# Patient Record
Sex: Male | Born: 1945 | Race: White | Hispanic: No | Marital: Single | Smoking: Current every day smoker
Health system: Southern US, Community
[De-identification: ages and names within clinical notes are randomized; demographics above are authoritative.]

## PROBLEM LIST (undated history)

## (undated) DIAGNOSIS — Z720 Tobacco use: Secondary | ICD-10-CM

## (undated) DIAGNOSIS — F32A Depression, unspecified: Secondary | ICD-10-CM

## (undated) DIAGNOSIS — I77819 Aortic ectasia, unspecified site: Secondary | ICD-10-CM

## (undated) DIAGNOSIS — I2510 Atherosclerotic heart disease of native coronary artery without angina pectoris: Secondary | ICD-10-CM

## (undated) DIAGNOSIS — I10 Essential (primary) hypertension: Secondary | ICD-10-CM

## (undated) DIAGNOSIS — I350 Nonrheumatic aortic (valve) stenosis: Secondary | ICD-10-CM

## (undated) DIAGNOSIS — E785 Hyperlipidemia, unspecified: Secondary | ICD-10-CM

## (undated) DIAGNOSIS — I255 Ischemic cardiomyopathy: Secondary | ICD-10-CM

## (undated) DIAGNOSIS — E119 Type 2 diabetes mellitus without complications: Secondary | ICD-10-CM

## (undated) DIAGNOSIS — J449 Chronic obstructive pulmonary disease, unspecified: Secondary | ICD-10-CM

## (undated) DIAGNOSIS — I480 Paroxysmal atrial fibrillation: Secondary | ICD-10-CM

## (undated) DIAGNOSIS — I509 Heart failure, unspecified: Secondary | ICD-10-CM

## (undated) DIAGNOSIS — N183 Chronic kidney disease, stage 3 unspecified: Secondary | ICD-10-CM

## (undated) DIAGNOSIS — K219 Gastro-esophageal reflux disease without esophagitis: Secondary | ICD-10-CM

## (undated) DIAGNOSIS — F329 Major depressive disorder, single episode, unspecified: Secondary | ICD-10-CM

## (undated) DIAGNOSIS — I739 Peripheral vascular disease, unspecified: Secondary | ICD-10-CM

## (undated) DIAGNOSIS — E0590 Thyrotoxicosis, unspecified without thyrotoxic crisis or storm: Secondary | ICD-10-CM

## (undated) DIAGNOSIS — I5022 Chronic systolic (congestive) heart failure: Secondary | ICD-10-CM

## (undated) DIAGNOSIS — I77810 Thoracic aortic ectasia: Secondary | ICD-10-CM

## (undated) HISTORY — DX: Thoracic aortic ectasia: I77.810

## (undated) HISTORY — PX: CHOLECYSTECTOMY: SHX55

## (undated) HISTORY — DX: Heart failure, unspecified: I50.9

## (undated) HISTORY — DX: Ischemic cardiomyopathy: I25.5

## (undated) HISTORY — DX: Nonrheumatic aortic (valve) stenosis: I35.0

## (undated) HISTORY — PX: COLONOSCOPY: SHX174

## (undated) HISTORY — PX: CARDIAC CATHETERIZATION: SHX172

## (undated) HISTORY — DX: Aortic ectasia, unspecified site: I77.819

## (undated) HISTORY — PX: OTHER SURGICAL HISTORY: SHX169

## (undated) HISTORY — PX: CORONARY ARTERY BYPASS GRAFT: SHX141

## (undated) HISTORY — DX: Chronic systolic (congestive) heart failure: I50.22

---

## 2019-01-26 ENCOUNTER — Emergency Department: Payer: Medicare Other

## 2019-01-26 ENCOUNTER — Inpatient Hospital Stay
Admission: EM | Admit: 2019-01-26 | Discharge: 2019-01-28 | DRG: 303 | Disposition: A | Discharge status: Skilled Nursing Facility | Payer: Medicare Other | Source: Skilled Nursing Facility | Attending: Internal Medicine | Admitting: Internal Medicine

## 2019-01-26 ENCOUNTER — Other Ambulatory Visit: Payer: Self-pay

## 2019-01-26 ENCOUNTER — Observation Stay (HOSPITAL_BASED_OUTPATIENT_CLINIC_OR_DEPARTMENT_OTHER)
Admit: 2019-01-26 | Discharge: 2019-01-26 | Disposition: A | Discharge status: Home / Self Care | Payer: Medicare Other | Attending: Internal Medicine | Admitting: Internal Medicine

## 2019-01-26 DIAGNOSIS — D649 Anemia, unspecified: Secondary | ICD-10-CM | POA: Diagnosis present

## 2019-01-26 DIAGNOSIS — Z89612 Acquired absence of left leg above knee: Secondary | ICD-10-CM

## 2019-01-26 DIAGNOSIS — N1831 Chronic kidney disease, stage 3a: Secondary | ICD-10-CM

## 2019-01-26 DIAGNOSIS — J449 Chronic obstructive pulmonary disease, unspecified: Secondary | ICD-10-CM | POA: Diagnosis present

## 2019-01-26 DIAGNOSIS — N289 Disorder of kidney and ureter, unspecified: Secondary | ICD-10-CM

## 2019-01-26 DIAGNOSIS — N183 Chronic kidney disease, stage 3 (moderate): Secondary | ICD-10-CM | POA: Diagnosis present

## 2019-01-26 DIAGNOSIS — E1122 Type 2 diabetes mellitus with diabetic chronic kidney disease: Secondary | ICD-10-CM | POA: Diagnosis present

## 2019-01-26 DIAGNOSIS — R079 Chest pain, unspecified: Secondary | ICD-10-CM

## 2019-01-26 DIAGNOSIS — F17210 Nicotine dependence, cigarettes, uncomplicated: Secondary | ICD-10-CM | POA: Diagnosis present

## 2019-01-26 DIAGNOSIS — E1151 Type 2 diabetes mellitus with diabetic peripheral angiopathy without gangrene: Secondary | ICD-10-CM | POA: Diagnosis present

## 2019-01-26 DIAGNOSIS — F419 Anxiety disorder, unspecified: Secondary | ICD-10-CM | POA: Diagnosis present

## 2019-01-26 DIAGNOSIS — I252 Old myocardial infarction: Secondary | ICD-10-CM

## 2019-01-26 DIAGNOSIS — R001 Bradycardia, unspecified: Secondary | ICD-10-CM | POA: Diagnosis present

## 2019-01-26 DIAGNOSIS — Z7982 Long term (current) use of aspirin: Secondary | ICD-10-CM

## 2019-01-26 DIAGNOSIS — Z7984 Long term (current) use of oral hypoglycemic drugs: Secondary | ICD-10-CM

## 2019-01-26 DIAGNOSIS — E876 Hypokalemia: Secondary | ICD-10-CM | POA: Diagnosis present

## 2019-01-26 DIAGNOSIS — Z79899 Other long term (current) drug therapy: Secondary | ICD-10-CM

## 2019-01-26 DIAGNOSIS — Z951 Presence of aortocoronary bypass graft: Secondary | ICD-10-CM

## 2019-01-26 DIAGNOSIS — E785 Hyperlipidemia, unspecified: Secondary | ICD-10-CM | POA: Diagnosis present

## 2019-01-26 DIAGNOSIS — K219 Gastro-esophageal reflux disease without esophagitis: Secondary | ICD-10-CM | POA: Diagnosis present

## 2019-01-26 DIAGNOSIS — R0789 Other chest pain: Secondary | ICD-10-CM

## 2019-01-26 DIAGNOSIS — Z7951 Long term (current) use of inhaled steroids: Secondary | ICD-10-CM

## 2019-01-26 DIAGNOSIS — Z7901 Long term (current) use of anticoagulants: Secondary | ICD-10-CM

## 2019-01-26 DIAGNOSIS — I25118 Atherosclerotic heart disease of native coronary artery with other forms of angina pectoris: Principal | ICD-10-CM | POA: Diagnosis present

## 2019-01-26 DIAGNOSIS — F329 Major depressive disorder, single episode, unspecified: Secondary | ICD-10-CM | POA: Diagnosis present

## 2019-01-26 DIAGNOSIS — I480 Paroxysmal atrial fibrillation: Secondary | ICD-10-CM | POA: Diagnosis present

## 2019-01-26 DIAGNOSIS — I129 Hypertensive chronic kidney disease with stage 1 through stage 4 chronic kidney disease, or unspecified chronic kidney disease: Secondary | ICD-10-CM | POA: Diagnosis present

## 2019-01-26 DIAGNOSIS — Z8249 Family history of ischemic heart disease and other diseases of the circulatory system: Secondary | ICD-10-CM

## 2019-01-26 HISTORY — DX: Paroxysmal atrial fibrillation: I48.0

## 2019-01-26 HISTORY — DX: Chronic obstructive pulmonary disease, unspecified: J44.9

## 2019-01-26 HISTORY — DX: Tobacco use: Z72.0

## 2019-01-26 HISTORY — DX: Gastro-esophageal reflux disease without esophagitis: K21.9

## 2019-01-26 HISTORY — DX: Depression, unspecified: F32.A

## 2019-01-26 HISTORY — DX: Type 2 diabetes mellitus without complications: E11.9

## 2019-01-26 HISTORY — DX: Atherosclerotic heart disease of native coronary artery without angina pectoris: I25.10

## 2019-01-26 HISTORY — DX: Peripheral vascular disease, unspecified: I73.9

## 2019-01-26 HISTORY — DX: Chronic kidney disease, stage 3 unspecified: N18.30

## 2019-01-26 HISTORY — DX: Hyperlipidemia, unspecified: E78.5

## 2019-01-26 HISTORY — DX: Chronic kidney disease, stage 3 (moderate): N18.3

## 2019-01-26 HISTORY — DX: Thyrotoxicosis, unspecified without thyrotoxic crisis or storm: E05.90

## 2019-01-26 HISTORY — DX: Essential (primary) hypertension: I10

## 2019-01-26 HISTORY — DX: Major depressive disorder, single episode, unspecified: F32.9

## 2019-01-26 LAB — CBC
HCT: 39.9 % (ref 39.0–52.0)
Hemoglobin: 13 g/dL (ref 13.0–17.0)
MCH: 28.6 pg (ref 26.0–34.0)
MCHC: 32.6 g/dL (ref 30.0–36.0)
MCV: 87.7 fL (ref 80.0–100.0)
Platelets: 221 10*3/uL (ref 150–400)
RBC: 4.55 MIL/uL (ref 4.22–5.81)
RDW: 14.7 % (ref 11.5–15.5)
WBC: 10 10*3/uL (ref 4.0–10.5)
nRBC: 0 % (ref 0.0–0.2)

## 2019-01-26 LAB — BASIC METABOLIC PANEL
Anion gap: 10 (ref 5–15)
BUN: 20 mg/dL (ref 8–23)
CO2: 24 mmol/L (ref 22–32)
Calcium: 8.5 mg/dL — ABNORMAL LOW (ref 8.9–10.3)
Chloride: 105 mmol/L (ref 98–111)
Creatinine, Ser: 1.29 mg/dL — ABNORMAL HIGH (ref 0.61–1.24)
GFR calc Af Amer: >60 mL/min (ref >60)
GFR calc non Af Amer: 54 mL/min — ABNORMAL LOW (ref >60)
Glucose, Bld: 199 mg/dL — ABNORMAL HIGH (ref 70–99)
Potassium: 3.6 mmol/L (ref 3.5–5.1)
Sodium: 139 mmol/L (ref 135–145)

## 2019-01-26 LAB — TROPONIN I
Troponin I: <0.03 ng/mL (ref <0.03)
Troponin I: <0.03 ng/mL (ref <0.03)
Troponin I: <0.03 ng/mL (ref <0.03)

## 2019-01-26 LAB — GLUCOSE, CAPILLARY
Glucose-Capillary: 99 mg/dL (ref 70–99)
Glucose-Capillary: 200 mg/dL — ABNORMAL HIGH (ref 70–99)

## 2019-01-26 LAB — ECHOCARDIOGRAM COMPLETE
Height: 72 in
Weight: 2656 oz

## 2019-01-26 MED ORDER — ASPIRIN 300 MG RE SUPP: 300.0000 mg | RECTAL | Status: DC

## 2019-01-26 MED ORDER — MORPHINE SULFATE (PF) 2 MG/ML IV SOLN
2.0000 mg | Freq: Once | INTRAVENOUS | Status: AC
  Administered 2019-01-26: 14:00:00 2 mg via INTRAVENOUS

## 2019-01-26 MED ORDER — INSULIN ASPART 100 UNIT/ML ~~LOC~~ SOLN
0.0000 [IU] | Freq: Every day | SUBCUTANEOUS | Status: DC
  Filled 2019-01-26: qty 1

## 2019-01-26 MED ORDER — NITROGLYCERIN 2 % TD OINT
1.0000 [in_us] | TOPICAL_OINTMENT | Freq: Once | TRANSDERMAL | Status: AC
  Administered 2019-01-26: 1 [in_us] via TOPICAL
  Filled 2019-01-26: qty 1

## 2019-01-26 MED ORDER — ASPIRIN EC 81 MG PO TBEC
81.0000 mg | DELAYED_RELEASE_TABLET | Freq: Every day | ORAL | Status: DC
  Administered 2019-01-27: 81 mg via ORAL
  Filled 2019-01-26: qty 1

## 2019-01-26 MED ORDER — INSULIN ASPART 100 UNIT/ML ~~LOC~~ SOLN
0.0000 [IU] | Freq: Three times a day (TID) | SUBCUTANEOUS | Status: DC
  Administered 2019-01-27 – 2019-01-28 (×4): 2 [IU] via SUBCUTANEOUS
  Filled 2019-01-26 (×4): qty 1

## 2019-01-26 MED ORDER — MORPHINE SULFATE (PF) 2 MG/ML IV SOLN
INTRAVENOUS | Status: AC
  Administered 2019-01-26: 2 mg via INTRAVENOUS
  Filled 2019-01-26: qty 1

## 2019-01-26 MED ORDER — ONDANSETRON HCL 4 MG/2ML IJ SOLN: 4.0000 mg | Freq: Four times a day (QID) | INTRAMUSCULAR | Status: DC | PRN

## 2019-01-26 MED ORDER — SODIUM CHLORIDE 0.9 % IV SOLN: 250.0000 mL | INTRAVENOUS | Status: DC | PRN

## 2019-01-26 MED ORDER — ASPIRIN 81 MG PO CHEW
324.0000 mg | CHEWABLE_TABLET | Freq: Once | ORAL | Status: AC
  Administered 2019-01-26: 12:00:00 324 mg via ORAL
  Filled 2019-01-26: qty 4

## 2019-01-26 MED ORDER — SODIUM CHLORIDE 0.9% FLUSH
3.0000 mL | Freq: Two times a day (BID) | INTRAVENOUS | Status: DC
  Administered 2019-01-26 – 2019-01-28 (×3): 3 mL via INTRAVENOUS

## 2019-01-26 MED ORDER — SODIUM CHLORIDE 0.9% FLUSH: 3.0000 mL | INTRAVENOUS | Status: DC | PRN

## 2019-01-26 MED ORDER — ASPIRIN 81 MG PO CHEW: 324.0000 mg | CHEWABLE_TABLET | ORAL | Status: DC

## 2019-01-26 MED ORDER — ENOXAPARIN SODIUM 40 MG/0.4ML ~~LOC~~ SOLN
40.0000 mg | SUBCUTANEOUS | Status: DC
  Administered 2019-01-26 – 2019-01-27 (×2): 40 mg via SUBCUTANEOUS
  Filled 2019-01-26 (×2): qty 0.4

## 2019-01-26 MED ORDER — ATORVASTATIN CALCIUM 20 MG PO TABS
80.0000 mg | ORAL_TABLET | Freq: Every day | ORAL | Status: DC
  Administered 2019-01-26: 80 mg via ORAL
  Filled 2019-01-26: qty 4

## 2019-01-26 MED ORDER — NITROGLYCERIN 0.4 MG SL SUBL: 0.4000 mg | SUBLINGUAL_TABLET | SUBLINGUAL | Status: DC | PRN

## 2019-01-26 MED ORDER — ACETAMINOPHEN 325 MG PO TABS: 650.0000 mg | ORAL_TABLET | ORAL | Status: DC | PRN

## 2019-01-26 NOTE — Consult Note (Addendum)
Cardiology Consult    Patient ID: Shawn Jennings MRN: [id number], DOB/AGE: 1945/11/18   Admit date: 01/26/2019 Date of Consult: 01/26/2019  Primary Physician: Orvis Brill, [facility] Primary Cardiologist: [name], MD - new Requesting Provider: Mamie Nick Pyreddy  Patient Profile    Shawn Jennings is a 73 y.o. male with a history of CAD s/p CABG in 1994, chronic chest pain, HTN, HL, ? PAF, PAD s/p L AKA, DMII, GERD, and depression, who is being seen today for the evaluation of chest pain at the request of Dr. Estanislado Pandy.  Past Medical History   Past Medical History:  Diagnosis Date   CAD (coronary artery disease)    a. 1994 s/p CABG ([place], [place]); b. Reports multiple stress tests over the years.   CKD (chronic kidney disease), stage III (HCC)    COPD (chronic obstructive pulmonary disease) (HCC)    Depression    Essential hypertension    GERD (gastroesophageal reflux disease)    Hyperlipidemia LDL goal <70    PAD (peripheral artery disease) ([place])    a. 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   PAF (paroxysmal atrial fibrillation) (HCC)    a. CHA2DS2VASc = 4-->Xarelto 15mg  daily in setting of CKD.   Thyrotoxicosis    Tobacco abuse    Type II diabetes mellitus ([place])     Past Surgical History:  Procedure Laterality Date   CORONARY ARTERY BYPASS GRAFT     a. [place], [place]   Left AKA      Allergies  No Known Allergies  History of Present Illness    73 y/o ? with a h/o CAD s/p remote CABG in [place], [place] in 1994.  Other history includes HTN, HL, DMII, COPD, tobacco abuse, possibly PAF (he isn't sure but is on xarelto), PAD s/p L AKA, GERD, depression, and CKD.  He says that he has a long h/o chest pain and has had many stress tests over the years but has never required a repeat catheterization.  He continues to smoke 1 ppd and lives in an assisted living in [place], [place].  He initially told me that he had a stress test in [place] ~ 2 months ago, just prior to moving to [facility].  Upon further  questioning however, he says that he moved to [place] ~ 9 yrs ago.  He thinks he had a stress test 2 mos ago but he doesn't know where he might have had it.  No records have been found from any of our other local health systems in [place] (none from [place], [place] either).  Pt is w/c bound.  He notes that he is accustomed to experiencing chest pressure at least once/wk, usually while lying in bed, sometimes assoc w/ dyspnea.  He often uses sl ntg, and says that Ss typically last about 64mins after using ntg.  This AM, he awoke @ 4am w/ severe sscp/pressure, assoc w/ dyspnea.  He says that he alerted staff @ the assisted living facility. However, EMS was not called until about 11am per notes.  Pt says he's had pain since 4am, however ER note indicates that he was pain free in the ED.  Further, ED nurse spoke w/ staff @ [facility], who reported reason for ED visit was cough, dyspnea, and fever.  He was given ASA 324mg  in the ED along w/ ntp and morphine.  ECG w/ lateral TWI (no old for comparison).  Initial trop nl.  He was admitted for further eval. He says pain is still 7/10 and  has now been present for ~ 12 hrs.  He is in no distress and overall good spirits.    Inpatient Medications     [START ON 01/27/2019] aspirin EC  81 mg Oral Daily   enoxaparin (LOVENOX) injection  40 mg Subcutaneous Q24H   insulin aspart  0-15 Units Subcutaneous TID WC   insulin aspart  0-5 Units Subcutaneous QHS   sodium chloride flush  3 mL Intravenous Q12H    Family History    Family History  Problem Relation Age of Onset   Heart attack Mother        died @ 58.   Other Father        never knew his father.   Other Sister        overdose of sleeping pills.   Heart disease Brother        died in his 1's   Other Sister        complication of abd surgery @ 4   CAD Sister    He indicated that his mother is deceased. He indicated that his father is deceased. He indicated that only one of his three sisters  is alive. He indicated that his brother is deceased.   Social History    Social History   Socioeconomic History   Marital status: Single    Spouse name: Not on file   Number of children: Not on file   Years of education: Not on file   Highest education level: Not on file  Occupational History   Not on file  Social Needs   Financial resource strain: Not on file   Food insecurity:    Worry: Not on file    Inability: Not on file   Transportation needs:    Medical: Not on file    Non-medical: Not on file  Tobacco Use   Smoking status: Current Every Day Smoker    Packs/day: 1.00    Years: 62.00    Pack years: 62.00   Smokeless tobacco: Never Used  Substance and Sexual Activity   Alcohol use: Never    Frequency: Never   Drug use: Never   Sexual activity: Not on file  Lifestyle   Physical activity:    Days per week: Not on file    Minutes per session: Not on file   Stress: Not on file  Relationships   Social connections:    Talks on phone: Not on file    Gets together: Not on file    Attends religious service: Not on file    Active member of club or organization: Not on file    Attends meetings of clubs or organizations: Not on file    Relationship status: Not on file   Intimate partner violence:    Fear of current or ex partner: Not on file    Emotionally abused: Not on file    Physically abused: Not on file    Forced sexual activity: Not on file  Other Topics Concern   Not on file  Social History Narrative   Retired from [employer].  From [place], [place]. Moved to [place] ~ 2011 (pt initially said that he moved here in Feb 2020, but then said 9 yrs ago).     Review of Systems    General:  No chills, fever, night sweats or weight changes.  Cardiovascular:  +++ chest pain, +++ dyspnea, no edema, orthopnea, palpitations, paroxysmal nocturnal dyspnea. Dermatological: No rash, lesions/masses Respiratory: No cough, +++ dyspnea in setting  of c/p this  AM. Urologic: No hematuria, dysuria Abdominal:   No nausea, vomiting, diarrhea, bright red blood per rectum, melena, or hematemesis Neurologic:  No visual changes, wkns, changes in mental status. All other systems reviewed and are otherwise negative except as noted above.  Physical Exam    Blood pressure 111/70, pulse 65, temperature 97.9 F (36.6 C), temperature source Oral, resp. rate 18, height 6' (1.829 m), weight 75.3 kg, SpO2 99 %.  General: Pleasant, NAD Psych: Normal affect. Neuro: Alert and oriented X 3 but offers different answers to the same questions re: timing of events. Moves all extremities spontaneously. HEENT: Normal  Neck: Supple without bruits or JVD. Lungs:  Resp regular and unlabored, bibasilar crackles w/ diminished breath sounds and scatt rhonchi throughout. Heart: RRR no s3, s4. 2/6 syst murmur heard throughout.. Abdomen: Flat, Soft, non-tender, non-distended, BS + x 4.  Extremities: No clubbing, cyanosis or edema. L AKA.  R PT1+.  Labs    Troponin  Recent Labs    01/26/19 1202  TROPONINI <0.03   Lab Results  Component Value Date   WBC 10.0 01/26/2019   HGB 13.0 01/26/2019   HCT 39.9 01/26/2019   MCV 87.7 01/26/2019   PLT 221 01/26/2019    Recent Labs  Lab 01/26/19 1202  NA 139  K 3.6  CL 105  CO2 24  BUN 20  CREATININE 1.29*  CALCIUM 8.5*  GLUCOSE 199*    Radiology Studies    Dg Chest Port 1 View  Result Date: 01/26/2019 CLINICAL DATA:  Chest pain since this morning which has resolved. Diarrhea yesterday. EXAM: PORTABLE CHEST 1 VIEW COMPARISON:  01/18/2004 FINDINGS: Lungs are adequately inflated without lobar consolidation or effusion. Mild prominence of the central bronchovascular markings which may be due to an acute bronchitic process. Cardiomediastinal silhouette and remainder of the exam is unchanged. IMPRESSION: Minimal prominence of the central bronchovascular markings which may be due to an acute bronchitic process. Electronically  Signed   By: Marin Olp M.D.   On: 01/26/2019 12:21    ECG & Cardiac Imaging    RSR, 73, LAE, lateral TWI - personally reviewed.  Assessment & Plan    1.  Retrosternal Chest Pain/CAD: Pt w/ a h/o CAD s/p remote CABG in [place], [place] in 1994.  He says that he has a long h/o chest pain, at least once/wk, @ rest, lasting 30 mins, and not particularly changed w/ sl ntg.  This is sometimes assoc w/ dyspnea.  He is otw w/c bound and does not exert himself.  He says that he has had many stress tests over the years but has never required a repeat cath.  He also says that he recently had a stress test ~ 2 mos ago, but initially said it was in [place], and later said it was done locally, but doesn't know where.  No records of any recent ER or hospital visits @ other local health care systems.  He presented w/ chest pain this AM.  He tells me that it started @ 4am and has been constant since, though ED notes indicate that he presented after a 1 hr episode of chest pain that had resolved prior to arrival.  Nsg note indicates that reason for presentation was cough, fever, and dyspnea noted by ALF staff after awakening.  Regardless, despite reports of prolonged c/p, trop is currently nl.  Cont to trend troponin.  If this remains nl, it is reasonable to consider stress testing, as it is  not clear that the patient has actually undergone any cardiology evaluation recently.  Cont asa for now, though was on xarelto @ home.  2.  Essential HTN:  Stable.  Appears that he takes lasix @ home for this.  3.  HL:  On statin @ home  resume.  4.  PAD: s/p L AKA.  Resume statin.  5.  ? PAF:  Pt on xarelto 15 daily.  He doesn't know why but thinks he's been told that he's had afib.  Currently on hold in setting of chest pain admission.  On lovenox.  6.  DMII:  SSi per IM.  7.  Dementia:  Pt changes story frequently and timelines are difficult to follow.  If echo ok and no objective evidence of ischemia, he may best be served  with conservative mgmt.  8. COPD/Tob abuse:  Unclear if he is still smoking as he lives at an assisted living facility but reports 1ppd usage.  Cont inhalers.  9.  CKD (? III): pt says he's been told that his kidney's aren't nl.  Creat 1.29 today.  Follow.  Signed, Murray Hodgkins, NP 01/26/2019, 3:59 PM  For questions or updates, please contact   Please consult [email or web address] for contact info under Cardiology/STEMI.

## 2019-01-26 NOTE — ED Notes (Signed)
Spoke with nurse care coordinator from [facility] who expressed concern of cough, SOB, and fever with tylenol taken at 7 or 8am PT denies cough/SOB/CP. No cough or SOB noted. PT is afebrile upon arrival.  MD made aware of these concerns.

## 2019-01-26 NOTE — ED Notes (Signed)
ED TO INPATIENT HANDOFF REPORT  ED Nurse Name and Phone #: Jawon Dipiero [phone]  S Name/Age/Gender Shawn Jennings 73 y.o. male Room/Bed: ED09A/ED09A  Code Status   Code Status: Not on file  Home/SNF/Other Skilled nursing facility Patient oriented to: self, place, time and situation Is this baseline? Yes   Triage Complete: Triage complete  Chief Complaint chest pain ems  Triage Note PT to ED via EMS from [facility]. PT had episode of CP for about 1 hour this morning, that has completely resolved. PT had one episode of diarrhea yesterday. No SOB/cough. PT AO in NAD.    Allergies No Known Allergies  Level of Care/Admitting Diagnosis ED Disposition    ED Disposition Condition [facility] Area: [place] [[id number]]  Level of Care: Telemetry [5]  Diagnosis: Chest pain [161096]  Admitting Physician: Saundra Shelling [[id number]]  Attending Physician: Saundra Shelling [[id number]]  PT Class (Do Not Modify): Observation [104]  PT Acc Code (Do Not Modify): Observation [10022]       B Medical/Surgery History Past Medical History:  Diagnosis Date  . Depression   . Diabetes mellitus without complication ([place])   . GERD (gastroesophageal reflux disease)   . Thyrotoxicosis       A IV Location/Drains/Wounds Patient Lines/Drains/Airways Status   Active Line/Drains/Airways    None          Intake/Output Last 24 hours No intake or output data in the 24 hours ending 01/26/19 1358  Labs/Imaging Results for orders placed or performed during the hospital encounter of 01/26/19 (from the past 48 hour(s))  Basic metabolic panel     Status: Abnormal   Collection Time: 01/26/19 12:02 PM  Result Value Ref Range   Sodium 139 135 - 145 mmol/L   Potassium 3.6 3.5 - 5.1 mmol/L   Chloride 105 98 - 111 mmol/L   CO2 24 22 - 32 mmol/L   Glucose, Bld 199 (H) 70 - 99 mg/dL   BUN 20 8 - 23 mg/dL   Creatinine, Ser 1.29 (H) 0.61 - 1.24 mg/dL   Calcium 8.5 (L) 8.9 -  10.3 mg/dL   GFR calc non Af Amer 54 (L) >60 mL/min   GFR calc Af Amer >60 >60 mL/min   Anion gap 10 5 - 15    Comment: Performed at [facility], [address] [place], [place] [address]  CBC     Status: None   Collection Time: 01/26/19 12:02 PM  Result Value Ref Range   WBC 10.0 4.0 - 10.5 K/uL   RBC 4.55 4.22 - 5.81 MIL/uL   Hemoglobin 13.0 13.0 - 17.0 g/dL   HCT 39.9 39.0 - 52.0 %   MCV 87.7 80.0 - 100.0 fL   MCH 28.6 26.0 - 34.0 pg   MCHC 32.6 30.0 - 36.0 g/dL   RDW 14.7 11.5 - 15.5 %   Platelets 221 150 - 400 K/uL   nRBC 0.0 0.0 - 0.2 %    Comment: Performed at [facility], [address] [place], [place] [address]  Troponin I - Once     Status: None   Collection Time: 01/26/19 12:02 PM  Result Value Ref Range   Troponin I <0.03 <0.03 ng/mL    Comment: Performed at [facility], [address], [place], [place] [address]   Dg Chest Port 1 View  Result Date: 01/26/2019 CLINICAL DATA:  Chest pain since this morning which has resolved. Diarrhea yesterday. EXAM: PORTABLE CHEST  1 VIEW COMPARISON:  01/18/2004 FINDINGS: Lungs are adequately inflated without lobar consolidation or effusion. Mild prominence of the central bronchovascular markings which may be due to an acute bronchitic process. Cardiomediastinal silhouette and remainder of the exam is unchanged. IMPRESSION: Minimal prominence of the central bronchovascular markings which may be due to an acute bronchitic process. Electronically Signed   By: Marin Olp M.D.   On: 01/26/2019 12:21    Pending Labs [employer] (From admission, onward)    Start     Ordered   Signed and Held  CBC  (enoxaparin (LOVENOX)    CrCl >/= 30 ml/min)  Once,   R    Comments:  Baseline for enoxaparin therapy IF NOT ALREADY DRAWN.  Notify MD if PLT < 100 K.    Signed and Held   Signed and Held  Creatinine, serum  (enoxaparin (LOVENOX)    CrCl >/= 30 ml/min)  Once,   R    Comments:  Baseline for enoxaparin  therapy IF NOT ALREADY DRAWN.    Signed and Held   Signed and Held  Creatinine, serum  (enoxaparin (LOVENOX)    CrCl >/= 30 ml/min)  Weekly,   R    Comments:  while on enoxaparin therapy    Signed and Held   Signed and Held  Troponin I - Now Then Q6H  Now then every 6 hours,   STAT     Signed and Held   Signed and Held  Basic metabolic panel  Tomorrow morning,   R     Signed and Held   Signed and Held  Lipid panel  Tomorrow morning,   R     Signed and Held   Signed and Held  CBC  Tomorrow morning,   R     Signed and Held          Vitals/Pain Today's Vitals   01/26/19 1204 01/26/19 1205  BP: 114/61   Pulse: 70   Resp: 18   Temp: 98.1 F (36.7 C)   TempSrc: Oral   SpO2: 95%   Weight:  75.3 kg  Height:  6' (1.829 m)  PainSc:  0-No pain    Isolation Precautions No active isolations  Medications Medications  aspirin chewable tablet 324 mg (324 mg Oral Given 01/26/19 1222)  nitroGLYCERIN (NITROGLYN) 2 % ointment 1 inch (1 inch Topical Given 01/26/19 1222)    Mobility manual wheelchair Low fall risk   Focused Assessments Cardiac Assessment Handoff:  Cardiac Rhythm: Normal sinus rhythm Lab Results  Component Value Date   TROPONINI <0.03 01/26/2019   No results found for: DDIMER Does the Patient currently have chest pain? Yes     R Recommendations: See Admitting Provider Note  Report given to:   Additional Notes:  g

## 2019-01-26 NOTE — Progress Notes (Signed)
*  PRELIMINARY RESULTS* Echocardiogram 2D Echocardiogram has been performed.  Shawn Jennings 01/26/2019, 4:51 PM

## 2019-01-26 NOTE — ED Triage Notes (Signed)
PT to ED via EMS from [facility]. PT had episode of CP for about 1 hour this morning, that has completely resolved. PT had one episode of diarrhea yesterday. No SOB/cough. PT AO in NAD.

## 2019-01-26 NOTE — ED Provider Notes (Signed)
[facility] Emergency Department Provider Note       Time seen: ----------------------------------------- 12:02 PM on 01/26/2019 -----------------------------------------   I have reviewed the triage vital signs and the nursing notes.  HISTORY   Chief Complaint Chest Pain    HPI Shawn Jennings is a 73 y.o. male with a history of MI and CABG who presents to the ED for chest pain.  Patient describes acute onset chest pain today with shortness of breath.  He has not had sweats or nausea.  Patient reports pain with his previously associated with an MI.  He has not had aspirin or nitroglycerin today.  Currently chest pain appears to be resolved.  No past medical history on file.  There are no active problems to display for this patient.   Allergies Patient has no allergy information on record.  Social History Social History   Tobacco Use  . Smoking status: Not on file  Substance Use Topics  . Alcohol use: Not on file  . Drug use: Not on file    Review of Systems Constitutional: Negative for fever. Cardiovascular: Positive for chest pain Respiratory: Positive for shortness of breath Gastrointestinal: Negative for abdominal pain, vomiting and diarrhea. Musculoskeletal: Negative for back pain. Skin: Negative for rash. Neurological: Negative for headaches, focal weakness or numbness.  All systems negative/normal/unremarkable except as stated in the HPI  ____________________________________________   PHYSICAL EXAM:  VITAL SIGNS: ED Triage Vitals  Enc Vitals Group     BP      Pulse      Resp      Temp      Temp src      SpO2      Weight      Height      Head Circumference      Peak Flow      Pain Score      Pain Loc      Pain Edu?      Excl. in [place]?    Constitutional: Alert and oriented. Well appearing and in no distress. Eyes: Conjunctivae are normal. Normal extraocular movements. ENT      Head: Normocephalic and atraumatic.       Nose: No congestion/rhinnorhea.      Mouth/Throat: Mucous membranes are moist.      Neck: No stridor. Cardiovascular: Normal rate, regular rhythm. No murmurs, rubs, or gallops. Respiratory: Normal respiratory effort without tachypnea nor retractions. Breath sounds are clear and equal bilaterally. No wheezes/rales/rhonchi. Gastrointestinal: Soft and nontender. Normal bowel sounds Musculoskeletal: Nontender with normal range of motion in extremities. No lower extremity tenderness nor edema. Neurologic:  Normal speech and language. No gross focal neurologic deficits are appreciated.  Skin:  Skin is warm, dry and intact. No rash noted. Psychiatric: Mood and affect are normal. Speech and behavior are normal.  ____________________________________________  EKG: Interpreted by me.  Sinus rhythm rate of 73 bpm, normal PR interval, normal axis, normal QT  ____________________________________________  ED COURSE:  As part of my medical decision making, I reviewed the following data within the [place] History obtained from family if available, nursing notes, old chart and ekg, as well as notes from prior ED visits. Patient presented for chest pain and shortness of breath, we will assess with labs and imaging as indicated at this time.   Procedures Sevan Mcbroom was evaluated in Emergency Department on 01/26/2019 for the symptoms described in the history of present illness. He was evaluated in the context of the  global COVID-19 pandemic, which necessitated consideration that the patient might be at risk for infection with the SARS-CoV-2 virus that causes COVID-19. Institutional protocols and algorithms that pertain to the evaluation of patients at risk for COVID-19 are in a state of rapid change based on information released by regulatory bodies including the CDC and federal and state organizations. These policies and algorithms were followed during the patient's care in the ED.   ____________________________________________   LABS (pertinent positives/negatives)  Labs Reviewed  BASIC METABOLIC PANEL - Abnormal; Notable for the following components:      Result Value   Glucose, Bld 199 (*)    Creatinine, Ser 1.29 (*)    Calcium 8.5 (*)    GFR calc non Af Amer 54 (*)    All other components within normal limits  CBC  TROPONIN I    RADIOLOGY Images were viewed by me  Chest x-ray IMPRESSION: Minimal prominence of the central bronchovascular markings which may be due to an acute bronchitic process. ____________________________________________   DIFFERENTIAL DIAGNOSIS   Unstable angina, MI, PE, pneumothorax, pneumonia  FINAL ASSESSMENT AND PLAN  Chest pain, concerns for unstable angina   Plan: The patient had presented for chest pain resembling his prior MI. Patient's labs did not reveal any acute process. Patient's imaging was negative.  Patient has not had any respiratory symptoms, I feel he needs to be ruled out from a cardiac perspective given that his chest pain mimics his previous heart attack.  I will discuss with the hospitalist for admission.  He received aspirin and topical nitroglycerin here.   Laurence Aly, MD    Note: This note was generated in part or whole with voice recognition software. Voice recognition is usually quite accurate but there are transcription errors that can and very often do occur. I apologize for any typographical errors that were not detected and corrected.     Earleen Newport, MD 01/26/19 [phone]

## 2019-01-26 NOTE — Progress Notes (Signed)
Advanced care plan. Purpose of the Encounter: CODE STATUS Parties in Attendance: Patient Patient's Decision Capacity: Good Subjective/Patient's story: Shawn Jennings  is a 73 y.o. male with a known history of GERD, diabetes mellitus type 2, depression presented to the emergency room for chest pain.  Patient has history of MI and CABG.  Objective/Medical story Patient needs telemetry monitoring and cardiac work-up.  Needs cardiology evaluation Needs serial troponin and echocardiogram Goals of care determination:  Advance care directives goals of care and treatment plan discussed Patient wants everything done which includes CPR, intubation ventilator if the need arises CODE STATUS: Full code Time spent discussing advanced care planning: 16 minutes

## 2019-01-26 NOTE — H&P (Addendum)
[facility] [place] NAME: Shawn Jennings    MR#:  [id number]  DATE OF BIRTH:  May 13, 1946  DATE OF ADMISSION:  01/26/2019  PRIMARY CARE PHYSICIAN: [facility]   REQUESTING/REFERRING PHYSICIAN:   CHIEF COMPLAINT:   Chief Complaint  Patient presents with  . Chest Pain    HISTORY OF PRESENT ILLNESS: Shawn Jennings  is a 73 y.o. male with a known history of GERD, diabetes mellitus type 2, depression presented to the emergency room for chest pain.  Patient has history of MI and CABG. pain is located retrosternally 5 out of 10 on a scale of 1-10 sharp in nature.  First set of troponin is negative.  Hospitalist service was consulted.  PAST MEDICAL HISTORY:   Past Medical History:  Diagnosis Date  . Depression   . Diabetes mellitus without complication ([place])   . GERD (gastroesophageal reflux disease)   . Thyrotoxicosis   Hyperlipidemia Coronary artery disease Myocardial infarction CABG  PAST SURGICAL HISTORY: CABG  SOCIAL HISTORY:  Social History   Tobacco Use  . Smoking status: Smoker, Current Status Unknown    Packs/day: 1.00  . Smokeless tobacco: Never Used  Substance Use Topics  . Alcohol use: Never    Frequency: Never    FAMILY HISTORY: Mother and father deceased  DRUG ALLERGIES: No Known Allergies  REVIEW OF SYSTEMS:   CONSTITUTIONAL: No fever, fatigue or weakness.  EYES: No blurred or double vision.  EARS, NOSE, AND THROAT: No tinnitus or ear pain.  RESPIRATORY: No cough, shortness of breath, wheezing or hemoptysis.  CARDIOVASCULAR: Has chest pain, no orthopnea, edema.  GASTROINTESTINAL: No nausea, vomiting, diarrhea or abdominal pain.  GENITOURINARY: No dysuria, hematuria.  ENDOCRINE: No polyuria, nocturia,  HEMATOLOGY: No anemia, easy bruising or bleeding SKIN: No rash or lesion. MUSCULOSKELETAL: No joint pain or arthritis.   NEUROLOGIC: No tingling, numbness, weakness.  PSYCHIATRY: No  anxiety or depression.   MEDICATIONS AT HOME:  Prior to Admission medications   Medication Sig Start Date End Date Taking? Authorizing Provider  acetaminophen (TYLENOL) 500 MG tablet Take 500 mg by mouth 3 (three) times daily.   Yes [provider]  aspirin EC 81 MG tablet Take 81 mg by mouth daily.   Yes [provider]  atorvastatin (LIPITOR) 80 MG tablet Take 80 mg by mouth at bedtime.   Yes [provider]  DULoxetine (CYMBALTA) 30 MG capsule Take 30 mg by mouth 2 (two) times daily.   Yes [provider]  ferrous sulfate 325 (65 FE) MG tablet Take 325 mg by mouth daily.   Yes [provider]  Fluticasone-Salmeterol (ADVAIR) 250-50 MCG/DOSE AEPB Inhale 1 puff into the lungs 2 (two) times daily.   Yes [provider]  furosemide (LASIX) 80 MG tablet Take 80 mg by mouth 2 (two) times daily.   Yes [provider]  lamoTRIgine (LAMICTAL) 25 MG tablet Take 25 mg by mouth 2 (two) times daily.   Yes [provider]  lidocaine (LIDODERM) 5 % Cut 1 patch in half and apply to amputated stump once daily 12 hours on, 12 hours off   Yes [provider]  liver oil-zinc oxide (DESITIN) 40 % ointment Apply 1 application topically at bedtime. To buttocks   Yes [provider]  loratadine (CLARITIN) 10 MG tablet Take 10 mg by mouth daily.   Yes [provider]  Melatonin 3 MG TABS Take 1 tablet by mouth at bedtime.  Yes [provider]  metFORMIN (GLUCOPHAGE) 500 MG tablet Take 500 mg by mouth daily.   Yes [provider]  nicotine (NICODERM CQ - DOSED IN MG/24 HOURS) 14 mg/24hr patch Place 14 mg onto the skin daily.   Yes [provider]  senna (SENOKOT) 8.6 MG tablet Take 2 tablets by mouth at bedtime.   Yes [provider]      PHYSICAL EXAMINATION:   VITAL SIGNS: Blood pressure 105/63, pulse (!) 108, temperature 98.1 F (36.7 C), temperature source Oral, resp. rate  14, height 6' (1.829 m), weight 75.3 kg, SpO2 96 %.  GENERAL:  73 y.o.-year-old patient lying in the bed with no acute distress.  EYES: Pupils equal, round, reactive to light and accommodation. No scleral icterus. Extraocular muscles intact.  HEENT: Head atraumatic, normocephalic. Oropharynx and nasopharynx clear.  NECK:  Supple, no jugular venous distention. No thyroid enlargement, no tenderness.  LUNGS: Normal breath sounds bilaterally, no wheezing, rales,rhonchi or crepitation. No use of accessory muscles of respiration.  CARDIOVASCULAR: S1, S2 normal. No murmurs, rubs, or gallops.  ABDOMEN: Soft, nontender, nondistended. Bowel sounds present. No organomegaly or mass.  EXTREMITIES: Left AKA noted NEUROLOGIC: Cranial nerves II through XII are intact. Muscle strength 5/5 in all extremities. Sensation intact. Gait not checked.  PSYCHIATRIC: The patient is alert and oriented x 3.  SKIN: No obvious rash, lesion, or ulcer.   LABORATORY PANEL:   CBC Recent Labs  Lab 01/26/19 1202  WBC 10.0  HGB 13.0  HCT 39.9  PLT 221  MCV 87.7  MCH 28.6  MCHC 32.6  RDW 14.7   ------------------------------------------------------------------------------------------------------------------  Chemistries  Recent Labs  Lab 01/26/19 1202  NA 139  K 3.6  CL 105  CO2 24  GLUCOSE 199*  BUN 20  CREATININE 1.29*  CALCIUM 8.5*   ------------------------------------------------------------------------------------------------------------------ estimated creatinine clearance is 53.5 mL/min (A) (by C-G formula based on SCr of 1.29 mg/dL (H)). ------------------------------------------------------------------------------------------------------------------ No results for input(s): TSH, T4TOTAL, T3FREE, THYROIDAB in the last 72 hours.  Invalid input(s): FREET3   Coagulation profile No results for input(s): INR, PROTIME in the last 168  hours. ------------------------------------------------------------------------------------------------------------------- No results for input(s): DDIMER in the last 72 hours. -------------------------------------------------------------------------------------------------------------------  Cardiac Enzymes Recent Labs  Lab 01/26/19 1202  TROPONINI <0.03   ------------------------------------------------------------------------------------------------------------------ Invalid input(s): POCBNP  ---------------------------------------------------------------------------------------------------------------  Urinalysis No results found for: COLORURINE, APPEARANCEUR, LABSPEC, PHURINE, GLUCOSEU, HGBUR, BILIRUBINUR, KETONESUR, PROTEINUR, UROBILINOGEN, NITRITE, LEUKOCYTESUR   RADIOLOGY: Dg Chest Port 1 View  Result Date: 01/26/2019 CLINICAL DATA:  Chest pain since this morning which has resolved. Diarrhea yesterday. EXAM: PORTABLE CHEST 1 VIEW COMPARISON:  01/18/2004 FINDINGS: Lungs are adequately inflated without lobar consolidation or effusion. Mild prominence of the central bronchovascular markings which may be due to an acute bronchitic process. Cardiomediastinal silhouette and remainder of the exam is unchanged. IMPRESSION: Minimal prominence of the central bronchovascular markings which may be due to an acute bronchitic process. Electronically Signed   By: Marin Olp M.D.   On: 01/26/2019 12:21    EKG: Orders placed or performed during the hospital encounter of 01/26/19  . ED EKG  . ED EKG  . EKG 12-Lead  . EKG 12-Lead    IMPRESSION AND PLAN: 73 year old male patient with a known history of GERD, diabetes mellitus type 2, depression presented to the emergency room for chest pain.  Patient has history of MI and CABG. pain is located retrosternally 5 out of 10 on a scale of 1-10 sharp in nature.  -Chest pain  Admit patient to telemetry Start patient on aspirin PRN morphine and  nitrates for chest pain Cycle troponin check echocardiogram Cardiology consult with [facility] health cardiology DVT prophylaxis subcu Lovenox daily Cardiac stress test in a.m.  -Type 2 diabetes mellitus Diabetic diet with sliding scale coverage with insulin  -GERD Oral PPI  -DVT prophylaxis subcu Lovenox daily  All the records are reviewed and case discussed with ED provider. Management plans discussed with the patient, family and they are in agreement.  CODE STATUS:Full code  TOTAL TIME TAKING CARE OF THIS PATIENT: 53 minutes.    Saundra Shelling M.D on 01/26/2019 at 2:19 PM  Between 7am to 6pm - Pager - [phone]  After 6pm go to [email or web address] - password [email or web address] [facility]  [facility]  Office  [phone]  CC: Primary care physician; [facility]

## 2019-01-26 NOTE — ED Notes (Signed)
Attempt to call report, receiving RN unavailable

## 2019-01-27 ENCOUNTER — Encounter: Payer: Self-pay | Admitting: *Deleted

## 2019-01-27 ENCOUNTER — Observation Stay (HOSPITAL_COMMUNITY): Payer: Medicare Other

## 2019-01-27 DIAGNOSIS — Z8249 Family history of ischemic heart disease and other diseases of the circulatory system: Secondary | ICD-10-CM | POA: Diagnosis not present

## 2019-01-27 DIAGNOSIS — E876 Hypokalemia: Secondary | ICD-10-CM | POA: Diagnosis present

## 2019-01-27 DIAGNOSIS — Z7982 Long term (current) use of aspirin: Secondary | ICD-10-CM | POA: Diagnosis not present

## 2019-01-27 DIAGNOSIS — I129 Hypertensive chronic kidney disease with stage 1 through stage 4 chronic kidney disease, or unspecified chronic kidney disease: Secondary | ICD-10-CM | POA: Diagnosis present

## 2019-01-27 DIAGNOSIS — I25118 Atherosclerotic heart disease of native coronary artery with other forms of angina pectoris: Secondary | ICD-10-CM | POA: Diagnosis present

## 2019-01-27 DIAGNOSIS — I252 Old myocardial infarction: Secondary | ICD-10-CM | POA: Diagnosis not present

## 2019-01-27 DIAGNOSIS — D649 Anemia, unspecified: Secondary | ICD-10-CM | POA: Diagnosis present

## 2019-01-27 DIAGNOSIS — Z89612 Acquired absence of left leg above knee: Secondary | ICD-10-CM | POA: Diagnosis not present

## 2019-01-27 DIAGNOSIS — Z7984 Long term (current) use of oral hypoglycemic drugs: Secondary | ICD-10-CM | POA: Diagnosis not present

## 2019-01-27 DIAGNOSIS — F17210 Nicotine dependence, cigarettes, uncomplicated: Secondary | ICD-10-CM | POA: Diagnosis present

## 2019-01-27 DIAGNOSIS — E1151 Type 2 diabetes mellitus with diabetic peripheral angiopathy without gangrene: Secondary | ICD-10-CM | POA: Diagnosis present

## 2019-01-27 DIAGNOSIS — R079 Chest pain, unspecified: Secondary | ICD-10-CM

## 2019-01-27 DIAGNOSIS — Z7951 Long term (current) use of inhaled steroids: Secondary | ICD-10-CM | POA: Diagnosis not present

## 2019-01-27 DIAGNOSIS — E785 Hyperlipidemia, unspecified: Secondary | ICD-10-CM | POA: Diagnosis present

## 2019-01-27 DIAGNOSIS — K219 Gastro-esophageal reflux disease without esophagitis: Secondary | ICD-10-CM | POA: Diagnosis present

## 2019-01-27 DIAGNOSIS — E1122 Type 2 diabetes mellitus with diabetic chronic kidney disease: Secondary | ICD-10-CM | POA: Diagnosis present

## 2019-01-27 DIAGNOSIS — J432 Centrilobular emphysema: Secondary | ICD-10-CM | POA: Diagnosis not present

## 2019-01-27 DIAGNOSIS — N289 Disorder of kidney and ureter, unspecified: Secondary | ICD-10-CM | POA: Diagnosis not present

## 2019-01-27 DIAGNOSIS — Z951 Presence of aortocoronary bypass graft: Secondary | ICD-10-CM | POA: Diagnosis not present

## 2019-01-27 DIAGNOSIS — F329 Major depressive disorder, single episode, unspecified: Secondary | ICD-10-CM | POA: Diagnosis present

## 2019-01-27 DIAGNOSIS — R001 Bradycardia, unspecified: Secondary | ICD-10-CM | POA: Diagnosis present

## 2019-01-27 DIAGNOSIS — N183 Chronic kidney disease, stage 3 (moderate): Secondary | ICD-10-CM | POA: Diagnosis present

## 2019-01-27 DIAGNOSIS — N1831 Chronic kidney disease, stage 3a: Secondary | ICD-10-CM

## 2019-01-27 DIAGNOSIS — I255 Ischemic cardiomyopathy: Secondary | ICD-10-CM | POA: Diagnosis not present

## 2019-01-27 DIAGNOSIS — I480 Paroxysmal atrial fibrillation: Secondary | ICD-10-CM | POA: Diagnosis present

## 2019-01-27 DIAGNOSIS — Z79899 Other long term (current) drug therapy: Secondary | ICD-10-CM | POA: Diagnosis not present

## 2019-01-27 DIAGNOSIS — J449 Chronic obstructive pulmonary disease, unspecified: Secondary | ICD-10-CM | POA: Diagnosis present

## 2019-01-27 DIAGNOSIS — F419 Anxiety disorder, unspecified: Secondary | ICD-10-CM | POA: Diagnosis present

## 2019-01-27 DIAGNOSIS — Z7901 Long term (current) use of anticoagulants: Secondary | ICD-10-CM | POA: Diagnosis not present

## 2019-01-27 LAB — GLUCOSE, CAPILLARY
Glucose-Capillary: 114 mg/dL — ABNORMAL HIGH (ref 70–99)
Glucose-Capillary: 136 mg/dL — ABNORMAL HIGH (ref 70–99)
Glucose-Capillary: 148 mg/dL — ABNORMAL HIGH (ref 70–99)
Glucose-Capillary: 157 mg/dL — ABNORMAL HIGH (ref 70–99)

## 2019-01-27 LAB — BASIC METABOLIC PANEL
Anion gap: 10 (ref 5–15)
BUN: 26 mg/dL — ABNORMAL HIGH (ref 8–23)
CO2: 26 mmol/L (ref 22–32)
Calcium: 8.7 mg/dL — ABNORMAL LOW (ref 8.9–10.3)
Chloride: 106 mmol/L (ref 98–111)
Creatinine, Ser: 1.55 mg/dL — ABNORMAL HIGH (ref 0.61–1.24)
GFR calc Af Amer: 50 mL/min — ABNORMAL LOW (ref >60)
GFR calc non Af Amer: 43 mL/min — ABNORMAL LOW (ref >60)
Glucose, Bld: 137 mg/dL — ABNORMAL HIGH (ref 70–99)
Potassium: 3.7 mmol/L (ref 3.5–5.1)
Sodium: 142 mmol/L (ref 135–145)

## 2019-01-27 LAB — NM MYOCAR MULTI W/SPECT W/WALL MOTION / EF
Estimated workload: 1 METS
Exercise duration (min): 0 min
Exercise duration (sec): 0 s
LV dias vol: 171 mL (ref 62–150)
LV sys vol: 108 mL
MPHR: 146 {beats}/min
Peak HR: 90 {beats}/min
Percent HR: 61 %
Rest HR: 56 {beats}/min
TID: 1.01

## 2019-01-27 LAB — CBC
HCT: 37.7 % — ABNORMAL LOW (ref 39.0–52.0)
Hemoglobin: 11.9 g/dL — ABNORMAL LOW (ref 13.0–17.0)
MCH: 28.1 pg (ref 26.0–34.0)
MCHC: 31.6 g/dL (ref 30.0–36.0)
MCV: 89.1 fL (ref 80.0–100.0)
Platelets: 200 10*3/uL (ref 150–400)
RBC: 4.23 MIL/uL (ref 4.22–5.81)
RDW: 14.7 % (ref 11.5–15.5)
WBC: 11.5 10*3/uL — ABNORMAL HIGH (ref 4.0–10.5)
nRBC: 0 % (ref 0.0–0.2)

## 2019-01-27 LAB — MAGNESIUM: Magnesium: 2.2 mg/dL (ref 1.7–2.4)

## 2019-01-27 LAB — LIPID PANEL
Cholesterol: 143 mg/dL (ref 0–200)
HDL: 51 mg/dL (ref >40)
LDL Cholesterol: 62 mg/dL (ref 0–99)
Total CHOL/HDL Ratio: 2.8 RATIO
Triglycerides: 152 mg/dL — ABNORMAL HIGH (ref <150)
VLDL: 30 mg/dL (ref 0–40)

## 2019-01-27 LAB — MRSA PCR SCREENING: MRSA by PCR: POSITIVE — AB

## 2019-01-27 LAB — TROPONIN I: Troponin I: <0.03 ng/mL (ref <0.03)

## 2019-01-27 MED ORDER — ISOSORBIDE MONONITRATE ER 30 MG PO TB24
15.0000 mg | ORAL_TABLET | Freq: Every day | ORAL | Status: DC
  Administered 2019-01-27 – 2019-01-28 (×2): 15 mg via ORAL
  Filled 2019-01-27 (×2): qty 1

## 2019-01-27 MED ORDER — TECHNETIUM TC 99M TETROFOSMIN IV KIT
30.0050 | PACK | Freq: Once | INTRAVENOUS | Status: AC | PRN
  Administered 2019-01-27: 30.005 via INTRAVENOUS

## 2019-01-27 MED ORDER — OXYCODONE HCL 5 MG PO TABS
10.0000 mg | ORAL_TABLET | ORAL | Status: DC | PRN
  Administered 2019-01-27: 10 mg via ORAL
  Filled 2019-01-27: qty 2

## 2019-01-27 MED ORDER — ATORVASTATIN CALCIUM 80 MG PO TABS
80.0000 mg | ORAL_TABLET | Freq: Every day | ORAL | Status: DC
  Administered 2019-01-27: 80 mg via ORAL
  Filled 2019-01-27: qty 4
  Filled 2019-01-27 (×2): qty 1

## 2019-01-27 MED ORDER — MELATONIN 5 MG PO TABS
0.5000 | ORAL_TABLET | Freq: Every day | ORAL | Status: DC
  Administered 2019-01-27: 2.5 mg via ORAL
  Filled 2019-01-27 (×2): qty 0.5

## 2019-01-27 MED ORDER — PANTOPRAZOLE SODIUM 40 MG PO TBEC
40.0000 mg | DELAYED_RELEASE_TABLET | Freq: Every day | ORAL | Status: DC
  Administered 2019-01-27 – 2019-01-28 (×2): 40 mg via ORAL
  Filled 2019-01-27 (×2): qty 1

## 2019-01-27 MED ORDER — TECHNETIUM TC 99M TETROFOSMIN IV KIT
10.9800 | PACK | Freq: Once | INTRAVENOUS | Status: AC | PRN
  Administered 2019-01-27: 10.98 via INTRAVENOUS

## 2019-01-27 MED ORDER — CHLORHEXIDINE GLUCONATE CLOTH 2 % EX PADS
6.0000 | MEDICATED_PAD | Freq: Every day | CUTANEOUS | Status: DC
  Administered 2019-01-27 – 2019-01-28 (×2): 6 via TOPICAL

## 2019-01-27 MED ORDER — ACETAMINOPHEN 500 MG PO TABS
500.0000 mg | ORAL_TABLET | Freq: Three times a day (TID) | ORAL | Status: DC
  Administered 2019-01-27 – 2019-01-28 (×3): 500 mg via ORAL
  Filled 2019-01-27 (×3): qty 1

## 2019-01-27 MED ORDER — LORAZEPAM 0.5 MG PO TABS: 0.5000 mg | ORAL_TABLET | Freq: Two times a day (BID) | ORAL | Status: DC | PRN

## 2019-01-27 MED ORDER — FERROUS SULFATE 325 (65 FE) MG PO TABS
325.0000 mg | ORAL_TABLET | Freq: Every day | ORAL | Status: DC
  Administered 2019-01-27 – 2019-01-28 (×2): 325 mg via ORAL
  Filled 2019-01-27 (×2): qty 1

## 2019-01-27 MED ORDER — ASPIRIN EC 81 MG PO TBEC
81.0000 mg | DELAYED_RELEASE_TABLET | Freq: Every day | ORAL | Status: DC
  Administered 2019-01-27 – 2019-01-28 (×2): 81 mg via ORAL
  Filled 2019-01-27 (×2): qty 1

## 2019-01-27 MED ORDER — DULOXETINE HCL 30 MG PO CPEP
30.0000 mg | ORAL_CAPSULE | Freq: Two times a day (BID) | ORAL | Status: DC
  Administered 2019-01-27 – 2019-01-28 (×2): 30 mg via ORAL
  Filled 2019-01-27 (×2): qty 1

## 2019-01-27 MED ORDER — ALBUTEROL SULFATE HFA 108 (90 BASE) MCG/ACT IN AERS: 2.0000 | INHALATION_SPRAY | Freq: Four times a day (QID) | RESPIRATORY_TRACT | Status: DC | PRN

## 2019-01-27 MED ORDER — ALBUTEROL SULFATE (2.5 MG/3ML) 0.083% IN NEBU: 2.5000 mg | INHALATION_SOLUTION | RESPIRATORY_TRACT | Status: DC | PRN

## 2019-01-27 MED ORDER — POTASSIUM CHLORIDE CRYS ER 10 MEQ PO TBCR
10.0000 meq | EXTENDED_RELEASE_TABLET | Freq: Two times a day (BID) | ORAL | Status: AC
  Administered 2019-01-27 (×2): 10 meq via ORAL
  Filled 2019-01-27 (×2): qty 1

## 2019-01-27 MED ORDER — FLUTICASONE FUROATE-VILANTEROL 200-25 MCG/INH IN AEPB
1.0000 | INHALATION_SPRAY | Freq: Every day | RESPIRATORY_TRACT | Status: DC
  Administered 2019-01-27 – 2019-01-28 (×2): 1 via RESPIRATORY_TRACT
  Filled 2019-01-27: qty 28

## 2019-01-27 MED ORDER — REGADENOSON 0.4 MG/5ML IV SOLN
0.4000 mg | Freq: Once | INTRAVENOUS | Status: AC
  Administered 2019-01-27: 0.4 mg via INTRAVENOUS
  Filled 2019-01-27: qty 5

## 2019-01-27 MED ORDER — LORATADINE 10 MG PO TABS
10.0000 mg | ORAL_TABLET | Freq: Every day | ORAL | Status: DC
  Administered 2019-01-27 – 2019-01-28 (×2): 10 mg via ORAL
  Filled 2019-01-27 (×2): qty 1

## 2019-01-27 MED ORDER — MUPIROCIN 2 % EX OINT
1.0000 "application " | TOPICAL_OINTMENT | Freq: Two times a day (BID) | CUTANEOUS | Status: DC
  Administered 2019-01-27 – 2019-01-28 (×3): 1 via NASAL
  Filled 2019-01-27: qty 22

## 2019-01-27 MED ORDER — LAMOTRIGINE 25 MG PO TABS
25.0000 mg | ORAL_TABLET | Freq: Two times a day (BID) | ORAL | Status: DC
  Administered 2019-01-27 – 2019-01-28 (×2): 25 mg via ORAL
  Filled 2019-01-27 (×3): qty 1

## 2019-01-27 MED ORDER — SODIUM CHLORIDE 0.9 % IV SOLN
INTRAVENOUS | Status: AC
  Administered 2019-01-27: 12:00:00 via INTRAVENOUS

## 2019-01-27 MED ORDER — TAMSULOSIN HCL 0.4 MG PO CAPS
0.4000 mg | ORAL_CAPSULE | Freq: Every day | ORAL | Status: DC
  Administered 2019-01-27 – 2019-01-28 (×2): 0.4 mg via ORAL
  Filled 2019-01-27 (×2): qty 1

## 2019-01-27 NOTE — Plan of Care (Signed)
  Problem: Clinical Measurements: Goal: Respiratory complications will improve Outcome: Progressing Note:  On room air   Problem: Nutrition: Goal: Adequate nutrition will be maintained Outcome: Progressing   Problem: Coping: Goal: Level of anxiety will decrease Outcome: Progressing   Problem: Elimination: Goal: Will not experience complications related to urinary retention Outcome: Progressing   Problem: Pain Managment: Goal: General experience of comfort will improve Outcome: Progressing   Problem: Safety: Goal: Ability to remain free from injury will improve Outcome: Progressing   Problem: Skin Integrity: Goal: Risk for impaired skin integrity will decrease Outcome: Progressing

## 2019-01-27 NOTE — Progress Notes (Signed)
Progress Note  Patient Name: Shawn Jennings Date of Encounter: 01/27/2019  Primary Cardiologist: [name], MD   Subjective   Patient reporting constant, 5/10, central, non-pleuritic, and non-radiating chest pain this morning, improved from yesterday's 7/10 chest pain.  Some SOB. He also reports frequent palpitations and intermittent feelings of racing heart rate.  He denies any nausea but does report that he feels hungry this morning.  He did not sleep well last night.    Inpatient Medications    Scheduled Meds: . aspirin EC  81 mg Oral Daily  . atorvastatin  80 mg Oral q1800  . Chlorhexidine Gluconate Cloth  6 each Topical Q0600  . enoxaparin (LOVENOX) injection  40 mg Subcutaneous Q24H  . insulin aspart  0-15 Units Subcutaneous TID WC  . insulin aspart  0-5 Units Subcutaneous QHS  . mupirocin ointment  1 application Nasal BID  . sodium chloride flush  3 mL Intravenous Q12H   Continuous Infusions: . sodium chloride     PRN Meds: sodium chloride, acetaminophen, nitroGLYCERIN, ondansetron (ZOFRAN) IV, sodium chloride flush   Vital Signs    Vitals:   01/26/19 1515 01/26/19 1931 01/27/19 0343 01/27/19 0743  BP: 111/70 123/75 135/77 123/61  Pulse: 65 66 (!) 55 (!) 52  Resp: 18 20 16    Temp: 97.9 F (36.6 C) 98.1 F (36.7 C) 98.2 F (36.8 C) (!) 97.5 F (36.4 C)  TempSrc: Oral Oral Oral Oral  SpO2: 99% 94% 97% 97%  Weight:   60.9 kg   Height:        Intake/Output Summary (Last 24 hours) at 01/27/2019 0905 Last data filed at 01/27/2019 0015 Gross per 24 hour  Intake 3 ml  Output 350 ml  Net -347 ml   Filed Weights   01/26/19 1205 01/27/19 0343  Weight: 75.3 kg 60.9 kg    Telemetry    SR - Personally Reviewed  ECG    No new tracings- Personally Reviewed  Physical Exam   GEN: No acute distress.  Lying in bed and watching television.  Neck: No JVD Cardiac: RRR, 2/6 systolic murmur, no rubs or gallops.  Respiratory: Diminished breath sounds  throughout, faint wheezing and rhonchi throughout GI: Soft, nontender, non-distended  MS: S/p L AKA. Right lower extremity without swelling Neuro:  Nonfocal, some intermittent confusion noted Psych: Normal affect   Labs    Chemistry Recent Labs  Lab 01/26/19 1202 01/27/19 0237  NA 139 142  K 3.6 3.7  CL 105 106  CO2 24 26  GLUCOSE 199* 137*  BUN 20 26*  CREATININE 1.29* 1.55*  CALCIUM 8.5* 8.7*  GFRNONAA 54* 43*  GFRAA >60 50*  ANIONGAP 10 10     Hematology Recent Labs  Lab 01/26/19 1202 01/27/19 0237  WBC 10.0 11.5*  RBC 4.55 4.23  HGB 13.0 11.9*  HCT 39.9 37.7*  MCV 87.7 89.1  MCH 28.6 28.1  MCHC 32.6 31.6  RDW 14.7 14.7  PLT 221 200    Cardiac Enzymes Recent Labs  Lab 01/26/19 1202 01/26/19 1545 01/26/19 2040 01/27/19 0237  TROPONINI <0.03 <0.03 <0.03 <0.03   No results for input(s): TROPIPOC in the last 168 hours.   BNPNo results for input(s): BNP, PROBNP in the last 168 hours.   DDimer No results for input(s): DDIMER in the last 168 hours.   Radiology    Dg Chest Port 1 View  Result Date: 01/26/2019 CLINICAL DATA:  Chest pain since this morning which has resolved. Diarrhea yesterday. EXAM:  PORTABLE CHEST 1 VIEW COMPARISON:  01/18/2004 FINDINGS: Lungs are adequately inflated without lobar consolidation or effusion. Mild prominence of the central bronchovascular markings which may be due to an acute bronchitic process. Cardiomediastinal silhouette and remainder of the exam is unchanged. IMPRESSION: Minimal prominence of the central bronchovascular markings which may be due to an acute bronchitic process. Electronically Signed   By: Marin Olp M.D.   On: 01/26/2019 12:21    Cardiac Studies   TTE 01/26/2019 1. The left ventricle has mild-moderately reduced systolic function, with an ejection fraction of 40-45%. The cavity size was mildly dilated. Left ventricular diastolic Doppler parameters are consistent with impaired relaxation. The study is not   adequate for wall motion abnormalities.  2. The right ventricle has normal systolic function. The cavity was normal. There is no increase in right ventricular wall thickness.  3. Left atrial size was mildly dilated.  4. The mitral valve is degenerative. Moderate calcification of the mitral valve leaflet. There is severe mitral annular calcification present.  5. The tricuspid valve is grossly normal.  6. The aortic valve is tricuspid. Moderate calcification of the aortic valve. Aortic valve regurgitation was not assessed by color flow Doppler mild-moderate stenosis of the aortic valve. Mean gradient of 13 mm Hg.  7. No pulmonic valve vegetation visualized.  Patient Profile     73 y.o. 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  Assessment & Plan    Atypical Chest pain with known h/o CAD - Chest pain reported as improved from yesterday and now 5/10. Intermittent SOB. - S/p CABG in [place], [place] in 1994.  He has multiple risk factors for cardiac etiology / comorbid conditions including h/o smoking, HTN, DM2, and HLD with PAD. Per review of EMR, it has been difficult to gather a consistent HPI.  Today, he does not recall a stress test performed in the past. He stated that this chest pain is new for him.  He has not had chest pain like this before in the past.  No aggravating or alleviating factors. -Troponin negative.  EKG without acute changes - Given his history of CAD and no recent stress test or cardiac evaluation on file, will plan for Lexiscan today. Continue medical management with ASA 81mg  daily. 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 Not on a BB with current bradycardic rates. Continue nitrates for chest pain as blood pressure allows.   HTN  - Currently  controlled. PTA lasix of 80mg  BID, currently on hold.   HLD - Continue statin therapy with atorvastatin 80mg  daily  Anemia  - Mild. Continue to monitor with daily CBC given PTA Xarelto. Consider anemia of chronic disease with CKD as below. Transfuse for Hgb less than 7.5.   CKD  -Cr  Increased 1.29  1.55, BUN 20  26  - PTA lasix currently on hold. Continue to hold as not volume overloaded on exam and increase in Cr. PTA Xarelto, currently on hold, with reduced renal dosage.  - Daily BMET to monitor renal function and electrolytes   Hypokalemia - K 3.7. Replete with goal 4.0. Check Mg. Repeat AM BMET.  PAD s/p L AKA - Continue statin    Patient reported PAF, currently SR - Reports h/o PAF (unable to obtain confirmation  via EMR) with current palpitations and feelings of racing HR and SOB.  - Currently SR with bradycardic rates  - PTA Xarelto 15mg  daily, currently on hold. Continue to hold pending stress test. Of note, patient is on reduced dosage as CrCl under 50.  - Continue lovenox, ASA  DM2 - SSI. PTA metformin on hold.  - Per IM  COPD - Inconsistent historian and unclear if still smoking. Lives at ALF but reported 1 ppd usage. PTA medications include nicoderm CQ.  - Smoking cessation advised.  - Continue inhalers as needed.     For questions or updates, please contact [place] Please consult [email or web address] for contact info under        Signed, Arvil Chaco, PA-C  01/27/2019, 9:05 AM

## 2019-01-27 NOTE — Progress Notes (Signed)
[place] at [facility]                                                                                                                                                                                  Patient Demographics   Shawn Jennings, is a 73 y.o. male, DOB - Jun 22, 1946, NID:[id number]  Admit date - 01/26/2019   Admitting Physician Saundra Shelling, MD  Outpatient Primary MD for the patient is [facility]   LOS - 0  Subjective: Patient complains of chest pain had stress test order    Review of Systems:   CONSTITUTIONAL: No documented fever. No fatigue, weakness. No weight gain, no weight loss.  EYES: No blurry or double vision.  ENT: No tinnitus. No postnasal drip. No redness of the oropharynx.  RESPIRATORY: No cough, no wheeze, no hemoptysis. No dyspnea.  CARDIOVASCULAR: +chest pain. No orthopnea. No palpitations. No syncope.  GASTROINTESTINAL: No nausea, no vomiting or diarrhea. No abdominal pain. No melena or hematochezia.  GENITOURINARY: No dysuria or hematuria.  ENDOCRINE: No polyuria or nocturia. No heat or cold intolerance.  HEMATOLOGY: No anemia. No bruising. No bleeding.  INTEGUMENTARY: No rashes. No lesions.  MUSCULOSKELETAL: No arthritis. No swelling. No gout.  NEUROLOGIC: No numbness, tingling, or ataxia. No seizure-type activity.  PSYCHIATRIC: No anxiety. No insomnia. No ADD.    Vitals:   Vitals:   01/26/19 1515 01/26/19 1931 01/27/19 0343 01/27/19 0743  BP: 111/70 123/75 135/77 123/61  Pulse: 65 66 (!) 55 (!) 52  Resp: 18 20 16    Temp: 97.9 F (36.6 C) 98.1 F (36.7 C) 98.2 F (36.8 C) (!) 97.5 F (36.4 C)  TempSrc: Oral Oral Oral Oral  SpO2: 99% 94% 97% 97%  Weight:   60.9 kg   Height:        Wt Readings from Last 3 Encounters:  01/27/19 60.9 kg     Intake/Output Summary (Last 24 hours) at 01/27/2019 1412 Last data filed at 01/27/2019 0015 Gross per 24 hour  Intake 3 ml  Output 350 ml  Net -347 ml     Physical Exam:   GENERAL: Pleasant-appearing in no apparent distress.  HEAD, EYES, EARS, NOSE AND THROAT: Atraumatic, normocephalic. Extraocular muscles are intact. Pupils equal and reactive to light. Sclerae anicteric. No conjunctival injection. No oro-pharyngeal erythema.  NECK: Supple. There is no jugular venous distention. No bruits, no lymphadenopathy, no thyromegaly.  HEART: Regular rate and rhythm,. No murmurs, no rubs, no clicks.  LUNGS: Clear to auscultation bilaterally. No rales or rhonchi. No wheezes.  ABDOMEN: Soft, flat, nontender, nondistended. Has good bowel sounds. No hepatosplenomegaly appreciated.  EXTREMITIES: No evidence of any cyanosis, clubbing,  or peripheral edema.  +2 pedal and radial pulses bilaterally.  NEUROLOGIC: The patient is alert, awake, and oriented x3 with no focal motor or sensory deficits appreciated bilaterally.  SKIN: Moist and warm with no rashes appreciated.  Psych: Not anxious, depressed LN: No inguinal LN enlargement    Antibiotics   Anti-infectives (From admission, onward)   None      Medications   Scheduled Meds: . aspirin EC  81 mg Oral Daily  . atorvastatin  80 mg Oral q1800  . Chlorhexidine Gluconate Cloth  6 each Topical Q0600  . enoxaparin (LOVENOX) injection  40 mg Subcutaneous Q24H  . insulin aspart  0-15 Units Subcutaneous TID WC  . insulin aspart  0-5 Units Subcutaneous QHS  . isosorbide mononitrate  15 mg Oral Daily  . mupirocin ointment  1 application Nasal BID  . potassium chloride  10 mEq Oral BID  . sodium chloride flush  3 mL Intravenous Q12H   Continuous Infusions: . sodium chloride    . sodium chloride 50 mL/hr at 01/27/19 1226   PRN Meds:.sodium chloride, acetaminophen, nitroGLYCERIN, ondansetron (ZOFRAN) IV, sodium chloride flush   Data Review:   Micro Results Recent Results (from the past 240 hour(s))  MRSA PCR Screening     Status: Abnormal   Collection Time: 01/27/19 12:56 AM  Result Value Ref  Range Status   MRSA by PCR POSITIVE (A) NEGATIVE Final    Comment:        The GeneXpert MRSA Assay (FDA approved for NASAL specimens only), is one component of a comprehensive MRSA colonization surveillance program. It is not intended to diagnose MRSA infection nor to guide or monitor treatment for MRSA infections. RESULT CALLED TO, READ BACK BY AND VERIFIED WITHMalcolm Metro RN [phone] 01/27/2019 HNM Performed at [facility], [address], [place], [place] [address]     Radiology Reports Nm Myocar Multi W/spect W/wall Motion / Ef  Result Date: 01/27/2019  Abnormal, potentially high risk pharmacologic myocardial perfusion stress test.  There is a large in size, severe, fixed inferior and inferolateral defect extending to the apex consistent with scar.  No significant ischemia is identified, though sensititivity is reduced by the extent of prior infarct.  The left ventricular ejection fraction is moderately to severely reduced (LVEF 20% by QGS and 37% by Siemens calculation). LVEF noted to be 40-45% on yesterday's echocardiogram.  Attenuation correction CT is notable for post-CABG and AVR findings. The gallbladder is surgically absent.    Dg Chest Port 1 View  Result Date: 01/26/2019 CLINICAL DATA:  Chest pain since this morning which has resolved. Diarrhea yesterday. EXAM: PORTABLE CHEST 1 VIEW COMPARISON:  01/18/2004 FINDINGS: Lungs are adequately inflated without lobar consolidation or effusion. Mild prominence of the central bronchovascular markings which may be due to an acute bronchitic process. Cardiomediastinal silhouette and remainder of the exam is unchanged. IMPRESSION: Minimal prominence of the central bronchovascular markings which may be due to an acute bronchitic process. Electronically Signed   By: Marin Olp M.D.   On: 01/26/2019 12:21     CBC Recent Labs  Lab 01/26/19 1202 01/27/19 0237  WBC 10.0 11.5*  HGB 13.0 11.9*  HCT 39.9 37.7*  PLT 221 200   MCV 87.7 89.1  MCH 28.6 28.1  MCHC 32.6 31.6  RDW 14.7 14.7    Chemistries  Recent Labs  Lab 01/26/19 1202 01/27/19 0237  NA 139 142  K 3.6 3.7  CL 105 106  CO2 24 26  GLUCOSE 199* 137*  BUN 20 26*  CREATININE 1.29* 1.55*  CALCIUM 8.5* 8.7*  MG  --  2.2   ------------------------------------------------------------------------------------------------------------------ estimated creatinine clearance is 36 mL/min (A) (by C-G formula based on SCr of 1.55 mg/dL (H)). ------------------------------------------------------------------------------------------------------------------ No results for input(s): HGBA1C in the last 72 hours. ------------------------------------------------------------------------------------------------------------------ Recent Labs    01/27/19 0237  CHOL 143  HDL 51  LDLCALC 62  TRIG 152*  CHOLHDL 2.8   ------------------------------------------------------------------------------------------------------------------ No results for input(s): TSH, T4TOTAL, T3FREE, THYROIDAB in the last 72 hours.  Invalid input(s): FREET3 ------------------------------------------------------------------------------------------------------------------ No results for input(s): VITAMINB12, FOLATE, FERRITIN, TIBC, IRON, RETICCTPCT in the last 72 hours.  Coagulation profile No results for input(s): INR, PROTIME in the last 168 hours.  No results for input(s): DDIMER in the last 72 hours.  Cardiac Enzymes Recent Labs  Lab 01/26/19 1545 01/26/19 2040 01/27/19 0237  TROPONINI <0.03 <0.03 <0.03   ------------------------------------------------------------------------------------------------------------------ Invalid input(s): POCBNP    Assessment & Plan   73 year old male patient with a known history of GERD, diabetes mellitus type 2, depression presented to the emergency room for chest pain.  Patient has history of MI and CABG. pain is located  retrosternally 5 out of 10 on a scale of 1-10 sharp in nature.  -Chest pain Status post stress test Continues to have chest pain plan to keep patient in the hospital per cardiology Continue aspirin Imdur has been added  -Type 2 diabetes mellitus Diabetic diet with sliding scale coverage with insulin Hold metformin for now  -GERD Continue oral PPI  -Hyperlipidemia continue Lipitor  -Depression continue Cymbalta  -Anxiety continue lorazepam as needed  -DVT prophylaxis subcu Lovenox daily     Code Status Orders  (From admission, onward)         Start     Ordered   01/26/19 1440  Full code  Continuous     01/26/19 1439        Code Status History    This patient has a current code status but no historical code status.           Consults cardiology  DVT Prophylaxis  Lovenox   Lab Results  Component Value Date   PLT 200 01/27/2019     Time Spent in minutes   35 minutes greater than 50% of time spent in care coordination and counseling patient regarding the condition and plan of care.   Dustin Flock M.D on 01/27/2019 at 2:12 PM  Between 7am to 6pm - Pager - [phone]  After 6pm go to [email or web address] - Proofreader  [employer]   Office  [phone]

## 2019-01-27 NOTE — Progress Notes (Signed)
   Progress Note  Patient Name: Shawn Jennings Date of Encounter: 01/27/2019  Primary Cardiologist: [name], MD   Patient underwent pharmacologic myocardial perfusion stress testing this morning.  Study was potentially high risk with large area of inferolateral scar and moderately to severely reduced LVEF.  There also appears to be a sewing ring in the aortic valve position, suggestive of bioprosthetic aortic valve replacement based on radiographic and echocardiographic appearance.  Patient currently reports that he still has 5/10 chest pain, though he appears comfortable.  We discussed further evaluation and treatment options moving forward, including cardiac catheterization and medical therapy.  Patient would like to avoid catheterization if possible.  We have therefore agreed to add isosorbide mononitrate 15 mg daily, with up titration as blood pressure allows.  If he continues to have chest pain tomorrow, catheterization will need to be considered.  I will make [name] NPO after midnight should invasive procedures be necessary tomorrow.  For questions or updates, please contact [place] Please consult [email or web address] for contact info under [facility].     Signed, Nelva Bush, MD  01/27/2019, 2:23 PM

## 2019-01-28 DIAGNOSIS — J432 Centrilobular emphysema: Secondary | ICD-10-CM

## 2019-01-28 DIAGNOSIS — I255 Ischemic cardiomyopathy: Secondary | ICD-10-CM

## 2019-01-28 DIAGNOSIS — I25118 Atherosclerotic heart disease of native coronary artery with other forms of angina pectoris: Principal | ICD-10-CM

## 2019-01-28 DIAGNOSIS — N183 Chronic kidney disease, stage 3 (moderate): Secondary | ICD-10-CM

## 2019-01-28 DIAGNOSIS — I739 Peripheral vascular disease, unspecified: Secondary | ICD-10-CM

## 2019-01-28 LAB — CBC WITH DIFFERENTIAL/PLATELET
Abs Immature Granulocytes: 0.06 10*3/uL (ref 0.00–0.07)
Basophils Absolute: 0.1 10*3/uL (ref 0.0–0.1)
Basophils Relative: 1 %
Eosinophils Absolute: 0.5 10*3/uL (ref 0.0–0.5)
Eosinophils Relative: 5 %
HCT: 35.2 % — ABNORMAL LOW (ref 39.0–52.0)
Hemoglobin: 11.3 g/dL — ABNORMAL LOW (ref 13.0–17.0)
Immature Granulocytes: 1 %
Lymphocytes Relative: 25 %
Lymphs Abs: 2.5 10*3/uL (ref 0.7–4.0)
MCH: 27.9 pg (ref 26.0–34.0)
MCHC: 32.1 g/dL (ref 30.0–36.0)
MCV: 86.9 fL (ref 80.0–100.0)
Monocytes Absolute: 0.7 10*3/uL (ref 0.1–1.0)
Monocytes Relative: 7 %
Neutro Abs: 6 10*3/uL (ref 1.7–7.7)
Neutrophils Relative %: 61 %
Platelets: 206 10*3/uL (ref 150–400)
RBC: 4.05 MIL/uL — ABNORMAL LOW (ref 4.22–5.81)
RDW: 14.6 % (ref 11.5–15.5)
WBC: 9.9 10*3/uL (ref 4.0–10.5)
nRBC: 0 % (ref 0.0–0.2)

## 2019-01-28 LAB — BASIC METABOLIC PANEL
Anion gap: 10 (ref 5–15)
BUN: 22 mg/dL (ref 8–23)
CO2: 22 mmol/L (ref 22–32)
Calcium: 8.5 mg/dL — ABNORMAL LOW (ref 8.9–10.3)
Chloride: 111 mmol/L (ref 98–111)
Creatinine, Ser: 1.27 mg/dL — ABNORMAL HIGH (ref 0.61–1.24)
GFR calc Af Amer: >60 mL/min (ref >60)
GFR calc non Af Amer: 55 mL/min — ABNORMAL LOW (ref >60)
Glucose, Bld: 116 mg/dL — ABNORMAL HIGH (ref 70–99)
Potassium: 3.7 mmol/L (ref 3.5–5.1)
Sodium: 143 mmol/L (ref 135–145)

## 2019-01-28 LAB — GLUCOSE, CAPILLARY
Glucose-Capillary: 125 mg/dL — ABNORMAL HIGH (ref 70–99)
Glucose-Capillary: 121 mg/dL — ABNORMAL HIGH (ref 70–99)

## 2019-01-28 MED ORDER — OXYCODONE HCL 10 MG PO TABS: 10.0000 mg | ORAL_TABLET | Freq: Three times a day (TID) | ORAL | 0 refills | Status: DC | PRN

## 2019-01-28 MED ORDER — ISOSORBIDE MONONITRATE ER 30 MG PO TB24: 15.0000 mg | ORAL_TABLET | Freq: Every day | ORAL | 0 refills | Status: DC

## 2019-01-28 NOTE — NC FL2 (Addendum)
[place] LEVEL OF CARE SCREENING TOOL     IDENTIFICATION  Patient Name: Shawn Jennings Birthdate: [birth year] Sex: male Admission Date (Current Location): 01/26/2019  [place] and [place] Number:  Selena Lesser [id number] [place] and Address:  [facility], [address] [place], [place] [address]      Provider Number: [id number]  Attending Physician Name and Address:  Saundra Shelling, MD  Relative Name and Phone Number:  Lady Deutscher   [phone]     Current Level of Care: Hospital Recommended Level of Care: [place] Prior Approval Number:    Date Approved/Denied:   PASRR Number:    Discharge Plan: Other (Comment)([facility])    Current Diagnoses: Patient Active Problem List   Diagnosis Date Noted  . Chest pain 01/27/2019  . Renal insufficiency   . Nonspecific chest pain 01/26/2019    Orientation RESPIRATION BLADDER Height & Weight     Self, Situation, Place  Normal Incontinent Weight: 60.9 kg Height:  6' (182.9 cm)  BEHAVIORAL SYMPTOMS/MOOD NEUROLOGICAL BOWEL NUTRITION STATUS      Incontinent Diet  AMBULATORY STATUS COMMUNICATION OF NEEDS Skin   Independent Verbally Normal                       Personal Care Assistance Level of Assistance  Bathing Bathing Assistance: Limited assistance         Functional Limitations Info  Sight Sight Info: Adequate        SPECIAL CARE FACTORS FREQUENCY  PT (By licensed PT)     PT Frequency: 5 x week              Contractures Contractures Info: Not present    Additional Factors Info  Code Status, Allergies, Psychotropic Code Status Info: full Allergies Info: NKA           Current Medications (01/28/2019):  This is the current hospital active medication list Current Facility-Administered Medications  Medication Dose Route Frequency Provider Last Rate Last Dose  . 0.9 %  sodium chloride infusion  250 mL Intravenous PRN Pyreddy,  Reatha Harps, MD      . acetaminophen (TYLENOL) tablet 500 mg  500 mg Oral TID Dustin Flock, MD   500 mg at 01/28/19 0945  . acetaminophen (TYLENOL) tablet 650 mg  650 mg Oral Q4H PRN Pyreddy, Pavan, MD      . albuterol (PROVENTIL) (2.5 MG/3ML) 0.083% nebulizer solution 2.5 mg  2.5 mg Nebulization Q4H PRN Dustin Flock, MD      . aspirin EC tablet 81 mg  81 mg Oral Daily Dustin Flock, MD   81 mg at 01/28/19 0945  . atorvastatin (LIPITOR) tablet 80 mg  80 mg Oral QHS Dustin Flock, MD   80 mg at 01/27/19 2113  . Chlorhexidine Gluconate Cloth 2 % PADS 6 each  6 each Topical J2426 Saundra Shelling, MD   6 each at 01/28/19 0437  . DULoxetine (CYMBALTA) DR capsule 30 mg  30 mg Oral BID Dustin Flock, MD   30 mg at 01/28/19 0945  . enoxaparin (LOVENOX) injection 40 mg  40 mg Subcutaneous Q24H Saundra Shelling, MD   40 mg at 01/27/19 2115  . ferrous sulfate tablet 325 mg  325 mg Oral Daily Dustin Flock, MD   325 mg at 01/28/19 0945  . fluticasone furoate-vilanterol (BREO ELLIPTA) 200-25 MCG/INH 1 puff  1 puff Inhalation Daily Dustin Flock, MD   1 puff at 01/28/19 0945  . insulin aspart (  novoLOG) injection 0-15 Units  0-15 Units Subcutaneous TID WC Saundra Shelling, MD   2 Units at 01/28/19 0946  . insulin aspart (novoLOG) injection 0-5 Units  0-5 Units Subcutaneous QHS Pyreddy, Pavan, MD      . isosorbide mononitrate (IMDUR) 24 hr tablet 15 mg  15 mg Oral Daily End, Christopher, MD   15 mg at 01/28/19 0945  . lamoTRIgine (LAMICTAL) tablet 25 mg  25 mg Oral BID Dustin Flock, MD   25 mg at 01/28/19 0945  . loratadine (CLARITIN) tablet 10 mg  10 mg Oral Daily Dustin Flock, MD   10 mg at 01/28/19 0945  . LORazepam (ATIVAN) tablet 0.5 mg  0.5 mg Oral BID PRN Dustin Flock, MD      . Melatonin TABS 2.5 mg  0.5 tablet Oral QHS Dustin Flock, MD   2.5 mg at 01/27/19 2113  . mupirocin ointment (BACTROBAN) 2 % 1 application  1 application Nasal BID Saundra Shelling, MD   1 application at 91/47/82 0945   . nitroGLYCERIN (NITROSTAT) SL tablet 0.4 mg  0.4 mg Sublingual Q5 Min x 3 PRN Pyreddy, Reatha Harps, MD      . ondansetron (ZOFRAN) injection 4 mg  4 mg Intravenous Q6H PRN Pyreddy, Pavan, MD      . oxyCODONE (Oxy IR/ROXICODONE) immediate release tablet 10 mg  10 mg Oral Q4H PRN Dustin Flock, MD   10 mg at 01/27/19 1826  . pantoprazole (PROTONIX) EC tablet 40 mg  40 mg Oral Daily Dustin Flock, MD   40 mg at 01/28/19 0945  . sodium chloride flush (NS) 0.9 % injection 3 mL  3 mL Intravenous Q12H Saundra Shelling, MD   3 mL at 01/28/19 0946  . sodium chloride flush (NS) 0.9 % injection 3 mL  3 mL Intravenous PRN Pyreddy, Reatha Harps, MD      . tamsulosin (FLOMAX) capsule 0.4 mg  0.4 mg Oral Daily Dustin Flock, MD   0.4 mg at 01/28/19 0945     Discharge Medications:  Medication List    TAKE these medications   acetaminophen 500 MG tablet Commonly known as:  TYLENOL Take 500 mg by mouth 3 (three) times daily.   albuterol (2.5 MG/3ML) 0.083% nebulizer solution Commonly known as:  PROVENTIL Take 2.5 mg by nebulization every 4 (four) hours as needed for wheezing or shortness of breath.   Ventolin HFA 108 (90 Base) MCG/ACT inhaler Generic drug:  albuterol Inhale 2 puffs into the lungs every 6 (six) hours as needed for wheezing or shortness of breath.   aspirin EC 81 MG tablet Take 81 mg by mouth daily.   atorvastatin 80 MG tablet Commonly known as:  LIPITOR Take 80 mg by mouth at bedtime.   DULoxetine 30 MG capsule Commonly known as:  CYMBALTA Take 30 mg by mouth 2 (two) times daily.   ferrous sulfate 325 (65 FE) MG tablet Take 325 mg by mouth daily.   Fluticasone-Salmeterol 250-50 MCG/DOSE Aepb Commonly known as:  ADVAIR Inhale 1 puff into the lungs 2 (two) times daily.   furosemide 80 MG tablet Commonly known as:  LASIX Take 80 mg by mouth 2 (two) times daily.   ipratropium 17 MCG/ACT inhaler Commonly known as:  ATROVENT HFA Inhale 2 puffs into the lungs every 6  (six) hours as needed for wheezing.   isosorbide mononitrate 30 MG 24 hr tablet Commonly known as:  IMDUR Take 0.5 tablets (15 mg total) by mouth daily for 30 days. Start taking on:  January 29, 2019   lactulose 10 GM/15ML solution Commonly known as:  CHRONULAC Take 30 g by mouth every 12 (twelve) hours as needed for mild constipation.   lamoTRIgine 25 MG tablet Commonly known as:  LAMICTAL Take 25 mg by mouth 2 (two) times daily.   lidocaine 5 % Commonly known as:  LIDODERM Cut 1 patch in half and apply to amputated stump once daily 12 hours on, 12 hours off   liver oil-zinc oxide 40 % ointment Commonly known as:  DESITIN Apply 1 application topically at bedtime. To buttocks   loratadine 10 MG tablet Commonly known as:  CLARITIN Take 10 mg by mouth daily.   LORazepam 0.5 MG tablet Commonly known as:  ATIVAN Take 0.5 mg by mouth 2 (two) times daily as needed for anxiety.   Melatonin 3 MG Tabs Take 1 tablet by mouth at bedtime.   metFORMIN 500 MG tablet Commonly known as:  GLUCOPHAGE Take 500 mg by mouth daily.   nicotine 14 mg/24hr patch Commonly known as:  NICODERM CQ - dosed in mg/24 hours Place 14 mg onto the skin daily.   nitroGLYCERIN 0.4 MG SL tablet Commonly known as:  NITROSTAT Place 0.4 mg under the tongue every 5 (five) minutes as needed for chest pain.   Oxycodone HCl 10 MG Tabs Take 1 tablet (10 mg total) by mouth every 8 (eight) hours as needed (pain). What changed:  when to take this   pantoprazole 40 MG tablet Commonly known as:  PROTONIX Take 40 mg by mouth daily.   potassium chloride 10 MEQ tablet Commonly known as:  K-DUR,KLOR-CON Take 10 mEq by mouth daily.   PreserVision AREDS 2 Caps Take 1 capsule by mouth 2 (two) times daily.   Rivaroxaban 15 MG Tabs tablet Commonly known as:  XARELTO Take 15 mg by mouth daily.   senna 8.6 MG tablet Commonly known as:  SENOKOT Take 2 tablets by mouth at bedtime.   tamsulosin 0.4  MG Caps capsule Commonly known as:  FLOMAX Take 0.4 mg by mouth daily.   traZODone 50 MG tablet Commonly known as:  DESYREL Take 25 mg by mouth at bedtime as needed for sleep.   triamcinolone cream 0.1 % Commonly known as:  KENALOG Apply 1 application topically 2 (two) times daily.        Relevant Imaging Results:  Relevant Lab Results:   Additional Information ssn  [id number]  Elza Rafter, RN

## 2019-01-28 NOTE — Progress Notes (Signed)
Progress Note  Patient Name: Shawn Jennings Date of Encounter: 01/28/2019  Primary Cardiologist: [name], MD   Subjective   Denies any significant chest pain Symptoms resolved last night No further episodes all through the night and this morning, would like to eat Does not want any further cardiac testing and would like to go home Continues to smoke  Inpatient Medications    Scheduled Meds:  acetaminophen  500 mg Oral TID   aspirin EC  81 mg Oral Daily   atorvastatin  80 mg Oral QHS   Chlorhexidine Gluconate Cloth  6 each Topical Q0600   DULoxetine  30 mg Oral BID   enoxaparin (LOVENOX) injection  40 mg Subcutaneous Q24H   ferrous sulfate  325 mg Oral Daily   fluticasone furoate-vilanterol  1 puff Inhalation Daily   insulin aspart  0-15 Units Subcutaneous TID WC   insulin aspart  0-5 Units Subcutaneous QHS   isosorbide mononitrate  15 mg Oral Daily   lamoTRIgine  25 mg Oral BID   loratadine  10 mg Oral Daily   Melatonin  0.5 tablet Oral QHS   mupirocin ointment  1 application Nasal BID   pantoprazole  40 mg Oral Daily   sodium chloride flush  3 mL Intravenous Q12H   tamsulosin  0.4 mg Oral Daily   Continuous Infusions:  sodium chloride     PRN Meds: sodium chloride, acetaminophen, albuterol, LORazepam, nitroGLYCERIN, ondansetron (ZOFRAN) IV, oxyCODONE, sodium chloride flush   Vital Signs    Vitals:   01/27/19 1626 01/27/19 1929 01/28/19 0349 01/28/19 0748  BP: 128/86 114/64 (!) 99/54 134/74  Pulse: 64 (!) 59 (!) 50 (!) 52  Resp:  19 18 19   Temp: 97.8 F (36.6 C) 98.2 F (36.8 C) (!) 97.4 F (36.3 C) 97.7 F (36.5 C)  TempSrc: Oral Oral Oral Oral  SpO2: 98% 96% 99% 97%  Weight:      Height:        Intake/Output Summary (Last 24 hours) at 01/28/2019 1009 Last data filed at 01/28/2019 0946 Gross per 24 hour  Intake 581.98 ml  Output 700 ml  Net -118.02 ml   Filed Weights   01/26/19 1205 01/27/19 0343  Weight: 75.3 kg 60.9 kg      Telemetry    SR - Personally Reviewed  ECG    No new tracings- Personally Reviewed  Physical Exam   GEN: No acute distress.  Lying in bed and watching television.  Neck: No JVD Cardiac: RRR, 2/6 systolic murmur, no rubs or gallops.  Respiratory:  Moderately decreased breath sounds throughout GI: Soft, nontender, non-distended  MS: S/p L AKA.  No swelling right lower extremity Neuro:  Nonfocal, some intermittent confusion noted Psych: Normal affect   Labs    Chemistry Recent Labs  Lab 01/26/19 1202 01/27/19 0237 01/28/19 0427  NA 139 142 143  K 3.6 3.7 3.7  CL 105 106 111  CO2 24 26 22   GLUCOSE 199* 137* 116*  BUN 20 26* 22  CREATININE 1.29* 1.55* 1.27*  CALCIUM 8.5* 8.7* 8.5*  GFRNONAA 54* 43* 55*  GFRAA >60 50* >60  ANIONGAP 10 10 10      Hematology Recent Labs  Lab 01/26/19 1202 01/27/19 0237 01/28/19 0427  WBC 10.0 11.5* 9.9  RBC 4.55 4.23 4.05*  HGB 13.0 11.9* 11.3*  HCT 39.9 37.7* 35.2*  MCV 87.7 89.1 86.9  MCH 28.6 28.1 27.9  MCHC 32.6 31.6 32.1  RDW 14.7 14.7 14.6  PLT 221  200 206    Cardiac Enzymes Recent Labs  Lab 01/26/19 1202 01/26/19 1545 01/26/19 2040 01/27/19 0237  TROPONINI <0.03 <0.03 <0.03 <0.03   No results for input(s): TROPIPOC in the last 168 hours.   BNPNo results for input(s): BNP, PROBNP in the last 168 hours.   DDimer No results for input(s): DDIMER in the last 168 hours.   Radiology    Nm Myocar Multi W/spect W/wall Motion / Ef  Result Date: 01/27/2019  Abnormal, potentially high risk pharmacologic myocardial perfusion stress test.  There is a large in size, severe, fixed inferior and inferolateral defect extending to the apex consistent with scar.  No significant ischemia is identified, though sensititivity is reduced by the extent of prior infarct.  The left ventricular ejection fraction is moderately to severely reduced (LVEF 20% by QGS and 37% by Siemens calculation). LVEF noted to be 40-45% on  yesterday's echocardiogram.  Attenuation correction CT is notable for post-CABG and AVR findings. The gallbladder is surgically absent.    Dg Chest Port 1 View  Result Date: 01/26/2019 CLINICAL DATA:  Chest pain since this morning which has resolved. Diarrhea yesterday. EXAM: PORTABLE CHEST 1 VIEW COMPARISON:  01/18/2004 FINDINGS: Lungs are adequately inflated without lobar consolidation or effusion. Mild prominence of the central bronchovascular markings which may be due to an acute bronchitic process. Cardiomediastinal silhouette and remainder of the exam is unchanged. IMPRESSION: Minimal prominence of the central bronchovascular markings which may be due to an acute bronchitic process. Electronically Signed   By: Marin Olp M.D.   On: 01/26/2019 12:21    Cardiac Studies   TTE 01/26/2019 1. The left ventricle has mild-moderately reduced systolic function, with an ejection fraction of 40-45%. The cavity size was mildly dilated. Left ventricular diastolic Doppler parameters are consistent with impaired relaxation. The study is not  adequate for wall motion abnormalities.  2. The right ventricle has normal systolic function. The cavity was normal. There is no increase in right ventricular wall thickness.  3. Left atrial size was mildly dilated.  4. The mitral valve is degenerative. Moderate calcification of the mitral valve leaflet. There is severe mitral annular calcification present.  5. The tricuspid valve is grossly normal.  6. The aortic valve is tricuspid. Moderate calcification of the aortic valve. Aortic valve regurgitation was not assessed by color flow Doppler mild-moderate stenosis of the aortic valve. Mean gradient of 13 mm Hg.  7. No pulmonic valve vegetation visualized.  Patient Profile     73 y.o. male history of CAD s/p CABG in 1994, chronic chest pain, self reported PAF, hypertension, hyperlipidemia, PAD s/p left AKA, DM2, GERD, and depression who is being seen for the  evaluation of chest pain, ruled out negative cardiac enzymes x3, stress test yesterday old inferior MI no significant ischemia  Assessment & Plan    Chest pain with stable angina No further episodes of chest pain, start her on isosorbide Stress test yesterday with no ischemia only large region inferior infarct - S/p CABG in [place], [place] in 1994.  multiple risk factors for cardiac etiology / comorbid conditions including h/o smoking, HTN, DM2, and HLD with PAD.  --Would continue current medications, isosorbide as written -Would discharge with sublingual nitroglycerin prescription  HTN  -Continue current medications listed, blood pressure well controlled this morning  HLD - Continue statin therapy with atorvastatin 80mg  daily Goal LDL less than 70  Anemia  Consider anemia of chronic disease with CKD as below.  We will need periodic  outpatient monitoring as he is on Xarelto  CKD  -We will need outpatient monitoring No catheterization at this time  PAD s/p L AKA - Continue statin    Patient reported PAF, currently SR - Reports h/o PAF (unable to obtain confirmation via EMR) with current palpitations and feelings of racing HR and SOB.  Would continue Xarelto 15mg  daily  reduced dosage as CrCl under 50.  -Also on low-dose aspirin  DM2 Continue Metformin, outpatient medications  COPD Still smoking  1 ppd usage. - Smoking cessation advised.  - Continue inhalers as needed.    Discharge instructions provided, discussed management of stable angina Discussed smoking cessation more than 5 minutes, discussed renal dysfunction, A. Fib Discussion of anticoagulation  Total encounter time more than 35 minutes  Greater than 50% was spent in counseling and coordination of care with the patient   For questions or updates, please contact [place] Please consult [email or web address] for contact info under        Signed, Ida Rogue, MD  01/28/2019, 10:09 AM

## 2019-01-28 NOTE — TOC Transition Note (Signed)
Transition of Care [facility]) - CM/SW Discharge Note   Patient Details  Name: Killian Schwer MRN: [id number] Date of Birth: July 23, 1946  Transition of Care [facility]) CM/SW Contact:  Elza Rafter, RN Phone Number: 01/28/2019, 12:08 PM   Clinical Narrative:   Discharging to [facility] today via EMS.  Nelda Severe, RN aware to call report.      Final next level of care: Assisted Living Barriers to Discharge: No Barriers Identified   Patient Goals and CMS Choice Patient states their goals for this hospitalization and ongoing recovery are:: Go back to [facility]F CMS Medicare.gov Compare Post Acute Care list provided to:: Patient Choice offered to / list presented to : Patient  Discharge Placement                       Discharge Plan and Services   Discharge Planning Services: CM Consult                HH Arranged: Patient Refused [facility]     Social Determinants of Health (SDOH) Interventions     Readmission Risk Interventions No flowsheet data found.

## 2019-01-28 NOTE — Care Management (Signed)
Spoke with Helene Kelp at [facility] and confirmed she received discharge summary and FL2 and accepting patient.  Patient will need to transport via EMS as he does not have any family to transport him.  EMS packet placed on chart.  RN can call report to [facility] @ [phone].

## 2019-01-28 NOTE — TOC Initial Note (Signed)
Transition of Care [facility]) - Initial/Assessment Note    Patient Details  Name: Shawn Jennings MRN: [id number] Date of Birth: 11/18/1945  Transition of Care [facility]) CM/SW Contact:    Elza Rafter, RN Phone Number: 01/28/2019, 10:50 AM  Clinical Narrative:         Patient is from [facility].  Admitted for CP and SOB.  Afebrile; negative troponin's.  Discharging today.  Called and spoke with Lisabeth Register at [facility].  I notified her that he was able to DC today and she said they could not accept him until a negative COVID test was confirmed.  I explained that he was not suspected of having COVID as he has been afebrile and remains on RA and that would be a waste of resources.  I told her I would have the MD call her.  Spoke with Mr. Hilligoss and he is oriented X4; sitting up in bed; no SOB, smiling and holding a conversation without difficulty.  He expresses that he feels well.  Obtains medications and medical care at [facility] without difficulty.  Uses a wheelchair as he has a AKA to left leg.  FL2 complete.  Helene Kelp called this RN back and explained they will take him back without COVID testing as he has been afebrile.  Will fax DC summary and FL2.              Expected Discharge Plan: Assisted Living Barriers to Discharge: No Barriers Identified   Patient Goals and CMS Choice Patient states their goals for this hospitalization and ongoing recovery are:: Go back to [facility] CMS Medicare.gov Compare Post Acute Care list provided to:: Patient Choice offered to / list presented to : Patient  Expected Discharge Plan and Services Expected Discharge Plan: Assisted Living   Discharge Planning Services: CM Consult   Living arrangements for the past 2 months: [place] Expected Discharge Date: 01/28/19                   [facility] Arranged: Patient Refused HH    Prior Living Arrangements/Services Living arrangements for the past 2 months: [place] Lives with:: Self Patient language and need for interpreter reviewed:: Yes Do you feel safe going back to the place where you live?: Yes            Criminal Activity/Legal Involvement Pertinent to Current Situation/Hospitalization: No - Comment as needed  Activities of Daily Living Home Assistive Devices/Equipment: Wheelchair, Other (Comment)(Assisted Living) ADL Screening (condition at time of admission) Patient's cognitive ability adequate to safely complete daily activities?: Yes Is the patient deaf or have difficulty hearing?: No Does the patient have difficulty seeing, even when wearing glasses/contacts?: No Does the patient have difficulty concentrating, remembering, or making decisions?: No Patient able to express need for assistance with ADLs?: Yes Does the patient have difficulty dressing or bathing?: Yes Independently performs ADLs?: No Communication: Needs assistance Is this a change from baseline?: Pre-admission baseline Dressing (OT): Needs assistance Is this a change from baseline?: Pre-admission baseline Grooming: Needs assistance Is this a change from baseline?: Pre-admission baseline Feeding: Independent Bathing: Needs assistance Is this a change from baseline?: Pre-admission baseline Toileting: Needs assistance Is this a change from baseline?: Pre-admission baseline In/Out Bed: Needs assistance Is this a change from baseline?: Pre-admission baseline Walks in Home: Needs assistance Is this a change from baseline?: Pre-admission baseline Does the patient have difficulty walking or climbing stairs?: Yes Weakness of Legs: Right(Left AKA) Weakness  of Arms/Hands: None  Permission Sought/Granted Permission sought to share information with : Investment banker, corporate granted to share info w AGENCY: [facility]        Emotional Assessment Appearance:: Appears stated age Attitude/Demeanor/Rapport: Gracious Affect (typically  observed): Accepting Orientation: : Oriented to Self, Oriented to Place, Oriented to  Time, Oriented to Situation Alcohol / Substance Use: Not Applicable    Admission diagnosis:  Nonspecific chest pain [R07.9] Patient Active Problem List   Diagnosis Date Noted  . Chest pain 01/27/2019  . Renal insufficiency   . Nonspecific chest pain 01/26/2019   PCP:  [facility] Pharmacy:  No Pharmacies Listed    Social Determinants of Health (SDOH) Interventions    Readmission Risk Interventions No flowsheet data found.

## 2019-01-28 NOTE — Discharge Summary (Addendum)
[facility] [place] NAME: Shawn Jennings    MR#:  [id number]  DATE OF BIRTH:  01/23/1946  DATE OF ADMISSION:  01/26/2019 ADMITTING PHYSICIAN: Saundra Shelling, MD  DATE OF DISCHARGE: 01/28/2019  PRIMARY CARE PHYSICIAN: [facility]   ADMISSION DIAGNOSIS:  Nonspecific chest pain [R07.9]  DISCHARGE DIAGNOSIS:  Chest pain  Hypertension Hyperlipidemia Peripheral arterial disease with left AKA CKD stage III  SECONDARY DIAGNOSIS:   Past Medical History:  Diagnosis Date  . CAD (coronary artery disease)    a. 1994 s/p CABG ([place], [place]); b. Reports multiple stress tests over the years.  . CKD (chronic kidney disease), stage III ([place])   . COPD (chronic obstructive pulmonary disease) ([place])   . Depression   . Essential hypertension   . GERD (gastroesophageal reflux disease)   . Hyperlipidemia LDL goal <70   . PAD (peripheral artery disease) ([place])    a. 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  Marland Kitchen PAF (paroxysmal atrial fibrillation) ([facility])    a. CHA2DS2VASc = 4-->Xarelto 15mg  daily in setting of CKD.  Marland Kitchen Thyrotoxicosis   . Tobacco abuse   . Type II diabetes mellitus ([place])      ADMITTING HISTORY Shawn Jennings  is a 73 y.o. male with a known history of GERD, diabetes mellitus type 2, depression presented to the emergency room for chest pain.  Patient has history of MI and CABG. pain is located retrosternally 5 out of 10 on a scale of 1-10 sharp in nature.  First set of troponin is negative.  Hospitalist service was consulted.  HOSPITAL COURSE:  Patient was admitted to telemetry.  Serial troponins were negative.  Patient was started on aspirin and nitrates.  Was evaluated by cardiology.  Patient was worked up with cardiac stress test.No significant ischemia is identified, though sensititivity is reduced by the extent of prior infarct.  EF is 40 to 45% on stress test.  Chest pain resolved.  Cardiology recommended no further intervention and only medical  management.  Patient hemodynamically stable will be discharged back to [facility] facility.  CONSULTS OBTAINED:  Treatment Team:  Wellington [place], MD  DRUG ALLERGIES:  No Known Allergies  DISCHARGE MEDICATIONS:   Allergies as of 01/28/2019   No Known Allergies     Medication List    TAKE these medications   acetaminophen 500 MG tablet Commonly known as:  TYLENOL Take 500 mg by mouth 3 (three) times daily.   albuterol (2.5 MG/3ML) 0.083% nebulizer solution Commonly known as:  PROVENTIL Take 2.5 mg by nebulization every 4 (four) hours as needed for wheezing or shortness of breath.   Ventolin HFA 108 (90 Base) MCG/ACT inhaler Generic drug:  albuterol Inhale 2 puffs into the lungs every 6 (six) hours as needed for wheezing or shortness of breath.   aspirin EC 81 MG tablet Take 81 mg by mouth daily.   atorvastatin 80 MG tablet Commonly known as:  LIPITOR Take 80 mg by mouth at bedtime.   DULoxetine 30 MG capsule Commonly known as:  CYMBALTA Take 30 mg by mouth 2 (two) times daily.   ferrous sulfate 325 (65 FE) MG tablet Take 325 mg by mouth daily.   Fluticasone-Salmeterol 250-50 MCG/DOSE Aepb Commonly known as:  ADVAIR Inhale 1 puff into the lungs 2 (two) times daily.   furosemide 80 MG tablet Commonly known as:  LASIX Take 80 mg by mouth 2 (two) times daily.   ipratropium 17 MCG/ACT inhaler Commonly known as:  ATROVENT HFA Inhale 2 puffs into the lungs every 6 (six) hours as needed for wheezing.   isosorbide mononitrate 30 MG 24 hr tablet Commonly known as:  IMDUR Take 0.5 tablets (15 mg total) by mouth daily for 30 days. Start taking on:  January 29, 2019   lactulose 10 GM/15ML solution Commonly known as:  CHRONULAC Take 30 g by mouth every 12 (twelve) hours as needed for mild constipation.   lamoTRIgine 25 MG tablet Commonly known as:  LAMICTAL Take 25 mg by mouth 2 (two) times daily.   lidocaine 5 % Commonly known as:  LIDODERM Cut 1 patch in half  and apply to amputated stump once daily 12 hours on, 12 hours off   liver oil-zinc oxide 40 % ointment Commonly known as:  DESITIN Apply 1 application topically at bedtime. To buttocks   loratadine 10 MG tablet Commonly known as:  CLARITIN Take 10 mg by mouth daily.   LORazepam 0.5 MG tablet Commonly known as:  ATIVAN Take 0.5 mg by mouth 2 (two) times daily as needed for anxiety.   Melatonin 3 MG Tabs Take 1 tablet by mouth at bedtime.   metFORMIN 500 MG tablet Commonly known as:  GLUCOPHAGE Take 500 mg by mouth daily.   nicotine 14 mg/24hr patch Commonly known as:  NICODERM CQ - dosed in mg/24 hours Place 14 mg onto the skin daily.   nitroGLYCERIN 0.4 MG SL tablet Commonly known as:  NITROSTAT Place 0.4 mg under the tongue every 5 (five) minutes as needed for chest pain.   Oxycodone HCl 10 MG Tabs Take 1 tablet (10 mg total) by mouth every 8 (eight) hours as needed (pain). What changed:  when to take this   pantoprazole 40 MG tablet Commonly known as:  PROTONIX Take 40 mg by mouth daily.   potassium chloride 10 MEQ tablet Commonly known as:  K-DUR,KLOR-CON Take 10 mEq by mouth daily.   PreserVision AREDS 2 Caps Take 1 capsule by mouth 2 (two) times daily.   Rivaroxaban 15 MG Tabs tablet Commonly known as:  XARELTO Take 15 mg by mouth daily.   senna 8.6 MG tablet Commonly known as:  SENOKOT Take 2 tablets by mouth at bedtime.   tamsulosin 0.4 MG Caps capsule Commonly known as:  FLOMAX Take 0.4 mg by mouth daily.   traZODone 50 MG tablet Commonly known as:  DESYREL Take 25 mg by mouth at bedtime as needed for sleep.   triamcinolone cream 0.1 % Commonly known as:  KENALOG Apply 1 application topically 2 (two) times daily.       Today  Patient seen today No complaints of chest pain Tolerating diet well Hemodynamically stable Will be discharged back to [place] [facility] facility VITAL SIGNS:  Blood pressure 134/74, pulse (!) 52, temperature 97.7 F  (36.5 C), temperature source Oral, resp. rate 19, height 6' (1.829 m), weight 60.9 kg, SpO2 97 %.  I/O:    Intake/Output Summary (Last 24 hours) at 01/28/2019 1109 Last data filed at 01/28/2019 0946 Gross per 24 hour  Intake 581.98 ml  Output 700 ml  Net -118.02 ml    PHYSICAL EXAMINATION:  Physical Exam  GENERAL:  73 y.o.-year-old patient lying in the bed with no acute distress.  LUNGS: Normal breath sounds bilaterally, no wheezing, rales,rhonchi or crepitation. No use of accessory muscles of respiration.  CARDIOVASCULAR: S1, S2 normal. No murmurs, rubs, or gallops.  ABDOMEN: Soft, non-tender, non-distended. Bowel sounds present. No organomegaly or mass.  Extremities :  Left AKA NEUROLOGIC: Moves all 4 extremities. PSYCHIATRIC: The patient is alert and oriented x 3.  SKIN: No obvious rash, lesion, or ulcer.   DATA REVIEW:   CBC Recent Labs  Lab 01/28/19 0427  WBC 9.9  HGB 11.3*  HCT 35.2*  PLT 206    Chemistries  Recent Labs  Lab 01/27/19 0237 01/28/19 0427  NA 142 143  K 3.7 3.7  CL 106 111  CO2 26 22  GLUCOSE 137* 116*  BUN 26* 22  CREATININE 1.55* 1.27*  CALCIUM 8.7* 8.5*  MG 2.2  --     Cardiac Enzymes Recent Labs  Lab 01/27/19 0237  TROPONINI <0.03    Microbiology Results  Results for orders placed or performed during the hospital encounter of 01/26/19  MRSA PCR Screening     Status: Abnormal   Collection Time: 01/27/19 12:56 AM  Result Value Ref Range Status   MRSA by PCR POSITIVE (A) NEGATIVE Final    Comment:        The GeneXpert MRSA Assay (FDA approved for NASAL specimens only), is one component of a comprehensive MRSA colonization surveillance program. It is not intended to diagnose MRSA infection nor to guide or monitor treatment for MRSA infections. RESULT CALLED TO, READ BACK BY AND VERIFIED WITHMalcolm Metro RN [phone] 01/27/2019 HNM Performed at [facility], [address] [place], [place] [address]     RADIOLOGY:   Nm Myocar Multi W/spect W/wall Motion / Ef  Result Date: 01/27/2019  Abnormal, potentially high risk pharmacologic myocardial perfusion stress test.  There is a large in size, severe, fixed inferior and inferolateral defect extending to the apex consistent with scar.  No significant ischemia is identified, though sensititivity is reduced by the extent of prior infarct.  The left ventricular ejection fraction is moderately to severely reduced (LVEF 20% by QGS and 37% by Siemens calculation). LVEF noted to be 40-45% on yesterday's echocardiogram.  Attenuation correction CT is notable for post-CABG and AVR findings. The gallbladder is surgically absent.    Dg Chest Port 1 View  Result Date: 01/26/2019 CLINICAL DATA:  Chest pain since this morning which has resolved. Diarrhea yesterday. EXAM: PORTABLE CHEST 1 VIEW COMPARISON:  01/18/2004 FINDINGS: Lungs are adequately inflated without lobar consolidation or effusion. Mild prominence of the central bronchovascular markings which may be due to an acute bronchitic process. Cardiomediastinal silhouette and remainder of the exam is unchanged. IMPRESSION: Minimal prominence of the central bronchovascular markings which may be due to an acute bronchitic process. Electronically Signed   By: Marin Olp M.D.   On: 01/26/2019 12:21    Follow up with PCP in 1 week.  Management plans discussed with the patient, family and they are in agreement.  CODE STATUS: Full code    Code Status Orders  (From admission, onward)         Start     Ordered   01/26/19 1440  Full code  Continuous     01/26/19 1439        Code Status History    This patient has a current code status but no historical code status.      TOTAL TIME TAKING CARE OF THIS PATIENT ON DAY OF DISCHARGE: more than 35 minutes.   Saundra Shelling M.D on 01/28/2019 at 11:09 AM  Between 7am to 6pm - Pager - [phone]  After 6pm go to [email or web address] - password [email or web address]  [facility]  Office  [phone]  CC: Primary  care physician; [facility]  Note: This dictation was prepared with Dragon dictation along with smaller phrase technology. Any transcriptional errors that result from this process are unintentional.

## 2019-02-05 ENCOUNTER — Telehealth: Payer: Self-pay

## 2019-02-05 NOTE — Telephone Encounter (Signed)
Left a message with Lorene Dy with [facility] to contact our office to discuss setting up the Webex visit for Mr. Gieselman's appointment with Dr. Fletcher Anon on February 12, 2019.

## 2019-02-06 NOTE — Telephone Encounter (Signed)
Kaitlyn returning call  Wants to test video visit with nurse Please call [phone]

## 2019-02-06 NOTE — Telephone Encounter (Signed)
Do you mind trying with [email or web address]?

## 2019-02-06 NOTE — Telephone Encounter (Signed)
Spoke with Verline Lema at [facility] and  the people to assist with the video visit were not available until after 9 am , she asked for this appointment to be scheduled for a later time. After consulting with Lars Pinks this appointment was rescheduled for an afternoon appointment.

## 2019-02-12 ENCOUNTER — Telehealth: Payer: Medicare Other | Admitting: Cardiovascular Disease

## 2019-02-19 ENCOUNTER — Other Ambulatory Visit: Payer: Self-pay

## 2019-02-19 ENCOUNTER — Telehealth: Payer: Medicare Other | Admitting: Cardiovascular Disease

## 2019-02-19 NOTE — Progress Notes (Unsigned)
{Choose 1 Note Type (Telehealth Visit or Telephone Visit):[phone]}   Evaluation Performed:  Follow-up visit  Date:  02/19/2019   ID:  Shawn Jennings, DOB Apr 14, 1946, MRN [id number]  {Patient [phone]::"Home"} {Provider Location:[phone]}  PCP:  [facility]  Cardiologist:  [name], MD *** Electrophysiologist:  None   Chief Complaint:  ***  History of Present Illness:    Shawn Jennings is a 73 y.o. male with *** h/o CAD s/p remote CABG in [name], [place] in 1994.  Other history includes HTN, HL, DMII, COPD, tobacco abuse, possibly PAF (he isn't sure but is on xarelto), PAD s/p L AKA, GERD, depression, and CKD.  He continues to smoke 1 ppd and lives in an assisted living in [place], [place].  He is a very poor historian with inconsistent reported symptoms.  He was hospitalized at [facility] earlier this month with atypical chest pain.  He ruled out for myocardial infarction.  He had an echocardiogram done which showed an EF of 40 to 45% with mild aortic stenosis.  He underwent Lexiscan Myoview which showed large scar in the inferior and inferolateral area without ischemia.  EF was 37%.  He was felt to be a poor candidate for cardiac cath although it was offered and the patient declined.  He was treated medically.   The patient {does/does not:200015} have symptoms concerning for COVID-19 infection (fever, chills, cough, or new shortness of breath).    Past Medical History:  Diagnosis Date  . CAD (coronary artery disease)    a. 1994 s/p CABG ([place], [place]); b. Reports multiple stress tests over the years.  . CKD (chronic kidney disease), stage III ([place])   . COPD (chronic obstructive pulmonary disease) ([place])   . Depression   . Essential hypertension   . GERD (gastroesophageal reflux disease)   . Hyperlipidemia LDL goal <70   . PAD (peripheral artery disease) ([place])    a. 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  Marland Kitchen PAF (paroxysmal atrial fibrillation) (HCC)    a. CHA2DS2VASc = 4-->Xarelto 15mg   daily in setting of CKD.  Marland Kitchen Thyrotoxicosis   . Tobacco abuse   . Type II diabetes mellitus ([place])    Past Surgical History:  Procedure Laterality Date  . CORONARY ARTERY BYPASS GRAFT     a. [place], [place]  . Left AKA       No outpatient medications have been marked as taking for the 02/19/19 encounter (Appointment) with Wellington Hampshire, MD.     Allergies:   Patient has no known allergies.   Social History   Tobacco Use  . Smoking status: Current Every Day Smoker    Packs/day: 1.00    Years: 62.00    Pack years: 62.00  . Smokeless tobacco: Never Used  Substance Use Topics  . Alcohol use: Never    Frequency: Never  . Drug use: Never     Family Hx: The patient's family history includes CAD in his sister; Heart attack in his mother; Heart disease in his brother; Other in his father, sister, and sister.  ROS:   Please see the history of present illness.    *** All other systems reviewed and are negative.   Prior CV studies:   The following studies were reviewed today:  ***  Labs/Other Tests and Data Reviewed:    EKG:  {EKG/Telemetry Strips Reviewed:[phone]}  Recent Labs: 01/27/2019: Magnesium 2.2 01/28/2019: BUN 22; Creatinine, Ser 1.27; Hemoglobin 11.3; Platelets 206; Potassium 3.7; Sodium 143   Recent Lipid Panel Lab Results  Component Value Date/Time   CHOL 143 01/27/2019 02:37 AM   TRIG 152 (H) 01/27/2019 02:37 AM   HDL 51 01/27/2019 02:37 AM   CHOLHDL 2.8 01/27/2019 02:37 AM   LDLCALC 62 01/27/2019 02:37 AM    Wt Readings from Last 3 Encounters:  01/27/19 134 lb 3.2 oz (60.9 kg)     Objective:    Vital Signs:  There were no vitals taken for this visit.   {HeartCare Virtual Exam (Optional):[id number]::"VITAL SIGNS:  reviewed"}  ASSESSMENT & PLAN:    Chest pain with stable angina No further episodes of chest pain, start her on isosorbide Stress test yesterday with no ischemia only large region inferior infarct - S/p CABG in [place] in  1994.  multiple risk factors for cardiac etiology / comorbid conditions including h/o smoking, HTN, DM2, and HLD with PAD.  --Would continue current medications, isosorbide as written -Would discharge with sublingual nitroglycerin prescription  HTN  -Continue current medications listed, blood pressure well controlled this morning  HLD - Continue statin therapy with atorvastatin 80mg  daily Goal LDL less than 70  Anemia  Consider anemia of chronic disease with CKD as below.  We will need periodic outpatient monitoring as he is on Xarelto  CKD  -We will need outpatient monitoring No catheterization at this time  PAD s/p L AKA - Continue statin    Patient reported PAF, currently SR - Reports h/o PAF (unable to obtain confirmation via EMR) with current palpitations and feelings of racing HR and SOB.  Would continue Xarelto 15mg  daily  reduced dosage as CrCl under 50.  -Also on low-dose aspirin  DM2 Continue Metformin, outpatient medications  COPD Still smoking  1 ppd usage.  COVID-19 Education: The signs and symptoms of COVID-19 were discussed with the patient and how to seek care for testing (follow up with PCP or arrange E-visit).  ***The importance of social distancing was discussed today.  Time:   Today, I have spent *** minutes with the patient with telehealth technology discussing the above problems.     Medication Adjustments/Labs and Tests Ordered: Current medicines are reviewed at length with the patient today.  Concerns regarding medicines are outlined above.   Tests Ordered: No orders of the defined types were placed in this encounter.   Medication Changes: No orders of the defined types were placed in this encounter.   Disposition:  Follow up {follow up:15908}  Signed, [name], MD  02/19/2019 2:43 PM    [facility]

## 2019-02-20 ENCOUNTER — Other Ambulatory Visit: Payer: Self-pay

## 2019-02-20 ENCOUNTER — Telehealth: Payer: Self-pay

## 2019-02-20 ENCOUNTER — Encounter: Payer: Self-pay | Admitting: Cardiovascular Disease

## 2019-02-20 ENCOUNTER — Telehealth (INDEPENDENT_AMBULATORY_CARE_PROVIDER_SITE_OTHER): Payer: Medicare Other | Admitting: Cardiovascular Disease

## 2019-02-20 VITALS — BP 136/79 | HR 69 | Ht 65.0 in | Wt 148.0 lb

## 2019-02-20 DIAGNOSIS — I25118 Atherosclerotic heart disease of native coronary artery with other forms of angina pectoris: Secondary | ICD-10-CM

## 2019-02-20 NOTE — Patient Instructions (Signed)
Medication Instructions:  Continue same medications If you need a refill on your cardiac medications before your next appointment, please call your pharmacy.   Lab work: None If you have labs (blood work) drawn today and your tests are completely normal, you will receive your results only by: . MyChart Message (if you have MyChart) OR . A paper copy in the mail If you have any lab test that is abnormal or we need to change your treatment, we will call you to review the results.  Testing/Procedures: None  Follow-Up: At [facility], you and your health needs are our priority.  As part of our continuing mission to provide you with exceptional heart care, we have created designated Provider Care Teams.  These Care Teams include your primary Cardiologist (physician) and Advanced Practice Providers (APPs -  Physician Assistants and Nurse Practitioners) who all work together to provide you with the care you need, when you need it. You will need a follow up appointment in 4 months.  Please call our office 2 months in advance to schedule this appointment.  You may see Moss Berry, MD or one of the following Advanced Practice Providers on your designated Care Team:   Christopher Berge, NP Ryan Dunn, PA-C . Jacquelyn Visser, PA-C  

## 2019-02-20 NOTE — Progress Notes (Signed)
Virtual Visit via Video Note   This visit type was conducted due to national recommendations for restrictions regarding the COVID-19 Pandemic (e.g. social distancing) in an effort to limit this patient's exposure and mitigate transmission in our community.  Due to his co-morbid illnesses, this patient is at least at moderate risk for complications without adequate follow up.  This format is felt to be most appropriate for this patient at this time.  All issues noted in this document were discussed and addressed.  A limited physical exam was performed with this format.  Please refer to the patient's chart for his consent to telehealth for Shawn Jennings.   Evaluation Performed:  Follow-up visit  Date:  02/20/2019   ID:  Shawn Jennings, DOB [birth year], MRN [id number]  Patient Location: Assisted living facility Provider Location: Office  PCP:  [facility]  Cardiologist:  [name], MD  Electrophysiologist:  None   Chief Complaint: No complaints today  History of Present Illness:    Shawn Jennings is a 73 y.o. male with was seen via video visit to follow-up regarding recent hospitalization for chest pain. He has h/o CAD s/p remote CABG in [place], [place] in 1994.  Other history includes HTN, HL, DMII, COPD, tobacco abuse, possibly PAF (he isn't sure but is on xarelto), PAD s/p L AKA, GERD, depression, and CKD.  He continues to smoke 1 ppd and lives in an assisted living in [place], [place].  He is a very poor historian with inconsistent reported symptoms.  He was hospitalized at [facility] earlier this month with atypical chest pain.  He ruled out for myocardial infarction.  He had an echocardiogram done which showed an EF of 40 to 45% with mild aortic stenosis.  He underwent Lexiscan Myoview which showed large scar in the inferior and inferolateral area without ischemia.  EF was 37%.  He was felt to be a poor candidate for cardiac cath although it was offered and the patient declined.  He was  treated medically. He reports no further chest pain or shortness of breath since Jennings discharge.  He has no complaints today.  Although he has a prosthesis, he is mostly wheelchair-bound.   The patient does not have symptoms concerning for COVID-19 infection (fever, chills, cough, or new shortness of breath).    Past Medical History:  Diagnosis Date  . CAD (coronary artery disease)    a. 1994 s/p CABG ([place], [place]); b. Reports multiple stress tests over the years.  . CKD (chronic kidney disease), stage III ([place])   . COPD (chronic obstructive pulmonary disease) ([place])   . Depression   . Essential hypertension   . GERD (gastroesophageal reflux disease)   . Hyperlipidemia LDL goal <70   . PAD (peripheral artery disease) ([place])    a. 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  Marland Kitchen PAF (paroxysmal atrial fibrillation) (HCC)    a. CHA2DS2VASc = 4-->Xarelto 15mg  daily in setting of CKD.  Marland Kitchen Thyrotoxicosis   . Tobacco abuse   . Type II diabetes mellitus ([place])    Past Surgical History:  Procedure Laterality Date  . CORONARY ARTERY BYPASS GRAFT     a. [place], [place]  . Left AKA       No outpatient medications have been marked as taking for the 02/20/19 encounter (Appointment) with Wellington Hampshire, MD.     Allergies:   Patient has no known allergies.   Social History   Tobacco Use  . Smoking status: Current Every Day Smoker  Packs/day: 1.00    Years: 62.00    Pack years: 62.00  . Smokeless tobacco: Never Used  Substance Use Topics  . Alcohol use: Never    Frequency: Never  . Drug use: Never     Family Hx: The patient's family history includes CAD in his sister; Heart attack in his mother; Heart disease in his brother; Other in his father, sister, and sister.  ROS:   Please see the history of present illness.     All other systems reviewed and are negative.   Prior CV studies:   The following studies were reviewed today:  I reviewed recent results of stress test and echocardiogram with him   Labs/Other Tests and Data Reviewed:    EKG:  No ECG reviewed.  Recent Labs: 01/27/2019: Magnesium 2.2 01/28/2019: BUN 22; Creatinine, Ser 1.27; Hemoglobin 11.3; Platelets 206; Potassium 3.7; Sodium 143   Recent Lipid Panel Lab Results  Component Value Date/Time   CHOL 143 01/27/2019 02:37 AM   TRIG 152 (H) 01/27/2019 02:37 AM   HDL 51 01/27/2019 02:37 AM   CHOLHDL 2.8 01/27/2019 02:37 AM   LDLCALC 62 01/27/2019 02:37 AM    Wt Readings from Last 3 Encounters:  01/27/19 134 lb 3.2 oz (60.9 kg)     Objective:    Vital Signs:  There were no vitals taken for this visit.   VITAL SIGNS:  reviewed GEN:  no acute distress EYES:  sclerae anicteric, EOMI - Extraocular Movements Intact RESPIRATORY:  normal respiratory effort, symmetric expansion CARDIOVASCULAR:  no peripheral edema SKIN:  no rash, lesions or ulcers. MUSCULOSKELETAL:  no obvious deformities. NEURO:  alert and oriented x 3, no obvious focal deficit PSYCH:  normal affect  Left BKA  ASSESSMENT & PLAN:    1.  Coronary artery disease involving native coronary arteries with stable angina: He reports no further chest pain since Jennings discharge.  He is currently on long-acting nitroglycerin . I agree that he is not a good candidate for invasive cardiac work-up  2.  Essential hypertension: Blood pressure is controlled.    3.  Hyperlipidemia: Continue high-dose atorvastatin.    4.  Peripheral arterial disease status post left AKA.    5.  Reported history of paroxysmal atrial fibrillation: On Xarelto 15 mg daily.  COVID-19 Education: The signs and symptoms of COVID-19 were discussed with the patient and how to seek care for testing (follow up with PCP or arrange E-visit).  The importance of social distancing was discussed today.  Time:   Today, I have spent 20 minutes with the patient with telehealth technology discussing the above problems.     Medication Adjustments/Labs and Tests Ordered: Current medicines are  reviewed at length with the patient today.  Concerns regarding medicines are outlined above.   Tests Ordered: No orders of the defined types were placed in this encounter.   Medication Changes: No orders of the defined types were placed in this encounter.   Disposition:  Follow up in 4 month(s)  Signed, [name], MD  02/20/2019 8:21 AM    [place]

## 2019-02-20 NOTE — Telephone Encounter (Signed)
Virtual Visit Pre-Appointment Phone Call  "(Name), I am calling you today to discuss your upcoming appointment. We are currently trying to limit exposure to the virus that causes COVID-19 by seeing patients at home rather than in the office."  1. "What is the BEST phone number to call the day of the visit?" - include this in appointment notes  2. "Do you have or have access to (through a family member/friend) a smartphone with video capability that we can use for your visit?" a. If yes - list this number in appt notes as "cell" (if different from BEST phone #) and list the appointment type as a VIDEO visit in appointment notes b. If no - list the appointment type as a PHONE visit in appointment notes  3. Confirm consent - "In the setting of the current Covid19 crisis, you are scheduled for a (phone or video) visit with your provider on (date) at (time).  Just as we do with many in-office visits, in order for you to participate in this visit, we must obtain consent.  If you'd like, I can send this to your mychart (if signed up) or email for you to review.  Otherwise, I can obtain your verbal consent now.  All virtual visits are billed to your insurance company just like a normal visit would be.  By agreeing to a virtual visit, we'd like you to understand that the technology does not allow for your provider to perform an examination, and thus may limit your provider's ability to fully assess your condition. If your provider identifies any concerns that need to be evaluated in person, we will make arrangements to do so.  Finally, though the technology is pretty good, we cannot assure that it will always work on either your or our end, and in the setting of a video visit, we may have to convert it to a phone-only visit.  In either situation, we cannot ensure that we have a secure connection.  Are you willing to proceed?" STAFF: Did the patient verbally acknowledge consent to telehealth visit? Document  YES/NO here: Yes   4. Advise patient to be prepared - "Two hours prior to your appointment, go ahead and check your blood pressure, pulse, oxygen saturation, and your weight (if you have the equipment to check those) and write them all down. When your visit starts, your provider will ask you for this information. If you have an Apple Watch or Kardia device, please plan to have heart rate information ready on the day of your appointment. Please have a pen and paper handy nearby the day of the visit as well."  5. Give patient instructions for MyChart download to smartphone OR Doximity/Doxy.me as below if video visit (depending on what platform provider is using)  6. Inform patient they will receive a phone call 15 minutes prior to their appointment time (may be from unknown caller ID) so they should be prepared to answer    TELEPHONE CALL NOTE  Shawn Jennings has been deemed a candidate for a follow-up tele-health visit to limit community exposure during the Covid-19 pandemic. I spoke with the patient via phone to ensure availability of phone/video source, confirm preferred email & phone number, and discuss instructions and expectations.  I reminded Shawn Jennings to be prepared with any vital sign and/or heart rhythm information that could potentially be obtained via home monitoring, at the time of his visit. I reminded Shawn Jennings to expect a phone call prior to his visit.  Dolores Lory, [facility] 02/20/2019 8:27 AM   INSTRUCTIONS FOR DOWNLOADING THE MYCHART APP TO SMARTPHONE  - The patient must first make sure to have activated MyChart and know their login information - If Apple, go to [employer] and type in MyChart in the search bar and download the app. If Android, ask patient to go to [employer] and type in [place] in the search bar and download the app. The app is free but as with any other app downloads, their phone may require them to verify saved payment information or Apple/Android  password.  - The patient will need to then log into the app with their MyChart username and password, and select [facility] as their healthcare provider to link the account. When it is time for your visit, go to the MyChart app, find appointments, and click Begin Video Visit. Be sure to Select Allow for your device to access the Microphone and Camera for your visit. You will then be connected, and your provider will be with you shortly.  **If they have any issues connecting, or need assistance please contact MyChart service desk [phone])**  **If using a computer, in order to ensure the best quality for their visit they will need to use either of the following Internet Browsers: [employer], or Google Chrome**  IF USING DOXIMITY or DOXY.ME - The patient will receive a link just prior to their visit by text.     FULL LENGTH CONSENT FOR TELE-HEALTH VISIT   I hereby voluntarily request, consent and authorize [facility] and its employed or contracted physicians, physician assistants, nurse practitioners or other licensed health care professionals (the Practitioner), to provide me with telemedicine health care services (the "Services") as deemed necessary by the treating Practitioner. I acknowledge and consent to receive the Services by the Practitioner via telemedicine. I understand that the telemedicine visit will involve communicating with the Practitioner through live audiovisual communication technology and the disclosure of certain medical information by electronic transmission. I acknowledge that I have been given the opportunity to request an in-person assessment or other available alternative prior to the telemedicine visit and am voluntarily participating in the telemedicine visit.  I understand that I have the right to withhold or withdraw my consent to the use of telemedicine in the course of my care at any time, without affecting my right to future care or treatment,  and that the Practitioner or I may terminate the telemedicine visit at any time. I understand that I have the right to inspect all information obtained and/or recorded in the course of the telemedicine visit and may receive copies of available information for a reasonable fee.  I understand that some of the potential risks of receiving the Services via telemedicine include:  Marland Kitchen Delay or interruption in medical evaluation due to technological equipment failure or disruption; . Information transmitted may not be sufficient (e.g. poor resolution of images) to allow for appropriate medical decision making by the Practitioner; and/or  . In rare instances, security protocols could fail, causing a breach of personal health information.  Furthermore, I acknowledge that it is my responsibility to provide information about my medical history, conditions and care that is complete and accurate to the best of my ability. I acknowledge that Practitioner's advice, recommendations, and/or decision may be based on factors not within their control, such as incomplete or inaccurate data provided by me or distortions of diagnostic images or specimens that may result from electronic transmissions. I understand that the  practice of medicine is not an Chief Strategy Officer and that Practitioner makes no warranties or guarantees regarding treatment outcomes. I acknowledge that I will receive a copy of this consent concurrently upon execution via email to the email address I last provided but may also request a printed copy by calling the office of [place].    I understand that my insurance will be billed for this visit.   I have read or had this consent read to me. . I understand the contents of this consent, which adequately explains the benefits and risks of the Services being provided via telemedicine.  . I have been provided ample opportunity to ask questions regarding this consent and the Services and have had my questions  answered to my satisfaction. . I give my informed consent for the services to be provided through the use of telemedicine in my medical care  By participating in this telemedicine visit I agree to the above.

## 2019-12-11 ENCOUNTER — Other Ambulatory Visit: Payer: Self-pay

## 2019-12-11 ENCOUNTER — Emergency Department: Payer: Medicare Other

## 2019-12-11 ENCOUNTER — Encounter: Payer: Self-pay | Admitting: Emergency Medicine

## 2019-12-11 ENCOUNTER — Emergency Department
Admission: EM | Admit: 2019-12-11 | Discharge: 2019-12-12 | Disposition: A | Discharge status: Home / Self Care | Payer: Medicare Other | Attending: Emergency Medicine | Admitting: Emergency Medicine

## 2019-12-11 DIAGNOSIS — F17200 Nicotine dependence, unspecified, uncomplicated: Secondary | ICD-10-CM | POA: Diagnosis not present

## 2019-12-11 DIAGNOSIS — I129 Hypertensive chronic kidney disease with stage 1 through stage 4 chronic kidney disease, or unspecified chronic kidney disease: Secondary | ICD-10-CM | POA: Diagnosis not present

## 2019-12-11 DIAGNOSIS — I2510 Atherosclerotic heart disease of native coronary artery without angina pectoris: Secondary | ICD-10-CM | POA: Insufficient documentation

## 2019-12-11 DIAGNOSIS — N183 Chronic kidney disease, stage 3 unspecified: Secondary | ICD-10-CM | POA: Diagnosis not present

## 2019-12-11 DIAGNOSIS — Z7982 Long term (current) use of aspirin: Secondary | ICD-10-CM | POA: Diagnosis not present

## 2019-12-11 DIAGNOSIS — Z7984 Long term (current) use of oral hypoglycemic drugs: Secondary | ICD-10-CM | POA: Insufficient documentation

## 2019-12-11 DIAGNOSIS — J449 Chronic obstructive pulmonary disease, unspecified: Secondary | ICD-10-CM | POA: Diagnosis not present

## 2019-12-11 DIAGNOSIS — R079 Chest pain, unspecified: Secondary | ICD-10-CM

## 2019-12-11 DIAGNOSIS — Z7901 Long term (current) use of anticoagulants: Secondary | ICD-10-CM | POA: Diagnosis not present

## 2019-12-11 DIAGNOSIS — E1122 Type 2 diabetes mellitus with diabetic chronic kidney disease: Secondary | ICD-10-CM | POA: Diagnosis not present

## 2019-12-11 DIAGNOSIS — Z79899 Other long term (current) drug therapy: Secondary | ICD-10-CM | POA: Diagnosis not present

## 2019-12-11 DIAGNOSIS — Z951 Presence of aortocoronary bypass graft: Secondary | ICD-10-CM | POA: Insufficient documentation

## 2019-12-11 LAB — CBC
HCT: 39.1 % (ref 39.0–52.0)
Hemoglobin: 13.1 g/dL (ref 13.0–17.0)
MCH: 28.2 pg (ref 26.0–34.0)
MCHC: 33.5 g/dL (ref 30.0–36.0)
MCV: 84.1 fL (ref 80.0–100.0)
Platelets: 258 10*3/uL (ref 150–400)
RBC: 4.65 MIL/uL (ref 4.22–5.81)
RDW: 14 % (ref 11.5–15.5)
WBC: 11.6 10*3/uL — ABNORMAL HIGH (ref 4.0–10.5)
nRBC: 0 % (ref 0.0–0.2)

## 2019-12-11 LAB — BASIC METABOLIC PANEL
Anion gap: 17 — ABNORMAL HIGH (ref 5–15)
BUN: 20 mg/dL (ref 8–23)
CO2: 23 mmol/L (ref 22–32)
Calcium: 8.7 mg/dL — ABNORMAL LOW (ref 8.9–10.3)
Chloride: 100 mmol/L (ref 98–111)
Creatinine, Ser: 1.47 mg/dL — ABNORMAL HIGH (ref 0.61–1.24)
GFR calc Af Amer: 53 mL/min — ABNORMAL LOW (ref >60)
GFR calc non Af Amer: 46 mL/min — ABNORMAL LOW (ref >60)
Glucose, Bld: 159 mg/dL — ABNORMAL HIGH (ref 70–99)
Potassium: 3.4 mmol/L — ABNORMAL LOW (ref 3.5–5.1)
Sodium: 140 mmol/L (ref 135–145)

## 2019-12-11 LAB — TROPONIN I (HIGH SENSITIVITY)
Troponin I (High Sensitivity): 21 ng/L — ABNORMAL HIGH (ref <18)
Troponin I (High Sensitivity): 23 ng/L — ABNORMAL HIGH (ref <18)

## 2019-12-11 MED ORDER — SODIUM CHLORIDE 0.9% FLUSH: 3.0000 mL | Freq: Once | INTRAVENOUS | Status: DC

## 2019-12-11 MED ORDER — FENTANYL CITRATE (PF) 100 MCG/2ML IJ SOLN
50.0000 ug | Freq: Once | INTRAMUSCULAR | Status: AC
  Administered 2019-12-12: 50 ug via INTRAVENOUS
  Filled 2019-12-11: qty 2

## 2019-12-11 MED ORDER — OXYCODONE HCL 5 MG PO TABS
10.0000 mg | ORAL_TABLET | Freq: Once | ORAL | Status: AC
  Administered 2019-12-11: 10 mg via ORAL
  Filled 2019-12-11: qty 2

## 2019-12-11 MED ORDER — LIDOCAINE VISCOUS HCL 2 % MT SOLN
15.0000 mL | Freq: Once | OROMUCOSAL | Status: AC
  Administered 2019-12-11: 15 mL via OROMUCOSAL
  Filled 2019-12-11: qty 15

## 2019-12-11 NOTE — ED Notes (Signed)
Pt tongue is cover in white, pt states that it is painful, unsure how long this has been going on.

## 2019-12-11 NOTE — ED Notes (Signed)
Pt with DG tech att

## 2019-12-11 NOTE — ED Provider Notes (Signed)
[facility] Emergency Department Provider Note  ____________________________________________   I have reviewed the triage vital signs and the nursing notes.   HISTORY  Chief Complaint Chest Pain   History limited by: Not Limited   HPI Shawn Jennings is a 74 y.o. male who presents to the emergency department today because of concerns for chest pain.  He states that the pain started this evening.  Located in the center chest.  He describes it as a pressure-like pain.  It lasted for about an hour.  It was associated with some sweating.  Patient states he had a similar episode 6 months ago and was seen for it without any obvious etiology.  He does have history of bypass. Denies any fevers or recent illness. Was given dose of nitroglycerin without any relief.    Records reviewed. Per medical record review patient has a history of admission roughly 10 months ago with negative stress test and troponin.   Past Medical History:  Diagnosis Date  . CAD (coronary artery disease)    a. 1994 s/p CABG ([place], [place]); b. Reports multiple stress tests over the years.  . CKD (chronic kidney disease), stage III   . COPD (chronic obstructive pulmonary disease) ([place])   . Depression   . Essential hypertension   . GERD (gastroesophageal reflux disease)   . Hyperlipidemia LDL goal <70   . PAD (peripheral artery disease) ([place])    a. 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  Marland Kitchen PAF (paroxysmal atrial fibrillation) (HCC)    a. CHA2DS2VASc = 4-->Xarelto 15mg  daily in setting of CKD.  Marland Kitchen Thyrotoxicosis   . Tobacco abuse   . Type II diabetes mellitus [facility])     Patient Active Problem List   Diagnosis Date Noted  . Chest pain 01/27/2019  . Renal insufficiency   . Nonspecific chest pain 01/26/2019    Past Surgical History:  Procedure Laterality Date  . CORONARY ARTERY BYPASS GRAFT     a. [place], [place]  . Left AKA      Prior to Admission medications   Medication Sig Start Date End Date Taking?  Authorizing Provider  acetaminophen (TYLENOL) 500 MG tablet Take 500 mg by mouth every 8 (eight) hours as needed for mild pain or fever.    Yes [provider]  aspirin EC 81 MG tablet Take 81 mg by mouth daily.   Yes [provider]  atorvastatin (LIPITOR) 80 MG tablet Take 80 mg by mouth at bedtime.   Yes [provider]  DULoxetine (CYMBALTA) 30 MG capsule Take 30 mg by mouth 2 (two) times daily.   Yes [provider]  ferrous sulfate 325 (65 FE) MG tablet Take 325 mg by mouth daily.   Yes [provider]  Fluticasone-Salmeterol (ADVAIR) 250-50 MCG/DOSE AEPB Inhale 1 puff into the lungs 2 (two) times daily.   Yes [provider]  furosemide (LASIX) 80 MG tablet Take 80 mg by mouth 2 (two) times daily.   Yes [provider]  hydrOXYzine (ATARAX/VISTARIL) 25 MG tablet Take 25 mg by mouth every 6 (six) hours as needed (allergy symptoms).   Yes [provider]  ketotifen (ZADITOR) 0.025 % ophthalmic solution Place 1 drop into both eyes 2 (two) times daily.   Yes [provider]  lamoTRIgine (LAMICTAL) 25 MG tablet Take 25 mg by mouth 2 (two) times daily.   Yes [provider]  lidocaine (LIDODERM) 5 % Cut 1 patch in half and apply to amputated stump  once daily 12 hours on, 12 hours off   Yes [provider]  Melatonin 3 MG TABS Take 1 tablet by mouth at bedtime.   Yes [provider]  metFORMIN (GLUCOPHAGE) 500 MG tablet Take 500 mg by mouth 2 (two) times daily with a meal.    Yes [provider]  montelukast (SINGULAIR) 10 MG tablet Take 10 mg by mouth at bedtime.   Yes [provider]  Multiple Vitamins-Minerals (PRESERVISION AREDS 2) CAPS Take 1 capsule by mouth 2 (two) times daily.   Yes [provider]  nicotine (NICODERM CQ - DOSED IN MG/24 HOURS) 14 mg/24hr patch Place 14 mg onto the skin daily.   Yes [provider]  pantoprazole (PROTONIX) 40 MG  tablet Take 40 mg by mouth daily.   Yes [provider]  potassium chloride (MICRO-K) 10 MEQ CR capsule Take 10 mEq by mouth daily. 12/11/19  Yes [provider]  Rivaroxaban (XARELTO) 15 MG TABS tablet Take 15 mg by mouth daily.   Yes [provider]  senna (SENOKOT) 8.6 MG tablet Take 2 tablets by mouth at bedtime.   Yes [provider]  tamsulosin (FLOMAX) 0.4 MG CAPS capsule Take 0.4 mg by mouth daily.   Yes [provider]  traZODone (DESYREL) 50 MG tablet Take 25 mg by mouth at bedtime as needed for sleep.   Yes [provider]  triamcinolone cream (KENALOG) 0.1 % Apply 1 application topically 2 (two) times daily.   Yes [provider]  albuterol (PROVENTIL) (2.5 MG/3ML) 0.083% nebulizer solution Take 2.5 mg by nebulization every 4 (four) hours as needed for wheezing or shortness of breath.    [provider]  albuterol (VENTOLIN HFA) 108 (90 Base) MCG/ACT inhaler Inhale 2 puffs into the lungs every 6 (six) hours as needed for wheezing or shortness of breath.    [provider]  Fluticasone-Salmeterol (ADVAIR DISKUS) 250-50 MCG/DOSE AEPB  05/08/17   [provider]  isosorbide mononitrate (IMDUR) 30 MG 24 hr tablet Take 0.5 tablets (15 mg total) by mouth daily for 30 days. Patient taking differently: Take 30 mg by mouth daily.  01/29/19 02/28/19  Saundra Shelling, MD  lactulose (CHRONULAC) 10 GM/15ML solution Take 30 g by mouth every 12 (twelve) hours as needed for mild constipation.    [provider]  liver oil-zinc oxide (DESITIN) 40 % ointment Apply 1 application topically at bedtime as needed for irritation. To buttocks     [provider]  loratadine (CLARITIN) 10 MG tablet Take 10 mg by mouth daily as needed for allergies.     [provider]  LORazepam (ATIVAN) 0.5 MG tablet Take 0.5 mg by mouth 2 (two) times daily as needed for anxiety.    [provider]  nitroGLYCERIN  (NITROSTAT) 0.4 MG SL tablet Place 0.4 mg under the tongue every 5 (five) minutes as needed for chest pain.    [provider]  Oxycodone HCl 10 MG TABS Take 1 tablet (10 mg total) by mouth every 8 (eight) hours as needed (pain). Patient not taking: Reported on 12/11/2019 01/28/19   Saundra Shelling, MD    Allergies Patient has no known allergies.  Family History  Problem Relation Age of Onset  . Heart attack Mother        died @ 71.  . Other Father        never knew his father.  . Other Sister        overdose  of sleeping pills.  . Heart disease Brother        died in his 60's  . Other Sister        complication of abd surgery @ 77  . CAD Sister     Social History Social History   Tobacco Use  . Smoking status: Current Every Day Smoker    Packs/day: 1.00    Years: 62.00    Pack years: 62.00  . Smokeless tobacco: Never Used  Substance Use Topics  . Alcohol use: Never  . Drug use: Never    Review of Systems Constitutional: No fever/chills Eyes: No visual changes. ENT: No sore throat. Cardiovascular: Positive for chest pain. Respiratory: Denies shortness of breath. Gastrointestinal: No abdominal pain.  No nausea, no vomiting.  No diarrhea.   Genitourinary: Negative for dysuria. Musculoskeletal: Negative for back pain. Skin: Negative for rash. Neurological: Negative for headaches, focal weakness or numbness.  ____________________________________________   PHYSICAL EXAM:  VITAL SIGNS: ED Triage Vitals  Enc Vitals Group     BP 12/11/19 1845 114/62     Pulse Rate 12/11/19 1845 99     Resp 12/11/19 1845 16     Temp 12/11/19 1845 98.7 F (37.1 C)     Temp Source 12/11/19 1845 Oral     SpO2 12/11/19 1845 94 %     Weight 12/11/19 1846 147 lb (66.7 kg)     Height 12/11/19 1846 5' 11.65" (1.82 m)     Head Circumference --      Peak Flow --      Pain Score 12/11/19 1845 7   Constitutional: Alert and oriented.  Eyes: Conjunctivae are normal.  ENT       Head: Normocephalic and atraumatic.      Nose: No congestion/rhinnorhea.      Mouth/Throat: Mucous membranes are moist.      Neck: No stridor. Hematological/Lymphatic/Immunilogical: No cervical lymphadenopathy. Cardiovascular: Normal rate, regular rhythm.  No murmurs, rubs, or gallops.  Respiratory: Normal respiratory effort without tachypnea nor retractions. Breath sounds are clear and equal bilaterally. No wheezes/rales/rhonchi. Gastrointestinal: Soft and non tender. No rebound. No guarding.  Genitourinary: Deferred Musculoskeletal: Normal range of motion in all extremities. Left AKA. Neurologic:  Normal speech and language. No gross focal neurologic deficits are appreciated.  Skin:  Skin is warm, dry and intact. No rash noted. Psychiatric: Mood and affect are normal. Speech and behavior are normal. Patient exhibits appropriate insight and judgment.  ____________________________________________    LABS (pertinent positives/negatives)  Trop hs 21 CBC wbc 11.6, hgb 13.1, plt 258 BMP na 140, k 3.4, glu 159, cr 1.47  ____________________________________________   EKG  I, Nance Pear, attending physician, personally viewed and interpreted this EKG  EKG Time: 1840 Rate: 101 Rhythm: sinus tachycardia with pvc Axis: normal Intervals: qtc 497 QRS: narrow ST changes: no st elevation Impression: abnormal ekg   ____________________________________________    RADIOLOGY  CXR No acute disease   ____________________________________________   PROCEDURES  Procedures  ____________________________________________   INITIAL IMPRESSION / ASSESSMENT AND PLAN / ED COURSE  Pertinent labs & imaging results that were available during my care of the patient were reviewed by me and considered in my medical decision making (see chart for details).   Patient presented to the emergency department because of concerns for chest pain.  Patient states he had a similar episode roughly  10 months ago.  Per chart review was admitted to the hospital at that time with serial negative troponins as well  as a nonconcerning stress test.  2 troponins here without any significant change between them and only mild elevation.  At this point I doubt that the pain signifies a cardiac event.  Will attempt pain control here in the emergency department.  However if unable to obtain pain control do think would be reasonable for admission. ____________________________________________   FINAL CLINICAL IMPRESSION(S) / ED DIAGNOSES  Final diagnoses:  Nonspecific chest pain     Note: This dictation was prepared with Dragon dictation. Any transcriptional errors that result from this process are unintentional     Nance Pear, MD 12/12/19 0005

## 2019-12-11 NOTE — ED Triage Notes (Signed)
Pt to ED via [facility] from [facility] for pain that started about 1-2 hours PTA. Pt states that he was sitting when the pain started. Pt was given nitro in route by EMS without improvement in the pain. Pt currently rates the pain 7/10. Pt has cardiac hx with stent placement. Pt is in NAD at this time.

## 2019-12-12 DIAGNOSIS — R079 Chest pain, unspecified: Secondary | ICD-10-CM | POA: Diagnosis not present

## 2019-12-12 NOTE — Discharge Instructions (Addendum)
Please seek medical attention for any high fevers, chest pain, shortness of breath, change in behavior, persistent vomiting, bloody stool or any other new or concerning symptoms.  

## 2019-12-12 NOTE — ED Notes (Signed)
Peripheral IV discontinued. Catheter intact. No signs of infiltration or redness. Gauze applied to IV site.   Discharge instructions reviewed with patient. Questions fielded by this RN. Patient verbalizes understanding of instructions. Patient discharged home in stable condition per sung. No acute distress noted at time of discharge.   Pt dressed; no belonging at bedside, leaving with EMS

## 2019-12-12 NOTE — ED Provider Notes (Signed)
-----------------------------------------   1:37 AM on 12/12/2019 -----------------------------------------  Patient fell asleep after administration of IV fentanyl.  Feeling significantly better and ready for discharge home.  Strict return precautions given.  Patient verbalizes understanding agrees with plan of care.   Paulette Blanch, MD 12/12/19 [place]

## 2019-12-12 NOTE — ED Notes (Signed)
Pt reports incontinence and requested condom catheter  Pt c/o CP mid sternum sharp and non radiating

## 2020-02-12 ENCOUNTER — Encounter: Payer: Self-pay | Admitting: Cardiovascular Disease

## 2020-02-12 ENCOUNTER — Telehealth (INDEPENDENT_AMBULATORY_CARE_PROVIDER_SITE_OTHER): Payer: Medicare Other | Admitting: Cardiovascular Disease

## 2020-02-12 ENCOUNTER — Other Ambulatory Visit: Payer: Self-pay

## 2020-02-12 VITALS — Ht 72.0 in | Wt 147.0 lb

## 2020-02-12 DIAGNOSIS — I739 Peripheral vascular disease, unspecified: Secondary | ICD-10-CM

## 2020-02-12 DIAGNOSIS — I10 Essential (primary) hypertension: Secondary | ICD-10-CM

## 2020-02-12 DIAGNOSIS — I2510 Atherosclerotic heart disease of native coronary artery without angina pectoris: Secondary | ICD-10-CM

## 2020-02-12 DIAGNOSIS — I480 Paroxysmal atrial fibrillation: Secondary | ICD-10-CM

## 2020-02-12 DIAGNOSIS — F17210 Nicotine dependence, cigarettes, uncomplicated: Secondary | ICD-10-CM

## 2020-02-12 NOTE — Patient Instructions (Signed)
Medication Instructions:  Continue same medications *If you need a refill on your cardiac medications before your next appointment, please call your pharmacy*   Lab Work: None If you have labs (blood work) drawn today and your tests are completely normal, you will receive your results only by: Marland Kitchen MyChart Message (if you have MyChart) OR . A paper copy in the mail If you have any lab test that is abnormal or we need to change your treatment, we will call you to review the results.   Testing/Procedures: None   Follow-Up: At [facility], you and your health needs are our priority.  As part of our continuing mission to provide you with exceptional heart care, we have created designated Provider Care Teams.  These Care Teams include your primary Cardiologist (physician) and Advanced Practice Providers (APPs -  Physician Assistants and Nurse Practitioners) who all work together to provide you with the care you need, when you need it.  We recommend signing up for the patient portal called "MyChart".  Sign up information is provided on this After Visit Summary.  MyChart is used to connect with patients for Virtual Visits (Telemedicine).  Patients are able to view lab/test results, encounter notes, upcoming appointments, etc.  Non-urgent messages can be sent to your provider as well.   To learn more about what you can do with MyChart, go to [email or web address].    Your next appointment:   6 month(s)  The format for your next appointment:   In Person  Provider:    You may see [name], MD or one of the following Advanced Practice Providers on your designated Care Team:    Murray Hodgkins, NP  Christell Faith, PA-C  Marrianne Mood, PA-C

## 2020-02-12 NOTE — Progress Notes (Signed)
Virtual Visit via Telphone Note   This visit type was conducted due to national recommendations for restrictions regarding the COVID-19 Pandemic (e.g. social distancing) in an effort to limit this patient's exposure and mitigate transmission in our community.  Due to his co-morbid illnesses, this patient is at least at moderate risk for complications without adequate follow up.  This format is felt to be most appropriate for this patient at this time.  All issues noted in this document were discussed and addressed.  A limited physical exam was performed with this format.  Please refer to the patient's chart for his consent to telehealth for [facility].   Evaluation Performed:  Follow-up visit  Date:  02/12/2020   ID:  Shawn Jennings, DOB 1946/07/14, MRN [id number]  Patient Location: Assisted living facility Provider Location: Office  PCP:  [facility]  Cardiologist:  [name], MD  Electrophysiologist:  None   Chief Complaint: No complaints today  History of Present Illness:    Shawn Jennings is a 74 y.o. male with was reached via phone for follow-up regarding extensive cardiac issues.  He has h/o CAD s/p remote CABG in [place], [place] in 1994.  Other history includes HTN, HL, DMII, COPD, tobacco abuse, possibly PAF (he isn't sure but is on xarelto), PAD s/p L AKA, GERD, depression, and CKD.  He continues to smoke 1 ppd and lives in an assisted living in [place], [place].  He is a very poor historian with inconsistent reported symptoms.  He was hospitalized at [facility] in April 2020 with atypical chest pain.  He ruled out for myocardial infarction.  He had an echocardiogram done which showed an EF of 40 to 45% with mild aortic stenosis.  He underwent Lexiscan Myoview which showed large scar in the inferior and inferolateral area without ischemia.  EF was 37%.  He was felt to be a poor candidate for cardiac cath although it was offered and the patient declined.  He was treated  medically.  Although he has a prosthesis, he is mostly wheelchair-bound. He had an emergency room visit in February for chest pain with negative troponin and no EKG changes.  Symptoms resolved with fentanyl and did not require admission. He reports no further episodes of chest pain.  Shortness of breath is stable.  He takes furosemide 80 mg twice daily.   The patient does not have symptoms concerning for COVID-19 infection (fever, chills, cough, or new shortness of breath).    Past Medical History:  Diagnosis Date  . CAD (coronary artery disease)    a. 1994 s/p CABG ([place], [place]); b. Reports multiple stress tests over the years.  . CKD (chronic kidney disease), stage III   . COPD (chronic obstructive pulmonary disease) ([place])   . Depression   . Essential hypertension   . GERD (gastroesophageal reflux disease)   . Hyperlipidemia LDL goal <70   . PAD (peripheral artery disease) ([place])    a. 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  Marland Kitchen PAF (paroxysmal atrial fibrillation) ([facility])    a. CHA2DS2VASc = 4-->Xarelto 15mg  daily in setting of CKD.  Marland Kitchen Thyrotoxicosis   . Tobacco abuse   . Type II diabetes mellitus ([place])    Past Surgical History:  Procedure Laterality Date  . CORONARY ARTERY BYPASS GRAFT     a. [place], [place]  . Left AKA       Current Meds  Medication Sig  . albuterol (VENTOLIN HFA) 108 (90 Base) MCG/ACT inhaler Inhale 2 puffs into  the lungs every 6 (six) hours as needed for wheezing or shortness of breath.  Marland Kitchen atorvastatin (LIPITOR) 80 MG tablet Take 80 mg by mouth at bedtime.  . DULoxetine (CYMBALTA) 30 MG capsule Take 30 mg by mouth 2 (two) times daily.  . ferrous sulfate 325 (65 FE) MG tablet Take 325 mg by mouth daily.  . Fluticasone-Salmeterol (ADVAIR) 250-50 MCG/DOSE AEPB Inhale 1 puff into the lungs 2 (two) times daily.  . furosemide (LASIX) 80 MG tablet Take 80 mg by mouth 2 (two) times daily.  . hydrOXYzine (ATARAX/VISTARIL) 25 MG tablet Take 25 mg by mouth every 6 (six) hours as needed  (allergy symptoms).  . isosorbide mononitrate (IMDUR) 30 MG 24 hr tablet Take 0.5 tablets (15 mg total) by mouth daily for 30 days. (Patient taking differently: Take 30 mg by mouth daily. )  . ketotifen (ZADITOR) 0.025 % ophthalmic solution Place 1 drop into both eyes 2 (two) times daily as needed.   . lactulose (CHRONULAC) 10 GM/15ML solution Take 30 g by mouth every 12 (twelve) hours as needed for mild constipation.  Marland Kitchen lamoTRIgine (LAMICTAL) 25 MG tablet Take 25 mg by mouth 2 (two) times daily.  Marland Kitchen lidocaine (LIDODERM) 5 % Cut 1 patch in half and apply to amputated stump once daily 12 hours on, 12 hours off  . liver oil-zinc oxide (DESITIN) 40 % ointment Apply 1 application topically at bedtime as needed for irritation. To buttocks   . loratadine (CLARITIN) 10 MG tablet Take 10 mg by mouth daily as needed for allergies.   Marland Kitchen LORazepam (ATIVAN) 0.5 MG tablet Take 0.5 mg by mouth 2 (two) times daily as needed for anxiety.  . Melatonin 3 MG TABS Take 1 tablet by mouth at bedtime.  . metFORMIN (GLUCOPHAGE) 500 MG tablet Take 500 mg by mouth 2 (two) times daily with a meal.   . montelukast (SINGULAIR) 10 MG tablet Take 10 mg by mouth at bedtime.  . nitroGLYCERIN (NITROSTAT) 0.4 MG SL tablet Place 0.4 mg under the tongue every 5 (five) minutes as needed for chest pain.  . pantoprazole (PROTONIX) 40 MG tablet Take 40 mg by mouth daily.  . potassium chloride (MICRO-K) 10 MEQ CR capsule Take 10 mEq by mouth daily.  . Rivaroxaban (XARELTO) 15 MG TABS tablet Take 15 mg by mouth daily.  Marland Kitchen senna (SENOKOT) 8.6 MG tablet Take 2 tablets by mouth at bedtime.  . tamsulosin (FLOMAX) 0.4 MG CAPS capsule Take 0.4 mg by mouth daily.  . traZODone (DESYREL) 50 MG tablet Take 25 mg by mouth at bedtime as needed for sleep.  Marland Kitchen triamcinolone cream (KENALOG) 0.1 % Apply 1 application topically 2 (two) times daily.     Allergies:   Patient has no known allergies.   Social History   Tobacco Use  . Smoking status:  Current Every Day Smoker    Packs/day: 1.00    Years: 62.00    Pack years: 62.00  . Smokeless tobacco: Never Used  Substance Use Topics  . Alcohol use: Never  . Drug use: Never     Family Hx: The patient's family history includes CAD in his sister; Heart attack in his mother; Heart disease in his brother; Other in his father, sister, and sister.  ROS:   Please see the history of present illness.     All other systems reviewed and are negative.   Prior CV studies:   The following studies were reviewed today:    Labs/Other Tests and Data Reviewed:  EKG:  No ECG reviewed.  Recent Labs: 12/11/2019: BUN 20; Creatinine, Ser 1.47; Hemoglobin 13.1; Platelets 258; Potassium 3.4; Sodium 140   Recent Lipid Panel Lab Results  Component Value Date/Time   CHOL 143 01/27/2019 02:37 AM   TRIG 152 (H) 01/27/2019 02:37 AM   HDL 51 01/27/2019 02:37 AM   CHOLHDL 2.8 01/27/2019 02:37 AM   LDLCALC 62 01/27/2019 02:37 AM    Wt Readings from Last 3 Encounters:  02/12/20 147 lb (66.7 kg)  12/11/19 147 lb (66.7 kg)  02/20/19 148 lb (67.1 kg)     Objective:    Vital Signs:  Ht 6' (1.829 m)   Wt 147 lb (66.7 kg)   BMI 19.94 kg/m    VITAL SIGNS:  reviewed GEN:  no acute distress EYES:  sclerae anicteric, EOMI - Extraocular Movements Intact RESPIRATORY:  normal respiratory effort, symmetric expansion CARDIOVASCULAR:  no peripheral edema SKIN:  no rash, lesions or ulcers. MUSCULOSKELETAL:  no obvious deformities. NEURO:  alert and oriented x 3, no obvious focal deficit PSYCH:  normal affect  Left BKA  ASSESSMENT & PLAN:    1.  Coronary artery disease involving native coronary arteries with stable angina: He reports no further chest pain He is currently on long-acting nitroglycerin . Given comorbidities, recommend conservative treatment unless he has acute coronary syndrome.  2.  Essential hypertension: Not able to check blood pressure today.  3.  Hyperlipidemia: Continue  high-dose atorvastatin 80 mg daily.  4.  Peripheral arterial disease status post left AKA.    5.  Reported history of paroxysmal atrial fibrillation: On Xarelto 15 mg daily.  I reviewed his labs done in February which showed normal CBC and stable renal function.  6.  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  COVID-19 Education: The signs and symptoms of COVID-19 were discussed with the patient and how to seek care for testing (follow up with PCP or arrange E-visit).  The importance of social distancing was discussed today.  Time:   Today, I have spent 10 minutes with the patient with telehealth technology discussing the above problems.     Medication Adjustments/Labs and Tests Ordered: Current medicines are reviewed at length with the patient today.  Concerns regarding medicines are outlined above.   Tests Ordered: No orders of the defined types were placed in this encounter.   Medication Changes: No orders of the defined types were placed in this encounter.   Disposition: In person follow-up in 6 months.  Signed, [name], MD  02/12/2020 9:27 AM    [facility]

## 2020-03-08 ENCOUNTER — Emergency Department: Payer: Medicare Other

## 2020-03-08 ENCOUNTER — Emergency Department
Admission: EM | Admit: 2020-03-08 | Discharge: 2020-03-08 | Disposition: A | Discharge status: Home / Self Care | Payer: Medicare Other | Source: Home / Self Care | Attending: Emergency Medicine | Admitting: Emergency Medicine

## 2020-03-08 ENCOUNTER — Other Ambulatory Visit: Payer: Self-pay

## 2020-03-08 DIAGNOSIS — R0789 Other chest pain: Secondary | ICD-10-CM | POA: Diagnosis not present

## 2020-03-08 DIAGNOSIS — I25118 Atherosclerotic heart disease of native coronary artery with other forms of angina pectoris: Secondary | ICD-10-CM | POA: Diagnosis not present

## 2020-03-08 DIAGNOSIS — R079 Chest pain, unspecified: Secondary | ICD-10-CM

## 2020-03-08 LAB — CBC
HCT: 37.1 % — ABNORMAL LOW (ref 39.0–52.0)
Hemoglobin: 12.1 g/dL — ABNORMAL LOW (ref 13.0–17.0)
MCH: 27.8 pg (ref 26.0–34.0)
MCHC: 32.6 g/dL (ref 30.0–36.0)
MCV: 85.3 fL (ref 80.0–100.0)
Platelets: 223 10*3/uL (ref 150–400)
RBC: 4.35 MIL/uL (ref 4.22–5.81)
RDW: 14.3 % (ref 11.5–15.5)
WBC: 10.7 10*3/uL — ABNORMAL HIGH (ref 4.0–10.5)
nRBC: 0 % (ref 0.0–0.2)

## 2020-03-08 LAB — TROPONIN I (HIGH SENSITIVITY)
Troponin I (High Sensitivity): 28 ng/L — ABNORMAL HIGH (ref <18)
Troponin I (High Sensitivity): 28 ng/L — ABNORMAL HIGH (ref <18)

## 2020-03-08 LAB — BASIC METABOLIC PANEL
Anion gap: 10 (ref 5–15)
BUN: 21 mg/dL (ref 8–23)
CO2: 25 mmol/L (ref 22–32)
Calcium: 8.2 mg/dL — ABNORMAL LOW (ref 8.9–10.3)
Chloride: 106 mmol/L (ref 98–111)
Creatinine, Ser: 1.57 mg/dL — ABNORMAL HIGH (ref 0.61–1.24)
GFR calc Af Amer: 49 mL/min — ABNORMAL LOW (ref >60)
GFR calc non Af Amer: 42 mL/min — ABNORMAL LOW (ref >60)
Glucose, Bld: 140 mg/dL — ABNORMAL HIGH (ref 70–99)
Potassium: 3.6 mmol/L (ref 3.5–5.1)
Sodium: 141 mmol/L (ref 135–145)

## 2020-03-08 LAB — PROTIME-INR
INR: 1.1 (ref 0.8–1.2)
Prothrombin Time: 13.9 seconds (ref 11.4–15.2)

## 2020-03-08 MED ORDER — MORPHINE SULFATE (PF) 2 MG/ML IV SOLN
2.0000 mg | Freq: Once | INTRAVENOUS | Status: AC
  Administered 2020-03-08: 2 mg via INTRAVENOUS
  Filled 2020-03-08: qty 1

## 2020-03-08 MED ORDER — SODIUM CHLORIDE 0.9% FLUSH: 3.0000 mL | Freq: Once | INTRAVENOUS | Status: DC

## 2020-03-08 NOTE — ED Triage Notes (Addendum)
See first nurse note for more info, pt points to epigastric and states he started having pain about 2 hrs ago with diarrhea. Pt is a/ox3 at present. 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

## 2020-03-08 NOTE — Discharge Instructions (Addendum)
Please seek medical attention for any high fevers, chest pain, shortness of breath, change in behavior, persistent vomiting, bloody stool or any other new or concerning symptoms.  

## 2020-03-08 NOTE — ED Notes (Signed)
[facility]  CALLED  FOR  TRANSPORT   TO  [place]  HOUSE 

## 2020-03-08 NOTE — ED Provider Notes (Signed)
[facility] Emergency Department Provider Note  ____________________________________________   I have reviewed the triage vital signs and the nursing notes.   HISTORY  Chief Complaint Chest Pain (Epigastric)   History limited by: Not Limited   HPI Shawn Jennings is a 74 y.o. male who presents to the emergency department today because of concerns for chest pain.  He states that it is located in his lower center chest.  The pain started this morning when he was at the dining room.  It was accompanied by some shortness of breath.  By the time my exam however the pain was gone.  The patient states he has had similar pain in the past.  He does have a history of heart disease and states that this is what it feels like.  States he last had this 6 months ago when he was seen in this hospital. Patient denies any vomiting. No fevers.   Records reviewed. Per medical record review patient has a history of CAD, CKD, COPD, GERD.  Past Medical History:  Diagnosis Date  . CAD (coronary artery disease)    a. 1994 s/p CABG ([place], [place]); b. Reports multiple stress tests over the years.  . CKD (chronic kidney disease), stage III   . COPD (chronic obstructive pulmonary disease) ([place])   . Depression   . Essential hypertension   . GERD (gastroesophageal reflux disease)   . Hyperlipidemia LDL goal <70   . PAD (peripheral artery disease) ([place])    a. 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  Marland Kitchen PAF (paroxysmal atrial fibrillation) (HCC)    a. CHA2DS2VASc = 4-->Xarelto 15mg  daily in setting of CKD.  Marland Kitchen Thyrotoxicosis   . Tobacco abuse   . Type II diabetes mellitus [facility])     Patient Active Problem List   Diagnosis Date Noted  . Chest pain 01/27/2019  . Renal insufficiency   . Nonspecific chest pain 01/26/2019    Past Surgical History:  Procedure Laterality Date  . CORONARY ARTERY BYPASS GRAFT     a. [place], [place]  . Left AKA      Prior to Admission medications   Medication Sig Start Date  End Date Taking? Authorizing Provider  acetaminophen (TYLENOL) 500 MG tablet Take 500 mg by mouth every 8 (eight) hours as needed for mild pain or fever.     [provider]  albuterol (PROVENTIL) (2.5 MG/3ML) 0.083% nebulizer solution Take 2.5 mg by nebulization every 4 (four) hours as needed for wheezing or shortness of breath.    [provider]  albuterol (VENTOLIN HFA) 108 (90 Base) MCG/ACT inhaler Inhale 2 puffs into the lungs every 6 (six) hours as needed for wheezing or shortness of breath.    [provider]  aspirin EC 81 MG tablet Take 81 mg by mouth daily.    [provider]  atorvastatin (LIPITOR) 80 MG tablet Take 80 mg by mouth at bedtime.    [provider]  DULoxetine (CYMBALTA) 30 MG capsule Take 30 mg by mouth 2 (two) times daily.    [provider]  ferrous sulfate 325 (65 FE) MG tablet Take 325 mg by mouth daily.    [provider]  Fluticasone-Salmeterol (ADVAIR DISKUS) 250-50 MCG/DOSE AEPB Inhale 2 puffs into the lungs in the morning and at bedtime.  05/08/17   [provider]  Fluticasone-Salmeterol (ADVAIR) 250-50 MCG/DOSE AEPB Inhale 1 puff into the lungs 2 (two) times daily.    [provider]  furosemide (LASIX) 80  MG tablet Take 80 mg by mouth 2 (two) times daily.    [provider]  hydrOXYzine (ATARAX/VISTARIL) 25 MG tablet Take 25 mg by mouth every 6 (six) hours as needed (allergy symptoms).    [provider]  isosorbide mononitrate (IMDUR) 30 MG 24 hr tablet Take 0.5 tablets (15 mg total) by mouth daily for 30 days. Patient taking differently: Take 30 mg by mouth daily.  01/29/19 02/12/20  Saundra Shelling, MD  ketotifen (ZADITOR) 0.025 % ophthalmic solution Place 1 drop into both eyes 2 (two) times daily as needed.     [provider]  lactulose (CHRONULAC) 10 GM/15ML solution Take 30 g by mouth every 12 (twelve) hours as needed for mild constipation.    [provider]  lamoTRIgine (LAMICTAL) 25 MG tablet Take 25 mg by mouth 2 (two) times daily.    [provider]  lidocaine (LIDODERM) 5 % Cut 1 patch in half and apply to amputated stump once daily 12 hours on, 12 hours off    [provider]  liver oil-zinc oxide (DESITIN) 40 % ointment Apply 1 application topically at bedtime as needed for irritation. To buttocks     [provider]  loratadine (CLARITIN) 10 MG tablet Take 10 mg by mouth daily as needed for allergies.     [provider]  LORazepam (ATIVAN) 0.5 MG tablet Take 0.5 mg by mouth 2 (two) times daily as needed for anxiety.    [provider]  Melatonin 3 MG TABS Take 1 tablet by mouth at bedtime.    [provider]  metFORMIN (GLUCOPHAGE) 500 MG tablet Take 500 mg by mouth 2 (two) times daily with a meal.     [provider]  montelukast (SINGULAIR) 10 MG tablet Take 10 mg by mouth at bedtime.    [provider]  Multiple Vitamins-Minerals (PRESERVISION AREDS 2) CAPS Take 1 capsule by mouth 2 (two) times daily.    [provider]  nicotine (NICODERM CQ - DOSED IN MG/24 HOURS) 14 mg/24hr patch Place 14 mg onto the skin daily.    [provider]  nitroGLYCERIN (NITROSTAT) 0.4 MG SL tablet Place 0.4 mg under the tongue every 5 (five) minutes as needed for chest pain.    [provider]  Oxycodone HCl 10 MG TABS Take 1 tablet (10 mg total) by mouth every 8 (eight) hours as needed (pain). Patient not taking: Reported on 02/12/2020 01/28/19   Saundra Shelling, MD  pantoprazole (PROTONIX) 40 MG tablet Take 40 mg by mouth daily.    [provider]  potassium chloride (MICRO-K) 10 MEQ CR capsule Take 10 mEq by mouth daily. 12/11/19   [provider]  Rivaroxaban (XARELTO) 15 MG TABS tablet Take 15 mg by mouth daily.    [provider]  senna (SENOKOT) 8.6 MG tablet Take 2 tablets by mouth at bedtime.    [provider]   tamsulosin (FLOMAX) 0.4 MG CAPS capsule Take 0.4 mg by mouth daily.    [provider]  traZODone (DESYREL) 50 MG tablet Take 25 mg by mouth at bedtime as needed for sleep.    [provider]  triamcinolone cream (KENALOG) 0.1 % Apply 1 application topically 2 (two) times daily.    [provider]    Allergies Patient has no known allergies.  Family History  Problem Relation Age of Onset  . Heart attack Mother        died @ 61.  Marland Kitchen  Other Father        never knew his father.  . Other Sister        overdose of sleeping pills.  . Heart disease Brother        died in his 36's  . Other Sister        complication of abd surgery @ 33  . CAD Sister     Social History Social History   Tobacco Use  . Smoking status: Current Every Day Smoker    Packs/day: 1.00    Years: 62.00    Pack years: 62.00  . Smokeless tobacco: Never Used  Substance Use Topics  . Alcohol use: Never  . Drug use: Never    Review of Systems Constitutional: No fever/chills Eyes: No visual changes. ENT: No sore throat. Cardiovascular: Positive for chest pain. Respiratory: Positive for shortness of breath. Gastrointestinal: No abdominal pain.  No nausea, no vomiting.  No diarrhea.   Genitourinary: Negative for dysuria. Musculoskeletal: Negative for back pain. Skin: Negative for rash. Neurological: Negative for headaches, focal weakness or numbness.  ____________________________________________   PHYSICAL EXAM:  VITAL SIGNS: ED Triage Vitals  Enc Vitals Group     BP 03/08/20 1254 113/66     Pulse Rate 03/08/20 1254 66     Resp 03/08/20 1254 18     Temp 03/08/20 1257 98.1 F (36.7 C)     Temp Source 03/08/20 1254 Oral     SpO2 03/08/20 1254 95 %     Weight 03/08/20 1255 143 lb (64.9 kg)     Height 03/08/20 1255 6' (1.829 m)     Head Circumference --      Peak Flow --      Pain Score 03/08/20 1254 7   Constitutional: Alert and oriented.  Eyes: Conjunctivae are  normal.  ENT      Head: Normocephalic and atraumatic.      Nose: No congestion/rhinnorhea.      Mouth/Throat: Mucous membranes are moist.      Neck: No stridor. Hematological/Lymphatic/Immunilogical: No cervical lymphadenopathy. Cardiovascular: Normal rate, regular rhythm.  No murmurs, rubs, or gallops.  Respiratory: Normal respiratory effort without tachypnea nor retractions. Breath sounds are clear and equal bilaterally. No wheezes/rales/rhonchi. Gastrointestinal: Soft and non tender. No rebound. No guarding.  Genitourinary: Deferred Musculoskeletal: Normal range of motion in all extremities. S/p left aka. No peripheral edema of right lower extremity. Neurologic:  Normal speech and language. No gross focal neurologic deficits are appreciated.  Skin:  Skin is warm, dry and intact. No rash noted. Psychiatric: Mood and affect are normal. Speech and behavior are normal. Patient exhibits appropriate insight and judgment.  ____________________________________________    LABS (pertinent positives/negatives)  Trop hs 28 > 28 CBC wbc 10.7, hgb 12.1, plt 223 BMP na 141, k 3.6, glu 140, cr 1.57  ____________________________________________   EKG  I, Nance Pear, attending physician, personally viewed and interpreted this EKG  EKG Time: 1259 Rate: 66 Rhythm: normal sinus rhythm Axis: normal Intervals: qtc 436 QRS: narrow ST changes: no st elevation, t wave inversion V3-V6 Impression: abnormal ekg  ____________________________________________    RADIOLOGY  CXR Findings suggestive of acute bronchitis  ____________________________________________   PROCEDURES  Procedures  ____________________________________________   INITIAL IMPRESSION / ASSESSMENT AND PLAN / ED COURSE  Pertinent labs & imaging results that were available during my care of the patient were reviewed by me and considered in my medical decision making (see chart for details).   Patient presented to  the  emergency department today because of concern for chest pain. Patient pain free at the time of my exam.  Differential would be broad including ACS, GERD, pneumonia, pneumothorax, costochondritis amongst other etiologies.  Patient's work-up had a slightly elevated initial troponin although repeat was the same.  Per review patient had elevated troponins few months ago when he was previously seen.  Given lack of increase of his troponin I doubt ACS.  Chest x-ray showed possible bronchitis although patient without any sustained shortness of breath or cough.  At this time patient des not clinically appear to have bronchitis.  Will plan on discharging patient back to living facility.  Will have patient follow-up with cardiology.  ____________________________________________   FINAL CLINICAL IMPRESSION(S) / ED DIAGNOSES  Final diagnoses:  Chest pain     Note: This dictation was prepared with Dragon dictation. Any transcriptional errors that result from this process are unintentional     Nance Pear, MD 03/08/20 [phone]

## 2020-03-08 NOTE — ED Notes (Signed)
Pt denies any pain at this time. Pt states all symptoms hae resolved.

## 2020-03-08 NOTE — ED Triage Notes (Signed)
Pt comes into the ED via EMS from [facility] with c/o chest and abd pain with diarrhea that started today, nitro x1, ASA 324mg  given by EMS, last b/p 106/62, 554ml NS bolus initiated by EMS, #[id number].Marland Kitchen

## 2020-03-08 NOTE — ED Notes (Signed)
Pt signed a hardcopy of the E-consent form due to signature pad malfunction and it was placed for pick up by medical records

## 2020-03-09 ENCOUNTER — Other Ambulatory Visit: Payer: Self-pay

## 2020-03-09 ENCOUNTER — Inpatient Hospital Stay
Admission: EM | Admit: 2020-03-09 | Discharge: 2020-03-11 | DRG: 303 | Disposition: A | Discharge status: Home / Self Care | Payer: Medicare Other | Attending: Internal Medicine | Admitting: Internal Medicine

## 2020-03-09 ENCOUNTER — Emergency Department: Payer: Medicare Other

## 2020-03-09 DIAGNOSIS — R0789 Other chest pain: Secondary | ICD-10-CM

## 2020-03-09 DIAGNOSIS — Z8701 Personal history of pneumonia (recurrent): Secondary | ICD-10-CM

## 2020-03-09 DIAGNOSIS — I700 Atherosclerosis of aorta: Secondary | ICD-10-CM | POA: Diagnosis present

## 2020-03-09 DIAGNOSIS — I209 Angina pectoris, unspecified: Secondary | ICD-10-CM | POA: Diagnosis present

## 2020-03-09 DIAGNOSIS — J9811 Atelectasis: Secondary | ICD-10-CM | POA: Diagnosis present

## 2020-03-09 DIAGNOSIS — K449 Diaphragmatic hernia without obstruction or gangrene: Secondary | ICD-10-CM | POA: Diagnosis present

## 2020-03-09 DIAGNOSIS — I350 Nonrheumatic aortic (valve) stenosis: Secondary | ICD-10-CM | POA: Diagnosis present

## 2020-03-09 DIAGNOSIS — F17210 Nicotine dependence, cigarettes, uncomplicated: Secondary | ICD-10-CM | POA: Diagnosis present

## 2020-03-09 DIAGNOSIS — J449 Chronic obstructive pulmonary disease, unspecified: Secondary | ICD-10-CM | POA: Diagnosis present

## 2020-03-09 DIAGNOSIS — E0590 Thyrotoxicosis, unspecified without thyrotoxic crisis or storm: Secondary | ICD-10-CM | POA: Diagnosis present

## 2020-03-09 DIAGNOSIS — K222 Esophageal obstruction: Secondary | ICD-10-CM

## 2020-03-09 DIAGNOSIS — I480 Paroxysmal atrial fibrillation: Secondary | ICD-10-CM | POA: Diagnosis present

## 2020-03-09 DIAGNOSIS — R079 Chest pain, unspecified: Secondary | ICD-10-CM

## 2020-03-09 DIAGNOSIS — Z7951 Long term (current) use of inhaled steroids: Secondary | ICD-10-CM

## 2020-03-09 DIAGNOSIS — Z7901 Long term (current) use of anticoagulants: Secondary | ICD-10-CM

## 2020-03-09 DIAGNOSIS — I255 Ischemic cardiomyopathy: Secondary | ICD-10-CM | POA: Diagnosis present

## 2020-03-09 DIAGNOSIS — E1151 Type 2 diabetes mellitus with diabetic peripheral angiopathy without gangrene: Secondary | ICD-10-CM | POA: Diagnosis present

## 2020-03-09 DIAGNOSIS — Z7984 Long term (current) use of oral hypoglycemic drugs: Secondary | ICD-10-CM

## 2020-03-09 DIAGNOSIS — K219 Gastro-esophageal reflux disease without esophagitis: Secondary | ICD-10-CM | POA: Diagnosis present

## 2020-03-09 DIAGNOSIS — I10 Essential (primary) hypertension: Secondary | ICD-10-CM

## 2020-03-09 DIAGNOSIS — Z20822 Contact with and (suspected) exposure to covid-19: Secondary | ICD-10-CM | POA: Diagnosis present

## 2020-03-09 DIAGNOSIS — Z951 Presence of aortocoronary bypass graft: Secondary | ICD-10-CM

## 2020-03-09 DIAGNOSIS — Z7982 Long term (current) use of aspirin: Secondary | ICD-10-CM

## 2020-03-09 DIAGNOSIS — I25118 Atherosclerotic heart disease of native coronary artery with other forms of angina pectoris: Principal | ICD-10-CM | POA: Diagnosis present

## 2020-03-09 DIAGNOSIS — T383X5A Adverse effect of insulin and oral hypoglycemic [antidiabetic] drugs, initial encounter: Secondary | ICD-10-CM | POA: Diagnosis present

## 2020-03-09 DIAGNOSIS — I482 Chronic atrial fibrillation, unspecified: Secondary | ICD-10-CM

## 2020-03-09 DIAGNOSIS — I130 Hypertensive heart and chronic kidney disease with heart failure and stage 1 through stage 4 chronic kidney disease, or unspecified chronic kidney disease: Secondary | ICD-10-CM | POA: Diagnosis present

## 2020-03-09 DIAGNOSIS — I5032 Chronic diastolic (congestive) heart failure: Secondary | ICD-10-CM | POA: Diagnosis present

## 2020-03-09 DIAGNOSIS — E785 Hyperlipidemia, unspecified: Secondary | ICD-10-CM | POA: Diagnosis present

## 2020-03-09 DIAGNOSIS — Z89612 Acquired absence of left leg above knee: Secondary | ICD-10-CM

## 2020-03-09 DIAGNOSIS — E872 Acidosis: Secondary | ICD-10-CM | POA: Diagnosis present

## 2020-03-09 DIAGNOSIS — E876 Hypokalemia: Secondary | ICD-10-CM | POA: Diagnosis present

## 2020-03-09 DIAGNOSIS — F329 Major depressive disorder, single episode, unspecified: Secondary | ICD-10-CM | POA: Diagnosis present

## 2020-03-09 DIAGNOSIS — N1831 Chronic kidney disease, stage 3a: Secondary | ICD-10-CM | POA: Diagnosis present

## 2020-03-09 DIAGNOSIS — Z8249 Family history of ischemic heart disease and other diseases of the circulatory system: Secondary | ICD-10-CM

## 2020-03-09 DIAGNOSIS — E1122 Type 2 diabetes mellitus with diabetic chronic kidney disease: Secondary | ICD-10-CM | POA: Diagnosis present

## 2020-03-09 DIAGNOSIS — Z952 Presence of prosthetic heart valve: Secondary | ICD-10-CM

## 2020-03-09 DIAGNOSIS — Z79899 Other long term (current) drug therapy: Secondary | ICD-10-CM

## 2020-03-09 LAB — TROPONIN I (HIGH SENSITIVITY)
Troponin I (High Sensitivity): 20 ng/L — ABNORMAL HIGH (ref <18)
Troponin I (High Sensitivity): 20 ng/L — ABNORMAL HIGH (ref <18)

## 2020-03-09 LAB — BASIC METABOLIC PANEL
Anion gap: 10 (ref 5–15)
BUN: 18 mg/dL (ref 8–23)
CO2: 23 mmol/L (ref 22–32)
Calcium: 8.4 mg/dL — ABNORMAL LOW (ref 8.9–10.3)
Chloride: 108 mmol/L (ref 98–111)
Creatinine, Ser: 1.38 mg/dL — ABNORMAL HIGH (ref 0.61–1.24)
GFR calc Af Amer: 58 mL/min — ABNORMAL LOW (ref >60)
GFR calc non Af Amer: 50 mL/min — ABNORMAL LOW (ref >60)
Glucose, Bld: 151 mg/dL — ABNORMAL HIGH (ref 70–99)
Potassium: 3.8 mmol/L (ref 3.5–5.1)
Sodium: 141 mmol/L (ref 135–145)

## 2020-03-09 LAB — CBC
HCT: 37.5 % — ABNORMAL LOW (ref 39.0–52.0)
Hemoglobin: 12.4 g/dL — ABNORMAL LOW (ref 13.0–17.0)
MCH: 28.1 pg (ref 26.0–34.0)
MCHC: 33.1 g/dL (ref 30.0–36.0)
MCV: 85 fL (ref 80.0–100.0)
Platelets: 232 10*3/uL (ref 150–400)
RBC: 4.41 MIL/uL (ref 4.22–5.81)
RDW: 14.2 % (ref 11.5–15.5)
WBC: 10.6 10*3/uL — ABNORMAL HIGH (ref 4.0–10.5)
nRBC: 0 % (ref 0.0–0.2)

## 2020-03-09 LAB — GLUCOSE, CAPILLARY: Glucose-Capillary: 176 mg/dL — ABNORMAL HIGH (ref 70–99)

## 2020-03-09 MED ORDER — ALBUTEROL SULFATE (2.5 MG/3ML) 0.083% IN NEBU: 2.5000 mg | INHALATION_SOLUTION | Freq: Four times a day (QID) | RESPIRATORY_TRACT | Status: DC | PRN

## 2020-03-09 MED ORDER — ASPIRIN 81 MG PO CHEW
324.0000 mg | CHEWABLE_TABLET | Freq: Once | ORAL | Status: AC
  Administered 2020-03-09: 324 mg via ORAL
  Filled 2020-03-09: qty 4

## 2020-03-09 MED ORDER — RIVAROXABAN 15 MG PO TABS
15.0000 mg | ORAL_TABLET | Freq: Every day | ORAL | Status: DC
  Administered 2020-03-09 – 2020-03-11 (×3): 15 mg via ORAL
  Filled 2020-03-09 (×4): qty 1

## 2020-03-09 MED ORDER — ASPIRIN 81 MG PO CHEW: 81.0000 mg | CHEWABLE_TABLET | Freq: Every day | ORAL | Status: DC

## 2020-03-09 MED ORDER — HYDROXYZINE HCL 25 MG PO TABS: 25.0000 mg | ORAL_TABLET | Freq: Four times a day (QID) | ORAL | Status: DC | PRN

## 2020-03-09 MED ORDER — FERROUS SULFATE 325 (65 FE) MG PO TABS
325.0000 mg | ORAL_TABLET | Freq: Every day | ORAL | Status: DC
  Administered 2020-03-10 – 2020-03-11 (×2): 325 mg via ORAL
  Filled 2020-03-09 (×4): qty 1

## 2020-03-09 MED ORDER — NICOTINE 14 MG/24HR TD PT24
14.0000 mg | MEDICATED_PATCH | Freq: Every day | TRANSDERMAL | Status: DC
  Administered 2020-03-09 – 2020-03-11 (×3): 14 mg via TRANSDERMAL
  Filled 2020-03-09 (×3): qty 1

## 2020-03-09 MED ORDER — SENNA 8.6 MG PO TABS
2.0000 | ORAL_TABLET | Freq: Every day | ORAL | Status: DC
  Administered 2020-03-09: 17.2 mg via ORAL
  Filled 2020-03-09 (×2): qty 2

## 2020-03-09 MED ORDER — DULOXETINE HCL 30 MG PO CPEP
30.0000 mg | ORAL_CAPSULE | Freq: Two times a day (BID) | ORAL | Status: DC
  Administered 2020-03-10 – 2020-03-11 (×3): 30 mg via ORAL
  Filled 2020-03-09 (×5): qty 1

## 2020-03-09 MED ORDER — MONTELUKAST SODIUM 10 MG PO TABS
10.0000 mg | ORAL_TABLET | Freq: Every day | ORAL | Status: DC
  Administered 2020-03-09 – 2020-03-10 (×2): 10 mg via ORAL
  Filled 2020-03-09 (×2): qty 1

## 2020-03-09 MED ORDER — MOMETASONE FURO-FORMOTEROL FUM 200-5 MCG/ACT IN AERO: 2.0000 | INHALATION_SPRAY | Freq: Two times a day (BID) | RESPIRATORY_TRACT | Status: DC

## 2020-03-09 MED ORDER — PANTOPRAZOLE SODIUM 40 MG PO TBEC
40.0000 mg | DELAYED_RELEASE_TABLET | Freq: Every day | ORAL | Status: DC
  Administered 2020-03-09 – 2020-03-11 (×3): 40 mg via ORAL
  Filled 2020-03-09 (×3): qty 1

## 2020-03-09 MED ORDER — ISOSORBIDE MONONITRATE ER 30 MG PO TB24
30.0000 mg | ORAL_TABLET | Freq: Every day | ORAL | Status: DC
  Administered 2020-03-09 – 2020-03-11 (×3): 30 mg via ORAL
  Filled 2020-03-09 (×3): qty 1

## 2020-03-09 MED ORDER — KETOTIFEN FUMARATE 0.025 % OP SOLN
1.0000 [drp] | Freq: Two times a day (BID) | OPHTHALMIC | Status: DC
  Administered 2020-03-10 – 2020-03-11 (×3): 1 [drp] via OPHTHALMIC
  Filled 2020-03-09: qty 5

## 2020-03-09 MED ORDER — TRAZODONE HCL 50 MG PO TABS: 25.0000 mg | ORAL_TABLET | Freq: Every evening | ORAL | Status: DC | PRN

## 2020-03-09 MED ORDER — TAMSULOSIN HCL 0.4 MG PO CAPS
0.4000 mg | ORAL_CAPSULE | Freq: Every day | ORAL | Status: DC
  Administered 2020-03-09 – 2020-03-11 (×3): 0.4 mg via ORAL
  Filled 2020-03-09 (×3): qty 1

## 2020-03-09 MED ORDER — SODIUM CHLORIDE 0.9% FLUSH
3.0000 mL | Freq: Once | INTRAVENOUS | Status: AC
  Administered 2020-03-09: 3 mL via INTRAVENOUS

## 2020-03-09 MED ORDER — INSULIN ASPART 100 UNIT/ML ~~LOC~~ SOLN
0.0000 [IU] | Freq: Three times a day (TID) | SUBCUTANEOUS | Status: DC
  Administered 2020-03-10: 3 [IU] via SUBCUTANEOUS
  Administered 2020-03-10: 2 [IU] via SUBCUTANEOUS
  Administered 2020-03-10: 3 [IU] via SUBCUTANEOUS
  Administered 2020-03-11: 2 [IU] via SUBCUTANEOUS
  Administered 2020-03-11: 1 [IU] via SUBCUTANEOUS
  Filled 2020-03-09 (×5): qty 1

## 2020-03-09 MED ORDER — MELATONIN 5 MG PO TABS
5.0000 mg | ORAL_TABLET | Freq: Every day | ORAL | Status: DC
  Administered 2020-03-09 – 2020-03-10 (×2): 5 mg via ORAL
  Filled 2020-03-09 (×3): qty 1

## 2020-03-09 MED ORDER — ATORVASTATIN CALCIUM 80 MG PO TABS
80.0000 mg | ORAL_TABLET | Freq: Every day | ORAL | Status: DC
  Administered 2020-03-09 – 2020-03-10 (×2): 80 mg via ORAL
  Filled 2020-03-09: qty 4
  Filled 2020-03-09: qty 1

## 2020-03-09 MED ORDER — LACTULOSE 10 GM/15ML PO SOLN: 30.0000 g | Freq: Two times a day (BID) | ORAL | Status: DC | PRN

## 2020-03-09 MED ORDER — NITROGLYCERIN 0.4 MG SL SUBL
0.4000 mg | SUBLINGUAL_TABLET | SUBLINGUAL | Status: DC | PRN
  Filled 2020-03-09: qty 1

## 2020-03-09 MED ORDER — LAMOTRIGINE 25 MG PO TABS
25.0000 mg | ORAL_TABLET | Freq: Two times a day (BID) | ORAL | Status: DC
  Administered 2020-03-09 – 2020-03-11 (×4): 25 mg via ORAL
  Filled 2020-03-09 (×6): qty 1

## 2020-03-09 MED ORDER — LORAZEPAM 0.5 MG PO TABS
0.5000 mg | ORAL_TABLET | Freq: Two times a day (BID) | ORAL | Status: DC | PRN
  Administered 2020-03-10: 0.5 mg via ORAL
  Filled 2020-03-09: qty 1

## 2020-03-09 MED ORDER — FUROSEMIDE 40 MG PO TABS
80.0000 mg | ORAL_TABLET | Freq: Two times a day (BID) | ORAL | Status: DC
  Administered 2020-03-09 – 2020-03-11 (×4): 80 mg via ORAL
  Filled 2020-03-09 (×4): qty 2

## 2020-03-09 NOTE — ED Notes (Signed)
Pt called for reassessment of VS and lab collection, no response

## 2020-03-09 NOTE — ED Provider Notes (Signed)
[facility] Emergency Department Provider Note   ____________________________________________   First MD Initiated Contact with Patient 03/09/20 1642     (approximate)  I have reviewed the triage vital signs and the nursing notes.   HISTORY  Chief Complaint Chest Pain    HPI Shawn Jennings is a 74 y.o. male with possible history of CAD status post CABG, hypertension, diabetes, CKD, COPD, and atrial fibrillation on Xarelto who presents to the ED complaining of chest pain.  Patient reports that he had an episode of chest pressure earlier today while he was sitting in his chair.  It persisted for a couple of hours before resolving, was associated with some difficulty breathing that is also now resolved.  He now states he feels completely back to normal, but was concerned that this episode was the same he dealt with yesterday, when he had an unremarkable work-up here in the ED.  He denies any recent fevers, cough, vomiting, diarrhea, leg swelling or pain.  He is status post left AKA.        Past Medical History:  Diagnosis Date  . CAD (coronary artery disease)    a. 1994 s/p CABG ([place], [place]); b. Reports multiple stress tests over the years.  . CKD (chronic kidney disease), stage III   . COPD (chronic obstructive pulmonary disease) ([place])   . Depression   . Essential hypertension   . GERD (gastroesophageal reflux disease)   . Hyperlipidemia LDL goal <70   . PAD (peripheral artery disease) ([place])    a. 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  Marland Kitchen PAF (paroxysmal atrial fibrillation) (HCC)    a. CHA2DS2VASc = 4-->Xarelto 15mg  daily in setting of CKD.  Marland Kitchen Thyrotoxicosis   . Tobacco abuse   . Type II diabetes mellitus [facility])     Patient Active Problem List   Diagnosis Date Noted  . Chest pain 01/27/2019  . Renal insufficiency   . Nonspecific chest pain 01/26/2019    Past Surgical History:  Procedure Laterality Date  . CORONARY ARTERY BYPASS GRAFT     a. [place], [place]  .  Left AKA      Prior to Admission medications   Medication Sig Start Date End Date Taking? Authorizing Provider  acetaminophen (TYLENOL) 500 MG tablet Take 500 mg by mouth every 8 (eight) hours as needed for mild pain or fever.     [provider]  albuterol (PROVENTIL) (2.5 MG/3ML) 0.083% nebulizer solution Take 2.5 mg by nebulization every 4 (four) hours as needed for wheezing or shortness of breath.    [provider]  albuterol (VENTOLIN HFA) 108 (90 Base) MCG/ACT inhaler Inhale 2 puffs into the lungs every 6 (six) hours as needed for wheezing or shortness of breath.    [provider]  aspirin (ASPIRIN LOW DOSE) 81 MG chewable tablet TAKE 1 TABLET BY MOUTH DAILY    [provider]  aspirin EC 81 MG tablet Take 81 mg by mouth daily.    [provider]  atorvastatin (LIPITOR) 80 MG tablet Take 80 mg by mouth at bedtime.    [provider]  DULoxetine (CYMBALTA) 30 MG capsule Take 30 mg by mouth 2 (two) times daily.    [provider]  ferrous sulfate 325 (65 FE) MG tablet Take 325 mg by mouth daily.    [provider]  Fluticasone-Salmeterol (ADVAIR DISKUS) 250-50 MCG/DOSE AEPB Inhale 2 puffs into the lungs in the morning and at bedtime.  05/08/17  [provider]  Fluticasone-Salmeterol (ADVAIR) 250-50 MCG/DOSE AEPB Inhale 1 puff into the lungs 2 (two) times daily.    [provider]  furosemide (LASIX) 80 MG tablet Take 80 mg by mouth 2 (two) times daily.    [provider]  hydrOXYzine (ATARAX/VISTARIL) 25 MG tablet Take 25 mg by mouth every 6 (six) hours as needed (allergy symptoms).    [provider]  isosorbide mononitrate (IMDUR) 30 MG 24 hr tablet Take 0.5 tablets (15 mg total) by mouth daily for 30 days. Patient taking differently: Take 30 mg by mouth daily.  01/29/19 02/12/20  Saundra Shelling, MD  ketotifen (ZADITOR) 0.025 % ophthalmic solution Place 1 drop into both eyes 2 (two)  times daily as needed.     [provider]  ketotifen (ZADITOR) 0.025 % ophthalmic solution PLACE 1 DROP INTO AFFECTED EYE 2 TIMES PER DAY    [provider]  lactulose (CHRONULAC) 10 GM/15ML solution Take 30 g by mouth every 12 (twelve) hours as needed for mild constipation.    [provider]  lamoTRIgine (LAMICTAL) 25 MG tablet Take 25 mg by mouth 2 (two) times daily.    [provider]  lidocaine (LIDODERM) 5 % Cut 1 patch in half and apply to amputated stump once daily 12 hours on, 12 hours off    [provider]  liver oil-zinc oxide (DESITIN) 40 % ointment Apply 1 application topically at bedtime as needed for irritation. To buttocks     [provider]  loratadine (CLARITIN) 10 MG tablet Take 10 mg by mouth daily as needed for allergies.     [provider]  LORazepam (ATIVAN) 0.5 MG tablet Take 0.5 mg by mouth 2 (two) times daily as needed for anxiety.    [provider]  Melatonin 3 MG TABS Take 1 tablet by mouth at bedtime.    [provider]  metFORMIN (GLUCOPHAGE) 500 MG tablet Take 500 mg by mouth 2 (two) times daily with a meal.     [provider]  montelukast (SINGULAIR) 10 MG tablet Take 10 mg by mouth at bedtime.    [provider]  Multiple Vitamins-Minerals (PRESERVISION AREDS 2) CAPS Take 1 capsule by mouth 2 (two) times daily.    [provider]  nicotine (NICODERM CQ - DOSED IN MG/24 HOURS) 14 mg/24hr patch Place 14 mg onto the skin daily.    [provider]  nitroGLYCERIN (NITROSTAT) 0.4 MG SL tablet Place 0.4 mg under the tongue every 5 (five) minutes as needed for chest pain.    [provider]  Oxycodone HCl 10 MG TABS Take 1 tablet (10 mg total) by mouth every 8 (eight) hours as needed (pain). Patient not taking: Reported on 02/12/2020 01/28/19   Saundra Shelling, MD  pantoprazole (PROTONIX) 40 MG tablet Take 40 mg by mouth daily.    [provider]  potassium chloride (MICRO-K) 10 MEQ CR capsule Take 10 mEq by mouth daily. 12/11/19   [provider]  Rivaroxaban (XARELTO) 15 MG TABS tablet Take 15 mg by mouth daily.    [provider]  senna (SENOKOT) 8.6 MG tablet Take 2 tablets by mouth at bedtime.    [provider]  tamsulosin (FLOMAX) 0.4 MG CAPS capsule Take 0.4 mg by mouth daily.    [provider]  traZODone (DESYREL) 50 MG tablet Take 25 mg by mouth at bedtime as needed for sleep.    [provider]  triamcinolone  cream (KENALOG) 0.1 % Apply 1 application topically 2 (two) times daily.    [provider]    Allergies Patient has no known allergies.  Family History  Problem Relation Age of Onset  . Heart attack Mother        died @ 43.  . Other Father        never knew his father.  . Other Sister        overdose of sleeping pills.  . Heart disease Brother        died in his 38's  . Other Sister        complication of abd surgery @ 95  . CAD Sister     Social History Social History   Tobacco Use  . Smoking status: Current Every Day Smoker    Packs/day: 1.00    Years: 62.00    Pack years: 62.00  . Smokeless tobacco: Never Used  Substance Use Topics  . Alcohol use: Never  . Drug use: Never    Review of Systems  Constitutional: No fever/chills Eyes: No visual changes. ENT: No sore throat. Cardiovascular: Positive for chest pain. Respiratory: Positive for shortness of breath. Gastrointestinal: No abdominal pain.  No nausea, no vomiting.  No diarrhea.  No constipation. Genitourinary: Negative for dysuria. Musculoskeletal: Negative for back pain. Skin: Negative for rash. Neurological: Negative for headaches, focal weakness or numbness.  ____________________________________________   PHYSICAL EXAM:  VITAL SIGNS: ED Triage Vitals  Enc Vitals Group     BP 03/09/20 1233 112/74     Pulse Rate 03/09/20 1233 91     Resp 03/09/20 1233 20      Temp 03/09/20 1233 98.3 F (36.8 C)     Temp Source 03/09/20 1233 Oral     SpO2 03/09/20 1233 95 %     Weight 03/09/20 1234 143 lb (64.9 kg)     Height 03/09/20 1234 6' (1.829 m)     Head Circumference --      Peak Flow --      Pain Score 03/09/20 1233 7     Pain Loc --      Pain Edu? --      Excl. in [place]? --     Constitutional: Alert and oriented. Eyes: Conjunctivae are normal. Head: Atraumatic. Nose: No congestion/rhinnorhea. Mouth/Throat: Mucous membranes are moist. Neck: Normal ROM Cardiovascular: Normal rate, regular rhythm. Grossly normal heart sounds. Respiratory: Normal respiratory effort.  No retractions. Lungs CTAB. Gastrointestinal: Soft and nontender. No distention. Genitourinary: deferred Musculoskeletal: Status post left AKA, no right lower extremity tenderness or edema. Neurologic:  Normal speech and language. No gross focal neurologic deficits are appreciated. Skin:  Skin is warm, dry and intact. No rash noted. Psychiatric: Mood and affect are normal. Speech and behavior are normal.  ____________________________________________   LABS (all labs ordered are listed, but only abnormal results are displayed)  Labs Reviewed  BASIC METABOLIC PANEL - Abnormal; Notable for the following components:      Result Value   Glucose, Bld 151 (*)    Creatinine, Ser 1.38 (*)    Calcium 8.4 (*)    GFR calc non Af Amer 50 (*)    GFR calc Af Amer 58 (*)    All other components within normal limits  CBC - Abnormal; Notable for the following components:   WBC 10.6 (*)    Hemoglobin 12.4 (*)    HCT 37.5 (*)    All other components within normal limits  TROPONIN I (  HIGH SENSITIVITY) - Abnormal; Notable for the following components:   Troponin I (High Sensitivity) 20 (*)    All other components within normal limits  TROPONIN I (HIGH SENSITIVITY) - Abnormal; Notable for the following components:   Troponin I (High Sensitivity) 20 (*)    All other components within  normal limits  SARS CORONAVIRUS 2 (TAT 6-24 HRS)   ____________________________________________  EKG  ED ECG REPORT I, Blake Divine, the attending physician, personally viewed and interpreted this ECG.   Date: 03/09/2020  EKG Time: 12:26  Rate: 93  Rhythm: normal sinus rhythm  Axis: Normal  Intervals:none  ST&T Change: Nonspecific T wave changes    PROCEDURES  Procedure(s) performed (including Critical Care):  Procedures   ____________________________________________   INITIAL IMPRESSION / ASSESSMENT AND PLAN / ED COURSE       74 year old male with history of CAD status post CABG, hypertension, diabetes, CKD, COPD, and atrial fibrillation presents to the ED for chest pressure starting today while at rest.  Pain persisted for a couple hours before resolving on its own he describes the pain as similar to what he was seen for in the ED yesterday.  Work-up is again unremarkable today with EKG showing no ischemic changes and 2 sets of troponin stable.  Chest x-ray does show questionable pneumonia, however given resolution of patient's symptoms and no fevers or cough, this seems unlikely and we will hold off on antibiotics.  Case was discussed with Dr. Harrell Gave of cardiology, who states that given this is his second visit in 2 days for chest pain and his high risk status, admission would be appropriate.  Case was discussed with hospitalist for admission, patient remains pain-free at this time.      ____________________________________________   FINAL CLINICAL IMPRESSION(S) / ED DIAGNOSES  Final diagnoses:  Nonspecific chest pain  S/P CABG (coronary artery bypass graft)     ED Discharge Orders    None       Note:  This document was prepared using Dragon voice recognition software and may include unintentional dictation errors.   Blake Divine, MD 03/09/20 1730

## 2020-03-09 NOTE — H&P (Signed)
Shawn Jennings is an 74 y.o. male.   Chief Complaint: Chest pain HPI:  Patient is a 74 year old male with past medical history of coronary disease, chronic kidney disease stage IIIa, COPD, depression, essential hypertension, dyslipidemia, peripheral vascular disease, paroxysmal atrial fibrillation on Xarelto, type 2 diabetes and continued tobacco abuse who presents to the hospital complaining of chest pain. Patient had a coronary bypass grafting about 8 years ago.  He continued to have recurrent chest pain but infrequent.  Chest pain normally happens once a month, normally last about the whole day.  He had a left above-knee amputation, he has not been very active recently. He had a chest pain started yesterday at 8 AM after he had breakfast.  The pain localized in the lower mid chest/upper stomach.  He described the pain as sharp, but persistent, 7/10 in severity.  The pain seemed to be better after he swallowed some water.  He came to the emergency room yesterday, was sent to home.  The pain resolved last night before sleep. Chest pain started again today, same quality.  But associated with some nausea, some shortness of breath which was not present yesterday.  The chest pain also seem to be worse when he was taking deep breath.  He also has some loose stools for the last 2 days, but denies any nausea vomiting or abdominal pain. Upon arrival in the emergency room, EKG 5/18 did not show any ST elevation.  Troponin level was 28.  Cardiology is called by emergency room for consult, patient will be seen by them tomorrow.  Past Medical History:  Diagnosis Date  . CAD (coronary artery disease)    a. 1994 s/p CABG ([place], [place]); b. Reports multiple stress tests over the years.  . CKD (chronic kidney disease), stage III   . COPD (chronic obstructive pulmonary disease) ([place])   . Depression   . Essential hypertension   . GERD (gastroesophageal reflux disease)   . Hyperlipidemia LDL goal <70   . PAD  (peripheral artery disease) ([place])    a. 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  Marland Kitchen PAF (paroxysmal atrial fibrillation) ([facility])    a. CHA2DS2VASc = 4-->Xarelto 15mg  daily in setting of CKD.  Marland Kitchen Thyrotoxicosis   . Tobacco abuse   . Type II diabetes mellitus ([place])     Past Surgical History:  Procedure Laterality Date  . CORONARY ARTERY BYPASS GRAFT     a. [place], [place]  . Left AKA      Family History  Problem Relation Age of Onset  . Heart attack Mother        died @ 20.  . Other Father        never knew his father.  . Other Sister        overdose of sleeping pills.  . Heart disease Brother        died in his 68's  . Other Sister        complication of abd surgery @ 41  . CAD Sister    Social History:  reports that he has been smoking. He has a 62.00 pack-year smoking history. He has never used smokeless tobacco. He reports that he does not drink alcohol or use drugs.  Allergies: No Known Allergies  (Not in a hospital admission)   Results for orders placed or performed during the hospital encounter of 03/09/20 (from the past 48 hour(s))  Basic metabolic panel     Status: Abnormal   Collection Time: 03/09/20 12:44 PM  Result Value Ref Range   Sodium 141 135 - 145 mmol/L   Potassium 3.8 3.5 - 5.1 mmol/L   Chloride 108 98 - 111 mmol/L   CO2 23 22 - 32 mmol/L   Glucose, Bld 151 (H) 70 - 99 mg/dL    Comment: Glucose reference range applies only to samples taken after fasting for at least 8 hours.   BUN 18 8 - 23 mg/dL   Creatinine, Ser 1.38 (H) 0.61 - 1.24 mg/dL   Calcium 8.4 (L) 8.9 - 10.3 mg/dL   GFR calc non Af Amer 50 (L) >60 mL/min   GFR calc Af Amer 58 (L) >60 mL/min   Anion gap 10 5 - 15    Comment: Performed at [facility], [address] [place], [place] [address]  CBC     Status: Abnormal   Collection Time: 03/09/20 12:44 PM  Result Value Ref Range   WBC 10.6 (H) 4.0 - 10.5 K/uL   RBC 4.41 4.22 - 5.81 MIL/uL   Hemoglobin 12.4 (L) 13.0 - 17.0 g/dL   HCT 37.5 (L)  39.0 - 52.0 %   MCV 85.0 80.0 - 100.0 fL   MCH 28.1 26.0 - 34.0 pg   MCHC 33.1 30.0 - 36.0 g/dL   RDW 14.2 11.5 - 15.5 %   Platelets 232 150 - 400 K/uL   nRBC 0.0 0.0 - 0.2 %    Comment: Performed at [facility], [place], [place] [address]  Troponin I (High Sensitivity)     Status: Abnormal   Collection Time: 03/09/20 12:44 PM  Result Value Ref Range   Troponin I (High Sensitivity) 20 (H) <18 ng/L    Comment: (NOTE) Elevated high sensitivity troponin I (hsTnI) values and significant  changes across serial measurements may suggest ACS but many other  chronic and acute conditions are known to elevate hsTnI results.  Refer to the "Links" section for chest pain algorithms and additional  guidance. Performed at [facility], [place], [place] [address]   Troponin I (High Sensitivity)     Status: Abnormal   Collection Time: 03/09/20  2:48 PM  Result Value Ref Range   Troponin I (High Sensitivity) 20 (H) <18 ng/L    Comment: (NOTE) Elevated high sensitivity troponin I (hsTnI) values and significant  changes across serial measurements may suggest ACS but many other  chronic and acute conditions are known to elevate hsTnI results.  Refer to the "Links" section for chest pain algorithms and additional  guidance. Performed at [facility], [address] [place],  [address]    DG Chest 1 View  Result Date: 03/08/2020 CLINICAL DATA:  Midsternal chest pain. Abdominal pain. Diarrhea today. EXAM: CHEST  1 VIEW COMPARISON:  Radiograph 12/11/2019 FINDINGS: Post median sternotomy and CABG with aortic root replacement. Normal heart size with unchanged mediastinal contours. Aortic atherosclerosis. Central peribronchial thickening. Streaky right lung base atelectasis. No pleural effusion or pneumothorax. Remote left clavicle fracture. IMPRESSION: Central peribronchial thickening, increased from prior exam suggesting acute  bronchitis. Streaky right lung base atelectasis. Aortic Atherosclerosis (ICD10-I70.0). Electronically Signed   By: Keith Rake M.D.   On: 03/08/2020 14:30   DG Chest 2 View  Result Date: 03/09/2020 CLINICAL DATA:  Chest pain beginning yesterday. EXAM: CHEST - 2 VIEW COMPARISON:  03/08/2020 FINDINGS: Previous median sternotomy and CABG. Previous TAVR. Heart size is normal. Aortic atherosclerosis and tortuosity. Mild patchy density at the lung bases right worse than  left suggest mild bronchopneumonia. No dense consolidation or lobar collapse. No effusion. No significant bone finding. IMPRESSION: Newly seen patchy densities in both lower lobes right more than left consistent with mild basilar pneumonia. Electronically Signed   By: Nelson Chimes M.D.   On: 03/09/2020 13:17    Review of Systems  Constitutional: Positive for diaphoresis. Negative for activity change, fatigue, fever and unexpected weight change.  HENT: Negative for mouth sores, nosebleeds, postnasal drip and rhinorrhea.   Eyes: Positive for itching. Negative for discharge.  Respiratory: Positive for shortness of breath. Negative for cough and wheezing.   Cardiovascular: Positive for chest pain. Negative for palpitations and leg swelling.  Gastrointestinal: Positive for diarrhea. Negative for abdominal pain, constipation, nausea and vomiting.  Endocrine: Negative for cold intolerance and heat intolerance.  Genitourinary: Negative for dysuria, frequency and hematuria.  Musculoskeletal: Negative for back pain and joint swelling.  Neurological: Positive for headaches. Negative for dizziness, syncope, speech difficulty and light-headedness.  Psychiatric/Behavioral: Negative for agitation and confusion.    Blood pressure 130/67, pulse 76, temperature 98.3 F (36.8 C), temperature source Oral, resp. rate 19, height 6' (1.829 m), weight 64.9 kg, SpO2 95 %. Physical Exam  Constitutional: He is oriented to person, place, and time. He  appears well-developed and well-nourished. No distress.  HENT:  Head: Normocephalic and atraumatic.  Mouth/Throat: No oropharyngeal exudate.  Eyes: Pupils are equal, round, and reactive to light. Conjunctivae are normal. No scleral icterus.  Neck: No tracheal deviation present. No thyromegaly present.  Cardiovascular: Normal rate. Exam reveals no gallop and no friction rub.  No murmur heard. Irregular  Respiratory: Effort normal and breath sounds normal. No respiratory distress. He has no wheezes. He has no rales.  GI: Soft. Bowel sounds are normal. He exhibits no distension. There is no abdominal tenderness. There is no rebound.  Musculoskeletal:        General: No tenderness or edema. Normal range of motion.     Cervical back: Normal range of motion and neck supple.     Comments: Left AKA  Lymphadenopathy:    He has no cervical adenopathy.  Neurological: He is alert and oriented to person, place, and time. He has normal reflexes. No cranial nerve deficit.  Skin: Skin is warm and dry. He is not diaphoretic.  Psychiatric: He has a normal mood and affect. His behavior is normal.     Assessment/Plan #1.  Chest pain. Patient has history of coronary disease, he has a chronic chest pain happens every month.  Chest pain seems to be worse when he take a deep breath, however, patient was taking chronic anticoagulation for atrial fibrillation, PE is highly unlikely.  Patient will be seen by cardiology tomorrow, will consider stress test versus coronary angiogram.  Currently, patient only has minimal troponin elevation.  #2.  Atrial fibrillation. Heart rate under control.  Continue anticoagulation.  3.  Type 2 diabetes. Follow glucose, continue some sliding scale insulin.  4.  COPD with chronic smoking. No evidence exacerbation.  Advised to quit smoking.  5.  Essential hypertension. Continue home medicines.  6.  Chronic kidney disease stage IIIa.  Follow.  Sharen Hones, MD 03/09/2020,  5:53 PM

## 2020-03-09 NOTE — ED Triage Notes (Signed)
Pt arrives via [facility] from [facility] for c/o chest pain that started yesterday. EMS gave 1 nitro tablet in route. Pt states CP is in central chest accompanied with shob, dizziness. Denies nausea. Pt does not appear to be in any distress at this time.

## 2020-03-10 ENCOUNTER — Other Ambulatory Visit: Payer: Self-pay

## 2020-03-10 ENCOUNTER — Encounter: Payer: Self-pay | Admitting: Internal Medicine

## 2020-03-10 DIAGNOSIS — Z951 Presence of aortocoronary bypass graft: Secondary | ICD-10-CM | POA: Diagnosis not present

## 2020-03-10 DIAGNOSIS — E0590 Thyrotoxicosis, unspecified without thyrotoxic crisis or storm: Secondary | ICD-10-CM | POA: Diagnosis present

## 2020-03-10 DIAGNOSIS — E785 Hyperlipidemia, unspecified: Secondary | ICD-10-CM | POA: Diagnosis present

## 2020-03-10 DIAGNOSIS — Z20822 Contact with and (suspected) exposure to covid-19: Secondary | ICD-10-CM | POA: Diagnosis present

## 2020-03-10 DIAGNOSIS — R079 Chest pain, unspecified: Secondary | ICD-10-CM | POA: Diagnosis not present

## 2020-03-10 DIAGNOSIS — I420 Dilated cardiomyopathy: Secondary | ICD-10-CM

## 2020-03-10 DIAGNOSIS — E872 Acidosis: Secondary | ICD-10-CM | POA: Diagnosis present

## 2020-03-10 DIAGNOSIS — E1122 Type 2 diabetes mellitus with diabetic chronic kidney disease: Secondary | ICD-10-CM | POA: Diagnosis present

## 2020-03-10 DIAGNOSIS — K219 Gastro-esophageal reflux disease without esophagitis: Secondary | ICD-10-CM | POA: Diagnosis present

## 2020-03-10 DIAGNOSIS — E1151 Type 2 diabetes mellitus with diabetic peripheral angiopathy without gangrene: Secondary | ICD-10-CM | POA: Diagnosis present

## 2020-03-10 DIAGNOSIS — K222 Esophageal obstruction: Secondary | ICD-10-CM | POA: Diagnosis not present

## 2020-03-10 DIAGNOSIS — I25118 Atherosclerotic heart disease of native coronary artery with other forms of angina pectoris: Secondary | ICD-10-CM | POA: Diagnosis present

## 2020-03-10 DIAGNOSIS — N1831 Chronic kidney disease, stage 3a: Secondary | ICD-10-CM | POA: Diagnosis present

## 2020-03-10 DIAGNOSIS — I10 Essential (primary) hypertension: Secondary | ICD-10-CM

## 2020-03-10 DIAGNOSIS — T383X5A Adverse effect of insulin and oral hypoglycemic [antidiabetic] drugs, initial encounter: Secondary | ICD-10-CM | POA: Diagnosis present

## 2020-03-10 DIAGNOSIS — I480 Paroxysmal atrial fibrillation: Secondary | ICD-10-CM | POA: Diagnosis present

## 2020-03-10 DIAGNOSIS — K449 Diaphragmatic hernia without obstruction or gangrene: Secondary | ICD-10-CM | POA: Diagnosis present

## 2020-03-10 DIAGNOSIS — I5032 Chronic diastolic (congestive) heart failure: Secondary | ICD-10-CM | POA: Diagnosis present

## 2020-03-10 DIAGNOSIS — J189 Pneumonia, unspecified organism: Secondary | ICD-10-CM

## 2020-03-10 DIAGNOSIS — Z7901 Long term (current) use of anticoagulants: Secondary | ICD-10-CM | POA: Diagnosis not present

## 2020-03-10 DIAGNOSIS — E876 Hypokalemia: Secondary | ICD-10-CM | POA: Diagnosis present

## 2020-03-10 DIAGNOSIS — Z79899 Other long term (current) drug therapy: Secondary | ICD-10-CM | POA: Diagnosis not present

## 2020-03-10 DIAGNOSIS — I209 Angina pectoris, unspecified: Secondary | ICD-10-CM | POA: Diagnosis present

## 2020-03-10 DIAGNOSIS — J9811 Atelectasis: Secondary | ICD-10-CM | POA: Diagnosis present

## 2020-03-10 DIAGNOSIS — Z7982 Long term (current) use of aspirin: Secondary | ICD-10-CM | POA: Diagnosis not present

## 2020-03-10 DIAGNOSIS — I130 Hypertensive heart and chronic kidney disease with heart failure and stage 1 through stage 4 chronic kidney disease, or unspecified chronic kidney disease: Secondary | ICD-10-CM | POA: Diagnosis present

## 2020-03-10 DIAGNOSIS — Z952 Presence of prosthetic heart valve: Secondary | ICD-10-CM | POA: Diagnosis not present

## 2020-03-10 DIAGNOSIS — Z89612 Acquired absence of left leg above knee: Secondary | ICD-10-CM | POA: Diagnosis not present

## 2020-03-10 DIAGNOSIS — F17210 Nicotine dependence, cigarettes, uncomplicated: Secondary | ICD-10-CM | POA: Diagnosis present

## 2020-03-10 DIAGNOSIS — R0789 Other chest pain: Secondary | ICD-10-CM | POA: Diagnosis present

## 2020-03-10 DIAGNOSIS — F329 Major depressive disorder, single episode, unspecified: Secondary | ICD-10-CM | POA: Diagnosis present

## 2020-03-10 LAB — BASIC METABOLIC PANEL
Anion gap: 11 (ref 5–15)
BUN: 17 mg/dL (ref 8–23)
CO2: 24 mmol/L (ref 22–32)
Calcium: 8.5 mg/dL — ABNORMAL LOW (ref 8.9–10.3)
Chloride: 107 mmol/L (ref 98–111)
Creatinine, Ser: 1.37 mg/dL — ABNORMAL HIGH (ref 0.61–1.24)
GFR calc Af Amer: 58 mL/min — ABNORMAL LOW (ref >60)
GFR calc non Af Amer: 50 mL/min — ABNORMAL LOW (ref >60)
Glucose, Bld: 169 mg/dL — ABNORMAL HIGH (ref 70–99)
Potassium: 3.4 mmol/L — ABNORMAL LOW (ref 3.5–5.1)
Sodium: 142 mmol/L (ref 135–145)

## 2020-03-10 LAB — GLUCOSE, CAPILLARY
Glucose-Capillary: 162 mg/dL — ABNORMAL HIGH (ref 70–99)
Glucose-Capillary: 210 mg/dL — ABNORMAL HIGH (ref 70–99)
Glucose-Capillary: 238 mg/dL — ABNORMAL HIGH (ref 70–99)
Glucose-Capillary: 164 mg/dL — ABNORMAL HIGH (ref 70–99)

## 2020-03-10 LAB — LACTIC ACID, PLASMA
Lactic Acid, Venous: 2.2 mmol/L (ref 0.5–1.9)
Lactic Acid, Venous: 2.6 mmol/L (ref 0.5–1.9)

## 2020-03-10 LAB — CBC WITH DIFFERENTIAL/PLATELET
Abs Immature Granulocytes: 0.06 10*3/uL (ref 0.00–0.07)
Basophils Absolute: 0.1 10*3/uL (ref 0.0–0.1)
Basophils Relative: 1 %
Eosinophils Absolute: 0.6 10*3/uL — ABNORMAL HIGH (ref 0.0–0.5)
Eosinophils Relative: 5 %
HCT: 36.5 % — ABNORMAL LOW (ref 39.0–52.0)
Hemoglobin: 11.9 g/dL — ABNORMAL LOW (ref 13.0–17.0)
Immature Granulocytes: 1 %
Lymphocytes Relative: 18 %
Lymphs Abs: 1.9 10*3/uL (ref 0.7–4.0)
MCH: 27.5 pg (ref 26.0–34.0)
MCHC: 32.6 g/dL (ref 30.0–36.0)
MCV: 84.3 fL (ref 80.0–100.0)
Monocytes Absolute: 0.7 10*3/uL (ref 0.1–1.0)
Monocytes Relative: 7 %
Neutro Abs: 7.6 10*3/uL (ref 1.7–7.7)
Neutrophils Relative %: 68 %
Platelets: 210 10*3/uL (ref 150–400)
RBC: 4.33 MIL/uL (ref 4.22–5.81)
RDW: 14.1 % (ref 11.5–15.5)
WBC: 11.1 10*3/uL — ABNORMAL HIGH (ref 4.0–10.5)
nRBC: 0 % (ref 0.0–0.2)

## 2020-03-10 LAB — SARS CORONAVIRUS 2 (TAT 6-24 HRS): SARS Coronavirus 2: NEGATIVE

## 2020-03-10 LAB — MAGNESIUM: Magnesium: 1.5 mg/dL — ABNORMAL LOW (ref 1.7–2.4)

## 2020-03-10 LAB — PROCALCITONIN: Procalcitonin: <0.1 ng/mL

## 2020-03-10 LAB — STREP PNEUMONIAE URINARY ANTIGEN: Strep Pneumo Urinary Antigen: NEGATIVE

## 2020-03-10 LAB — MRSA PCR SCREENING: MRSA by PCR: POSITIVE — AB

## 2020-03-10 MED ORDER — SODIUM CHLORIDE 0.9 % IV SOLN
2.0000 g | Freq: Two times a day (BID) | INTRAVENOUS | Status: DC
  Administered 2020-03-10: 2 g via INTRAVENOUS
  Filled 2020-03-10 (×2): qty 2

## 2020-03-10 MED ORDER — VANCOMYCIN HCL 1250 MG/250ML IV SOLN: 1250.0000 mg | INTRAVENOUS | Status: DC

## 2020-03-10 MED ORDER — CHLORHEXIDINE GLUCONATE CLOTH 2 % EX PADS
6.0000 | MEDICATED_PAD | Freq: Every day | CUTANEOUS | Status: DC
  Administered 2020-03-10: 6 via TOPICAL

## 2020-03-10 MED ORDER — VANCOMYCIN HCL IN DEXTROSE 1-5 GM/200ML-% IV SOLN: 1000.0000 mg | INTRAVENOUS | Status: DC

## 2020-03-10 MED ORDER — SODIUM CHLORIDE 0.9 % IV SOLN
INTRAVENOUS | Status: DC | PRN
  Administered 2020-03-10: 500 mL via INTRAVENOUS

## 2020-03-10 MED ORDER — DOXYCYCLINE HYCLATE 100 MG PO TABS
100.0000 mg | ORAL_TABLET | Freq: Two times a day (BID) | ORAL | Status: DC
  Administered 2020-03-10 – 2020-03-11 (×3): 100 mg via ORAL
  Filled 2020-03-10 (×3): qty 1

## 2020-03-10 MED ORDER — ACETAMINOPHEN 325 MG PO TABS: 650.0000 mg | ORAL_TABLET | Freq: Four times a day (QID) | ORAL | Status: DC | PRN

## 2020-03-10 MED ORDER — TRAMADOL HCL 50 MG PO TABS
50.0000 mg | ORAL_TABLET | Freq: Three times a day (TID) | ORAL | Status: DC | PRN
  Administered 2020-03-10 – 2020-03-11 (×3): 50 mg via ORAL
  Filled 2020-03-10 (×4): qty 1

## 2020-03-10 MED ORDER — AMOXICILLIN-POT CLAVULANATE 875-125 MG PO TABS
1.0000 | ORAL_TABLET | Freq: Two times a day (BID) | ORAL | Status: DC
  Administered 2020-03-10 – 2020-03-11 (×3): 1 via ORAL
  Filled 2020-03-10 (×3): qty 1

## 2020-03-10 MED ORDER — VANCOMYCIN HCL 1500 MG/300ML IV SOLN
1500.0000 mg | Freq: Once | INTRAVENOUS | Status: AC
  Administered 2020-03-10: 1500 mg via INTRAVENOUS
  Filled 2020-03-10: qty 300

## 2020-03-10 MED ORDER — MUPIROCIN 2 % EX OINT
1.0000 "application " | TOPICAL_OINTMENT | Freq: Two times a day (BID) | CUTANEOUS | Status: DC
  Administered 2020-03-10 – 2020-03-11 (×3): 1 via NASAL
  Filled 2020-03-10: qty 22

## 2020-03-10 MED ORDER — MAGNESIUM SULFATE 2 GM/50ML IV SOLN
2.0000 g | Freq: Once | INTRAVENOUS | Status: AC
  Administered 2020-03-10: 2 g via INTRAVENOUS
  Filled 2020-03-10: qty 50

## 2020-03-10 MED ORDER — POTASSIUM CHLORIDE 20 MEQ PO PACK
40.0000 meq | PACK | Freq: Two times a day (BID) | ORAL | Status: AC
  Administered 2020-03-10 (×2): 40 meq via ORAL
  Filled 2020-03-10 (×2): qty 2

## 2020-03-10 NOTE — Progress Notes (Signed)
PROGRESS NOTE    Shawn Jennings  WGN:[id number] DOB: [birth year] DOA: 03/09/2020 PCP: Orvis Brill, [facility]  Chief complaint: Chest pain  Brief Narrative: Patient is a 74 year old male with past medical history of coronary disease, chronic kidney disease stage IIIa, COPD, depression, essential hypertension, dyslipidemia, peripheral vascular disease, paroxysmal atrial fibrillation on Xarelto, type 2 diabetes and continued tobacco abuse who presents to the hospital complaining of chest pain. 5/20.  Overnight, patient had a continued pleuritic chest pain.  Chest x-ray showed bilateral lower lobe infiltrates.  Procalcitonin level less than 0.01.  Assessment & Plan:   Active Problems:   Chest pain   Chronic kidney disease, stage 3a   Essential hypertension  #1.  Chest pain. Chest pain still seem to be worse with deep breaths.  He still has some chest pain today.  I personally reviewed the patient chest x-ray images performed yesterday, there is some infiltrates in bilateral lower lobe which was not present the day prior.  However, procalcitonin level less than 0.01.  This is not consistent with bacterial pneumonia.  Patient does has occasional problem with swallowing, will obtain esophagram to rule out esophageal stenosis with aspiration.  He has a positive nasal MRSA culture, I will cover with doxycycline and Augmentin for now.  #2. Atrial fibrillation. Continue anticoagulation.  #3.  Type 2 diabetes. Continue sliding scale insulin.  4.  COPD with chronic smoking. No exacerbation.  5.  Essential hypertension. Continue home medicines.  6.  Chronic kidney disease stage 3a.  Follow.  7.  Hypokalemia. Supplement orally.     DVT prophylaxis: On Xarelto Code Status: Full Family Communication:None Disposition Plan:  . Patient came from:            . Anticipated d/c place: . Barriers to d/c OR conditions which need to be met to effect a safe d/c:   Consultants:    Cardiology  Procedures: None Antimicrobials:  Doxycycline Augmentin  Subjective: Patient still complaining of chest pain, worse with deep breaths.  He rated as a 6-7/10 in severity.  No shortness of breath.  No diaphoresis or nausea vomiting. No abdominal pain.  No diarrhea constipation No dysuria hematuria No fever or chills.  Objective: Vitals:   03/10/20 0047 03/10/20 0416 03/10/20 0559 03/10/20 0746  BP: 122/68 100/66 (!) 116/58 111/74  Pulse: 70 80 71 72  Resp: _0 Temp: 97.8 F (36.6 C) 97.9 F (36.6 C)  97.9 F (36.6 C)  TempSrc: Oral Oral  Oral  SpO2: 96% 95% 92% 94%  Weight:  65.4 kg    Height:        Intake/Output Summary (Last 24 hours) at 03/10/2020 0935 Last data filed at 03/10/2020 0420 Gross per 24 hour  Intake -  Output 400 ml  Net -400 ml   Filed Weights   03/09/20 1234 03/10/20 0416  Weight: 64.9 kg 65.4 kg    Examination:  General exam: Appears calm and comfortable  Respiratory system: Decreased breathing sounds with crackles in bilateral lower lobes. Respiratory effort normal. Cardiovascular system: Irregular, no JVD, murmurs, rubs, gallops or clicks. No pedal edema. Gastrointestinal system: Abdomen is nondistended, soft and nontender. No organomegaly or masses felt. Normal bowel sounds heard. Central nervous system: Alert and oriented. No focal neurological deficits. Extremities: Left AKA Skin: No rashes, lesions or ulcers Psychiatry: Judgement and insight appear normal. Mood & affect appropriate.     Data Reviewed: I have personally reviewed following labs and imaging studies  CBC: Recent Labs  Lab 03/08/20 1308 03/09/20 1244 03/10/20 0617  WBC 10.7* 10.6* 11.1*  NEUTROABS  --   --  7.6  HGB 12.1* 12.4* 11.9*  HCT 37.1* 37.5* 36.5*  MCV 85.3 85.0 84.3  PLT 223 232 122   Basic Metabolic Panel: Recent Labs  Lab 03/08/20 1308 03/09/20 1244 03/10/20 0617  NA 141 141 142  K 3.6 3.8 3.4*  CL 106 108 107  CO2 _0 GLUCOSE 140* 151* 169*  BUN _1 CREATININE 1.57* 1.38* 1.37*  CALCIUM 8.2* 8.4* 8.5*  MG  --   --  1.5*   GFR: Estimated Creatinine Clearance: 43.1 mL/min (A) (by C-G formula based on SCr of 1.37 mg/dL (H)). Liver Function Tests: No results for input(s): AST, ALT, ALKPHOS, BILITOT, PROT, ALBUMIN in the last 168 hours. No results for input(s): LIPASE, AMYLASE in the last 168 hours. No results for input(s): AMMONIA in the last 168 hours. Coagulation Profile: Recent Labs  Lab 03/08/20 1308  INR 1.1   Cardiac Enzymes: No results for input(s): CKTOTAL, CKMB, CKMBINDEX, TROPONINI in the last 168 hours. BNP (last 3 results) No results for input(s): PROBNP in the last 8760 hours. HbA1C: No results for input(s): HGBA1C in the last 72 hours. CBG: Recent Labs  Lab 03/09/20 2356 03/10/20 0747  GLUCAP 176* 162*   Lipid Profile: No results for input(s): CHOL, HDL, LDLCALC, TRIG, CHOLHDL, LDLDIRECT in the last 72 hours. Thyroid Function Tests: No results for input(s): TSH, T4TOTAL, FREET4, T3FREE, THYROIDAB in the last 72 hours. Anemia Panel: No results for input(s): VITAMINB12, FOLATE, FERRITIN, TIBC, IRON, RETICCTPCT in the last 72 hours. Sepsis Labs: Recent Labs  Lab 03/10/20 0617  PROCALCITON <0.10  LATICACIDVEN 2.2*    Recent Results (from the past 240 hour(s))  SARS CORONAVIRUS 2 (TAT 6-24 HRS) Nasopharyngeal Nasopharyngeal Swab     Status: None   Collection Time: 03/09/20  5:43 PM   Specimen: Nasopharyngeal Swab  Result Value Ref Range Status   SARS Coronavirus 2 NEGATIVE NEGATIVE Final    Comment: (NOTE) SARS-CoV-2 target nucleic acids are NOT DETECTED. The SARS-CoV-2 RNA is generally detectable in upper and lower respiratory specimens during the acute phase of infection. Negative results do not preclude SARS-CoV-2 infection, do not rule out co-infections with other pathogens, and should not be used as the sole basis for treatment or other patient management  decisions. Negative results must be combined with clinical observations, patient history, and epidemiological information. The expected result is Negative. Fact Sheet for Patients: SugarRoll.be Fact Sheet for Healthcare Providers: https://www.woods-mathews.com/ This test is not yet approved or cleared by the [place] FDA and  has been authorized for detection and/or diagnosis of SARS-CoV-2 by FDA under an Emergency Use Authorization (EUA). This EUA will remain  in effect (meaning this test can be used) for the duration of the COVID-19 declaration under Section 56 4(b)(1) of the Act, 21 U.S.C. section 360bbb-3(b)(1), unless the authorization is terminated or revoked sooner. Performed at [facility], [place] [address] [place], [place] [address]   MRSA PCR Screening     Status: Abnormal   Collection Time: 03/10/20  6:10 AM   Specimen: Nasopharyngeal  Result Value Ref Range Status   MRSA by PCR POSITIVE (A) NEGATIVE Final    Comment:        The GeneXpert MRSA Assay (FDA approved for NASAL specimens only), is one component of a comprehensive MRSA colonization surveillance program. It is not intended to diagnose  MRSA infection nor to guide or monitor treatment for MRSA infections. RESULT CALLED TO, READ BACK BY AND VERIFIED WITH:  JESSICA CHRISTMAS _0  ON 03/10/20 SKL Performed at [facility], [address] [place], [place] [address]          Radiology Studies: DG Chest 1 View  Result Date: 03/08/2020 CLINICAL DATA:  Midsternal chest pain. Abdominal pain. Diarrhea today. EXAM: CHEST  1 VIEW COMPARISON:  Radiograph 12/11/2019 FINDINGS: Post median sternotomy and CABG with aortic root replacement. Normal heart size with unchanged mediastinal contours. Aortic atherosclerosis. Central peribronchial thickening. Streaky right lung base atelectasis. No pleural effusion or pneumothorax. Remote left clavicle fracture.  IMPRESSION: Central peribronchial thickening, increased from prior exam suggesting acute bronchitis. Streaky right lung base atelectasis. Aortic Atherosclerosis (ICD10-I70.0). Electronically Signed   By: Keith Rake M.D.   On: 03/08/2020 14:30   DG Chest 2 View  Result Date: 03/09/2020 CLINICAL DATA:  Chest pain beginning yesterday. EXAM: CHEST - 2 VIEW COMPARISON:  03/08/2020 FINDINGS: Previous median sternotomy and CABG. Previous TAVR. Heart size is normal. Aortic atherosclerosis and tortuosity. Mild patchy density at the lung bases right worse than left suggest mild bronchopneumonia. No dense consolidation or lobar collapse. No effusion. No significant bone finding. IMPRESSION: Newly seen patchy densities in both lower lobes right more than left consistent with mild basilar pneumonia. Electronically Signed   By: Nelson Chimes M.D.   On: 03/09/2020 13:17        Scheduled Meds: . atorvastatin  80 mg Oral QHS  . Chlorhexidine Gluconate Cloth  6 each Topical Q0600  . doxycycline  100 mg Oral Q12H  . DULoxetine  30 mg Oral BID  . ferrous sulfate  325 mg Oral Daily  . furosemide  80 mg Oral BID  . insulin aspart  0-9 Units Subcutaneous TID WC  . isosorbide mononitrate  30 mg Oral Daily  . ketotifen  1 drop Both Eyes BID  . lamoTRIgine  25 mg Oral BID  . melatonin  5 mg Oral QHS  . montelukast  10 mg Oral QHS  . mupirocin ointment  1 application Nasal BID  . nicotine  14 mg Transdermal Daily  . pantoprazole  40 mg Oral Daily  . Rivaroxaban  15 mg Oral Daily  . senna  2 tablet Oral QHS  . tamsulosin  0.4 mg Oral Daily   Continuous Infusions: . sodium chloride 500 mL (03/10/20 0718)  . ceFEPime (MAXIPIME) IV 2 g (03/10/20 0726)  . vancomycin 1,500 mg (03/10/20 0832)     LOS: 0 days    Time spent: 28 minutes    Sharen Hones, MD [facility]   To contact the attending provider between 7A-7P or the covering provider during after hours 7P-7A, please log into the web  site [email or web address] and access using universal [place] password for that web site. If you do not have the password, please call the hospital operator.  03/10/2020, 9:35 AM

## 2020-03-10 NOTE — Progress Notes (Addendum)
Pharmacy Antibiotic Note  Shawn Jennings is a 74 y.o. male admitted on 03/09/2020 with pneumonia.  Pharmacy has been consulted for vanc/cefepime dosing.  Plan: Patient will receive vanc 1.5g IV load  Will continue vanc 1.25g IV q24h and cefepime 2g IV q12h per CrCl 30 - 60 ml/min  Ke 0.0401 T1/2 17.3 hrs but will round to ~ 24 hrs 15 mg/kg dose ~ 1g -- since rounding up dosing interval will increase the dose to 1.25g  Will continue to monitor and adjust doses per changing renal function.  Height: 6' (182.9 cm) Weight: 65.4 kg (144 lb 1.9 oz) IBW/kg (Calculated) : 77.6  Temp (24hrs), Avg:98 F (36.7 C), Min:97.8 F (36.6 C), Max:98.3 F (36.8 C)  Recent Labs  Lab 03/08/20 1308 03/09/20 1244 03/10/20 0617  WBC 10.7* 10.6* 11.1*  CREATININE 1.57* 1.38* 1.37*    Estimated Creatinine Clearance: 43.1 mL/min (A) (by C-G formula based on SCr of 1.37 mg/dL (H)).    No Known Allergies  Thank you for allowing pharmacy to be a part of this patient's care.  Tobie Lords, PharmD, BCPS Clinical Pharmacist 03/10/2020 6:46 AM

## 2020-03-10 NOTE — Consult Note (Signed)
Cardiology Consultation:   Patient ID: Shawn Jennings; [id number]; 1946/07/13   Admit date: 03/09/2020 Date of Consult: 03/10/2020  Primary Care Provider: [facility] Primary Cardiologist: Fletcher Anon   Patient Profile:   Shawn Jennings is a 74 y.o. male with a hx of CAD status post CABG in [place], [place] in 1994, reported A. fib on Xarelto, systolic dysfunction, mild aortic stenosis, DM2, HTN, HLD, COPD with ongoing tobacco use at 1 pack/day, PAD status post left AKA, CKD stage III, GERD, and depression who is being seen today for the evaluation of chest pain at the request of Dr. Roosevelt Locks.  History of Present Illness:   Shawn Jennings is a documented history of being a poor historian with inconsistent reporting of symptoms.  He was admitted to the hospital in 01/2019 with atypical chest pain and ruled out for an MI.  Echo done at that time showed an EF of 40 to 45% with mild aortic stenosis.  Lexiscan MPI showed a large scar in the inferior and inferior lateral area without ischemia.  EF was 37%.  He was felt to be a poor candidate for cardiac cath, though this was offered to him, and he declined.  He was managed medically.  He was seen in the ED in 11/2019 with chest pain with negative troponin and no EKG changes.  Symptoms resolved with fentanyl.  He was seen virtually in 01/2020 with stable dyspnea.  It was recommended the patient be continued on conservative treatment given his comorbid conditions and less he had ACS.  3 days ago, the patient developed substernal chest pain that was only present with deep inspiration and began while sitting in a chair.  He has also noted an associated nonproductive cough.  Symptoms feel similar to prior episode of bronchitis/pneumonia.  He has been afebrile.  Symptoms do not feel similar to his prior angina.  Due to persistent symptoms he presented to the ED for further evaluation.  Upon the patient's arrival to [facility] they were found to have stable  vitals. EKG as below, CXR showed probable basilar pneumonia. Labs showed high-sensitivity troponin of 28 with a delta unchanged and downtrending subsequently thereafter, Covid negative, PCT less than 0.10, WBC 10.7, Hgb 12.1, serum creatinine 1.57 with a baseline around approximately 1.3.  He has been started on antibiotics per internal medicine for probable bronchitis/pneumonia with noted improvement in symptoms.  Esophagram is pending for history of dysphagia.  Past Medical History:  Diagnosis Date  . CAD (coronary artery disease)    a. 1994 s/p CABG ([place], [place]); b. Reports multiple stress tests over the years.  . CKD (chronic kidney disease), stage III   . COPD (chronic obstructive pulmonary disease) ([place])   . Depression   . Essential hypertension   . GERD (gastroesophageal reflux disease)   . Hyperlipidemia LDL goal <70   . PAD (peripheral artery disease) ([place])    a. 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  Marland Kitchen PAF (paroxysmal atrial fibrillation) (H[facility])    a. CHA2DS2VASc = 4-->Xarelto 15mg  daily in setting of CKD.  Marland Kitchen Thyrotoxicosis   . Tobacco abuse   . Type II diabetes mellitus ([place])     Past Surgical History:  Procedure Laterality Date  . CORONARY ARTERY BYPASS GRAFT     a. [place], [place]  . Left AKA       Home Meds: Prior to Admission medications   Medication Sig Start Date End Date Taking? Authorizing Provider  acetaminophen (TYLENOL) 500 MG tablet  Take 500 mg by mouth every 6 (six) hours as needed.   Yes [provider]  albuterol (PROVENTIL) (2.5 MG/3ML) 0.083% nebulizer solution Take 2.5 mg by nebulization every 4 (four) hours as needed for wheezing or shortness of breath.   Yes [provider]  albuterol (VENTOLIN HFA) 108 (90 Base) MCG/ACT inhaler Inhale 2 puffs into the lungs every 6 (six) hours as needed for wheezing or shortness of breath.   Yes [provider]  aspirin EC 81 MG tablet Take 81 mg by mouth daily.   Yes [provider]  atorvastatin  (LIPITOR) 80 MG tablet Take 80 mg by mouth at bedtime.   Yes [provider]  DULoxetine (CYMBALTA) 30 MG capsule Take 30 mg by mouth 2 (two) times daily.   Yes [provider]  ferrous sulfate 325 (65 FE) MG tablet Take 325 mg by mouth daily.   Yes [provider]  Fluticasone-Salmeterol (ADVAIR) 250-50 MCG/DOSE AEPB Inhale 1 puff into the lungs 2 (two) times daily.   Yes [provider]  furosemide (LASIX) 80 MG tablet Take 80 mg by mouth 2 (two) times daily.   Yes [provider]  hydrOXYzine (ATARAX/VISTARIL) 25 MG tablet Take 25 mg by mouth every 6 (six) hours as needed (allergy symptoms).   Yes [provider]  ketotifen (ZADITOR) 0.025 % ophthalmic solution Place 1 drop into both eyes 2 (two) times daily as needed.    Yes [provider]  lactulose (CHRONULAC) 10 GM/15ML solution Take 30 g by mouth every 12 (twelve) hours as needed for mild constipation.   Yes [provider]  lamoTRIgine (LAMICTAL) 25 MG tablet Take 25 mg by mouth 2 (two) times daily.   Yes [provider]  lidocaine (LIDODERM) 5 % Cut 1 patch in half and apply to amputated stump once daily 12 hours on, 12 hours off   Yes [provider]  liver oil-zinc oxide (DESITIN) 40 % ointment Apply 1 application topically at bedtime as needed for irritation. To buttocks    Yes [provider]  LORazepam (ATIVAN) 0.5 MG tablet Take 0.5 mg by mouth 2 (two) times daily as needed for anxiety.   Yes [provider]  Melatonin 3 MG TABS Take 1 tablet by mouth at bedtime.   Yes [provider]  metFORMIN (GLUCOPHAGE) 500 MG tablet Take 500 mg by mouth 2 (two) times daily with a meal.    Yes [provider]  montelukast (SINGULAIR) 10 MG tablet Take 10 mg by mouth at bedtime.   Yes [provider]  Multiple Vitamins-Minerals (PRESERVISION AREDS 2) CAPS Take 1 capsule by mouth 2 (two) times daily.   Yes [provider]  nicotine (NICODERM CQ - DOSED IN MG/24 HOURS) 14 mg/24hr patch Place 14 mg onto the skin daily.   Yes [provider]  nitroGLYCERIN (NITROSTAT) 0.4 MG SL tablet Place 0.4 mg under the tongue every 5 (five) minutes as needed for chest pain.   Yes [provider]  Oxycodone HCl 10 MG TABS Take 1 tablet (10 mg total) by mouth every 8 (eight) hours as needed (pain). 01/28/19  Yes Pyreddy, Reatha Harps, MD  pantoprazole (PROTONIX) 40 MG tablet Take 40 mg by mouth daily.   Yes [provider]  potassium chloride (MICRO-K) 10 MEQ CR capsule Take 10 mEq by mouth daily. 12/11/19  Yes [provider]  Rivaroxaban (XARELTO) 15 MG TABS tablet Take 15 mg by mouth daily.  Yes [provider]  senna (SENOKOT) 8.6 MG tablet Take 2 tablets by mouth at bedtime.   Yes [provider]  tamsulosin (FLOMAX) 0.4 MG CAPS capsule Take 0.4 mg by mouth daily.   Yes [provider]  traZODone (DESYREL) 50 MG tablet Take 25 mg by mouth at bedtime as needed for sleep.   Yes [provider]  triamcinolone cream (KENALOG) 0.1 % Apply 1 application topically 2 (two) times daily.   Yes [provider]    Inpatient Medications: Scheduled Meds: . amoxicillin-clavulanate  1 tablet Oral Q12H  . atorvastatin  80 mg Oral QHS  . Chlorhexidine Gluconate Cloth  6 each Topical Q0600  . doxycycline  100 mg Oral Q12H  . DULoxetine  30 mg Oral BID  . ferrous sulfate  325 mg Oral Daily  . furosemide  80 mg Oral BID  . insulin aspart  0-9 Units Subcutaneous TID WC  . isosorbide mononitrate  30 mg Oral Daily  . ketotifen  1 drop Both Eyes BID  . lamoTRIgine  25 mg Oral BID  . melatonin  5 mg Oral QHS  . montelukast  10 mg Oral QHS  . mupirocin ointment  1 application Nasal BID  . nicotine  14 mg Transdermal Daily  . pantoprazole  40 mg Oral Daily  . potassium chloride  40 mEq Oral BID  . Rivaroxaban  15 mg Oral Daily  . senna  2 tablet Oral QHS   . tamsulosin  0.4 mg Oral Daily   Continuous Infusions: . sodium chloride 500 mL (03/10/20 0718)  . magnesium sulfate bolus IVPB 2 g (03/10/20 1053)   PRN Meds: sodium chloride, acetaminophen, albuterol, hydrOXYzine, lactulose, LORazepam, nitroGLYCERIN, traMADol, traZODone  Allergies:  No Known Allergies  Social History:   Social History   Socioeconomic History  . Marital status: Single    Spouse name: Not on file  . Number of children: Not on file  . Years of education: Not on file  . Highest education level: Not on file  Occupational History  . Not on file  Tobacco Use  . Smoking status: Current Every Day Smoker    Packs/day: 1.00    Years: 62.00    Pack years: 62.00  . Smokeless tobacco: Never Used  Substance and Sexual Activity  . Alcohol use: Never  . Drug use: Never  . Sexual activity: Not on file  Other Topics Concern  . Not on file  Social History Narrative   Retired from Architect.  From [place], [place]. Moved to [place] ~ 2011 (pt initially said that he moved here in Feb 2020, but then said 9 yrs ago).   Social Determinants of Health   Financial Resource Strain:   . Difficulty of Paying Living Expenses:   Food Insecurity:   . Worried About Charity fundraiser in the Last Year:   . Arboriculturist in the Last Year:   Transportation Needs:   . Film/video editor (Medical):   Marland Kitchen Lack of Transportation (Non-Medical):   Physical Activity:   . Days of Exercise per Week:   . Minutes of Exercise per Session:   Stress:   . Feeling of Stress :   Social Connections:   . Frequency of Communication with Friends and Family:   . Frequency of Social Gatherings with Friends and Family:   . Attends Religious Services:   . Active Member of Clubs or Organizations:   . Attends Archivist Meetings:   .  Marital Status:   Intimate Partner Violence:   . Fear of Current or Ex-Partner:   . Emotionally Abused:   Marland Kitchen Physically Abused:   . Sexually Abused:       Family History:   Family History  Problem Relation Age of Onset  . Heart attack Mother        died @ 69.  . Other Father        never knew his father.  . Other Sister        overdose of sleeping pills.  . Heart disease Brother        died in his 36's  . Other Sister        complication of abd surgery @ 24  . CAD Sister     ROS:  Review of Systems  Constitutional: Positive for malaise/fatigue. Negative for chills, diaphoresis, fever and weight loss.  HENT: Negative for congestion.   Eyes: Negative for discharge and redness.  Respiratory: Positive for cough. Negative for hemoptysis, sputum production, shortness of breath and wheezing.   Cardiovascular: Positive for chest pain. Negative for palpitations, orthopnea, claudication, leg swelling and PND.       Pleuritic chest pain  Gastrointestinal: Negative for abdominal pain, blood in stool, heartburn, melena, nausea and vomiting.  Musculoskeletal: Negative for falls and myalgias.  Skin: Negative for rash.  Neurological: Negative for dizziness, tingling, tremors, sensory change, speech change, focal weakness, loss of consciousness and weakness.  Endo/Heme/Allergies: Does not bruise/bleed easily.  Psychiatric/Behavioral: Negative for substance abuse. The patient is not nervous/anxious.   All other systems reviewed and are negative.     Physical Exam/Data:   Vitals:   03/10/20 0047 03/10/20 0416 03/10/20 0559 03/10/20 0746  BP: 122/68 100/66 (!) 116/58 111/74  Pulse: 70 80 71 72  Resp: 20 20 20 18   Temp: 97.8 F (36.6 C) 97.9 F (36.6 C)  97.9 F (36.6 C)  TempSrc: Oral Oral  Oral  SpO2: 96% 95% 92% 94%  Weight:  65.4 kg    Height:        Intake/Output Summary (Last 24 hours) at 03/10/2020 1116 Last data filed at 03/10/2020 0945 Gross per 24 hour  Intake 360 ml  Output 400 ml  Net -40 ml   Filed Weights   03/09/20 1234 03/10/20 0416  Weight: 64.9 kg 65.4 kg   Body mass index is 19.55 kg/m.   Physical Exam:  General: Well developed, well nourished, in no acute distress. Head: Normocephalic, atraumatic, sclera non-icteric, no xanthomas, nares without discharge.  Neck: Negative for carotid bruits. JVD not elevated. Lungs: Clear bilaterally to auscultation without wheezes, rales, or rhonchi. Breathing is unlabored. Deep inspiration on exam reproduces his pain.  Heart: RRR with S1 S2. No murmurs, rubs, or gallops appreciated. Abdomen: Soft, non-tender, non-distended with normoactive bowel sounds. No hepatomegaly. No rebound/guarding. No obvious abdominal masses. Msk:  Strength and tone appear normal for age. Extremities: No clubbing or cyanosis. No edema. Distal pedal pulses are 2+ and equal bilaterally. Neuro: Alert and oriented X 3. No facial asymmetry. No focal deficit. Moves all extremities spontaneously. Psych:  Responds to questions appropriately with a normal affect.   EKG:  The EKG was personally reviewed and demonstrates: NSR, 93 bpm, nonspecific ST-T changes Telemetry:  Telemetry was personally reviewed and demonstrates: SR  Weights: Filed Weights   03/09/20 1234 03/10/20 0416  Weight: 64.9 kg 65.4 kg    Relevant CV Studies: As above  Laboratory Data:  Chemistry Recent Labs  Lab  03/08/20 1308 03/09/20 1244 03/10/20 0617  NA 141 141 142  K 3.6 3.8 3.4*  CL 106 108 107  CO2 25 23 24   GLUCOSE 140* 151* 169*  BUN 21 18 17   CREATININE 1.57* 1.38* 1.37*  CALCIUM 8.2* 8.4* 8.5*  GFRNONAA 42* 50* 50*  GFRAA 49* 58* 58*  ANIONGAP 10 10 11     No results for input(s): PROT, ALBUMIN, AST, ALT, ALKPHOS, BILITOT in the last 168 hours. Hematology Recent Labs  Lab 03/08/20 1308 03/09/20 1244 03/10/20 0617  WBC 10.7* 10.6* 11.1*  RBC 4.35 4.41 4.33  HGB 12.1* 12.4* 11.9*  HCT 37.1* 37.5* 36.5*  MCV 85.3 85.0 84.3  MCH 27.8 28.1 27.5  MCHC 32.6 33.1 32.6  RDW 14.3 14.2 14.1  PLT 223 232 210   Cardiac EnzymesNo results for input(s): TROPONINI in the last 168 hours. No  results for input(s): TROPIPOC in the last 168 hours.  BNPNo results for input(s): BNP, PROBNP in the last 168 hours.  DDimer No results for input(s): DDIMER in the last 168 hours.  Radiology/Studies:  DG Chest 1 View  Result Date: 03/08/2020 IMPRESSION: Central peribronchial thickening, increased from prior exam suggesting acute bronchitis. Streaky right lung base atelectasis. Aortic Atherosclerosis (ICD10-I70.0). Electronically Signed   By: Keith Rake M.D.   On: 03/08/2020 14:30   DG Chest 2 View  Result Date: 03/09/2020 IMPRESSION: Newly seen patchy densities in both lower lobes right more than left consistent with mild basilar pneumonia. Electronically Signed   By: Nelson Chimes M.D.   On: 03/09/2020 13:17    Assessment and Plan:   1.  Atypical chest pain/elevated troponin: -Symptoms are pleuritic in etiology and consistent with prior episodes of bronchitis/pneumonia -His minimally elevated and nontrending high-sensitivity troponin is not consistent with ACS -No indication for heparin drip -Previously felt to not be a good candidate for invasive procedure secondary to significant comorbid conditions unless there was ACS -Check echo, if stable EF no plan for further cardiac evaluation in the inpatient setting -PE less likely with Xarelto, he reports compliance, further consideration of this is deferred to IM -Management per IM  2.  CAD involving the native coronary arteries with atypical chest pain and demand ischemia: -Symptoms atypical as outlined above -Plan as above -Continue Xarelto in place of aspirin secondary to reported A. fib as well as PTA Lipitor  3.  Reported A. fib: -Details of this are unclear -Maintaining sinus rhythm -Not requiring AV nodal blocking agents -Remains on PTA renally dosed Xarelto -Estimated Creatinine Clearance: 43.1 mL/min (A) (by C-G formula based on SCr of 1.37 mg/dL (H)).   4.  Hypomagnesemia/hypokalemia: -Recommend repletion of  magnesium and potassium to goal 2.0 and 4.0, respectively per primary service  5.  Bronchitis/pneumonia: -Management per primary service  6.  PAD: -Status post left AKA -Stable  7.  HFpEF: -Appears euvolemic -Remains on PTA furosemide -Renal function stable  8.  CKD stage III: -Stable    For questions or updates, please contact [place] Please consult [email or web address] for contact info under Cardiology/STEMI.   Signed, Christell Faith, PA-C [place] Pager: [phone] 03/10/2020, 11:16 AM

## 2020-03-10 NOTE — Evaluation (Signed)
Clinical/Bedside Swallow Evaluation Patient Details  Name: Shawn Jennings MRN: [id number] Date of Birth: September 13, 1946  Today's Date: 03/10/2020 Time: SLP Start Time (ACUTE ONLY): 1031 SLP Stop Time (ACUTE ONLY): 1105 SLP Time Calculation (min) (ACUTE ONLY): 34 min  Past Medical History:  Past Medical History:  Diagnosis Date  . CAD (coronary artery disease)    a. 1994 s/p CABG ([place], [place]); b. Reports multiple stress tests over the years.  . CKD (chronic kidney disease), stage III   . COPD (chronic obstructive pulmonary disease) ([place])   . Depression   . Essential hypertension   . GERD (gastroesophageal reflux disease)   . Hyperlipidemia LDL goal <70   . PAD (peripheral artery disease) ([place])    a. 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  Marland Kitchen PAF (paroxysmal atrial fibrillation) ([facility])    a. CHA2DS2VASc = 4-->Xarelto 15mg  daily in setting of CKD.  Marland Kitchen Thyrotoxicosis   . Tobacco abuse   . Type II diabetes mellitus ([place])    Past Surgical History:  Past Surgical History:  Procedure Laterality Date  . CORONARY ARTERY BYPASS GRAFT     a. [place], [place]  . Left AKA     HPI:  Patient is a 74 year old male with past medical history of coronary disease, chronic kidney disease stage IIIa, COPD, depression, essential hypertension, dyslipidemia, peripheral vascular disease, paroxysmal atrial fibrillation on Xarelto, type 2 diabetes and continued tobacco abuse who presents to the hospital complaining of chest pain. On 5/20 (over night) patient had a continued pleuritic chest pain.  Chest x-ray showed bilateral lower lobe infiltrates. NO history of dysphagia in pt's chart.   Assessment / Plan / Recommendation Clinical Impression  Pt presents with adequate oropharyngeal abilities with no overt s/s of aspiration or dysphagia when consuming regular solids and thin liquids. Education was provided on general aspiration precautions. Pt able to demonstrate understanding. Nursing was consulted with no report of dysphagia or  aspiration observed. At this time, ST will sign off and can be re consulted if pt's condition changes.   SLP Visit Diagnosis: Dysphagia, unspecified (R13.10)    Aspiration Risk  No limitations    Diet Recommendation   Age appropriate regular with thin liquids  Medication Administration: Whole meds with liquid    Other  Recommendations Oral Care Recommendations: Oral care BID   Follow up Recommendations None      Frequency and Duration   N/A         Prognosis   N/A     Swallow Study   General Date of Onset: 03/09/20 HPI: Patient is a 74 year old male with past medical history of coronary disease, chronic kidney disease stage IIIa, COPD, depression, essential hypertension, dyslipidemia, peripheral vascular disease, paroxysmal atrial fibrillation on Xarelto, type 2 diabetes and continued tobacco abuse who presents to the hospital complaining of chest pain. On 5/20 (over night) patient had a continued pleuritic chest pain.  Chest x-ray showed bilateral lower lobe infiltrates. NO history of dysphagia in pt's chart. Type of Study: Bedside Swallow Evaluation Previous Swallow Assessment: none in chart Diet Prior to this Study: Regular;Thin liquids Temperature Spikes Noted: No Respiratory Status: Room air History of Recent Intubation: No Behavior/Cognition: Alert;Cooperative;Pleasant mood Oral Cavity Assessment: Within Functional Limits Oral Care Completed by SLP: No Oral Cavity - Dentition: Adequate natural dentition Vision: Functional for self-feeding Self-Feeding Abilities: Able to feed self Patient Positioning: Upright in bed Baseline Vocal Quality: Normal Volitional Cough: Strong Volitional Swallow: Able to elicit    Oral/Motor/Sensory Function Overall Oral Motor/Sensory  Function: Within functional limits   Ice Chips Ice chips: Not tested   Thin Liquid Thin Liquid: Within functional limits Presentation: Self Fed;Cup;Straw    Nectar Thick Nectar Thick Liquid: Not tested    Honey Thick Honey Thick Liquid: Not tested   Puree Puree: Within functional limits Presentation: Self Fed;Spoon   Solid    Happi B. Rutherford Nail, M.S., CCC-SLP, [facility] Speech-Language Pathologist Rehabilitation Services Office [phone]  Solid: Within functional limits Presentation: Self Fed      Happi Overton 03/10/2020,12:33 PM

## 2020-03-10 NOTE — Progress Notes (Signed)
Cross coverage brief note Patient admitted with eval for chest pain. Pain ongoing 6/10 on arrival to floor and worse with deep breath more consistant with pleuritic type or pneumonia type pain.  Chest xray noting bilateral pneumonia new finding from prior film showing a bronchitis process.  A procalcitonin has been ordered as well as lactic acid (soft blood pressures but more likely related to use of nitro for his chest pain) Patient denied any relief from nitro given previously for his chest pain and reports experiences stomach upset with acetaminophen.  He does take motrin over the counter quite frequently for pain at home without any relief and he has been counseled on discontinuing use of NSAIDS.  He has been ordered ultram for any non cardiac pain Cefepime and vanc have been ordered for HCAP , however he does still have labs pending.  RN has understands need to collect labs prior to starting. Antibiotics can be tailored accordingly.

## 2020-03-10 NOTE — Progress Notes (Addendum)
This RN was called to room as patient complains of chest pain that awoke him from sleep. He denies SOB; VS reassessed and B. Randol Kern, on call APP notified. Orders received for bloodwork and urine sample; urine sent. Patient also swabbed for MRSA per protocol. Covid still in process. BP soft, and decision made to hold off giving NTG, narcotics, or Ativan. Patient offered Tylenol and he refused this. States has not been taking any anxiety med at home for this pain, although he has ativan on his PTA med list. BRandol Kern in to see patient.  Will await further orders. Telemetry revealed NSR with occ PVC's   Of note, CXR done yesterday revealed the following: "IMPRESSION Newly seen patchy densities in both lower lobes right more than left consistent with mild basilar pneumonia."  Patient is aware of CXR results, asking appropriate questions about what the plan is moving forward. He is also aware of the plan to see cardiologist today.  [id number] Update: Critical alert called to this RN. Lactic acid 2.2. B. Morrison notified.

## 2020-03-11 ENCOUNTER — Inpatient Hospital Stay: Payer: Medicare Other

## 2020-03-11 LAB — CBC WITH DIFFERENTIAL/PLATELET
Abs Immature Granulocytes: 0.06 10*3/uL (ref 0.00–0.07)
Basophils Absolute: 0.1 10*3/uL (ref 0.0–0.1)
Basophils Relative: 1 %
Eosinophils Absolute: 0.8 10*3/uL — ABNORMAL HIGH (ref 0.0–0.5)
Eosinophils Relative: 7 %
HCT: 35.8 % — ABNORMAL LOW (ref 39.0–52.0)
Hemoglobin: 12 g/dL — ABNORMAL LOW (ref 13.0–17.0)
Immature Granulocytes: 1 %
Lymphocytes Relative: 16 %
Lymphs Abs: 1.7 10*3/uL (ref 0.7–4.0)
MCH: 27.6 pg (ref 26.0–34.0)
MCHC: 33.5 g/dL (ref 30.0–36.0)
MCV: 82.3 fL (ref 80.0–100.0)
Monocytes Absolute: 0.8 10*3/uL (ref 0.1–1.0)
Monocytes Relative: 7 %
Neutro Abs: 7.4 10*3/uL (ref 1.7–7.7)
Neutrophils Relative %: 68 %
Platelets: 192 10*3/uL (ref 150–400)
RBC: 4.35 MIL/uL (ref 4.22–5.81)
RDW: 14 % (ref 11.5–15.5)
WBC: 10.8 10*3/uL — ABNORMAL HIGH (ref 4.0–10.5)
nRBC: 0 % (ref 0.0–0.2)

## 2020-03-11 LAB — GLUCOSE, CAPILLARY
Glucose-Capillary: 144 mg/dL — ABNORMAL HIGH (ref 70–99)
Glucose-Capillary: 175 mg/dL — ABNORMAL HIGH (ref 70–99)

## 2020-03-11 LAB — BASIC METABOLIC PANEL
Anion gap: 11 (ref 5–15)
BUN: 20 mg/dL (ref 8–23)
CO2: 22 mmol/L (ref 22–32)
Calcium: 8.4 mg/dL — ABNORMAL LOW (ref 8.9–10.3)
Chloride: 107 mmol/L (ref 98–111)
Creatinine, Ser: 1.42 mg/dL — ABNORMAL HIGH (ref 0.61–1.24)
GFR calc Af Amer: 56 mL/min — ABNORMAL LOW (ref >60)
GFR calc non Af Amer: 48 mL/min — ABNORMAL LOW (ref >60)
Glucose, Bld: 157 mg/dL — ABNORMAL HIGH (ref 70–99)
Potassium: 3 mmol/L — ABNORMAL LOW (ref 3.5–5.1)
Sodium: 140 mmol/L (ref 135–145)

## 2020-03-11 LAB — LEGIONELLA PNEUMOPHILA SEROGP 1 UR AG: L. pneumophila Serogp 1 Ur Ag: NEGATIVE

## 2020-03-11 LAB — MAGNESIUM: Magnesium: 2.1 mg/dL (ref 1.7–2.4)

## 2020-03-11 MED ORDER — POTASSIUM CHLORIDE CRYS ER 20 MEQ PO TBCR: 40.0000 meq | EXTENDED_RELEASE_TABLET | ORAL | Status: DC

## 2020-03-11 MED ORDER — POTASSIUM CHLORIDE 10 MEQ/100ML IV SOLN
10.0000 meq | INTRAVENOUS | Status: DC
  Administered 2020-03-11: 10 meq via INTRAVENOUS
  Filled 2020-03-11 (×2): qty 100

## 2020-03-11 MED ORDER — POTASSIUM CHLORIDE CRYS ER 20 MEQ PO TBCR
80.0000 meq | EXTENDED_RELEASE_TABLET | ORAL | Status: AC
  Administered 2020-03-11: 80 meq via ORAL
  Filled 2020-03-11: qty 4

## 2020-03-11 MED ORDER — ISOSORBIDE MONONITRATE ER 30 MG PO TB24: 30.0000 mg | ORAL_TABLET | Freq: Every day | ORAL | 0 refills | Status: AC

## 2020-03-11 MED ORDER — POTASSIUM CHLORIDE ER 10 MEQ PO CPCR: 20.0000 meq | ORAL_CAPSULE | Freq: Every day | ORAL | 0 refills | Status: DC

## 2020-03-11 MED ORDER — POTASSIUM CHLORIDE 10 MEQ/100ML IV SOLN
10.0000 meq | INTRAVENOUS | Status: DC
  Filled 2020-03-11 (×4): qty 100

## 2020-03-11 NOTE — Discharge Summary (Signed)
Physician Discharge Summary  Patient ID: Shawn Jennings MRN: [id number] DOB/AGE: [birth year] 74 y.o.  Admit date: 03/09/2020 Discharge date: 03/11/2020  Admission Diagnoses:  Discharge Diagnoses:  Active Problems:   Chest pain   Chronic kidney disease, stage 3a   Essential hypertension   Other chest pain   Discharged Condition: good  Hospital Course:  Patient is a 74 year old male with past medical history of coronary disease, chronic kidney disease stage IIIa, COPD, depression, essential hypertension, dyslipidemia, peripheral vascular disease, paroxysmal atrial fibrillation on Xarelto, type 2 diabetes and continued tobacco abuse who presents to the hospital complaining of chest pain. 5/20.  Overnight, patient had a continued pleuritic chest pain.  Chest x-ray showed bilateral lower lobe infiltrates.  Procalcitonin level less than 0.01.  #1.  Chest pain. Chest pain still seem to be worse with deep breaths. Procalcitonin level less than 0.01, not consistent with bacterial pneumonia.  Patient does has occasional problem with swallowing, barium esophagram showed small hiatal hernia.  Per cardiology, patient has chronic stable angina.  However, currently chest pain is noncardiac.  This could be secondary to hiatal hernia.  Patient pain has resolved today.  #2. Atrial fibrillation. Continue anticoagulation.  #3.  Type 2 diabetes. Resume Metformin.  Patient has a mild lactic acidosis secondary to Metformin.  4.  COPD with chronic smoking. No exacerbation.  5.  Essential hypertension. Continue home medicines.  6.  Chronic kidney disease stage 3a.  Follow.  7.  Hypokalemia. Will give oral plus IV today.  I also increased oral supplemental potassium 20 mEq daily for 10 days.   Consults: cardiology  Significant Diagnostic Studies:  ESOPHOGRAM/BARIUM SWALLOW  TECHNIQUE: Single contrast examination was performed using  thin barium.  FLUOROSCOPY TIME:  Fluoroscopy Time:  0  minutes 48 seconds  Radiation Exposure Index (if provided by the fluoroscopic device): 8.3 mGy  COMPARISON:  No prior.  FINDINGS: Mild prominence of the cricopharyngeus fold. Thoracic esophagus is widely patent. Small sliding hiatal hernia. Prior CABG and cardiac valve replacement. No prominent reflux noted.  IMPRESSION: Mild prominence of the cricopharyngeus fold. Small sliding hiatal hernia. No significant esophageal stenosis noted. No obstructing abnormality.   Electronically Signed   By: Marcello Moores  Register   On: 03/11/2020 08:23   Treatments: Monitor, augmentin, doxycycline  Discharge Exam: Blood pressure 132/74, pulse 79, temperature 97.7 F (36.5 C), temperature source Oral, resp. rate 19, height 6' (1.829 m), weight 64.6 kg, SpO2 96 %. General appearance: alert and cooperative Resp: clear to auscultation bilaterally and Decreased breathing sounds with some rhonchi in the base. Cardio: regular rate and rhythm, S1, S2 normal, no murmur, click, rub or gallop and Irregular no murmurs. GI: soft, non-tender; bowel sounds normal; no masses,  no organomegaly Extremities: No edema, left AKA.  Disposition: Discharge disposition: 01-Home or Self Care       Discharge Instructions    Diet - low sodium heart healthy   Complete by: As directed    Increase activity slowly   Complete by: As directed      Allergies as of 03/11/2020   No Known Allergies     Medication List    STOP taking these medications   Oxycodone HCl 10 MG Tabs     TAKE these medications   acetaminophen 500 MG tablet Commonly known as: TYLENOL Take 500 mg by mouth every 6 (six) hours as needed.   albuterol (2.5 MG/3ML) 0.083% nebulizer solution Commonly known as: PROVENTIL Take 2.5 mg by nebulization every 4 (four) hours as  needed for wheezing or shortness of breath.   Ventolin HFA 108 (90 Base) MCG/ACT inhaler Generic drug: albuterol Inhale 2 puffs into the lungs every 6 (six) hours as  needed for wheezing or shortness of breath.   aspirin EC 81 MG tablet Take 81 mg by mouth daily.   atorvastatin 80 MG tablet Commonly known as: LIPITOR Take 80 mg by mouth at bedtime.   DULoxetine 30 MG capsule Commonly known as: CYMBALTA Take 30 mg by mouth 2 (two) times daily.   ferrous sulfate 325 (65 FE) MG tablet Take 325 mg by mouth daily.   Fluticasone-Salmeterol 250-50 MCG/DOSE Aepb Commonly known as: ADVAIR Inhale 1 puff into the lungs 2 (two) times daily.   furosemide 80 MG tablet Commonly known as: LASIX Take 80 mg by mouth 2 (two) times daily.   hydrOXYzine 25 MG tablet Commonly known as: ATARAX/VISTARIL Take 25 mg by mouth every 6 (six) hours as needed (allergy symptoms).   isosorbide mononitrate 30 MG 24 hr tablet Commonly known as: IMDUR Take 1 tablet (30 mg total) by mouth daily.   ketotifen 0.025 % ophthalmic solution Commonly known as: ZADITOR Place 1 drop into both eyes 2 (two) times daily as needed.   lactulose 10 GM/15ML solution Commonly known as: CHRONULAC Take 30 g by mouth every 12 (twelve) hours as needed for mild constipation.   lamoTRIgine 25 MG tablet Commonly known as: LAMICTAL Take 25 mg by mouth 2 (two) times daily.   lidocaine 5 % Commonly known as: LIDODERM Cut 1 patch in half and apply to amputated stump once daily 12 hours on, 12 hours off   liver oil-zinc oxide 40 % ointment Commonly known as: DESITIN Apply 1 application topically at bedtime as needed for irritation. To buttocks   LORazepam 0.5 MG tablet Commonly known as: ATIVAN Take 0.5 mg by mouth 2 (two) times daily as needed for anxiety.   melatonin 3 MG Tabs tablet Take 1 tablet by mouth at bedtime.   metFORMIN 500 MG tablet Commonly known as: GLUCOPHAGE Take 500 mg by mouth 2 (two) times daily with a meal.   montelukast 10 MG tablet Commonly known as: SINGULAIR Take 10 mg by mouth at bedtime.   nicotine 14 mg/24hr patch Commonly known as: NICODERM CQ -  dosed in mg/24 hours Place 14 mg onto the skin daily.   nitroGLYCERIN 0.4 MG SL tablet Commonly known as: NITROSTAT Place 0.4 mg under the tongue every 5 (five) minutes as needed for chest pain.   pantoprazole 40 MG tablet Commonly known as: PROTONIX Take 40 mg by mouth daily.   potassium chloride 10 MEQ CR capsule Commonly known as: MICRO-K Take 2 capsules (20 mEq total) by mouth daily for 10 days. What changed: how much to take   PreserVision AREDS 2 Caps Take 1 capsule by mouth 2 (two) times daily.   Rivaroxaban 15 MG Tabs tablet Commonly known as: XARELTO Take 15 mg by mouth daily.   senna 8.6 MG tablet Commonly known as: SENOKOT Take 2 tablets by mouth at bedtime.   tamsulosin 0.4 MG Caps capsule Commonly known as: FLOMAX Take 0.4 mg by mouth daily.   traZODone 50 MG tablet Commonly known as: DESYREL Take 25 mg by mouth at bedtime as needed for sleep.   triamcinolone cream 0.1 % Commonly known as: KENALOG Apply 1 application topically 2 (two) times daily.      Follow-up Information    [facility] Follow up in 1 week(s).  Specialty: Geriatric Medicine Contact information: [id number] [place] [address] [phone]        Wellington Hampshire, MD Follow up in 1 week(s).   Specialty: Cardiology Contact information: [place] [address] [phone]           Signed: Sharen Hones 03/11/2020, 12:44 PM

## 2020-03-11 NOTE — Progress Notes (Signed)
Discharge report called to [facility]/ verbalized an understanding/ iv and tele removed/ EMS called to transport

## 2020-03-30 ENCOUNTER — Other Ambulatory Visit: Payer: Self-pay

## 2020-03-30 ENCOUNTER — Encounter: Payer: Self-pay | Admitting: Nurse Practitioner

## 2020-03-30 ENCOUNTER — Ambulatory Visit (INDEPENDENT_AMBULATORY_CARE_PROVIDER_SITE_OTHER): Payer: Medicare Other | Admitting: Nurse Practitioner

## 2020-03-30 VITALS — BP 100/64 | HR 93 | Ht 74.0 in | Wt 139.0 lb

## 2020-03-30 DIAGNOSIS — I5022 Chronic systolic (congestive) heart failure: Secondary | ICD-10-CM | POA: Diagnosis not present

## 2020-03-30 DIAGNOSIS — I480 Paroxysmal atrial fibrillation: Secondary | ICD-10-CM

## 2020-03-30 DIAGNOSIS — E785 Hyperlipidemia, unspecified: Secondary | ICD-10-CM

## 2020-03-30 DIAGNOSIS — I5020 Unspecified systolic (congestive) heart failure: Secondary | ICD-10-CM | POA: Diagnosis not present

## 2020-03-30 DIAGNOSIS — I255 Ischemic cardiomyopathy: Secondary | ICD-10-CM

## 2020-03-30 DIAGNOSIS — N183 Chronic kidney disease, stage 3 unspecified: Secondary | ICD-10-CM

## 2020-03-30 DIAGNOSIS — I2510 Atherosclerotic heart disease of native coronary artery without angina pectoris: Secondary | ICD-10-CM | POA: Diagnosis not present

## 2020-03-30 DIAGNOSIS — E876 Hypokalemia: Secondary | ICD-10-CM

## 2020-03-30 DIAGNOSIS — I38 Endocarditis, valve unspecified: Secondary | ICD-10-CM

## 2020-03-30 NOTE — Patient Instructions (Signed)
Medication Instructions:  Your physician recommends that you continue on your current medications as directed. Please refer to the Current Medication list given to you today.  *If you need a refill on your cardiac medications before your next appointment, please call your pharmacy*   Lab Work: Your physician recommends that you have lab work today(BMET) If you have labs (blood work) drawn today and your tests are completely normal, you will receive your results only by: Marland Kitchen MyChart Message (if you have MyChart) OR . A paper copy in the mail If you have any lab test that is abnormal or we need to change your treatment, we will call you to review the results.   Testing/Procedures: 1- Echo  Please return to [facility] on ______________ at _______________ AM/PM for an Echocardiogram. Your physician has requested that you have an echocardiogram. Echocardiography is a painless test that uses sound waves to create images of your heart. It provides your doctor with information about the size and shape of your heart and how well your heart's chambers and valves are working. This procedure takes approximately one hour. There are no restrictions for this procedure. Please note; depending on visual quality an IV may need to be placed.     Follow-Up: At [facility], you and your health needs are our priority.  As part of our continuing mission to provide you with exceptional heart care, we have created designated Provider Care Teams.  These Care Teams include your primary Cardiologist (physician) and Advanced Practice Providers (APPs -  Physician Assistants and Nurse Practitioners) who all work together to provide you with the care you need, when you need it.  We recommend signing up for the patient portal called "MyChart".  Sign up information is provided on this After Visit Summary.  MyChart is used to connect with patients for Virtual Visits (Telemedicine).  Patients are able to view  lab/test results, encounter notes, upcoming appointments, etc.  Non-urgent messages can be sent to your provider as well.   To learn more about what you can do with MyChart, go to [email or web address].    Your next appointment:   3 month(s)  The format for your next appointment:   In Person  Provider:    You may see [name], MD or Murray Hodgkins, NP

## 2020-03-30 NOTE — Progress Notes (Signed)
Office Visit    Patient Name: Shawn Jennings Date of Encounter: 03/30/2020  Primary Care Provider:  [facility] Primary Cardiologist:  [name], MD  Chief Complaint    74 year old male with history of CAD status post CABG in 1994, chronic chest pain, hypertension, hyperlipidemia, paroxysmal atrial fibrillation, ICM, AS s/p AVR, peripheral arterial disease status post left AKA, type 2 diabetes mellitus, GERD, and depression, who presents for follow-up after recent hospitalization for atypical chest pain.  Past Medical History    Past Medical History:  Diagnosis Date  . Aortic stenosis    a. Pt unaware of history but CT imaging 01/2019 consistent w/ AVR; b. 01/2019 Echo: Mild to mod AS. Mean grad 62mmHg.  Marland Kitchen CAD (coronary artery disease)    a. 1994 s/p CABG ([place]); b. Reports multiple stress tests over the years w/o repeat cath; c. 01/2019 MV: large, sev, fixed inf and inflat defect extending to apex. No ischemia. EF 37%-->Med rx given atypical Ss and pt wishes.  . CKD (chronic kidney disease), stage III   . COPD (chronic obstructive pulmonary disease) ([place])   . Depression   . Essential hypertension   . GERD (gastroesophageal reflux disease)   . Hyperlipidemia LDL goal <70   . Ischemic cardiomyopathy    a. 01/2019 Echo: EF 40-45%, impaired relaxation. Mildly dil LA. Sev mitral annular Ca2+. Mild to mod AS.  Marland Kitchen PAD (peripheral artery disease) ([place])    a. 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  Marland Kitchen PAF (paroxysmal atrial fibrillation) ([facility])    a. CHA2DS2VASc = 4-->Xarelto 15mg  daily in setting of CKD.  Marland Kitchen Thyrotoxicosis   . Tobacco abuse   . Type II diabetes mellitus ([place])    Past Surgical History:  Procedure Laterality Date  . CORONARY ARTERY BYPASS GRAFT     a. [place], [place]  . Left AKA      Allergies  No Known Allergies  History of Present Illness    74 year old male with the above complex past medical history including CAD status post remote coronary artery bypass  grafting in [place] in 1994.  Other history includes hypertension, hyperlipidemia, ICM, AS s/p AVR, type 2 diabetes mellitus, COPD, tobacco abuse, possibly paroxysmal atrial fibrillation (he is not sure, but is on Xarelto), peripheral arterial disease status post left AKA, GERD, depression, and stage III chronic kidney disease.  He has a long history of chest pain with multiple stress test over the years.  He was admitted to [facility] in April 2020 with chest pain.  Stress testing was potentially high risk with a large, severe, fixed inferior and inferolateral defect extending to the apex.  No ischemia was noted.  EF was 37%.  Echo showed an EF of 40-45%.  Patient preferred to avoid catheterization and he has been medically managed since.  He was most recently admitted in late May with recurrent atypical chest pain that was worse with deep breathing.  Troponins were normal.  He was seen by our team with recommendation for follow-up echo, which was not performed.  His chest x-ray showed bilateral lower lobe infiltrates however procalcitonin was less than 0.01 and this was not felt to be consistent with bacterial pneumonia.  He did undergo barium esophagram that showed a small hiatal hernia.  Pain was ultimately felt to be noncardiac and he was subsequently discharged home May 21.  Since hospital discharge, he has been at [facility] and notes that he has done exceptionally well.  He  denies chest pain, palpitations, dyspnea, pnd, orthopnea, n, v, dizziness, syncope, edema, weight gain, or early satiety. He is w/c bound and is able to wheel himself without symptoms or limitations.  Home Medications    Prior to Admission medications   Medication Sig Start Date End Date Taking? Authorizing Provider  acetaminophen (TYLENOL) 500 MG tablet Take 500 mg by mouth every 6 (six) hours as needed.    [provider]  albuterol (PROVENTIL) (2.5 MG/3ML) 0.083% nebulizer solution Take 2.5 mg by  nebulization every 4 (four) hours as needed for wheezing or shortness of breath.    [provider]  albuterol (VENTOLIN HFA) 108 (90 Base) MCG/ACT inhaler Inhale 2 puffs into the lungs every 6 (six) hours as needed for wheezing or shortness of breath.    [provider]  aspirin EC 81 MG tablet Take 81 mg by mouth daily.    [provider]  atorvastatin (LIPITOR) 80 MG tablet Take 80 mg by mouth at bedtime.    [provider]  DULoxetine (CYMBALTA) 30 MG capsule Take 30 mg by mouth 2 (two) times daily.    [provider]  ferrous sulfate 325 (65 FE) MG tablet Take 325 mg by mouth daily.    [provider]  Fluticasone-Salmeterol (ADVAIR) 250-50 MCG/DOSE AEPB Inhale 1 puff into the lungs 2 (two) times daily.    [provider]  furosemide (LASIX) 80 MG tablet Take 80 mg by mouth 2 (two) times daily.    [provider]  hydrOXYzine (ATARAX/VISTARIL) 25 MG tablet Take 25 mg by mouth every 6 (six) hours as needed (allergy symptoms).    [provider]  isosorbide mononitrate (IMDUR) 30 MG 24 hr tablet Take 1 tablet (30 mg total) by mouth daily. 03/11/20 04/10/20  Sharen Hones, MD  ketotifen (ZADITOR) 0.025 % ophthalmic solution Place 1 drop into both eyes 2 (two) times daily as needed.     [provider]  lactulose (CHRONULAC) 10 GM/15ML solution Take 30 g by mouth every 12 (twelve) hours as needed for mild constipation.    [provider]  lamoTRIgine (LAMICTAL) 25 MG tablet Take 25 mg by mouth 2 (two) times daily.    [provider]  lidocaine (LIDODERM) 5 % Cut 1 patch in half and apply to amputated stump once daily 12 hours on, 12 hours off    [provider]  liver oil-zinc oxide (DESITIN) 40 % ointment Apply 1 application topically at bedtime as needed for irritation. To buttocks     [provider]  LORazepam (ATIVAN) 0.5 MG tablet Take 0.5 mg by mouth 2 (two) times daily as  needed for anxiety.    [provider]  Melatonin 3 MG TABS Take 1 tablet by mouth at bedtime.    [provider]  metFORMIN (GLUCOPHAGE) 500 MG tablet Take 500 mg by mouth 2 (two) times daily with a meal.     [provider]  montelukast (SINGULAIR) 10 MG tablet Take 10 mg by mouth at bedtime.    [provider]  Multiple Vitamins-Minerals (PRESERVISION AREDS 2) CAPS Take 1 capsule by mouth 2 (two) times daily.    [provider]  nicotine (NICODERM CQ - DOSED IN MG/24 HOURS) 14 mg/24hr patch Place 14 mg onto the skin daily.    [provider]  nitroGLYCERIN (NITROSTAT) 0.4 MG SL tablet Place 0.4 mg under the tongue every 5 (five) minutes as needed for chest pain.    [provider]  pantoprazole (PROTONIX) 40 MG tablet Take 40 mg by mouth daily.    [provider]  potassium chloride (MICRO-K) 10 MEQ CR capsule Take 2 capsules (20 mEq total) by mouth daily for 10 days. 03/11/20 03/21/20  Sharen Hones, MD  Rivaroxaban (XARELTO) 15 MG TABS tablet Take 15 mg by mouth daily.    [provider]  senna (SENOKOT) 8.6 MG tablet Take 2 tablets by mouth at bedtime.    [provider]  tamsulosin (FLOMAX) 0.4 MG CAPS capsule Take 0.4 mg by mouth daily.    [provider]  traZODone (DESYREL) 50 MG tablet Take 25 mg by mouth at bedtime as needed for sleep.    [provider]  triamcinolone cream (KENALOG) 0.1 % Apply 1 application topically 2 (two) times daily.    [provider]    Review of Systems    Doing well since discharge.  He denies chest pain, palpitations, dyspnea, pnd, orthopnea, n, v, dizziness, syncope, edema, weight gain, or early satiety.  All other systems reviewed and are otherwise negative except as noted above.  Physical Exam    VS:  BP 100/64 (BP Location: Left Arm, Patient Position: Sitting, Cuff Size: Normal)   Pulse 93   Ht 6\' 2"  (1.88 m)   Wt 139 lb (63 kg)    SpO2 93%   BMI 17.85 kg/m  , BMI Body mass index is 17.85 kg/m. GEN: Well nourished, well developed, in no acute distress. HEENT: normal. Neck: Supple, no JVD, carotid bruits, or masses. Cardiac: RRR, 2/6 systolic murmur at the upper sternal borders with more blowing quality 2/6 murmur at the apex.  No rubs, or gallops. No clubbing, cyanosis, edema.  Radials 2+ and equal bilaterally. DP/PT 1+ on right.  Left AKA. Respiratory:  Respirations regular and unlabored, bibasilar crackles that improves with deep breathing. GI: Soft, nontender, nondistended, BS + x 4. MS: no deformity or atrophy. Skin: warm and dry, no rash. Neuro:  Strength and sensation are intact. Psych: Normal affect.  Accessory Clinical Findings    ECG personally reviewed by me today -regular sinus rhythm, 93, PVC, nonspecific T changes - no acute changes.  Lab Results  Component Value Date   WBC 10.8 (H) 03/11/2020   HGB 12.0 (L) 03/11/2020   HCT 35.8 (L) 03/11/2020   MCV 82.3 03/11/2020   PLT 192 03/11/2020   Lab Results  Component Value Date   CREATININE 1.42 (H) 03/11/2020   BUN 20 03/11/2020   NA 140 03/11/2020   K 3.0 (L) 03/11/2020   CL 107 03/11/2020   CO2 22 03/11/2020   Lab Results  Component Value Date   CHOL 143 01/27/2019   HDL 51 01/27/2019   LDLCALC 62 01/27/2019   TRIG 152 (H) 01/27/2019   CHOLHDL 2.8 01/27/2019     Assessment & Plan    1.  Coronary artery disease/chronic chest pain: Status post CABG in 1994 in [place] with abnormal Myoview in 2020, though patient preferred medical therapy.  Patient has chronic chest pain with recent admission in May with atypical/pleuritic chest pain and minimal troponin elevation (peak 28) with flat trend.  Pain was not felt to be cardiac in origin and he was subsequently discharged.  He has had no recurrence of chest pain.  I note that echocardiogram was ordered during admission but not carried out.  I will arrange for this to be performed as an  outpatient.  He otherwise remains on aspirin,  statin, nitrate therapy.  2.  Ischemic cardiomyopathy/HFrEF: EF 40-45% by echo in April 2020.  Euvolemic on examination and asymptomatic at home.  Blood pressure to soft for beta-blocker, ACE inhibitor/ARB/ARNI/MRA.  Follow-up echo planned as above.  3.  Aortic valve disease: Status post prior AVR at the time of his CABG.  Systolic murmur is noted.  Echo pending.  4.  Essential hypertension: Blood pressure actually soft.    5.  Hyperlipidemia: LDL of 62 last year.  Will need outpatient follow-up.  He is not fasting today.  He remains on statin therapy.  6.  Stage III chronic kidney disease: Creatinine 1.42 on May 21.  Follow-up in the setting of ongoing diuretic therapy.  7.  Hypokalemia: Potassium was 3.0 on May 21, when he was discharged.  Follow-up basic metabolic panel today.  He is on supplemental potassium.  8.  Paroxysmal atrial fibrillation: Maintaining sinus rhythm.  He is on Xarelto 15 mg daily.  9.  Type 2 diabetes mellitus: On Metformin therapy and followed by primary care.  10.  Disposition: Follow-up basic metabolic panel today and arrange for echocardiogram.  Follow-up in clinic in 3 months or sooner if necessary.   Murray Hodgkins, NP 03/30/2020, 11:22 AM

## 2020-03-31 ENCOUNTER — Telehealth: Payer: Self-pay

## 2020-03-31 DIAGNOSIS — I5022 Chronic systolic (congestive) heart failure: Secondary | ICD-10-CM

## 2020-03-31 LAB — BASIC METABOLIC PANEL
BUN/Creatinine Ratio: 11 (ref 10–24)
BUN: 17 mg/dL (ref 8–27)
CO2: 18 mmol/L — ABNORMAL LOW (ref 20–29)
Calcium: 8.5 mg/dL — ABNORMAL LOW (ref 8.6–10.2)
Chloride: 104 mmol/L (ref 96–106)
Creatinine, Ser: 1.5 mg/dL — ABNORMAL HIGH (ref 0.76–1.27)
GFR calc Af Amer: 52 mL/min/{1.73_m2} — ABNORMAL LOW (ref >59)
GFR calc non Af Amer: 45 mL/min/{1.73_m2} — ABNORMAL LOW (ref >59)
Glucose: 122 mg/dL — ABNORMAL HIGH (ref 65–99)
Potassium: 3.5 mmol/L (ref 3.5–5.2)
Sodium: 144 mmol/L (ref 134–144)

## 2020-03-31 MED ORDER — POTASSIUM CHLORIDE CRYS ER 20 MEQ PO TBCR
20.0000 meq | EXTENDED_RELEASE_TABLET | Freq: Every day | ORAL | 3 refills | Status: DC
Start: 2020-03-31 — End: 2020-04-20

## 2020-03-31 NOTE — Telephone Encounter (Signed)
Call to patient to review labs. Spoek with Claiborne Billings, RN at [facility].   She verbalized understanding and requested orders faxed to center [phone]).    Advised pt to call for any further questions or concerns.  Orders placed as advised.

## 2020-03-31 NOTE — Telephone Encounter (Signed)
-----   Message from Theora Gianotti, NP sent at 03/31/2020  7:39 AM EDT ----- Kidney function relatively stable.  Potassium on the low end of normal.  It looks like he was Rx 10 days worth of KCl @ hospital d/c in May.  Pls reRx Kdur 34meq daily.  F/u bmet in 1 month.

## 2020-04-08 ENCOUNTER — Other Ambulatory Visit
Admission: RE | Admit: 2020-04-08 | Discharge: 2020-04-08 | Disposition: A | Discharge status: Home / Self Care | Payer: Medicare Other | Attending: Nurse Practitioner | Admitting: Nurse Practitioner

## 2020-04-08 ENCOUNTER — Telehealth: Payer: Self-pay

## 2020-04-08 DIAGNOSIS — I5022 Chronic systolic (congestive) heart failure: Secondary | ICD-10-CM

## 2020-04-08 LAB — BASIC METABOLIC PANEL
Anion gap: 11 (ref 5–15)
BUN: 21 mg/dL (ref 8–23)
CO2: 23 mmol/L (ref 22–32)
Calcium: 7.6 mg/dL — ABNORMAL LOW (ref 8.9–10.3)
Chloride: 107 mmol/L (ref 98–111)
Creatinine, Ser: 1.61 mg/dL — ABNORMAL HIGH (ref 0.61–1.24)
GFR calc Af Amer: 48 mL/min — ABNORMAL LOW (ref >60)
GFR calc non Af Amer: 41 mL/min — ABNORMAL LOW (ref >60)
Glucose, Bld: 142 mg/dL — ABNORMAL HIGH (ref 70–99)
Potassium: 3.4 mmol/L — ABNORMAL LOW (ref 3.5–5.1)
Sodium: 141 mmol/L (ref 135–145)

## 2020-04-08 NOTE — Telephone Encounter (Signed)
-----   Message from Theora Gianotti, NP sent at 04/08/2020 12:01 PM EDT ----- Kidney function slightly worse over the past month.  Pls reduce lasix from 80 bid to 80 in the am and 40 in the pm.  Potassium remains low.  Pls increase kdur to 73meq daily.  Calcium is chronically low, but currently lower than prior trend @ 7.6.  He should f/u w/ primary care re: hypocalcemia.  F/u bmet in 1 wk.

## 2020-04-08 NOTE — Telephone Encounter (Signed)
Attempted to call patient @ [facility]. LMTCB 04/08/2020   Orders will need to be faxed (need to obtain fax number)

## 2020-04-20 MED ORDER — POTASSIUM CHLORIDE CRYS ER 20 MEQ PO TBCR
40.0000 meq | EXTENDED_RELEASE_TABLET | Freq: Every day | ORAL | 3 refills | Status: DC
Start: 2020-04-20 — End: 2020-06-20

## 2020-04-20 MED ORDER — FUROSEMIDE 80 MG PO TABS: ORAL_TABLET | ORAL | Status: DC

## 2020-04-20 NOTE — Telephone Encounter (Signed)
Called and spoke to nurse. Faxed orders and telephone note.

## 2020-04-21 ENCOUNTER — Other Ambulatory Visit: Payer: Self-pay

## 2020-04-21 MED ORDER — FUROSEMIDE 80 MG PO TABS: ORAL_TABLET | ORAL | 3 refills | Status: DC

## 2020-05-05 ENCOUNTER — Ambulatory Visit (INDEPENDENT_AMBULATORY_CARE_PROVIDER_SITE_OTHER): Payer: Medicare Other

## 2020-05-05 ENCOUNTER — Other Ambulatory Visit: Payer: Self-pay

## 2020-05-05 DIAGNOSIS — I5022 Chronic systolic (congestive) heart failure: Secondary | ICD-10-CM

## 2020-05-05 LAB — ECHOCARDIOGRAM COMPLETE
AV Mean grad: 10 mmHg
AV Peak grad: 20.7 mmHg
Ao pk vel: 2.28 m/s
Area-P 1/2: 2.21 cm2
Calc EF: 20 %
S' Lateral: 5.2 cm
Single Plane A2C EF: 27.6 %
Single Plane A4C EF: 11.2 %

## 2020-05-06 ENCOUNTER — Telehealth: Payer: Self-pay

## 2020-05-06 NOTE — Telephone Encounter (Signed)
Attempted to call patient. [facility] 05/06/2020

## 2020-05-06 NOTE — Telephone Encounter (Signed)
-----   Message from Theora Gianotti, NP sent at 05/06/2020  8:01 AM EDT ----- Heart squeezing function is more reduced than on evaluation in 2020.  EF now 25-30%, whereas he was previously 40-45% (normal 55-60%).  Bioprosthetic aortic valve is working appropriately.  With recent admission for c/p and mild troponin elevation, and new worsening of heart pumping function, I'd like him to come back in for a visit sooner than previously planned (currently has appt in Sept) to discuss echo, symptoms, adjust medications if feasible, and determine need for additional testing - ideally with Dr. Fletcher Anon.

## 2020-05-10 NOTE — Telephone Encounter (Signed)
Call attempted. Nurse not available to take call.   Letter sent via fax.  May 10, 2020   [facility] [place], [place] [address]    Attn: Leverne Humbles   c/o Mr. Gregoire Bennis,   ?   This letter is to inform you to call the office for multiple missed calls to discuss results. We have made several attempts to reach you by phone @ [phone] and have not received a return call.      Attached are your echo results:   ? ----- Message from Theora Gianotti, NP sent at 05/06/2020  8:01 AM EDT -----   Heart squeezing function is more reduced than on evaluation in 2020.  EF now 25-30%, whereas he was previously 40-45% (normal 55-60%).  Bioprosthetic aortic valve is working appropriately.  With recent admission for c/p and mild troponin elevation, and new worsening of heart pumping function, I'd like him to come back in for a visit sooner than previously planned (currently has appt in Sept) to discuss echo, symptoms, adjust medications if feasible, and determine need for additional testing - ideally with Dr. Fletcher Anon.   Please return call to clinic so we can discuss your plan of care. If you would like to update your account with any new numbers you can do so at that time.    ?   Thank you for allowing [place] to care for you.   Sincerely,   ?   Jolita Haefner RN

## 2020-05-11 NOTE — Telephone Encounter (Signed)
Incoming call received from [place], the [employer], patient care coordinator.   We reviewed results and made follow up appt with Dr. Fletcher Anon for next available appt 8/5.   She verbalized understanding and had no further questions.   She denied any current patient complaints. No SOB or chest pain noted.   Advised to call for any further questions or concerns.

## 2020-05-20 ENCOUNTER — Ambulatory Visit: Payer: Medicare Other | Admitting: Physician Assistant

## 2020-05-26 ENCOUNTER — Ambulatory Visit: Payer: Medicare Other | Admitting: Cardiovascular Disease

## 2020-05-27 ENCOUNTER — Ambulatory Visit (INDEPENDENT_AMBULATORY_CARE_PROVIDER_SITE_OTHER): Payer: Medicare Other | Admitting: Family

## 2020-05-27 ENCOUNTER — Encounter: Payer: Self-pay | Admitting: Family

## 2020-05-27 ENCOUNTER — Other Ambulatory Visit: Payer: Self-pay

## 2020-05-27 VITALS — BP 120/80 | HR 69 | Ht 72.0 in | Wt 140.0 lb

## 2020-05-27 DIAGNOSIS — I5020 Unspecified systolic (congestive) heart failure: Secondary | ICD-10-CM | POA: Diagnosis not present

## 2020-05-27 DIAGNOSIS — I25118 Atherosclerotic heart disease of native coronary artery with other forms of angina pectoris: Secondary | ICD-10-CM

## 2020-05-27 DIAGNOSIS — Z952 Presence of prosthetic heart valve: Secondary | ICD-10-CM

## 2020-05-27 DIAGNOSIS — E785 Hyperlipidemia, unspecified: Secondary | ICD-10-CM

## 2020-05-27 DIAGNOSIS — I255 Ischemic cardiomyopathy: Secondary | ICD-10-CM | POA: Diagnosis not present

## 2020-05-27 DIAGNOSIS — I5022 Chronic systolic (congestive) heart failure: Secondary | ICD-10-CM | POA: Diagnosis not present

## 2020-05-27 MED ORDER — ENTRESTO 24-26 MG PO TABS: 1.0000 | ORAL_TABLET | Freq: Two times a day (BID) | ORAL | 6 refills | Status: DC

## 2020-05-27 NOTE — Progress Notes (Signed)
Office Visit    Patient Name: Shawn Jennings Date of Encounter: 05/27/2020  Primary Care Provider:  [facility] Primary Cardiologist:  [name], MD Electrophysiologist:  None   Chief Complaint    Shawn Jennings is a 74 y.o. male with a hx of CAD s/p CABG in 1994, chronic chest pain, HTN, HLD, paroxysmal atrial relation, ischemic cardiomyopathy, HFrEF, ASA s/p AVR, PAD s/p left AKA, DM2, GERD, depression presents today for follow-up after echocardiogram  Past Medical History    Past Medical History:  Diagnosis Date  . Aortic stenosis    a. Pt unaware of history but CT imaging 01/2019 consistent w/ AVR; b. 01/2019 Echo: Mild to mod AS. Mean grad 42mmHg.  Marland Kitchen CAD (coronary artery disease)    a. 1994 s/p CABG ([place] [facility]); b. Reports multiple stress tests over the years w/o repeat cath; c. 01/2019 MV: large, sev, fixed inf and inflat defect extending to apex. No ischemia. EF 37%-->Med rx given atypical Ss and pt wishes.  . CKD (chronic kidney disease), stage III   . COPD (chronic obstructive pulmonary disease) ([place])   . Depression   . Essential hypertension   . GERD (gastroesophageal reflux disease)   . Hyperlipidemia LDL goal <70   . Ischemic cardiomyopathy    a. 01/2019 Echo: EF 40-45%, impaired relaxation. Mildly dil LA. Sev mitral annular Ca2+. Mild to mod AS.  Marland Kitchen PAD (peripheral artery disease) ([place])    a. 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  Marland Kitchen PAF (paroxysmal atrial fibrillation) ([facility])    a. CHA2DS2VASc = 4-->Xarelto 15mg  daily in setting of CKD.  Marland Kitchen Thyrotoxicosis   . Tobacco abuse   . Type II diabetes mellitus ([place])    Past Surgical History:  Procedure Laterality Date  . CORONARY ARTERY BYPASS GRAFT     a. [place], [place]  . Left AKA      Allergies  No Known Allergies  History of Present Illness    Iverson Sees is a 74 y.o. male with a hx of CAD s/p CABG in 1994, chronic chest pain, HTN, HLD, paroxysmal atrial relation, ischemic cardiomyopathy, HFrEF, ASA s/p  AVR, PAD s/p left AKA, DM2, GERD, depression last seen 03/30/2020 by [name], NP.  CAD s/p remote CABG in [place] in 1994.  Long history of chest pain with multiple stress tests over the years.  Admitted to [facility] April 2020 with chest pain.  Stress testing potentially high risk with large severe fixed inferior and inferolateral defect extends to the apex.  No ischemia noted.  EF 37%.  Echo with EF 40 to 45%.  He preferred to avoid cardiac catheterization and he had been medically managed since.  He was admitted May 2021 with recurrent atypical chest pain that was worse with deep breathing.  Troponins were normal.  He was seen by our team with recommendation for follow-up echo which was not performed.  He underwent barium esophagram that showed small hiatal hernia.  He was discharged home Mar 11, 2020.  When seen in follow-up 03/30/20 he was recommended for echocardiogram gram had not been performed while hospitalized.  Echo 05/05/2020 showed newly reduced LVEF 25 to 30%, LV with global hypokinesis, mild LVH, grade 1 diastolic dysfunction, RV normal size and function, bioprosthetic aortic valve with normal function, borderline dilation of aortic root measuring 38 mm.  Presents today for follow-up.  Reports no recurrent chest pain, pressure, tightness.  Reports previous chest pain while hospitalized felt like chest pressure. Reports intermittent  swelling in his hands.  No lower extremity edema no abdominal edema.  Denies shortness of breath, orthopnea, PND.  We reviewed his echocardiogram in detail and the indication for cardiac catheterization.  Risks and benefits of cardiac catheterization were discussed.  EKGs/Labs/Other Studies Reviewed:   The following studies were reviewed today: Echo 05/05/2020  1. Left ventricular ejection fraction, by estimation, is 25 to 30%. The  left ventricle has severely decreased function. The left ventricle  demonstrates global hypokinesis. There is mild left  ventricular  hypertrophy. Left ventricular diastolic parameters   are consistent with Grade I diastolic dysfunction (impaired relaxation).   2. Right ventricular systolic function is normal. The right ventricular  size is normal.   3. The mitral valve is degenerative. No evidence of mitral valve  regurgitation.   4. The aortic valve has been repaired/replaced. Aortic valve  regurgitation is not visualized. There is a a bioprosthetic valve present  in the aortic position. Echo findings are consistent with normal structure  and function of the aortic valve  prosthesis.   5. Aortic dilatation noted. There is borderline dilatation of the aortic  root measuring 38 mm.    EKG:  EKG is  ordered today.  The ekg ordered today demonstrates sinus and 69 bpm with no acute ST/T wave changes.  Occasional PVC.  Recent Labs: 03/11/2020: Hemoglobin 12.0; Magnesium 2.1; Platelets 192 04/08/2020: BUN 21; Creatinine, Ser 1.61; Potassium 3.4; Sodium 141  Recent Lipid Panel    Component Value Date/Time   CHOL 143 01/27/2019 0237   TRIG 152 (H) 01/27/2019 0237   HDL 51 01/27/2019 0237   CHOLHDL 2.8 01/27/2019 0237   VLDL 30 01/27/2019 0237   LDLCALC 62 01/27/2019 0237    Home Medications   Current Meds  Medication Sig  . acetaminophen (TYLENOL) 500 MG tablet Take 500 mg by mouth every 6 (six) hours as needed.  Marland Kitchen albuterol (PROVENTIL) (2.5 MG/3ML) 0.083% nebulizer solution Take 2.5 mg by nebulization every 4 (four) hours as needed for wheezing or shortness of breath.  Marland Kitchen albuterol (VENTOLIN HFA) 108 (90 Base) MCG/ACT inhaler Inhale 2 puffs into the lungs every 6 (six) hours as needed for wheezing or shortness of breath.  Marland Kitchen aspirin EC 81 MG tablet Take 81 mg by mouth daily.  Marland Kitchen atorvastatin (LIPITOR) 80 MG tablet Take 80 mg by mouth at bedtime.  . DULoxetine (CYMBALTA) 30 MG capsule Take 30 mg by mouth 2 (two) times daily.  . ferrous sulfate 325 (65 FE) MG tablet Take 325 mg by mouth daily.  .  Fluticasone-Salmeterol (ADVAIR) 250-50 MCG/DOSE AEPB Inhale 1 puff into the lungs 2 (two) times daily.  . furosemide (LASIX) 80 MG tablet Take 1 tablet (80 mg total) in the morning and 0.5 tablet (40 mg total) in the afternoon  . hydrOXYzine (ATARAX/VISTARIL) 25 MG tablet Take 25 mg by mouth every 6 (six) hours as needed (allergy symptoms).  . isosorbide mononitrate (IMDUR) 30 MG 24 hr tablet Take 1 tablet (30 mg total) by mouth daily.  Marland Kitchen ketotifen (ZADITOR) 0.025 % ophthalmic solution Place 1 drop into both eyes 2 (two) times daily as needed.   . lactulose (CHRONULAC) 10 GM/15ML solution Take 30 g by mouth every 12 (twelve) hours as needed for mild constipation.  Marland Kitchen lamoTRIgine (LAMICTAL) 25 MG tablet Take 25 mg by mouth 2 (two) times daily.  Marland Kitchen lidocaine (LIDODERM) 5 % Cut 1 patch in half and apply to amputated stump once daily 12 hours on, 12 hours off  .  liver oil-zinc oxide (DESITIN) 40 % ointment Apply 1 application topically at bedtime as needed for irritation. To buttocks   . LORazepam (ATIVAN) 0.5 MG tablet Take 0.5 mg by mouth 2 (two) times daily as needed for anxiety.  . Melatonin 3 MG TABS Take 1 tablet by mouth at bedtime.  . metFORMIN (GLUCOPHAGE) 500 MG tablet Take 500 mg by mouth 2 (two) times daily with a meal.   . montelukast (SINGULAIR) 10 MG tablet Take 10 mg by mouth at bedtime.  . Multiple Vitamins-Minerals (PRESERVISION AREDS 2) CAPS Take 1 capsule by mouth 2 (two) times daily.  . nicotine (NICODERM CQ - DOSED IN MG/24 HOURS) 14 mg/24hr patch Place 14 mg onto the skin daily.  . nitroGLYCERIN (NITROSTAT) 0.4 MG SL tablet Place 0.4 mg under the tongue every 5 (five) minutes as needed for chest pain.  . pantoprazole (PROTONIX) 40 MG tablet Take 40 mg by mouth daily.  . potassium chloride SA (KLOR-CON) 20 MEQ tablet Take 2 tablets (40 mEq total) by mouth daily.  . Rivaroxaban (XARELTO) 15 MG TABS tablet Take 15 mg by mouth daily.  Marland Kitchen senna (SENOKOT) 8.6 MG tablet Take 2 tablets by  mouth at bedtime.  . tamsulosin (FLOMAX) 0.4 MG CAPS capsule Take 0.4 mg by mouth daily.  . traZODone (DESYREL) 50 MG tablet Take 25 mg by mouth at bedtime as needed for sleep.  Marland Kitchen triamcinolone cream (KENALOG) 0.1 % Apply 1 application topically 2 (two) times daily.      Review of Systems   Review of Systems  Constitutional: Negative for chills, fever and malaise/fatigue.  Cardiovascular: Negative for chest pain, dyspnea on exertion, irregular heartbeat, leg swelling, near-syncope, orthopnea, palpitations and syncope.  Respiratory: Negative for cough, shortness of breath and wheezing.   Gastrointestinal: Negative for melena, nausea and vomiting.  Genitourinary: Negative for hematuria.  Neurological: Negative for dizziness, light-headedness and weakness.   All other systems reviewed and are otherwise negative except as noted above.  Physical Exam    VS:  BP 120/80 (BP Location: Left Arm, Patient Position: Sitting, Cuff Size: Normal)   Pulse 69   Ht 6' (1.829 m)   Wt 140 lb (63.5 kg)   SpO2 93%   BMI 18.99 kg/m  , BMI Body mass index is 18.99 kg/m. GEN: Thin, well developed, in no acute distress. HEENT: normal. Neck: Supple, no JVD, carotid bruits, or masses. Cardiac: RRR, no murmurs, rubs, or gallops. No clubbing, cyanosis, edema.  Radials 2+ and equal bilaterally. RLE PT 1+ Respiratory:  Respirations regular and unlabored, clear to auscultation bilaterally. GI: Soft, nontender, nondistended. MS: No deformity or atrophy. S/p L AKA. Skin: Warm and dry, no rash. Neuro:  Strength and sensation are intact. Psych: Normal affect.  Assessment & Plan    1. CAD/chronic chest pain -s/p CABG in 1994 and [place] with abnormal Myoview 11/2018 that he preferred medical therapy at that time.  Most recent admission May 2021 with atypical/pleuritic chest pain and minimal troponin elevation with peak of 20 and flat trend.  Due to newly reduced LVEF by echo 05/05/2020 plan for right and  left cardiac catheterization.  Present GDMT includes aspirin, statin, nitrate therapy.  2. ICM/HFrEF -01/2019 EF 40-45%.  Echo last month with EF worsened to 25-30%.  Euvolemic on exam and asymptomatic.  Due to newly reduced LVEF plan for right and left heart cath. The patient understands that risks include but are not limited to stroke (1 in 1000), death (1 in 17),  kidney failure [usually temporary] (1 in 500), bleeding (1 in 200), allergic reaction [possibly serious] (1 in 200), and agrees to proceed. Start Entresto 24-26mg  twice daily. Additional GDMT includes Lasix. May be able to reduce Lasix dose in future with addition of Entresto. BMP in 1 week  3. S/p AVR -systolic murmur noted.  Echo 05/05/2020 with bioprosthetic valve functioning appropriately.  4. HLD, LDL goal <70 -continue high intensity statin.  5. CKD III -careful titration of diuretics and antihypertensive agents.  6. PAF -maintaining normal sinus rhythm.  Continue Xarelto 50 mg daily.  7. DM2 -continue to follow with primary care.  If renal function continues to decline may need to consider discontinuation of Metformin.  Disposition: Cardiac catheterization 06/06/20. Follow up in 3 week(s) with Dr. Fletcher Anon or APP.   Loel Dubonnet, NP 05/27/2020, 11:19 AM

## 2020-05-27 NOTE — H&P (View-Only) (Signed)
Office Visit    Patient Name: Shawn Jennings Date of Encounter: 05/27/2020  Primary Care Provider:  [facility] Primary Cardiologist:  [name], MD Electrophysiologist:  None   Chief Complaint    Shawn Jennings is a 74 y.o. male with a hx of CAD s/p CABG in 1994, chronic chest pain, HTN, HLD, paroxysmal atrial relation, ischemic cardiomyopathy, HFrEF, ASA s/p AVR, PAD s/p left AKA, DM2, GERD, depression presents today for follow-up after echocardiogram  Past Medical History    Past Medical History:  Diagnosis Date  . Aortic stenosis    a. Pt unaware of history but CT imaging 01/2019 consistent w/ AVR; b. 01/2019 Echo: Mild to mod AS. Mean grad 97mmHg.  Marland Kitchen CAD (coronary artery disease)    a. 1994 s/p CABG ([place]); b. Reports multiple stress tests over the years w/o repeat cath; c. 01/2019 MV: large, sev, fixed inf and inflat defect extending to apex. No ischemia. EF 37%-->Med rx given atypical Ss and pt wishes.  . CKD (chronic kidney disease), stage III   . COPD (chronic obstructive pulmonary disease) ([place])   . Depression   . Essential hypertension   . GERD (gastroesophageal reflux disease)   . Hyperlipidemia LDL goal <70   . Ischemic cardiomyopathy    a. 01/2019 Echo: EF 40-45%, impaired relaxation. Mildly dil LA. Sev mitral annular Ca2+. Mild to mod AS.  Marland Kitchen PAD (peripheral artery disease) ([place])    a. 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  Marland Kitchen PAF (paroxysmal atrial fibrillation) ([facility])    a. CHA2DS2VASc = 4-->Xarelto 15mg  daily in setting of CKD.  Marland Kitchen Thyrotoxicosis   . Tobacco abuse   . Type II diabetes mellitus ([place])    Past Surgical History:  Procedure Laterality Date  . CORONARY ARTERY BYPASS GRAFT     a. [place], [place]  . Left AKA      Allergies  No Known Allergies  History of Present Illness    Shawn Jennings is a 74 y.o. male with a hx of CAD s/p CABG in 1994, chronic chest pain, HTN, HLD, paroxysmal atrial relation, ischemic cardiomyopathy, HFrEF, ASA s/p  AVR, PAD s/p left AKA, DM2, GERD, depression last seen 03/30/2020 by [name], NP.  CAD s/p remote CABG in [place] in 1994.  Long history of chest pain with multiple stress tests over the years.  Admitted to [facility] April 2020 with chest pain.  Stress testing potentially high risk with large severe fixed inferior and inferolateral defect extends to the apex.  No ischemia noted.  EF 37%.  Echo with EF 40 to 45%.  He preferred to avoid cardiac catheterization and he had been medically managed since.  He was admitted May 2021 with recurrent atypical chest pain that was worse with deep breathing.  Troponins were normal.  He was seen by our team with recommendation for follow-up echo which was not performed.  He underwent barium esophagram that showed small hiatal hernia.  He was discharged home Mar 11, 2020.  When seen in follow-up 03/30/20 he was recommended for echocardiogram gram had not been performed while hospitalized.  Echo 05/05/2020 showed newly reduced LVEF 25 to 30%, LV with global hypokinesis, mild LVH, grade 1 diastolic dysfunction, RV normal size and function, bioprosthetic aortic valve with normal function, borderline dilation of aortic root measuring 38 mm.  Presents today for follow-up.  Reports no recurrent chest pain, pressure, tightness.  Reports previous chest pain while hospitalized felt like chest pressure. Reports intermittent  swelling in his hands.  No lower extremity edema no abdominal edema.  Denies shortness of breath, orthopnea, PND.  We reviewed his echocardiogram in detail and the indication for cardiac catheterization.  Risks and benefits of cardiac catheterization were discussed.  EKGs/Labs/Other Studies Reviewed:   The following studies were reviewed today: Echo 05/05/2020  1. Left ventricular ejection fraction, by estimation, is 25 to 30%. The  left ventricle has severely decreased function. The left ventricle  demonstrates global hypokinesis. There is mild left  ventricular  hypertrophy. Left ventricular diastolic parameters   are consistent with Grade I diastolic dysfunction (impaired relaxation).   2. Right ventricular systolic function is normal. The right ventricular  size is normal.   3. The mitral valve is degenerative. No evidence of mitral valve  regurgitation.   4. The aortic valve has been repaired/replaced. Aortic valve  regurgitation is not visualized. There is a a bioprosthetic valve present  in the aortic position. Echo findings are consistent with normal structure  and function of the aortic valve  prosthesis.   5. Aortic dilatation noted. There is borderline dilatation of the aortic  root measuring 38 mm.    EKG:  EKG is  ordered today.  The ekg ordered today demonstrates sinus and 69 bpm with no acute ST/T wave changes.  Occasional PVC.  Recent Labs: 03/11/2020: Hemoglobin 12.0; Magnesium 2.1; Platelets 192 04/08/2020: BUN 21; Creatinine, Ser 1.61; Potassium 3.4; Sodium 141  Recent Lipid Panel    Component Value Date/Time   CHOL 143 01/27/2019 0237   TRIG 152 (H) 01/27/2019 0237   HDL 51 01/27/2019 0237   CHOLHDL 2.8 01/27/2019 0237   VLDL 30 01/27/2019 0237   LDLCALC 62 01/27/2019 0237    Home Medications   Current Meds  Medication Sig  . acetaminophen (TYLENOL) 500 MG tablet Take 500 mg by mouth every 6 (six) hours as needed.  Marland Kitchen albuterol (PROVENTIL) (2.5 MG/3ML) 0.083% nebulizer solution Take 2.5 mg by nebulization every 4 (four) hours as needed for wheezing or shortness of breath.  Marland Kitchen albuterol (VENTOLIN HFA) 108 (90 Base) MCG/ACT inhaler Inhale 2 puffs into the lungs every 6 (six) hours as needed for wheezing or shortness of breath.  Marland Kitchen aspirin EC 81 MG tablet Take 81 mg by mouth daily.  Marland Kitchen atorvastatin (LIPITOR) 80 MG tablet Take 80 mg by mouth at bedtime.  . DULoxetine (CYMBALTA) 30 MG capsule Take 30 mg by mouth 2 (two) times daily.  . ferrous sulfate 325 (65 FE) MG tablet Take 325 mg by mouth daily.  .  Fluticasone-Salmeterol (ADVAIR) 250-50 MCG/DOSE AEPB Inhale 1 puff into the lungs 2 (two) times daily.  . furosemide (LASIX) 80 MG tablet Take 1 tablet (80 mg total) in the morning and 0.5 tablet (40 mg total) in the afternoon  . hydrOXYzine (ATARAX/VISTARIL) 25 MG tablet Take 25 mg by mouth every 6 (six) hours as needed (allergy symptoms).  . isosorbide mononitrate (IMDUR) 30 MG 24 hr tablet Take 1 tablet (30 mg total) by mouth daily.  Marland Kitchen ketotifen (ZADITOR) 0.025 % ophthalmic solution Place 1 drop into both eyes 2 (two) times daily as needed.   . lactulose (CHRONULAC) 10 GM/15ML solution Take 30 g by mouth every 12 (twelve) hours as needed for mild constipation.  Marland Kitchen lamoTRIgine (LAMICTAL) 25 MG tablet Take 25 mg by mouth 2 (two) times daily.  Marland Kitchen lidocaine (LIDODERM) 5 % Cut 1 patch in half and apply to amputated stump once daily 12 hours on, 12 hours off  .  liver oil-zinc oxide (DESITIN) 40 % ointment Apply 1 application topically at bedtime as needed for irritation. To buttocks   . LORazepam (ATIVAN) 0.5 MG tablet Take 0.5 mg by mouth 2 (two) times daily as needed for anxiety.  . Melatonin 3 MG TABS Take 1 tablet by mouth at bedtime.  . metFORMIN (GLUCOPHAGE) 500 MG tablet Take 500 mg by mouth 2 (two) times daily with a meal.   . montelukast (SINGULAIR) 10 MG tablet Take 10 mg by mouth at bedtime.  . Multiple Vitamins-Minerals (PRESERVISION AREDS 2) CAPS Take 1 capsule by mouth 2 (two) times daily.  . nicotine (NICODERM CQ - DOSED IN MG/24 HOURS) 14 mg/24hr patch Place 14 mg onto the skin daily.  . nitroGLYCERIN (NITROSTAT) 0.4 MG SL tablet Place 0.4 mg under the tongue every 5 (five) minutes as needed for chest pain.  . pantoprazole (PROTONIX) 40 MG tablet Take 40 mg by mouth daily.  . potassium chloride SA (KLOR-CON) 20 MEQ tablet Take 2 tablets (40 mEq total) by mouth daily.  . Rivaroxaban (XARELTO) 15 MG TABS tablet Take 15 mg by mouth daily.  Marland Kitchen senna (SENOKOT) 8.6 MG tablet Take 2 tablets by  mouth at bedtime.  . tamsulosin (FLOMAX) 0.4 MG CAPS capsule Take 0.4 mg by mouth daily.  . traZODone (DESYREL) 50 MG tablet Take 25 mg by mouth at bedtime as needed for sleep.  Marland Kitchen triamcinolone cream (KENALOG) 0.1 % Apply 1 application topically 2 (two) times daily.      Review of Systems   Review of Systems  Constitutional: Negative for chills, fever and malaise/fatigue.  Cardiovascular: Negative for chest pain, dyspnea on exertion, irregular heartbeat, leg swelling, near-syncope, orthopnea, palpitations and syncope.  Respiratory: Negative for cough, shortness of breath and wheezing.   Gastrointestinal: Negative for melena, nausea and vomiting.  Genitourinary: Negative for hematuria.  Neurological: Negative for dizziness, light-headedness and weakness.   All other systems reviewed and are otherwise negative except as noted above.  Physical Exam    VS:  BP 120/80 (BP Location: Left Arm, Patient Position: Sitting, Cuff Size: Normal)   Pulse 69   Ht 6' (1.829 m)   Wt 140 lb (63.5 kg)   SpO2 93%   BMI 18.99 kg/m  , BMI Body mass index is 18.99 kg/m. GEN: Thin, well developed, in no acute distress. HEENT: normal. Neck: Supple, no JVD, carotid bruits, or masses. Cardiac: RRR, no murmurs, rubs, or gallops. No clubbing, cyanosis, edema.  Radials 2+ and equal bilaterally. RLE PT 1+ Respiratory:  Respirations regular and unlabored, clear to auscultation bilaterally. GI: Soft, nontender, nondistended. MS: No deformity or atrophy. S/p L AKA. Skin: Warm and dry, no rash. Neuro:  Strength and sensation are intact. Psych: Normal affect.  Assessment & Plan    1. CAD/chronic chest pain -s/p CABG in 1994 and [place] with abnormal Myoview 11/2018 that he preferred medical therapy at that time.  Most recent admission May 2021 with atypical/pleuritic chest pain and minimal troponin elevation with peak of 20 and flat trend.  Due to newly reduced LVEF by echo 05/05/2020 plan for right and  left cardiac catheterization.  Present GDMT includes aspirin, statin, nitrate therapy.  2. ICM/HFrEF -01/2019 EF 40-45%.  Echo last month with EF worsened to 25-30%.  Euvolemic on exam and asymptomatic.  Due to newly reduced LVEF plan for right and left heart cath. The patient understands that risks include but are not limited to stroke (1 in 1000), death (1 in 3),  kidney failure [usually temporary] (1 in 500), bleeding (1 in 200), allergic reaction [possibly serious] (1 in 200), and agrees to proceed. Start Entresto 24-26mg  twice daily. Additional GDMT includes Lasix. May be able to reduce Lasix dose in future with addition of Entresto. BMP in 1 week  3. S/p AVR -systolic murmur noted.  Echo 05/05/2020 with bioprosthetic valve functioning appropriately.  4. HLD, LDL goal <70 -continue high intensity statin.  5. CKD III -careful titration of diuretics and antihypertensive agents.  6. PAF -maintaining normal sinus rhythm.  Continue Xarelto 50 mg daily.  7. DM2 -continue to follow with primary care.  If renal function continues to decline may need to consider discontinuation of Metformin.  Disposition: Cardiac catheterization 06/06/20. Follow up in 3 week(s) with Dr. Fletcher Anon or APP.   Loel Dubonnet, NP 05/27/2020, 11:19 AM

## 2020-05-27 NOTE — Patient Instructions (Addendum)
Medication Instructions:  1- START Entresto Take 1 tablet by mouth 2 (two) times daily 2- TAKE a baby aspirin starting sat aug 14th (while holding xarelto). Take 1 tablet (81 mg total) once daily while holding xarelto *If you need a refill on your cardiac medications before your next appointment, please call your pharmacy*   Lab Work:  1- You will need to have blood drawn on Friday, August 13 at [facility]. You do not need to be fasting. No appt is needed. Hours are M-F 7AM- 6 PM.  2- CV19 Pre admit testing DRIVE THRU  Please report to the PAT testing site (medical arts [facility]) on ______Friday, August 13___ date ______between 8-12 AM_____ time for your DRIVE THRU covid testing that is required prior to your procedure.  Following covid testing, please remain in quarantine. If you must be around others, please wash hands, avoid touching face and wear your mask.   If you have labs (blood work) drawn today and your tests are completely normal, you will receive your results only by:  [place] (if you have MyChart) OR  A paper copy in the mail If you have any lab test that is abnormal or we need to change your treatment, we will call you to review the results.   Testing/Procedures:    [address], [place] [address] Dept: [phone] Loc: [place]  05/27/2020  You are scheduled for a Cardiac Catheterization on Monday, August 16 with Dr. [name].  1. Please arrive at the [place] [facility] @ _____ (we will call and confirm time) Special note: Every effort is made to have your procedure done on time. Please understand that emergencies sometimes delay scheduled procedures.  2. Diet: Do not eat solid foods after midnight.  The patient may have clear liquids until 5am upon the day of the procedure.  3. Labs: You will need to have  blood drawn on Friday, August 13 at [facility]. You do not need to be fasting.  4. Medication instructions in preparation for your procedure: Hold entresto starting: Sunday Aug 15th  Stop taking Xarelto (Rivaroxaban) on Saturday, August 14.  Stop taking, Lasix (Furosemide)  Monday, August 16,   Do not take Diabetes Med Glucophage (Metformin) on the day of the procedure and HOLD [place].  On the morning of your procedure, take your Aspirin and any morning medicines NOT listed above.  You may use sips of water.  5. Plan for one night stay--bring personal belongings. 6. Bring a current list of your medications and current insurance cards. 7. You MUST have a responsible person to drive you home. 8. Someone MUST be with you the first 24 hours after you arrive home or your discharge will be delayed. 9. Please wear clothes that are easy to get on and off and wear slip-on shoes.  Thank you for allowing [place] to care for you!   -- [facility]    Follow-Up: At [facility], you and your health needs are our priority.  As part of our continuing mission to provide you with exceptional heart care, we have created designated Provider Care Teams.  These Care Teams include your primary Cardiologist (physician) and Advanced Practice Providers (APPs -  Physician Assistants and Nurse Practitioners) who all work together to provide you with the care you need, when you need it.  We  recommend signing up for the patient portal called "MyChart".  Sign up information is provided on this After Visit Summary.  MyChart is used to connect with patients for Virtual Visits (Telemedicine).  Patients are able to view lab/test results, encounter notes, upcoming appointments, etc.  Non-urgent messages can be sent to your provider as well.   To learn more about what you can do with MyChart, go to [email or web address].    Your next appointment:  3 weeks

## 2020-06-02 ENCOUNTER — Other Ambulatory Visit: Admission: RE | Admit: 2020-06-02 | Payer: Medicare Other | Source: Ambulatory Visit

## 2020-06-03 ENCOUNTER — Telehealth: Payer: Self-pay | Admitting: *Deleted

## 2020-06-03 ENCOUNTER — Other Ambulatory Visit
Admission: RE | Admit: 2020-06-03 | Discharge: 2020-06-03 | Disposition: A | Discharge status: Home / Self Care | Payer: Medicare Other | Source: Ambulatory Visit | Attending: Cardiovascular Disease | Admitting: Cardiovascular Disease

## 2020-06-03 ENCOUNTER — Other Ambulatory Visit: Payer: Self-pay

## 2020-06-03 DIAGNOSIS — Z01812 Encounter for preprocedural laboratory examination: Secondary | ICD-10-CM | POA: Diagnosis present

## 2020-06-03 DIAGNOSIS — Z20822 Contact with and (suspected) exposure to covid-19: Secondary | ICD-10-CM | POA: Insufficient documentation

## 2020-06-03 NOTE — Telephone Encounter (Signed)
-----   Message from Loel Dubonnet, NP sent at 06/03/2020  8:57 AM EDT ----- Shawn [facility] has [facility] scheduled for Monday with Dr. Fletcher Anon  Note from Dr. Fletcher Anon that he will need 4 hours of gentle hydration prior to procedure (I will place fluid orders with cath orders). Per Dr. Fletcher Anon make case at 10:30 and have him arrive at 0630 for IVF - we may need to update cath lab with this timing.  Also note from cath lab RN to confirm he has COVID test scheduled. I think we told him to get one (resides at facility) but unsure how to check that in the chart. Can we please confirm?  Thanks! Loel Dubonnet, NP

## 2020-06-03 NOTE — Telephone Encounter (Signed)
First number attempted by dialing x2. No answer and VM has not been set up.   [place] and spoke with Alida, the nurse caring for patient. States she was unaware of the need for Covid test but had the other information regarding meds to hold for patient procedure on Monday.  Transportation is able to bring patient over in about 15 minutes if pre-admit testing is able to perform Covid test.  Had [place], [place] run upstairs to ask if they can perform Covid test at this time of day. Nurse said that was fine and to have them call the number on the door and she would come out to test the patient.   Solmon Ice is aware to have patient at [facility] at 6:30 am on 06/06/20.  She will call me back to confirm after speaking with transportation staff.  Patient should be on his way for the COvid test.  I still need to confirm his arrival time with Alida.

## 2020-06-03 NOTE — Telephone Encounter (Signed)
Received incoming call from [place] at [facility]. She was able to arrange for patient to be here at 6:30 am on Monday, 06/06/20 for his procedure. I expressed my appreciation for all her help.  Called and changed procedure time to 10:30 am.

## 2020-06-04 LAB — SARS CORONAVIRUS 2 (TAT 6-24 HRS): SARS Coronavirus 2: NEGATIVE

## 2020-06-05 NOTE — Addendum Note (Signed)
Addended by: Loel Dubonnet on: 06/05/2020 12:34 PM   Modules accepted: Orders, SmartSet

## 2020-06-06 ENCOUNTER — Encounter: Payer: Self-pay | Admitting: Cardiovascular Disease

## 2020-06-06 ENCOUNTER — Encounter
Admission: RE | Disposition: A | Discharge status: Home / Self Care | Payer: Self-pay | Source: Home / Self Care | Attending: Cardiovascular Disease

## 2020-06-06 ENCOUNTER — Other Ambulatory Visit: Payer: Self-pay

## 2020-06-06 ENCOUNTER — Ambulatory Visit
Admission: RE | Admit: 2020-06-06 | Discharge: 2020-06-06 | Disposition: A | Discharge status: Home / Self Care | Payer: Medicare Other | Attending: Cardiovascular Disease | Admitting: Cardiovascular Disease

## 2020-06-06 DIAGNOSIS — K219 Gastro-esophageal reflux disease without esophagitis: Secondary | ICD-10-CM | POA: Insufficient documentation

## 2020-06-06 DIAGNOSIS — E1122 Type 2 diabetes mellitus with diabetic chronic kidney disease: Secondary | ICD-10-CM | POA: Diagnosis not present

## 2020-06-06 DIAGNOSIS — Z952 Presence of prosthetic heart valve: Secondary | ICD-10-CM | POA: Diagnosis not present

## 2020-06-06 DIAGNOSIS — I25119 Atherosclerotic heart disease of native coronary artery with unspecified angina pectoris: Secondary | ICD-10-CM | POA: Insufficient documentation

## 2020-06-06 DIAGNOSIS — R079 Chest pain, unspecified: Secondary | ICD-10-CM

## 2020-06-06 DIAGNOSIS — E785 Hyperlipidemia, unspecified: Secondary | ICD-10-CM | POA: Insufficient documentation

## 2020-06-06 DIAGNOSIS — Z7982 Long term (current) use of aspirin: Secondary | ICD-10-CM | POA: Insufficient documentation

## 2020-06-06 DIAGNOSIS — Z7951 Long term (current) use of inhaled steroids: Secondary | ICD-10-CM | POA: Insufficient documentation

## 2020-06-06 DIAGNOSIS — Z79899 Other long term (current) drug therapy: Secondary | ICD-10-CM | POA: Insufficient documentation

## 2020-06-06 DIAGNOSIS — Z7984 Long term (current) use of oral hypoglycemic drugs: Secondary | ICD-10-CM | POA: Insufficient documentation

## 2020-06-06 DIAGNOSIS — I5022 Chronic systolic (congestive) heart failure: Secondary | ICD-10-CM | POA: Diagnosis not present

## 2020-06-06 DIAGNOSIS — I25118 Atherosclerotic heart disease of native coronary artery with other forms of angina pectoris: Secondary | ICD-10-CM | POA: Diagnosis not present

## 2020-06-06 DIAGNOSIS — F329 Major depressive disorder, single episode, unspecified: Secondary | ICD-10-CM | POA: Diagnosis not present

## 2020-06-06 DIAGNOSIS — Z951 Presence of aortocoronary bypass graft: Secondary | ICD-10-CM | POA: Diagnosis not present

## 2020-06-06 DIAGNOSIS — J449 Chronic obstructive pulmonary disease, unspecified: Secondary | ICD-10-CM | POA: Insufficient documentation

## 2020-06-06 DIAGNOSIS — I130 Hypertensive heart and chronic kidney disease with heart failure and stage 1 through stage 4 chronic kidney disease, or unspecified chronic kidney disease: Secondary | ICD-10-CM | POA: Diagnosis not present

## 2020-06-06 DIAGNOSIS — I480 Paroxysmal atrial fibrillation: Secondary | ICD-10-CM | POA: Insufficient documentation

## 2020-06-06 DIAGNOSIS — Z7901 Long term (current) use of anticoagulants: Secondary | ICD-10-CM | POA: Diagnosis not present

## 2020-06-06 DIAGNOSIS — I2582 Chronic total occlusion of coronary artery: Secondary | ICD-10-CM | POA: Diagnosis not present

## 2020-06-06 DIAGNOSIS — I272 Pulmonary hypertension, unspecified: Secondary | ICD-10-CM | POA: Insufficient documentation

## 2020-06-06 DIAGNOSIS — I255 Ischemic cardiomyopathy: Secondary | ICD-10-CM | POA: Insufficient documentation

## 2020-06-06 DIAGNOSIS — G8929 Other chronic pain: Secondary | ICD-10-CM | POA: Insufficient documentation

## 2020-06-06 DIAGNOSIS — I350 Nonrheumatic aortic (valve) stenosis: Secondary | ICD-10-CM | POA: Diagnosis not present

## 2020-06-06 DIAGNOSIS — I2510 Atherosclerotic heart disease of native coronary artery without angina pectoris: Secondary | ICD-10-CM | POA: Diagnosis not present

## 2020-06-06 DIAGNOSIS — Z89612 Acquired absence of left leg above knee: Secondary | ICD-10-CM | POA: Diagnosis not present

## 2020-06-06 HISTORY — PX: RIGHT/LEFT HEART CATH AND CORONARY ANGIOGRAPHY: CATH118266

## 2020-06-06 LAB — CBC
HCT: 39.5 % (ref 39.0–52.0)
Hemoglobin: 13.2 g/dL (ref 13.0–17.0)
MCH: 27.6 pg (ref 26.0–34.0)
MCHC: 33.4 g/dL (ref 30.0–36.0)
MCV: 82.6 fL (ref 80.0–100.0)
Platelets: 210 10*3/uL (ref 150–400)
RBC: 4.78 MIL/uL (ref 4.22–5.81)
RDW: 15 % (ref 11.5–15.5)
WBC: 10.4 10*3/uL (ref 4.0–10.5)
nRBC: 0 % (ref 0.0–0.2)

## 2020-06-06 LAB — BASIC METABOLIC PANEL
Anion gap: 14 (ref 5–15)
BUN: 19 mg/dL (ref 8–23)
CO2: 21 mmol/L — ABNORMAL LOW (ref 22–32)
Calcium: 8.5 mg/dL — ABNORMAL LOW (ref 8.9–10.3)
Chloride: 104 mmol/L (ref 98–111)
Creatinine, Ser: 1.47 mg/dL — ABNORMAL HIGH (ref 0.61–1.24)
GFR calc Af Amer: 53 mL/min — ABNORMAL LOW (ref >60)
GFR calc non Af Amer: 46 mL/min — ABNORMAL LOW (ref >60)
Glucose, Bld: 125 mg/dL — ABNORMAL HIGH (ref 70–99)
Potassium: 3.9 mmol/L (ref 3.5–5.1)
Sodium: 139 mmol/L (ref 135–145)

## 2020-06-06 SURGERY — RIGHT/LEFT HEART CATH AND CORONARY ANGIOGRAPHY
Anesthesia: Moderate Sedation

## 2020-06-06 MED ORDER — SODIUM CHLORIDE 0.9% FLUSH: 3.0000 mL | INTRAVENOUS | Status: DC | PRN

## 2020-06-06 MED ORDER — MIDAZOLAM HCL 2 MG/2ML IJ SOLN
INTRAMUSCULAR | Status: DC | PRN
  Administered 2020-06-06: 1 mg via INTRAVENOUS

## 2020-06-06 MED ORDER — FENTANYL CITRATE (PF) 100 MCG/2ML IJ SOLN
INTRAMUSCULAR | Status: DC | PRN
  Administered 2020-06-06: 25 ug via INTRAVENOUS

## 2020-06-06 MED ORDER — SODIUM CHLORIDE 0.9 % IV SOLN: 250.0000 mL | INTRAVENOUS | Status: DC | PRN

## 2020-06-06 MED ORDER — SODIUM CHLORIDE 0.9 % IV SOLN: INTRAVENOUS | Status: DC

## 2020-06-06 MED ORDER — HEPARIN (PORCINE) IN NACL 1000-0.9 UT/500ML-% IV SOLN
INTRAVENOUS | Status: AC
  Filled 2020-06-06: qty 1000

## 2020-06-06 MED ORDER — LIDOCAINE HCL (PF) 1 % IJ SOLN
INTRAMUSCULAR | Status: DC | PRN
  Administered 2020-06-06: 30 mL

## 2020-06-06 MED ORDER — SODIUM CHLORIDE 0.9 % IV SOLN
INTRAVENOUS | Status: AC | PRN
  Administered 2020-06-06: 75 mL/h via INTRAVENOUS

## 2020-06-06 MED ORDER — FENTANYL CITRATE (PF) 100 MCG/2ML IJ SOLN
INTRAMUSCULAR | Status: AC
  Filled 2020-06-06: qty 2

## 2020-06-06 MED ORDER — SODIUM CHLORIDE 0.9% FLUSH: 3.0000 mL | Freq: Two times a day (BID) | INTRAVENOUS | Status: DC

## 2020-06-06 MED ORDER — MIDAZOLAM HCL 2 MG/2ML IJ SOLN
INTRAMUSCULAR | Status: AC
  Filled 2020-06-06: qty 2

## 2020-06-06 MED ORDER — ACETAMINOPHEN 325 MG PO TABS: 650.0000 mg | ORAL_TABLET | ORAL | Status: DC | PRN

## 2020-06-06 MED ORDER — ASPIRIN 81 MG PO CHEW: 81.0000 mg | CHEWABLE_TABLET | ORAL | Status: AC

## 2020-06-06 MED ORDER — LIDOCAINE HCL (PF) 1 % IJ SOLN
INTRAMUSCULAR | Status: AC
  Filled 2020-06-06: qty 30

## 2020-06-06 MED ORDER — ONDANSETRON HCL 4 MG/2ML IJ SOLN: 4.0000 mg | Freq: Four times a day (QID) | INTRAMUSCULAR | Status: DC | PRN

## 2020-06-06 MED ORDER — IOHEXOL 300 MG/ML  SOLN
INTRAMUSCULAR | Status: DC | PRN
  Administered 2020-06-06: 120 mL

## 2020-06-06 MED ORDER — ASPIRIN 81 MG PO CHEW
CHEWABLE_TABLET | ORAL | Status: AC
  Administered 2020-06-06: 81 mg
  Filled 2020-06-06: qty 1

## 2020-06-06 MED ORDER — HEPARIN (PORCINE) IN NACL 1000-0.9 UT/500ML-% IV SOLN
INTRAVENOUS | Status: DC | PRN
  Administered 2020-06-06: 500 mL

## 2020-06-06 SURGICAL SUPPLY — 14 items
CANNULA 5F STIFF (CANNULA) ×3 IMPLANT
CATH INFINITI 5 FR IM (CATHETERS) ×3 IMPLANT
CATH INFINITI 5 FR MPA2 (CATHETERS) ×3 IMPLANT
CATH INFINITI 5FR JL4 (CATHETERS) ×3 IMPLANT
CATH INFINITI JR4 5F (CATHETERS) ×3 IMPLANT
CATH SWANZ 7F THERMO (CATHETERS) ×3 IMPLANT
DEVICE CLOSURE MYNXGRIP 5F (Vascular Products) ×3 IMPLANT
KIT MANI 3VAL PERCEP (MISCELLANEOUS) ×3 IMPLANT
PACK CARDIAC CATH (CUSTOM PROCEDURE TRAY) ×3 IMPLANT
SHEATH AVANTI 5FR X 11CM (SHEATH) ×3 IMPLANT
SHEATH AVANTI 7FRX11 (SHEATH) ×3 IMPLANT
WIRE EMERALD 3MM-J .035X260CM (WIRE) ×3 IMPLANT
WIRE GUIDERIGHT .035X150 (WIRE) ×3 IMPLANT
WIRE HITORQ VERSACORE ST 145CM (WIRE) ×3 IMPLANT

## 2020-06-06 NOTE — Interval H&P Note (Signed)
History and Physical Interval Note:  06/06/2020 11:37 AM  Shawn [facility]  has presented today for surgery, with the diagnosis of LT and RT Cath   Chest pain.  The various methods of treatment have been discussed with the patient and family. After consideration of risks, benefits and other options for treatment, the patient has consented to  Procedure(s): RIGHT/LEFT HEART CATH AND CORONARY ANGIOGRAPHY (N/A) as a surgical intervention.  The patient's history has been reviewed, patient examined, no change in status, stable for surgery.  I have reviewed the patient's chart and labs.  Questions were answered to the patient's satisfaction.     [name]

## 2020-06-06 NOTE — Discharge Instructions (Signed)
Remember to hold pressure to your right groin over the dressing whenever you cough, laugh or sneeze for the next 24 hours.   Femoral Site Care This sheet gives you information about how to care for yourself after your procedure. Your health care provider may also give you more specific instructions. If you have problems or questions, contact your health care provider. What can I expect after the procedure? After the procedure, it is common to have:  Bruising that usually fades within 1-2 weeks.  Tenderness at the site. Follow these instructions at home: Wound care  Follow instructions from your health care provider about how to take care of your insertion site. Make sure you: ? Wash your hands with soap and water before you change your bandage (dressing). If soap and water are not available, use hand sanitizer. ? Change your dressing as told by your health care provider. ? Leave stitches (sutures), skin glue, or adhesive strips in place. These skin closures may need to stay in place for 2 weeks or longer. If adhesive strip edges start to loosen and curl up, you may trim the loose edges. Do not remove adhesive strips completely unless your health care provider tells you to do that.  Do not take baths, swim, or use a hot tub until your health care provider approves.  You may shower 24-48 hours after the procedure or as told by your health care provider. ? Gently wash the site with plain soap and water. ? Pat the area dry with a clean towel. ? Do not rub the site. This may cause bleeding.  Do not apply powder or lotion to the site. Keep the site clean and dry.  Check your femoral site every day for signs of infection. Check for: ? Redness, swelling, or pain. ? Fluid or blood. ? Warmth. ? Pus or a bad smell. Activity  For the first 2-3 days after your procedure, or as long as directed: ? Avoid climbing stairs as much as possible. ? Do not squat.  Do not lift anything that is  heavier than 10 lb (4.5 kg), or the limit that you are told, until your health care provider says that it is safe.  Rest as directed. ? Avoid sitting for a long time without moving. Get up to take short walks every 1-2 hours.  Do not drive for 24 hours if you were given a medicine to help you relax (sedative). General instructions  Take over-the-counter and prescription medicines only as told by your health care provider.  Keep all follow-up visits as told by your health care provider. This is important. Contact a health care provider if you have:  A fever or chills.  You have redness, swelling, or pain around your insertion site. Get help right away if:  The catheter insertion area swells very fast.  You pass out.  You suddenly start to sweat or your skin gets clammy.  The catheter insertion area is bleeding, and the bleeding does not stop when you hold steady pressure on the area.  The area near or just beyond the catheter insertion site becomes pale, cool, tingly, or numb. These symptoms may represent a serious problem that is an emergency. Do not wait to see if the symptoms will go away. Get medical help right away. Call your local emergency services (911 in the [place] Do not drive yourself to the hospital. Summary  After the procedure, it is common to have bruising that usually fades within 1-2 weeks.  Check  your femoral site every day for signs of infection.  Do not lift anything that is heavier than 10 lb (4.5 kg), or the limit that you are told, until your health care provider says that it is safe. This information is not intended to replace advice given to you by your health care provider. Make sure you discuss any questions you have with your health care provider. Document Revised: 10/21/2017 Document Reviewed: 10/21/2017 Elsevier Patient Education  [place]. Moderate Conscious Sedation, Adult, Care After These instructions provide you with information about  caring for yourself after your procedure. Your health care provider may also give you more specific instructions. Your treatment has been planned according to current medical practices, but problems sometimes occur. Call your health care provider if you have any problems or questions after your procedure. What can I expect after the procedure? After your procedure, it is common:  To feel sleepy for several hours.  To feel clumsy and have poor balance for several hours.  To have poor judgment for several hours.  To vomit if you eat too soon. Follow these instructions at home: For at least 24 hours after the procedure:   Do not: ? Participate in activities where you could fall or become injured. ? Drive. ? Use heavy machinery. ? Drink alcohol. ? Take sleeping pills or medicines that cause drowsiness. ? Make important decisions or sign legal documents. ? Take care of children on your own.  Rest. Eating and drinking  Follow the diet recommended by your health care provider.  If you vomit: ? Drink water, juice, or soup when you can drink without vomiting. ? Make sure you have little or no nausea before eating solid foods. General instructions  Have a responsible adult stay with you until you are awake and alert.  Take over-the-counter and prescription medicines only as told by your health care provider.  If you smoke, do not smoke without supervision.  Keep all follow-up visits as told by your health care provider. This is important. Contact a health care provider if:  You keep feeling nauseous or you keep vomiting.  You feel light-headed.  You develop a rash.  You have a fever. Get help right away if:  You have trouble breathing. This information is not intended to replace advice given to you by your health care provider. Make sure you discuss any questions you have with your health care provider. Document Revised: 09/20/2017 Document Reviewed: 01/28/2016 Elsevier  Patient Education  [place] After This sheet gives you information about how to care for yourself after your procedure. Your health care provider may also give you more specific instructions. If you have problems or questions, contact your health care provider. What can I expect after the procedure? After the procedure, it is common to have bruising and tenderness at the catheter insertion area. Follow these instructions at home: Insertion site care  Follow instructions from your health care provider about how to take care of your insertion site. Make sure you: ? Wash your hands with soap and water before you change your bandage (dressing). If soap and water are not available, use hand sanitizer. ? Change your dressing as told by your health care provider. ? Leave stitches (sutures), skin glue, or adhesive strips in place. These skin closures may need to stay in place for 2 weeks or longer. If adhesive strip edges start to loosen and curl up, you may trim the loose edges. Do not remove adhesive  strips completely unless your health care provider tells you to do that.  Do not take baths, swim, or use a hot tub until your health care provider approves.  You may shower 24-48 hours after the procedure or as told by your health care provider. ? Gently wash the site with plain soap and water. ? Pat the area dry with a clean towel. ? Do not rub the site. This may cause bleeding.  Do not apply powder or lotion to the site. Keep the site clean and dry.  Check your insertion site every day for signs of infection. Check for: ? Redness, swelling, or pain. ? Fluid or blood. ? Warmth. ? Pus or a bad smell. Activity  Rest as told by your health care provider, usually for 1-2 days.  Do not lift anything that is heavier than 10 lbs. (4.5 kg) or as told by your health care provider.  Do not drive for 24 hours if you were given a medicine to help you relax (sedative).  Do  not drive or use heavy machinery while taking prescription pain medicine. General instructions   Return to your normal activities as told by your health care provider, usually in about a week. Ask your health care provider what activities are safe for you.  If the catheter site starts bleeding, lie flat and put pressure on the site. If the bleeding does not stop, get help right away. This is a medical emergency.  Drink enough fluid to keep your urine clear or pale yellow. This helps flush the contrast dye from your body.  Take over-the-counter and prescription medicines only as told by your health care provider.  Keep all follow-up visits as told by your health care provider. This is important. Contact a health care provider if:  You have a fever or chills.  You have redness, swelling, or pain around your insertion site.  You have fluid or blood coming from your insertion site.  The insertion site feels warm to the touch.  You have pus or a bad smell coming from your insertion site.  You have bruising around the insertion site.  You notice blood collecting in the tissue around the catheter site (hematoma). The hematoma may be painful to the touch. Get help right away if:  You have severe pain at the catheter insertion area.  The catheter insertion area swells very fast.  The catheter insertion area is bleeding, and the bleeding does not stop when you hold steady pressure on the area.  The area near or just beyond the catheter insertion site becomes pale, cool, tingly, or numb. These symptoms may represent a serious problem that is an emergency. Do not wait to see if the symptoms will go away. Get medical help right away. Call your local emergency services (911 in the [place] Do not drive yourself to the hospital. Summary  After the procedure, it is common to have bruising and tenderness at the catheter insertion area.  After the procedure, it is important to rest and drink  plenty of fluids.  Do not take baths, swim, or use a hot tub until your health care provider says it is okay to do so. You may shower 24-48 hours after the procedure or as told by your health care provider.  If the catheter site starts bleeding, lie flat and put pressure on the site. If the bleeding does not stop, get help right away. This is a medical emergency. This information is not intended to replace advice  given to you by your health care provider. Make sure you discuss any questions you have with your health care provider. Document Revised: 09/20/2017 Document Reviewed: 09/12/2016 Elsevier Patient Education  2020 [place]

## 2020-06-06 NOTE — Progress Notes (Signed)
Pt continues to recover from cardiac cath. Pt had a BM in bed. Pt cleaned and new chux applied. Pt wearing a condom cath and has urine output. Pt is holding right groin with coughing. Pt is now cleared to sit up. Pt given a soda to drink and is tolerating fluids.

## 2020-06-06 NOTE — Progress Notes (Signed)
[facility] care contacted about discharge time of 1500 for transport. Staff advised this RN they would contact me when they figure something out.

## 2020-06-06 NOTE — OR Nursing (Signed)
[facility] called to review MAR to find out when last dose of medications, had to leave call back number. Medication list sent but last dose not noted. Pt reports he received no medicatons today. But he said he takes too many medications to know what he takes.

## 2020-06-17 ENCOUNTER — Encounter: Payer: Self-pay | Admitting: Family

## 2020-06-17 ENCOUNTER — Ambulatory Visit (INDEPENDENT_AMBULATORY_CARE_PROVIDER_SITE_OTHER): Payer: Medicare Other | Admitting: Family

## 2020-06-17 ENCOUNTER — Other Ambulatory Visit: Payer: Self-pay

## 2020-06-17 VITALS — BP 120/74 | HR 72 | Ht 72.0 in | Wt 140.0 lb

## 2020-06-17 DIAGNOSIS — I38 Endocarditis, valve unspecified: Secondary | ICD-10-CM

## 2020-06-17 DIAGNOSIS — R079 Chest pain, unspecified: Secondary | ICD-10-CM

## 2020-06-17 DIAGNOSIS — Z7901 Long term (current) use of anticoagulants: Secondary | ICD-10-CM | POA: Diagnosis not present

## 2020-06-17 DIAGNOSIS — I480 Paroxysmal atrial fibrillation: Secondary | ICD-10-CM | POA: Diagnosis not present

## 2020-06-17 DIAGNOSIS — E785 Hyperlipidemia, unspecified: Secondary | ICD-10-CM | POA: Diagnosis not present

## 2020-06-17 DIAGNOSIS — I255 Ischemic cardiomyopathy: Secondary | ICD-10-CM

## 2020-06-17 DIAGNOSIS — I5020 Unspecified systolic (congestive) heart failure: Secondary | ICD-10-CM

## 2020-06-17 DIAGNOSIS — I25118 Atherosclerotic heart disease of native coronary artery with other forms of angina pectoris: Secondary | ICD-10-CM

## 2020-06-17 DIAGNOSIS — Z952 Presence of prosthetic heart valve: Secondary | ICD-10-CM

## 2020-06-17 DIAGNOSIS — N1831 Chronic kidney disease, stage 3a: Secondary | ICD-10-CM

## 2020-06-17 MED ORDER — CARVEDILOL 3.125 MG PO TABS
3.1250 mg | ORAL_TABLET | Freq: Two times a day (BID) | ORAL | 3 refills | Status: DC
Start: 2020-06-17 — End: 2020-10-25

## 2020-06-17 NOTE — Progress Notes (Signed)
Office Visit    Patient Name: Shawn Jennings Date of Encounter: 06/17/2020  Primary Care Provider:  [facility] Primary Cardiologist:  [name], MD Electrophysiologist:  None   Chief Complaint    Shawn Jennings is a 74 y.o. male with a hx of CAD s/p CABG in 1994, chronic chest pain, HTN, HLD, paroxysmal atrial relation, ischemic cardiomyopathy, HFrEF, ASA s/p AVR, PAD s/p left AKA, DM2, GERD, depression presents today for follow-up after cardiac catheterization  Past Medical History    Past Medical History:  Diagnosis Date  . Aortic stenosis    a. Pt unaware of history but CT imaging 01/2019 consistent w/ AVR; b. 01/2019 Echo: Mild to mod AS. Mean grad 29mmHg.  Marland Kitchen CAD (coronary artery disease)    a. 1994 s/p CABG ([place]); b. Reports multiple stress tests over the years w/o repeat cath; c. 01/2019 MV: large, sev, fixed inf and inflat defect extending to apex. No ischemia. EF 37%-->Med rx given atypical Ss and pt wishes.  . CKD (chronic kidney disease), stage III   . COPD (chronic obstructive pulmonary disease) ([place])   . Depression   . Essential hypertension   . GERD (gastroesophageal reflux disease)   . Hyperlipidemia LDL goal <70   . Ischemic cardiomyopathy    a. 01/2019 Echo: EF 40-45%, impaired relaxation. Mildly dil LA. Sev mitral annular Ca2+. Mild to mod AS.  Marland Kitchen PAD (peripheral artery disease) ([place])    a. 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  Marland Kitchen PAF (paroxysmal atrial fibrillation) ([facility])    a. CHA2DS2VASc = 4-->Xarelto 15mg  daily in setting of CKD.  Marland Kitchen Thyrotoxicosis   . Tobacco abuse   . Type II diabetes mellitus ([place])    Past Surgical History:  Procedure Laterality Date  . CARDIAC CATHETERIZATION    . CHOLECYSTECTOMY    . CORONARY ARTERY BYPASS GRAFT     a. [place], [place]  . Left AKA    . RIGHT/LEFT HEART CATH AND CORONARY ANGIOGRAPHY N/A 06/06/2020   Procedure: RIGHT/LEFT HEART CATH AND CORONARY ANGIOGRAPHY;  Surgeon: Wellington Hampshire, MD;  Location: [facility];  Service: Cardiovascular;  Laterality: N/A;    Allergies  No Known Allergies  History of Present Illness    Shawn Jennings is a 74 y.o. male with a hx of CAD s/p CABG in 1994, chronic chest pain, HTN, HLD, paroxysmal atrial relation, ischemic cardiomyopathy, HFrEF, ASA s/p AVR, PAD s/p left AKA, DM2, GERD, depression last seen for cardiac cath 06/06/20.  CAD s/p remote CABG in [place] in 1994.  Long history of chest pain with multiple stress tests over the years.  Admitted to [facility] April 2020 with chest pain.  Stress testing potentially high risk with large severe fixed inferior and inferolateral defect extends to the apex.  No ischemia noted.  EF 37%.  Echo with EF 40 to 45%.  He preferred to avoid cardiac catheterization and he had been medically managed since.  He was admitted May 2021 with recurrent atypical chest pain that was worse with deep breathing.  Troponins were normal.  He was seen by our team with recommendation for follow-up echo which was not performed.  He underwent barium esophagram that showed small hiatal hernia.  He was discharged home Mar 11, 2020.  When seen in follow-up 03/30/20 he was recommended for echocardiogram as it had not been performed while hospitalized.  Echo 05/05/2020 showed newly reduced LVEF 25 to 30%, LV with global hypokinesis, mild LVH,  grade 1 diastolic dysfunction, RV normal size and function, bioprosthetic aortic valve with normal function, borderline dilation of aortic root measuring 38 mm.  Seen in clinic follow up 05/27/20 and recommended for [facility] due to newly reduced LVEF. Cardiac catheterization 06/06/20 with severe underlying coronary disease with patent grafts and RHC with normal filling  EKGs/Labs/Other Studies Reviewed:   The following studies were reviewed today:  Cardiac cath 06/06/20  Prox Cx to Mid Cx lesion is 100% stenosed.  Mid LAD lesion is 100% stenosed.  Prox RCA lesion is 100% stenosed.  LIMA graft was  visualized by non-selective angiography and is normal in caliber.  The graft exhibits no disease.  RIMA graft was visualized by non-selective angiography and is normal in caliber.  The graft exhibits no disease.   1.  Severe underlying three-vessel coronary artery disease with patent grafts including LIMA to LAD, SVG Y graft to second diagonal and OM 3 and RIMA to RCA. 2.  Left ventricular angiography was not performed.  EF was severely reduced by echo. 3.  Right heart catheterization showed normal filling pressures, minimal pulmonary hypertension and severely reduced cardiac output.   Recommendations: Continue medical therapy for chronic systolic heart failure. No need for any revascularization. Continue aggressive treatment of risk factors. Xarelto can be resumed tomorrow.  Echo 05/05/2020  1. Left ventricular ejection fraction, by estimation, is 25 to 30%. The  left ventricle has severely decreased function. The left ventricle  demonstrates global hypokinesis. There is mild left ventricular  hypertrophy. Left ventricular diastolic parameters   are consistent with Grade I diastolic dysfunction (impaired relaxation).   2. Right ventricular systolic function is normal. The right ventricular  size is normal.   3. The mitral valve is degenerative. No evidence of mitral valve  regurgitation.   4. The aortic valve has been repaired/replaced. Aortic valve  regurgitation is not visualized. There is a a bioprosthetic valve present  in the aortic position. Echo findings are consistent with normal structure  and function of the aortic valve  prosthesis.   5. Aortic dilatation noted. There is borderline dilatation of the aortic  root measuring 38 mm.    EKG:  EKG is  ordered today.  The ekg ordered today demonstrates SR 72 bpm with lateral TWI which are also present on EKG 03/30/20. He has short PR of 113ms. No acute ST/T wave changes.   Recent Labs: 03/11/2020: Magnesium 2.1 06/06/2020:  BUN 19; Creatinine, Ser 1.47; Hemoglobin 13.2; Platelets 210; Potassium 3.9; Sodium 139  Recent Lipid Panel    Component Value Date/Time   CHOL 143 01/27/2019 0237   TRIG 152 (H) 01/27/2019 0237   HDL 51 01/27/2019 0237   CHOLHDL 2.8 01/27/2019 0237   VLDL 30 01/27/2019 0237   LDLCALC 62 01/27/2019 0237    Home Medications   Current Meds  Medication Sig  . acetaminophen (TYLENOL) 500 MG tablet Take 500 mg by mouth every 6 (six) hours as needed.  Marland Kitchen albuterol (PROVENTIL) (2.5 MG/3ML) 0.083% nebulizer solution Take 2.5 mg by nebulization every 4 (four) hours as needed for wheezing or shortness of breath.  Marland Kitchen albuterol (VENTOLIN HFA) 108 (90 Base) MCG/ACT inhaler Inhale 2 puffs into the lungs every 6 (six) hours as needed for wheezing or shortness of breath.   Marland Kitchen aspirin EC 81 MG tablet Take 81 mg by mouth daily.  Marland Kitchen atorvastatin (LIPITOR) 80 MG tablet Take 80 mg by mouth at bedtime.  . DULoxetine (CYMBALTA) 30 MG capsule Take 30 mg by  mouth 2 (two) times daily.  . ferrous sulfate 325 (65 FE) MG tablet Take 325 mg by mouth daily.  . Fluticasone-Salmeterol (ADVAIR) 250-50 MCG/DOSE AEPB Inhale 1 puff into the lungs 2 (two) times daily.  . furosemide (LASIX) 80 MG tablet Take 1 tablet (80 mg total) in the morning and 0.5 tablet (40 mg total) in the afternoon  . hydrOXYzine (ATARAX/VISTARIL) 25 MG tablet Take 25 mg by mouth every 6 (six) hours as needed (allergy symptoms).  . isosorbide mononitrate (IMDUR) 30 MG 24 hr tablet Take 1 tablet (30 mg total) by mouth daily.  Marland Kitchen ketotifen (ZADITOR) 0.025 % ophthalmic solution Place 1 drop into both eyes 2 (two) times daily as needed.   . lactulose (CHRONULAC) 10 GM/15ML solution Take 30 g by mouth every 12 (twelve) hours as needed for mild constipation.  Marland Kitchen lamoTRIgine (LAMICTAL) 25 MG tablet Take 25 mg by mouth 2 (two) times daily.  Marland Kitchen lidocaine (LIDODERM) 5 % Cut 1 patch in half and apply to amputated stump once daily 12 hours on, 12 hours off  . liver  oil-zinc oxide (DESITIN) 40 % ointment Apply 1 application topically at bedtime as needed for irritation. To buttocks   . LORazepam (ATIVAN) 0.5 MG tablet Take 0.5 mg by mouth 2 (two) times daily as needed for anxiety.  . Melatonin 3 MG TABS Take 1 tablet by mouth at bedtime.  . metFORMIN (GLUCOPHAGE) 500 MG tablet Take 500 mg by mouth 2 (two) times daily with a meal.   . montelukast (SINGULAIR) 10 MG tablet Take 10 mg by mouth at bedtime.  . Multiple Vitamins-Minerals (PRESERVISION AREDS 2) CAPS Take 1 capsule by mouth 2 (two) times daily.  . nicotine (NICODERM CQ - DOSED IN MG/24 HOURS) 14 mg/24hr patch Place 14 mg onto the skin daily.  . nitroGLYCERIN (NITROSTAT) 0.4 MG SL tablet Place 0.4 mg under the tongue every 5 (five) minutes as needed for chest pain.  . pantoprazole (PROTONIX) 40 MG tablet Take 40 mg by mouth daily.  . potassium chloride SA (KLOR-CON) 20 MEQ tablet Take 2 tablets (40 mEq total) by mouth daily.  . Rivaroxaban (XARELTO) 15 MG TABS tablet Take 15 mg by mouth daily.  . sacubitril-valsartan (ENTRESTO) 24-26 MG Take 1 tablet by mouth 2 (two) times daily.  Marland Kitchen senna (SENOKOT) 8.6 MG tablet Take 2 tablets by mouth at bedtime.  . tamsulosin (FLOMAX) 0.4 MG CAPS capsule Take 0.4 mg by mouth daily.  . traZODone (DESYREL) 50 MG tablet Take 25 mg by mouth at bedtime as needed for sleep.  Marland Kitchen triamcinolone cream (KENALOG) 0.1 % Apply 1 application topically 2 (two) times daily.    Review of Systems  Review of Systems  Constitutional: Negative for chills, fever and malaise/fatigue.  Cardiovascular: Negative for chest pain, dyspnea on exertion, irregular heartbeat, leg swelling, near-syncope, orthopnea, palpitations and syncope.  Respiratory: Negative for cough, shortness of breath and wheezing.   Gastrointestinal: Negative for melena, nausea and vomiting.  Genitourinary: Negative for hematuria.  Neurological: Negative for dizziness, light-headedness and weakness.   All other systems  reviewed and are otherwise negative except as noted above.  Physical Exam    VS:  BP 120/74 (BP Location: Right Arm, Patient Position: Sitting, Cuff Size: Normal)   Pulse 72   Ht 6' (1.829 m)   Wt 140 lb (63.5 kg) Comment: Estimated by pt-in a wheelchair-L leg amputee above knee  SpO2 97%   BMI 18.99 kg/m  , BMI Body mass index is 18.99  kg/m. GEN: Thin, well developed, in no acute distress. HEENT: normal. Neck: Supple, no JVD, carotid bruits, or masses. Cardiac: RRR, no murmurs, rubs, or gallops. No clubbing, cyanosis, edema.  Radials 2+ and equal bilaterally. RLE PT 1+ Respiratory:  Respirations regular and unlabored, clear to auscultation bilaterally. GI: Soft, nontender, nondistended. MS: No deformity or atrophy. S/p L AKA. Skin: Warm and dry, no rash. R groin cath site clean, dry, intact with no ecchymosis, hematoma, nor signs of infection.  Neuro:  Strength and sensation are intact. Psych: Normal affect.  Assessment & Plan    1. CAD -s/p CABG in 1994 and [place] with abnormal Myoview 11/2018 that he preferred medical therapy at that time.  Most recent admission May 2021 with atypical/pleuritic chest pain and minimal troponin elevation with peak of 20 and flat trend.  No recurrent anginal symptoms. LHC 05/2020 with severe underlying coronary disease and patent grafts.  Present GDMT includes aspirin, statin, nitrate therapy. Will plan to start Coreg 3.125 mg BID due to reduced EF and for optimization of GDMT.   2. ICM/HFrEF -01/2019 EF 40-45%.  Echo last month with EF worsened to 25-30%.  L/RHC 05/2020 with severe underlying CAD with patent bypass grafts, severely depressed CO, normal LVEDP. Euvolemic on exam and asymptomatic. GDMT includes Entresto 24-26mg  BID, Lasix 80mg  AM and 40mg  PM. Start Coreg 3.125mg  BID. Careful monitoring of renal function and BP as he has been hypotensive in the past. Reports no lightheadedness, dizziness, near syncope. Future considerations include  addition of Farxiga vs MRA.  3. S/p AVR -Systolic murmur noted.  Echo 05/05/2020 with bioprosthetic valve functioning appropriately. Continue to monitor with periodic echo. No chest pain, shortness of breath, near syncope.   4. HLD, LDL goal <70 - 01/2019 LDL 62. Continue high intensity statin. Defer lipid panel today as he is not fastin.  5. CKD III -Careful titration of diuretics and antihypertensive agents. BMP today.  6. PAF -Maintaining normal sinus rhythm.  Continue Xarelto 50 mg daily.   7. DM2 -Continue to follow with primary care.  If renal function continues to decline may need to consider discontinuation of Metformin. Could consider addition of Dapagliflozin for HF benefit.   Disposition: Follow up in 4-5 weeks with Dr. Fletcher Anon or APP.   Loel Dubonnet, NP 06/17/2020, 12:30 PM

## 2020-06-17 NOTE — Patient Instructions (Addendum)
Medication Instructions:  Your physician has recommended you make the following change in your medication:   START Carvedilol (Coreg) 3.125 mg twice daily  *If you need a refill on your cardiac medications before your next appointment, please call your pharmacy*   Lab Work: Your physician recommends that you return for lab work today: BMP  If you have labs (blood work) drawn today and your tests are completely normal, you will receive your results only by: Marland Kitchen MyChart Message (if you have MyChart) OR . A paper copy in the mail If you have any lab test that is abnormal or we need to change your treatment, we will call you to review the results.   Testing/Procedures: Your EKG today was stable. Your cardiac catheterization shows that your grafts placed during open heart surgery are all working appropriately!   Follow-Up: At [facility], you and your health needs are our priority.  As part of our continuing mission to provide you with exceptional heart care, we have created designated Provider Care Teams.  These Care Teams include your primary Cardiologist (physician) and Advanced Practice Providers (APPs -  Physician Assistants and Nurse Practitioners) who all work together to provide you with the care you need, when you need it.  We recommend signing up for the patient portal called "MyChart".  Sign up information is provided on this After Visit Summary.  MyChart is used to connect with patients for Virtual Visits (Telemedicine).  Patients are able to view lab/test results, encounter notes, upcoming appointments, etc.  Non-urgent messages can be sent to your provider as well.   To learn more about what you can do with MyChart, go to [email or web address].    Your next appointment:   4-6 week(s)  The format for your next appointment:   In Person  Provider:     You may see [name], MD or Laurann Montana, NP

## 2020-06-18 LAB — BASIC METABOLIC PANEL
BUN/Creatinine Ratio: 10 (ref 10–24)
BUN: 18 mg/dL (ref 8–27)
CO2: 21 mmol/L (ref 20–29)
Calcium: 8.8 mg/dL (ref 8.6–10.2)
Chloride: 107 mmol/L — ABNORMAL HIGH (ref 96–106)
Creatinine, Ser: 1.72 mg/dL — ABNORMAL HIGH (ref 0.76–1.27)
GFR calc Af Amer: 44 mL/min/{1.73_m2} — ABNORMAL LOW (ref >59)
GFR calc non Af Amer: 38 mL/min/{1.73_m2} — ABNORMAL LOW (ref >59)
Glucose: 120 mg/dL — ABNORMAL HIGH (ref 65–99)
Potassium: 4.1 mmol/L (ref 3.5–5.2)
Sodium: 144 mmol/L (ref 134–144)

## 2020-06-20 ENCOUNTER — Telehealth: Payer: Self-pay

## 2020-06-20 MED ORDER — FUROSEMIDE 80 MG PO TABS: ORAL_TABLET | ORAL | 3 refills | Status: DC

## 2020-06-20 MED ORDER — POTASSIUM CHLORIDE CRYS ER 20 MEQ PO TBCR
20.0000 meq | EXTENDED_RELEASE_TABLET | Freq: Every day | ORAL | 3 refills | Status: DC
Start: 2020-06-20 — End: 2020-10-25

## 2020-06-20 NOTE — Telephone Encounter (Signed)
Call to [employer], spoke to [employer]. She verbalized understanding in POC. Requested orders be sent via email since fax machine is down.   Emailed as requested to address [email or web address]  Orders updated as requested.

## 2020-06-20 NOTE — Telephone Encounter (Signed)
-----   Message from Loel Dubonnet, NP sent at 06/18/2020 12:06 PM EDT ----- Renal function mild decline. Potassium normal. For protection of renal function, reduce Lasix to 80mg  AM only (discontinue PM dose) and reduce potassium to 5mEq daily (previously 2mEq).  Of note he is at [facility] so may need communicate with them as well

## 2020-06-30 ENCOUNTER — Ambulatory Visit: Payer: Medicare Other | Admitting: Cardiovascular Disease

## 2020-07-25 NOTE — Progress Notes (Deleted)
Office Visit    Patient Name: Shawn Jennings Date of Encounter: 07/25/2020  Primary Care Provider:  [facility] Primary Cardiologist:  [name], MD Electrophysiologist:  None   Chief Complaint    Shawn Jennings is a 74 y.o. male with a hx of CAD s/p CABG in 1994, chronic chest pain, HTN, HLD, paroxysmal atrial relation, ischemic cardiomyopathy, HFrEF, ASA s/p AVR, PAD s/p left AKA, DM2, GERD, depression presents today for follow-up after cardiac catheterization  Past Medical History    Past Medical History:  Diagnosis Date  . Aortic stenosis    a. Pt unaware of history but CT imaging 01/2019 consistent w/ AVR; b. 01/2019 Echo: Mild to mod AS. Mean grad 69mmHg.  Marland Kitchen CAD (coronary artery disease)    a. 1994 s/p CABG ([place]); b. Reports multiple stress tests over the years w/o repeat cath; c. 01/2019 MV: large, sev, fixed inf and inflat defect extending to apex. No ischemia. EF 37%-->Med rx given atypical Ss and pt wishes.  . CKD (chronic kidney disease), stage III   . COPD (chronic obstructive pulmonary disease) ([place])   . Depression   . Essential hypertension   . GERD (gastroesophageal reflux disease)   . Hyperlipidemia LDL goal <70   . Ischemic cardiomyopathy    a. 01/2019 Echo: EF 40-45%, impaired relaxation. Mildly dil LA. Sev mitral annular Ca2+. Mild to mod AS.  Marland Kitchen PAD (peripheral artery disease) ([place])    a. 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  Marland Kitchen PAF (paroxysmal atrial fibrillation) ([facility])    a. CHA2DS2VASc = 4-->Xarelto 15mg  daily in setting of CKD.  Marland Kitchen Thyrotoxicosis   . Tobacco abuse   . Type II diabetes mellitus ([place])    Past Surgical History:  Procedure Laterality Date  . CARDIAC CATHETERIZATION    . CHOLECYSTECTOMY    . CORONARY ARTERY BYPASS GRAFT     a. [place], [place]  . Left AKA    . RIGHT/LEFT HEART CATH AND CORONARY ANGIOGRAPHY N/A 06/06/2020   Procedure: RIGHT/LEFT HEART CATH AND CORONARY ANGIOGRAPHY;  Surgeon: Wellington Hampshire, MD;  Location: [place] [facility];  Service: Cardiovascular;  Laterality: N/A;    Allergies  No Known Allergies  History of Present Illness    Shawn Jennings is a 74 y.o. male with a hx of CAD s/p CABG in 1994, chronic chest pain, HTN, HLD, paroxysmal atrial relation, ischemic cardiomyopathy, HFrEF, ASA s/p AVR, PAD s/p left AKA, DM2, GERD, depression last seen for cardiac cath 06/06/20.  CAD s/p remote CABG in [place] in 1994.  Long history of chest pain with multiple stress tests over the years.  Admitted to [facility] April 2020 with chest pain.  Stress testing potentially high risk with large severe fixed inferior and inferolateral defect extends to the apex.  No ischemia noted.  EF 37%.  Echo with EF 40 to 45%.  He preferred to avoid cardiac catheterization and he had been medically managed since.  He was admitted May 2021 with recurrent atypical chest pain that was worse with deep breathing.  Troponins were normal.  He was seen by our team with recommendation for follow-up echo which was not performed.  He underwent barium esophagram that showed small hiatal hernia.  He was discharged home Mar 11, 2020.    When seen in follow-up 03/30/20 he was recommended for echocardiogram as it had not been performed while hospitalized.  Echo 05/05/2020 showed newly reduced LVEF 25 to 30%, LV with global hypokinesis,  mild LVH, grade 1 diastolic dysfunction, RV normal size and function, bioprosthetic aortic valve with normal function, borderline dilation of aortic root measuring 38 mm. Subsequent [facility]  06/06/20 with severe underlying coronary disease with patent grafts and RHC with normal filling  Seen in clinic 06/17/20. Coreg was added for optimization of HF therapies.   EKGs/Labs/Other Studies Reviewed:   The following studies were reviewed today:  Cardiac cath 06/06/20  Prox Cx to Mid Cx lesion is 100% stenosed.  Mid LAD lesion is 100% stenosed.  Prox RCA lesion is 100% stenosed.  LIMA graft was visualized by  non-selective angiography and is normal in caliber.  The graft exhibits no disease.  RIMA graft was visualized by non-selective angiography and is normal in caliber.  The graft exhibits no disease.   1.  Severe underlying three-vessel coronary artery disease with patent grafts including LIMA to LAD, SVG Y graft to second diagonal and OM 3 and RIMA to RCA. 2.  Left ventricular angiography was not performed.  EF was severely reduced by echo. 3.  Right heart catheterization showed normal filling pressures, minimal pulmonary hypertension and severely reduced cardiac output.   Recommendations: Continue medical therapy for chronic systolic heart failure. No need for any revascularization. Continue aggressive treatment of risk factors. Xarelto can be resumed tomorrow.  Echo 05/05/2020  1. Left ventricular ejection fraction, by estimation, is 25 to 30%. The  left ventricle has severely decreased function. The left ventricle  demonstrates global hypokinesis. There is mild left ventricular  hypertrophy. Left ventricular diastolic parameters   are consistent with Grade I diastolic dysfunction (impaired relaxation).   2. Right ventricular systolic function is normal. The right ventricular  size is normal.   3. The mitral valve is degenerative. No evidence of mitral valve  regurgitation.   4. The aortic valve has been repaired/replaced. Aortic valve  regurgitation is not visualized. There is a a bioprosthetic valve present  in the aortic position. Echo findings are consistent with normal structure  and function of the aortic valve  prosthesis.   5. Aortic dilatation noted. There is borderline dilatation of the aortic  root measuring 38 mm.   EKG:  No EKG is  ordered today.  The ekg independently reviewed from 06/17/20 demonstrated SR 72 bpm with lateral TWI which are also present on EKG 03/30/20. Short PR of 118ms. No acute ST/T wave changes.   Recent Labs: 03/11/2020: Magnesium 2.1 06/06/2020:  Hemoglobin 13.2; Platelets 210 06/17/2020: BUN 18; Creatinine, Ser 1.72; Potassium 4.1; Sodium 144  Recent Lipid Panel    Component Value Date/Time   CHOL 143 01/27/2019 0237   TRIG 152 (H) 01/27/2019 0237   HDL 51 01/27/2019 0237   CHOLHDL 2.8 01/27/2019 0237   VLDL 30 01/27/2019 0237   LDLCALC 62 01/27/2019 0237    Home Medications   No outpatient medications have been marked as taking for the 07/27/20 encounter (Appointment) with Loel Dubonnet, NP.    Review of Systems  *** ROS  All other systems reviewed and are otherwise negative except as noted above.  Physical Exam   *** VS:  There were no vitals taken for this visit. , BMI There is no height or weight on file to calculate BMI. GEN: Thin, well developed, in no acute distress. HEENT: normal. Neck: Supple, no JVD, carotid bruits, or masses. Cardiac: RRR, no murmurs, rubs, or gallops. No clubbing, cyanosis, edema.  Radials 2+ and equal bilaterally. RLE PT 1+ Respiratory:  Respirations regular and unlabored, clear  to auscultation bilaterally. GI: Soft, nontender, nondistended. MS: No deformity or atrophy. S/p L AKA. Skin: Warm and dry, no rash.  Neuro:  Strength and sensation are intact. Psych: Normal affect.  Assessment & Plan   *** 1. CAD -s/p CABG in 1994 and [place] with abnormal Myoview 11/2018 that he preferred medical therapy at that time. LHC 05/2020 with severe underlying coronary disease and patent grafts.  Present GDMT includes aspirin, statin, Coreg, nitrate therapy. ***  2. ICM/HFrEF -01/2019 EF 40-45% ? 04/2020 EF25-30%.  L/RHC 05/2020 with severe underlying CAD with patent bypass grafts, severely depressed CO, normal LVEDP. ***Euvolemic on exam and asymptomatic. GDMT includes Entresto 24-26mg  BID, Lasix 80mg  AM and 40mg  PM, coreg 3.125mg  BID. ***  Careful monitoring of renal function and BP as he has been hypotensive in the past. Reports no lightheadedness, dizziness, near syncope. Future considerations  include addition of Farxiga vs MRA.  3. S/p AVR -Systolic murmur noted.  Echo 05/05/2020 with bioprosthetic valve functioning appropriately. Continue to monitor with periodic echo. No chest pain, shortness of breath, near syncope.   4. HLD, LDL goal <70 - 01/2019 LDL 62. Continue high intensity statin. ***  5. CKD III -Careful titration of diuretics and antihypertensive agents.  6. PAF - *** Continue Xarelto 50 mg daily.   7. DM2 -Continue to follow with primary care. *** If renal function continues to decline may need to consider discontinuation of Metformin. Could consider addition of Dapagliflozin for HF benefit.   Disposition: Follow up *** with Dr. Fletcher Anon or APP.   Loel Dubonnet, NP 07/25/2020, 9:27 AM

## 2020-07-27 ENCOUNTER — Ambulatory Visit: Payer: Medicare Other | Admitting: Family

## 2020-08-18 ENCOUNTER — Emergency Department: Payer: Medicare Other

## 2020-08-18 ENCOUNTER — Other Ambulatory Visit: Payer: Self-pay

## 2020-08-18 ENCOUNTER — Emergency Department
Admission: EM | Admit: 2020-08-18 | Discharge: 2020-08-18 | Disposition: A | Discharge status: Home / Self Care | Payer: Medicare Other | Attending: Emergency Medicine | Admitting: Emergency Medicine

## 2020-08-18 DIAGNOSIS — K2970 Gastritis, unspecified, without bleeding: Secondary | ICD-10-CM | POA: Insufficient documentation

## 2020-08-18 DIAGNOSIS — J449 Chronic obstructive pulmonary disease, unspecified: Secondary | ICD-10-CM | POA: Diagnosis not present

## 2020-08-18 DIAGNOSIS — I25119 Atherosclerotic heart disease of native coronary artery with unspecified angina pectoris: Secondary | ICD-10-CM | POA: Insufficient documentation

## 2020-08-18 DIAGNOSIS — Z79899 Other long term (current) drug therapy: Secondary | ICD-10-CM | POA: Insufficient documentation

## 2020-08-18 DIAGNOSIS — E1122 Type 2 diabetes mellitus with diabetic chronic kidney disease: Secondary | ICD-10-CM | POA: Diagnosis not present

## 2020-08-18 DIAGNOSIS — I129 Hypertensive chronic kidney disease with stage 1 through stage 4 chronic kidney disease, or unspecified chronic kidney disease: Secondary | ICD-10-CM | POA: Insufficient documentation

## 2020-08-18 DIAGNOSIS — Z7984 Long term (current) use of oral hypoglycemic drugs: Secondary | ICD-10-CM | POA: Diagnosis not present

## 2020-08-18 DIAGNOSIS — Z7982 Long term (current) use of aspirin: Secondary | ICD-10-CM | POA: Insufficient documentation

## 2020-08-18 DIAGNOSIS — R1013 Epigastric pain: Secondary | ICD-10-CM | POA: Diagnosis present

## 2020-08-18 DIAGNOSIS — F17200 Nicotine dependence, unspecified, uncomplicated: Secondary | ICD-10-CM | POA: Diagnosis not present

## 2020-08-18 DIAGNOSIS — N1831 Chronic kidney disease, stage 3a: Secondary | ICD-10-CM | POA: Diagnosis not present

## 2020-08-18 DIAGNOSIS — Z951 Presence of aortocoronary bypass graft: Secondary | ICD-10-CM | POA: Diagnosis not present

## 2020-08-18 DIAGNOSIS — K2900 Acute gastritis without bleeding: Secondary | ICD-10-CM

## 2020-08-18 LAB — CBC
HCT: 36.1 % — ABNORMAL LOW (ref 39.0–52.0)
Hemoglobin: 11.7 g/dL — ABNORMAL LOW (ref 13.0–17.0)
MCH: 27.7 pg (ref 26.0–34.0)
MCHC: 32.4 g/dL (ref 30.0–36.0)
MCV: 85.5 fL (ref 80.0–100.0)
Platelets: 215 10*3/uL (ref 150–400)
RBC: 4.22 MIL/uL (ref 4.22–5.81)
RDW: 15.3 % (ref 11.5–15.5)
WBC: 8.2 10*3/uL (ref 4.0–10.5)
nRBC: 0 % (ref 0.0–0.2)

## 2020-08-18 LAB — COMPREHENSIVE METABOLIC PANEL
ALT: 10 U/L (ref 0–44)
AST: 13 U/L — ABNORMAL LOW (ref 15–41)
Albumin: 3.6 g/dL (ref 3.5–5.0)
Alkaline Phosphatase: 66 U/L (ref 38–126)
Anion gap: 11 (ref 5–15)
BUN: 14 mg/dL (ref 8–23)
CO2: 22 mmol/L (ref 22–32)
Calcium: 7.3 mg/dL — ABNORMAL LOW (ref 8.9–10.3)
Chloride: 109 mmol/L (ref 98–111)
Creatinine, Ser: 1.35 mg/dL — ABNORMAL HIGH (ref 0.61–1.24)
GFR, Estimated: 54 mL/min — ABNORMAL LOW (ref >60)
Glucose, Bld: 110 mg/dL — ABNORMAL HIGH (ref 70–99)
Potassium: 3.9 mmol/L (ref 3.5–5.1)
Sodium: 142 mmol/L (ref 135–145)
Total Bilirubin: 0.8 mg/dL (ref 0.3–1.2)
Total Protein: 6.4 g/dL — ABNORMAL LOW (ref 6.5–8.1)

## 2020-08-18 LAB — TROPONIN I (HIGH SENSITIVITY)
Troponin I (High Sensitivity): 11 ng/L (ref <18)
Troponin I (High Sensitivity): 11 ng/L (ref <18)

## 2020-08-18 LAB — LIPASE, BLOOD: Lipase: 58 U/L — ABNORMAL HIGH (ref 11–51)

## 2020-08-18 NOTE — ED Notes (Signed)
Pt to ED for c/o chronic chest pain. States "Only Morphine helps". States pain is constant, nagging and dull in description. States some SOB with pain, though denies cough.

## 2020-08-18 NOTE — ED Triage Notes (Signed)
Pt in with co epigastric pain that started yesterday. Pt here by [facility] from [place] for the same. Pt denies any hx of reflux does have a hx of bypass years ago. Facility gave him nitro x 2 without relief.

## 2020-08-18 NOTE — ED Provider Notes (Signed)
[facility] Emergency Department Provider Note   ____________________________________________    I have reviewed the triage vital signs and the nursing notes.   HISTORY  Chief Complaint Chest Pain     HPI Shawn Jennings is a 74 y.o. male with significant past medical history as detailed below including CAD with CABG who presents with complaints of epigastric pain patient described as chest pain to triage but it is actually more epigastric pain.  The pain started yesterday but has now resolved.  He denies daily alcohol use, does have a smoking history.  No history of pancreatitis in the past.  Did not take anything for this, no radiation of pain.  Past Medical History:  Diagnosis Date  . Aortic stenosis    a. Pt unaware of history but CT imaging 01/2019 consistent w/ AVR; b. 01/2019 Echo: Mild to mod AS. Mean grad 52mmHg.  Marland Kitchen CAD (coronary artery disease)    a. 1994 s/p CABG ([place]); b. Reports multiple stress tests over the years w/o repeat cath; c. 01/2019 MV: large, sev, fixed inf and inflat defect extending to apex. No ischemia. EF 37%-->Med rx given atypical Ss and pt wishes.  . CKD (chronic kidney disease), stage III   . COPD (chronic obstructive pulmonary disease) ([place])   . Depression   . Essential hypertension   . GERD (gastroesophageal reflux disease)   . Hyperlipidemia LDL goal <70   . Ischemic cardiomyopathy    a. 01/2019 Echo: EF 40-45%, impaired relaxation. Mildly dil LA. Sev mitral annular Ca2+. Mild to mod AS.  Marland Kitchen PAD (peripheral artery disease) ([place])    a. 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  Marland Kitchen PAF (paroxysmal atrial fibrillation) ([facility])    a. CHA2DS2VASc = 4-->Xarelto 15mg  daily in setting of CKD.  Marland Kitchen Thyrotoxicosis   . Tobacco abuse   . Type II diabetes mellitus [facility])     Patient Active Problem List   Diagnosis Date Noted  . Coronary artery disease of native artery of native heart with stable angina pectoris ([place])   . Ischemic cardiomyopathy   .  Other chest pain 03/10/2020  . Chronic kidney disease, stage 3a ([place]) 03/09/2020  . Essential hypertension 03/09/2020  . Chest pain 01/27/2019  . Renal insufficiency   . Nonspecific chest pain 01/26/2019    Past Surgical History:  Procedure Laterality Date  . CARDIAC CATHETERIZATION    . CHOLECYSTECTOMY    . CORONARY ARTERY BYPASS GRAFT     a. [place], [place]  . Left AKA    . RIGHT/LEFT HEART CATH AND CORONARY ANGIOGRAPHY N/A 06/06/2020   Procedure: RIGHT/LEFT HEART CATH AND CORONARY ANGIOGRAPHY;  Surgeon: Wellington Hampshire, MD;  Location: [facility];  Service: Cardiovascular;  Laterality: N/A;    Prior to Admission medications   Medication Sig Start Date End Date Taking? Authorizing Provider  acetaminophen (TYLENOL) 500 MG tablet Take 500 mg by mouth every 6 (six) hours as needed.    [provider]  albuterol (PROVENTIL) (2.5 MG/3ML) 0.083% nebulizer solution Take 2.5 mg by nebulization every 4 (four) hours as needed for wheezing or shortness of breath.    [provider]  albuterol (VENTOLIN HFA) 108 (90 Base) MCG/ACT inhaler Inhale 2 puffs into the lungs every 6 (six) hours as needed for wheezing or shortness of breath.     [provider]  aspirin EC 81 MG tablet Take 81 mg by mouth daily.    [provider]  atorvastatin (LIPITOR)  80 MG tablet Take 80 mg by mouth at bedtime.    [provider]  carvedilol (COREG) 3.125 MG tablet Take 1 tablet (3.125 mg total) by mouth 2 (two) times daily. 06/17/20 09/15/20  Loel Dubonnet, NP  DULoxetine (CYMBALTA) 30 MG capsule Take 30 mg by mouth 2 (two) times daily.    [provider]  ferrous sulfate 325 (65 FE) MG tablet Take 325 mg by mouth daily.    [provider]  Fluticasone-Salmeterol (ADVAIR) 250-50 MCG/DOSE AEPB Inhale 1 puff into the lungs 2 (two) times daily.    [provider]  furosemide (LASIX) 80 MG tablet Take 1 tablet (80 mg total) once daily  in the morning 06/20/20   Loel Dubonnet, NP  hydrOXYzine (ATARAX/VISTARIL) 25 MG tablet Take 25 mg by mouth every 6 (six) hours as needed (allergy symptoms).    [provider]  isosorbide mononitrate (IMDUR) 30 MG 24 hr tablet Take 1 tablet (30 mg total) by mouth daily. 03/11/20 05/27/29  Sharen Hones, MD  ketotifen (ZADITOR) 0.025 % ophthalmic solution Place 1 drop into both eyes 2 (two) times daily as needed.     [provider]  lactulose (CHRONULAC) 10 GM/15ML solution Take 30 g by mouth every 12 (twelve) hours as needed for mild constipation.    [provider]  lamoTRIgine (LAMICTAL) 25 MG tablet Take 25 mg by mouth 2 (two) times daily.    [provider]  lidocaine (LIDODERM) 5 % Cut 1 patch in half and apply to amputated stump once daily 12 hours on, 12 hours off    [provider]  liver oil-zinc oxide (DESITIN) 40 % ointment Apply 1 application topically at bedtime as needed for irritation. To buttocks     [provider]  LORazepam (ATIVAN) 0.5 MG tablet Take 0.5 mg by mouth 2 (two) times daily as needed for anxiety.    [provider]  Melatonin 3 MG TABS Take 1 tablet by mouth at bedtime.    [provider]  metFORMIN (GLUCOPHAGE) 500 MG tablet Take 500 mg by mouth 2 (two) times daily with a meal.     [provider]  montelukast (SINGULAIR) 10 MG tablet Take 10 mg by mouth at bedtime.    [provider]  Multiple Vitamins-Minerals (PRESERVISION AREDS 2) CAPS Take 1 capsule by mouth 2 (two) times daily.    [provider]  nicotine (NICODERM CQ - DOSED IN MG/24 HOURS) 14 mg/24hr patch Place 14 mg onto the skin daily.    [provider]  nitroGLYCERIN (NITROSTAT) 0.4 MG SL tablet Place 0.4 mg under the tongue every 5 (five) minutes as needed for chest pain.    [provider]  pantoprazole (PROTONIX) 40 MG tablet Take 40 mg by mouth daily.    [provider]   potassium chloride SA (KLOR-CON) 20 MEQ tablet Take 1 tablet (20 mEq total) by mouth daily. 06/20/20 09/18/20  Loel Dubonnet, NP  Rivaroxaban (XARELTO) 15 MG TABS tablet Take 15 mg by mouth daily.    [provider]  sacubitril-valsartan (ENTRESTO) 24-26 MG Take 1 tablet by mouth 2 (two) times daily. 05/27/20   Loel Dubonnet, NP  senna (SENOKOT) 8.6 MG tablet Take 2 tablets by mouth at bedtime.    [provider]  tamsulosin (FLOMAX) 0.4 MG CAPS capsule Take 0.4 mg by mouth daily.    [provider]  traZODone (DESYREL) 50 MG tablet Take 25 mg by  mouth at bedtime as needed for sleep.    [provider]  triamcinolone cream (KENALOG) 0.1 % Apply 1 application topically 2 (two) times daily.    [provider]     Allergies Patient has no known allergies.  Family History  Problem Relation Age of Onset  . Heart attack Mother        died @ 26.  . Other Father        never knew his father.  . Other Sister        overdose of sleeping pills.  . Heart disease Brother        died in his 36's  . Other Sister        complication of abd surgery @ 77  . CAD Sister     Social History Social History   Tobacco Use  . Smoking status: Current Every Day Smoker    Packs/day: 1.00    Years: 62.00    Pack years: 62.00  . Smokeless tobacco: Never Used  Vaping Use  . Vaping Use: Never used  Substance Use Topics  . Alcohol use: Never  . Drug use: Never    Review of Systems  Constitutional: No fever/chills Eyes: No visual changes.  ENT: No sore throat. Cardiovascular: No chest pain Respiratory: Denies shortness of breath. Gastrointestinal: As above Genitourinary: Negative for dysuria. Musculoskeletal: Negative for back pain. Skin: Negative for rash. Neurological: Negative for headaches    ____________________________________________   PHYSICAL EXAM:  VITAL SIGNS: ED Triage Vitals [08/18/20 0207]  Enc Vitals Group     BP 124/82      Pulse Rate 72     Resp 20     Temp 98.8 F (37.1 C)     Temp Source Oral     SpO2 96 %     Weight 63.5 kg (140 lb)     Height 1.829 m (6')     Head Circumference      Peak Flow      Pain Score 7     Pain Loc      Pain Edu?      Excl. in [place]?     Constitutional: Alert and oriented. No acute distress.   Nose: No congestion/rhinnorhea. Mouth/Throat: Mucous membranes are moist.   Neck:  Painless ROM Cardiovascular: Normal rate, regular rhythm. Grossly normal heart sounds.  Good peripheral circulation. Respiratory: Normal respiratory effort.  No retractions. Lungs CTAB. Gastrointestinal: Soft and nontender. No distention.  No CVA tenderness. Genitourinary: deferred Musculoskeletal: History of PAD, right BKA Neurologic:  Normal speech and language. No gross focal neurologic deficits are appreciated.  Skin:  Skin is warm, dry and intact.  Psychiatric: Mood and affect are normal. Speech and behavior are normal.  ____________________________________________   LABS (all labs ordered are listed, but only abnormal results are displayed)  Labs Reviewed  CBC - Abnormal; Notable for the following components:      Result Value   Hemoglobin 11.7 (*)    HCT 36.1 (*)    All other components within normal limits  COMPREHENSIVE METABOLIC PANEL - Abnormal; Notable for the following components:   Glucose, Bld 110 (*)    Creatinine, Ser 1.35 (*)    Calcium 7.3 (*)    Total Protein 6.4 (*)    AST 13 (*)    GFR, Estimated 54 (*)    All other components within normal limits  LIPASE, BLOOD - Abnormal; Notable for the following components:   Lipase 58 (*)  All other components within normal limits  TROPONIN I (HIGH SENSITIVITY)  TROPONIN I (HIGH SENSITIVITY)   ____________________________________________  EKG  ED ECG REPORT I, Lavonia Drafts, the attending physician, personally viewed and interpreted this ECG.  Date: 08/18/2020  Rhythm: normal sinus rhythm QRS Axis:  normal Intervals: normal ST/T Wave abnormalities: normal Narrative Interpretation: no evidence of acute ischemia  ____________________________________________  RADIOLOGY  Chest x-ray viewed by me, no infiltrate or effusion ____________________________________________   PROCEDURES  Procedure(s) performed: No  .1-3 Lead EKG Interpretation Performed by: Lavonia Drafts, MD Authorized by: Lavonia Drafts, MD     Interpretation: normal     ECG rate assessment: normal     Rhythm: sinus rhythm     Ectopy: none     Conduction: normal       Critical Care performed: No ____________________________________________   INITIAL IMPRESSION / ASSESSMENT AND PLAN / ED COURSE  Pertinent labs & imaging results that were available during my care of the patient were reviewed by me and considered in my medical decision making (see chart for details).  Patient presents with epigastric pain as described above, now resolved.  Reviewed patient's medical history, he had a cardiac catheterization on August 26 with significant disease but patent grafts, cardiology plan for medical management.  No chest pain today, primary complaint of epigastric discomfort suspicious for gastritis versus pancreatitis.  Mild elevation in lipase however resolution of pain would be unusual for pancreatitis so I do suspect gastritis as the cause.  Not consistent with ACS, delta troponin unchanged.  EKG reassuring.  Chest x-ray without evidence of pneumonia or infiltrate.  Recommended further work-up including CT scan of the abdomen given his complaints however patient declined and states that since he is feeling better he would like to be discharged.  I felt that was a reasonable plan as he agreed to return if any change in his symptoms.    ____________________________________________   FINAL CLINICAL IMPRESSION(S) / ED DIAGNOSES  Final diagnoses:  Acute gastritis without hemorrhage, unspecified gastritis type         Note:  This document was prepared using Dragon voice recognition software and may include unintentional dictation errors.   Lavonia Drafts, MD 08/18/20 [phone]

## 2020-08-18 NOTE — ED Notes (Signed)
Pt taken to lobby at this time. [facility] coming to transport pt back home.

## 2020-08-23 ENCOUNTER — Other Ambulatory Visit: Payer: Self-pay

## 2020-08-23 ENCOUNTER — Ambulatory Visit (INDEPENDENT_AMBULATORY_CARE_PROVIDER_SITE_OTHER): Payer: Medicare Other | Admitting: Family

## 2020-08-23 ENCOUNTER — Encounter: Payer: Self-pay | Admitting: Family

## 2020-08-23 VITALS — BP 100/56 | HR 70 | Ht 72.0 in | Wt 140.0 lb

## 2020-08-23 DIAGNOSIS — I10 Essential (primary) hypertension: Secondary | ICD-10-CM | POA: Diagnosis not present

## 2020-08-23 DIAGNOSIS — I5020 Unspecified systolic (congestive) heart failure: Secondary | ICD-10-CM

## 2020-08-23 DIAGNOSIS — I480 Paroxysmal atrial fibrillation: Secondary | ICD-10-CM

## 2020-08-23 DIAGNOSIS — Z952 Presence of prosthetic heart valve: Secondary | ICD-10-CM

## 2020-08-23 DIAGNOSIS — I25118 Atherosclerotic heart disease of native coronary artery with other forms of angina pectoris: Secondary | ICD-10-CM

## 2020-08-23 DIAGNOSIS — Z7901 Long term (current) use of anticoagulants: Secondary | ICD-10-CM

## 2020-08-23 DIAGNOSIS — I255 Ischemic cardiomyopathy: Secondary | ICD-10-CM

## 2020-08-23 DIAGNOSIS — E785 Hyperlipidemia, unspecified: Secondary | ICD-10-CM | POA: Diagnosis not present

## 2020-08-23 NOTE — Progress Notes (Signed)
Office Visit    Patient Name: Shawn Jennings Date of Encounter: 08/23/2020  Primary Care Provider:  [facility] Primary Cardiologist:  [name], MD Electrophysiologist:  None   Chief Complaint    Shawn Jennings is a 74 y.o. male with a hx of CAD s/p CABG in 1994, chronic chest pain, HTN, HLD, paroxysmal atrial relation, ischemic cardiomyopathy, HFrEF, ASA s/p AVR, PAD s/p left AKA, DM2, GERD, depression presents today for follow-up after addition of carvedilol  Past Medical History    Past Medical History:  Diagnosis Date  . Aortic stenosis    a. Pt unaware of history but CT imaging 01/2019 consistent w/ AVR; b. 01/2019 Echo: Mild to mod AS. Mean grad 32mmHg.  Marland Kitchen CAD (coronary artery disease)    a. 1994 s/p CABG ([place]); b. Reports multiple stress tests over the years w/o repeat cath; c. 01/2019 MV: large, sev, fixed inf and inflat defect extending to apex. No ischemia. EF 37%-->Med rx given atypical Ss and pt wishes.  . CKD (chronic kidney disease), stage III ([place])   . COPD (chronic obstructive pulmonary disease) ([place])   . Depression   . Essential hypertension   . GERD (gastroesophageal reflux disease)   . Hyperlipidemia LDL goal <70   . Ischemic cardiomyopathy    a. 01/2019 Echo: EF 40-45%, impaired relaxation. Mildly dil LA. Sev mitral annular Ca2+. Mild to mod AS.  Marland Kitchen PAD (peripheral artery disease) ([place])    a. 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  Marland Kitchen PAF (paroxysmal atrial fibrillation) ([facility])    a. CHA2DS2VASc = 4-->Xarelto 15mg  daily in setting of CKD.  Marland Kitchen Thyrotoxicosis   . Tobacco abuse   . Type II diabetes mellitus ([place])    Past Surgical History:  Procedure Laterality Date  . CARDIAC CATHETERIZATION    . CHOLECYSTECTOMY    . CORONARY ARTERY BYPASS GRAFT     a. [place], [place]  . Left AKA    . RIGHT/LEFT HEART CATH AND CORONARY ANGIOGRAPHY N/A 06/06/2020   Procedure: RIGHT/LEFT HEART CATH AND CORONARY ANGIOGRAPHY;  Surgeon: Wellington Hampshire, MD;  Location:  [facility];  Service: Cardiovascular;  Laterality: N/A;    Allergies  No Known Allergies  History of Present Illness    Shawn Jennings is a 74 y.o. male with a hx of CAD s/p CABG in 1994, chronic chest pain, HTN, HLD, paroxysmal atrial relation, ischemic cardiomyopathy, HFrEF, ASA s/p AVR, PAD s/p left AKA, DM2, GERD, depression last seen 06/17/20.  CAD s/p remote CABG in [place] in 1994.  Long history of chest pain with multiple stress tests over the years.  Admitted to [facility] April 2020 with chest pain.  Stress testing potentially high risk with large severe fixed inferior and inferolateral defect extends to the apex.  No ischemia noted.  EF 37%.  Echo with EF 40 to 45%.  He preferred to avoid cardiac catheterization and he had been medically managed since.  He was admitted May 2021 with recurrent atypical chest pain that was worse with deep breathing.  Troponins were normal.  He was seen by our team with recommendation for follow-up echo which was not performed.  He underwent barium esophagram that showed small hiatal hernia.  He was discharged home Mar 11, 2020.    When seen in follow-up 03/30/20 he was recommended for echocardiogram as it had not been performed while hospitalized.  Echo 05/05/2020 showed newly reduced LVEF 25 to 30%, LV with global hypokinesis, mild  LVH, grade 1 diastolic dysfunction, RV normal size and function, bioprosthetic aortic valve with normal function, borderline dilation of aortic root measuring 38 mm. Subsequent [facility]  06/06/20 with severe underlying coronary disease with patent grafts and RHC with normal filling  Seen in clinic 06/17/20. Coreg was added for optimization of HF therapies. Lab work collected showed mild decline in renal function and he was recommended to reduce Lasix to 80mg  in the AM only and reduce Potassium to 55mEq daily.  He was seen in the ED 08/10/2020 for gastritis.  Lab work with K3.9, creatinine 1.35, GFR 54, hemoglobin  11.7.  Presents today for follow-up after addition of carvedilol.  Reports no chest pain, pressure, tightness.  Reports no shortness of breath at rest no dyspnea on exertion.  No orthopnea, PND, edema.  He has relative hypotension in clinic today but denies lightheadedness, dizziness, near-syncope, syncope.  He reports feeling overall well.  EKGs/Labs/Other Studies Reviewed:   The following studies were reviewed today:  Cardiac cath 06/06/20  Prox Cx to Mid Cx lesion is 100% stenosed.  Mid LAD lesion is 100% stenosed.  Prox RCA lesion is 100% stenosed.  LIMA graft was visualized by non-selective angiography and is normal in caliber.  The graft exhibits no disease.  RIMA graft was visualized by non-selective angiography and is normal in caliber.  The graft exhibits no disease.   1.  Severe underlying three-vessel coronary artery disease with patent grafts including LIMA to LAD, SVG Y graft to second diagonal and OM 3 and RIMA to RCA. 2.  Left ventricular angiography was not performed.  EF was severely reduced by echo. 3.  Right heart catheterization showed normal filling pressures, minimal pulmonary hypertension and severely reduced cardiac output.   Recommendations: Continue medical therapy for chronic systolic heart failure. No need for any revascularization. Continue aggressive treatment of risk factors. Xarelto can be resumed tomorrow.  Echo 05/05/2020  1. Left ventricular ejection fraction, by estimation, is 25 to 30%. The  left ventricle has severely decreased function. The left ventricle  demonstrates global hypokinesis. There is mild left ventricular  hypertrophy. Left ventricular diastolic parameters   are consistent with Grade I diastolic dysfunction (impaired relaxation).   2. Right ventricular systolic function is normal. The right ventricular  size is normal.   3. The mitral valve is degenerative. No evidence of mitral valve  regurgitation.   4. The aortic valve  has been repaired/replaced. Aortic valve  regurgitation is not visualized. There is a a bioprosthetic valve present  in the aortic position. Echo findings are consistent with normal structure  and function of the aortic valve  prosthesis.   5. Aortic dilatation noted. There is borderline dilatation of the aortic  root measuring 38 mm.   EKG:  EKG is  ordered today.EKG performed today demonstrates NSR 70 bpm with stable lateral TWI and no acute ST/T wave changes.   Recent Labs: 03/11/2020: Magnesium 2.1 08/18/2020: ALT 10; BUN 14; Creatinine, Ser 1.35; Hemoglobin 11.7; Platelets 215; Potassium 3.9; Sodium 142  Recent Lipid Panel    Component Value Date/Time   CHOL 143 01/27/2019 0237   TRIG 152 (H) 01/27/2019 0237   HDL 51 01/27/2019 0237   CHOLHDL 2.8 01/27/2019 0237   VLDL 30 01/27/2019 0237   LDLCALC 62 01/27/2019 0237    Home Medications   Current Meds  Medication Sig  . acetaminophen (TYLENOL) 500 MG tablet Take 500 mg by mouth every 6 (six) hours as needed.  Marland Kitchen albuterol (PROVENTIL) (2.5  MG/3ML) 0.083% nebulizer solution Take 2.5 mg by nebulization every 4 (four) hours as needed for wheezing or shortness of breath.  Marland Kitchen albuterol (VENTOLIN HFA) 108 (90 Base) MCG/ACT inhaler Inhale 2 puffs into the lungs every 6 (six) hours as needed for wheezing or shortness of breath.   Marland Kitchen aspirin EC 81 MG tablet Take 81 mg by mouth daily.  Marland Kitchen atorvastatin (LIPITOR) 80 MG tablet Take 80 mg by mouth at bedtime.  . carvedilol (COREG) 3.125 MG tablet Take 1 tablet (3.125 mg total) by mouth 2 (two) times daily.  . DULoxetine (CYMBALTA) 30 MG capsule Take 30 mg by mouth 2 (two) times daily.  . ferrous sulfate 325 (65 FE) MG tablet Take 325 mg by mouth daily.  . Fluticasone-Salmeterol (ADVAIR) 250-50 MCG/DOSE AEPB Inhale 1 puff into the lungs 2 (two) times daily.  . furosemide (LASIX) 80 MG tablet Take 1 tablet (80 mg total) once daily in the morning  . hydrOXYzine (ATARAX/VISTARIL) 25 MG tablet Take  25 mg by mouth every 6 (six) hours as needed (allergy symptoms).  . isosorbide mononitrate (IMDUR) 30 MG 24 hr tablet Take 1 tablet (30 mg total) by mouth daily.  Marland Kitchen ketotifen (ZADITOR) 0.025 % ophthalmic solution Place 1 drop into both eyes 2 (two) times daily as needed.   . lactulose (CHRONULAC) 10 GM/15ML solution Take 30 g by mouth every 12 (twelve) hours as needed for mild constipation.  Marland Kitchen lamoTRIgine (LAMICTAL) 25 MG tablet Take 25 mg by mouth 2 (two) times daily.  Marland Kitchen lidocaine (LIDODERM) 5 % Cut 1 patch in half and apply to amputated stump once daily 12 hours on, 12 hours off  . liver oil-zinc oxide (DESITIN) 40 % ointment Apply 1 application topically at bedtime as needed for irritation. To buttocks   . loperamide (IMODIUM) 2 MG capsule Take by mouth.  Marland Kitchen LORazepam (ATIVAN) 0.5 MG tablet Take 0.5 mg by mouth 2 (two) times daily as needed for anxiety.  . Melatonin 3 MG TABS Take 1 tablet by mouth at bedtime.  . metFORMIN (GLUCOPHAGE) 1000 MG tablet Take by mouth.  . metFORMIN (GLUCOPHAGE) 500 MG tablet Take 500 mg by mouth 2 (two) times daily with a meal.   . montelukast (SINGULAIR) 10 MG tablet Take 10 mg by mouth at bedtime.  . Multiple Vitamins-Minerals (PRESERVISION AREDS 2) CAPS Take 1 capsule by mouth 2 (two) times daily.  . nicotine (NICODERM CQ - DOSED IN MG/24 HOURS) 14 mg/24hr patch Place 14 mg onto the skin daily.  . nitroGLYCERIN (NITROSTAT) 0.4 MG SL tablet Place 0.4 mg under the tongue every 5 (five) minutes as needed for chest pain.  . pantoprazole (PROTONIX) 40 MG tablet Take 40 mg by mouth daily.  . potassium chloride SA (KLOR-CON) 20 MEQ tablet Take 1 tablet (20 mEq total) by mouth daily.  . Rivaroxaban (XARELTO) 15 MG TABS tablet Take 15 mg by mouth daily.  . sacubitril-valsartan (ENTRESTO) 24-26 MG Take 1 tablet by mouth 2 (two) times daily.  Marland Kitchen senna (SENOKOT) 8.6 MG tablet Take 2 tablets by mouth at bedtime.  . tamsulosin (FLOMAX) 0.4 MG CAPS capsule Take 0.4 mg by mouth  daily.  . traZODone (DESYREL) 50 MG tablet Take 25 mg by mouth at bedtime as needed for sleep.  Marland Kitchen triamcinolone cream (KENALOG) 0.1 % Apply 1 application topically 2 (two) times daily.    Review of Systems   All other systems reviewed and are otherwise negative except as noted above.  Physical Exam  VS:  BP (!) 100/56 (BP Location: Left Arm, Patient Position: Sitting, Cuff Size: Normal)   Pulse 70   Ht 6' (1.829 m)   Wt 140 lb (63.5 kg)   SpO2 94%   BMI 18.99 kg/m  , BMI Body mass index is 18.99 kg/m. GEN: Thin, well developed, in no acute distress. HEENT: normal. Neck: Supple, no JVD, carotid bruits, or masses. Cardiac: RRR, no  rubs, or gallops. Gr 2/6 systolic murmur. No clubbing, cyanosis, edema.  Radials 2+ and equal bilaterally. RLE PT 1+ Respiratory:  Respirations regular and unlabored, clear to auscultation bilaterally. GI: Soft, nontender, nondistended. MS: No deformity or atrophy. S/p L AKA. Skin: Warm and dry, no rash.  Neuro:  Strength and sensation are intact. Psych: Normal affect.  Assessment & Plan   1. CAD -s/p CABG in 1994 and [place] with abnormal Myoview 11/2018 that he preferred medical therapy at that time. LHC 05/2020 with severe underlying coronary disease and patent grafts.  Present GDMT includes aspirin, statin, Coreg, nitrate therapy.  Continue low-sodium, heartily diet.  2. ICM/HFrEF -01/2019 EF 40-45% ? 04/2020 EF25-30%.  L/RHC 05/2020 with severe underlying CAD with patent bypass grafts, severely depressed CO, normal LVEDP. Euvolemic on exam and asymptomatic. GDMT includes Entresto 24-26mg  BID, Lasix 80mg  QD, coreg 3.125mg  BID. Careful monitoring of renal function and BP as he has been hypotensive in the past. Reports no lightheadedness, dizziness, near syncope.  Unable to add MRA or Farxiga today due to low normal BP.   3. S/p AVR -Systolic murmur noted.  Echo 05/05/2020 with bioprosthetic valve functioning appropriately. Continue to monitor with  periodic echo. No chest pain, shortness of breath, near syncope.   4. HLD, LDL goal <70 - 01/2019 LDL 62. Continue high intensity statin.   5. CKD III -Careful titration of diuretics and antihypertensive agents.  6. PAF - No evidence of recurrence. Maintaining NSR by EKG today. Continue Xarelto 15 mg daily. Recent hemoglobin stable. Denies bleeding complications.   7. DM2 -Continue to follow with primary care.   Disposition: Follow up in 4 months with Dr. Fletcher Anon or APP.   Loel Dubonnet, NP 08/23/2020, 10:58 AM

## 2020-08-23 NOTE — Patient Instructions (Signed)
Medication Instructions:  Your physician recommends that you continue on your current medications as directed. Please refer to the Current Medication list given to you today.  *If you need a refill on your cardiac medications before your next appointment, please call your pharmacy*   Lab Work: None ordered.  If you have labs (blood work) drawn today and your tests are completely normal, you will receive your results only by: Marland Kitchen MyChart Message (if you have MyChart) OR . A paper copy in the mail If you have any lab test that is abnormal or we need to change your treatment, we will call you to review the results.   Testing/Procedures: None ordered.   Follow-Up: At [facility], you and your health needs are our priority.  As part of our continuing mission to provide you with exceptional heart care, we have created designated Provider Care Teams.  These Care Teams include your primary Cardiologist (physician) and Advanced Practice Providers (APPs -  Physician Assistants and Nurse Practitioners) who all work together to provide you with the care you need, when you need it.  We recommend signing up for the patient portal called "MyChart".  Sign up information is provided on this After Visit Summary.  MyChart is used to connect with patients for Virtual Visits (Telemedicine).  Patients are able to view lab/test results, encounter notes, upcoming appointments, etc.  Non-urgent messages can be sent to your provider as well.   To learn more about what you can do with MyChart, go to [email or web address].    Your next appointment:   4 month(s)  The format for your next appointment:   In Person  Provider:   You may see [name], MD or one of the following Advanced Practice Providers on your designated Care Team:    Murray Hodgkins, NP  Christell Faith, PA-C  Marrianne Mood, PA-C  Cadence Kathlen Mody, [place]    Other Instructions None.

## 2020-10-17 ENCOUNTER — Emergency Department: Payer: Medicare Other

## 2020-10-17 ENCOUNTER — Inpatient Hospital Stay
Admission: EM | Admit: 2020-10-17 | Discharge: 2020-10-25 | DRG: 871 | Disposition: A | Discharge status: Skilled Nursing Facility | Payer: Medicare Other | Attending: Internal Medicine | Admitting: Internal Medicine

## 2020-10-17 ENCOUNTER — Encounter: Payer: Self-pay | Admitting: Radiology

## 2020-10-17 ENCOUNTER — Other Ambulatory Visit: Payer: Self-pay

## 2020-10-17 DIAGNOSIS — K8050 Calculus of bile duct without cholangitis or cholecystitis without obstruction: Secondary | ICD-10-CM | POA: Diagnosis present

## 2020-10-17 DIAGNOSIS — I248 Other forms of acute ischemic heart disease: Secondary | ICD-10-CM | POA: Diagnosis present

## 2020-10-17 DIAGNOSIS — I130 Hypertensive heart and chronic kidney disease with heart failure and stage 1 through stage 4 chronic kidney disease, or unspecified chronic kidney disease: Secondary | ICD-10-CM | POA: Diagnosis present

## 2020-10-17 DIAGNOSIS — E872 Acidosis: Secondary | ICD-10-CM | POA: Diagnosis present

## 2020-10-17 DIAGNOSIS — K219 Gastro-esophageal reflux disease without esophagitis: Secondary | ICD-10-CM | POA: Diagnosis present

## 2020-10-17 DIAGNOSIS — Z89512 Acquired absence of left leg below knee: Secondary | ICD-10-CM

## 2020-10-17 DIAGNOSIS — G8929 Other chronic pain: Secondary | ICD-10-CM | POA: Diagnosis present

## 2020-10-17 DIAGNOSIS — E785 Hyperlipidemia, unspecified: Secondary | ICD-10-CM | POA: Diagnosis present

## 2020-10-17 DIAGNOSIS — R109 Unspecified abdominal pain: Secondary | ICD-10-CM

## 2020-10-17 DIAGNOSIS — J440 Chronic obstructive pulmonary disease with acute lower respiratory infection: Secondary | ICD-10-CM | POA: Diagnosis not present

## 2020-10-17 DIAGNOSIS — J9601 Acute respiratory failure with hypoxia: Secondary | ICD-10-CM | POA: Diagnosis not present

## 2020-10-17 DIAGNOSIS — J441 Chronic obstructive pulmonary disease with (acute) exacerbation: Secondary | ICD-10-CM | POA: Diagnosis present

## 2020-10-17 DIAGNOSIS — R6521 Severe sepsis with septic shock: Secondary | ICD-10-CM | POA: Diagnosis present

## 2020-10-17 DIAGNOSIS — A419 Sepsis, unspecified organism: Principal | ICD-10-CM | POA: Diagnosis present

## 2020-10-17 DIAGNOSIS — D631 Anemia in chronic kidney disease: Secondary | ICD-10-CM | POA: Diagnosis present

## 2020-10-17 DIAGNOSIS — K851 Biliary acute pancreatitis without necrosis or infection: Secondary | ICD-10-CM | POA: Diagnosis not present

## 2020-10-17 DIAGNOSIS — Z716 Tobacco abuse counseling: Secondary | ICD-10-CM

## 2020-10-17 DIAGNOSIS — R7401 Elevation of levels of liver transaminase levels: Secondary | ICD-10-CM | POA: Diagnosis present

## 2020-10-17 DIAGNOSIS — Z79899 Other long term (current) drug therapy: Secondary | ICD-10-CM

## 2020-10-17 DIAGNOSIS — I2582 Chronic total occlusion of coronary artery: Secondary | ICD-10-CM | POA: Diagnosis present

## 2020-10-17 DIAGNOSIS — Z7951 Long term (current) use of inhaled steroids: Secondary | ICD-10-CM

## 2020-10-17 DIAGNOSIS — N1831 Chronic kidney disease, stage 3a: Secondary | ICD-10-CM | POA: Diagnosis present

## 2020-10-17 DIAGNOSIS — R7989 Other specified abnormal findings of blood chemistry: Secondary | ICD-10-CM

## 2020-10-17 DIAGNOSIS — F32A Depression, unspecified: Secondary | ICD-10-CM | POA: Diagnosis present

## 2020-10-17 DIAGNOSIS — Z79891 Long term (current) use of opiate analgesic: Secondary | ICD-10-CM

## 2020-10-17 DIAGNOSIS — I5022 Chronic systolic (congestive) heart failure: Secondary | ICD-10-CM | POA: Diagnosis present

## 2020-10-17 DIAGNOSIS — Z7984 Long term (current) use of oral hypoglycemic drugs: Secondary | ICD-10-CM

## 2020-10-17 DIAGNOSIS — J189 Pneumonia, unspecified organism: Secondary | ICD-10-CM | POA: Diagnosis not present

## 2020-10-17 DIAGNOSIS — R001 Bradycardia, unspecified: Secondary | ICD-10-CM | POA: Diagnosis not present

## 2020-10-17 DIAGNOSIS — N179 Acute kidney failure, unspecified: Secondary | ICD-10-CM

## 2020-10-17 DIAGNOSIS — F17210 Nicotine dependence, cigarettes, uncomplicated: Secondary | ICD-10-CM | POA: Diagnosis present

## 2020-10-17 DIAGNOSIS — K8042 Calculus of bile duct with acute cholecystitis without obstruction: Secondary | ICD-10-CM

## 2020-10-17 DIAGNOSIS — E876 Hypokalemia: Secondary | ICD-10-CM | POA: Diagnosis present

## 2020-10-17 DIAGNOSIS — E43 Unspecified severe protein-calorie malnutrition: Secondary | ICD-10-CM | POA: Diagnosis present

## 2020-10-17 DIAGNOSIS — Z7901 Long term (current) use of anticoagulants: Secondary | ICD-10-CM

## 2020-10-17 DIAGNOSIS — E1122 Type 2 diabetes mellitus with diabetic chronic kidney disease: Secondary | ICD-10-CM | POA: Diagnosis present

## 2020-10-17 DIAGNOSIS — K8309 Other cholangitis: Secondary | ICD-10-CM | POA: Diagnosis not present

## 2020-10-17 DIAGNOSIS — I2510 Atherosclerotic heart disease of native coronary artery without angina pectoris: Secondary | ICD-10-CM | POA: Diagnosis present

## 2020-10-17 DIAGNOSIS — Z9049 Acquired absence of other specified parts of digestive tract: Secondary | ICD-10-CM

## 2020-10-17 DIAGNOSIS — Z951 Presence of aortocoronary bypass graft: Secondary | ICD-10-CM

## 2020-10-17 DIAGNOSIS — Z6821 Body mass index (BMI) 21.0-21.9, adult: Secondary | ICD-10-CM

## 2020-10-17 DIAGNOSIS — Z89612 Acquired absence of left leg above knee: Secondary | ICD-10-CM

## 2020-10-17 DIAGNOSIS — Z953 Presence of xenogenic heart valve: Secondary | ICD-10-CM

## 2020-10-17 DIAGNOSIS — Z20822 Contact with and (suspected) exposure to covid-19: Secondary | ICD-10-CM | POA: Diagnosis present

## 2020-10-17 DIAGNOSIS — I480 Paroxysmal atrial fibrillation: Secondary | ICD-10-CM | POA: Diagnosis present

## 2020-10-17 DIAGNOSIS — Z8249 Family history of ischemic heart disease and other diseases of the circulatory system: Secondary | ICD-10-CM

## 2020-10-17 DIAGNOSIS — Z452 Encounter for adjustment and management of vascular access device: Secondary | ICD-10-CM

## 2020-10-17 DIAGNOSIS — K8031 Calculus of bile duct with cholangitis, unspecified, with obstruction: Secondary | ICD-10-CM | POA: Diagnosis present

## 2020-10-17 DIAGNOSIS — Z7982 Long term (current) use of aspirin: Secondary | ICD-10-CM

## 2020-10-17 DIAGNOSIS — E1151 Type 2 diabetes mellitus with diabetic peripheral angiopathy without gangrene: Secondary | ICD-10-CM | POA: Diagnosis present

## 2020-10-17 DIAGNOSIS — K5710 Diverticulosis of small intestine without perforation or abscess without bleeding: Secondary | ICD-10-CM | POA: Diagnosis present

## 2020-10-17 DIAGNOSIS — I255 Ischemic cardiomyopathy: Secondary | ICD-10-CM | POA: Diagnosis present

## 2020-10-17 DIAGNOSIS — Z22322 Carrier or suspected carrier of Methicillin resistant Staphylococcus aureus: Secondary | ICD-10-CM

## 2020-10-17 LAB — COMPREHENSIVE METABOLIC PANEL
ALT: 556 U/L — ABNORMAL HIGH (ref 0–44)
AST: 839 U/L — ABNORMAL HIGH (ref 15–41)
Albumin: 3.5 g/dL (ref 3.5–5.0)
Alkaline Phosphatase: 481 U/L — ABNORMAL HIGH (ref 38–126)
Anion gap: 13 (ref 5–15)
BUN: 29 mg/dL — ABNORMAL HIGH (ref 8–23)
CO2: 19 mmol/L — ABNORMAL LOW (ref 22–32)
Calcium: 7.6 mg/dL — ABNORMAL LOW (ref 8.9–10.3)
Chloride: 107 mmol/L (ref 98–111)
Creatinine, Ser: 1.64 mg/dL — ABNORMAL HIGH (ref 0.61–1.24)
GFR, Estimated: 43 mL/min — ABNORMAL LOW (ref >60)
Glucose, Bld: 135 mg/dL — ABNORMAL HIGH (ref 70–99)
Potassium: 3.6 mmol/L (ref 3.5–5.1)
Sodium: 139 mmol/L (ref 135–145)
Total Bilirubin: 1.8 mg/dL — ABNORMAL HIGH (ref 0.3–1.2)
Total Protein: 6.7 g/dL (ref 6.5–8.1)

## 2020-10-17 LAB — CBC WITH DIFFERENTIAL/PLATELET
Abs Immature Granulocytes: 0.06 10*3/uL (ref 0.00–0.07)
Basophils Absolute: 0.1 10*3/uL (ref 0.0–0.1)
Basophils Relative: 1 %
Eosinophils Absolute: 0.2 10*3/uL (ref 0.0–0.5)
Eosinophils Relative: 1 %
HCT: 36.4 % — ABNORMAL LOW (ref 39.0–52.0)
Hemoglobin: 11.9 g/dL — ABNORMAL LOW (ref 13.0–17.0)
Immature Granulocytes: 1 %
Lymphocytes Relative: 6 %
Lymphs Abs: 0.8 10*3/uL (ref 0.7–4.0)
MCH: 28.5 pg (ref 26.0–34.0)
MCHC: 32.7 g/dL (ref 30.0–36.0)
MCV: 87.3 fL (ref 80.0–100.0)
Monocytes Absolute: 0.5 10*3/uL (ref 0.1–1.0)
Monocytes Relative: 4 %
Neutro Abs: 11.6 10*3/uL — ABNORMAL HIGH (ref 1.7–7.7)
Neutrophils Relative %: 87 %
Platelets: 227 10*3/uL (ref 150–400)
RBC: 4.17 MIL/uL — ABNORMAL LOW (ref 4.22–5.81)
RDW: 14.2 % (ref 11.5–15.5)
WBC: 13.2 10*3/uL — ABNORMAL HIGH (ref 4.0–10.5)
nRBC: 0 % (ref 0.0–0.2)

## 2020-10-17 LAB — RESP PANEL BY RT-PCR (FLU A&B, COVID) ARPGX2
Influenza A by PCR: NEGATIVE
Influenza B by PCR: NEGATIVE
SARS Coronavirus 2 by RT PCR: NEGATIVE

## 2020-10-17 LAB — TROPONIN I (HIGH SENSITIVITY)
Troponin I (High Sensitivity): 15 ng/L (ref <18)
Troponin I (High Sensitivity): 19 ng/L — ABNORMAL HIGH (ref <18)

## 2020-10-17 LAB — LIPASE, BLOOD: Lipase: 79 U/L — ABNORMAL HIGH (ref 11–51)

## 2020-10-17 MED ORDER — ACETAMINOPHEN 325 MG PO TABS
650.0000 mg | ORAL_TABLET | Freq: Four times a day (QID) | ORAL | Status: DC | PRN
  Administered 2020-10-18 – 2020-10-24 (×3): 650 mg via ORAL
  Filled 2020-10-17 (×3): qty 2

## 2020-10-17 MED ORDER — ONDANSETRON HCL 4 MG/2ML IJ SOLN: 4.0000 mg | Freq: Four times a day (QID) | INTRAMUSCULAR | Status: DC | PRN

## 2020-10-17 MED ORDER — TRAZODONE HCL 50 MG PO TABS
25.0000 mg | ORAL_TABLET | Freq: Every evening | ORAL | Status: DC | PRN
  Administered 2020-10-18: 01:00:00 25 mg via ORAL

## 2020-10-17 MED ORDER — MOMETASONE FURO-FORMOTEROL FUM 200-5 MCG/ACT IN AERO
2.0000 | INHALATION_SPRAY | Freq: Two times a day (BID) | RESPIRATORY_TRACT | Status: DC
  Administered 2020-10-17 – 2020-10-24 (×15): 2 via RESPIRATORY_TRACT
  Filled 2020-10-17 (×2): qty 8.8

## 2020-10-17 MED ORDER — ALBUTEROL SULFATE (2.5 MG/3ML) 0.083% IN NEBU
2.5000 mg | INHALATION_SOLUTION | RESPIRATORY_TRACT | Status: DC | PRN
  Administered 2020-10-21 – 2020-10-24 (×10): 2.5 mg via RESPIRATORY_TRACT
  Filled 2020-10-17 (×10): qty 3

## 2020-10-17 MED ORDER — LAMOTRIGINE 25 MG PO TABS
25.0000 mg | ORAL_TABLET | Freq: Two times a day (BID) | ORAL | Status: DC
  Administered 2020-10-17 – 2020-10-25 (×14): 25 mg via ORAL
  Filled 2020-10-17 (×15): qty 1

## 2020-10-17 MED ORDER — FERROUS SULFATE 325 (65 FE) MG PO TABS
325.0000 mg | ORAL_TABLET | Freq: Every day | ORAL | Status: DC
  Administered 2020-10-20 – 2020-10-25 (×6): 325 mg via ORAL
  Filled 2020-10-17 (×7): qty 1

## 2020-10-17 MED ORDER — SODIUM CHLORIDE 0.9 % IV SOLN: INTRAVENOUS | Status: DC

## 2020-10-17 MED ORDER — LACTULOSE 10 GM/15ML PO SOLN: 30.0000 g | Freq: Two times a day (BID) | ORAL | Status: DC | PRN

## 2020-10-17 MED ORDER — ONDANSETRON HCL 4 MG/2ML IJ SOLN
4.0000 mg | Freq: Once | INTRAMUSCULAR | Status: AC
  Administered 2020-10-17: 19:00:00 4 mg via INTRAVENOUS

## 2020-10-17 MED ORDER — TRAZODONE HCL 50 MG PO TABS
25.0000 mg | ORAL_TABLET | Freq: Every evening | ORAL | Status: DC | PRN
  Administered 2020-10-18 – 2020-10-22 (×5): 25 mg via ORAL
  Filled 2020-10-17 (×6): qty 1

## 2020-10-17 MED ORDER — ACETAMINOPHEN 650 MG RE SUPP: 650.0000 mg | Freq: Four times a day (QID) | RECTAL | Status: DC | PRN

## 2020-10-17 MED ORDER — CARVEDILOL 3.125 MG PO TABS
3.1250 mg | ORAL_TABLET | Freq: Two times a day (BID) | ORAL | Status: DC
  Administered 2020-10-17: 23:00:00 3.125 mg via ORAL
  Filled 2020-10-17: qty 1

## 2020-10-17 MED ORDER — LORAZEPAM 0.5 MG PO TABS
0.5000 mg | ORAL_TABLET | Freq: Two times a day (BID) | ORAL | Status: DC | PRN
  Administered 2020-10-18 – 2020-10-25 (×4): 0.5 mg via ORAL
  Filled 2020-10-17 (×4): qty 1

## 2020-10-17 MED ORDER — DULOXETINE HCL 30 MG PO CPEP
30.0000 mg | ORAL_CAPSULE | Freq: Two times a day (BID) | ORAL | Status: DC
  Administered 2020-10-17: 23:00:00 30 mg via ORAL
  Filled 2020-10-17 (×3): qty 1

## 2020-10-17 MED ORDER — ONDANSETRON HCL 4 MG PO TABS
4.0000 mg | ORAL_TABLET | Freq: Four times a day (QID) | ORAL | Status: DC | PRN
  Administered 2020-10-20: 16:00:00 4 mg via ORAL
  Filled 2020-10-17: qty 1

## 2020-10-17 MED ORDER — SENNA 8.6 MG PO TABS
2.0000 | ORAL_TABLET | Freq: Every day | ORAL | Status: DC
  Administered 2020-10-17 – 2020-10-24 (×7): 17.2 mg via ORAL
  Filled 2020-10-17 (×7): qty 2

## 2020-10-17 MED ORDER — FENTANYL CITRATE (PF) 100 MCG/2ML IJ SOLN
50.0000 ug | Freq: Once | INTRAMUSCULAR | Status: AC
  Administered 2020-10-17: 19:00:00 50 ug via INTRAVENOUS
  Filled 2020-10-17: qty 2

## 2020-10-17 MED ORDER — LACTATED RINGERS IV BOLUS
1000.0000 mL | Freq: Once | INTRAVENOUS | Status: AC
  Administered 2020-10-17: 21:00:00 1000 mL via INTRAVENOUS

## 2020-10-17 MED ORDER — ISOSORBIDE MONONITRATE ER 30 MG PO TB24: 30.0000 mg | ORAL_TABLET | Freq: Every day | ORAL | Status: DC

## 2020-10-17 MED ORDER — ATORVASTATIN CALCIUM 20 MG PO TABS
80.0000 mg | ORAL_TABLET | Freq: Every day | ORAL | Status: DC
  Administered 2020-10-17 – 2020-10-24 (×8): 80 mg via ORAL
  Filled 2020-10-17 (×2): qty 4
  Filled 2020-10-17: qty 1
  Filled 2020-10-17 (×2): qty 4
  Filled 2020-10-17: qty 1
  Filled 2020-10-17: qty 4
  Filled 2020-10-17: qty 1

## 2020-10-17 MED ORDER — SACUBITRIL-VALSARTAN 24-26 MG PO TABS
1.0000 | ORAL_TABLET | Freq: Two times a day (BID) | ORAL | Status: DC
  Administered 2020-10-17: 23:00:00 1 via ORAL
  Filled 2020-10-17 (×2): qty 1

## 2020-10-17 MED ORDER — OCUVITE-LUTEIN PO CAPS
1.0000 | ORAL_CAPSULE | Freq: Two times a day (BID) | ORAL | Status: DC
  Administered 2020-10-18 – 2020-10-25 (×12): 1 via ORAL
  Filled 2020-10-17 (×18): qty 1

## 2020-10-17 MED ORDER — MAGNESIUM HYDROXIDE 400 MG/5ML PO SUSP: 30.0000 mL | Freq: Every day | ORAL | Status: DC | PRN

## 2020-10-17 MED ORDER — MONTELUKAST SODIUM 10 MG PO TABS
10.0000 mg | ORAL_TABLET | Freq: Every day | ORAL | Status: DC
  Administered 2020-10-17 – 2020-10-20 (×4): 10 mg via ORAL
  Filled 2020-10-17 (×4): qty 1

## 2020-10-17 MED ORDER — HYDROXYZINE HCL 25 MG PO TABS
25.0000 mg | ORAL_TABLET | Freq: Four times a day (QID) | ORAL | Status: DC | PRN
  Filled 2020-10-17: qty 1

## 2020-10-17 MED ORDER — MORPHINE SULFATE (PF) 4 MG/ML IV SOLN
4.0000 mg | Freq: Once | INTRAVENOUS | Status: AC
Start: 2020-10-17 — End: 2020-10-17
  Administered 2020-10-17: 21:00:00 4 mg via INTRAVENOUS
  Filled 2020-10-17: qty 1

## 2020-10-17 MED ORDER — MELATONIN 3 MG PO TABS
3.0000 mg | ORAL_TABLET | Freq: Every day | ORAL | Status: DC
  Filled 2020-10-17: qty 1

## 2020-10-17 MED ORDER — IOHEXOL 300 MG/ML  SOLN
75.0000 mL | Freq: Once | INTRAMUSCULAR | Status: AC | PRN
  Administered 2020-10-17: 20:00:00 75 mL via INTRAVENOUS

## 2020-10-17 MED ORDER — NITROGLYCERIN 0.4 MG SL SUBL: 0.4000 mg | SUBLINGUAL_TABLET | SUBLINGUAL | Status: DC | PRN

## 2020-10-17 MED ORDER — MELATONIN 5 MG PO TABS
2.5000 mg | ORAL_TABLET | Freq: Every day | ORAL | Status: DC
  Administered 2020-10-17: 2.5 mg via ORAL
  Filled 2020-10-17 (×2): qty 1

## 2020-10-17 MED ORDER — NICOTINE 14 MG/24HR TD PT24
14.0000 mg | MEDICATED_PATCH | Freq: Every day | TRANSDERMAL | Status: DC
  Administered 2020-10-18 – 2020-10-25 (×8): 14 mg via TRANSDERMAL
  Filled 2020-10-17 (×8): qty 1

## 2020-10-17 MED ORDER — TAMSULOSIN HCL 0.4 MG PO CAPS
0.4000 mg | ORAL_CAPSULE | Freq: Every day | ORAL | Status: DC
  Administered 2020-10-20 – 2020-10-25 (×6): 0.4 mg via ORAL
  Filled 2020-10-17 (×6): qty 1

## 2020-10-17 MED ORDER — PANTOPRAZOLE SODIUM 40 MG PO TBEC
40.0000 mg | DELAYED_RELEASE_TABLET | Freq: Every day | ORAL | Status: DC
  Filled 2020-10-17: qty 1

## 2020-10-17 MED ORDER — ALBUTEROL SULFATE HFA 108 (90 BASE) MCG/ACT IN AERS
2.0000 | INHALATION_SPRAY | Freq: Four times a day (QID) | RESPIRATORY_TRACT | Status: DC | PRN
  Filled 2020-10-17: qty 6.7

## 2020-10-17 MED ORDER — FUROSEMIDE 40 MG PO TABS: 80.0000 mg | ORAL_TABLET | Freq: Every day | ORAL | Status: DC

## 2020-10-17 MED ORDER — PIPERACILLIN-TAZOBACTAM 3.375 G IVPB 30 MIN
3.3750 g | Freq: Once | INTRAVENOUS | Status: AC
  Administered 2020-10-17: 21:00:00 3.375 g via INTRAVENOUS
  Filled 2020-10-17: qty 50

## 2020-10-17 NOTE — H&P (Addendum)
[name]   PATIENT NAME: Shawn Jennings    MR#:  [id number]  DATE OF BIRTH:  10/26/1945  DATE OF ADMISSION:  10/17/2020  PRIMARY CARE PHYSICIAN: [facility]   REQUESTING/REFERRING PHYSICIAN: Vladimir Crofts, MD CHIEF COMPLAINT:   Chief Complaint  Patient presents with  . Chest Pain    Chest pain since 1700    HISTORY OF PRESENT ILLNESS:  Shawn Jennings  is a 74 y.o. male with a known history of multiple medical problems that are mentioned below including coronary artery disease status post CABG, COPD, depression, hypertension and GERD as well as dyslipidemia and type 2 diabetes mellitus, who presented to the emergency room with occurrences of substernal chest pain with associated epigastric pain, nausea and vomiting once with no diarrhea.  He admitted to tactile fever however was not febrile here.  No dysuria, oliguria or hematuria or flank pain.  He denies any cough or wheezing or hemoptysis.  No other bleeding diathesis.  Upon presentation to the emergency room, blood pressure was 147/72 with otherwise normal vital signs but later respiratory it was 25.  CMP revealed a creatinine of 1.64. compared to 1.3 October,  alk phos of 481, AST of 839, ALT 556 and total bili 1.8.  CBC showed leukocytosis of 13.2 with neutrophilia and mild anemia.  High-sensitivity troponin I was 15.  Influenza antigens and COVID-19 PCR came back negative.  EKG showed atrial fibrillation with controlled rate response of 87.  Two-view chest x-ray showed no acute cardiopulmonary disease.  Okay abdominal and pelvic CT scan revealed the following: 1. Choledocholithiasis with 6 x 5 mm stone in the distal common bile duct. Mild intra and extrahepatic biliary ductal dilatation. 2. Periampullary duodenal diverticulum. 3. No CT findings of pancreatitis. 4. Equivocal wall thickening about the greater curvature of the stomach, can be seen with gastritis. 5. Advanced aortic and branch atherosclerosis.  Renal vascular calcifications versus possible nonobstructing stones in the left kidney. Occluded left superficial femoral artery stent is likely chronic. 6. 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 Findings favor bronchiolitis, recommend correlation for aspiration risk factors. Aortic Atherosclerosis  and Emphysema.  The patient was given IV Zosyn, IV fentanyl, 1 L bolus of IV lactated Ringer, 4 mg of IV Zofran and 4 mg of IV morphine sulfate.  He will be admitted to a medical monitored bed for further evaluation and management. PAST MEDICAL HISTORY:   Past Medical History:  Diagnosis Date  . Aortic stenosis    a. Pt unaware of history but CT imaging 01/2019 consistent w/ AVR; b. 01/2019 Echo: Mild to mod AS. Mean grad 48mHg.  .Marland KitchenCAD (coronary artery disease)    a. 1994 s/p CABG ([place]; b. Reports multiple stress tests over the years w/o repeat cath; c. 01/2019 MV: large, sev, fixed inf and inflat defect extending to apex. No ischemia. EF 37%-->Med rx given atypical Ss and pt wishes.  . CKD (chronic kidney disease), stage III ([facility]   . COPD (chronic obstructive pulmonary disease) ([place]   . Depression   . Essential hypertension   . GERD (gastroesophageal reflux disease)   . Hyperlipidemia LDL goal <70   . Ischemic cardiomyopathy    a. 01/2019 Echo: EF 40-45%, impaired relaxation. Mildly dil LA. Sev mitral annular Ca2+. Mild to mod AS.  .Marland KitchenPAD (peripheral artery disease) ([facility]    a. 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  .Marland KitchenPAF (paroxysmal atrial  fibrillation) ([place])    a. CHA2DS2VASc = 4-->Xarelto 88m daily in setting of CKD.  .Marland KitchenThyrotoxicosis   . Tobacco abuse   . Type II diabetes mellitus ([facility]     PAST SURGICAL HISTORY:   Past Surgical History:  Procedure Laterality Date  . CARDIAC CATHETERIZATION    . CHOLECYSTECTOMY    . CORONARY ARTERY BYPASS GRAFT     a. [place] . Left AKA    .  RIGHT/LEFT HEART CATH AND CORONARY ANGIOGRAPHY N/A 06/06/2020   Procedure: RIGHT/LEFT HEART CATH AND CORONARY ANGIOGRAPHY;  Surgeon: AWellington Hampshire MD;  [facility];  Service: Cardiovascular;  Laterality: N/A;    SOCIAL HISTORY:   Social History   Tobacco Use  . Smoking status: Current Every Day Smoker    Packs/day: 1.00    Years: 62.00    Pack years: 62.00  . Smokeless tobacco: Never Used  Substance Use Topics  . Alcohol use: Never    FAMILY HISTORY:   Family History  Problem Relation Age of Onset  . Heart attack Mother        died @ 888  . Other Father        never knew his father.  . Other Sister        overdose of sleeping pills.  . Heart disease Brother        died in his 62's . Other Sister        complication of abd surgery @ 274 . CAD Sister     DRUG ALLERGIES:  No Known Allergies  REVIEW OF SYSTEMS:   ROS As per history of present illness. All pertinent systems were reviewed above. Constitutional, HEENT, cardiovascular, respiratory, GI, GU, musculoskeletal, neuro, psychiatric, endocrine, integumentary and hematologic systems were reviewed and are otherwise negative/unremarkable except for positive findings mentioned above in the HPI.   MEDICATIONS AT HOME:   Prior to Admission medications   Medication Sig Start Date End Date Taking? Authorizing Provider  acetaminophen (TYLENOL) 500 MG tablet Take 500 mg by mouth every 6 (six) hours as needed.    [provider]  albuterol (PROVENTIL) (2.5 MG/3ML) 0.083% nebulizer solution Take 2.5 mg by nebulization every 4 (four) hours as needed for wheezing or shortness of breath.    [provider]  albuterol (VENTOLIN HFA) 108 (90 Base) MCG/ACT inhaler Inhale 2 puffs into the lungs every 6 (six) hours as needed for wheezing or shortness of breath.     [provider]  aspirin EC 81 MG tablet Take 81 mg by mouth daily.    [provider]  atorvastatin (LIPITOR)  80 MG tablet Take 80 mg by mouth at bedtime.    [provider]  carvedilol (COREG) 3.125 MG tablet Take 1 tablet (3.125 mg total) by mouth 2 (two) times daily. 06/17/20 09/15/20  WLoel Dubonnet NP  DULoxetine (CYMBALTA) 30 MG capsule Take 30 mg by mouth 2 (two) times daily.    [provider]  ferrous sulfate 325 (65 FE) MG tablet Take 325 mg by mouth daily.    [provider]  Fluticasone-Salmeterol (ADVAIR) 250-50 MCG/DOSE AEPB Inhale 1 puff into the lungs 2 (two) times daily.    [provider]  furosemide (LASIX) 80 MG tablet Take 1 tablet (80 mg total) once daily in the morning 06/20/20   WLoel Dubonnet NP  hydrOXYzine (ATARAX/VISTARIL) 25 MG tablet Take 25 mg by mouth every 6 (six) hours as  needed (allergy symptoms).    [provider]  isosorbide mononitrate (IMDUR) 30 MG 24 hr tablet Take 1 tablet (30 mg total) by mouth daily. 03/11/20 05/27/29  Sharen Hones, MD  ketotifen (ZADITOR) 0.025 % ophthalmic solution Place 1 drop into both eyes 2 (two) times daily as needed.     [provider]  lactulose (CHRONULAC) 10 GM/15ML solution Take 30 g by mouth every 12 (twelve) hours as needed for mild constipation.    [provider]  lamoTRIgine (LAMICTAL) 25 MG tablet Take 25 mg by mouth 2 (two) times daily.    [provider]  lidocaine (LIDODERM) 5 % Cut 1 patch in half and apply to amputated stump once daily 12 hours on, 12 hours off    [provider]  liver oil-zinc oxide (DESITIN) 40 % ointment Apply 1 application topically at bedtime as needed for irritation. To buttocks     [provider]  loperamide (IMODIUM) 2 MG capsule Take by mouth. 03/31/20   [provider]  LORazepam (ATIVAN) 0.5 MG tablet Take 0.5 mg by mouth 2 (two) times daily as needed for anxiety.    [provider]  Melatonin 3 MG TABS Take 1 tablet by mouth at bedtime.    [provider]  metFORMIN  (GLUCOPHAGE) 1000 MG tablet Take by mouth. 08/04/20   [provider]  metFORMIN (GLUCOPHAGE) 500 MG tablet Take 500 mg by mouth 2 (two) times daily with a meal.     [provider]  montelukast (SINGULAIR) 10 MG tablet Take 10 mg by mouth at bedtime.    [provider]  Multiple Vitamins-Minerals (PRESERVISION AREDS 2) CAPS Take 1 capsule by mouth 2 (two) times daily.    [provider]  nicotine (NICODERM CQ - DOSED IN MG/24 HOURS) 14 mg/24hr patch Place 14 mg onto the skin daily.    [provider]  nitroGLYCERIN (NITROSTAT) 0.4 MG SL tablet Place 0.4 mg under the tongue every 5 (five) minutes as needed for chest pain.    [provider]  pantoprazole (PROTONIX) 40 MG tablet Take 40 mg by mouth daily.    [provider]  potassium chloride SA (KLOR-CON) 20 MEQ tablet Take 1 tablet (20 mEq total) by mouth daily. 06/20/20 09/18/20  Loel Dubonnet, NP  Rivaroxaban (XARELTO) 15 MG TABS tablet Take 15 mg by mouth daily.    [provider]  sacubitril-valsartan (ENTRESTO) 24-26 MG Take 1 tablet by mouth 2 (two) times daily. 05/27/20   Loel Dubonnet, NP  senna (SENOKOT) 8.6 MG tablet Take 2 tablets by mouth at bedtime.    [provider]  tamsulosin (FLOMAX) 0.4 MG CAPS capsule Take 0.4 mg by mouth daily.    [provider]  traZODone (DESYREL) 50 MG tablet Take 25 mg by mouth at bedtime as needed for sleep.    [provider]  triamcinolone cream (KENALOG) 0.1 % Apply 1 application topically 2 (two) times daily.    [provider]      VITAL SIGNS:  Blood pressure (!) 147/72, pulse 72, temperature 98 F (36.7 C), temperature source Oral, resp. rate 18, height 6' (1.829 m), weight 72.6 kg, SpO2 100 %.  PHYSICAL EXAMINATION:  Physical Exam  GENERAL:  74 y.o.-year-old Caucasian male patient lying in the bed with no acute distress.  EYES: Pupils equal, round, reactive to light and  accommodation. No scleral icterus. Extraocular muscles intact.  HEENT: Head atraumatic, normocephalic. Oropharynx and nasopharynx  clear.  NECK:  Supple, no jugular venous distention. No thyroid enlargement, no tenderness.  LUNGS: Normal breath sounds bilaterally, no wheezing, rales,rhonchi or crepitation. No use of accessory muscles of respiration.  CARDIOVASCULAR: Regular rate and rhythm, S1, S2 normal. No murmurs, rubs, or gallops.  ABDOMEN: Soft, nondistended with mild epigastric and right upper quadrant tenderness without rebound tenderness guarding or rigidity..  Negative Murphy sign.  Bowel sounds present. No organomegaly or mass.  EXTREMITIES: No pedal edema, cyanosis, or clubbing.  NEUROLOGIC: Cranial nerves II through XII are intact. Muscle strength 5/5 in all extremities. Sensation intact. Gait not checked.  PSYCHIATRIC: The patient is alert and oriented x 3.  Normal affect and good eye contact. SKIN: No obvious rash, lesion, or ulcer.   LABORATORY PANEL:   CBC Recent Labs  Lab 10/17/20 1846  WBC 13.2*  HGB 11.9*  HCT 36.4*  PLT 227   ------------------------------------------------------------------------------------------------------------------  Chemistries  Recent Labs  Lab 10/17/20 1846  NA 139  K 3.6  CL 107  CO2 19*  GLUCOSE 135*  BUN 29*  CREATININE 1.64*  CALCIUM 7.6*  AST 839*  ALT 556*  ALKPHOS 481*  BILITOT 1.8*   ------------------------------------------------------------------------------------------------------------------  Cardiac Enzymes No results for input(s): TROPONINI in the last 168 hours. ------------------------------------------------------------------------------------------------------------------  RADIOLOGY:  DG Chest 2 View  Result Date: 10/17/2020 CLINICAL DATA:  Acute chest pain, status post CABG. EXAM: CHEST - 2 VIEW COMPARISON:  Chest x-ray 08/18/2020, chest x-ray 03/09/2020 FINDINGS: Surgical changes related to coronary  artery bypass. The heart size and mediastinal contours are unchanged. Status post aortic valve replacement. Aortic arch calcifications. Bibasilar compressive changes. No definite focal consolidation. No pulmonary edema. No pleural effusion. No pneumothorax. No acute osseous abnormality.  Old healed left clavicular fracture. IMPRESSION: No active cardiopulmonary disease. Electronically Signed   By: Iven Finn M.D.   On: 10/17/2020 18:45   CT ABDOMEN PELVIS W CONTRAST  Result Date: 10/17/2020 CLINICAL DATA:  Acute abdominal pain.  Pancreatitis.  Vomiting. EXAM: CT ABDOMEN AND PELVIS WITH CONTRAST TECHNIQUE: Multidetector CT imaging of the abdomen and pelvis was performed using the standard protocol following bolus administration of intravenous contrast. CONTRAST:  29m OMNIPAQUE IOHEXOL 300 MG/ML  SOLN COMPARISON:  None. FINDINGS: Lower chest: Prosthetic aortic valve. Coronary artery calcifications. Basilar emphysema. Ill-defined reticulonodular opacities in the dependent lower lobes. Areas of lower lobe mucous plugging. No pleural fluid. Hepatobiliary: Motion artifact through the liver. There is no focal hepatic lesion. Post cholecystectomy. r there is intra and extrahepatic biliary ductal dilatation with common bile duct spanning 13 mm at the porta hepatis. There is a 6 x 5 mm stone in the distal common bile duct, series 2, image 39. Pancreas: No peripancreatic fat stranding. No pancreatic ductal dilatation. No pancreatic mass or pseudocyst. There is motion artifact through the head and uncinate process. Spleen: Calcified granuloma.  Normal in size. Adrenals/Urinary Tract: Mild left adrenal thickening. No suspicious adrenal nodule. Symmetric bilateral perinephric edema. There is no hydronephrosis. Renal vascular calcifications versus possible nonobstructing stones in the left kidney. Subcentimeter low-density in the upper right kidney is likely cyst. Tiny low-density lesion in the mid left kidney is too  small to characterize. Both ureters are decompressed without stones along the course. Partially distended urinary bladder. Stomach/Bowel: Bowel evaluation is limited in the absence of enteric contrast, patient motion, and paucity of intra-abdominal fat. No abnormal gastric distension. Equivocal wall thickening about the greater curvature. There is a periampullary duodenal diverticulum without acute inflammation. Presumed enteric sutures in  the left upper quadrant with patulous loop of small bowel. No obstruction. No evidence of small bowel inflammation. Appendix normal. Mild colonic tortuosity. Small to moderate colonic stool burden. No colonic wall thickening or inflammatory change. Vascular/Lymphatic: Advanced aortic and branch atherosclerosis with mixed calcified and noncalcified atheromatous plaque. Suspected high-grade stenosis of the common iliac arteries, left greater than right. Left superficial femoral artery including stent appear occluded. Plaque throughout the visceral branches of the abdomen. No evidence of acute vascular abnormality. Portal vein is patent. No bulky abdominopelvic adenopathy. Reproductive: Prostatic calcifications. Other: No ascites, free air, or focal fluid collection. No abdominal wall hernia. Musculoskeletal: Osteopenia/osteoporosis. There are no acute or suspicious osseous abnormalities. IMPRESSION: 1. Choledocholithiasis with 6 x 5 mm stone in the distal common bile duct. Mild intra and extrahepatic biliary ductal dilatation. 2. Periampullary duodenal diverticulum. 3. No CT findings of pancreatitis. 4. Equivocal wall thickening about the greater curvature of the stomach, can be seen with gastritis. 5. Advanced aortic and branch atherosclerosis. Renal vascular calcifications versus possible nonobstructing stones in the left kidney. Occluded left superficial femoral artery stent is likely chronic. 6. Basilar emphysema with lower lobe bronchial wall thickening and ill-defined  reticulonodular opacities in the dependent lower lobes. Findings favor bronchiolitis, recommend correlation for aspiration risk factors. Aortic Atherosclerosis (ICD10-I70.0) and Emphysema (ICD10-J43.9). Electronically Signed   By: Keith Rake M.D.   On: 10/17/2020 20:31      IMPRESSION AND PLAN:   1.  Symptomatic choledocholithiasis with distal common bile duct obstructing stone and subsequent obstructive jaundice with elevated LFTs. -The patient will be admitted to a medical monitored bed. -Pain management will be provided. -He will be kept n.p.o. -We will follow LFTs and CBC as well as lipase that is currently 79. -GI consultation will be obtained. -I notified Dr. Haig Prophet about the patient. -Dr. Allen Norris will be available for ERCP on Tuesday.  2.  Acute kidney injury. -This likely hypovolemic secondary to dehydration, nausea and vomiting. -He will be hydrated with IV normal saline and will follow his BMP. -We will hold off nephrotoxins  3.  Essential hypertension. -We will continue Coreg and place the patient on as needed IV labetalol.  4.  Type 2 diabetes mellitus. -The patient will be placed on supplemental coverage with NovoLog and will hold off Metformin.  5.  Paroxysmal atrial fibrillation with controlled ventricular response. -We will hold off Xarelto for expectant ERCP.  6.  DVT prophylaxis. -We will hold off Xarelto for now and place the patient on subcutaneous Lovenox.   All the records are reviewed and case discussed with ED provider. The plan of care was discussed in details with the patient (and family). I answered all questions. The patient agreed to proceed with the above mentioned plan. Further management will depend upon hospital course.   CODE STATUS: Full code  Status is: Inpatient  Remains inpatient appropriate because:Ongoing active pain requiring inpatient pain management, Ongoing diagnostic testing needed not appropriate for outpatient work up,  Unsafe d/c plan, IV treatments appropriate due to intensity of illness or inability to take PO and Inpatient level of care appropriate due to severity of illness   Dispo: The patient is from: Home              Anticipated d/c is to: Home              Anticipated d/c date is: 3 days              Patient currently is not  medically stable to d/c.   TOTAL TIME TAKING CARE OF THIS PATIENT: 55 minutes.    Christel Mormon M.D on 10/17/2020 at 9:56 PM  [facility]   From 7 PM-7 AM, contact night-coverage [email or web address]  CC: Primary care physician; [facility]

## 2020-10-17 NOTE — ED Triage Notes (Signed)
Reported via [facility] for chest pain since approximately 1700. States that he had 3 nitroglycerin with no relief. Aspirin 1 hour ago with no relief.

## 2020-10-18 ENCOUNTER — Encounter: Payer: Self-pay | Admitting: Family Medicine

## 2020-10-18 ENCOUNTER — Inpatient Hospital Stay: Payer: Medicare Other

## 2020-10-18 DIAGNOSIS — A419 Sepsis, unspecified organism: Secondary | ICD-10-CM | POA: Diagnosis not present

## 2020-10-18 DIAGNOSIS — K851 Biliary acute pancreatitis without necrosis or infection: Secondary | ICD-10-CM

## 2020-10-18 DIAGNOSIS — E43 Unspecified severe protein-calorie malnutrition: Secondary | ICD-10-CM | POA: Insufficient documentation

## 2020-10-18 DIAGNOSIS — K8309 Other cholangitis: Secondary | ICD-10-CM

## 2020-10-18 DIAGNOSIS — K8050 Calculus of bile duct without cholangitis or cholecystitis without obstruction: Secondary | ICD-10-CM

## 2020-10-18 LAB — BLOOD GAS, ARTERIAL
Acid-base deficit: 5.8 mmol/L — ABNORMAL HIGH (ref 0.0–2.0)
Bicarbonate: 18.3 mmol/L — ABNORMAL LOW (ref 20.0–28.0)
FIO2: 0.28
O2 Saturation: 97.1 %
Patient temperature: 37
pCO2 arterial: 31 mmHg — ABNORMAL LOW (ref 32.0–48.0)
pH, Arterial: 7.38 (ref 7.350–7.450)
pO2, Arterial: 93 mmHg (ref 83.0–108.0)

## 2020-10-18 LAB — TROPONIN I (HIGH SENSITIVITY)
Troponin I (High Sensitivity): 29 ng/L — ABNORMAL HIGH (ref <18)
Troponin I (High Sensitivity): 40 ng/L — ABNORMAL HIGH (ref <18)
Troponin I (High Sensitivity): 43 ng/L — ABNORMAL HIGH (ref <18)

## 2020-10-18 LAB — CBC
HCT: 34.8 % — ABNORMAL LOW (ref 39.0–52.0)
HCT: 35 % — ABNORMAL LOW (ref 39.0–52.0)
Hemoglobin: 11.3 g/dL — ABNORMAL LOW (ref 13.0–17.0)
Hemoglobin: 11.7 g/dL — ABNORMAL LOW (ref 13.0–17.0)
MCH: 27.9 pg (ref 26.0–34.0)
MCH: 28.8 pg (ref 26.0–34.0)
MCHC: 32.5 g/dL (ref 30.0–36.0)
MCHC: 33.4 g/dL (ref 30.0–36.0)
MCV: 85.9 fL (ref 80.0–100.0)
MCV: 86.2 fL (ref 80.0–100.0)
Platelets: 212 10*3/uL (ref 150–400)
Platelets: 214 10*3/uL (ref 150–400)
RBC: 4.05 MIL/uL — ABNORMAL LOW (ref 4.22–5.81)
RBC: 4.06 MIL/uL — ABNORMAL LOW (ref 4.22–5.81)
RDW: 14.1 % (ref 11.5–15.5)
RDW: 14.4 % (ref 11.5–15.5)
WBC: 19.8 10*3/uL — ABNORMAL HIGH (ref 4.0–10.5)
WBC: 17.2 10*3/uL — ABNORMAL HIGH (ref 4.0–10.5)
nRBC: 0 % (ref 0.0–0.2)
nRBC: 0 % (ref 0.0–0.2)

## 2020-10-18 LAB — BASIC METABOLIC PANEL
Anion gap: 13 (ref 5–15)
Anion gap: 14 (ref 5–15)
Anion gap: 13 (ref 5–15)
BUN: 29 mg/dL — ABNORMAL HIGH (ref 8–23)
BUN: 27 mg/dL — ABNORMAL HIGH (ref 8–23)
BUN: 28 mg/dL — ABNORMAL HIGH (ref 8–23)
CO2: 22 mmol/L (ref 22–32)
CO2: 20 mmol/L — ABNORMAL LOW (ref 22–32)
CO2: 20 mmol/L — ABNORMAL LOW (ref 22–32)
Calcium: 7.2 mg/dL — ABNORMAL LOW (ref 8.9–10.3)
Calcium: 7.2 mg/dL — ABNORMAL LOW (ref 8.9–10.3)
Calcium: 6.8 mg/dL — ABNORMAL LOW (ref 8.9–10.3)
Chloride: 107 mmol/L (ref 98–111)
Chloride: 109 mmol/L (ref 98–111)
Chloride: 109 mmol/L (ref 98–111)
Creatinine, Ser: 1.71 mg/dL — ABNORMAL HIGH (ref 0.61–1.24)
Creatinine, Ser: 1.73 mg/dL — ABNORMAL HIGH (ref 0.61–1.24)
Creatinine, Ser: 1.77 mg/dL — ABNORMAL HIGH (ref 0.61–1.24)
GFR, Estimated: 41 mL/min — ABNORMAL LOW (ref >60)
GFR, Estimated: 40 mL/min — ABNORMAL LOW (ref >60)
GFR, Estimated: 39 mL/min — ABNORMAL LOW (ref >60)
Glucose, Bld: 178 mg/dL — ABNORMAL HIGH (ref 70–99)
Glucose, Bld: 120 mg/dL — ABNORMAL HIGH (ref 70–99)
Glucose, Bld: 176 mg/dL — ABNORMAL HIGH (ref 70–99)
Potassium: 3.1 mmol/L — ABNORMAL LOW (ref 3.5–5.1)
Potassium: 3.4 mmol/L — ABNORMAL LOW (ref 3.5–5.1)
Potassium: 3.1 mmol/L — ABNORMAL LOW (ref 3.5–5.1)
Sodium: 142 mmol/L (ref 135–145)
Sodium: 143 mmol/L (ref 135–145)
Sodium: 142 mmol/L (ref 135–145)

## 2020-10-18 LAB — URINALYSIS, COMPLETE (UACMP) WITH MICROSCOPIC
Bacteria, UA: NONE SEEN
Bilirubin Urine: NEGATIVE
Glucose, UA: NEGATIVE mg/dL
Hgb urine dipstick: NEGATIVE
Ketones, ur: NEGATIVE mg/dL
Leukocytes,Ua: NEGATIVE
Nitrite: NEGATIVE
Protein, ur: NEGATIVE mg/dL
Specific Gravity, Urine: 1.026 (ref 1.005–1.030)
pH: 5 (ref 5.0–8.0)

## 2020-10-18 LAB — CORTISOL-AM, BLOOD: Cortisol - AM: 31.1 ug/dL — ABNORMAL HIGH (ref 6.7–22.6)

## 2020-10-18 LAB — LACTIC ACID, PLASMA
Lactic Acid, Venous: 3.1 mmol/L (ref 0.5–1.9)
Lactic Acid, Venous: 2.3 mmol/L (ref 0.5–1.9)

## 2020-10-18 LAB — GLUCOSE, CAPILLARY
Glucose-Capillary: 170 mg/dL — ABNORMAL HIGH (ref 70–99)
Glucose-Capillary: 131 mg/dL — ABNORMAL HIGH (ref 70–99)
Glucose-Capillary: 153 mg/dL — ABNORMAL HIGH (ref 70–99)
Glucose-Capillary: 125 mg/dL — ABNORMAL HIGH (ref 70–99)
Glucose-Capillary: 144 mg/dL — ABNORMAL HIGH (ref 70–99)

## 2020-10-18 LAB — PROTIME-INR
INR: 1.5 — ABNORMAL HIGH (ref 0.8–1.2)
Prothrombin Time: 17.2 seconds — ABNORMAL HIGH (ref 11.4–15.2)

## 2020-10-18 LAB — MAGNESIUM
Magnesium: 0.8 mg/dL — CL (ref 1.7–2.4)
Magnesium: 3 mg/dL — ABNORMAL HIGH (ref 1.7–2.4)

## 2020-10-18 LAB — HEMOGLOBIN A1C
Hgb A1c MFr Bld: 6.3 % — ABNORMAL HIGH (ref 4.8–5.6)
Mean Plasma Glucose: 134.11 mg/dL

## 2020-10-18 LAB — PHOSPHORUS: Phosphorus: 3 mg/dL (ref 2.5–4.6)

## 2020-10-18 LAB — LIPASE, BLOOD: Lipase: 1097 U/L — ABNORMAL HIGH (ref 11–51)

## 2020-10-18 LAB — MRSA PCR SCREENING: MRSA by PCR: POSITIVE — AB

## 2020-10-18 LAB — PROCALCITONIN: Procalcitonin: 20.65 ng/mL

## 2020-10-18 MED ORDER — DULOXETINE HCL 30 MG PO CPEP
30.0000 mg | ORAL_CAPSULE | Freq: Two times a day (BID) | ORAL | Status: DC
  Administered 2020-10-19 – 2020-10-25 (×12): 30 mg via ORAL
  Filled 2020-10-18 (×13): qty 1

## 2020-10-18 MED ORDER — SODIUM CHLORIDE 0.9 % IV SOLN
250.0000 mL | INTRAVENOUS | Status: DC
  Administered 2020-10-18: 05:00:00 250 mL via INTRAVENOUS

## 2020-10-18 MED ORDER — MAGNESIUM SULFATE 4 GM/100ML IV SOLN
4.0000 g | Freq: Once | INTRAVENOUS | Status: AC
  Administered 2020-10-18: 15:00:00 4 g via INTRAVENOUS
  Filled 2020-10-18: qty 100

## 2020-10-18 MED ORDER — MORPHINE SULFATE (PF) 2 MG/ML IV SOLN
2.0000 mg | INTRAVENOUS | Status: DC | PRN
  Administered 2020-10-18: 2 mg via INTRAVENOUS
  Filled 2020-10-18: qty 1

## 2020-10-18 MED ORDER — LABETALOL HCL 5 MG/ML IV SOLN: 20.0000 mg | INTRAVENOUS | Status: DC | PRN

## 2020-10-18 MED ORDER — HYDROMORPHONE HCL 1 MG/ML IJ SOLN
0.5000 mg | INTRAMUSCULAR | Status: DC | PRN
  Administered 2020-10-18 – 2020-10-23 (×8): 0.5 mg via INTRAVENOUS
  Filled 2020-10-18 (×8): qty 1

## 2020-10-18 MED ORDER — CHLORHEXIDINE GLUCONATE CLOTH 2 % EX PADS
6.0000 | MEDICATED_PAD | Freq: Every day | CUTANEOUS | Status: AC
  Administered 2020-10-18 – 2020-10-22 (×5): 6 via TOPICAL

## 2020-10-18 MED ORDER — POTASSIUM CHLORIDE 10 MEQ/50ML IV SOLN
10.0000 meq | INTRAVENOUS | Status: AC
  Administered 2020-10-18 (×4): 10 meq via INTRAVENOUS
  Filled 2020-10-18 (×5): qty 50

## 2020-10-18 MED ORDER — VASOPRESSIN 20 UNITS/100 ML INFUSION FOR SHOCK
0.0000 [IU]/min | INTRAVENOUS | Status: DC
  Filled 2020-10-18: qty 100

## 2020-10-18 MED ORDER — SODIUM CHLORIDE 0.9 % IV BOLUS
1000.0000 mL | Freq: Once | INTRAVENOUS | Status: AC
  Administered 2020-10-18: 03:00:00 1000 mL via INTRAVENOUS

## 2020-10-18 MED ORDER — NOREPINEPHRINE 4 MG/250ML-% IV SOLN
2.0000 ug/min | INTRAVENOUS | Status: DC
  Administered 2020-10-18: 04:00:00 2 ug/min via INTRAVENOUS
  Filled 2020-10-18: qty 250

## 2020-10-18 MED ORDER — VANCOMYCIN HCL IN DEXTROSE 1-5 GM/200ML-% IV SOLN
1000.0000 mg | INTRAVENOUS | Status: DC
  Filled 2020-10-18: qty 200

## 2020-10-18 MED ORDER — VANCOMYCIN HCL 1500 MG/300ML IV SOLN
1500.0000 mg | Freq: Once | INTRAVENOUS | Status: AC
  Administered 2020-10-18: 05:00:00 1500 mg via INTRAVENOUS
  Filled 2020-10-18: qty 300

## 2020-10-18 MED ORDER — VANCOMYCIN HCL IN DEXTROSE 1-5 GM/200ML-% IV SOLN: 1000.0000 mg | Freq: Once | INTRAVENOUS | Status: DC

## 2020-10-18 MED ORDER — PANTOPRAZOLE SODIUM 40 MG IV SOLR
40.0000 mg | INTRAVENOUS | Status: DC
  Administered 2020-10-19 – 2020-10-24 (×6): 40 mg via INTRAVENOUS
  Filled 2020-10-18 (×6): qty 40

## 2020-10-18 MED ORDER — LIDOCAINE HCL 1 % IJ SOLN
5.0000 mL | Freq: Once | INTRAMUSCULAR | Status: AC
  Administered 2020-10-18: 09:00:00 5 mL via INTRADERMAL
  Filled 2020-10-18: qty 10

## 2020-10-18 MED ORDER — LACTATED RINGERS IV BOLUS (SEPSIS): 250.0000 mL | Freq: Once | INTRAVENOUS | Status: DC

## 2020-10-18 MED ORDER — INSULIN ASPART 100 UNIT/ML ~~LOC~~ SOLN
0.0000 [IU] | SUBCUTANEOUS | Status: DC
  Administered 2020-10-18: 21:00:00 1 [IU] via SUBCUTANEOUS
  Administered 2020-10-18: 12:00:00 2 [IU] via SUBCUTANEOUS
  Administered 2020-10-19: 09:00:00 1 [IU] via SUBCUTANEOUS
  Administered 2020-10-19: 22:00:00 2 [IU] via SUBCUTANEOUS
  Administered 2020-10-19: 5 [IU] via SUBCUTANEOUS
  Administered 2020-10-19 (×2): 2 [IU] via SUBCUTANEOUS
  Administered 2020-10-20 (×2): 3 [IU] via SUBCUTANEOUS
  Administered 2020-10-20: 12:00:00 7 [IU] via SUBCUTANEOUS
  Filled 2020-10-18 (×9): qty 1

## 2020-10-18 MED ORDER — MELATONIN 5 MG PO TABS
2.5000 mg | ORAL_TABLET | Freq: Every day | ORAL | Status: DC
  Administered 2020-10-19 – 2020-10-24 (×6): 2.5 mg via ORAL
  Filled 2020-10-18 (×6): qty 1

## 2020-10-18 MED ORDER — LACTATED RINGERS IV BOLUS
1000.0000 mL | Freq: Once | INTRAVENOUS | Status: AC
  Administered 2020-10-18: 12:00:00 1000 mL via INTRAVENOUS

## 2020-10-18 MED ORDER — LACTATED RINGERS IV BOLUS: 1000.0000 mL | Freq: Once | INTRAVENOUS | Status: DC

## 2020-10-18 MED ORDER — PIPERACILLIN-TAZOBACTAM 3.375 G IVPB
3.3750 g | Freq: Three times a day (TID) | INTRAVENOUS | Status: DC
  Administered 2020-10-18 – 2020-10-22 (×12): 3.375 g via INTRAVENOUS
  Filled 2020-10-18 (×13): qty 50

## 2020-10-18 MED ORDER — SODIUM CHLORIDE 0.9 % IV SOLN
250.0000 mL | INTRAVENOUS | Status: DC
  Administered 2020-10-18 – 2020-10-20 (×2): 250 mL via INTRAVENOUS

## 2020-10-18 MED ORDER — ENOXAPARIN SODIUM 40 MG/0.4ML ~~LOC~~ SOLN
40.0000 mg | SUBCUTANEOUS | Status: DC
  Administered 2020-10-18: 01:00:00 40 mg via SUBCUTANEOUS
  Filled 2020-10-18: qty 0.4

## 2020-10-18 MED ORDER — NOREPINEPHRINE 16 MG/250ML-% IV SOLN
0.0000 ug/min | INTRAVENOUS | Status: DC
  Administered 2020-10-18: 10:00:00 14 ug/min via INTRAVENOUS
  Filled 2020-10-18 (×2): qty 250

## 2020-10-18 MED ORDER — MUPIROCIN 2 % EX OINT
1.0000 "application " | TOPICAL_OINTMENT | Freq: Two times a day (BID) | CUTANEOUS | Status: AC
  Administered 2020-10-18 – 2020-10-22 (×10): 1 via NASAL
  Filled 2020-10-18: qty 22

## 2020-10-18 MED ORDER — MAGNESIUM SULFATE 2 GM/50ML IV SOLN
2.0000 g | Freq: Once | INTRAVENOUS | Status: AC
  Administered 2020-10-18: 17:00:00 2 g via INTRAVENOUS
  Filled 2020-10-18: qty 50

## 2020-10-18 MED ORDER — LACTATED RINGERS IV BOLUS (SEPSIS)
1000.0000 mL | Freq: Once | INTRAVENOUS | Status: AC
  Administered 2020-10-18: 05:00:00 1000 mL via INTRAVENOUS

## 2020-10-18 MED ORDER — LACTATED RINGERS IV BOLUS (SEPSIS)
1000.0000 mL | Freq: Once | INTRAVENOUS | Status: AC
  Administered 2020-10-18: 04:00:00 1000 mL via INTRAVENOUS

## 2020-10-18 MED ORDER — METRONIDAZOLE IN NACL 5-0.79 MG/ML-% IV SOLN: 500.0000 mg | Freq: Three times a day (TID) | INTRAVENOUS | Status: DC

## 2020-10-18 MED ORDER — HYDROMORPHONE HCL 1 MG/ML IJ SOLN
0.5000 mg | Freq: Once | INTRAMUSCULAR | Status: AC
  Administered 2020-10-18: 12:00:00 0.5 mg via INTRAVENOUS
  Filled 2020-10-18: qty 1

## 2020-10-18 MED ORDER — PANTOPRAZOLE SODIUM 40 MG IV SOLR: 40.0000 mg | INTRAVENOUS | Status: DC

## 2020-10-18 NOTE — Progress Notes (Signed)
E Ouma aware of critical labs and most recent vitals. Orders to transfer to stepdown, report called pt transferred via bed, pt agrees states no family to call.

## 2020-10-18 NOTE — Progress Notes (Signed)
Lactic Acid 3.1 reported to care nurse, Dorathy Daft, RN; Windy Carina, RN; 2:43 AM 10/18/2020

## 2020-10-18 NOTE — Consult Note (Signed)
NAME:  Shawn Jennings, MRN:  [id number], DOB:  June 19, 1946, LOS: 1 ADMISSION DATE:  10/17/2020, CONSULTATION DATE: 10/18/2020 REFERRING MD: Webb Silversmith, NP, CHIEF COMPLAINT: Chest Pain   Brief History:  74 yo male admitted with septic shock secondary choledocholithiasis with stone in the distal common bile duct requiring levophed gtt   History of Present Illness:  This is a 74 yo male who presented to [facility] ER on 12/28 with c/o chest pain which resolved after he took nitroglycerin, onset of chest pain 2 hrs prior to ER presentation.  Upon arrival to the ER pt reported 8/10 intensity severe nonradiating epigastric pain along with nausea/vomiting onset 2 weeks prior to presentation.  He had a LHC on 05/2020 with 100% stenosed circumflex, LAD, and RCA, with intact grafts.  ER lab results revealed K+ 3.4, CO2 20, glucose 120, BUN 27, creatinine 1.73, pct 20.65, wbc 17.2, hgb 11.7, and abg pH 7.38/pCO2 31/pO2 93/bicarb 18.3. MRSA PCR positive. CXR and COVID-19/Influenza PCR negative. CT Abd Pelvis revealed choledocholithiasis with 6 x 5 mm stone in the distal common bile duct.  Pt hypotensive sbp 70's and tachycardic hr 117 meeting sepsis criteria.  He received 3.2L LR bolus along with zosyn in the ER with initial improvement of bp. He was admitted to the [facility] unit per hospitalist team with gastroenterology and general surgery consults pending.  However, on 12/28 pt developed hypotension requiring transfer to the stepdown unit and levophed gtt initiated.  PCCM team consulted to assist with management.    Past Medical History:  Type II Diabetes Mellitus Tobacco Abuse Thyrotoxicosis Paroxysmal Atrial Fibrillation (on xarelto) PAD Ischemic Cardiomyopathy (Echo 04/2020 EF 25-30% bioprosthetic aortic valve worked appropriately) HLD GERD  Essential HTN Depression COPD Stage III CKD CAD s/p CABG 1994 Aortic Stenosis   Significant Hospital Events:  12/27: Pt admitted to the medsurg unit with  sepsis secondary to choledocholithiasis with stone in the distal common bile duct. 12/28: Pt with worsening hypotension requiring transfer to the stepdown unit for pressors   Consults:  Intensivist  Gastroenterology  General Surgery   Procedures:  Right IJ CVL 12/28>>  Significant Diagnostic Tests:  12/27: CT Abd Pelvis revealed choledocholithiasis with 6 x 5 mm stone in the distal common bile duct. Mild intra and extrahepatic biliary ductal dilatation. Periampullary duodenal diverticulum. No CT findings of pancreatitis. Equivocal wall thickening about the greater curvature of the stomach, can be seen with gastritis. Advanced aortic and branch atherosclerosis. Renal vascular calcifications versus possible nonobstructing stones in the left kidney. Occluded left superficial femoral artery stent is likely chronic. 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 Findings favor bronchiolitis, recommend correlation for aspiration risk factors  Micro Data:  COVID-19/Influenza PCR 12/27>>negative  MRSA PCR 12/28>>positive  Blood x2 12/28>> Urine 12/28>>  Antimicrobials:  Zosyn 12/27>> Vancomycin 12/28>>  Interim History / Subjective:  Pt alert/oriented c/o severe abdominal and back pain.  Objective   Blood pressure (!) 98/50, pulse 85, temperature 97.6 F (36.4 C), temperature source Oral, resp. rate (!) 26, height 6' (1.829 m), weight 72.6 kg, SpO2 98 %.        Intake/Output Summary (Last 24 hours) at 10/18/2020 0727 Last data filed at 10/18/2020 0700 Gross per 24 hour  Intake 1014.74 ml  Output --  Net 1014.74 ml   Filed Weights   10/17/20 1828  Weight: 72.6 kg    Examination: General: acutely ill appearing male resting in bed  HENT: supple,  mild JVD  Lungs: faint crackles throughout, even, non labored  Cardiovascular: irregular irregular, no R/G, 2+ radial, 1+ distal pulses, no edema   Abdomen: hypoactive BS x4, soft, tender, distended  Extremities: left BKA, normal tone, moves all extremities  Neuro: alert and oriented, follows commands, PERRLA  GU: condom cath intact draining yellow urine   Assessment & Plan:   Acute hypoxic respiratory failure secondary to mild central vascular congestion  Hx: COPD and tobacco abuse  Prn supplemental O2 for dyspnea and/or hypoxia  Scheduled and prn bronchodilator therapy  Smoking cessation counseling provided   Septic shock secondary to choledocholithiasis with 6 x 5 mm stone in the distal common bile duct  Mildly elevated troponin likely secondary to demand ischemia in the setting of sepsis Paroxysmal atrial fibrillation   Hx: Ischemic Cardiomyopathy (Echo 04/2020 EF 25-30% bioprosthetic aortic valve worked appropriately), Aortic Stenosis, PAD, and CAD s/p CABG 1994, Aortic Stenosis  Continuous telemetry monitoring  Trend troponin  Levophed and vasopressin gtt to maintain map >65 Hold outpatient antihypertensives  Gentile fluid resuscitation  Echo pending  Pt to undergo ERCP vs. Biliary drain placement: once procedure completed will start heparin gtt   Acute on chronic renal failure with compensated metabolic acidosis (stage III CKD) Hypokalemia  Lactic acidosis-improving  Trend BMP and lactic acid  Replace electrolytes as indicated  Monitor UOP Avoid nephrotoxic medications  Hold outpatient metformin for now   Sepsis secondary to choledocholithiasis with 6 x 5 mm stone in the distal common bile duct  Trend WBC and monitor fever curve  Continue antimicrobial therapy as outline above  Trend PCT  Follow cultures  Pt to undergo ERCP vs. Biliary drain placement GI and general surgery consulted appreciate input  Keep NPO for now   Anemia without obvious acute blood loss Trend CBC  Monitor for s/sx of bleeding and transfuse for hgb <7  Type II diabetes mellitus  CBG's q4hrs  SSI   Acute pain  Prn morphine for  pain management  Depression  Will restart antihypertensives once pt able to tolerate po's   Best practice (evaluated daily)  Diet: NPO for now  Pain/Anxiety/Delirium protocol (if indicated): prn morphine for pain management  VAP protocol (if indicated): not indicated  DVT prophylaxis: Will start heparin gtt post procedures  GI prophylaxis: iv protonix  Glucose control: SSI  Mobility: Bedrest for now Disposition: ICU   Goals of Care:  Last date of multidisciplinary goals of care discussion: 10/18/2020 Family and staff present: pt has no family Summary of discussion: updated pt regarding prognosis and plan of care  Follow up goals of care discussion due: 10/19/2020 Code Status: Full Code   Labs   CBC: Recent Labs  Lab 10/17/20 1846 10/18/20 0203 10/18/20 0453  WBC 13.2* 19.8* 17.2*  NEUTROABS 11.6*  --   --   HGB 11.9* 11.3* 11.7*  HCT 36.4* 34.8* 35.0*  MCV 87.3 85.9 86.2  PLT 227 212 [id number]    Basic Metabolic Panel: Recent Labs  Lab 10/17/20 1846 10/18/20 0203 10/18/20 0453  NA 139 142 143  K 3.6 3.1* 3.4*  CL 107 107 109  CO2 19* 22 20*  GLUCOSE 135* 178* 120*  BUN 29* 29* 27*  CREATININE 1.64* 1.71* 1.73*  CALCIUM 7.6* 7.2* 7.2*   GFR: Estimated Creatinine Clearance: 37.3 mL/min (A) (by C-G formula based on SCr of 1.73 mg/dL (H)). Recent Labs  Lab 10/17/20 1846 10/18/20 0203 10/18/20 0453  PROCALCITON  --   --  20.65  WBC 13.2* 19.8* 17.2*  LATICACIDVEN  --  3.1*  --     Liver Function Tests: Recent Labs  Lab 10/17/20 1846  AST 839*  ALT 556*  ALKPHOS 481*  BILITOT 1.8*  PROT 6.7  ALBUMIN 3.5   Recent Labs  Lab 10/17/20 1846 10/18/20 0212  LIPASE 79* 1,097*   No results for input(s): AMMONIA in the last 168 hours.  ABG    Component Value Date/Time   PHART 7.38 10/18/2020 0600   PCO2ART 31 (L) 10/18/2020 0600   PO2ART 93 10/18/2020 0600   HCO3 18.3 (L) 10/18/2020 0600   ACIDBASEDEF 5.8 (H) 10/18/2020 0600   O2SAT 97.1 10/18/2020  0600     Coagulation Profile: Recent Labs  Lab 10/18/20 0212  INR 1.5*    Cardiac Enzymes: No results for input(s): CKTOTAL, CKMB, CKMBINDEX, TROPONINI in the last 168 hours.  HbA1C: No results found for: HGBA1C  CBG: Recent Labs  Lab 10/18/20 0154  GLUCAP 170*    Review of Systems: Positives in BOLD   Gen: Denies fever, chills, weight change, fatigue, night sweats HEENT: Denies blurred vision, double vision, hearing loss, tinnitus, sinus congestion, rhinorrhea, sore throat, neck stiffness, dysphagia PULM: Denies shortness of breath, cough, sputum production, hemoptysis, wheezing CV: chest pain, edema, orthopnea, paroxysmal nocturnal dyspnea, palpitations GI: abdominal pain, nausea, vomiting, diarrhea, hematochezia, melena, constipation, change in bowel habits GU: Denies dysuria, hematuria, polyuria, oliguria, urethral discharge Endocrine: Denies hot or cold intolerance, polyuria, polyphagia or appetite change Derm: Denies rash, dry skin, scaling or peeling skin change Heme: Denies easy bruising, bleeding, bleeding gums Neuro: Denies headache, numbness, weakness, slurred speech, loss of memory or consciousness  Past Medical History:  He,  has a past medical history of Aortic stenosis, CAD (coronary artery disease), CKD (chronic kidney disease), stage III ([place]), COPD (chronic obstructive pulmonary disease) ([place]), Depression, Essential hypertension, GERD (gastroesophageal reflux disease), Hyperlipidemia LDL goal <70, Ischemic cardiomyopathy, PAD (peripheral artery disease) ([place]), PAF (paroxysmal atrial fibrillation) ([place]), Thyrotoxicosis, Tobacco abuse, and Type II diabetes mellitus ([place]).   Surgical History:   Past Surgical History:  Procedure Laterality Date  . CARDIAC CATHETERIZATION    . CHOLECYSTECTOMY    . CORONARY ARTERY BYPASS GRAFT     a. [place], [place]  . Left AKA    . RIGHT/LEFT HEART CATH AND CORONARY ANGIOGRAPHY N/A 06/06/2020   Procedure: RIGHT/LEFT HEART  CATH AND CORONARY ANGIOGRAPHY;  Surgeon: Wellington Hampshire, MD;  Location: [facility];  Service: Cardiovascular;  Laterality: N/A;     Social History:   reports that he has been smoking. He has a 62.00 pack-year smoking history. He has never used smokeless tobacco. He reports that he does not drink alcohol and does not use drugs.   Family History:  His family history includes CAD in his sister; Heart attack in his mother; Heart disease in his brother; Other in his father, sister, and sister.   Allergies No Known Allergies   Home Medications  Prior to Admission medications   Medication Sig Start Date End Date Taking? Authorizing Provider  acetaminophen (TYLENOL) 500 MG tablet Take 500 mg by mouth every 6 (six) hours as needed.    [provider]  albuterol (PROVENTIL) (2.5 MG/3ML) 0.083% nebulizer solution Take 2.5 mg by nebulization every 4 (four) hours as needed for wheezing or shortness of breath.    [provider]  albuterol (VENTOLIN HFA) 108 (90 Base) MCG/ACT inhaler Inhale 2 puffs into the lungs every 6 (six)  hours as needed for wheezing or shortness of breath.     [provider]  aspirin EC 81 MG tablet Take 81 mg by mouth daily.    [provider]  atorvastatin (LIPITOR) 80 MG tablet Take 80 mg by mouth at bedtime.    [provider]  carvedilol (COREG) 3.125 MG tablet Take 1 tablet (3.125 mg total) by mouth 2 (two) times daily. 06/17/20 09/15/20  Alver Sorrow, NP  DULoxetine (CYMBALTA) 30 MG capsule Take 30 mg by mouth 2 (two) times daily.    [provider]  ferrous sulfate 325 (65 FE) MG tablet Take 325 mg by mouth daily.    [provider]  Fluticasone-Salmeterol (ADVAIR) 250-50 MCG/DOSE AEPB Inhale 1 puff into the lungs 2 (two) times daily.    [provider]  furosemide (LASIX) 80 MG tablet Take 1 tablet (80 mg total) once daily in the morning 06/20/20   Alver Sorrow, NP  hydrOXYzine  (ATARAX/VISTARIL) 25 MG tablet Take 25 mg by mouth every 6 (six) hours as needed (allergy symptoms).    [provider]  isosorbide mononitrate (IMDUR) 30 MG 24 hr tablet Take 1 tablet (30 mg total) by mouth daily. 03/11/20 05/27/29  Marrion Coy, MD  ketotifen (ZADITOR) 0.025 % ophthalmic solution Place 1 drop into both eyes 2 (two) times daily as needed.     [provider]  lactulose (CHRONULAC) 10 GM/15ML solution Take 30 g by mouth every 12 (twelve) hours as needed for mild constipation.    [provider]  lamoTRIgine (LAMICTAL) 25 MG tablet Take 25 mg by mouth 2 (two) times daily.    [provider]  lidocaine (LIDODERM) 5 % Cut 1 patch in half and apply to amputated stump once daily 12 hours on, 12 hours off    [provider]  liver oil-zinc oxide (DESITIN) 40 % ointment Apply 1 application topically at bedtime as needed for irritation. To buttocks     [provider]  loperamide (IMODIUM) 2 MG capsule Take by mouth. 03/31/20   [provider]  LORazepam (ATIVAN) 0.5 MG tablet Take 0.5 mg by mouth 2 (two) times daily as needed for anxiety.    [provider]  Melatonin 3 MG TABS Take 1 tablet by mouth at bedtime.    [provider]  metFORMIN (GLUCOPHAGE) 1000 MG tablet Take by mouth. 08/04/20   [provider]  metFORMIN (GLUCOPHAGE) 500 MG tablet Take 500 mg by mouth 2 (two) times daily with a meal.     [provider]  montelukast (SINGULAIR) 10 MG tablet Take 10 mg by mouth at bedtime.    [provider]  Multiple Vitamins-Minerals (PRESERVISION AREDS 2) CAPS Take 1 capsule by mouth 2 (two) times daily.    [provider]  nicotine (NICODERM CQ - DOSED IN MG/24 HOURS) 14 mg/24hr patch Place 14 mg onto the skin daily.    [provider]  nitroGLYCERIN (NITROSTAT) 0.4 MG SL tablet Place 0.4 mg under the tongue every 5 (five) minutes as needed for chest pain.    [provider]  pantoprazole (PROTONIX) 40 MG tablet Take 40 mg by mouth daily.    [provider]  potassium chloride SA (KLOR-CON) 20 MEQ tablet Take 1 tablet (20 mEq total) by mouth daily. 06/20/20 09/18/20  Alver Sorrow, NP  Rivaroxaban (XARELTO) 15 MG TABS tablet Take 15 mg by mouth daily.    [provider]  sacubitril-valsartan (  ENTRESTO) 24-26 MG Take 1 tablet by mouth 2 (two) times daily. 05/27/20   Loel Dubonnet, NP  senna (SENOKOT) 8.6 MG tablet Take 2 tablets by mouth at bedtime.    [provider]  tamsulosin (FLOMAX) 0.4 MG CAPS capsule Take 0.4 mg by mouth daily.    [provider]  traZODone (DESYREL) 50 MG tablet Take 25 mg by mouth at bedtime as needed for sleep.    [provider]  triamcinolone cream (KENALOG) 0.1 % Apply 1 application topically 2 (two) times daily.    [provider]     Critical care time: 1 hr     Marda Stalker, [place] Pager [phone] (please enter 7 digits) PCCM Consult Pager [phone] (please enter 7 digits)

## 2020-10-18 NOTE — ED Provider Notes (Signed)
[facility] Emergency Department Provider Note ____________________________________________   Event Date/Time   First MD Initiated Contact with Patient 10/17/20 1801     (approximate)  I have reviewed the triage vital signs and the nursing notes.  HISTORY  Chief Complaint Chest Pain (Chest pain since 1700)   HPI Shawn Jennings is a 74 y.o. malewho presents to the ED for evaluation of chest pain.  Chart review indicates history of CAD s/p CABG 1994, chronic chest pain, HTN, HLD, reduced ejection fraction status post AVR, PAD s/p left AKA, DM, GERD. Last [facility] 05/2020 with 100% stenosed circumflex, LAD and RCA, with intact grafts.  Patient reports acute onset chest pain started a couple hours ago, resolved with nitroglycerin.  Patient reports the pain is reminiscent of his previous MIs and he is concerned about additional heart attack.  Reports associated nausea without emesis.  Denies recent fevers or illnesses.  Currently reporting 8/10 intensity severe epigastric pain that is nonradiating and aching.   Past Medical History:  Diagnosis Date  . Aortic stenosis    a. Pt unaware of history but CT imaging 01/2019 consistent w/ AVR; b. 01/2019 Echo: Mild to mod AS. Mean grad 18mmHg.  Marland Kitchen CAD (coronary artery disease)    a. 1994 s/p CABG ([place]); b. Reports multiple stress tests over the years w/o repeat cath; c. 01/2019 MV: large, sev, fixed inf and inflat defect extending to apex. No ischemia. EF 37%-->Med rx given atypical Ss and pt wishes.  . CKD (chronic kidney disease), stage III ([place])   . COPD (chronic obstructive pulmonary disease) ([place])   . Depression   . Essential hypertension   . GERD (gastroesophageal reflux disease)   . Hyperlipidemia LDL goal <70   . Ischemic cardiomyopathy    a. 01/2019 Echo: EF 40-45%, impaired relaxation. Mildly dil LA. Sev mitral annular Ca2+. Mild to mod AS.  Marland Kitchen PAD (peripheral artery disease) ([place])    a. 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  Marland Kitchen  PAF (paroxysmal atrial fibrillation) (HCC)    a. CHA2DS2VASc = 4-->Xarelto 15mg  daily in setting of CKD.  Marland Kitchen Thyrotoxicosis   . Tobacco abuse   . Type II diabetes mellitus [facility])     Patient Active Problem List   Diagnosis Date Noted  . Choledocholithiasis 10/17/2020  . Coronary artery disease of native artery of native heart with stable angina pectoris ([place])   . Ischemic cardiomyopathy   . Other chest pain 03/10/2020  . Chronic kidney disease, stage 3a ([place]) 03/09/2020  . Essential hypertension 03/09/2020  . Chest pain 01/27/2019  . Renal insufficiency   . Nonspecific chest pain 01/26/2019    Past Surgical History:  Procedure Laterality Date  . CARDIAC CATHETERIZATION    . CHOLECYSTECTOMY    . CORONARY ARTERY BYPASS GRAFT     a. [place], [place]  . Left AKA    . RIGHT/LEFT HEART CATH AND CORONARY ANGIOGRAPHY N/A 06/06/2020   Procedure: RIGHT/LEFT HEART CATH AND CORONARY ANGIOGRAPHY;  Surgeon: Wellington Hampshire, MD;  Location: [facility];  Service: Cardiovascular;  Laterality: N/A;    Prior to Admission medications   Medication Sig Start Date End Date Taking? Authorizing Provider  acetaminophen (TYLENOL) 500 MG tablet Take 500 mg by mouth every 6 (six) hours as needed.    [provider]  albuterol (PROVENTIL) (2.5 MG/3ML) 0.083% nebulizer solution Take 2.5 mg by nebulization every 4 (four) hours as needed for wheezing or shortness of breath.    [provider]  albuterol (VENTOLIN HFA) 108 (90 Base) MCG/ACT inhaler Inhale 2 puffs into the lungs every 6 (six) hours as needed for wheezing or shortness of breath.     [provider]  aspirin EC 81 MG tablet Take 81 mg by mouth daily.    [provider]  atorvastatin (LIPITOR) 80 MG tablet Take 80 mg by mouth at bedtime.    [provider]  carvedilol (COREG) 3.125 MG tablet Take 1 tablet (3.125 mg total) by mouth 2 (two) times daily. 06/17/20 09/15/20  Loel Dubonnet, NP   DULoxetine (CYMBALTA) 30 MG capsule Take 30 mg by mouth 2 (two) times daily.    [provider]  ferrous sulfate 325 (65 FE) MG tablet Take 325 mg by mouth daily.    [provider]  Fluticasone-Salmeterol (ADVAIR) 250-50 MCG/DOSE AEPB Inhale 1 puff into the lungs 2 (two) times daily.    [provider]  furosemide (LASIX) 80 MG tablet Take 1 tablet (80 mg total) once daily in the morning 06/20/20   Loel Dubonnet, NP  hydrOXYzine (ATARAX/VISTARIL) 25 MG tablet Take 25 mg by mouth every 6 (six) hours as needed (allergy symptoms).    [provider]  isosorbide mononitrate (IMDUR) 30 MG 24 hr tablet Take 1 tablet (30 mg total) by mouth daily. 03/11/20 05/27/29  Sharen Hones, MD  ketotifen (ZADITOR) 0.025 % ophthalmic solution Place 1 drop into both eyes 2 (two) times daily as needed.     [provider]  lactulose (CHRONULAC) 10 GM/15ML solution Take 30 g by mouth every 12 (twelve) hours as needed for mild constipation.    [provider]  lamoTRIgine (LAMICTAL) 25 MG tablet Take 25 mg by mouth 2 (two) times daily.    [provider]  lidocaine (LIDODERM) 5 % Cut 1 patch in half and apply to amputated stump once daily 12 hours on, 12 hours off    [provider]  liver oil-zinc oxide (DESITIN) 40 % ointment Apply 1 application topically at bedtime as needed for irritation. To buttocks     [provider]  loperamide (IMODIUM) 2 MG capsule Take by mouth. 03/31/20   [provider]  LORazepam (ATIVAN) 0.5 MG tablet Take 0.5 mg by mouth 2 (two) times daily as needed for anxiety.    [provider]  Melatonin 3 MG TABS Take 1 tablet by mouth at bedtime.    [provider]  metFORMIN (GLUCOPHAGE) 1000 MG tablet Take by mouth. 08/04/20   [provider]  metFORMIN (GLUCOPHAGE) 500 MG tablet Take 500 mg by mouth 2 (two) times daily with a meal.     [provider]  montelukast  (SINGULAIR) 10 MG tablet Take 10 mg by mouth at bedtime.    [provider]  Multiple Vitamins-Minerals (PRESERVISION AREDS 2) CAPS Take 1 capsule by mouth 2 (two) times daily.    [provider]  nicotine (NICODERM CQ - DOSED IN MG/24 HOURS) 14 mg/24hr patch Place 14 mg onto the skin daily.    [provider]  nitroGLYCERIN (NITROSTAT) 0.4 MG SL tablet Place 0.4 mg under the tongue every 5 (five) minutes as needed for chest pain.    [provider]  pantoprazole (PROTONIX) 40 MG tablet Take 40 mg by mouth daily.    [provider]  potassium chloride SA (KLOR-CON) 20 MEQ tablet Take 1 tablet (20 mEq total) by mouth daily. 06/20/20 09/18/20  Loel Dubonnet, NP  Rivaroxaban (XARELTO) 15 MG TABS tablet Take 15 mg by mouth daily.    [provider]  sacubitril-valsartan (ENTRESTO) 24-26 MG Take 1 tablet by mouth 2 (two) times daily. 05/27/20   Loel Dubonnet, NP  senna (SENOKOT) 8.6 MG tablet Take 2 tablets by mouth at bedtime.    [provider]  tamsulosin (FLOMAX) 0.4 MG CAPS capsule Take 0.4 mg by mouth daily.    [provider]  traZODone (DESYREL) 50 MG tablet Take 25 mg by mouth at bedtime as needed for sleep.    [provider]  triamcinolone cream (KENALOG) 0.1 % Apply 1 application topically 2 (two) times daily.    [provider]    Allergies Patient has no known allergies.  Family History  Problem Relation Age of Onset  . Heart attack Mother        died @ 69.  . Other Father        never knew his father.  . Other Sister        overdose of sleeping pills.  . Heart disease Brother        died in his 20's  . Other Sister        complication of abd surgery @ 3  . CAD Sister     Social History Social History   Tobacco Use  . Smoking status: Current Every Day Smoker    Packs/day: 1.00    Years: 62.00    Pack years: 62.00  . Smokeless tobacco: Never Used  Vaping Use  . Vaping Use:  Never used  Substance Use Topics  . Alcohol use: Never  . Drug use: Never    Review of Systems  Constitutional: No fever/chills Eyes: No visual changes. ENT: No sore throat. Cardiovascular: Positive for chest pain. Respiratory: Denies shortness of breath. Gastrointestinal: Positive epigastric pain and nausea no vomiting.  No diarrhea.  No constipation. Genitourinary: Negative for dysuria. Musculoskeletal: Negative for back pain. Skin: Negative for rash. Neurological: Negative for headaches, focal weakness or numbness.  ____________________________________________   PHYSICAL EXAM:  VITAL SIGNS: Vitals:   10/17/20 2300 10/17/20 2330  BP: 113/61 109/64  Pulse: 100 (!) 112  Resp: (!) 25 (!) 24  Temp:    SpO2: 94% 93%     Constitutional: Alert and oriented. Appears uncomfortable clutching his epigastrium Eyes: Conjunctivae are normal. PERRL. EOMI. Head: Atraumatic. Nose: No congestion/rhinnorhea. Mouth/Throat: Mucous membranes are moist.  Oropharynx non-erythematous. Neck: No stridor. No cervical spine tenderness to palpation. Cardiovascular: Normal rate, regular rhythm. Grossly normal heart sounds.  Good peripheral circulation. Respiratory: Normal respiratory effort.  No retractions. Lungs CTAB. Gastrointestinal: Soft , nondistended,. No CVA tenderness. Epigastric tenderness without peritoneal features Musculoskeletal: No lower extremity tenderness nor edema.  No joint effusions. No signs of acute trauma. Neurologic:  Normal speech and language. No gross focal neurologic deficits are appreciated. No gait instability noted. Skin:  Skin is warm, dry and intact. No rash noted. Psychiatric: Mood and affect are normal. Speech and behavior are normal.  ____________________________________________   LABS (all labs ordered are listed, but only abnormal results are displayed)  Labs Reviewed  CBC WITH DIFFERENTIAL/PLATELET - Abnormal; Notable for the following components:       Result Value   WBC 13.2 (*)    RBC 4.17 (*)    Hemoglobin 11.9 (*)    HCT 36.4 (*)    Neutro Abs 11.6 (*)    All other components within normal limits  COMPREHENSIVE METABOLIC PANEL -  Abnormal; Notable for the following components:   CO2 19 (*)    Glucose, Bld 135 (*)    BUN 29 (*)    Creatinine, Ser 1.64 (*)    Calcium 7.6 (*)    AST 839 (*)    ALT 556 (*)    Alkaline Phosphatase 481 (*)    Total Bilirubin 1.8 (*)    GFR, Estimated 43 (*)    All other components within normal limits  LIPASE, BLOOD - Abnormal; Notable for the following components:   Lipase 79 (*)    All other components within normal limits  TROPONIN I (HIGH SENSITIVITY) - Abnormal; Notable for the following components:   Troponin I (High Sensitivity) 19 (*)    All other components within normal limits  RESP PANEL BY RT-PCR (FLU A&B, COVID) ARPGX2  BASIC METABOLIC PANEL  CBC  TROPONIN I (HIGH SENSITIVITY)   ____________________________________________  12 Lead EKG Sinus rhythm, rate of 87 bpm.  Normal axis.  First-degree AV block with PR interval of 226 ms.  No STEMI  ____________________________________________  RADIOLOGY  ED MD interpretation: 2 view CXR reviewed by me without evidence of acute cardiopulmonary pathology. CT abdomen/pelvis reviewed by me with evidence of choledocholithiasis  Official radiology report(s): DG Chest 2 View  Result Date: 10/17/2020 CLINICAL DATA:  Acute chest pain, status post CABG. 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 The heart size and mediastinal contours are unchanged. Status post aortic valve replacement. Aortic arch calcifications. Bibasilar compressive changes. No definite focal consolidation. No pulmonary edema. No pleural effusion. No pneumothorax. No acute osseous abnormality.  Old healed left clavicular fracture. IMPRESSION: No active cardiopulmonary  disease. Electronically Signed   By: Tish Frederickson M.D.   On: 10/17/2020 18:45   CT ABDOMEN PELVIS W CONTRAST  Result Date: 10/17/2020 CLINICAL DATA:  Acute abdominal pain.  Pancreatitis.  Vomiting. EXAM: CT ABDOMEN AND PELVIS WITH CONTRAST TECHNIQUE: Multidetector CT imaging of the abdomen and pelvis was performed using the standard protocol following bolus administration of intravenous contrast. CONTRAST:  28mL OMNIPAQUE IOHEXOL 300 MG/ML  SOLN COMPARISON:  None. FINDINGS: Lower chest: Prosthetic aortic valve. Coronary artery calcifications. Basilar emphysema. Ill-defined reticulonodular opacities in the dependent lower lobes. Areas of lower lobe mucous plugging. No pleural fluid. Hepatobiliary: Motion artifact through the liver. There is no focal hepatic lesion. Post cholecystectomy. r there is intra and extrahepatic biliary ductal dilatation with common bile duct spanning 13 mm at the porta hepatis. There is a 6 x 5 mm stone in the distal common bile duct, series 2, image 39. Pancreas: No peripancreatic fat stranding. No pancreatic ductal dilatation. No pancreatic mass or pseudocyst. There is motion artifact through the head and uncinate process. Spleen: Calcified granuloma.  Normal in size. Adrenals/Urinary Tract: Mild left adrenal thickening. No suspicious adrenal nodule. Symmetric bilateral perinephric edema. There is no hydronephrosis. Renal vascular calcifications versus possible nonobstructing stones in the left kidney. Subcentimeter low-density in the upper right kidney is likely cyst. Tiny low-density lesion in the mid left kidney is too small to characterize. Both ureters are decompressed without stones along the course. Partially distended urinary bladder. Stomach/Bowel: Bowel evaluation is limited in the absence of enteric contrast, patient motion, and paucity of intra-abdominal fat. No abnormal gastric distension. Equivocal wall thickening about the greater curvature. There is a periampullary  duodenal diverticulum without acute inflammation. Presumed enteric sutures in the left upper quadrant with patulous loop  of small bowel. No obstruction. No evidence of small bowel inflammation. Appendix normal. Mild colonic tortuosity. Small to moderate colonic stool burden. No colonic wall thickening or inflammatory change. Vascular/Lymphatic: Advanced aortic and branch atherosclerosis with mixed calcified and noncalcified atheromatous plaque. Suspected high-grade stenosis of the common iliac arteries, left greater than right. Left superficial femoral artery including stent appear occluded. Plaque throughout the visceral branches of the abdomen. No evidence of acute vascular abnormality. Portal vein is patent. No bulky abdominopelvic adenopathy. Reproductive: Prostatic calcifications. Other: No ascites, free air, or focal fluid collection. No abdominal wall hernia. Musculoskeletal: Osteopenia/osteoporosis. There are no acute or suspicious osseous abnormalities. IMPRESSION: 1. Choledocholithiasis with 6 x 5 mm stone in the distal common bile duct. Mild intra and extrahepatic biliary ductal dilatation. 2. Periampullary duodenal diverticulum. 3. No CT findings of pancreatitis. 4. Equivocal wall thickening about the greater curvature of the stomach, can be seen with gastritis. 5. Advanced aortic and branch atherosclerosis. Renal vascular calcifications versus possible nonobstructing stones in the left kidney. Occluded left superficial femoral artery stent is likely chronic. 6. 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 Findings favor bronchiolitis, recommend correlation for aspiration risk factors. Aortic Atherosclerosis (ICD10-I70.0) and Emphysema (ICD10-J43.9). Electronically Signed   By: Keith Rake M.D.   On: 10/17/2020 20:31    ____________________________________________   PROCEDURES and  INTERVENTIONS  Procedure(s) performed (including Critical Care):  .1-3 Lead EKG Interpretation Performed by: Vladimir Crofts, MD Authorized by: Vladimir Crofts, MD     Interpretation: normal     ECG rate:  76   ECG rate assessment: normal     Rhythm: sinus rhythm     Ectopy: none     Conduction: normal       Medications  atorvastatin (LIPITOR) tablet 80 mg (80 mg Oral Given 10/17/20 2326)  carvedilol (COREG) tablet 3.125 mg (3.125 mg Oral Given 10/17/20 2329)  furosemide (LASIX) tablet 80 mg (has no administration in time range)  isosorbide mononitrate (IMDUR) 24 hr tablet 30 mg (has no administration in time range)  nitroGLYCERIN (NITROSTAT) SL tablet 0.4 mg (has no administration in time range)  sacubitril-valsartan (ENTRESTO) 24-26 mg per tablet (1 tablet Oral Given 10/17/20 2326)  DULoxetine (CYMBALTA) DR capsule 30 mg (30 mg Oral Given 10/17/20 2326)  hydrOXYzine (ATARAX/VISTARIL) tablet 25 mg (has no administration in time range)  LORazepam (ATIVAN) tablet 0.5 mg (has no administration in time range)  nicotine (NICODERM CQ - dosed in mg/24 hours) patch 14 mg (has no administration in time range)  traZODone (DESYREL) tablet 25 mg (has no administration in time range)  lactulose (CHRONULAC) 10 GM/15ML solution 30 g (has no administration in time range)  pantoprazole (PROTONIX) EC tablet 40 mg (has no administration in time range)  senna (SENOKOT) tablet 17.2 mg (17.2 mg Oral Given 10/17/20 2329)  tamsulosin (FLOMAX) capsule 0.4 mg (has no administration in time range)  ferrous sulfate tablet 325 mg (has no administration in time range)  lamoTRIgine (LAMICTAL) tablet 25 mg (25 mg Oral Given 10/17/20 2328)  multivitamin-lutein (OCUVITE-LUTEIN) capsule 1 capsule (has no administration in time range)  albuterol (PROVENTIL) (2.5 MG/3ML) 0.083% nebulizer solution 2.5 mg (has no administration in time range)  albuterol (VENTOLIN HFA) 108 (90 Base) MCG/ACT inhaler 2 puff (has no  administration in time range)  mometasone-formoterol (DULERA) 200-5 MCG/ACT inhaler 2 puff (2 puffs Inhalation Given 10/17/20 2330)  montelukast (SINGULAIR) tablet 10 mg (10 mg Oral Given 10/17/20 2328)  0.9 %  sodium chloride infusion (has no administration in time range)  acetaminophen (TYLENOL) tablet 650 mg (has no administration in time range)    Or  acetaminophen (TYLENOL) suppository 650 mg (has no administration in time range)  traZODone (DESYREL) tablet 25 mg (has no administration in time range)  magnesium hydroxide (MILK OF MAGNESIA) suspension 30 mL (has no administration in time range)  ondansetron (ZOFRAN) tablet 4 mg (has no administration in time range)    Or  ondansetron (ZOFRAN) injection 4 mg (has no administration in time range)  melatonin tablet 2.5 mg (2.5 mg Oral Given 10/17/20 2330)  fentaNYL (SUBLIMAZE) injection 50 mcg (50 mcg Intravenous Given 10/17/20 1838)  ondansetron (ZOFRAN) injection 4 mg (4 mg Intravenous Given 10/17/20 1906)  lactated ringers bolus 1,000 mL (1,000 mLs Intravenous New Bag/Given 10/17/20 2115)  iohexol (OMNIPAQUE) 300 MG/ML solution 75 mL (75 mLs Intravenous Contrast Given 10/17/20 2009)  morphine 4 MG/ML injection 4 mg (4 mg Intravenous Given 10/17/20 2116)  piperacillin-tazobactam (ZOSYN) IVPB 3.375 g (0 g Intravenous Stopped 10/17/20 2229)    ____________________________________________   MDM / ED COURSE   75 year old male with cardiac history presents to the ED with acute epigastric pain, most consistent with choledocholithiasis, requiring medical admission.  Hemodynamically stable.  Exam with epigastric pain and tenderness without peritoneal features in an uncomfortable-appearing patient.  Blood work with transaminitis and evidence of biliary obstruction, concerning for a GI etiology of his symptoms more so than cardiac.  His EKG is nonischemic and troponins are low.  CXR without cardiopulmonary pathology.  CT imaging obtained and  demonstrates evidence of choledocholithiasis.  Discussed the case with GI, who recommends broad-spectrum antibiotics and medical admission.   Clinical Course as of 10/18/20 0003  Mon Oct 17, 2020  1906 Sudden projectile vomiting.  Zofran provided. [DS]  2040 CT noted, GI paged [DS]  2040 Immediate callback from Dr. Haig Prophet, He will message Dr. Allen Norris to see if he is available for ERCP this week [DS]  Quick call back that Dr. Kennyth Arnold is available for ERCP tomorrow.  Recommending broad-spectrum antibiotics and medical admission.    Clinical Course User Index [DS] Vladimir Crofts, MD    ____________________________________________   FINAL CLINICAL IMPRESSION(S) / ED DIAGNOSES  Final diagnoses:  Choledocholithiasis     ED Discharge Orders    None       Loa Idler Tamala Julian   Note:  This document was prepared using Dragon voice recognition software and may include unintentional dictation errors.   Vladimir Crofts, MD 10/18/20 [phone]

## 2020-10-18 NOTE — Progress Notes (Signed)
D/w Dr Orpah Melter, [facility] Hives as patient is on pressors. He has graciously accepted the patient as an attending. Will sign off.   I had notified IR Dr Pascal Lux to discuss with Dr Allen Norris to sort out PTC drain vs ERCP.

## 2020-10-18 NOTE — Progress Notes (Addendum)
BRIEF OVERNIGHT PROGRESS REPORT   SUBJECTIVE: Rapid response initiated for worsening abdominal pain (RUQ), hypotension, tachypnea and lethargy  OBJECTIVE:On bedside assesment, he was afebrile with blood pressure 67/46 mm Hg and pulse rate 89 beats/min. There were no focal neurological deficits; he was alert but lethargic and c/o abdominal and chest pain.  Antimicrobials this admission: 12/27 Zosyn >>  12/28 Vancomycin >>   Microbiology results: 12/28: BCx: pending 12/28: UCx: pending 12/27: MRSA PCR: positive  ASSESSMENT & PLAN:  74 y.o male with PMH significant for CAD s/p CABG, Ischemic Cardiomyopathy, HFrEF, ASA s/p AVR, CKD stage lll, GERD, COPD,HLD, HTN, PAD s/p L AKA (2016), PAF on Xarelto?, Thyrotoxicosis, Tobacco abuse and Type ll DM admitted with choledocholithiasis and AKI now found to be septic.  # Sepsis with septic shock - suspect ?Ascending Cholangitis secondary to Choledocholithiasis. Now with worsening RUQ pain and refractroy hypotension requiring vasopressor, worsening leukocytosis and lactate on repeat labs. -Transfer to ICU -Check procalcitonin -Check ABG due to worsening lethargy -Blood and Urine cultures pending -Received Zosyn x 1 in the ED. Will start empiric with Zosyn and Vancomycin pending cultures as above. -Start Levophed keep MAP>56mHg or SBP >911mg -IVFs and PRN bolus  -Consult GI by admitting team for ERCP today?GI notified by secure chat of worsening symptoms. -Consult to IR for PTC for source control and biliary decompression.  -PCCM consult for management   # AKI on CKD Stage III - Cr worsening on repeat labs -Monitor I&O's / urinary output -Follow BMP -Ensure adequate renal perfusion -Avoid nephrotoxic agents as able -Replace electrolytes as indicated  # Atrial Fibrillation  - On Xarelto. Hold for possible procedure  # COPD without evidence of acute exacerbation -Supplemental O2 as needed to maintain O2 saturations 88 to  92% -As needed bronchodilators -Encourage smoking cessation   # ICM/HFrEF -01/2019 EF 40-45%.  Last Echo with EF worsened to 25-30%. # Coronary Artery Disease  Now with intermittent chest pain resolved with nitroglycerin. EKG with dynamic changes but does not meet STEMI criteria + Last catheterization 06/06/2020 + CABG (1994) - ASA 8144mO daily - Hold Lasix and Coreg for now - Trend troponins - Repeat 2D Echocardiogram - HTN, HLD, DM control as below  # HLD  + Goal LDL<100 - Atorvastatin 76m28m qhs  # HTN  + Goal BP <130/80 -Hold hypertensive meds due to sepsis as above   # DM Type ll -CBGs -Sliding scale insulin -Follow hyper/hypoglycemia protocol       Shawn FalcoN, MSN, DNP, CCRN,FNP-C  [facility] Nurse Practitioner  [facility]

## 2020-10-18 NOTE — Progress Notes (Signed)
Pharmacy Antibiotic Note  Shawn Jennings is a 73 y.o. male admitted on 10/17/2020 with suspected intra-abdominal infection and hx of CKD.  Pharmacy has been consulted for Vancomycin and Zosyn dosing.  Plan: Per dosing nomogram: Vancomycin 1500 mg once. Vancomycin 1000 IV every 24 hours.  Goal trough 15-20 mcg/mL.   Pt received first dose of Zosyn in ER on 12/27 @ 21:00.  Ordered Zosyn 3.375gm EI Q8H starting at 0500 this AM.  Pharmacy will continue to monitor for lab results and follow SCr and will adjust Abx dosing if warranted.  Height: 6' (182.9 cm) Weight: 72.6 kg (160 lb) IBW/kg (Calculated) : 77.6  Temp (24hrs), Avg:98.2 F (36.8 C), Min:98 F (36.7 C), Max:98.4 F (36.9 C)  Recent Labs  Lab 10/17/20 1846 10/18/20 0203  WBC 13.2* 19.8*  CREATININE 1.64* 1.71*  LATICACIDVEN  --  3.1*    Estimated Creatinine Clearance: 37.7 mL/min (A) (by C-G formula based on SCr of 1.71 mg/dL (H)).    No Known Allergies  Antimicrobials this admission: 12/27 Zosyn >>  12/28 Vancomycin >>   Microbiology results: 12/28 MRSA PCR: Positivie  Thank you for allowing pharmacy to be a part of this patient's care.  Otelia Sergeant, PharmD, [facility] [address] 10/18/2020 3:57 AM,

## 2020-10-18 NOTE — Progress Notes (Signed)
Rapid Response Event Note  Reason for Call : Hypotension   Initial Focused Assessment: Pt in bed, pale, complaining of abd pain. Has Choledocholithiasis. Became hypotensive as noted in VS flowsheet. NP Ouma to bedside, began fluid bolus, then decided pt needed to transfer to stepdown for levophed.     Interventions: Ordered BMP, Lactic, Lipase, CBC, PT INR, 12 lead ekg.    Plan of Care: Transfer to Room 20    Event Summary: Pt transferred  MD Notified: Anna Genre, NP Call Time:0250 Arrival [phone] End Time:0300  Arrie Eastern, RN

## 2020-10-18 NOTE — Consult Note (Addendum)
Shawn Jennings , MD [address] [place], [place], [place], [address] [address], [place], [place], [place], [address] Phone: [phone]  Fax: [phone]  Consultation  Referring Provider:    Dr. Manuella Ghazi  primary Care Physician:  Orvis Brill, [facility] Primary Gastroenterologist: None         Reason for Consultation:     Choledocholithiasis  Date of Admission:  10/17/2020 Date of Consultation:  10/18/2020         HPI:   Shawn Jennings is a 74 y.o. male presented to the emergency room on 10/17/2020 with chest pain.  He has a history of GERD, CAD, CABG, COPD.  In the ER his blood pressure was 147 x 72.  Found to have elevated transaminases and bilirubin of 1.8.  Elevated troponin.  Found to be in atrial fibrillation with a rate of 87/min.  He underwent a CT scan of the abdomen which demonstrated choledocholithiasis and a 6 x 5 mm stone in the distal common bile duct with mild intra and extrahepatic biliary ductal dilation.  Some features of gastritis was seen advanced aortic atherosclerosis was noted emphysema and lower lobe bronchial wall thickening was noted.At around 5 AM he was found to be hypotensive with tachypnea.  And transferred to the ICU.  Commenced on Levophed and given Zosyn along with vancomycin.  He is on Xarelto for atrial fibrillation.  I have been called to assess for ERCP.  He states that he developed acute severe epigastric pain radiating to his back all of a sudden yesterday, the pain still persists. Feels very thirsty , no other complaints. Since coming in he feels atleast 50 % better. No similar complaints in the past .Denies any excess alcohol consumption, no new medications commenced and not taken any herbal supplements.   Past Medical History:  Diagnosis Date  . Aortic stenosis    a. Pt unaware of history but CT imaging 01/2019 consistent w/ AVR; b. 01/2019 Echo: Mild to mod AS. Mean grad 45mmHg.  Marland Kitchen CAD (coronary artery disease)    a. 1994 s/p CABG  ([place]); b. Reports multiple stress tests over the years w/o repeat cath; c. 01/2019 MV: large, sev, fixed inf and inflat defect extending to apex. No ischemia. EF 37%-->Med rx given atypical Ss and pt wishes.  . CKD (chronic kidney disease), stage III ([place])   . COPD (chronic obstructive pulmonary disease) ([place])   . Depression   . Essential hypertension   . GERD (gastroesophageal reflux disease)   . Hyperlipidemia LDL goal <70   . Ischemic cardiomyopathy    a. 01/2019 Echo: EF 40-45%, impaired relaxation. Mildly dil LA. Sev mitral annular Ca2+. Mild to mod AS.  Marland Kitchen PAD (peripheral artery disease) ([place])    a. 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  Marland Kitchen PAF (paroxysmal atrial fibrillation) ([facility])    a. CHA2DS2VASc = 4-->Xarelto 15mg  daily in setting of CKD.  Marland Kitchen Thyrotoxicosis   . Tobacco abuse   . Type II diabetes mellitus ([place])     Past Surgical History:  Procedure Laterality Date  . CARDIAC CATHETERIZATION    . CHOLECYSTECTOMY    . CORONARY ARTERY BYPASS GRAFT     a. [place], [place]  . Left AKA    . RIGHT/LEFT HEART CATH AND CORONARY ANGIOGRAPHY N/A 06/06/2020   Procedure: RIGHT/LEFT HEART CATH AND CORONARY ANGIOGRAPHY;  Surgeon: Wellington Hampshire, MD;  Location: [facility];  Service: Cardiovascular;  Laterality: N/A;    Prior to Admission medications  Medication Sig Start Date End Date Taking? Authorizing Provider  acetaminophen (TYLENOL) 500 MG tablet Take 500 mg by mouth every 6 (six) hours as needed.    [provider]  albuterol (PROVENTIL) (2.5 MG/3ML) 0.083% nebulizer solution Take 2.5 mg by nebulization every 4 (four) hours as needed for wheezing or shortness of breath.    [provider]  albuterol (VENTOLIN HFA) 108 (90 Base) MCG/ACT inhaler Inhale 2 puffs into the lungs every 6 (six) hours as needed for wheezing or shortness of breath.     [provider]  aspirin EC 81 MG tablet Take 81 mg by mouth daily.    [provider]  atorvastatin (LIPITOR)  80 MG tablet Take 80 mg by mouth at bedtime.    [provider]  carvedilol (COREG) 3.125 MG tablet Take 1 tablet (3.125 mg total) by mouth 2 (two) times daily. 06/17/20 09/15/20  Loel Dubonnet, NP  DULoxetine (CYMBALTA) 30 MG capsule Take 30 mg by mouth 2 (two) times daily.    [provider]  ferrous sulfate 325 (65 FE) MG tablet Take 325 mg by mouth daily.    [provider]  Fluticasone-Salmeterol (ADVAIR) 250-50 MCG/DOSE AEPB Inhale 1 puff into the lungs 2 (two) times daily.    [provider]  furosemide (LASIX) 80 MG tablet Take 1 tablet (80 mg total) once daily in the morning 06/20/20   Loel Dubonnet, NP  hydrOXYzine (ATARAX/VISTARIL) 25 MG tablet Take 25 mg by mouth every 6 (six) hours as needed (allergy symptoms).    [provider]  isosorbide mononitrate (IMDUR) 30 MG 24 hr tablet Take 1 tablet (30 mg total) by mouth daily. 03/11/20 05/27/29  Sharen Hones, MD  ketotifen (ZADITOR) 0.025 % ophthalmic solution Place 1 drop into both eyes 2 (two) times daily as needed.     [provider]  lactulose (CHRONULAC) 10 GM/15ML solution Take 30 g by mouth every 12 (twelve) hours as needed for mild constipation.    [provider]  lamoTRIgine (LAMICTAL) 25 MG tablet Take 25 mg by mouth 2 (two) times daily.    [provider]  lidocaine (LIDODERM) 5 % Cut 1 patch in half and apply to amputated stump once daily 12 hours on, 12 hours off    [provider]  liver oil-zinc oxide (DESITIN) 40 % ointment Apply 1 application topically at bedtime as needed for irritation. To buttocks     [provider]  loperamide (IMODIUM) 2 MG capsule Take by mouth. 03/31/20   [provider]  LORazepam (ATIVAN) 0.5 MG tablet Take 0.5 mg by mouth 2 (two) times daily as needed for anxiety.    [provider]  Melatonin 3 MG TABS Take 1 tablet by mouth at bedtime.    [provider]  metFORMIN  (GLUCOPHAGE) 1000 MG tablet Take by mouth. 08/04/20   [provider]  metFORMIN (GLUCOPHAGE) 500 MG tablet Take 500 mg by mouth 2 (two) times daily with a meal.     [provider]  montelukast (SINGULAIR) 10 MG tablet Take 10 mg by mouth at bedtime.    [provider]  Multiple Vitamins-Minerals (PRESERVISION AREDS 2) CAPS Take 1 capsule by mouth 2 (two) times daily.    [provider]  nicotine (NICODERM CQ - DOSED IN MG/24 HOURS) 14 mg/24hr patch Place 14 mg onto the skin daily.    [provider]  nitroGLYCERIN (NITROSTAT) 0.4 MG SL tablet Place 0.4 mg  under the tongue every 5 (five) minutes as needed for chest pain.    [provider]  pantoprazole (PROTONIX) 40 MG tablet Take 40 mg by mouth daily.    [provider]  potassium chloride SA (KLOR-CON) 20 MEQ tablet Take 1 tablet (20 mEq total) by mouth daily. 06/20/20 09/18/20  Alver Sorrow, NP  Rivaroxaban (XARELTO) 15 MG TABS tablet Take 15 mg by mouth daily.    [provider]  sacubitril-valsartan (ENTRESTO) 24-26 MG Take 1 tablet by mouth 2 (two) times daily. 05/27/20   Alver Sorrow, NP  senna (SENOKOT) 8.6 MG tablet Take 2 tablets by mouth at bedtime.    [provider]  tamsulosin (FLOMAX) 0.4 MG CAPS capsule Take 0.4 mg by mouth daily.    [provider]  traZODone (DESYREL) 50 MG tablet Take 25 mg by mouth at bedtime as needed for sleep.    [provider]  triamcinolone cream (KENALOG) 0.1 % Apply 1 application topically 2 (two) times daily.    [provider]    Family History  Problem Relation Age of Onset  . Heart attack Mother        died @ 36.  . Other Father        never knew his father.  . Other Sister        overdose of sleeping pills.  . Heart disease Brother        died in his 46's  . Other Sister        complication of abd surgery @ 61  . CAD Sister      Social History   Tobacco Use  . Smoking  status: Current Every Day Smoker    Packs/day: 1.00    Years: 62.00    Pack years: 62.00  . Smokeless tobacco: Never Used  Vaping Use  . Vaping Use: Never used  Substance Use Topics  . Alcohol use: Never  . Drug use: Never    Allergies as of 10/17/2020  . (No Known Allergies)    Review of Systems:    All systems reviewed and negative except where noted in HPI.   Physical Exam:  Vital signs in last 24 hours: Temp:  [97.6 F (36.4 C)-98.4 F (36.9 C)] 97.6 F (36.4 C) (12/28 0356) Pulse Rate:  [53-121] 85 (12/28 0605) Resp:  [18-32] 26 (12/28 0605) BP: (67-147)/(37-72) 98/50 (12/28 0605) SpO2:  [79 %-100 %] 98 % (12/28 0605) Weight:  [72.6 kg] 72.6 kg (12/27 1828) Last BM Date:  (PTA) General:   Pleasant, cooperative in NAD Head:  Normocephalic and atraumatic. Eyes:   No icterus.   Conjunctiva pink. PERRLA. Ears:  Normal auditory acuity. Neck:  Supple; no masses or thyroidomegaly Lungs: decreased air entry b/l at the bases Heart:  Regular rate and rhythm;  Systolic soft murmur over aortic area, no clicks, rubs or gallops Abdomen:  Soft, nondistended, mild epigastric tenderness,  Normal bowel sounds. No appreciable masses or hepatomegaly.  No rebound or guarding.  Neurologic:  Alert and oriented x3;  grossly normal neurologically. Skin:  Intact without significant lesions or rashes. Cervical Nodes:  No significant cervical adenopathy. Psych:  Alert and cooperative. Normal affect.  LAB RESULTS: Recent Labs    10/17/20 1846 10/18/20 0203 10/18/20 0453  WBC 13.2* 19.8* 17.2*  HGB 11.9* 11.3* 11.7*  HCT 36.4* 34.8* 35.0*  PLT 227 212 214   BMET Recent Labs    10/17/20 1846 10/18/20 0203 10/18/20 0453  NA  139 142 143  K 3.6 3.1* 3.4*  CL 107 107 109  CO2 19* 22 20*  GLUCOSE 135* 178* 120*  BUN 29* 29* 27*  CREATININE 1.64* 1.71* 1.73*  CALCIUM 7.6* 7.2* 7.2*   LFT Recent Labs    10/17/20 1846  PROT 6.7  ALBUMIN 3.5  AST 839*  ALT 556*  ALKPHOS  481*  BILITOT 1.8*   PT/INR Recent Labs    10/18/20 0212  LABPROT 17.2*  INR 1.5*    STUDIES: DG Chest 2 View  Result Date: 10/17/2020 CLINICAL DATA:  Acute chest pain, status post CABG. 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 The heart size and mediastinal contours are unchanged. Status post aortic valve replacement. Aortic arch calcifications. Bibasilar compressive changes. No definite focal consolidation. No pulmonary edema. No pleural effusion. No pneumothorax. No acute osseous abnormality.  Old healed left clavicular fracture. IMPRESSION: No active cardiopulmonary disease. Electronically Signed   By: Iven Finn M.D.   On: 10/17/2020 18:45   CT ABDOMEN PELVIS W CONTRAST  Result Date: 10/17/2020 CLINICAL DATA:  Acute abdominal pain.  Pancreatitis.  Vomiting. EXAM: CT ABDOMEN AND PELVIS WITH CONTRAST TECHNIQUE: Multidetector CT imaging of the abdomen and pelvis was performed using the standard protocol following bolus administration of intravenous contrast. CONTRAST:  5mL OMNIPAQUE IOHEXOL 300 MG/ML  SOLN COMPARISON:  None. FINDINGS: Lower chest: Prosthetic aortic valve. Coronary artery calcifications. Basilar emphysema. Ill-defined reticulonodular opacities in the dependent lower lobes. Areas of lower lobe mucous plugging. No pleural fluid. Hepatobiliary: Motion artifact through the liver. There is no focal hepatic lesion. Post cholecystectomy. r there is intra and extrahepatic biliary ductal dilatation with common bile duct spanning 13 mm at the porta hepatis. There is a 6 x 5 mm stone in the distal common bile duct, series 2, image 39. Pancreas: No peripancreatic fat stranding. No pancreatic ductal dilatation. No pancreatic mass or pseudocyst. There is motion artifact through the head and uncinate process. Spleen: Calcified granuloma.  Normal in size. Adrenals/Urinary Tract: Mild left  adrenal thickening. No suspicious adrenal nodule. Symmetric bilateral perinephric edema. There is no hydronephrosis. Renal vascular calcifications versus possible nonobstructing stones in the left kidney. Subcentimeter low-density in the upper right kidney is likely cyst. Tiny low-density lesion in the mid left kidney is too small to characterize. Both ureters are decompressed without stones along the course. Partially distended urinary bladder. Stomach/Bowel: Bowel evaluation is limited in the absence of enteric contrast, patient motion, and paucity of intra-abdominal fat. No abnormal gastric distension. Equivocal wall thickening about the greater curvature. There is a periampullary duodenal diverticulum without acute inflammation. Presumed enteric sutures in the left upper quadrant with patulous loop of small bowel. No obstruction. No evidence of small bowel inflammation. Appendix normal. Mild colonic tortuosity. Small to moderate colonic stool burden. No colonic wall thickening or inflammatory change. Vascular/Lymphatic: Advanced aortic and branch atherosclerosis with mixed calcified and noncalcified atheromatous plaque. Suspected high-grade stenosis of the common iliac arteries, left greater than right. Left superficial femoral artery including stent appear occluded. Plaque throughout the visceral branches of the abdomen. No evidence of acute vascular abnormality. Portal vein is patent. No bulky abdominopelvic adenopathy. Reproductive: Prostatic calcifications. Other: No ascites, free air, or focal fluid collection. No abdominal wall hernia. Musculoskeletal: Osteopenia/osteoporosis. There are no acute or suspicious osseous abnormalities. IMPRESSION: 1. Choledocholithiasis with 6 x 5 mm stone in the distal common bile duct. Mild intra and  extrahepatic biliary ductal dilatation. 2. Periampullary duodenal diverticulum. 3. No CT findings of pancreatitis. 4. Equivocal wall thickening about the greater curvature of  the stomach, can be seen with gastritis. 5. Advanced aortic and branch atherosclerosis. Renal vascular calcifications versus possible nonobstructing stones in the left kidney. Occluded left superficial femoral artery stent is likely chronic. 6. 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 Findings favor bronchiolitis, recommend correlation for aspiration risk factors. Aortic Atherosclerosis (ICD10-I70.0) and Emphysema (ICD10-J43.9). Electronically Signed   By: Narda Rutherford M.D.   On: 10/17/2020 20:31   DG Chest Port 1 View  Result Date: 10/18/2020 CLINICAL DATA:  Central line placement. EXAM: PORTABLE CHEST 1 VIEW COMPARISON:  10/17/2020 FINDINGS: The right IJ central venous catheter tip is in the distal SVC near the cavoatrial junction. No complicating features. The heart is normal in size. Stable tortuosity, ectasia and calcification of the thoracic aorta. Mild central vascular congestion without overt pulmonary edema. Streaky areas of atelectasis but no definite infiltrates or effusions. No pneumothorax. IMPRESSION: 1. Right IJ central venous catheter in good position without complicating features. 2. Streaky areas of atelectasis and mild central vascular congestion. Electronically Signed   By: Rudie Meyer M.D.   On: 10/18/2020 09:17      Impression / Plan:   Cris Talavera is a 74 y.o. y/o male with history of CAD, COPD, GERD, diabetes admitted with chest pain, AKI, found to have choledocholithiasis on imaging, total bilirubin of 1.8 and elevated transaminases, in the night developed hypotension and had to be commenced on Levophed.His history and presentation is very suggestive of acute severe cholangitis.Elevated Lipase and epigastric pain radiating to the back, AKI , low calcium suggestive of severe gall stone pancreatitis.   The patient has been discussed with interventional radiology and they do not see a  dilated common bile duct that is a viable to perform percutaneous drainage.  Hence ERCP is the only possible option at this point of time.  Patient has had a prior cholecystectomy.  Common bile duct is 13 mm at the porta hepatis.  Plan 1.  Continue Zosyn renally dosed 2.  IV fluids - suggest ringer lactate and Levophed for inotropic support.Suggest increasing fluids- he appears on examination very dry and part of the hypotension is likely hypovolemia from pancreattiis.  3.  Obtain cardiac evaluation for elevated troponin.  4. Would suggest ERCP after he is stabilized, after he has had a few litre's of fluid , antibiotics. Aim for tomorrow. With hypovolemia he would likely get hypotensive when he is intubated and sedated.  5. He feels much better after coming in - stated " 50% better"  6. Hold Xarelto is possible.   I have discussed alternative options, risks & benefits,  which include, but are not limited to, bleeding, infection, perforation,respiratory complication, pancratitis , drug reaction and death .  The patient agrees with this plan & written consent will be obtained.     Thank you for involving me in the care of this patient.      LOS: 1 day   Wyline Mood, MD  10/18/2020, 9:43 AM

## 2020-10-18 NOTE — Progress Notes (Signed)
Initial Nutrition Assessment  DOCUMENTATION CODES:   Severe malnutrition in context of chronic illness  INTERVENTION:   RD will monitor for diet advancement and add supplements when appropriate   Pt at high refeed risk; recommend monitor potassium, magnesium and phosphorus labs daily until stable  NUTRITION DIAGNOSIS:   Severe Malnutrition related to chronic illness (COPD) as evidenced by severe fat depletion,severe muscle depletion.  GOAL:   Patient will meet greater than or equal to 90% of their needs  MONITOR:   Diet advancement,Labs,Weight trends,Skin,I & O's  REASON FOR ASSESSMENT:   Malnutrition Screening Tool    ASSESSMENT:   74 y.o. male with a known history of coronary artery disease status post CABG, COPD, depression, hypertension, GERD, dyslipidemia and type 2 diabetes mellitus who presented to the emergency room with occurrences of substernal chest pain and found to have choledocholithiasis   Met with pt in room today. Pt reports that he is feeling better but is still having some abdominal pain. Pt with decreased appetite and oral intake pta r/t abdominal pain and nausea. Pt is currently NPO pending GI consult. Pt is on pressors. Per MD note, plan is for ERCP once pt is more stable. RD will monitor for diet advancement and add supplements when appropriate. Per chart, pt appears fairly weight stable at baseline but is currently up ~ 20lbs from his UBW.   Medications reviewed and include: ferrous sulfate, insulin, melatonin, ocuvite, nicotine, protonix, senokot, levophed, zosyn, KCl, vancomycin, vasopressin   Labs reviewed: K 3.1(L), BUN 28(H), creat 1.77(H) Wbc- 17.2(H) cbgs- 170, 131, 153 x 24 hrs AIC 6.3(H)- 12/28  NUTRITION - FOCUSED PHYSICAL EXAM:  Flowsheet Row Most Recent Value  Orbital Region Severe depletion  Upper Arm Region Severe depletion  Thoracic and Lumbar Region Severe depletion  Buccal Region Severe depletion  Temple Region Severe depletion   Clavicle Bone Region Severe depletion  Clavicle and Acromion Bone Region Severe depletion  Scapular Bone Region Severe depletion  Dorsal Hand Severe depletion  Patellar Region Severe depletion  Anterior Thigh Region Severe depletion  Posterior Calf Region Severe depletion  Edema (RD Assessment) None  Hair Reviewed  Eyes Reviewed  Mouth Reviewed  Skin Reviewed  Nails Reviewed     Diet Order:   Diet Order            Diet NPO time specified  Diet effective now                EDUCATION NEEDS:   Education needs have been addressed  Skin:  Skin Assessment: Reviewed RN Assessment (ecchymosis, jaundice)  Last BM:  PTA  Height:   Ht Readings from Last 1 Encounters:  10/17/20 6' (1.829 m)    Weight:   Wt Readings from Last 1 Encounters:  10/17/20 72.6 kg    Ideal Body Weight:  80.9 kg  BMI:  Body mass index is 21.7 kg/m.  Estimated Nutritional Needs:   Kcal:  1900-2200kcal/day  Protein:  95-110g/day  Fluid:  1.7-2.0L/day  Koleen Distance MS, RD, LDN Please refer to [facility] for RD and/or RD on-call/weekend/after hours pager

## 2020-10-18 NOTE — Procedures (Signed)
Central Venous Catheter Insertion Procedure Note  Shawn Jennings  [id number]  08/09/1946  Date:10/18/20  Time:9:45 AM   Provider Performing:[facility]   Procedure: Insertion of Non-tunneled Central Venous [phone]) with US guidance ([id number])   Indication(s) Medication administration  Consent Risks of the procedure as well as the alternatives and risks of each were explained to the patient and/or caregiver.  Consent for the procedure was obtained and is signed in the bedside chart  Anesthesia Topical only with 1% lidocaine   Timeout Verified patient identification, verified procedure, site/side was marked, verified correct patient position, special equipment/implants available, medications/allergies/relevant history reviewed, required imaging and test results available.  Sterile Technique Maximal sterile technique including full sterile barrier drape, hand hygiene, sterile gown, sterile gloves, mask, hair covering, sterile ultrasound probe cover (if used).  Procedure Description Area of catheter insertion was cleaned with chlorhexidine and draped in sterile fashion.  With real-time ultrasound guidance a central venous catheter was placed into the right internal jugular vein. Nonpulsatile blood flow and easy flushing noted in all ports.  The catheter was sutured in place and sterile dressing applied.  Complications/Tolerance None; patient tolerated the procedure well. Chest X-ray is ordered to verify placement for internal jugular or subclavian cannulation.   Chest x-ray is not ordered for femoral cannulation.  EBL Minimal  Specimen(s) None  Sonda Rumble, AGNP  Pulmonary/Critical Care Pager [phone] (please enter 7 digits) PCCM Consult Pager [phone] (please enter 7 digits)

## 2020-10-19 ENCOUNTER — Encounter: Payer: Self-pay | Admitting: Family Medicine

## 2020-10-19 ENCOUNTER — Inpatient Hospital Stay: Payer: Medicare Other | Admitting: Anesthesiology

## 2020-10-19 ENCOUNTER — Encounter
Admission: EM | Disposition: A | Discharge status: Skilled Nursing Facility | Payer: Self-pay | Source: Home / Self Care | Attending: Internal Medicine

## 2020-10-19 ENCOUNTER — Other Ambulatory Visit: Payer: Self-pay

## 2020-10-19 ENCOUNTER — Inpatient Hospital Stay: Payer: Medicare Other

## 2020-10-19 ENCOUNTER — Inpatient Hospital Stay
Admit: 2020-10-19 | Discharge: 2020-10-19 | Disposition: A | Discharge status: Home / Self Care | Payer: Medicare Other | Attending: Pulmonary Disease | Admitting: Pulmonary Disease

## 2020-10-19 DIAGNOSIS — K8309 Other cholangitis: Secondary | ICD-10-CM

## 2020-10-19 DIAGNOSIS — K8050 Calculus of bile duct without cholangitis or cholecystitis without obstruction: Secondary | ICD-10-CM | POA: Diagnosis not present

## 2020-10-19 DIAGNOSIS — A419 Sepsis, unspecified organism: Secondary | ICD-10-CM | POA: Diagnosis not present

## 2020-10-19 HISTORY — PX: ENDOSCOPIC RETROGRADE CHOLANGIOPANCREATOGRAPHY (ERCP) WITH PROPOFOL: SHX5810

## 2020-10-19 LAB — PROTIME-INR
INR: 1.2 (ref 0.8–1.2)
Prothrombin Time: 14.3 s (ref 11.4–15.2)

## 2020-10-19 LAB — CBC WITH DIFFERENTIAL/PLATELET
Abs Immature Granulocytes: 0.06 10*3/uL (ref 0.00–0.07)
Basophils Absolute: 0.1 10*3/uL (ref 0.0–0.1)
Basophils Relative: 0 %
Eosinophils Absolute: 0.3 10*3/uL (ref 0.0–0.5)
Eosinophils Relative: 2 %
HCT: 30.4 % — ABNORMAL LOW (ref 39.0–52.0)
Hemoglobin: 10.1 g/dL — ABNORMAL LOW (ref 13.0–17.0)
Immature Granulocytes: 1 %
Lymphocytes Relative: 7 %
Lymphs Abs: 0.9 10*3/uL (ref 0.7–4.0)
MCH: 28.5 pg (ref 26.0–34.0)
MCHC: 33.2 g/dL (ref 30.0–36.0)
MCV: 85.6 fL (ref 80.0–100.0)
Monocytes Absolute: 0.6 10*3/uL (ref 0.1–1.0)
Monocytes Relative: 5 %
Neutro Abs: 10.4 10*3/uL — ABNORMAL HIGH (ref 1.7–7.7)
Neutrophils Relative %: 85 %
Platelets: 184 10*3/uL (ref 150–400)
RBC: 3.55 MIL/uL — ABNORMAL LOW (ref 4.22–5.81)
RDW: 14.6 % (ref 11.5–15.5)
WBC: 12.3 10*3/uL — ABNORMAL HIGH (ref 4.0–10.5)
nRBC: 0 % (ref 0.0–0.2)

## 2020-10-19 LAB — ECHOCARDIOGRAM COMPLETE
AR max vel: 1.05 cm2
AV Area VTI: 1.11 cm2
AV Area mean vel: 0.97 cm2
AV Mean grad: 18.7 mmHg
AV Peak grad: 32 mmHg
Ao pk vel: 2.83 m/s
Area-P 1/2: 2.78 cm2
Calc EF: 47.9 %
Height: 72 in
S' Lateral: 3.62 cm
Single Plane A2C EF: 43.9 %
Single Plane A4C EF: 52.8 %
Weight: 2560 oz

## 2020-10-19 LAB — URINE CULTURE: Culture: NO GROWTH

## 2020-10-19 LAB — GLUCOSE, CAPILLARY
Glucose-Capillary: 117 mg/dL — ABNORMAL HIGH (ref 70–99)
Glucose-Capillary: 136 mg/dL — ABNORMAL HIGH (ref 70–99)
Glucose-Capillary: 149 mg/dL — ABNORMAL HIGH (ref 70–99)
Glucose-Capillary: 154 mg/dL — ABNORMAL HIGH (ref 70–99)
Glucose-Capillary: 155 mg/dL — ABNORMAL HIGH (ref 70–99)
Glucose-Capillary: 164 mg/dL — ABNORMAL HIGH (ref 70–99)
Glucose-Capillary: 199 mg/dL — ABNORMAL HIGH (ref 70–99)
Glucose-Capillary: 257 mg/dL — ABNORMAL HIGH (ref 70–99)

## 2020-10-19 LAB — COMPREHENSIVE METABOLIC PANEL
ALT: 275 U/L — ABNORMAL HIGH (ref 0–44)
AST: 135 U/L — ABNORMAL HIGH (ref 15–41)
Albumin: 2.5 g/dL — ABNORMAL LOW (ref 3.5–5.0)
Alkaline Phosphatase: 288 U/L — ABNORMAL HIGH (ref 38–126)
Anion gap: 11 (ref 5–15)
BUN: 27 mg/dL — ABNORMAL HIGH (ref 8–23)
CO2: 20 mmol/L — ABNORMAL LOW (ref 22–32)
Calcium: 7.1 mg/dL — ABNORMAL LOW (ref 8.9–10.3)
Chloride: 108 mmol/L (ref 98–111)
Creatinine, Ser: 1.7 mg/dL — ABNORMAL HIGH (ref 0.61–1.24)
GFR, Estimated: 41 mL/min — ABNORMAL LOW (ref >60)
Glucose, Bld: 138 mg/dL — ABNORMAL HIGH (ref 70–99)
Potassium: 3.5 mmol/L (ref 3.5–5.1)
Sodium: 139 mmol/L (ref 135–145)
Total Bilirubin: 3.6 mg/dL — ABNORMAL HIGH (ref 0.3–1.2)
Total Protein: 5.3 g/dL — ABNORMAL LOW (ref 6.5–8.1)

## 2020-10-19 LAB — MAGNESIUM: Magnesium: 2.5 mg/dL — ABNORMAL HIGH (ref 1.7–2.4)

## 2020-10-19 LAB — HEPARIN LEVEL (UNFRACTIONATED): Heparin Unfractionated: <0.1 [IU]/mL — ABNORMAL LOW (ref 0.30–0.70)

## 2020-10-19 LAB — PROCALCITONIN: Procalcitonin: 7.71 ng/mL

## 2020-10-19 LAB — APTT: aPTT: 41 seconds — ABNORMAL HIGH (ref 24–36)

## 2020-10-19 SURGERY — ENDOSCOPIC RETROGRADE CHOLANGIOPANCREATOGRAPHY (ERCP) WITH PROPOFOL
Anesthesia: General

## 2020-10-19 MED ORDER — ONDANSETRON HCL 4 MG/2ML IJ SOLN
INTRAMUSCULAR | Status: DC | PRN
  Administered 2020-10-19: 4 mg via INTRAVENOUS

## 2020-10-19 MED ORDER — INDOMETHACIN 50 MG RE SUPP
RECTAL | Status: AC
  Administered 2020-10-19: 12:00:00 100 mg via RECTAL
  Filled 2020-10-19: qty 2

## 2020-10-19 MED ORDER — ESMOLOL HCL 100 MG/10ML IV SOLN
INTRAVENOUS | Status: DC | PRN
  Administered 2020-10-19: 20 mg via INTRAVENOUS

## 2020-10-19 MED ORDER — EPHEDRINE SULFATE 50 MG/ML IJ SOLN
INTRAMUSCULAR | Status: DC | PRN
  Administered 2020-10-19: 5 mg via INTRAVENOUS

## 2020-10-19 MED ORDER — INDOMETHACIN 50 MG RE SUPP: 100.0000 mg | Freq: Once | RECTAL | Status: AC

## 2020-10-19 MED ORDER — SUGAMMADEX SODIUM 200 MG/2ML IV SOLN
INTRAVENOUS | Status: DC | PRN
  Administered 2020-10-19: 200 mg via INTRAVENOUS

## 2020-10-19 MED ORDER — ONDANSETRON HCL 4 MG/2ML IJ SOLN
INTRAMUSCULAR | Status: AC
  Filled 2020-10-19: qty 2

## 2020-10-19 MED ORDER — PROPOFOL 10 MG/ML IV BOLUS
INTRAVENOUS | Status: DC | PRN
  Administered 2020-10-19: 40 mg via INTRAVENOUS
  Administered 2020-10-19: 20 mg via INTRAVENOUS
  Administered 2020-10-19: 40 mg via INTRAVENOUS

## 2020-10-19 MED ORDER — PROPOFOL 10 MG/ML IV BOLUS: INTRAVENOUS | Status: DC | PRN

## 2020-10-19 MED ORDER — LIDOCAINE HCL (PF) 2 % IJ SOLN
INTRAMUSCULAR | Status: AC
  Filled 2020-10-19: qty 5

## 2020-10-19 MED ORDER — DEXAMETHASONE SODIUM PHOSPHATE 10 MG/ML IJ SOLN
INTRAMUSCULAR | Status: DC | PRN
  Administered 2020-10-19: 5 mg via INTRAVENOUS

## 2020-10-19 MED ORDER — PROPOFOL 10 MG/ML IV BOLUS
INTRAVENOUS | Status: AC
  Filled 2020-10-19: qty 20

## 2020-10-19 MED ORDER — DEXAMETHASONE SODIUM PHOSPHATE 10 MG/ML IJ SOLN
INTRAMUSCULAR | Status: AC
  Filled 2020-10-19: qty 1

## 2020-10-19 MED ORDER — ROCURONIUM BROMIDE 100 MG/10ML IV SOLN
INTRAVENOUS | Status: DC | PRN
  Administered 2020-10-19: 20 mg via INTRAVENOUS

## 2020-10-19 MED ORDER — ESMOLOL HCL 100 MG/10ML IV SOLN
INTRAVENOUS | Status: AC
  Filled 2020-10-19: qty 10

## 2020-10-19 MED ORDER — SUCCINYLCHOLINE CHLORIDE 20 MG/ML IJ SOLN
INTRAMUSCULAR | Status: DC | PRN
  Administered 2020-10-19: 80 mg via INTRAVENOUS

## 2020-10-19 MED ORDER — NOREPINEPHRINE 4 MG/250ML-% IV SOLN
INTRAVENOUS | Status: AC
  Filled 2020-10-19: qty 250

## 2020-10-19 MED ORDER — FENTANYL CITRATE (PF) 100 MCG/2ML IJ SOLN
INTRAMUSCULAR | Status: AC
  Filled 2020-10-19: qty 2

## 2020-10-19 MED ORDER — FENTANYL CITRATE (PF) 100 MCG/2ML IJ SOLN
INTRAMUSCULAR | Status: DC | PRN
  Administered 2020-10-19 (×2): 50 ug via INTRAVENOUS

## 2020-10-19 MED ORDER — LIDOCAINE HCL (CARDIAC) PF 100 MG/5ML IV SOSY
PREFILLED_SYRINGE | INTRAVENOUS | Status: DC | PRN
  Administered 2020-10-19: 60 mg via INTRAVENOUS

## 2020-10-19 MED ORDER — VASOPRESSIN 20 UNITS/100 ML INFUSION FOR SHOCK
0.0000 [IU]/min | INTRAVENOUS | Status: DC
  Administered 2020-10-20 (×2): 0.03 [IU]/min via INTRAVENOUS
  Filled 2020-10-19 (×2): qty 100

## 2020-10-19 MED ORDER — DOCUSATE SODIUM 100 MG PO CAPS
100.0000 mg | ORAL_CAPSULE | Freq: Two times a day (BID) | ORAL | Status: DC
  Administered 2020-10-19 – 2020-10-25 (×11): 100 mg via ORAL
  Filled 2020-10-19 (×11): qty 1

## 2020-10-19 MED ORDER — SUCCINYLCHOLINE CHLORIDE 200 MG/10ML IV SOSY
PREFILLED_SYRINGE | INTRAVENOUS | Status: AC
  Filled 2020-10-19: qty 10

## 2020-10-19 MED ORDER — ROCURONIUM BROMIDE 10 MG/ML (PF) SYRINGE
PREFILLED_SYRINGE | INTRAVENOUS | Status: AC
  Filled 2020-10-19: qty 10

## 2020-10-19 MED ORDER — LACTATED RINGERS IV SOLN: INTRAVENOUS | Status: DC

## 2020-10-19 NOTE — Op Note (Signed)
[facility] Patient Name: Shawn Jennings Procedure Date: 10/19/2020 11:36 AM MRN: [id number] Account #: [id number] Date of Birth: 05/08/1946 Admit Type: Inpatient Age: 74 Room: [facility] ENDO ROOM 4 Gender: Male Note Status: Finalized Procedure:             ERCP Indications:           Bile duct stone(s), Ascending cholangitis Providers:             [name] MD, MD Medicines:             General Anesthesia Complications:         No immediate complications. Procedure:             Pre-Anesthesia Assessment:                        - Prior to the procedure, a History and Physical was                         performed, and patient medications and allergies were                         reviewed. The patient's tolerance of previous                         anesthesia was also reviewed. The risks and benefits                         of the procedure and the sedation options and risks                         were discussed with the patient. All questions were                         answered, and informed consent was obtained. Prior                         Anticoagulants: The patient has taken Xarelto                         (rivaroxaban), last dose was 2 days prior to                         procedure. ASA Grade Assessment: III - A patient with                         severe systemic disease. After reviewing the risks and                         benefits, the patient was deemed in satisfactory                         condition to undergo the procedure.                        After obtaining informed consent, the scope was passed                         under direct vision. Throughout the procedure, the  patient's blood pressure, pulse, and oxygen                         saturations were monitored continuously. The                         Duodenoscope was introduced through the mouth, and                         used to inject contrast  into and used to inject                         contrast into the bile duct. The ERCP was accomplished                         without difficulty. The patient tolerated the                         procedure well. Findings:      A scout film of the abdomen was obtained. Surgical clips, consistent       with a previous cholecystectomy, were seen in the area of the right       upper quadrant of the abdomen. The major papilla was located entirely       within a diverticulum. The major papilla was normal. The bile duct was       deeply cannulated with the short-nosed traction sphincterotome. Contrast       was injected. I personally interpreted the bile duct images. There was       brisk flow of contrast through the ducts. Image quality was excellent.       Contrast extended to the entire biliary tree. The lower third of the       main bile duct contained filling defect(s). A wire was passed into the       biliary tree. A 7 mm biliary sphincterotomy was made with a traction       (standard) sphincterotome using ERBE electrocautery. There was no       post-sphincterotomy bleeding. The biliary tree was swept with a 15 mm       balloon starting at the bifurcation. Sludge was swept from the duct. One       10 Fr by 7 cm plastic stent was placed 5 cm into the common bile duct.       Bile flowed through the stent. The stent was in good position. Impression:            - The major papilla was located entirely within a                         diverticulum.                        - The major papilla appeared normal.                        - A filling defect was seen on the cholangiogram.                        - A biliary sphincterotomy was performed.                        -  The biliary tree was swept and sludge was found.                        - One plastic stent was placed into the common bile                         duct. Recommendation:        - Return patient to ICU for ongoing care.                         - Clear liquid diet.                        - Continue present medications.                        - Repeat ERCP in 2 months to remove stent. Procedure Code(s):     --- Professional ---                        [phone], Endoscopic retrograde cholangiopancreatography                         (ERCP); with placement of endoscopic stent into                         biliary or pancreatic duct, including pre- and                         post-dilation and guide wire passage, when performed,                         including sphincterotomy, when performed, each stent                        [id number], Endoscopic retrograde cholangiopancreatography                         (ERCP); with removal of calculi/debris from                         biliary/pancreatic duct(s)                        [id number], Endoscopic catheterization of the biliary                         ductal system, radiological supervision and                         interpretation Diagnosis Code(s):     --- Professional ---                        K83.09, Other cholangitis                        K80.30, Calculus of bile duct with cholangitis,                         unspecified, without obstruction  R93.2, Abnormal findings on diagnostic imaging of                         liver and biliary tract CPT copyright 2019 American [employer] Association. All rights reserved. The codes documented in this report are preliminary and upon coder review may  be revised to meet current compliance requirements. Midge Minium MD, MD 10/19/2020 12:53:11 PM This report has been signed electronically. Number of Addenda: 0 Note Initiated On: 10/19/2020 11:36 AM Estimated Blood Loss:  Estimated blood loss: none.      [facility]

## 2020-10-19 NOTE — Progress Notes (Signed)
*  PRELIMINARY RESULTS* Echocardiogram 2D Echocardiogram has been performed.  Cristela Blue 10/19/2020, 9:42 AM

## 2020-10-19 NOTE — Consult Note (Signed)
NAME:  Cavon Smithart, MRN:  [id number], DOB:  11-13-1945, LOS: 2 ADMISSION DATE:  10/17/2020, CONSULTATION DATE: 10/18/2020 REFERRING MD: Webb Silversmith, NP, CHIEF COMPLAINT: Chest Pain   Brief History:  74 yo male admitted with septic shock secondary choledocholithiasis with stone in the distal common bile duct requiring levophed gtt   History of Present Illness:  This is a 74 yo male who presented to [facility] ER on 12/28 with c/o chest pain which resolved after he took nitroglycerin, onset of chest pain 2 hrs prior to ER presentation.  Upon arrival to the ER pt reported 8/10 intensity severe nonradiating epigastric pain along with nausea/vomiting onset 2 weeks prior to presentation.  He had a LHC on 05/2020 with 100% stenosed circumflex, LAD, and RCA, with intact grafts.  ER lab results revealed K+ 3.4, CO2 20, glucose 120, BUN 27, creatinine 1.73, pct 20.65, wbc 17.2, hgb 11.7, and abg pH 7.38/pCO2 31/pO2 93/bicarb 18.3. MRSA PCR positive. CXR and COVID-19/Influenza PCR negative. CT Abd Pelvis revealed choledocholithiasis with 6 x 5 mm stone in the distal common bile duct.  Pt hypotensive sbp 70's and tachycardic hr 117 meeting sepsis criteria.  He received 3.2L LR bolus along with zosyn in the ER with initial improvement of bp. He was admitted to the [facility] unit per hospitalist team with gastroenterology and general surgery consults pending.  However, on 12/28 pt developed hypotension requiring transfer to the stepdown unit and levophed gtt initiated.  PCCM team consulted to assist with management.    Interval updates:  10-19-20 no significant events overnight.  Patient able to get ERCP today.  States that afterwards he feels much better.  Patient is having some A. fib with RVR but rate sustained less than 150.  As patient is on Levophed drip will hold off on any beta-blockers or calcium channel blockers.  If sustains above 150 will initiate amiodarone.  Past Medical History:  Type II Diabetes  Mellitus Tobacco Abuse Thyrotoxicosis Paroxysmal Atrial Fibrillation (on xarelto) PAD Ischemic Cardiomyopathy (Echo 04/2020 EF 25-30% bioprosthetic aortic valve worked appropriately) HLD GERD  Essential HTN Depression COPD Stage III CKD CAD s/p CABG 1994 Aortic Stenosis   Significant Hospital Events:  12/27: Pt admitted to the medsurg unit with sepsis secondary to choledocholithiasis with stone in the distal common bile duct. 12/28: Pt with worsening hypotension requiring transfer to the stepdown unit for pressors   Consults:  Intensivist  Gastroenterology  General Surgery   Procedures:  Right IJ CVL 12/28>>  Significant Diagnostic Tests:  12/27: CT Abd Pelvis revealed choledocholithiasis with 6 x 5 mm stone in the distal common bile duct. Mild intra and extrahepatic biliary ductal dilatation. Periampullary duodenal diverticulum. No CT findings of pancreatitis. Equivocal wall thickening about the greater curvature of the stomach, can be seen with gastritis. Advanced aortic and branch atherosclerosis. Renal vascular calcifications versus possible nonobstructing stones in the left kidney. Occluded left superficial femoral artery stent is likely chronic. 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 Findings favor bronchiolitis, recommend correlation for aspiration risk factors  Micro Data:  COVID-19/Influenza PCR 12/27>>negative  MRSA PCR 12/28>>positive  Blood x2 12/28>> Urine 12/28>>  Antimicrobials:  Zosyn 12/27>> Vancomycin 12/28>>  Interim History / Subjective:  Pt alert/oriented c/o severe abdominal and back pain.  Objective   Blood pressure (!) 76/61, pulse (!) 141, temperature 98.7 F (37.1 C), resp. rate 18, height 6' (1.829 m), weight 72.6 kg, SpO2 (!) 88 %.  Intake/Output Summary (Last 24 hours) at 10/19/2020 1457 Last data filed at 10/19/2020 1443 Gross per 24 hour   Intake 1929.29 ml  Output 1150 ml  Net 779.29 ml   Filed Weights   10/17/20 1828 10/19/20 1110  Weight: 72.6 kg 72.6 kg    Examination: General: acutely ill appearing male resting in bed  HENT: supple, mild JVD  Lungs: faint crackles throughout, even, non labored  Cardiovascular: irregular irregular, no R/G, 2+ radial, 1+ distal pulses, no edema  Abdomen: hypoactive BS x4, soft, tender, distended  Extremities: left BKA, normal tone, moves all extremities  Neuro: alert and oriented, follows commands, PERRLA  GU: condom cath intact draining yellow urine   Assessment & Plan:   Acute hypoxic respiratory failure secondary to mild central vascular congestion  Hx: COPD and tobacco abuse  Prn supplemental O2 for dyspnea and/or hypoxia  Scheduled and prn bronchodilator therapy  Smoking cessation counseling provided   Septic shock secondary to choledocholithiasis with 6 x 5 mm stone in the distal common bile duct  Mildly elevated troponin likely secondary to demand ischemia in the setting of sepsis Paroxysmal atrial fibrillation   Hx: Ischemic Cardiomyopathy (Echo 04/2020 EF 25-30% bioprosthetic aortic valve worked appropriately), Aortic Stenosis, PAD, and CAD s/p CABG 1994, Aortic Stenosis  Continuous telemetry monitoring  Trend troponin  Levophed and vasopressin gtt to maintain map >65 Hold outpatient antihypertensives  Gentile fluid resuscitation  Echo pending  Okay to reinitiate anticoagulation in 24 to 48 hours after the procedure.  Acute on chronic renal failure with compensated metabolic acidosis (stage III CKD) Hypokalemia  Lactic acidosis-improving  Trend BMP and lactic acid  Replace electrolytes as indicated  Monitor UOP Avoid nephrotoxic medications    Sepsis secondary to choledocholithiasis with 6 x 5 mm stone in the distal common bile duct  Trend WBC and monitor fever curve  Continue antimicrobial therapy as outline above  Trend PCT  Follow cultures  Pt to  undergo ERCP vs. Biliary drain placement GI and general surgery consulted appreciate input  Patient allowed to eat.  Anemia without obvious acute blood loss Trend CBC  Monitor for s/sx of bleeding and transfuse for hgb <7  Type II diabetes mellitus  CBG's q4hrs  SSI   Acute pain  Prn morphine for pain management  Depression  Will restart antihypertensives once pt able to tolerate po's   Best practice (evaluated daily)  Diet: NPO for now  Pain/Anxiety/Delirium protocol (if indicated): prn morphine for pain management  VAP protocol (if indicated): not indicated  DVT prophylaxis: Will start heparin gtt post procedures  GI prophylaxis: iv protonix  Glucose control: SSI  Mobility: Bedrest for now Disposition: ICU   Goals of Care:   Code Status: Full Code   Labs   CBC: Recent Labs  Lab 10/17/20 1846 10/18/20 0203 10/18/20 0453 10/19/20 0633  WBC 13.2* 19.8* 17.2* 12.3*  NEUTROABS 11.6*  --   --  10.4*  HGB 11.9* 11.3* 11.7* 10.1*  HCT 36.4* 34.8* 35.0* 30.4*  MCV 87.3 85.9 86.2 85.6  PLT 227 212 214 [id number]    Basic Metabolic Panel: Recent Labs  Lab 10/17/20 1846 10/18/20 0203 10/18/20 0453 10/18/20 0958 10/18/20 2017 10/19/20 0633  NA 139 142 143 142  --  139  K 3.6 3.1* 3.4* 3.1*  --  3.5  CL 107 107 109 109  --  108  CO2 19* 22 20* 20*  --  20*  GLUCOSE 135* 178* 120* 176*  --  138*  BUN 29* 29* 27* 28*  --  27*  CREATININE 1.64* 1.71* 1.73* 1.77*  --  1.70*  CALCIUM 7.6* 7.2* 7.2* 6.8*  --  7.1*  MG  --  0.8*  --   --  3.0* 2.5*  PHOS  --  3.0  --   --   --   --    GFR: Estimated Creatinine Clearance: 38 mL/min (A) (by C-G formula based on SCr of 1.7 mg/dL (H)). Recent Labs  Lab 10/17/20 1846 10/18/20 0203 10/18/20 0453 10/18/20 0744 10/19/20 0633  PROCALCITON  --   --  20.65  --  7.71  WBC 13.2* 19.8* 17.2*  --  12.3*  LATICACIDVEN  --  3.1*  --  2.3*  --     Liver Function Tests: Recent Labs  Lab 10/17/20 1846 10/19/20 0633  AST 839*  135*  ALT 556* 275*  ALKPHOS 481* 288*  BILITOT 1.8* 3.6*  PROT 6.7 5.3*  ALBUMIN 3.5 2.5*   Recent Labs  Lab 10/17/20 1846 10/18/20 0212  LIPASE 79* 1,097*   No results for input(s): AMMONIA in the last 168 hours.  ABG    Component Value Date/Time   PHART 7.38 10/18/2020 0600   PCO2ART 31 (L) 10/18/2020 0600   PO2ART 93 10/18/2020 0600   HCO3 18.3 (L) 10/18/2020 0600   ACIDBASEDEF 5.8 (H) 10/18/2020 0600   O2SAT 97.1 10/18/2020 0600     Coagulation Profile: Recent Labs  Lab 10/18/20 0212  INR 1.5*    Cardiac Enzymes: No results for input(s): CKTOTAL, CKMB, CKMBINDEX, TROPONINI in the last 168 hours.  HbA1C: Hgb A1c MFr Bld  Date/Time Value Ref Range Status  10/18/2020 09:58 AM 6.3 (H) 4.8 - 5.6 % Final    Comment:    (NOTE) Pre diabetes:          5.7%-6.4%  Diabetes:              >6.4%  Glycemic control for   <7.0% adults with diabetes     CBG: Recent Labs  Lab 10/18/20 2018 10/18/20 2359 10/19/20 0357 10/19/20 0752 10/19/20 1449  GLUCAP 144* 164* 117* 149* 155*    Review of Systems: Positives in BOLD   Gen: Denies fever, chills, weight change, fatigue, night sweats HEENT: Denies blurred vision, double vision, hearing loss, tinnitus, sinus congestion, rhinorrhea, sore throat, neck stiffness, dysphagia PULM: Denies shortness of breath, cough, sputum production, hemoptysis, wheezing CV: chest pain, edema, orthopnea, paroxysmal nocturnal dyspnea, palpitations GI: abdominal pain, nausea, vomiting, diarrhea, hematochezia, melena, constipation, change in bowel habits GU: Denies dysuria, hematuria, polyuria, oliguria, urethral discharge Endocrine: Denies hot or cold intolerance, polyuria, polyphagia or appetite change Derm: Denies rash, dry skin, scaling or peeling skin change Heme: Denies easy bruising, bleeding, bleeding gums Neuro: Denies headache, numbness, weakness, slurred speech, loss of memory or consciousness  Past Medical History:  He,   has a past medical history of Aortic stenosis, CAD (coronary artery disease), CKD (chronic kidney disease), stage III (HCC), COPD (chronic obstructive pulmonary disease) (HCC), Depression, Essential hypertension, GERD (gastroesophageal reflux disease), Hyperlipidemia LDL goal <70, Ischemic cardiomyopathy, PAD (peripheral artery disease) (HCC), PAF (paroxysmal atrial fibrillation) (HCC), Thyrotoxicosis, Tobacco abuse, and Type II diabetes mellitus (HCC).   Surgical History:   Past Surgical History:  Procedure Laterality Date  . CARDIAC CATHETERIZATION    . CHOLECYSTECTOMY    . CORONARY ARTERY BYPASS GRAFT     a. 1994 [place], [place]  . Left AKA    . RIGHT/LEFT HEART  CATH AND CORONARY ANGIOGRAPHY N/A 06/06/2020   Procedure: RIGHT/LEFT HEART CATH AND CORONARY ANGIOGRAPHY;  Surgeon: Iran OuchArida, Muhammad A, MD;  Location: [facility] INVASIVE CV LAB;  Service: Cardiovascular;  Laterality: N/A;     Social History:   reports that he has been smoking. He has a 62.00 pack-year smoking history. He has never used smokeless tobacco. He reports that he does not drink alcohol and does not use drugs.   Family History:  His family history includes CAD in his sister; Heart attack in his mother; Heart disease in his brother; Other in his father, sister, and sister.   Allergies No Known Allergies   Home Medications  Prior to Admission medications   Medication Sig Start Date End Date Taking? Authorizing Provider  acetaminophen (TYLENOL) 500 MG tablet Take 500 mg by mouth every 6 (six) hours as needed.    [provider]  albuterol (PROVENTIL) (2.5 MG/3ML) 0.083% nebulizer solution Take 2.5 mg by nebulization every 4 (four) hours as needed for wheezing or shortness of breath.    [provider]  albuterol (VENTOLIN HFA) 108 (90 Base) MCG/ACT inhaler Inhale 2 puffs into the lungs every 6 (six) hours as needed for wheezing or shortness of breath.     [provider]  aspirin EC 81 MG tablet Take 81  mg by mouth daily.    [provider]  atorvastatin (LIPITOR) 80 MG tablet Take 80 mg by mouth at bedtime.    [provider]  carvedilol (COREG) 3.125 MG tablet Take 1 tablet (3.125 mg total) by mouth 2 (two) times daily. 06/17/20 09/15/20  Alver SorrowWalker, Caitlin S, NP  DULoxetine (CYMBALTA) 30 MG capsule Take 30 mg by mouth 2 (two) times daily.    [provider]  ferrous sulfate 325 (65 FE) MG tablet Take 325 mg by mouth daily.    [provider]  Fluticasone-Salmeterol (ADVAIR) 250-50 MCG/DOSE AEPB Inhale 1 puff into the lungs 2 (two) times daily.    [provider]  furosemide (LASIX) 80 MG tablet Take 1 tablet (80 mg total) once daily in the morning 06/20/20   Alver SorrowWalker, Caitlin S, NP  hydrOXYzine (ATARAX/VISTARIL) 25 MG tablet Take 25 mg by mouth every 6 (six) hours as needed (allergy symptoms).    [provider]  isosorbide mononitrate (IMDUR) 30 MG 24 hr tablet Take 1 tablet (30 mg total) by mouth daily. 03/11/20 05/27/29  Marrion CoyZhang, Dekui, MD  ketotifen (ZADITOR) 0.025 % ophthalmic solution Place 1 drop into both eyes 2 (two) times daily as needed.     [provider]  lactulose (CHRONULAC) 10 GM/15ML solution Take 30 g by mouth every 12 (twelve) hours as needed for mild constipation.    [provider]  lamoTRIgine (LAMICTAL) 25 MG tablet Take 25 mg by mouth 2 (two) times daily.    [provider]  lidocaine (LIDODERM) 5 % Cut 1 patch in half and apply to amputated stump once daily 12 hours on, 12 hours off    [provider]  liver oil-zinc oxide (DESITIN) 40 % ointment Apply 1 application topically at bedtime as needed for irritation. To buttocks     [provider]  loperamide (IMODIUM) 2 MG capsule Take by mouth. 03/31/20   [provider]  LORazepam (ATIVAN) 0.5 MG tablet Take 0.5 mg by mouth 2 (two) times daily as needed for anxiety.    [provider]  Melatonin 3 MG TABS Take 1 tablet  by mouth at bedtime.  [provider]  metFORMIN (GLUCOPHAGE) 1000 MG tablet Take by mouth. 08/04/20   [provider]  metFORMIN (GLUCOPHAGE) 500 MG tablet Take 500 mg by mouth 2 (two) times daily with a meal.     [provider]  montelukast (SINGULAIR) 10 MG tablet Take 10 mg by mouth at bedtime.    [provider]  Multiple Vitamins-Minerals (PRESERVISION AREDS 2) CAPS Take 1 capsule by mouth 2 (two) times daily.    [provider]  nicotine (NICODERM CQ - DOSED IN MG/24 HOURS) 14 mg/24hr patch Place 14 mg onto the skin daily.    [provider]  nitroGLYCERIN (NITROSTAT) 0.4 MG SL tablet Place 0.4 mg under the tongue every 5 (five) minutes as needed for chest pain.    [provider]  pantoprazole (PROTONIX) 40 MG tablet Take 40 mg by mouth daily.    [provider]  potassium chloride SA (KLOR-CON) 20 MEQ tablet Take 1 tablet (20 mEq total) by mouth daily. 06/20/20 09/18/20  Loel Dubonnet, NP  Rivaroxaban (XARELTO) 15 MG TABS tablet Take 15 mg by mouth daily.    [provider]  sacubitril-valsartan (ENTRESTO) 24-26 MG Take 1 tablet by mouth 2 (two) times daily. 05/27/20   Loel Dubonnet, NP  senna (SENOKOT) 8.6 MG tablet Take 2 tablets by mouth at bedtime.    [provider]  tamsulosin (FLOMAX) 0.4 MG CAPS capsule Take 0.4 mg by mouth daily.    [provider]  traZODone (DESYREL) 50 MG tablet Take 25 mg by mouth at bedtime as needed for sleep.    [provider]  triamcinolone cream (KENALOG) 0.1 % Apply 1 application topically 2 (two) times daily.    [provider]     Critical care time: 35 minutes    Newell Coral DO Internal Medicine/Pediatrics Pulmonary and Critical Care Fellow PGY-7

## 2020-10-19 NOTE — Anesthesia Procedure Notes (Signed)
Procedure Name: Intubation Date/Time: 10/19/2020 12:00 PM Performed by: Mathews Argyle, CRNA Pre-anesthesia Checklist: Patient identified, Emergency Drugs available, Suction available and Patient being monitored Patient Re-evaluated:Patient Re-evaluated prior to induction Oxygen Delivery Method: Circle system utilized Preoxygenation: Pre-oxygenation with 100% oxygen Induction Type: IV induction Ventilation: Mask ventilation without difficulty Laryngoscope Size: McGraph and 4 Grade View: Grade I Tube type: Oral Number of attempts: 1 Airway Equipment and Method: Stylet Placement Confirmation: ETT inserted through vocal cords under direct vision,  positive ETCO2 and breath sounds checked- equal and bilateral Secured at: 21 cm Tube secured with: Tape Dental Injury: Teeth and Oropharynx as per pre-operative assessment

## 2020-10-19 NOTE — Anesthesia Preprocedure Evaluation (Addendum)
Anesthesia Evaluation  Patient identified by MRN, date of birth, ID band Patient awake    Reviewed: Allergy & Precautions, H&P , NPO status , Patient's Chart, lab work & pertinent test results  History of Anesthesia Complications Negative for: history of anesthetic complications  Airway Mallampati: II  TM Distance: >3 FB     Dental  (+) Upper Dentures   Pulmonary neg sleep apnea, COPD, Current Smoker and Patient abstained from smoking.,     + decreased breath sounds      Cardiovascular hypertension, (-) angina+ CAD, + CABG, + Peripheral Vascular Disease and +CHF  (-) dysrhythmias  Rhythm:irregular Rate:Normal     Neuro/Psych PSYCHIATRIC DISORDERS Depression negative neurological ROS     GI/Hepatic Neg liver ROS, GERD  ,Acute pancreatitis Obstructing common bile duct stone requiring ERCP   Endo/Other  diabetes  Renal/GU CRF and ARFRenal disease     Musculoskeletal   Abdominal   Peds  Hematology negative hematology ROS (+)   Anesthesia Other Findings Known ischemic CHF with reduced EF. Mildly elevated troponins consistent with demand ischemia, no drastic upward trend. No notable EKG changes. ICU not concerned about cardiac etiology, declined to get a cardiac consult. Echo results from today pending. Sepsis secondary to infected CBD stone, requiring low dose NE infusion. Has been receiving IVF. Per GI, ERCP is urgent and should not be further delayed for cardiac clearance. Pt states his CP and abdominal pain have resolved and he is feeling "much better."  Past Medical History: No date: Aortic stenosis     Comment:  a. Pt unaware of history but CT imaging 01/2019               consistent w/ AVR; b. 01/2019 Echo: Mild to mod AS. Mean               grad 89mmHg. No date: CAD (coronary artery disease)     Comment:  a. 1994 s/p CABG ([place]); b. Reports multiple               stress tests over the years w/o repeat cath;  c. 01/2019               MV: large, sev, fixed inf and inflat defect extending to               apex. No ischemia. EF 37%-->Med rx given atypical Ss and               pt wishes. No date: CKD (chronic kidney disease), stage III (HCC) No date: COPD (chronic obstructive pulmonary disease) (HCC) No date: Depression No date: Essential hypertension No date: GERD (gastroesophageal reflux disease) No date: Hyperlipidemia LDL goal <70 No date: Ischemic cardiomyopathy     Comment:  a. 01/2019 Echo: EF 40-45%, impaired relaxation. Mildly               dil LA. Sev mitral annular Ca2+. Mild to mod AS. No date: PAD (peripheral artery disease) ([place])     Comment:  a. 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 No date: PAF (paroxysmal atrial fibrillation) (HCC)     Comment:  a. CHA2DS2VASc = 4-->Xarelto 15mg  daily in setting of               CKD. No date: Thyrotoxicosis No date: Tobacco abuse No date: Type II diabetes mellitus ([place])  Past Surgical History: No date: CARDIAC CATHETERIZATION No date: CHOLECYSTECTOMY No date: CORONARY ARTERY BYPASS GRAFT     Comment:  a. 1994 -  [place], [place] No date: Left AKA 06/06/2020: RIGHT/LEFT HEART CATH AND CORONARY ANGIOGRAPHY; N/A     Comment:  Procedure: RIGHT/LEFT HEART CATH AND CORONARY               ANGIOGRAPHY;  Surgeon: Iran Ouch, MD;  Location:               [facility] INVASIVE CV LAB;  Service: Cardiovascular;                Laterality: N/A;  BMI    Body Mass Index: 21.70 kg/m      Reproductive/Obstetrics negative OB ROS                            Anesthesia Physical Anesthesia Plan  ASA: III  Anesthesia Plan: General ETT and Modified Rapid Sequence   Post-op Pain Management:    Induction:   PONV Risk Score and Plan: Ondansetron, Dexamethasone and Treatment may vary due to age or medical condition  Airway Management Planned:   Additional Equipment:   Intra-op Plan:   Post-operative Plan:   Informed Consent: I have reviewed the  patients History and Physical, chart, labs and discussed the procedure including the risks, benefits and alternatives for the proposed anesthesia with the patient or authorized representative who has indicated his/her understanding and acceptance.     Dental Advisory Given  Plan Discussed with: Anesthesiologist, CRNA and Surgeon  Anesthesia Plan Comments:         Anesthesia Quick Evaluation

## 2020-10-19 NOTE — Transfer of Care (Signed)
Immediate Anesthesia Transfer of Care Note  Patient: Shawn Jennings  Procedure(s) Performed: ENDOSCOPIC RETROGRADE CHOLANGIOPANCREATOGRAPHY (ERCP) WITH PROPOFOL (N/A )  Patient Location: PACU  Anesthesia Type:General  Level of Consciousness: awake, alert  and oriented  Airway & Oxygen Therapy: Patient Spontanous Breathing and Patient connected to nasal cannula oxygen  Post-op Assessment: Report given to RN and Post -op Vital signs reviewed and stable  Post vital signs: Reviewed  Last Vitals:  Vitals Value Taken Time  BP 95/82 10/19/20 1310  Temp    Pulse 105 10/19/20 1314  Resp 21 10/19/20 1314  SpO2 93 % 10/19/20 1314  Vitals shown include unvalidated device data.  Last Pain:  Vitals:   10/19/20 1110  TempSrc: Temporal  PainSc: 0-No pain      Patients Stated Pain Goal: 5 (10/19/20 0730)  Complications: No complications documented.

## 2020-10-20 ENCOUNTER — Encounter: Payer: Self-pay | Admitting: Gastroenterology

## 2020-10-20 ENCOUNTER — Inpatient Hospital Stay: Payer: Medicare Other

## 2020-10-20 DIAGNOSIS — K851 Biliary acute pancreatitis without necrosis or infection: Secondary | ICD-10-CM | POA: Diagnosis not present

## 2020-10-20 DIAGNOSIS — K8050 Calculus of bile duct without cholangitis or cholecystitis without obstruction: Secondary | ICD-10-CM | POA: Diagnosis not present

## 2020-10-20 LAB — COMPREHENSIVE METABOLIC PANEL
ALT: 206 U/L — ABNORMAL HIGH (ref 0–44)
AST: 70 U/L — ABNORMAL HIGH (ref 15–41)
Albumin: 2.4 g/dL — ABNORMAL LOW (ref 3.5–5.0)
Alkaline Phosphatase: 276 U/L — ABNORMAL HIGH (ref 38–126)
Anion gap: 10 (ref 5–15)
BUN: 34 mg/dL — ABNORMAL HIGH (ref 8–23)
CO2: 19 mmol/L — ABNORMAL LOW (ref 22–32)
Calcium: 7.1 mg/dL — ABNORMAL LOW (ref 8.9–10.3)
Chloride: 109 mmol/L (ref 98–111)
Creatinine, Ser: 1.95 mg/dL — ABNORMAL HIGH (ref 0.61–1.24)
GFR, Estimated: 35 mL/min — ABNORMAL LOW (ref >60)
Glucose, Bld: 241 mg/dL — ABNORMAL HIGH (ref 70–99)
Potassium: 3.8 mmol/L (ref 3.5–5.1)
Sodium: 138 mmol/L (ref 135–145)
Total Bilirubin: 2 mg/dL — ABNORMAL HIGH (ref 0.3–1.2)
Total Protein: 5.3 g/dL — ABNORMAL LOW (ref 6.5–8.1)

## 2020-10-20 LAB — CBC WITH DIFFERENTIAL/PLATELET
Abs Immature Granulocytes: 0.02 10*3/uL (ref 0.00–0.07)
Basophils Absolute: 0 10*3/uL (ref 0.0–0.1)
Basophils Relative: 0 %
Eosinophils Absolute: 0 10*3/uL (ref 0.0–0.5)
Eosinophils Relative: 0 %
HCT: 28.9 % — ABNORMAL LOW (ref 39.0–52.0)
Hemoglobin: 9.4 g/dL — ABNORMAL LOW (ref 13.0–17.0)
Immature Granulocytes: 0 %
Lymphocytes Relative: 7 %
Lymphs Abs: 0.4 10*3/uL — ABNORMAL LOW (ref 0.7–4.0)
MCH: 28.3 pg (ref 26.0–34.0)
MCHC: 32.5 g/dL (ref 30.0–36.0)
MCV: 87 fL (ref 80.0–100.0)
Monocytes Absolute: 0.3 10*3/uL (ref 0.1–1.0)
Monocytes Relative: 5 %
Neutro Abs: 5 10*3/uL (ref 1.7–7.7)
Neutrophils Relative %: 88 %
Platelets: 163 10*3/uL (ref 150–400)
RBC: 3.32 MIL/uL — ABNORMAL LOW (ref 4.22–5.81)
RDW: 14.4 % (ref 11.5–15.5)
WBC: 5.7 10*3/uL (ref 4.0–10.5)
nRBC: 0 % (ref 0.0–0.2)

## 2020-10-20 LAB — GLUCOSE, CAPILLARY
Glucose-Capillary: 211 mg/dL — ABNORMAL HIGH (ref 70–99)
Glucose-Capillary: 203 mg/dL — ABNORMAL HIGH (ref 70–99)
Glucose-Capillary: 328 mg/dL — ABNORMAL HIGH (ref 70–99)
Glucose-Capillary: 324 mg/dL — ABNORMAL HIGH (ref 70–99)
Glucose-Capillary: 358 mg/dL — ABNORMAL HIGH (ref 70–99)
Glucose-Capillary: 335 mg/dL — ABNORMAL HIGH (ref 70–99)
Glucose-Capillary: 345 mg/dL — ABNORMAL HIGH (ref 70–99)

## 2020-10-20 LAB — TROPONIN I (HIGH SENSITIVITY): Troponin I (High Sensitivity): 31 ng/L — ABNORMAL HIGH (ref <18)

## 2020-10-20 LAB — LACTIC ACID, PLASMA: Lactic Acid, Venous: 2.4 mmol/L (ref 0.5–1.9)

## 2020-10-20 LAB — PROCALCITONIN: Procalcitonin: 3.61 ng/mL

## 2020-10-20 LAB — HEPARIN LEVEL (UNFRACTIONATED): Heparin Unfractionated: 0.27 IU/mL — ABNORMAL LOW (ref 0.30–0.70)

## 2020-10-20 MED ORDER — HEPARIN (PORCINE) 25000 UT/250ML-% IV SOLN: 1150.0000 [IU]/h | INTRAVENOUS | Status: DC

## 2020-10-20 MED ORDER — MIDODRINE HCL 5 MG PO TABS
5.0000 mg | ORAL_TABLET | Freq: Three times a day (TID) | ORAL | Status: DC
  Administered 2020-10-20 – 2020-10-25 (×16): 5 mg via ORAL
  Filled 2020-10-20 (×16): qty 1

## 2020-10-20 MED ORDER — HEPARIN (PORCINE) 25000 UT/250ML-% IV SOLN
1000.0000 [IU]/h | INTRAVENOUS | Status: DC
  Administered 2020-10-20: 15:00:00 1000 [IU]/h via INTRAVENOUS
  Filled 2020-10-20: qty 250

## 2020-10-20 MED ORDER — AMIODARONE HCL IN DEXTROSE 360-4.14 MG/200ML-% IV SOLN
60.0000 mg/h | INTRAVENOUS | Status: AC
  Administered 2020-10-20: 18:00:00 60 mg/h via INTRAVENOUS
  Filled 2020-10-20: qty 200

## 2020-10-20 MED ORDER — DILTIAZEM HCL 25 MG/5ML IV SOLN: 10.0000 mg | Freq: Once | INTRAVENOUS | Status: DC

## 2020-10-20 MED ORDER — SODIUM CHLORIDE 0.9 % IV SOLN: INTRAVENOUS | Status: DC

## 2020-10-20 MED ORDER — INSULIN ASPART 100 UNIT/ML ~~LOC~~ SOLN
0.0000 [IU] | Freq: Three times a day (TID) | SUBCUTANEOUS | Status: DC
  Administered 2020-10-20: 17:00:00 11 [IU] via SUBCUTANEOUS
  Administered 2020-10-21: 15 [IU] via SUBCUTANEOUS
  Administered 2020-10-21: 4 [IU] via SUBCUTANEOUS
  Administered 2020-10-21: 7 [IU] via SUBCUTANEOUS
  Administered 2020-10-22: 3 [IU] via SUBCUTANEOUS
  Administered 2020-10-22: 4 [IU] via SUBCUTANEOUS
  Administered 2020-10-22: 7 [IU] via SUBCUTANEOUS
  Administered 2020-10-23: 3 [IU] via SUBCUTANEOUS
  Administered 2020-10-23: 7 [IU] via SUBCUTANEOUS
  Administered 2020-10-24 (×2): 4 [IU] via SUBCUTANEOUS
  Administered 2020-10-24 – 2020-10-25 (×2): 7 [IU] via SUBCUTANEOUS
  Administered 2020-10-25: 11 [IU] via SUBCUTANEOUS
  Filled 2020-10-20 (×14): qty 1

## 2020-10-20 MED ORDER — METOPROLOL TARTRATE 5 MG/5ML IV SOLN: 5.0000 mg | Freq: Once | INTRAVENOUS | Status: AC

## 2020-10-20 MED ORDER — AMIODARONE HCL IN DEXTROSE 360-4.14 MG/200ML-% IV SOLN
30.0000 mg/h | INTRAVENOUS | Status: DC
  Administered 2020-10-20: 30 mg/h via INTRAVENOUS
  Filled 2020-10-20: qty 200

## 2020-10-20 MED ORDER — PROMETHAZINE HCL 25 MG/ML IJ SOLN: 12.5000 mg | Freq: Four times a day (QID) | INTRAMUSCULAR | Status: DC | PRN

## 2020-10-20 MED ORDER — INSULIN ASPART 100 UNIT/ML ~~LOC~~ SOLN
0.0000 [IU] | Freq: Every day | SUBCUTANEOUS | Status: DC
  Administered 2020-10-20 – 2020-10-21 (×2): 4 [IU] via SUBCUTANEOUS
  Administered 2020-10-24: 3 [IU] via SUBCUTANEOUS
  Filled 2020-10-20 (×3): qty 1

## 2020-10-20 MED ORDER — ADULT MULTIVITAMIN W/MINERALS CH
1.0000 | ORAL_TABLET | Freq: Every day | ORAL | Status: DC
  Administered 2020-10-21 – 2020-10-25 (×5): 1 via ORAL
  Filled 2020-10-20 (×5): qty 1

## 2020-10-20 MED ORDER — LACTATED RINGERS IV SOLN: INTRAVENOUS | Status: DC

## 2020-10-20 MED ORDER — ENSURE ENLIVE PO LIQD
237.0000 mL | Freq: Three times a day (TID) | ORAL | Status: DC
  Administered 2020-10-20 – 2020-10-25 (×14): 237 mL via ORAL

## 2020-10-20 MED ORDER — INSULIN DETEMIR 100 UNIT/ML ~~LOC~~ SOLN
8.0000 [IU] | Freq: Every day | SUBCUTANEOUS | Status: DC
  Administered 2020-10-20: 23:00:00 8 [IU] via SUBCUTANEOUS
  Filled 2020-10-20 (×2): qty 0.08

## 2020-10-20 MED ORDER — METOPROLOL TARTRATE 5 MG/5ML IV SOLN
INTRAVENOUS | Status: AC
  Administered 2020-10-20: 14:00:00 5 mg via INTRAVENOUS
  Filled 2020-10-20: qty 5

## 2020-10-20 MED ORDER — AMIODARONE IV BOLUS ONLY 150 MG/100ML
150.0000 mg | Freq: Once | INTRAVENOUS | Status: AC
  Administered 2020-10-20: 16:00:00 150 mg via INTRAVENOUS
  Filled 2020-10-20: qty 100

## 2020-10-20 MED ORDER — METOPROLOL TARTRATE 25 MG PO TABS
25.0000 mg | ORAL_TABLET | Freq: Two times a day (BID) | ORAL | Status: DC
  Administered 2020-10-20: 23:00:00 25 mg via ORAL
  Filled 2020-10-20: qty 1

## 2020-10-20 NOTE — Progress Notes (Signed)
74 year old male with history of paroxysmal A. fib who was initially admitted under [facility] service had to be moved to ICU after he developed septic shock from ascending colitis.  Underwent ERCP with a stent placement.  Currently blood pressure stable without pressors.  Stable for transfer to our service on 10/21/2020.  Hospital course also remarkable for A. fib with RVR, AKI on CKD stage III

## 2020-10-20 NOTE — Consult Note (Addendum)
ANTICOAGULATION CONSULT NOTE  Pharmacy Consult for Heparin Infusion Indication: atrial fibrillation  Patient Measurements: Heparin Dosing Weight: 72.6 kg Labs: Recent Labs    10/18/20 0212 10/18/20 0453 10/18/20 0958 10/18/20 1300 10/19/20 0633 10/19/20 1459 10/20/20 0424 10/20/20 1424 10/20/20 2225  HGB  --  11.7*  --   --  10.1*  --  9.4*  --   --   HCT  --  35.0*  --   --  30.4*  --  28.9*  --   --   PLT  --  214  --   --  184  --  163  --   --   APTT  --   --   --   --   --  41*  --   --   --   LABPROT 17.2*  --   --   --   --  14.3  --   --   --   INR 1.5*  --   --   --   --  1.2  --   --   --   HEPARINUNFRC  --   --   --   --   --  <0.10*  --   --  0.27*  CREATININE  --  1.73* 1.77*  --  1.70*  --  1.95*  --   --   TROPONINIHS 29*  --  40* 43*  --   --   --  31*  --     Estimated Creatinine Clearance: 33.1 mL/min (A) (by C-G formula based on SCr of 1.95 mg/dL (H)).   Medical History: Past Medical History:  Diagnosis Date  . Aortic stenosis    a. Pt unaware of history but CT imaging 01/2019 consistent w/ AVR; b. 01/2019 Echo: Mild to mod AS. Mean grad 37mmHg.  Marland Kitchen CAD (coronary artery disease)    a. 1994 s/p CABG ([place]); b. Reports multiple stress tests over the years w/o repeat cath; c. 01/2019 MV: large, sev, fixed inf and inflat defect extending to apex. No ischemia. EF 37%-->Med rx given atypical Ss and pt wishes.  . CKD (chronic kidney disease), stage III ([place])   . COPD (chronic obstructive pulmonary disease) ([place])   . Depression   . Essential hypertension   . GERD (gastroesophageal reflux disease)   . Hyperlipidemia LDL goal <70   . Ischemic cardiomyopathy    a. 01/2019 Echo: EF 40-45%, impaired relaxation. Mildly dil LA. Sev mitral annular Ca2+. Mild to mod AS.  Marland Kitchen PAD (peripheral artery disease) ([place])    a. 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  Marland Kitchen PAF (paroxysmal atrial fibrillation) (HCC)    a. CHA2DS2VASc = 4-->Xarelto 15mg  daily in setting of CKD.  Marland Kitchen Thyrotoxicosis   .  Tobacco abuse   . Type II diabetes mellitus (HCC)     Medications:  Rivaroxaban 15 mg PTA  Assessment: Patient is a 74 y/o M with medical history as above including atrial fibrillation on Xarelto who was admitted 12/27 with choledocholithiasis and concern for cholangitis. He is status post ERCP with biliary sphincterectomy and stenting of the CBD on 12/29. Pharmacy has been consulted to initiate heparin infusion for atrial fibrillation.  Baseline labs from 12/29 with aPTT prolonged to 41s, INR 1.2, HL < 0.10. CBC is notable for worsening anemia and decreasing platelet count.   Goal of Therapy:  Heparin level 0.3-0.7 units/ml Monitor platelets by anticoagulation protocol: Yes   12/30 2225 HL = 0.27, subtherapeutic   Plan:  --  Will increase heparin infusion to 1150 units/hr, no bolus given recent invasive procedure --Will check HL 8 hours --Daily CBC per protocol  Otelia Sergeant, PharmD, [facility] 10/20/2020 11:49 PM

## 2020-10-20 NOTE — Consult Note (Signed)
ANTICOAGULATION CONSULT NOTE  Pharmacy Consult for Heparin Infusion Indication: atrial fibrillation  Patient Measurements: Heparin Dosing Weight: 72.6 kg Labs: Recent Labs    10/18/20 0212 10/18/20 0453 10/18/20 0958 10/18/20 1300 10/19/20 0633 10/19/20 1459 10/20/20 0424  HGB  --  11.7*  --   --  10.1*  --  9.4*  HCT  --  35.0*  --   --  30.4*  --  28.9*  PLT  --  214  --   --  184  --  163  APTT  --   --   --   --   --  41*  --   LABPROT 17.2*  --   --   --   --  14.3  --   INR 1.5*  --   --   --   --  1.2  --   HEPARINUNFRC  --   --   --   --   --  <0.10*  --   CREATININE  --  1.73* 1.77*  --  1.70*  --  1.95*  TROPONINIHS 29*  --  40* 43*  --   --   --     Estimated Creatinine Clearance: 33.1 mL/min (A) (by C-G formula based on SCr of 1.95 mg/dL (H)).   Medical History: Past Medical History:  Diagnosis Date  . Aortic stenosis    a. Pt unaware of history but CT imaging 01/2019 consistent w/ AVR; b. 01/2019 Echo: Mild to mod AS. Mean grad .  Marland Kitchen CAD (coronary artery disease)    a. 1994 s/p CABG [place], [place]); b. Reports multiple stress tests over the years w/o repeat cath; c. 01/2019 MV: large, sev, fixed inf and inflat defect extending to apex. No ischemia. EF 37%-->Med rx given atypical Ss and pt wishes.  . CKD (chronic kidney disease), stage III (HCC)   . COPD (chronic obstructive pulmonary disease) (HCC)   . Depression   . Essential hypertension   . GERD (gastroesophageal reflux disease)   . Hyperlipidemia LDL goal <70   . Ischemic cardiomyopathy    a. 01/2019 Echo: EF 40-45%, impaired relaxation. Mildly dil LA. Sev mitral annular Ca2+. Mild to mod AS.  Marland Kitchen PAD (peripheral artery disease) (HCC)    a. 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  Marland Kitchen PAF (paroxysmal atrial fibrillation) (HCC)    a. CHA2DS2VASc = 4-->Xarelto 15mg  daily in setting of CKD.  Thyrotoxicosis   . Tobacco abuse   . Type II diabetes mellitus (HCC)     Medications:  Rivaroxaban 15 mg PTA  Assessment: Patient is  a 74 y/o M with medical history as above including atrial fibrillation on Xarelto who was admitted 12/27 with choledocholithiasis and concern for cholangitis. He is status post ERCP with biliary sphincterectomy and stenting of the CBD on 12/29. Pharmacy has been consulted to initiate heparin infusion for atrial fibrillation.  Baseline labs from 12/29 with aPTT prolonged to 41s, INR 1.2, HL < 0.10. CBC is notable for worsening anemia and decreasing platelet count.   Goal of Therapy:  Heparin level 0.3-0.7 units/ml Monitor platelets by anticoagulation protocol: Yes   Plan:  --Will start heparin infusion at 1000 units/hr, no bolus given recent invasive procedure --Will check HL 8 hours after initiation of infusion --Daily CBC per protocol  1/30 10/20/2020,2:02 PM

## 2020-10-20 NOTE — Progress Notes (Signed)
[name] , MD [address] [place], [place], [place], [address] [address] [place], [place], [place], [address] Phone: [phone]  Fax: [phone]   Shawn Jennings is being followed for Gall stone pancreatitis and acute cholangitis  Day 2 of follow up   Subjective:   Feels well , minimal epigastric pain, tolerated lunch    Objective: Vital signs in last 24 hours: Vitals:   10/20/20 0300 10/20/20 0400 10/20/20 0500 10/20/20 0600  BP: 96/69 106/61 104/62 105/67  Pulse: 93 (!) 54 (!) 51 (!) 50  Resp: (!) 22 18 17 19   Temp:      TempSrc:      SpO2: 95% 93% 95% 93%  Weight:      Height:       Weight change:   Intake/Output Summary (Last 24 hours) at 10/20/2020 1104 Last data filed at 10/20/2020 1036 Gross per 24 hour  Intake 1201.33 ml  Output 125 ml  Net 1076.33 ml     Exam:  Abdomen: soft, nontender, normal bowel sounds   Lab Results: @LABTEST2 @ Micro Results: Recent Results (from the past 240 hour(s))  Resp Panel by RT-PCR (Flu A&B, Covid) Nasopharyngeal Swab     Status: None   Collection Time: 10/17/20  9:19 PM   Specimen: Nasopharyngeal Swab; Nasopharyngeal(NP) swabs in vial transport medium  Result Value Ref Range Status   SARS Coronavirus 2 by RT PCR NEGATIVE NEGATIVE Final    Comment: (NOTE) SARS-CoV-2 target nucleic acids are NOT DETECTED.  The SARS-CoV-2 RNA is generally detectable in upper respiratory specimens during the acute phase of infection. The lowest concentration of SARS-CoV-2 viral copies this assay can detect is 138 copies/mL. A negative result does not preclude SARS-Cov-2 infection and should not be used as the sole basis for treatment or other patient management decisions. A negative result may occur with  improper specimen collection/handling, submission of specimen other than nasopharyngeal swab, presence of viral mutation(s) within the areas targeted by this assay, and inadequate number of viral copies(<138  copies/mL). A negative result must be combined with clinical observations, patient history, and epidemiological information. The expected result is Negative.  Fact Sheet for Patients:  EntrepreneurPulse.com.au  Fact Sheet for Healthcare Providers:  IncredibleEmployment.be  This test is no t yet approved or cleared by the [place] FDA and  has been authorized for detection and/or diagnosis of SARS-CoV-2 by FDA under an Emergency Use Authorization (EUA). This EUA will remain  in effect (meaning this test can be used) for the duration of the COVID-19 declaration under Section 564(b)(1) of the Act, 21 U.S.C.section 360bbb-3(b)(1), unless the authorization is terminated  or revoked sooner.       Influenza A by PCR NEGATIVE NEGATIVE Final   Influenza B by PCR NEGATIVE NEGATIVE Final    Comment: (NOTE) The Xpert Xpress SARS-CoV-2/FLU/RSV plus assay is intended as an aid in the diagnosis of influenza from Nasopharyngeal swab specimens and should not be used as a sole basis for treatment. Nasal washings and aspirates are unacceptable for Xpert Xpress SARS-CoV-2/FLU/RSV testing.  Fact Sheet for Patients: EntrepreneurPulse.com.au  Fact Sheet for Healthcare Providers: IncredibleEmployment.be  This test is not yet approved or cleared by the [place] FDA and has been authorized for detection and/or diagnosis of SARS-CoV-2 by FDA under an Emergency Use Authorization (EUA). This EUA will remain in effect (meaning this test can be used) for the duration of the COVID-19 declaration under Section 564(b)(1) of the Act, 21 U.S.C. section 360bbb-3(b)(1), unless  the authorization is terminated or revoked.  Performed at [facility], [address] [place], [place] [address]   MRSA PCR Screening     Status: Abnormal   Collection Time: 10/18/20 12:17 AM   Specimen: Nasal Mucosa; Nasopharyngeal  Result  Value Ref Range Status   MRSA by PCR POSITIVE (A) NEGATIVE Final    Comment:        The GeneXpert MRSA Assay (FDA approved for NASAL specimens only), is one component of a comprehensive MRSA colonization surveillance program. It is not intended to diagnose MRSA infection nor to guide or monitor treatment for MRSA infections. RESULT CALLED TO, READ BACK BY AND VERIFIED WITH: MARSHA HATCH @0204  ON 10/18/20 SKL Performed at [facility], [address] [place], [place] [address]   Culture, blood (routine x 2)     Status: None (Preliminary result)   Collection Time: 10/18/20  4:41 AM   Specimen: BLOOD  Result Value Ref Range Status   Specimen Description BLOOD LEFT WRIST  Final   Special Requests   Final    BOTTLES DRAWN AEROBIC AND ANAEROBIC Blood Culture adequate volume   Culture   Final    NO GROWTH 2 DAYS Performed at [facility], [address], [place], [place] [address]    Report Status PENDING  Incomplete  Culture, blood (routine x 2)     Status: None (Preliminary result)   Collection Time: 10/18/20  4:53 AM   Specimen: BLOOD  Result Value Ref Range Status   Specimen Description BLOOD LEFT ASSIST CONTROL  Final   Special Requests   Final    BOTTLES DRAWN AEROBIC AND ANAEROBIC Blood Culture adequate volume   Culture   Final    NO GROWTH 2 DAYS Performed at [facility], [address], [place], [place] [address]    Report Status PENDING  Incomplete  Urine Culture     Status: None   Collection Time: 10/18/20  9:02 AM   Specimen: Urine, Random  Result Value Ref Range Status   Specimen Description   Final    URINE, RANDOM Performed at [facility], [address] [place], [place] [address]    Special Requests   Final    NONE Performed at [facility], [address], [place],  [address]    Culture   Final    NO GROWTH Performed at [facility], [place] [address], [place],  [address]    Report  Status 10/19/2020 FINAL  Final   Studies/Results: DG C-Arm 1-60 Min-No Report  Result Date: 10/19/2020 Fluoroscopy was utilized by the requesting physician.  No radiographic interpretation.   ECHOCARDIOGRAM COMPLETE  Result Date: 10/19/2020    ECHOCARDIOGRAM REPORT   Patient Name:   [facility] Date of Exam: 10/19/2020 Medical Rec #:  [id number]      Height:       72.0 in Accession #:    [id number]     Weight:       160.0 lb Date of Birth:  Apr 10, 1946      BSA:          1.938 m Patient Age:    86 years       BP:           113/58 mmHg Patient Gender: M              HR:           64 bpm. Exam Location:  [facility]  Procedure: 2D Echo, Color Doppler and Cardiac Doppler Indications:     Shock  History:         Patient has prior history of Echocardiogram examinations, most                  recent 05/05/2020. COPD; Risk Factors:Diabetes and Hypertension.                  PAF, aortic stenosis.  Sonographer:     Sherrie Sport RDCS (AE) Referring Phys:  [id number] Tyna Jaksch Diagnosing Phys: Bartholome Bill MD IMPRESSIONS  1. Left ventricular ejection fraction, by estimation, is 50 to 55%. The left ventricle has low normal function. The left ventricle has no regional wall motion abnormalities. The left ventricular internal cavity size was mildly dilated. Left ventricular diastolic parameters were normal.  2. Right ventricular systolic function is normal. The right ventricular size is mildly enlarged.  3. Left atrial size was mildly dilated.  4. Right atrial size was mildly dilated.  5. The mitral valve was not well visualized. Trivial mitral valve regurgitation.  6. The aortic valve was not well visualized. Aortic valve regurgitation is trivial. Mild aortic valve stenosis. FINDINGS  Left Ventricle: Left ventricular ejection fraction, by estimation, is 50 to 55%. The left ventricle has low normal function. The left ventricle has no regional wall motion abnormalities. The left ventricular internal cavity size was mildly  dilated. There is no left ventricular hypertrophy. Left ventricular diastolic parameters were normal. Right Ventricle: The right ventricular size is mildly enlarged. No increase in right ventricular wall thickness. Right ventricular systolic function is normal. Left Atrium: Left atrial size was mildly dilated. Right Atrium: Right atrial size was mildly dilated. Pericardium: There is no evidence of pericardial effusion. Mitral Valve: The mitral valve was not well visualized. Trivial mitral valve regurgitation. MV peak gradient, 9.5 mmHg. The mean mitral valve gradient is 3.0 mmHg. Tricuspid Valve: The tricuspid valve is not well visualized. Tricuspid valve regurgitation is trivial. Aortic Valve: The aortic valve was not well visualized. Aortic valve regurgitation is trivial. Mild aortic stenosis is present. Aortic valve mean gradient measures 18.7 mmHg. Aortic valve peak gradient measures 32.0 mmHg. Aortic valve area, by VTI measures 1.11 cm. Pulmonic Valve: The pulmonic valve was not assessed. Pulmonic valve regurgitation is not visualized. Aorta: The aortic root was not well visualized. IAS/Shunts: The interatrial septum was not assessed.  LEFT VENTRICLE PLAX 2D LVIDd:         5.64 cm      Diastology LVIDs:         3.62 cm      LV e' medial:    5.22 cm/s LV PW:         1.35 cm      LV E/e' medial:  26.2 LV IVS:        0.94 cm      LV e' lateral:   7.29 cm/s LVOT diam:     2.10 cm      LV E/e' lateral: 18.8 LV SV:         69 LV SV Index:   35 LVOT Area:     3.46 cm  LV Volumes (MOD) LV vol d, MOD A2C: 97.4 ml LV vol d, MOD A4C: 141.0 ml LV vol s, MOD A2C: 54.6 ml LV vol s, MOD A4C: 66.6 ml LV SV MOD A2C:     42.8 ml LV SV MOD A4C:     141.0 ml LV SV MOD  BP:      58.5 ml RIGHT VENTRICLE RV Basal diam:  3.39 cm RV S prime:     9.68 cm/s TAPSE (M-mode): 3.8 cm LEFT ATRIUM             Index       RIGHT ATRIUM           Index LA diam:        4.60 cm 2.37 cm/m  RA Area:     27.90 cm LA Vol (A2C):   88.6 ml 45.72 ml/m  RA Volume:   97.60 ml  50.37 ml/m LA Vol (A4C):   70.5 ml 36.38 ml/m LA Biplane Vol: 78.6 ml 40.56 ml/m  AORTIC VALVE                    PULMONIC VALVE AV Area (Vmax):    1.05 cm     PV Vmax:        0.83 m/s AV Area (Vmean):   0.97 cm     PV Peak grad:   2.7 mmHg AV Area (VTI):     1.11 cm     RVOT Peak grad: 3 mmHg AV Vmax:           283.00 cm/s AV Vmean:          204.000 cm/s AV VTI:            0.620 m AV Peak Grad:      32.0 mmHg AV Mean Grad:      18.7 mmHg LVOT Vmax:         85.50 cm/s LVOT Vmean:        57.100 cm/s LVOT VTI:          0.198 m LVOT/AV VTI ratio: 0.32  AORTA Ao Root diam: 3.30 cm MITRAL VALVE                TRICUSPID VALVE MV Area (PHT): 2.78 cm     TR Peak grad:   25.8 mmHg MV Peak grad:  9.5 mmHg     TR Vmax:        254.00 cm/s MV Mean grad:  3.0 mmHg MV Vmax:       1.54 m/s     SHUNTS MV Vmean:      82.6 cm/s    Systemic VTI:  0.20 m MV Decel Time: 273 msec     Systemic Diam: 2.10 cm MV E velocity: 137.00 cm/s MV A velocity: 127.00 cm/s MV E/A ratio:  1.08 Harold Hedge MD Electronically signed by Harold Hedge MD Signature Date/Time: 10/19/2020/12:32:55 PM    Final    Medications: I have reviewed the patient's current medications. Scheduled Meds: . atorvastatin  80 mg Oral QHS  . Chlorhexidine Gluconate Cloth  6 each Topical Q0600  . docusate sodium  100 mg Oral BID  . DULoxetine  30 mg Oral BID  . feeding supplement  237 mL Oral TID BM  . ferrous sulfate  325 mg Oral Daily  . insulin aspart  0-9 Units Subcutaneous Q4H  . lamoTRIgine  25 mg Oral BID  . melatonin  2.5 mg Oral QHS  . midodrine  5 mg Oral TID WC  . mometasone-formoterol  2 puff Inhalation BID  . montelukast  10 mg Oral QHS  . [START ON 10/21/2020] multivitamin with minerals  1 tablet Oral Daily  . multivitamin-lutein  1 capsule Oral BID  . mupirocin ointment  1 application Nasal BID  . nicotine  14 mg Transdermal Daily  . pantoprazole (PROTONIX) IV  40 mg Intravenous Q24H  . senna  2 tablet Oral QHS  .  tamsulosin  0.4 mg Oral Daily   Continuous Infusions: . sodium chloride Stopped (10/18/20 0521)  . norepinephrine (LEVOPHED) Adult infusion 6 mcg/min (10/20/20 0000)  . piperacillin-tazobactam (ZOSYN)  IV 3.375 g (10/20/20 [id number])  . vasopressin 0.03 Units/min (10/20/20 0553)   PRN Meds:.acetaminophen **OR** acetaminophen, albuterol, HYDROmorphone (DILAUDID) injection, hydrOXYzine, lactulose, LORazepam, magnesium hydroxide, nitroGLYCERIN, ondansetron **OR** ondansetron (ZOFRAN) IV, traZODone CMP Latest Ref Rng & Units 10/20/2020 10/19/2020 10/18/2020  Glucose 70 - 99 mg/dL 241(H) 138(H) 176(H)  BUN 8 - 23 mg/dL 34(H) 27(H) 28(H)  Creatinine 0.61 - 1.24 mg/dL 1.95(H) 1.70(H) 1.77(H)  Sodium 135 - 145 mmol/L 138 139 142  Potassium 3.5 - 5.1 mmol/L 3.8 3.5 3.1(L)  Chloride 98 - 111 mmol/L 109 108 109  CO2 22 - 32 mmol/L 19(L) 20(L) 20(L)  Calcium 8.9 - 10.3 mg/dL 7.1(L) 7.1(L) 6.8(L)  Total Protein 6.5 - 8.1 g/dL 5.3(L) 5.3(L) -  Total Bilirubin 0.3 - 1.2 mg/dL 2.0(H) 3.6(H) -  Alkaline Phos 38 - 126 U/L 276(H) 288(H) -  AST 15 - 41 U/L 70(H) 135(H) -  ALT 0 - 44 U/L 206(H) 275(H) -     Assessment: Active Problems:   Choledocholithiasis   Protein-calorie malnutrition, severe   Cholangitis  Jimy Boughter is a 74 y.o. y/o male with history of CAD, COPD, GERD, diabetes admitted with chest pain, AKI, found to have choledocholithiasis on imaging, total bilirubin of 1.8 and elevated transaminases. Diagnosed with gall stone pancreatitis, acute cholangitis, S/P ERCP 10/19/2020 with extraction of sludge and placement of stent .LFT's improving , AKI worsening , off Levophed   Plan  1.  Complete course of antibiotics 2.  IV fluids. May require more hydration  3.  Gradually advance diet as tolerated.  4.  Resume Eloquis in 1-2 days 5. Repeat ERCP in 2 months to remove stent    I will sign off.  Please call me if any further GI concerns or questions.  We would like to thank you for the  opportunity to participate in the care of Tyyon Didonato.   LOS: 3 days   [name], MD 10/20/2020, 11:04 AM

## 2020-10-20 NOTE — Progress Notes (Signed)
Some bad nausea and abd pain after trial of PO. Back in Afib/RVR Zofran only partially successful Okay for dilaudid, will add low dose of phenergan Check KUB and lipase for completeness sake Will downgrade diet to clear liquid  Myrla Halsted MD PCCM

## 2020-10-20 NOTE — Anesthesia Postprocedure Evaluation (Signed)
Anesthesia Post Note  Patient: Shawn Jennings  Procedure(s) Performed: ENDOSCOPIC RETROGRADE CHOLANGIOPANCREATOGRAPHY (ERCP) WITH PROPOFOL (N/A )  Patient location during evaluation: SICU Anesthesia Type: General Level of consciousness: awake and alert Pain management: pain level controlled Vital Signs Assessment: post-procedure vital signs reviewed and stable Respiratory status: spontaneous breathing Cardiovascular status: stable Postop Assessment: no apparent nausea or vomiting Anesthetic complications: no   No complications documented.   Last Vitals:  Vitals:   10/20/20 0500 10/20/20 0600  BP: 104/62 105/67  Pulse: (!) 51 (!) 50  Resp: 17 19  Temp:    SpO2: 95% 93%    Last Pain:  Vitals:   10/20/20 0000  TempSrc: Temporal  PainSc:                  Jules Schick

## 2020-10-20 NOTE — Progress Notes (Addendum)
10/20/2020   I have seen and evaluated the patient for ascending cholangitis.  S:  Post ERCP, was doing okay except had Afib/RVR and chest discomfort this afternoon relieved with metoprolol x 1. Tolerating diet Denies abd pain  O: Blood pressure 100/71, pulse (!) 142, temperature 98.3 F (36.8 C), temperature source Temporal, resp. rate (!) 21, height 6' (1.829 m), weight 72.6 kg, SpO2 92 %.  No acute distress MM dry Lungs clear Abd soft Heart sounds now regular (converted to sinus rate 60s after metoprolol) L AKA noted No peripheral edema  Renal function worse Pct improved Echo WNL WBC better LFTs better  A:  -Septic shock secondary to gallstone ascending cholangitis s/p ERCP with sphincterotomy and stent placement -Afib with RVR x 1 12/30, resolved with IV BB push -AKI on CKD3a, suspect pre-renal -DM with hyperglycemia exacerbated by gallstone pancreatitis  P:  - Zosyn vs. augmentin x 7 days total reasonable - Gentle NS, restart home oral BB (metoprolol instead of coreg for less alpha effect) - Diabetic diet, strengthen insulin to basal with SSI - Heparin gtt, restart NoAC prior to discharge - PT/OT - Keep as SDU 1 more day then likely tele after, appreciate [facility] taking over care starting 12/31  10/20/2020 Myrla Halsted MD      NAME:  Butch Penny, MRN:  [id number], DOB:  September 20, 1946, LOS: 3 ADMISSION DATE:  10/17/2020, CONSULTATION DATE: 10/18/2020 REFERRING MD: Webb Silversmith, NP, CHIEF COMPLAINT: Chest Pain   Brief History:  74 yo male admitted with septic shock secondary choledocholithiasis with stone in the distal common bile duct requiring levophed gtt   History of Present Illness:  This is a 74 yo male who presented to [facility] ER on 12/28 with c/o chest pain which resolved after he took nitroglycerin, onset of chest pain 2 hrs prior to ER presentation.  Upon arrival to the ER pt reported 8/10 intensity severe nonradiating epigastric pain along with  nausea/vomiting onset 2 weeks prior to presentation.  He had a LHC on 05/2020 with 100% stenosed circumflex, LAD, and RCA, with intact grafts.  ER lab results revealed K+ 3.4, CO2 20, glucose 120, BUN 27, creatinine 1.73, pct 20.65, wbc 17.2, hgb 11.7, and abg pH 7.38/pCO2 31/pO2 93/bicarb 18.3. MRSA PCR positive. CXR and COVID-19/Influenza PCR negative. CT Abd Pelvis revealed choledocholithiasis with 6 x 5 mm stone in the distal common bile duct.  Pt hypotensive sbp 70's and tachycardic hr 117 meeting sepsis criteria.  He received 3.2L LR bolus along with zosyn in the ER with initial improvement of bp. He was admitted to the [facility] unit per hospitalist team with gastroenterology and general surgery consults pending.  However, on 12/28 pt developed hypotension requiring transfer to the stepdown unit and levophed gtt initiated.  PCCM team consulted to assist with management.    Interval updates:  10-19-20 no significant events overnight.  Patient able to get ERCP today.  States that afterwards he feels much better.  Patient is having some A. fib with RVR but rate sustained less than 150.  As patient is on Levophed drip will hold off on any beta-blockers or calcium channel blockers.  If sustains above 150 will initiate amiodarone.  12/30-21: Pt weaned off vasopressors.  Had episode of A-fib w/ RVR with associated chest pain which resolved with Metoprolol 5 mg IV push x1  Past Medical History:  Type II Diabetes Mellitus Tobacco Abuse Thyrotoxicosis Paroxysmal Atrial Fibrillation (on xarelto) PAD Ischemic Cardiomyopathy (Echo 04/2020 EF 25-30% bioprosthetic aortic valve worked  appropriately) HLD GERD  Essential HTN Depression COPD Stage III CKD CAD s/p CABG 1994 Aortic Stenosis   Significant Hospital Events:  12/27: Pt admitted to the medsurg unit with sepsis secondary to choledocholithiasis with stone in the distal common bile duct. 12/28: Pt with worsening hypotension requiring transfer to  the stepdown unit for pressors   Consults:  Intensivist  Gastroenterology  General Surgery   Procedures:  Right IJ CVL 12/28>> 12/29: ERCP  Significant Diagnostic Tests:  12/27: CT Abd Pelvis revealed choledocholithiasis with 6 x 5 mm stone in the distal common bile duct. Mild intra and extrahepatic biliary ductal dilatation. Periampullary duodenal diverticulum. No CT findings of pancreatitis. Equivocal wall thickening about the greater curvature of the stomach, can be seen with gastritis. Advanced aortic and branch atherosclerosis. Renal vascular calcifications versus possible nonobstructing stones in the left kidney. Occluded left superficial femoral artery stent is likely chronic. 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 Findings favor bronchiolitis, recommend correlation for aspiration risk factors  Micro Data:  COVID-19/Influenza PCR 12/27>>negative  MRSA PCR 12/28>>positive  Blood x2 12/28>> Urine 12/28>>  Antimicrobials:  Zosyn 12/27>> Vancomycin 12/28>>12/29  Interim History / Subjective:  Pt has been weaned off Levophed and Vasopressin, currently maintaining MAP >65 On 2L [place] Tolerating diet Denies abdominal pain, chest pain, N/V/D Did have episode of A-fib @ approx. 14:00 with associated chest pain which resolved with 5 mg IV metoprolol x1  Objective   Blood pressure 100/71, pulse (!) 142, temperature 98.3 F (36.8 C), temperature source Temporal, resp. rate (!) 21, height 6' (1.829 m), weight 72.6 kg, SpO2 92 %.        Intake/Output Summary (Last 24 hours) at 10/20/2020 1151 Last data filed at 10/20/2020 1100 Gross per 24 hour  Intake 1395.47 ml  Output 125 ml  Net 1270.47 ml   Filed Weights   10/17/20 1828 10/19/20 1110  Weight: 72.6 kg 72.6 kg    Examination: General: Acutely ill-appearing male, sitting in bed, on 2 L nasal cannula, no acute distress HENT:  Atraumatic, normocephalic, neck supple, no JVD Lungs: Clear to auscultation to the left, mild expiratory wheezing to the right, even, nonlabored Cardiovascular: Fluctuates between regular rate and rhythm and irregular irregular rhythm, rate controlled, no murmurs, rubs, gallops Abdomen: Soft, nontender, nondistended, no guarding or rebound tenderness, bowel sounds positive x4 Extremities: Left BKA, normal bulk and tone, no edema Neuro: Awake and alert, follows commands, no focal deficits, speech clear, pupils PERRLA Skin: Warm and dry.  No obvious rashes, lesions, ulcerations  Assessment & Plan:   Acute hypoxic respiratory failure secondary to mild central vascular congestion  COPD without acute exacerbation Hx: tobacco abuse  -Prn supplemental O2 for dyspnea and/or hypoxia  -Scheduled and prn bronchodilator therapy  -Smoking cessation counseling provided    Septic shock secondary to choledocholithiasis with 6 x 5 mm stone in the distal common bile duct  Mildly elevated troponin likely secondary to demand ischemia in the setting of sepsis Paroxysmal atrial fibrillation   Hx: Ischemic Cardiomyopathy (Echo 04/2020 EF 25-30% bioprosthetic aortic valve worked appropriately), Aortic Stenosis, PAD, and CAD s/p CABG 1994, Aortic Stenosis  -Continuous telemetry monitoring  -Trend troponin>> peaked at 43 -Levophed and vasopressin gtt to maintain map >65>> currently in process of weaning off, adding PO Midodrine -Hold outpatient antihypertensives  -Gentile fluid resuscitation  -Echo pending>> EF 50 to [id number], normal diastolic parameters, RV function normal -Okay to reinitiate anticoagulation in 24  to 48 hours after the procedure>>will restart Heparin gtt for now   Acute on chronic renal failure with compensated metabolic acidosis (stage III CKD) Hypokalemia  Lactic acidosis-improving  -Monitor I&O's / urinary output -Follow BMP -Ensure adequate renal perfusion -Avoid nephrotoxic agents as  able -Replace electrolytes as indicated -Start gentle fluids:  NS @ 75 ml/hr    Sepsis secondary to choledocholithiasis with 6 x 5 mm stone in the distal common bile duct >>improved -Trend WBC and monitor fever curve  -Continue Zosyn -Trend PCT  -Follow cultures  -GI and general surgery consulted appreciate input  -Underwent ERCP on 10/19/20 (GI recommends repeat ERCP in 2 months to remove stent)   Anemia without obvious acute blood loss -Monitor for S/Sx of bleeding -Trend CBC -Start Heparin gtt for Anticoagulation/VTE Prophylaxis (GI recommends resuming Eliquis 1-2 days post ERCP which was done on 12/29)  -Transfuse for Hgb <7   Type II diabetes mellitus  -CBG's  -SSI  -Follow ICU Hypo/hyperglycemia protocol   Acute pain>>resolved -Prn morphine for pain management  Depression  -Restarted home antidepressants  Best practice (evaluated daily)  Diet: Heart healthy/carb modified Pain/Anxiety/Delirium protocol (if indicated): prn morphine for pain management  VAP protocol (if indicated): not indicated  DVT prophylaxis: Will start heparin gtt post procedures  GI prophylaxis: iv protonix  Glucose control: SSI  Mobility: Bedrest for now Disposition: Stepdown  Goals of Care:   Code Status: Full Code   Labs   CBC: Recent Labs  Lab 10/17/20 1846 10/18/20 0203 10/18/20 0453 10/19/20 0633 10/20/20 0424  WBC 13.2* 19.8* 17.2* 12.3* 5.7  NEUTROABS 11.6*  --   --  10.4* 5.0  HGB 11.9* 11.3* 11.7* 10.1* 9.4*  HCT 36.4* 34.8* 35.0* 30.4* 28.9*  MCV 87.3 85.9 86.2 85.6 87.0  PLT 227 212 214 184 [id number]    Basic Metabolic Panel: Recent Labs  Lab 10/18/20 0203 10/18/20 0453 10/18/20 0958 10/18/20 2017 10/19/20 0633 10/20/20 0424  NA 142 143 142  --  139 138  K 3.1* 3.4* 3.1*  --  3.5 3.8  CL 107 109 109  --  108 109  CO2 22 20* 20*  --  20* 19*  GLUCOSE 178* 120* 176*  --  138* 241*  BUN 29* 27* 28*  --  27* 34*  CREATININE 1.71* 1.73* 1.77*  --  1.70* 1.95*   CALCIUM 7.2* 7.2* 6.8*  --  7.1* 7.1*  MG 0.8*  --   --  3.0* 2.5*  --   PHOS 3.0  --   --   --   --   --    GFR: Estimated Creatinine Clearance: 33.1 mL/min (A) (by C-G formula based on SCr of 1.95 mg/dL (H)). Recent Labs  Lab 10/18/20 0203 10/18/20 0453 10/18/20 0744 10/19/20 0633 10/20/20 0424 10/20/20 0954  PROCALCITON  --  20.65  --  7.71 3.61  --   WBC 19.8* 17.2*  --  12.3* 5.7  --   LATICACIDVEN 3.1*  --  2.3*  --   --  2.4*    Liver Function Tests: Recent Labs  Lab 10/17/20 1846 10/19/20 0633 10/20/20 0424  AST 839* 135* 70*  ALT 556* 275* 206*  ALKPHOS 481* 288* 276*  BILITOT 1.8* 3.6* 2.0*  PROT 6.7 5.3* 5.3*  ALBUMIN 3.5 2.5* 2.4*   Recent Labs  Lab 10/17/20 1846 10/18/20 0212  LIPASE 79* 1,097*   No results for input(s): AMMONIA in the last 168 hours.  ABG    Component  Value Date/Time   PHART 7.38 10/18/2020 0600   PCO2ART 31 (L) 10/18/2020 0600   PO2ART 93 10/18/2020 0600   HCO3 18.3 (L) 10/18/2020 0600   ACIDBASEDEF 5.8 (H) 10/18/2020 0600   O2SAT 97.1 10/18/2020 0600     Coagulation Profile: Recent Labs  Lab 10/18/20 0212 10/19/20 1459  INR 1.5* 1.2    Cardiac Enzymes: No results for input(s): CKTOTAL, CKMB, CKMBINDEX, TROPONINI in the last 168 hours.  HbA1C: Hgb A1c MFr Bld  Date/Time Value Ref Range Status  10/18/2020 09:58 AM 6.3 (H) 4.8 - 5.6 % Final    Comment:    (NOTE) Pre diabetes:          5.7%-6.4%  Diabetes:              >6.4%  Glycemic control for   <7.0% adults with diabetes     CBG: Recent Labs  Lab 10/19/20 1918 10/19/20 2323 10/20/20 0344 10/20/20 0715 10/20/20 1108  GLUCAP 199* 257* 211* 203* 328*    Review of Systems: Positives in BOLD: Pt currently denies all complaints  Gen: Denies fever, chills, weight change, fatigue, night sweats HEENT: Denies blurred vision, double vision, hearing loss, tinnitus, sinus congestion, rhinorrhea, sore throat, neck stiffness, dysphagia PULM: Denies  shortness of breath, cough, sputum production, hemoptysis, wheezing CV: chest pain, edema, orthopnea, paroxysmal nocturnal dyspnea, palpitations GI: abdominal pain, nausea, vomiting, diarrhea, hematochezia, melena, constipation, change in bowel habits GU: Denies dysuria, hematuria, polyuria, oliguria, urethral discharge Endocrine: Denies hot or cold intolerance, polyuria, polyphagia or appetite change Derm: Denies rash, dry skin, scaling or peeling skin change Heme: Denies easy bruising, bleeding, bleeding gums Neuro: Denies headache, numbness, weakness, slurred speech, loss of memory or consciousness  Past Medical History:  He,  has a past medical history of Aortic stenosis, CAD (coronary artery disease), CKD (chronic kidney disease), stage III ([place]), COPD (chronic obstructive pulmonary disease) ([place]), Depression, Essential hypertension, GERD (gastroesophageal reflux disease), Hyperlipidemia LDL goal <70, Ischemic cardiomyopathy, PAD (peripheral artery disease) ([place]), PAF (paroxysmal atrial fibrillation) ([place]), Thyrotoxicosis, Tobacco abuse, and Type II diabetes mellitus ([place]).   Surgical History:   Past Surgical History:  Procedure Laterality Date  . CARDIAC CATHETERIZATION    . CHOLECYSTECTOMY    . CORONARY ARTERY BYPASS GRAFT     a. [place] CHOLANGIOPANCREATOGRAPHY (ERCP) WITH PROPOFOL N/A 10/19/2020   Procedure: ENDOSCOPIC RETROGRADE CHOLANGIOPANCREATOGRAPHY (ERCP) WITH PROPOFOL;  Surgeon: [name], MD;  Location: [facility] ENDOSCOPY;  Service: Endoscopy;  Laterality: N/A;  . Left AKA    . RIGHT/LEFT HEART CATH AND CORONARY ANGIOGRAPHY N/A 06/06/2020   Procedure: RIGHT/LEFT HEART CATH AND CORONARY ANGIOGRAPHY;  Surgeon: Wellington Hampshire, MD;  Location: [place] [facility];  Service: Cardiovascular;  Laterality: N/A;     Social History:   reports that he has been smoking. He has a 62.00 pack-year smoking history. He has never used smokeless tobacco.  He reports that he does not drink alcohol and does not use drugs.   Family History:  His family history includes CAD in his sister; Heart attack in his mother; Heart disease in his brother; Other in his father, sister, and sister.   Allergies No Known Allergies   Home Medications  Prior to Admission medications   Medication Sig Start Date End Date Taking? Authorizing Provider  acetaminophen (TYLENOL) 500 MG tablet Take 500 mg by mouth every 6 (six) hours as needed.    [provider]  albuterol (PROVENTIL) (2.5 MG/3ML) 0.083%  nebulizer solution Take 2.5 mg by nebulization every 4 (four) hours as needed for wheezing or shortness of breath.    [provider]  albuterol (VENTOLIN HFA) 108 (90 Base) MCG/ACT inhaler Inhale 2 puffs into the lungs every 6 (six) hours as needed for wheezing or shortness of breath.     [provider]  aspirin EC 81 MG tablet Take 81 mg by mouth daily.    [provider]  atorvastatin (LIPITOR) 80 MG tablet Take 80 mg by mouth at bedtime.    [provider]  carvedilol (COREG) 3.125 MG tablet Take 1 tablet (3.125 mg total) by mouth 2 (two) times daily. 06/17/20 09/15/20  Loel Dubonnet, NP  DULoxetine (CYMBALTA) 30 MG capsule Take 30 mg by mouth 2 (two) times daily.    [provider]  ferrous sulfate 325 (65 FE) MG tablet Take 325 mg by mouth daily.    [provider]  Fluticasone-Salmeterol (ADVAIR) 250-50 MCG/DOSE AEPB Inhale 1 puff into the lungs 2 (two) times daily.    [provider]  furosemide (LASIX) 80 MG tablet Take 1 tablet (80 mg total) once daily in the morning 06/20/20   Loel Dubonnet, NP  hydrOXYzine (ATARAX/VISTARIL) 25 MG tablet Take 25 mg by mouth every 6 (six) hours as needed (allergy symptoms).    [provider]  isosorbide mononitrate (IMDUR) 30 MG 24 hr tablet Take 1 tablet (30 mg total) by mouth daily. 03/11/20 05/27/29  Sharen Hones, MD  ketotifen (ZADITOR)  0.025 % ophthalmic solution Place 1 drop into both eyes 2 (two) times daily as needed.     [provider]  lactulose (CHRONULAC) 10 GM/15ML solution Take 30 g by mouth every 12 (twelve) hours as needed for mild constipation.    [provider]  lamoTRIgine (LAMICTAL) 25 MG tablet Take 25 mg by mouth 2 (two) times daily.    [provider]  lidocaine (LIDODERM) 5 % Cut 1 patch in half and apply to amputated stump once daily 12 hours on, 12 hours off    [provider]  liver oil-zinc oxide (DESITIN) 40 % ointment Apply 1 application topically at bedtime as needed for irritation. To buttocks     [provider]  loperamide (IMODIUM) 2 MG capsule Take by mouth. 03/31/20   [provider]  LORazepam (ATIVAN) 0.5 MG tablet Take 0.5 mg by mouth 2 (two) times daily as needed for anxiety.    [provider]  Melatonin 3 MG TABS Take 1 tablet by mouth at bedtime.    [provider]  metFORMIN (GLUCOPHAGE) 1000 MG tablet Take by mouth. 08/04/20   [provider]  metFORMIN (GLUCOPHAGE) 500 MG tablet Take 500 mg by mouth 2 (two) times daily with a meal.     [provider]  montelukast (SINGULAIR) 10 MG tablet Take 10 mg by mouth at bedtime.    [provider]  Multiple Vitamins-Minerals (PRESERVISION AREDS 2) CAPS Take 1 capsule by mouth 2 (two) times daily.    [provider]  nicotine (NICODERM CQ - DOSED IN MG/24 HOURS) 14 mg/24hr patch Place 14 mg onto the skin daily.    [provider]  nitroGLYCERIN (NITROSTAT) 0.4 MG SL tablet Place 0.4 mg under the tongue every 5 (five) minutes as needed for chest pain.    [provider]  pantoprazole (PROTONIX) 40 MG tablet Take 40 mg by mouth daily.    [provider]  potassium chloride  SA (KLOR-CON) 20 MEQ tablet Take 1 tablet (20 mEq total) by mouth daily. 06/20/20 09/18/20  Loel Dubonnet, NP  Rivaroxaban (XARELTO) 15 MG TABS  tablet Take 15 mg by mouth daily.    [provider]  sacubitril-valsartan (ENTRESTO) 24-26 MG Take 1 tablet by mouth 2 (two) times daily. 05/27/20   Loel Dubonnet, NP  senna (SENOKOT) 8.6 MG tablet Take 2 tablets by mouth at bedtime.    [provider]  tamsulosin (FLOMAX) 0.4 MG CAPS capsule Take 0.4 mg by mouth daily.    [provider]  traZODone (DESYREL) 50 MG tablet Take 25 mg by mouth at bedtime as needed for sleep.    [provider]  triamcinolone cream (KENALOG) 0.1 % Apply 1 application topically 2 (two) times daily.    [provider]     Critical care time: 37 minutes    Darel Hong, [facility] [place] [facility] Pager: [phone]

## 2020-10-21 ENCOUNTER — Inpatient Hospital Stay: Payer: Medicare Other

## 2020-10-21 DIAGNOSIS — A419 Sepsis, unspecified organism: Secondary | ICD-10-CM | POA: Diagnosis not present

## 2020-10-21 DIAGNOSIS — K8309 Other cholangitis: Secondary | ICD-10-CM | POA: Diagnosis not present

## 2020-10-21 LAB — GLUCOSE, CAPILLARY
Glucose-Capillary: 249 mg/dL — ABNORMAL HIGH (ref 70–99)
Glucose-Capillary: 180 mg/dL — ABNORMAL HIGH (ref 70–99)
Glucose-Capillary: 271 mg/dL — ABNORMAL HIGH (ref 70–99)
Glucose-Capillary: 332 mg/dL — ABNORMAL HIGH (ref 70–99)
Glucose-Capillary: 322 mg/dL — ABNORMAL HIGH (ref 70–99)

## 2020-10-21 LAB — COMPREHENSIVE METABOLIC PANEL
ALT: 158 U/L — ABNORMAL HIGH (ref 0–44)
AST: 38 U/L (ref 15–41)
Albumin: 2.5 g/dL — ABNORMAL LOW (ref 3.5–5.0)
Alkaline Phosphatase: 241 U/L — ABNORMAL HIGH (ref 38–126)
Anion gap: 10 (ref 5–15)
BUN: 30 mg/dL — ABNORMAL HIGH (ref 8–23)
CO2: 21 mmol/L — ABNORMAL LOW (ref 22–32)
Calcium: 7.4 mg/dL — ABNORMAL LOW (ref 8.9–10.3)
Chloride: 107 mmol/L (ref 98–111)
Creatinine, Ser: 1.77 mg/dL — ABNORMAL HIGH (ref 0.61–1.24)
GFR, Estimated: 39 mL/min — ABNORMAL LOW (ref >60)
Glucose, Bld: 227 mg/dL — ABNORMAL HIGH (ref 70–99)
Potassium: 3.2 mmol/L — ABNORMAL LOW (ref 3.5–5.1)
Sodium: 138 mmol/L (ref 135–145)
Total Bilirubin: 1.2 mg/dL (ref 0.3–1.2)
Total Protein: 5.4 g/dL — ABNORMAL LOW (ref 6.5–8.1)

## 2020-10-21 LAB — CBC WITH DIFFERENTIAL/PLATELET
Abs Immature Granulocytes: 0.04 10*3/uL (ref 0.00–0.07)
Basophils Absolute: 0 10*3/uL (ref 0.0–0.1)
Basophils Relative: 0 %
Eosinophils Absolute: 0.1 10*3/uL (ref 0.0–0.5)
Eosinophils Relative: 1 %
HCT: 27.8 % — ABNORMAL LOW (ref 39.0–52.0)
Hemoglobin: 9.3 g/dL — ABNORMAL LOW (ref 13.0–17.0)
Immature Granulocytes: 0 %
Lymphocytes Relative: 10 %
Lymphs Abs: 1.1 10*3/uL (ref 0.7–4.0)
MCH: 28.2 pg (ref 26.0–34.0)
MCHC: 33.5 g/dL (ref 30.0–36.0)
MCV: 84.2 fL (ref 80.0–100.0)
Monocytes Absolute: 0.5 10*3/uL (ref 0.1–1.0)
Monocytes Relative: 5 %
Neutro Abs: 8.6 10*3/uL — ABNORMAL HIGH (ref 1.7–7.7)
Neutrophils Relative %: 84 %
Platelets: 170 10*3/uL (ref 150–400)
RBC: 3.3 MIL/uL — ABNORMAL LOW (ref 4.22–5.81)
RDW: 14.5 % (ref 11.5–15.5)
WBC: 10.3 10*3/uL (ref 4.0–10.5)
nRBC: 0 % (ref 0.0–0.2)

## 2020-10-21 LAB — BLOOD GAS, ARTERIAL
Acid-base deficit: 5.5 mmol/L — ABNORMAL HIGH (ref 0.0–2.0)
Bicarbonate: 18.9 mmol/L — ABNORMAL LOW (ref 20.0–28.0)
FIO2: 0.36
O2 Saturation: 89.1 %
Patient temperature: 37
pCO2 arterial: 32 mmHg (ref 32.0–48.0)
pH, Arterial: 7.38 (ref 7.350–7.450)
pO2, Arterial: 58 mmHg — ABNORMAL LOW (ref 83.0–108.0)

## 2020-10-21 LAB — HEPARIN LEVEL (UNFRACTIONATED): Heparin Unfractionated: 0.49 IU/mL (ref 0.30–0.70)

## 2020-10-21 LAB — LIPASE, BLOOD: Lipase: 504 U/L — ABNORMAL HIGH (ref 11–51)

## 2020-10-21 MED ORDER — POTASSIUM CHLORIDE CRYS ER 20 MEQ PO TBCR
40.0000 meq | EXTENDED_RELEASE_TABLET | Freq: Once | ORAL | Status: AC
  Administered 2020-10-21: 40 meq via ORAL
  Filled 2020-10-21: qty 2

## 2020-10-21 MED ORDER — CARVEDILOL 3.125 MG PO TABS
3.1250 mg | ORAL_TABLET | Freq: Two times a day (BID) | ORAL | Status: DC
  Administered 2020-10-21 – 2020-10-25 (×8): 3.125 mg via ORAL
  Filled 2020-10-21 (×8): qty 1

## 2020-10-21 MED ORDER — RIVAROXABAN 15 MG PO TABS
15.0000 mg | ORAL_TABLET | Freq: Every day | ORAL | Status: DC
  Administered 2020-10-21 – 2020-10-25 (×5): 15 mg via ORAL
  Filled 2020-10-21 (×5): qty 1

## 2020-10-21 MED ORDER — INSULIN DETEMIR 100 UNIT/ML ~~LOC~~ SOLN
20.0000 [IU] | Freq: Every day | SUBCUTANEOUS | Status: DC
  Administered 2020-10-21 – 2020-10-24 (×4): 20 [IU] via SUBCUTANEOUS
  Filled 2020-10-21 (×6): qty 0.2

## 2020-10-21 MED ORDER — LINEZOLID 600 MG PO TABS
600.0000 mg | ORAL_TABLET | Freq: Two times a day (BID) | ORAL | Status: DC
  Administered 2020-10-21 (×2): 600 mg via ORAL
  Filled 2020-10-21 (×4): qty 1

## 2020-10-21 MED ORDER — METHYLPREDNISOLONE SODIUM SUCC 125 MG IJ SOLR
INTRAMUSCULAR | Status: AC
  Administered 2020-10-21: 125 mg
  Filled 2020-10-21: qty 2

## 2020-10-21 MED ORDER — METHYLPREDNISOLONE SODIUM SUCC 40 MG IJ SOLR: 40.0000 mg | INTRAMUSCULAR | Status: AC

## 2020-10-21 MED ORDER — FUROSEMIDE 10 MG/ML IJ SOLN
40.0000 mg | INTRAMUSCULAR | Status: AC
  Administered 2020-10-21: 40 mg via INTRAVENOUS
  Filled 2020-10-21: qty 4

## 2020-10-21 MED ORDER — POTASSIUM CHLORIDE 10 MEQ/50ML IV SOLN
10.0000 meq | INTRAVENOUS | Status: AC
  Administered 2020-10-21 (×5): 10 meq via INTRAVENOUS
  Filled 2020-10-21 (×6): qty 50

## 2020-10-21 NOTE — Progress Notes (Signed)
PT Cancellation Note  Patient Details Name: Shawn Jennings MRN: [id number] DOB: 12-Jul-1946   Cancelled Treatment:    Reason Eval/Treat Not Completed: Other (comment) PT orders received, chart reviewed. Pt with new IV amiodarone drip (10/20/20 at 2330). Per therapy protocols pt needs to be on IV drip 24 hours before initiation of therapy. Will hold PT evaluation today & f/u tomorrow.  Aleda Grana, PT, DPT 10/21/20, 10:38 AM    Sandi Mariscal 10/21/2020, 10:37 AM

## 2020-10-21 NOTE — Progress Notes (Signed)
PROGRESS NOTE    Shawn Jennings  [id number] DOB: Aug 30, 1946 DOA: 10/17/2020 PCP: Orvis Brill, [facility]   Chief Complain:  Brief Narrative: 74 year old male with history of paroxysmal A. fib who was initially admitted under [facility] service had to be moved to ICU after he developed septic shock from ascending colitis.  Underwent ERCP with a stent placement.  Currently blood pressure stable without pressors.  Stable for transfer to our service on 10/21/2020.  Hospital course also remarkable for A. fib with RVR, AKI on CKD stage III.  Assessment & Plan:   Active Problems:   Choledocholithiasis   Protein-calorie malnutrition, severe   Cholangitis  Acute on chronic hypoxic respiratory failure: Currently respiratory status stable.  Most likely this is from COPD exacerbation.  He still smokes.  Continue to wean the oxygen.  He is not on oxygen at home.  Continue bronchodilators.  Septic shock secondary to ascending cholangitis: Needed vasopressor support after hospitalization and had to be transferred to ICU.  Currently blood pressure stable without vasopressors.  Cultures negative so far.  Continue Zosyn and zyvox.  GI recommended to follow-up as an outpatient for ERCP in 2 months to remove the stent.  Blood pressure still soft so started on midodrine. We will consider changing antibiotics to oral soon.  Ascending colitis: Found to have 6X 5 mm stone in the distal common bile duct.  GI was consulted and he underwent ERCP with stone extraction.  Continue Zosyn.  Follow-up cultures.  Will change antibiotics to oral when appropriate.  Elevated troponin: Most likely secondary to supply demand ischemia.  Denies any chest pain.  Ischemic cardiomyopathy: Currently euvolumic.  Echo on 04/2020 showed ejection fraction of 25 to 30%, bioprosthetic aortic valve, aortic stenosis.  He has history of CABG in 1994.  Continue home medications.  He is on aspirin, Lipitor, carvedilol, Imdur, Entresto  A. fib  with RVR: History of paroxysmal A. fib.  Currently in sinus rhythm.  On went into RVR during his hospitalization.  On xarelto.Given a dose of IV amiodarone.  Currently heart rate is stable .  Continue Coreg that he takes at home.  Monitor on telemetry. Echo during this hospitalization showed ejection fraction of [id number], normal diastolic parameters.  AKI on CKD stage IIIa: Currently kidney function at baseline.  He was given some IV fluids which has been stopped now.  Baseline creatinine in the range of 1.4-1.6  Normocytic anemia: Monitor hemoglobin which is currently stable.  Transfuse if less than 7.  Diabetes mellitus type 2: Monitor CBGs.  Continue sliding scale insulin and Lantus.  Takes Metformin at home  Depression: Continue home antidepressant  Hypokalemia: Supplemented with potassium.  Hyperlipidemia: On Lipitor  Tobacco abuse: Counseled for cessation.  Continue nicotine patch  Debility/deconditioning: Status post left AKA.  Nursing facility resident.  Will request PT/OT evaluation      Nutrition Problem: Severe Malnutrition Etiology: chronic illness (COPD)      DVT prophylaxis:Xarelto Code Status: Full Family Communication: None at bedside Status is: Inpatient  Not inpatient appropriate, will call UM team and downgrade to OBS.   Dispo: The patient is from: nursing facility              Anticipated d/c is to: SNF              Anticipated d/c date is: 3 days              Patient currently is not medically stable to d/c.     Consultants:  PCCM  Procedures:ERCP  Antimicrobials:  Anti-infectives (From admission, onward)   Start     Dose/Rate Route Frequency Ordered Stop   10/21/20 1000  linezolid (ZYVOX) tablet 600 mg        600 mg Oral Every 12 hours 10/21/20 0849     10/19/20 1000  vancomycin (VANCOCIN) IVPB 1000 mg/200 mL premix  Status:  Discontinued       "Followed by" Linked Group Details   1,000 mg 200 mL/hr over 60 Minutes Intravenous Every 24 hours  10/18/20 0335 10/19/20 1132   10/18/20 0500  piperacillin-tazobactam (ZOSYN) IVPB 3.375 g        3.375 g 12.5 mL/hr over 240 Minutes Intravenous Every 8 hours 10/18/20 0338     10/18/20 0430  vancomycin (VANCOREADY) IVPB 1500 mg/300 mL       "Followed by" Linked Group Details   1,500 mg 150 mL/hr over 120 Minutes Intravenous  Once 10/18/20 0335 10/18/20 0536   10/18/20 0415  metroNIDAZOLE (FLAGYL) IVPB 500 mg  Status:  Discontinued        500 mg 100 mL/hr over 60 Minutes Intravenous Every 8 hours 10/18/20 0326 10/18/20 0332   10/18/20 0415  vancomycin (VANCOCIN) IVPB 1000 mg/200 mL premix  Status:  Discontinued        1,000 mg 200 mL/hr over 60 Minutes Intravenous  Once 10/18/20 0326 10/18/20 0331   10/17/20 2100  piperacillin-tazobactam (ZOSYN) IVPB 3.375 g        3.375 g 100 mL/hr over 30 Minutes Intravenous  Once 10/17/20 2056 10/17/20 2229      Subjective:  Patient seen and examined at the bedside this morning.  Hemodynamically stable during my evaluation.  Not in any kind of distress.  Still requiring some oxygen.  Objective: Vitals:   10/21/20 0600 10/21/20 0628 10/21/20 0700 10/21/20 0800  BP: 117/73  113/69 117/66  Pulse: (!) 54 (!) 53 (!) 52 (!) 55  Resp: (!) 29 (!) 28 (!) 22 20  Temp:      TempSrc:      SpO2: 90% (!) 89% 90% 91%  Weight:      Height:        Intake/Output Summary (Last 24 hours) at 10/21/2020 1423 Last data filed at 10/21/2020 1007 Gross per 24 hour  Intake 2435.95 ml  Output 2250 ml  Net 185.95 ml   Filed Weights   10/17/20 1828 10/19/20 1110  Weight: 72.6 kg 72.6 kg    Examination:  General exam: Chronically ill looking, deconditioned, debilitated Respiratory system: Bilateral mildly diminished air sounds, no wheezes or crackles  cardiovascular system: Normal sinus rhythm. No JVD, murmurs, rubs, gallops or clicks. No pedal edema. Gastrointestinal system: Abdomen is nondistended, soft and nontender. No organomegaly or masses felt.  Normal bowel sounds heard. Central nervous system: Alert and oriented. No focal neurological deficits. Extremities: No edema, no clubbing ,no cyanosis, left AKA  skin: No rashes, lesions or ulcers,no icterus ,no pallor   Data Reviewed: I have personally reviewed following labs and imaging studies  CBC: Recent Labs  Lab 10/17/20 1846 10/18/20 0203 10/18/20 0453 10/19/20 0633 10/20/20 0424 10/21/20 0429  WBC 13.2* 19.8* 17.2* 12.3* 5.7 10.3  NEUTROABS 11.6*  --   --  10.4* 5.0 8.6*  HGB 11.9* 11.3* 11.7* 10.1* 9.4* 9.3*  HCT 36.4* 34.8* 35.0* 30.4* 28.9* 27.8*  MCV 87.3 85.9 86.2 85.6 87.0 84.2  PLT 227 212 214 184 163 170   Basic Metabolic Panel: Recent Labs  Lab 10/18/20 0203  10/18/20 0453 10/18/20 0958 10/18/20 2017 10/19/20 0633 10/20/20 0424 10/21/20 0429  NA 142 143 142  --  139 138 138  K 3.1* 3.4* 3.1*  --  3.5 3.8 3.2*  CL 107 109 109  --  108 109 107  CO2 22 20* 20*  --  20* 19* 21*  GLUCOSE 178* 120* 176*  --  138* 241* 227*  BUN 29* 27* 28*  --  27* 34* 30*  CREATININE 1.71* 1.73* 1.77*  --  1.70* 1.95* 1.77*  CALCIUM 7.2* 7.2* 6.8*  --  7.1* 7.1* 7.4*  MG 0.8*  --   --  3.0* 2.5*  --   --   PHOS 3.0  --   --   --   --   --   --    GFR: Estimated Creatinine Clearance: 36.5 mL/min (A) (by C-G formula based on SCr of 1.77 mg/dL (H)). Liver Function Tests: Recent Labs  Lab 10/17/20 1846 10/19/20 0633 10/20/20 0424 10/21/20 0429  AST 839* 135* 70* 38  ALT 556* 275* 206* 158*  ALKPHOS 481* 288* 276* 241*  BILITOT 1.8* 3.6* 2.0* 1.2  PROT 6.7 5.3* 5.3* 5.4*  ALBUMIN 3.5 2.5* 2.4* 2.5*   Recent Labs  Lab 10/17/20 1846 10/18/20 0212 10/20/20 2225  LIPASE 79* 1,097* 504*   No results for input(s): AMMONIA in the last 168 hours. Coagulation Profile: Recent Labs  Lab 10/18/20 0212 10/19/20 1459  INR 1.5* 1.2   Cardiac Enzymes: No results for input(s): CKTOTAL, CKMB, CKMBINDEX, TROPONINI in the last 168 hours. BNP (last 3 results) No  results for input(s): PROBNP in the last 8760 hours. HbA1C: No results for input(s): HGBA1C in the last 72 hours. CBG: Recent Labs  Lab 10/20/20 1936 10/20/20 2321 10/21/20 0323 10/21/20 0746 10/21/20 1206  GLUCAP 335* 345* 249* 180* 271*   Lipid Profile: No results for input(s): CHOL, HDL, LDLCALC, TRIG, CHOLHDL, LDLDIRECT in the last 72 hours. Thyroid Function Tests: No results for input(s): TSH, T4TOTAL, FREET4, T3FREE, THYROIDAB in the last 72 hours. Anemia Panel: No results for input(s): VITAMINB12, FOLATE, FERRITIN, TIBC, IRON, RETICCTPCT in the last 72 hours. Sepsis Labs: Recent Labs  Lab 10/18/20 0203 10/18/20 0453 10/18/20 0744 10/19/20 0633 10/20/20 0424 10/20/20 0954  PROCALCITON  --  20.65  --  7.71 3.61  --   LATICACIDVEN 3.1*  --  2.3*  --   --  2.4*    Recent Results (from the past 240 hour(s))  Resp Panel by RT-PCR (Flu A&B, Covid) Nasopharyngeal Swab     Status: None   Collection Time: 10/17/20  9:19 PM   Specimen: Nasopharyngeal Swab; Nasopharyngeal(NP) swabs in vial transport medium  Result Value Ref Range Status   SARS Coronavirus 2 by RT PCR NEGATIVE NEGATIVE Final    Comment: (NOTE) SARS-CoV-2 target nucleic acids are NOT DETECTED.  The SARS-CoV-2 RNA is generally detectable in upper respiratory specimens during the acute phase of infection. The lowest concentration of SARS-CoV-2 viral copies this assay can detect is 138 copies/mL. A negative result does not preclude SARS-Cov-2 infection and should not be used as the sole basis for treatment or other patient management decisions. A negative result may occur with  improper specimen collection/handling, submission of specimen other than nasopharyngeal swab, presence of viral mutation(s) within the areas targeted by this assay, and inadequate number of viral copies(<138 copies/mL). A negative result must be combined with clinical observations, patient history, and epidemiological information. The  expected result is Negative.  Fact Sheet for Patients:  BloggerCourse.com  Fact Sheet for Healthcare Providers:  SeriousBroker.it  This test is no t yet approved or cleared by the [place] FDA and  has been authorized for detection and/or diagnosis of SARS-CoV-2 by FDA under an Emergency Use Authorization (EUA). This EUA will remain  in effect (meaning this test can be used) for the duration of the COVID-19 declaration under Section 564(b)(1) of the Act, 21 U.S.C.section 360bbb-3(b)(1), unless the authorization is terminated  or revoked sooner.       Influenza A by PCR NEGATIVE NEGATIVE Final   Influenza B by PCR NEGATIVE NEGATIVE Final    Comment: (NOTE) The Xpert Xpress SARS-CoV-2/FLU/RSV plus assay is intended as an aid in the diagnosis of influenza from Nasopharyngeal swab specimens and should not be used as a sole basis for treatment. Nasal washings and aspirates are unacceptable for Xpert Xpress SARS-CoV-2/FLU/RSV testing.  Fact Sheet for Patients: BloggerCourse.com  Fact Sheet for Healthcare Providers: SeriousBroker.it  This test is not yet approved or cleared by the [place] FDA and has been authorized for detection and/or diagnosis of SARS-CoV-2 by FDA under an Emergency Use Authorization (EUA). This EUA will remain in effect (meaning this test can be used) for the duration of the COVID-19 declaration under Section 564(b)(1) of the Act, 21 U.S.C. section 360bbb-3(b)(1), unless the authorization is terminated or revoked.  Performed at [facility], [address] [place], [place] [address]   MRSA PCR Screening     Status: Abnormal   Collection Time: 10/18/20 12:17 AM   Specimen: Nasal Mucosa; Nasopharyngeal  Result Value Ref Range Status   MRSA by PCR POSITIVE (A) NEGATIVE Final    Comment:        The GeneXpert MRSA Assay (FDA approved for  NASAL specimens only), is one component of a comprehensive MRSA colonization surveillance program. It is not intended to diagnose MRSA infection nor to guide or monitor treatment for MRSA infections. RESULT CALLED TO, READ BACK BY AND VERIFIED WITH: MARSHA HATCH @0204  ON 10/18/20 SKL Performed at [facility], [address] [place], [place]   Culture, blood (routine x 2)     Status: None (Preliminary result)   Collection Time: 10/18/20  4:41 AM   Specimen: BLOOD  Result Value Ref Range Status   Specimen Description BLOOD LEFT WRIST  Final   Special Requests   Final    BOTTLES DRAWN AEROBIC AND ANAEROBIC Blood Culture adequate volume   Culture   Final    NO GROWTH 3 DAYS Performed at [facility], [address] [place], [place]    Report Status PENDING  Incomplete  Culture, blood (routine x 2)     Status: None (Preliminary result)   Collection Time: 10/18/20  4:53 AM   Specimen: BLOOD  Result Value Ref Range Status   Specimen Description BLOOD LEFT ASSIST CONTROL  Final   Special Requests   Final    BOTTLES DRAWN AEROBIC AND ANAEROBIC Blood Culture adequate volume   Culture   Final    NO GROWTH 3 DAYS Performed at [facility], [address], [place], [place]    Report Status PENDING  Incomplete  Urine Culture     Status: None   Collection Time: 10/18/20  9:02 AM   Specimen: Urine, Random  Result Value Ref Range Status   Specimen Description   Final    URINE, RANDOM Performed at [facility], [address] [place], [place] [address]    Special Requests   Final    NONE Performed at [facility], [address] [place], [place] [address]    Culture   Final    NO GROWTH Performed at [facility], [place] [address] [place], [place] [address]    Report Status 10/19/2020 FINAL  Final         Radiology Studies: DG Abd 1 View  Result Date: 10/20/2020 CLINICAL DATA:   Abdominal pain.  Patient reports constipation. EXAM: ABDOMEN - 1 VIEW COMPARISON:  CT 09/17/2020 FINDINGS: Plastic biliary stent in place. Cholecystectomy clips in the right upper quadrant. Moderate stool in the right colon. Air-filled distal transverse colon. Small volume of air and stool throughout the more distal colon. Patulous loop of small bowel in the left mid abdomen adjacent to enteric sutures, also seen on prior CT. There is no evidence of obstruction. Advanced vascular calcifications. No free intra-abdominal air on supine view. No acute osseous abnormalities are seen. IMPRESSION: 1. Nonobstructive bowel gas pattern. Moderate stool in the right colon. 2. Plastic biliary stent in place. 3. Patulous loop of small bowel in the left mid abdomen adjacent to enteric sutures, also seen on prior CT. Electronically Signed   By: Keith Rake M.D.   On: 10/20/2020 18:54   DG Chest Port 1 View  Result Date: 10/21/2020 CLINICAL DATA:  74 year old male with respiratory failure, hypoxia. Choledocholithiasis. EXAM: PORTABLE CHEST 1 VIEW COMPARISON:  Portable chest 10/18/2020 and earlier. FINDINGS: Portable AP upright view at 0612 hours. Stable right IJ central line. Prior TAVR. Stable cardiac size and mediastinal contours. Patchy and confluent new left mid lung, perihilar opacity. Stable lung volumes and elsewhere lung markings are stable. No pneumothorax. Small veiling left pleural effusion also now suspected. Nonobstructed visible bowel gas pattern. No acute osseous abnormality identified. IMPRESSION: 1. Confluent new left mid lung opacity most suspicious for Pneumonia. Aspiration also a consideration. 2. Small new left pleural effusion suspected. Electronically Signed   By: Genevie Ann M.D.   On: 10/21/2020 06:27        Scheduled Meds: . atorvastatin  80 mg Oral QHS  . Chlorhexidine Gluconate Cloth  6 each Topical Q0600  . docusate sodium  100 mg Oral BID  . DULoxetine  30 mg Oral BID  . feeding  supplement  237 mL Oral TID BM  . ferrous sulfate  325 mg Oral Daily  . insulin aspart  0-20 Units Subcutaneous TID WC  . insulin aspart  0-5 Units Subcutaneous QHS  . insulin detemir  8 Units Subcutaneous QHS  . lamoTRIgine  25 mg Oral BID  . linezolid  600 mg Oral Q12H  . melatonin  2.5 mg Oral QHS  . midodrine  5 mg Oral TID WC  . mometasone-formoterol  2 puff Inhalation BID  . montelukast  10 mg Oral QHS  . multivitamin with minerals  1 tablet Oral Daily  . multivitamin-lutein  1 capsule Oral BID  . mupirocin ointment  1 application Nasal BID  . nicotine  14 mg Transdermal Daily  . pantoprazole (PROTONIX) IV  40 mg Intravenous Q24H  . Rivaroxaban  15 mg Oral Daily  . senna  2 tablet Oral QHS  . tamsulosin  0.4 mg Oral Daily   Continuous Infusions: . sodium chloride Stopped (10/21/20 0558)  . piperacillin-tazobactam (ZOSYN)  IV 12.5 mL/hr at 10/21/20 0800     LOS: 4 days    Time spent: 35 mins.More than  50% of that time was spent in counseling and/or coordination of care.      Shelly Coss, MD [facility] P12/31/2021, 2:23 PM

## 2020-10-21 NOTE — Progress Notes (Signed)
OT Cancellation Note  Patient Details Name: Shawn Jennings MRN: [id number] DOB: 05/01/1946   Cancelled Treatment:    Reason Eval/Treat Not Completed: Medical issues which prohibited therapy.  OT orders received, chart reviewed. Pt with new IV amiodarone drip (10/20/20 at 2330). Per therapy protocols pt needs to be on IV drip 24 hours before initiation of therapy. Will hold OT evaluation today & f/u next date as available and pt medically appropriate.  Rockney Ghee, M.S., OTR/L Ascom: [phone] 10/21/20, 11:17 AM

## 2020-10-21 NOTE — Progress Notes (Signed)
Pt alert/oriented throughout shift, however this am pt alert to self only.  Pupils 2 mm reactive, follows commands, 3/5 motor strength bilateral upper extremities and right lower extremity.  Pt tachypneic, c/o shortness of breath, and diminished with faint crackles O2 sats 88-91% on 3L .  CXR revealed left infiltrate and pt 3L positive.  Ordered 40 mg iv lasix x1 dose; 40 mg iv solumedrol x1; albuterol neb; and abg pending.  Continue zosyn.  Will continue to monitor and assess pt.  Additional critical care time 30 mins.  Sonda Rumble, AGNP  Pulmonary/Critical Care Pager [phone] (please enter 7 digits) PCCM Consult Pager [phone] (please enter 7 digits)

## 2020-10-21 NOTE — Progress Notes (Signed)
CH visited pt. per OR for prayer; pt. sitting up in bed, awake, watching TV, agreeable to visit.  Pt. says he was brought to the hospital approx. 1 wk. ago for severe abdominal pain and had to have surgery; pt. had trouble remembering details of his life pre-hospitalization (where he lived, what his girlfriend's name and telephone number is) and says that he only recently (since hospitalization) began having these memory issues.  Pt. is distressed by this loss of memory and is worried that this will not improve.  He requests visit later this PM.  Ch hopes to follow up.

## 2020-10-21 NOTE — Progress Notes (Signed)
CCMD called to report 10 beats of vtach on tele/ RN to bedside to assess/ pt asymptomatic/ MD made aware/ will monitor

## 2020-10-21 NOTE — Progress Notes (Signed)
NAME:  Shawn Jennings, MRN:  [id number], DOB:  05-31-1946, LOS: 4 ADMISSION DATE:  10/17/2020, CONSULTATION DATE: 10/18/2020 REFERRING MD: Rufina Falco, NP, CHIEF COMPLAINT: Chest Pain   Brief History:  74 yo male admitted with septic shock secondary choledocholithiasis with stone in the distal common bile duct requiring levophed gtt   History of Present Illness:  This is a 74 yo male who presented to [facility] ER on 12/28 with c/o chest pain which resolved after he took nitroglycerin, onset of chest pain 2 hrs prior to ER presentation.  Upon arrival to the ER pt reported 8/10 intensity severe nonradiating epigastric pain along with nausea/vomiting onset 2 weeks prior to presentation.  He had a LHC on 05/2020 with 100% stenosed circumflex, LAD, and RCA, with intact grafts.  ER lab results revealed K+ 3.4, CO2 20, glucose 120, BUN 27, creatinine 1.73, pct 20.65, wbc 17.2, hgb 11.7, and abg pH 7.38/pCO2 31/pO2 93/bicarb 18.3. MRSA PCR positive. CXR and COVID-19/Influenza PCR negative. CT Abd Pelvis revealed choledocholithiasis with 6 x 5 mm stone in the distal common bile duct.  Pt hypotensive sbp 70's and tachycardic hr 117 meeting sepsis criteria.  He received 3.2L LR bolus along with zosyn in the ER with initial improvement of bp. He was admitted to the [facility] unit per hospitalist team with gastroenterology and general surgery consults pending.  However, on 12/28 pt developed hypotension requiring transfer to the stepdown unit and levophed gtt initiated.  PCCM team consulted to assist with management.    Interval updates:  10-19-20 no significant events overnight.  Patient able to get ERCP today.  States that afterwards he feels much better.  Patient is having some A. fib with RVR but rate sustained less than 150.  As patient is on Levophed drip will hold off on any beta-blockers or calcium channel blockers.  If sustains above 150 will initiate amiodarone.  12/30-21: Pt weaned off vasopressors.   Had episode of A-fib w/ RVR with associated chest pain which resolved with Metoprolol 5 mg IV push x1  Past Medical History:  Type II Diabetes Mellitus Tobacco Abuse Thyrotoxicosis Paroxysmal Atrial Fibrillation (on xarelto) PAD Ischemic Cardiomyopathy (Echo 04/2020 EF 25-30% bioprosthetic aortic valve worked appropriately) HLD GERD  Essential HTN Depression COPD Stage III CKD CAD s/p CABG 1994 Aortic Stenosis   Significant Hospital Events:  12/27: Pt admitted to the medsurg unit with sepsis secondary to choledocholithiasis with stone in the distal common bile duct. 12/28: Pt with worsening hypotension requiring transfer to the stepdown unit for pressors  12/30: Episode of A-fib w/ RVR 12/31: Episode of Confusion, SOB, and Hypoxic, CXR with Left infiltrate; place on zyvox, diuresed  Consults:  Intensivist  Gastroenterology  General Surgery  Hospitalist  Procedures:  Right IJ CVL 12/28>> 12/29: ERCP  Significant Diagnostic Tests:  12/27: CT Abd Pelvis revealed choledocholithiasis with 6 x 5 mm stone in the distal common bile duct. Mild intra and extrahepatic biliary ductal dilatation. Periampullary duodenal diverticulum. No CT findings of pancreatitis. Equivocal wall thickening about the greater curvature of the stomach, can be seen with gastritis. Advanced aortic and branch atherosclerosis. Renal vascular calcifications versus possible nonobstructing stones in the left kidney. Occluded left superficial femoral artery stent is likely chronic. 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 Findings favor bronchiolitis, recommend correlation for aspiration risk factors  Micro Data:  COVID-19/Influenza PCR 12/27>>negative  MRSA PCR 12/28>>positive  Blood x2 12/28>> Urine 12/28>>  Sputum 12/31>> Strep pneumo urinary antigen 12/31>> Legionella urinary antigen 12/31>>  Antimicrobials:  Zosyn  12/27>> Vancomycin 12/28>>12/29 Linezolid 12/31>>  Interim History / Subjective:  Early this morning pt with confusion, SOB, Hypoxia, and crackles upon auscultation, CXR revealed Left infiltrate Received 40 mg IV Lasix x1, solumedrol, and albuterol Plan to add Zyvox to previous regimen of Zosyn Requiring 4L [place], maintain O2 sats 92-93% (pt does have hx of COPD) Remains off pressors   Objective   Blood pressure 117/66, pulse (!) 55, temperature 98 F (36.7 C), temperature source Axillary, resp. rate 20, height 6' (1.829 m), weight 72.6 kg, SpO2 91 %.        Intake/Output Summary (Last 24 hours) at 10/21/2020 0858 Last data filed at 10/21/2020 0800 Gross per 24 hour  Intake 2930.09 ml  Output 2250 ml  Net 680.09 ml   Filed Weights   10/17/20 1828 10/19/20 1110  Weight: 72.6 kg 72.6 kg    Examination: General: Acutely ill appearing male, sitting in bed, on 4L [place], with mild tachypnea HENT: Atraumatic, normocephalic, neck supple, no JVD Lungs: Fine crackles to LLL, otherwise clear to auscultation, no wheezing, even, mild tachypnea Cardiovascular: Bradycardia, regular rhythm, s1s2, no M/R/G Abdomen: Soft, nontender, nondistended, no guarding or rebound tenderness, BS+ x4 Extremities: Left BKA, no edema, generalized weakness Neuro: Awake and alert, Oriented only to self and place, follows commands, no focal deficits, speech clear Skin: Warm and dry.  No obvious rashes, lesions, or ulcerations  Assessment & Plan:   Acute hypoxic respiratory failure secondary to mild central vascular congestion  New Left Infiltrate concerning for HAP COPD without acute exacerbation Hx: tobacco abuse  -Prn supplemental O2 for dyspnea and/or hypoxia  -Follow intermittent CXR & ABG as needed -Scheduled and prn bronchodilator therapy  -Smoking cessation counseling provided    Septic shock secondary to choledocholithiasis with 6 x 5 mm stone in the distal common bile duct>>resolved Mildly  elevated troponin likely secondary to demand ischemia in the setting of sepsis Paroxysmal atrial fibrillation w/ episode of A. Fib w/ RVR Hx: Ischemic Cardiomyopathy (Echo 04/2020 EF 25-30% bioprosthetic aortic valve worked appropriately), Aortic Stenosis, PAD, and CAD s/p CABG 1994, Aortic Stenosis  -Continuous telemetry monitoring  -Maintain MAP greater than 65 -Trend troponin>> peaked at 43 -Off Pressors, continue midodrine due to soft BP -Continue to Hold outpatient antihypertensives as BP soft, but MAP >65  -Echo pending>> EF 50 to 55%, normal diastolic parameters, RV function normal -Transition from Heparin gtt to home Eliquis today -Discussed with Dr. Jayme Cloud, will discontinue amiodarone drip due to bradycardia and resolution to sinus bradycardia, will continue beta-blocker.   Acute on chronic renal failure with compensated metabolic acidosis (stage III CKD) Hypokalemia  Lactic acidosis-improving  -Monitor I&O's / urinary output -Follow BMP -Ensure adequate renal perfusion -Avoid nephrotoxic agents as able -Replace electrolytes as indicated -IV fluids were initiated yesterday, currently on hold for now due to respiratory distress this morning   Sepsis secondary to choledocholithiasis with 6 x 5 mm stone in the distal common bile duct >>improved New Left Infiltrate concerning for HAP -Trend WBC and monitor fever curve  -Continue Zosyn as above, addition of Zyvox today (discussed with Dr. Jayme Cloud) -Follow cultures as above  (adding sputum cx, strep pneumo urinary antigen) -GI and general surgery consulted appreciate input  -Underwent ERCP on 10/19/20 (GI recommends repeat ERCP in 2 months to remove stent)   Anemia without obvious acute blood loss -Monitor for S/Sx of bleeding -Trend CBC -Currently on  Heparin gtt for Anticoagulation/VTE Prophylaxis, plan to transition to home Eliquis today -Transfuse for Hgb <7   Type II diabetes mellitus  -CBG's -SSI -Follow ICU  Hypo/Hyperglycemia protocol   Acute pain>>resolved -Prn morphine for pain management  Depression  -Restarted home antidepressants  Best practice (evaluated daily)  Diet: Clear liquid, advance as tolerated Pain/Anxiety/Delirium protocol (if indicated): prn morphine for pain management  VAP protocol (if indicated): not indicated  DVT prophylaxis: Heparin gtt (plan to transition to home Eliquis today) GI prophylaxis: iv protonix  Glucose control: SSI  Mobility: Bedrest for now Disposition: Stepdown  Goals of Care:   Code Status: Full Code  Updated pt at bedside 10/21/20 (pt has no family)  Labs   CBC: Recent Labs  Lab 10/17/20 1846 10/18/20 0203 10/18/20 0453 10/19/20 0633 10/20/20 0424 10/21/20 0429  WBC 13.2* 19.8* 17.2* 12.3* 5.7 10.3  NEUTROABS 11.6*  --   --  10.4* 5.0 8.6*  HGB 11.9* 11.3* 11.7* 10.1* 9.4* 9.3*  HCT 36.4* 34.8* 35.0* 30.4* 28.9* 27.8*  MCV 87.3 85.9 86.2 85.6 87.0 84.2  PLT 227 212 214 184 163 [id number]    Basic Metabolic Panel: Recent Labs  Lab 10/18/20 0203 10/18/20 0453 10/18/20 0958 10/18/20 2017 10/19/20 0633 10/20/20 0424 10/21/20 0429  NA 142 143 142  --  139 138 138  K 3.1* 3.4* 3.1*  --  3.5 3.8 3.2*  CL 107 109 109  --  108 109 107  CO2 22 20* 20*  --  20* 19* 21*  GLUCOSE 178* 120* 176*  --  138* 241* 227*  BUN 29* 27* 28*  --  27* 34* 30*  CREATININE 1.71* 1.73* 1.77*  --  1.70* 1.95* 1.77*  CALCIUM 7.2* 7.2* 6.8*  --  7.1* 7.1* 7.4*  MG 0.8*  --   --  3.0* 2.5*  --   --   PHOS 3.0  --   --   --   --   --   --    GFR: Estimated Creatinine Clearance: 36.5 mL/min (A) (by C-G formula based on SCr of 1.77 mg/dL (H)). Recent Labs  Lab 10/18/20 0203 10/18/20 0453 10/18/20 0744 10/19/20 0633 10/20/20 0424 10/20/20 0954 10/21/20 0429  PROCALCITON  --  20.65  --  7.71 3.61  --   --   WBC 19.8* 17.2*  --  12.3* 5.7  --  10.3  LATICACIDVEN 3.1*  --  2.3*  --   --  2.4*  --     Liver Function Tests: Recent Labs  Lab  10/17/20 1846 10/19/20 0633 10/20/20 0424 10/21/20 0429  AST 839* 135* 70* 38  ALT 556* 275* 206* 158*  ALKPHOS 481* 288* 276* 241*  BILITOT 1.8* 3.6* 2.0* 1.2  PROT 6.7 5.3* 5.3* 5.4*  ALBUMIN 3.5 2.5* 2.4* 2.5*   Recent Labs  Lab 10/17/20 1846 10/18/20 0212 10/20/20 2225  LIPASE 79* 1,097* 504*   No results for input(s): AMMONIA in the last 168 hours.  ABG    Component Value Date/Time   PHART 7.38 10/21/2020 0606   PCO2ART 32 10/21/2020 0606   PO2ART 58 (L) 10/21/2020 0606   HCO3 18.9 (L) 10/21/2020 0606   ACIDBASEDEF 5.5 (H) 10/21/2020 0606   O2SAT 89.1 10/21/2020 0606     Coagulation Profile: Recent Labs  Lab 10/18/20 0212 10/19/20 1459  INR 1.5* 1.2    Cardiac Enzymes: No results for input(s): CKTOTAL, CKMB, CKMBINDEX, TROPONINI in the last 168 hours.  HbA1C: Hgb A1c MFr Bld  Date/Time Value Ref Range Status  10/18/2020 09:58 AM 6.3 (H) 4.8 - 5.6 % Final    Comment:    (NOTE) Pre diabetes:          5.7%-6.4%  Diabetes:              >6.4%  Glycemic control for   <7.0% adults with diabetes     CBG: Recent Labs  Lab 10/20/20 1655 10/20/20 1936 10/20/20 2321 10/21/20 0323 10/21/20 0746  GLUCAP 358* 335* 345* 249* 180*    Review of Systems: Positives in BOLD  Gen: Denies fever, chills, weight change, fatigue, night sweats HEENT: Denies blurred vision, double vision, hearing loss, tinnitus, sinus congestion, rhinorrhea, sore throat, neck stiffness, dysphagia PULM: Denies +shortness of breath, cough, sputum production, hemoptysis, wheezing CV: chest pain, edema, orthopnea, paroxysmal nocturnal dyspnea, palpitations GI: abdominal pain, nausea, vomiting, diarrhea, hematochezia, melena, constipation, change in bowel habits GU: Denies dysuria, hematuria, polyuria, oliguria, urethral discharge Endocrine: Denies hot or cold intolerance, polyuria, polyphagia or appetite change Derm: Denies rash, dry skin, scaling or peeling skin change Heme: Denies  easy bruising, bleeding, bleeding gums Neuro: Denies headache, numbness, weakness, slurred speech, loss of memory or consciousness  Past Medical History:  He,  has a past medical history of Aortic stenosis, CAD (coronary artery disease), CKD (chronic kidney disease), stage III ([place]), COPD (chronic obstructive pulmonary disease) ([place]), Depression, Essential hypertension, GERD (gastroesophageal reflux disease), Hyperlipidemia LDL goal <70, Ischemic cardiomyopathy, PAD (peripheral artery disease) ([place]), PAF (paroxysmal atrial fibrillation) ([place]), Thyrotoxicosis, Tobacco abuse, and Type II diabetes mellitus ([place]).   Surgical History:   Past Surgical History:  Procedure Laterality Date  . CARDIAC CATHETERIZATION    . CHOLECYSTECTOMY    . CORONARY ARTERY BYPASS GRAFT     a. [place] CHOLANGIOPANCREATOGRAPHY (ERCP) WITH PROPOFOL N/A 10/19/2020   Procedure: ENDOSCOPIC RETROGRADE CHOLANGIOPANCREATOGRAPHY (ERCP) WITH PROPOFOL;  Surgeon: [name], MD;  Location: [facility] ENDOSCOPY;  Service: Endoscopy;  Laterality: N/A;  . Left AKA    . RIGHT/LEFT HEART CATH AND CORONARY ANGIOGRAPHY N/A 06/06/2020   Procedure: RIGHT/LEFT HEART CATH AND CORONARY ANGIOGRAPHY;  Surgeon: Wellington Hampshire, MD;  Location: [place] [facility];  Service: Cardiovascular;  Laterality: N/A;     Social History:   reports that he has been smoking. He has a 62.00 pack-year smoking history. He has never used smokeless tobacco. He reports that he does not drink alcohol and does not use drugs.   Family History:  His family history includes CAD in his sister; Heart attack in his mother; Heart disease in his brother; Other in his father, sister, and sister.   Allergies No Known Allergies   Home Medications  Prior to Admission medications   Medication Sig Start Date End Date Taking? Authorizing Provider  acetaminophen (TYLENOL) 500 MG tablet Take 500 mg by mouth every 6 (six) hours as needed.     [provider]  albuterol (PROVENTIL) (2.5 MG/3ML) 0.083% nebulizer solution Take 2.5 mg by nebulization every 4 (four) hours as needed for wheezing or shortness of breath.    [provider]  albuterol (VENTOLIN HFA) 108 (90 Base) MCG/ACT inhaler Inhale 2 puffs into the lungs every 6 (six) hours as needed for wheezing or shortness of breath.     [provider]  aspirin EC 81 MG tablet Take 81 mg by mouth daily.    [provider]  atorvastatin (LIPITOR) 80 MG tablet Take 80 mg by mouth at  bedtime.    [provider]  carvedilol (COREG) 3.125 MG tablet Take 1 tablet (3.125 mg total) by mouth 2 (two) times daily. 06/17/20 09/15/20  Alver Sorrow, NP  DULoxetine (CYMBALTA) 30 MG capsule Take 30 mg by mouth 2 (two) times daily.    [provider]  ferrous sulfate 325 (65 FE) MG tablet Take 325 mg by mouth daily.    [provider]  Fluticasone-Salmeterol (ADVAIR) 250-50 MCG/DOSE AEPB Inhale 1 puff into the lungs 2 (two) times daily.    [provider]  furosemide (LASIX) 80 MG tablet Take 1 tablet (80 mg total) once daily in the morning 06/20/20   Alver Sorrow, NP  hydrOXYzine (ATARAX/VISTARIL) 25 MG tablet Take 25 mg by mouth every 6 (six) hours as needed (allergy symptoms).    [provider]  isosorbide mononitrate (IMDUR) 30 MG 24 hr tablet Take 1 tablet (30 mg total) by mouth daily. 03/11/20 05/27/29  Marrion Coy, MD  ketotifen (ZADITOR) 0.025 % ophthalmic solution Place 1 drop into both eyes 2 (two) times daily as needed.     [provider]  lactulose (CHRONULAC) 10 GM/15ML solution Take 30 g by mouth every 12 (twelve) hours as needed for mild constipation.    [provider]  lamoTRIgine (LAMICTAL) 25 MG tablet Take 25 mg by mouth 2 (two) times daily.    [provider]  lidocaine (LIDODERM) 5 % Cut 1 patch in half and apply to amputated stump once daily 12 hours on, 12 hours off     [provider]  liver oil-zinc oxide (DESITIN) 40 % ointment Apply 1 application topically at bedtime as needed for irritation. To buttocks     [provider]  loperamide (IMODIUM) 2 MG capsule Take by mouth. 03/31/20   [provider]  LORazepam (ATIVAN) 0.5 MG tablet Take 0.5 mg by mouth 2 (two) times daily as needed for anxiety.    [provider]  Melatonin 3 MG TABS Take 1 tablet by mouth at bedtime.    [provider]  metFORMIN (GLUCOPHAGE) 1000 MG tablet Take by mouth. 08/04/20   [provider]  metFORMIN (GLUCOPHAGE) 500 MG tablet Take 500 mg by mouth 2 (two) times daily with a meal.     [provider]  montelukast (SINGULAIR) 10 MG tablet Take 10 mg by mouth at bedtime.    [provider]  Multiple Vitamins-Minerals (PRESERVISION AREDS 2) CAPS Take 1 capsule by mouth 2 (two) times daily.    [provider]  nicotine (NICODERM CQ - DOSED IN MG/24 HOURS) 14 mg/24hr patch Place 14 mg onto the skin daily.    [provider]  nitroGLYCERIN (NITROSTAT) 0.4 MG SL tablet Place 0.4 mg under the tongue every 5 (five) minutes as needed for chest pain.    [provider]  pantoprazole (PROTONIX) 40 MG tablet Take 40 mg by mouth daily.    [provider]  potassium chloride SA (KLOR-CON) 20 MEQ tablet Take 1 tablet (20 mEq total) by mouth daily. 06/20/20 09/18/20  Alver Sorrow, NP  Rivaroxaban (XARELTO) 15 MG TABS tablet Take 15 mg by mouth daily.    [provider]  sacubitril-valsartan (ENTRESTO) 24-26 MG Take 1 tablet by mouth 2 (two) times daily. 05/27/20   Alver Sorrow, NP  senna (SENOKOT) 8.6 MG tablet Take 2 tablets by mouth at bedtime.    [provider]  tamsulosin (FLOMAX) 0.4 MG CAPS capsule Take 0.4  mg by mouth daily.    [provider]  traZODone (DESYREL) 50 MG tablet Take 25 mg by mouth at bedtime as needed for sleep.    [provider]   triamcinolone cream (KENALOG) 0.1 % Apply 1 application topically 2 (two) times daily.    [provider]     Critical care time: 35 minutes    Darel Hong, [facility] Pager: [phone]

## 2020-10-22 DIAGNOSIS — K8309 Other cholangitis: Secondary | ICD-10-CM | POA: Diagnosis not present

## 2020-10-22 LAB — COMPREHENSIVE METABOLIC PANEL
ALT: 124 U/L — ABNORMAL HIGH (ref 0–44)
AST: 27 U/L (ref 15–41)
Albumin: 2.4 g/dL — ABNORMAL LOW (ref 3.5–5.0)
Alkaline Phosphatase: 220 U/L — ABNORMAL HIGH (ref 38–126)
Anion gap: 10 (ref 5–15)
BUN: 26 mg/dL — ABNORMAL HIGH (ref 8–23)
CO2: 20 mmol/L — ABNORMAL LOW (ref 22–32)
Calcium: 8 mg/dL — ABNORMAL LOW (ref 8.9–10.3)
Chloride: 108 mmol/L (ref 98–111)
Creatinine, Ser: 1.73 mg/dL — ABNORMAL HIGH (ref 0.61–1.24)
GFR, Estimated: 40 mL/min — ABNORMAL LOW (ref >60)
Glucose, Bld: 228 mg/dL — ABNORMAL HIGH (ref 70–99)
Potassium: 4.4 mmol/L (ref 3.5–5.1)
Sodium: 138 mmol/L (ref 135–145)
Total Bilirubin: 1.1 mg/dL (ref 0.3–1.2)
Total Protein: 5.4 g/dL — ABNORMAL LOW (ref 6.5–8.1)

## 2020-10-22 LAB — CBC WITH DIFFERENTIAL/PLATELET
Abs Immature Granulocytes: 0.09 10*3/uL — ABNORMAL HIGH (ref 0.00–0.07)
Basophils Absolute: 0 10*3/uL (ref 0.0–0.1)
Basophils Relative: 0 %
Eosinophils Absolute: 0 10*3/uL (ref 0.0–0.5)
Eosinophils Relative: 0 %
HCT: 27.8 % — ABNORMAL LOW (ref 39.0–52.0)
Hemoglobin: 9.2 g/dL — ABNORMAL LOW (ref 13.0–17.0)
Immature Granulocytes: 1 %
Lymphocytes Relative: 8 %
Lymphs Abs: 1 10*3/uL (ref 0.7–4.0)
MCH: 28.2 pg (ref 26.0–34.0)
MCHC: 33.1 g/dL (ref 30.0–36.0)
MCV: 85.3 fL (ref 80.0–100.0)
Monocytes Absolute: 1 10*3/uL (ref 0.1–1.0)
Monocytes Relative: 8 %
Neutro Abs: 10.7 10*3/uL — ABNORMAL HIGH (ref 1.7–7.7)
Neutrophils Relative %: 83 %
Platelets: 174 10*3/uL (ref 150–400)
RBC: 3.26 MIL/uL — ABNORMAL LOW (ref 4.22–5.81)
RDW: 14.6 % (ref 11.5–15.5)
WBC: 12.8 10*3/uL — ABNORMAL HIGH (ref 4.0–10.5)
nRBC: 0 % (ref 0.0–0.2)

## 2020-10-22 LAB — GLUCOSE, CAPILLARY
Glucose-Capillary: 130 mg/dL — ABNORMAL HIGH (ref 70–99)
Glucose-Capillary: 185 mg/dL — ABNORMAL HIGH (ref 70–99)
Glucose-Capillary: 187 mg/dL — ABNORMAL HIGH (ref 70–99)
Glucose-Capillary: 195 mg/dL — ABNORMAL HIGH (ref 70–99)
Glucose-Capillary: 212 mg/dL — ABNORMAL HIGH (ref 70–99)
Glucose-Capillary: 296 mg/dL — ABNORMAL HIGH (ref 70–99)

## 2020-10-22 LAB — LIPASE, BLOOD: Lipase: 354 U/L — ABNORMAL HIGH (ref 11–51)

## 2020-10-22 LAB — AMYLASE: Amylase: 218 U/L — ABNORMAL HIGH (ref 28–100)

## 2020-10-22 LAB — MAGNESIUM: Magnesium: 2 mg/dL (ref 1.7–2.4)

## 2020-10-22 MED ORDER — POTASSIUM CHLORIDE CRYS ER 20 MEQ PO TBCR
40.0000 meq | EXTENDED_RELEASE_TABLET | Freq: Once | ORAL | Status: AC
  Administered 2020-10-22: 40 meq via ORAL
  Filled 2020-10-22: qty 2

## 2020-10-22 MED ORDER — AMOXICILLIN-POT CLAVULANATE 875-125 MG PO TABS
1.0000 | ORAL_TABLET | Freq: Two times a day (BID) | ORAL | Status: DC
  Administered 2020-10-22 – 2020-10-25 (×7): 1 via ORAL
  Filled 2020-10-22 (×7): qty 1

## 2020-10-22 MED ORDER — CHLORHEXIDINE GLUCONATE CLOTH 2 % EX PADS
6.0000 | MEDICATED_PAD | Freq: Every day | CUTANEOUS | Status: DC
  Administered 2020-10-22 – 2020-10-25 (×4): 6 via TOPICAL

## 2020-10-22 NOTE — Progress Notes (Signed)
PROGRESS NOTE    Shawn Jennings  FGH:[id number] DOB: 10/11/1946 DOA: 10/17/2020 PCP: Almetta Lovely, [facility]   Chief Complain:  Brief Narrative: 75 year old male from ALF with history of paroxysmal A. fib who was initially admitted under [facility] service had to be moved to ICU after he developed septic shock from ascending cholangitis.  Underwent ERCP with a stent placement.  Hospital course remarkable for requirement of  pressors. He was transferred to our service on 10/21/2020.  Hospital course also remarkable for A. fib with RVR, AKI on CKD stage III.  Overall condition is improving and is nearing to discharge planning.  Assessment & Plan:   Active Problems:   Choledocholithiasis   Protein-calorie malnutrition, severe   Cholangitis  Acute on chronic hypoxic respiratory failure:  Most likely this is from COPD exacerbation.  Currently respiratory status stable. He still smokes.  Continue to wean the oxygen.  He is not on oxygen at home.  Continue bronchodilators.  Septic shock secondary to ascending cholangitis: Needed vasopressor support after hospitalization and had to be transferred to ICU.  Currently blood pressure stable without vasopressors.  Cultures negative so far.  He was on Zosyn and zyvox.  GI recommended to follow-up as an outpatient for ERCP in 2 months to remove the stent.  Blood pressure was soft so started on midodrine.  Now stable.  Changed antibiotics to Augmentin, to be continued for total of 2 weeks course  Ascending cholangitis : Found to have 6X 5 mm stone in the distal common bile duct.  GI was consulted and he underwent ERCP with stone extraction.  Continue Zosyn.  Follow-up cultures.  Abx chnaged to oral  Elevated troponin: Most likely secondary to supply demand ischemia.  Denies any chest pain.  Ischemic cardiomyopathy: Currently euvolumic.  Echo on 04/2020 showed ejection fraction of 25 to 30%, bioprosthetic aortic valve, aortic stenosis.  He has history of CABG  in 1994.  Continue home medications.  He is on aspirin, Lipitor, carvedilol, Imdur, Entresto.Echo during this hospitalization showed ejection fraction of 50-55%, normal diastolic parameters.  A. fib with RVR: History of paroxysmal A. fib.  Currently in sinus rhythm.  On went into RVR during his hospitalization.  On xarelto.Given a dose of IV amiodarone.  Currently heart rate is stable .  Continue Coreg that he takes at home.  Monitor on telemetry.  AKI on CKD stage IIIa: Currently kidney function at baseline.  He was given some IV fluids which has been stopped now.  Baseline creatinine in the range of 1.4-1.6  Normocytic anemia: Monitor hemoglobin which is currently stable.  Transfuse if less than 7.  Diabetes mellitus type 2: Monitor CBGs.  Continue sliding scale insulin and Lantus.  Takes Metformin at home.HbA1C of .  Depression: Continue home antidepressant  Hypokalemia: Supplemented with potassium.  Hyperlipidemia: On Lipitor  Tobacco abuse: Counseled for cessation.  Continue nicotine patch  Debility/deconditioning: Status post left AKA.  Nursing facility resident.  Will request PT/OT evaluation      Nutrition Problem: Severe Malnutrition Etiology: chronic illness (COPD)      DVT prophylaxis:Xarelto Code Status: Full Family Communication: None at bedside Status is: Inpatient  Not inpatient appropriate, will call UM team and downgrade to OBS.   Dispo: The patient is from: ALF              Anticipated d/c is to:ALF vs  SNF              Anticipated d/c date is: 1-2 days  Patient currently is not medically stable to d/c.     Consultants: PCCM  Procedures:ERCP  Antimicrobials:  Anti-infectives (From admission, onward)   Start     Dose/Rate Route Frequency Ordered Stop   10/21/20 1000  linezolid (ZYVOX) tablet 600 mg        600 mg Oral Every 12 hours 10/21/20 0849     10/19/20 1000  vancomycin (VANCOCIN) IVPB 1000 mg/200 mL premix  Status:  Discontinued        "Followed by" Linked Group Details   1,000 mg 200 mL/hr over 60 Minutes Intravenous Every 24 hours 10/18/20 0335 10/19/20 1132   10/18/20 0500  piperacillin-tazobactam (ZOSYN) IVPB 3.375 g        3.375 g 12.5 mL/hr over 240 Minutes Intravenous Every 8 hours 10/18/20 0338     10/18/20 0430  vancomycin (VANCOREADY) IVPB 1500 mg/300 mL       "Followed by" Linked Group Details   1,500 mg 150 mL/hr over 120 Minutes Intravenous  Once 10/18/20 0335 10/18/20 0536   10/18/20 0415  metroNIDAZOLE (FLAGYL) IVPB 500 mg  Status:  Discontinued        500 mg 100 mL/hr over 60 Minutes Intravenous Every 8 hours 10/18/20 0326 10/18/20 0332   10/18/20 0415  vancomycin (VANCOCIN) IVPB 1000 mg/200 mL premix  Status:  Discontinued        1,000 mg 200 mL/hr over 60 Minutes Intravenous  Once 10/18/20 0326 10/18/20 0331   10/17/20 2100  piperacillin-tazobactam (ZOSYN) IVPB 3.375 g        3.375 g 100 mL/hr over 30 Minutes Intravenous  Once 10/17/20 2056 10/17/20 2229      Subjective:  Patient seen and examined the bedside this morning.  Hemodynamically stable.  Feels much better today.  Denies any shortness of breath or abdomen pain.  Objective: Vitals:   10/22/20 0445 10/22/20 0449 10/22/20 0728 10/22/20 0731  BP: 121/67 122/67 126/69   Pulse: (!) 53 (!) 54 (!) 57   Resp: 18  (!) 22   Temp: 98.1 F (36.7 C)  97.7 F (36.5 C)   TempSrc:   Oral   SpO2: 97% 97% 92% 90%  Weight: (P) 70.7 kg     Height:        Intake/Output Summary (Last 24 hours) at 10/22/2020 0824 Last data filed at 10/21/2020 2200 Gross per 24 hour  Intake 958.73 ml  Output -  Net 958.73 ml   Filed Weights   10/21/20 1648 10/22/20 0318 10/22/20 0445  Weight: 64.8 kg 70.7 kg (P) 70.7 kg    Examination:   General exam: Chronically ill looking, deconditioned, debilitated HEENT:PERRL,Oral mucosa moist, Ear/Nose normal on gross exam Respiratory system: Bilateral mildly diminished air sounds, no frank wheezes or crackles   cardiovascular system: S1 & S2 heard, RRR. No JVD, murmurs, rubs, gallops or clicks. Gastrointestinal system: Abdomen is nondistended, soft and nontender. No organomegaly or masses felt. Normal bowel sounds heard. Central nervous system: Alert and oriented. No focal neurological deficits. Extremities: No edema, no clubbing ,no cyanosis, left AKA Skin: No rashes, lesions or ulcers,no icterus ,no pallor  Data Reviewed: I have personally reviewed following labs and imaging studies  CBC: Recent Labs  Lab 10/17/20 1846 10/18/20 0203 10/18/20 0453 10/19/20 [id number] 10/20/20 0424 10/21/20 0429 10/22/20 0415  WBC 13.2*   < > 17.2* 12.3* 5.7 10.3 12.8*  NEUTROABS 11.6*  --   --  10.4* 5.0 8.6* 10.7*  HGB 11.9*   < > 11.7* 10.1* 9.4*  9.3* 9.2*  HCT 36.4*   < > 35.0* 30.4* 28.9* 27.8* 27.8*  MCV 87.3   < > 86.2 85.6 87.0 84.2 85.3  PLT 227   < > 214 184 163 170 174   < > = values in this interval not displayed.   Basic Metabolic Panel: Recent Labs  Lab 10/18/20 0203 10/18/20 0453 10/18/20 0958 10/18/20 2017 10/19/20 [id number] 10/20/20 0424 10/21/20 0429 10/22/20 0415  NA 142   < > 142  --  139 138 138 138  K 3.1*   < > 3.1*  --  3.5 3.8 3.2* 4.4  CL 107   < > 109  --  108 109 107 108  CO2 22   < > 20*  --  20* 19* 21* 20*  GLUCOSE 178*   < > 176*  --  138* 241* 227* 228*  BUN 29*   < > 28*  --  27* 34* 30* 26*  CREATININE 1.71*   < > 1.77*  --  1.70* 1.95* 1.77* 1.73*  CALCIUM 7.2*   < > 6.8*  --  7.1* 7.1* 7.4* 8.0*  MG 0.8*  --   --  3.0* 2.5*  --   --  2.0  PHOS 3.0  --   --   --   --   --   --   --    < > = values in this interval not displayed.   GFR: Estimated Creatinine Clearance: 36.3 mL/min (A) (by C-G formula based on SCr of 1.73 mg/dL (H)). Liver Function Tests: Recent Labs  Lab 10/17/20 1846 10/19/20 0633 10/20/20 0424 10/21/20 0429 10/22/20 0415  AST 839* 135* 70* 38 27  ALT 556* 275* 206* 158* 124*  ALKPHOS 481* 288* 276* 241* 220*  BILITOT 1.8* 3.6* 2.0* 1.2  1.1  PROT 6.7 5.3* 5.3* 5.4* 5.4*  ALBUMIN 3.5 2.5* 2.4* 2.5* 2.4*   Recent Labs  Lab 10/17/20 1846 10/18/20 0212 10/20/20 2225 10/22/20 0415  LIPASE 79* 1,097* 504* 354*  AMYLASE  --   --   --  218*   No results for input(s): AMMONIA in the last 168 hours. Coagulation Profile: Recent Labs  Lab 10/18/20 0212 10/19/20 1459  INR 1.5* 1.2   Cardiac Enzymes: No results for input(s): CKTOTAL, CKMB, CKMBINDEX, TROPONINI in the last 168 hours. BNP (last 3 results) No results for input(s): PROBNP in the last 8760 hours. HbA1C: No results for input(s): HGBA1C in the last 72 hours. CBG: Recent Labs  Lab 10/21/20 1545 10/21/20 2146 10/22/20 0023 10/22/20 0450 10/22/20 0809  GLUCAP 332* 322* 296* 195* 130*   Lipid Profile: No results for input(s): CHOL, HDL, LDLCALC, TRIG, CHOLHDL, LDLDIRECT in the last 72 hours. Thyroid Function Tests: No results for input(s): TSH, T4TOTAL, FREET4, T3FREE, THYROIDAB in the last 72 hours. Anemia Panel: No results for input(s): VITAMINB12, FOLATE, FERRITIN, TIBC, IRON, RETICCTPCT in the last 72 hours. Sepsis Labs: Recent Labs  Lab 10/18/20 0203 10/18/20 0453 10/18/20 0744 10/19/20 0633 10/20/20 0424 10/20/20 0954  PROCALCITON  --  20.65  --  7.71 3.61  --   LATICACIDVEN 3.1*  --  2.3*  --   --  2.4*    Recent Results (from the past 240 hour(s))  Resp Panel by RT-PCR (Flu A&B, Covid) Nasopharyngeal Swab     Status: None   Collection Time: 10/17/20  9:19 PM   Specimen: Nasopharyngeal Swab; Nasopharyngeal(NP) swabs in vial transport medium  Result Value Ref Range Status  SARS Coronavirus 2 by RT PCR NEGATIVE NEGATIVE Final    Comment: (NOTE) SARS-CoV-2 target nucleic acids are NOT DETECTED.  The SARS-CoV-2 RNA is generally detectable in upper respiratory specimens during the acute phase of infection. The lowest concentration of SARS-CoV-2 viral copies this assay can detect is 138 copies/mL. A negative result does not preclude  SARS-Cov-2 infection and should not be used as the sole basis for treatment or other patient management decisions. A negative result may occur with  improper specimen collection/handling, submission of specimen other than nasopharyngeal swab, presence of viral mutation(s) within the areas targeted by this assay, and inadequate number of viral copies(<138 copies/mL). A negative result must be combined with clinical observations, patient history, and epidemiological information. The expected result is Negative.  Fact Sheet for Patients:  EntrepreneurPulse.com.au  Fact Sheet for Healthcare Providers:  IncredibleEmployment.be  This test is no t yet approved or cleared by the [place] FDA and  has been authorized for detection and/or diagnosis of SARS-CoV-2 by FDA under an Emergency Use Authorization (EUA). This EUA will remain  in effect (meaning this test can be used) for the duration of the COVID-19 declaration under Section 564(b)(1) of the Act, 21 U.S.C.section 360bbb-3(b)(1), unless the authorization is terminated  or revoked sooner.       Influenza A by PCR NEGATIVE NEGATIVE Final   Influenza B by PCR NEGATIVE NEGATIVE Final    Comment: (NOTE) The Xpert Xpress SARS-CoV-2/FLU/RSV plus assay is intended as an aid in the diagnosis of influenza from Nasopharyngeal swab specimens and should not be used as a sole basis for treatment. Nasal washings and aspirates are unacceptable for Xpert Xpress SARS-CoV-2/FLU/RSV testing.  Fact Sheet for Patients: EntrepreneurPulse.com.au  Fact Sheet for Healthcare Providers: IncredibleEmployment.be  This test is not yet approved or cleared by the [place] FDA and has been authorized for detection and/or diagnosis of SARS-CoV-2 by FDA under an Emergency Use Authorization (EUA). This EUA will remain in effect (meaning this test can be used) for the duration of  the COVID-19 declaration under Section 564(b)(1) of the Act, 21 U.S.C. section 360bbb-3(b)(1), unless the authorization is terminated or revoked.  Performed at [facility], [address] [place], [place] [address]   MRSA PCR Screening     Status: Abnormal   Collection Time: 10/18/20 12:17 AM   Specimen: Nasal Mucosa; Nasopharyngeal  Result Value Ref Range Status   MRSA by PCR POSITIVE (A) NEGATIVE Final    Comment:        The GeneXpert MRSA Assay (FDA approved for NASAL specimens only), is one component of a comprehensive MRSA colonization surveillance program. It is not intended to diagnose MRSA infection nor to guide or monitor treatment for MRSA infections. RESULT CALLED TO, READ BACK BY AND VERIFIED WITH: MARSHA HATCH @0204  ON 10/18/20 SKL Performed at [facility], [address] [place], [place] [address]   Culture, blood (routine x 2)     Status: None (Preliminary result)   Collection Time: 10/18/20  4:41 AM   Specimen: BLOOD  Result Value Ref Range Status   Specimen Description BLOOD LEFT WRIST  Final   Special Requests   Final    BOTTLES DRAWN AEROBIC AND ANAEROBIC Blood Culture adequate volume   Culture   Final    NO GROWTH 4 DAYS Performed at [facility], [address], [place], [place] [address]    Report Status PENDING  Incomplete  Culture, blood (routine x 2)  Status: None (Preliminary result)   Collection Time: 10/18/20  4:53 AM   Specimen: BLOOD  Result Value Ref Range Status   Specimen Description BLOOD LEFT ASSIST CONTROL  Final   Special Requests   Final    BOTTLES DRAWN AEROBIC AND ANAEROBIC Blood Culture adequate volume   Culture   Final    NO GROWTH 4 DAYS Performed at [facility], [address], [place], [place] [address]    Report Status PENDING  Incomplete  Urine Culture     Status: None   Collection Time: 10/18/20  9:02 AM   Specimen: Urine, Random  Result Value Ref Range Status    Specimen Description   Final    URINE, RANDOM Performed at [facility], [address], [place], [place] [address]    Special Requests   Final    NONE Performed at [facility], [address], [place],  [address]    Culture   Final    NO GROWTH Performed at [facility], [place] [address], [place],  [address]    Report Status 10/19/2020 FINAL  Final         Radiology Studies: DG Abd 1 View  Result Date: 10/20/2020 CLINICAL DATA:  Abdominal pain.  Patient reports constipation. EXAM: ABDOMEN - 1 VIEW COMPARISON:  CT 09/17/2020 FINDINGS: Plastic biliary stent in place. Cholecystectomy clips in the right upper quadrant. Moderate stool in the right colon. Air-filled distal transverse colon. Small volume of air and stool throughout the more distal colon. Patulous loop of small bowel in the left mid abdomen adjacent to enteric sutures, also seen on prior CT. There is no evidence of obstruction. Advanced vascular calcifications. No free intra-abdominal air on supine view. No acute osseous abnormalities are seen. IMPRESSION: 1. Nonobstructive bowel gas pattern. Moderate stool in the right colon. 2. Plastic biliary stent in place. 3. Patulous loop of small bowel in the left mid abdomen adjacent to enteric sutures, also seen on prior CT. Electronically Signed   By: Keith Rake M.D.   On: 10/20/2020 18:54   DG Chest Port 1 View  Result Date: 10/21/2020 CLINICAL DATA:  75 year old male with respiratory failure, hypoxia. Choledocholithiasis. EXAM: PORTABLE CHEST 1 VIEW COMPARISON:  Portable chest 10/18/2020 and earlier. FINDINGS: Portable AP upright view at 0612 hours. Stable right IJ central line. Prior TAVR. Stable cardiac size and mediastinal contours. Patchy and confluent new left mid lung, perihilar opacity. Stable lung volumes and elsewhere lung markings are stable. No pneumothorax. Small veiling left pleural effusion also now suspected. Nonobstructed  visible bowel gas pattern. No acute osseous abnormality identified. IMPRESSION: 1. Confluent new left mid lung opacity most suspicious for Pneumonia. Aspiration also a consideration. 2. Small new left pleural effusion suspected. Electronically Signed   By: Genevie Ann M.D.   On: 10/21/2020 06:27        Scheduled Meds: . atorvastatin  80 mg Oral QHS  . carvedilol  3.125 mg Oral BID  . Chlorhexidine Gluconate Cloth  6 each Topical Daily  . docusate sodium  100 mg Oral BID  . DULoxetine  30 mg Oral BID  . feeding supplement  237 mL Oral TID BM  . ferrous sulfate  325 mg Oral Daily  . insulin aspart  0-20 Units Subcutaneous TID WC  . insulin aspart  0-5 Units Subcutaneous QHS  . insulin detemir  20 Units Subcutaneous QHS  . lamoTRIgine  25 mg Oral BID  . linezolid  600 mg  Oral Q12H  . melatonin  2.5 mg Oral QHS  . midodrine  5 mg Oral TID WC  . mometasone-formoterol  2 puff Inhalation BID  . multivitamin with minerals  1 tablet Oral Daily  . multivitamin-lutein  1 capsule Oral BID  . mupirocin ointment  1 application Nasal BID  . nicotine  14 mg Transdermal Daily  . pantoprazole (PROTONIX) IV  40 mg Intravenous Q24H  . Rivaroxaban  15 mg Oral Daily  . senna  2 tablet Oral QHS  . tamsulosin  0.4 mg Oral Daily   Continuous Infusions: . sodium chloride Stopped (10/21/20 0558)  . piperacillin-tazobactam (ZOSYN)  IV 3.375 g (10/22/20 0700)     LOS: 5 days    Time spent: 35 mins.More than 50% of that time was spent in counseling and/or coordination of care.      Shelly Coss, MD [facility] P1/10/2020, 8:24 AM

## 2020-10-22 NOTE — Evaluation (Signed)
Clinical/Bedside Swallow Evaluation Patient Details  Name: Shawn Jennings MRN: [id number] Date of Birth: 06/15/1946  Today's Date: 10/22/2020 Time: SLP Start Time (ACUTE ONLY): 87 SLP Stop Time (ACUTE ONLY): 1005 SLP Time Calculation (min) (ACUTE ONLY): 50 min  Past Medical History:  Past Medical History:  Diagnosis Date  . Aortic stenosis    a. Pt unaware of history but CT imaging 01/2019 consistent w/ AVR; b. 01/2019 Echo: Mild to mod AS. Mean grad 32mmHg.  Marland Kitchen CAD (coronary artery disease)    a. 1994 s/p CABG ([place]); b. Reports multiple stress tests over the years w/o repeat cath; c. 01/2019 MV: large, sev, fixed inf and inflat defect extending to apex. No ischemia. EF 37%-->Med rx given atypical Ss and pt wishes.  . CKD (chronic kidney disease), stage III ([place])   . COPD (chronic obstructive pulmonary disease) ([place])   . Depression   . Essential hypertension   . GERD (gastroesophageal reflux disease)   . Hyperlipidemia LDL goal <70   . Ischemic cardiomyopathy    a. 01/2019 Echo: EF 40-45%, impaired relaxation. Mildly dil LA. Sev mitral annular Ca2+. Mild to mod AS.  Marland Kitchen PAD (peripheral artery disease) ([place])    a. 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  Marland Kitchen PAF (paroxysmal atrial fibrillation) ([facility])    a. CHA2DS2VASc = 4-->Xarelto 15mg  daily in setting of CKD.  Marland Kitchen Thyrotoxicosis   . Tobacco abuse   . Type II diabetes mellitus ([place])    Past Surgical History:  Past Surgical History:  Procedure Laterality Date  . CARDIAC CATHETERIZATION    . CHOLECYSTECTOMY    . CORONARY ARTERY BYPASS GRAFT     a. [place] CHOLANGIOPANCREATOGRAPHY (ERCP) WITH PROPOFOL N/A 10/19/2020   Procedure: ENDOSCOPIC RETROGRADE CHOLANGIOPANCREATOGRAPHY (ERCP) WITH PROPOFOL;  Surgeon: [name], MD;  Location: [facility] ENDOSCOPY;  Service: Endoscopy;  Laterality: N/A;  . Left AKA    . RIGHT/LEFT HEART CATH AND CORONARY ANGIOGRAPHY N/A 06/06/2020   Procedure: RIGHT/LEFT HEART CATH AND CORONARY  ANGIOGRAPHY;  Surgeon: Wellington Hampshire, MD;  Location: [place] [facility];  Service: Cardiovascular;  Laterality: N/A;   HPI:  Pt is a 75 y.o male with PMH significant for CAD s/p CABG, Ischemic Cardiomyopathy, HFrEF, ASA s/p AVR, CKD stage lll, GERD, COPD,HLD, HTN, PAD s/p L AKA (2016), PAF on Xarelto?, Thyrotoxicosis, Tobacco abuse, ETOH use, and Type ll DM admitted with choledocholithiasis and AKI now found to be septic. Pt seen by GI for severe gall stone pancreatitis; S/P ERCP 10/19/2020 with extraction of sludge and placement of stent .LFT's improving , AKI worsening.  Pt may advance diet as tolerates per notes.  CXR upon admit(and prior one): No active cardiopulmonary disease but now new mid Left lung opacity. (pt has been drinking lying down in the bed per his report and this observation)   Per chart, a DG Esophagus in 02/2020 revealed prominence of the cricopharyngeus fold/muscle; small sliding hiatal hernia then.   Assessment / Plan / Recommendation Clinical Impression  Pt appears to present w/ grossly adequate oropharyngeal phase swallow w/ No oropharyngeal phase dysphagia noted, No neuromuscular deficits noted. Pt consumed po trials w/ No immediate, overt clinical s/s of aspiration during po trials. However, pt has Baseline issues that can impact his swallowing: Esophageal issues, Missing Bottom Dentition for effective mastication; and declined Pulmonary status w/ increased WOB w/ any exertion including talking and moving in bed. Pt appears at reduced risk for aspiration if following general aspiration precautions and  modifying his diet slightly in order to lessen demand; focus of conservation of energy.  During po trials, pt consumed all consistencies w/ no immeidate, overt coughing, decline in vocal quality, or sustained change in respiratory presentation during/post trials. Rest Breaks and time for [place] encouraged in order to lessen air and given Time for breathing to calm RR when any  WOB noted from the exertion of self-feeding, and po's.  Oral phase appeared grossly [facility] w/ timely bolus management, mashing, and control of bolus propulsion for A-P transfer for swallowing. Oral clearing achieved w/ all trial consistencies given min time when needed. OM Exam appeared [facility] w/ no unilateral weakness noted. Speech Clear. Pt fed self w/ setup support. Recommend a more Mech Soft consistency diet w/ well-Chopped meats, moistened foods; Thin liquids VIA CUP - less air swallowed and better oral control of bolus sipped. Recommend general aspiration precautions, Pills WHOLE in Puree for safer, easier swallowing IF needed for ease of swallow per NSG. Education given on Pills in Puree; food consistencies and easy to eat options; general aspiration precautions; need for Rest Breaks during meals to calm Breathing; GERD precautions -- lessening bulky meats/breads for clearing the UES easier. Discussed ways to conserve energy during meals to lessen risk for choking/aspiration. Handouts given. NSG to reconsult if any new needs arise. NSG agreed. Pt agreed. SLP Visit Diagnosis: Dysphagia, unspecified (R13.10) (declined Pulmonary status; missing Bottom teeth)    Aspiration Risk  Mild aspiration risk;Risk for inadequate nutrition/hydration (reduced following general precautions)    Diet Recommendation  Mech Soft diet w/ moistened foods, less tough breads; Thin liquids VIA CUP - less straw use for less air swallowed. General aspiration precautions; GERD precautions. Rest Breaks during meals for conservation of energy.  Medication Administration: Whole meds with puree (IF needed for ease of swallowing)    Other  Recommendations Recommended Consults: Consider GI evaluation;Consider esophageal assessment (f/u for CP prominence and GERD management) Oral Care Recommendations: Oral care BID;Patient independent with oral care Other Recommendations:  (n/a)   Follow up Recommendations None      Frequency and  Duration  (n/a)   (n/a)       Prognosis Prognosis for Safe Diet Advancement: Fair Barriers to Reach Goals: Time post onset;Severity of deficits;Behavior      Swallow Study   General Date of Onset: 10/18/20 HPI: Pt is a 75 y.o male with PMH significant for CAD s/p CABG, Ischemic Cardiomyopathy, HFrEF, ASA s/p AVR, CKD stage lll, GERD, COPD,HLD, HTN, PAD s/p L AKA (2016), PAF on Xarelto?, Thyrotoxicosis, Tobacco abuse, ETOH use, and Type ll DM admitted with choledocholithiasis and AKI now found to be septic. Pt seen by GI for severe gall stone pancreatitis; S/P ERCP 10/19/2020 with extraction of sludge and placement of stent .LFT's improving , AKI worsening.  Pt may advance diet as tolerates per notes.  CXR upon admit(and prior one): No active cardiopulmonary disease but now new mid Left lung opacity. (pt has been drinking lying down in the bed per his report and this observation)   Per chart, a DG Esophagus in 02/2020 revealed prominence of the cricopharyngeus fold/muscle; small sliding hiatal hernia then. Type of Study: Bedside Swallow Evaluation Previous Swallow Assessment: none Diet Prior to this Study: Thin liquids (Full liquids post procedure) Temperature Spikes Noted: No (wbc 12.8) Respiratory Status: Nasal cannula (4L) History of Recent Intubation: No Behavior/Cognition: Alert;Cooperative;Pleasant mood (easily WOB/SOB w/ any exertion) Oral Cavity Assessment: Within Functional Limits Oral Care Completed by SLP: Recent completion by staff (  pt also eating) Oral Cavity - Dentition: Dentures, top (no bottom dentition) Vision: Functional for self-feeding Self-Feeding Abilities: Able to feed self;Needs set up (needed positioning) Patient Positioning: Upright in bed (needed positioning) Baseline Vocal Quality: Normal (gravely) Volitional Cough: Strong;Congested Volitional Swallow: Able to elicit    Oral/Motor/Sensory Function Overall Oral Motor/Sensory Function: Within functional limits    Ice Chips Ice chips: Not tested   Thin Liquid Thin Liquid: Within functional limits Presentation: Cup;Self Fed (10+ trials) Other Comments: water, coffee    Nectar Thick Nectar Thick Liquid: Not tested   Honey Thick Honey Thick Liquid: Not tested   Puree Puree: Within functional limits Presentation: Self Fed;Spoon (10+ trials)   Solid     Solid: Impaired Presentation: Self Fed;Spoon (6 trials) Oral Phase Impairments: Impaired mastication (no bottom dentition; min inc'd WOB) Pharyngeal Phase Impairments:  (none) Other Comments: rest breaks        Jerilynn Som, MS, [employer] Speech Language Pathologist Rehab Services [phone] [facility] 10/22/2020,12:03 PM

## 2020-10-22 NOTE — Evaluation (Signed)
Occupational Therapy Evaluation Patient Details Name: Shawn Jennings MRN: [id number] DOB: 12-Feb-1946 Today's Date: 10/22/2020    History of Present Illness 75 y.o male with PMH significant for CAD s/p CABG, Ischemic Cardiomyopathy, HFrEF, ASA s/p AVR, CKD stage lll, GERD, COPD,HLD, HTN, PAD s/p L AKA (2016), PAF on Xarelto?, Thyrotoxicosis, Tobacco abuse and Type ll DM admitted with choledocholithiasis and AKI now found to be septic.   Clinical Impression   [name] was seen for OT evaluation this date. Prior to hospital admission, pt was MOD I for mobility using w/c scoot transfers and required assist for bathing, IADLs. Pt lives at [facility] and baseline on 4L [place]. Pt presents to acute OT demonstrating impaired ADL performance and functional mobility 2/2 decreased activity tolerance and functional strength deficits.   Pt currently requires MOD I self-feeding and don R sock at bed level. MIN A for lateral scoot t/f bed<>chair. Pt reports 5/10 fatigue following t/f and SpO2 desat 90% on 4L [place]. Pt would benefit from skilled OT to address noted impairments and functional limitations (see below for any additional details) in order to maximize safety and independence while minimizing falls risk and caregiver burden. Upon hospital discharge, recommend HHOT to maximize pt safety and return to functional independence during meaningful occupations of daily life.     Follow Up Recommendations  Home health OT;Supervision/Assistance - 24 hour    Equipment Recommendations  None recommended by OT    Recommendations for Other Services       Precautions / Restrictions Precautions Precautions: Fall Restrictions Weight Bearing Restrictions: No      Mobility Bed Mobility Overal bed mobility: Needs Assistance Bed Mobility: Supine to Sit;Sit to Supine;Rolling Rolling: Min guard   Supine to sit: Min guard Sit to supine: Min guard        Transfers Overall transfer level: Needs  assistance   Transfers: Lateral/Scoot Transfers          Lateral/Scoot Transfers: Min assist      Balance Overall balance assessment: Mild deficits observed, not formally tested                                         ADL either performed or assessed with clinical judgement   ADL Overall ADL's : Needs assistance/impaired                                       General ADL Comments: MOD I self-feeding and don R sock at bed level. MIN A for ADL scoot t/f                  Pertinent Vitals/Pain Pain Assessment: No/denies pain     Hand Dominance Right   Extremity/Trunk Assessment Upper Extremity Assessment Upper Extremity Assessment: Generalized weakness   Lower Extremity Assessment Lower Extremity Assessment: LLE deficits/detail;Generalized weakness LLE Deficits / Details: L AKA       Communication Communication Communication: No difficulties   Cognition Arousal/Alertness: Awake/alert Behavior During Therapy: WFL for tasks assessed/performed Overall Cognitive Status: Within Functional Limits for tasks assessed                                     General Comments  SpO2 90% on 4L  [place] following mobility    Exercises Exercises: Other exercises Other Exercises Other Exercises: Pt educated re: OT role, DME recs, d/c recs, falls prevention, ECS Other Exercises: LBD, simulated toilet transfer, self-feeding   Shoulder Instructions      Home Living Family/patient expects to be discharged to:: Assisted living                             Home Equipment: Shower seat;Wheelchair - manual          Prior Functioning/Environment Level of Independence: Needs assistance;Independent with assistive device(s)        Comments: Reports he is from [employer] on 4L [place] baseline. Reports requires assistance for bathing and IADLs. MOD I w/c transfers        OT Problem List: Decreased strength;Decreased  activity tolerance;Decreased safety awareness      OT Treatment/Interventions: Self-care/ADL training;Therapeutic exercise;Energy conservation;DME and/or AE instruction;Therapeutic activities;Patient/family education;Balance training    OT Goals(Current goals can be found in the care plan section) Acute Rehab OT Goals Patient Stated Goal: To return to PLOF OT Goal Formulation: With patient Time For Goal Achievement: 11/05/20 Potential to Achieve Goals: Good ADL Goals Pt Will Perform Grooming: with modified independence;sitting Pt Will Transfer to Toilet: with modified independence;with transfer board;bedside commode Pt/caregiver will Perform Home Exercise Program: Increased strength;Both right and left upper extremity;Independently  OT Frequency: Min 1X/week    AM-PAC OT "6 Clicks" Daily Activity     Outcome Measure Help from another person eating meals?: None Help from another person taking care of personal grooming?: None Help from another person toileting, which includes using toliet, bedpan, or urinal?: A Little Help from another person bathing (including washing, rinsing, drying)?: A Little Help from another person to put on and taking off regular upper body clothing?: None Help from another person to put on and taking off regular lower body clothing?: None 6 Click Score: 22   End of Session Equipment Utilized During Treatment: Oxygen  Activity Tolerance: Patient tolerated treatment well Patient left: in bed;with call bell/phone within reach  OT Visit Diagnosis: Other abnormalities of gait and mobility (R26.89)                Time: [id number] OT Time Calculation (min): 14 min Charges:  OT General Charges $OT Visit: 1 Visit OT Evaluation $OT Eval Low Complexity: 1 Low OT Treatments $Self Care/Home Management : 8-22 mins  Dessie Coma, M.S. OTR/L  10/22/20, 10:17 AM  ascom [phone]

## 2020-10-22 NOTE — Progress Notes (Signed)
PT Cancellation Note  Patient Details Name: Shawn Jennings MRN: [id number] DOB: 22-Apr-1946   Cancelled Treatment:    Reason Eval/Treat Not Completed: Patient declined, no reason specified.  Upon arrival to room, pt was experiencing SOB and nursing was in room.  Nursing notified therapist that pt became scared when transferring back to bed from bedside commode.  Pt was safe in performing, however it scared him and he was having difficulty breathing although his SpO2 remained above 91%.  Pt requested that therapist come back at another date as he was not willing to participate in therapy today.  Therapist will re-attempt at later date/time as medically able to do so.   Nolon Bussing, PT, DPT 10/22/20, 2:53 PM

## 2020-10-23 DIAGNOSIS — K8309 Other cholangitis: Secondary | ICD-10-CM | POA: Diagnosis not present

## 2020-10-23 LAB — CBC WITH DIFFERENTIAL/PLATELET
Abs Immature Granulocytes: 0.15 10*3/uL — ABNORMAL HIGH (ref 0.00–0.07)
Basophils Absolute: 0.1 10*3/uL (ref 0.0–0.1)
Basophils Relative: 0 %
Eosinophils Absolute: 0.2 10*3/uL (ref 0.0–0.5)
Eosinophils Relative: 1 %
HCT: 29.9 % — ABNORMAL LOW (ref 39.0–52.0)
Hemoglobin: 9.9 g/dL — ABNORMAL LOW (ref 13.0–17.0)
Immature Granulocytes: 1 %
Lymphocytes Relative: 12 %
Lymphs Abs: 1.5 10*3/uL (ref 0.7–4.0)
MCH: 28 pg (ref 26.0–34.0)
MCHC: 33.1 g/dL (ref 30.0–36.0)
MCV: 84.5 fL (ref 80.0–100.0)
Monocytes Absolute: 1.4 10*3/uL — ABNORMAL HIGH (ref 0.1–1.0)
Monocytes Relative: 11 %
Neutro Abs: 9.3 10*3/uL — ABNORMAL HIGH (ref 1.7–7.7)
Neutrophils Relative %: 75 %
Platelets: 215 10*3/uL (ref 150–400)
RBC: 3.54 MIL/uL — ABNORMAL LOW (ref 4.22–5.81)
RDW: 14.9 % (ref 11.5–15.5)
WBC: 12.6 10*3/uL — ABNORMAL HIGH (ref 4.0–10.5)
nRBC: 0 % (ref 0.0–0.2)

## 2020-10-23 LAB — COMPREHENSIVE METABOLIC PANEL
ALT: 106 U/L — ABNORMAL HIGH (ref 0–44)
ALT: 90 U/L — ABNORMAL HIGH (ref 0–44)
AST: 28 U/L (ref 15–41)
AST: 24 U/L (ref 15–41)
Albumin: 2.6 g/dL — ABNORMAL LOW (ref 3.5–5.0)
Albumin: 2.3 g/dL — ABNORMAL LOW (ref 3.5–5.0)
Alkaline Phosphatase: 192 U/L — ABNORMAL HIGH (ref 38–126)
Alkaline Phosphatase: 188 U/L — ABNORMAL HIGH (ref 38–126)
Anion gap: 11 (ref 5–15)
Anion gap: 8 (ref 5–15)
BUN: 20 mg/dL (ref 8–23)
BUN: 20 mg/dL (ref 8–23)
CO2: 20 mmol/L — ABNORMAL LOW (ref 22–32)
CO2: 21 mmol/L — ABNORMAL LOW (ref 22–32)
Calcium: 8.4 mg/dL — ABNORMAL LOW (ref 8.9–10.3)
Calcium: 8.3 mg/dL — ABNORMAL LOW (ref 8.9–10.3)
Chloride: 110 mmol/L (ref 98–111)
Chloride: 109 mmol/L (ref 98–111)
Creatinine, Ser: 1.62 mg/dL — ABNORMAL HIGH (ref 0.61–1.24)
Creatinine, Ser: 1.68 mg/dL — ABNORMAL HIGH (ref 0.61–1.24)
GFR, Estimated: 44 mL/min — ABNORMAL LOW (ref >60)
GFR, Estimated: 42 mL/min — ABNORMAL LOW (ref >60)
Glucose, Bld: 106 mg/dL — ABNORMAL HIGH (ref 70–99)
Glucose, Bld: 223 mg/dL — ABNORMAL HIGH (ref 70–99)
Potassium: 4.3 mmol/L (ref 3.5–5.1)
Potassium: 4.5 mmol/L (ref 3.5–5.1)
Sodium: 141 mmol/L (ref 135–145)
Sodium: 138 mmol/L (ref 135–145)
Total Bilirubin: 1.2 mg/dL (ref 0.3–1.2)
Total Bilirubin: 1.1 mg/dL (ref 0.3–1.2)
Total Protein: 5.8 g/dL — ABNORMAL LOW (ref 6.5–8.1)
Total Protein: 5.7 g/dL — ABNORMAL LOW (ref 6.5–8.1)

## 2020-10-23 LAB — CULTURE, BLOOD (ROUTINE X 2)
Culture: NO GROWTH
Culture: NO GROWTH
Special Requests: ADEQUATE
Special Requests: ADEQUATE

## 2020-10-23 LAB — GLUCOSE, CAPILLARY
Glucose-Capillary: 109 mg/dL — ABNORMAL HIGH (ref 70–99)
Glucose-Capillary: 137 mg/dL — ABNORMAL HIGH (ref 70–99)
Glucose-Capillary: 175 mg/dL — ABNORMAL HIGH (ref 70–99)
Glucose-Capillary: 232 mg/dL — ABNORMAL HIGH (ref 70–99)
Glucose-Capillary: 234 mg/dL — ABNORMAL HIGH (ref 70–99)

## 2020-10-23 LAB — LIPASE, BLOOD: Lipase: 289 U/L — ABNORMAL HIGH (ref 11–51)

## 2020-10-23 MED ORDER — SODIUM BICARBONATE 650 MG PO TABS
650.0000 mg | ORAL_TABLET | Freq: Two times a day (BID) | ORAL | Status: DC
Start: 2020-10-23 — End: 2020-10-25
  Administered 2020-10-23 – 2020-10-25 (×5): 650 mg via ORAL
  Filled 2020-10-23 (×6): qty 1

## 2020-10-23 NOTE — Evaluation (Signed)
Physical Therapy Evaluation Patient Details Name: Shawn Jennings MRN: [id number] DOB: 02-18-1946 Today's Date: 10/23/2020   History of Present Illness  75 y.o male with PMH significant for CAD s/p CABG, Ischemic Cardiomyopathy, HFrEF, ASA s/p AVR, CKD stage lll, GERD, COPD,HLD, HTN, PAD s/p L AKA (2016), PAF on Xarelto?, Thyrotoxicosis, Tobacco abuse and Type ll DM admitted with choledocholithiasis and AKI now found to be septic.  Clinical Impression  Pt requires encouragement & reluctantly participates in PT evaluation. Pt received in bed with nasal cannula only in 1 nostril & pt requiring assistance to properly position it. Pt is able to complete lateral scoot transfer bed>drop arm recliner with min guard assist and extra time with only min cuing for hand placement. Pt requires encouragement to sit up in recliner as pt initially asking to return to bed. PT educates pt on importance of OOB mobility but pt states "I don't feel weak" but agreeable to sitting in recliner until finished with breakfast. Pt would benefit from continued skilled PT treatment to increase independence with bed mobility & transfers.     Follow Up Recommendations Home health PT;Supervision for mobility/OOB    Equipment Recommendations  None recommended by PT    Recommendations for Other Services       Precautions / Restrictions Precautions Precautions: Fall Precaution Comments: (Old L AKA) Restrictions Weight Bearing Restrictions: No      Mobility  Bed Mobility Overal bed mobility: Needs Assistance Bed Mobility: Supine to Sit     Supine to sit: Supervision;HOB elevated (extra time & encouragement to complete movement, pt states "You didn't help me". Cuing to square up to EOB.)          Transfers Overall transfer level: Needs assistance   Transfers: Lateral/Scoot Transfers          Lateral/Scoot Transfers: Min guard General transfer comment: min cuing for hand placement, extra time to complete  scoot  Ambulation/Gait                Stairs            Wheelchair Mobility    Modified Rankin (Stroke Patients Only)       Balance Overall balance assessment: Mild deficits observed, not formally tested;Needs assistance Sitting-balance support: Bilateral upper extremity supported (1LE supported) Sitting balance-Leahy Scale: Fair Sitting balance - Comments: min guard/close supervision during lateral scoot bed>drop arm recliner                                     Pertinent Vitals/Pain Pain Assessment:  (pt reports unrated "chest pain" but upon further discussion pt notes it is more "shortness of breath" - nurse made aware of pt's c/o and pt's request for "something for his asthma", monitored c/o during session)    Home Living Family/patient expects to be discharged to:: Assisted living               Home Equipment: Shower seat;Wheelchair - manual      Prior Function Level of Independence: Needs assistance;Independent with assistive device(s)         Comments: Reports he is from [employer] on 4L  baseline. Reports requires assistance for bathing and IADLs. MOD I w/c transfers     Hand Dominance        Extremity/Trunk Assessment   Upper Extremity Assessment Upper Extremity Assessment: Generalized weakness    Lower Extremity Assessment Lower Extremity Assessment: Generalized weakness  LLE Deficits / Details: L AKA       Communication   Communication: No difficulties  Cognition Arousal/Alertness: Awake/alert Behavior During Therapy: WFL for tasks assessed/performed Overall Cognitive Status: Within Functional Limits for tasks assessed                                 General Comments: AxO to self, location (with questioning cuing as pt initially reports he's at [facility]), year (not month or date), and situation; pt requires encouragement to participate & reluctantly does so. Pt with impaired  awareness as he reports "I don't feel weak" after PT encourages him to sit up in recliner vs lying in bed.      General Comments General comments (skin integrity, edema, etc.): SpO2 greater than or = 91% on 4L/min via nasal cannula throughout session, HR = 70 bpm lying in bed, HR = 91 bpm after transferring to recliner    Exercises     Assessment/Plan    PT Assessment Patient needs continued PT services  PT Problem List Decreased strength;Decreased balance;Decreased cognition;Cardiopulmonary status limiting activity;Decreased knowledge of use of DME;Decreased mobility;Decreased activity tolerance;Decreased safety awareness       PT Treatment Interventions DME instruction;Functional mobility training;Balance training;Patient/family education;Wheelchair mobility training;Neuromuscular re-education;Therapeutic activities;Gait training;Stair training;Therapeutic exercise;Manual techniques;Cognitive remediation    PT Goals (Current goals can be found in the Care Plan section)  Acute Rehab PT Goals Patient Stated Goal: To return to PLOF PT Goal Formulation: With patient Time For Goal Achievement: 11/06/20 Potential to Achieve Goals: Good Additional Goals Additional Goal #1: Pt will propel w/c 150 ft with mod I.    Frequency Min 2X/week   Barriers to discharge        Co-evaluation               AM-PAC PT "6 Clicks" Mobility  Outcome Measure Help needed turning from your back to your side while in a flat bed without using bedrails?: A Little Help needed moving from lying on your back to sitting on the side of a flat bed without using bedrails?: A Little Help needed moving to and from a bed to a chair (including a wheelchair)?: A Little Help needed standing up from a chair using your arms (e.g., wheelchair or bedside chair)?: A Lot Help needed to walk in hospital room?: Total Help needed climbing 3-5 steps with a railing? : Total 6 Click Score: 13    End of Session  Equipment Utilized During Treatment: Oxygen Activity Tolerance: Patient tolerated treatment well Patient left: in chair;with call bell/phone within reach;with chair alarm set;with nursing/sitter in room Nurse Communication: Mobility status PT Visit Diagnosis: Muscle weakness (generalized) (M62.81);Difficulty in walking, not elsewhere classified (R26.2)    Time: 8185-6314 PT Time Calculation (min) (ACUTE ONLY): 14 min   Charges:   PT Evaluation $PT Eval Low Complexity: 1 Low PT Treatments $Therapeutic Activity: 8-22 mins        Aleda Grana, PT, DPT 10/23/20, 10:36 AM   Shawn Jennings 10/23/2020, 10:34 AM

## 2020-10-23 NOTE — Progress Notes (Signed)
Patient oxygen saturation is 92% on 2 Liters of oxygen while working with the Clinical research associate. Patient states he is extremely SOB and wants the oxygen "bumped" up.

## 2020-10-23 NOTE — Progress Notes (Signed)
Patient is insisting that he is having an "asthma attack" O2 sats are at 99% on 10 Liters. Patient was given an dose of ativan for anxiety.

## 2020-10-23 NOTE — Progress Notes (Signed)
Nutrition Follow-up  DOCUMENTATION CODES:   Severe malnutrition in context of chronic illness  INTERVENTION:  Continue Ensure Enlive po TID, each supplement provides 350 kcal and 20 grams of protein   NUTRITION DIAGNOSIS:   Severe Malnutrition related to chronic illness (COPD) as evidenced by severe fat depletion,severe muscle depletion. -ongoing  GOAL:   Patient will meet greater than or equal to 90% of their needs -progressing, will add ONS   MONITOR:   Diet advancement,Labs,Weight trends,Skin,I & O's  REASON FOR ASSESSMENT:   Consult Assessment of nutrition requirement/status  ASSESSMENT:   75 y.o. male with a known history of coronary artery disease status post CABG, COPD, depression, hypertension, GERD, dyslipidemia and type 2 diabetes mellitus who presented to the emergency room with occurrences of substernal chest pain and found to have choledocholithiasis  RD working remotely.  RD consulted for assessment of nutrition requirement/status. Patient identified on malnutrition screening tool on admission, nutrition department is following. Please see RD note on 12/28 for full assessment.  RD ordered Ensure Enlive TID on 12/30 with diet advancement. This provides an additional 1050 kcal and 60 grams of protein daily meets 56% of minimum kcal needs and 63% of minimum protein needs. Diet advanced to dysphagia 3 on 1/01, per flowsheets, he consumed 100% of breakfast tray. Will continue to monitor po intake of meals/supplements and will order additional supplements as appropriate.   Medications reviewed and include: Colace, Ferrous sulfate, SSI, Levemir 20 units at bedtime, Melatonin, Protonix, Senokot, Sodium bicarbonate  Labs: CBGs 109,187,212,185, Cr 1.62 (H), Lipase 289 (H) trending down, ALT 106 (H) trending down, WBC 12.6 (H) trending down K/Mg - WNL  10/18/20 NUTRITION - FOCUSED PHYSICAL EXAM:  Flowsheet Row Most Recent Value  Orbital Region Severe depletion  Upper  Arm Region Severe depletion  Thoracic and Lumbar Region Severe depletion  Buccal Region Severe depletion  Temple Region Severe depletion  Clavicle Bone Region Severe depletion  Clavicle and Acromion Bone Region Severe depletion  Scapular Bone Region Severe depletion  Dorsal Hand Severe depletion  Patellar Region Severe depletion  Anterior Thigh Region Severe depletion  Posterior Calf Region Severe depletion  Edema (RD Assessment) None  Hair Reviewed  Eyes Reviewed  Mouth Reviewed  Skin Reviewed  Nails Reviewed       Diet Order:   Diet Order            DIET DYS 3 Room service appropriate? Yes with Assist; Fluid consistency: Thin  Diet effective now                 EDUCATION NEEDS:   Education needs have been addressed  Skin:  Skin Assessment: Reviewed RN Assessment (ecchymosis, jaundice)  Last BM:  1/01 type 5 (brown;small)  Height:   Ht Readings from Last 1 Encounters:  10/21/20 6' (1.829 m)    Weight:   Wt Readings from Last 1 Encounters:  10/23/20 69.5 kg    Ideal Body Weight:  80.9 kg  BMI:  Body mass index is 20.78 kg/m.  Estimated Nutritional Needs:   Kcal:  1900-2200kcal/day  Protein:  95-110g/day  Fluid:  1.7-2.0L/day   Lars Masson, RD, LDN Clinical Nutrition After Hours/Weekend Pager # in Amion

## 2020-10-23 NOTE — TOC Progression Note (Addendum)
Transition of Care [facility]) - Progression Note    Patient Details  Name: Shawn Jennings MRN: [id number] Date of Birth: 10/19/1946  [facility] CM/SW Contact  Bing Quarry, RN Phone Number: 10/23/2020, 2:17 PM  Clinical Narrative:    10/23/20 RN added to [facility] services. Feliberto Gottron at [facility] accepted patient 08/23/21.  10/23/20 Provider notes indicate plan to dc tomorrow, 10/24/20, back to [place] [facility] with [facility]. Still on O2 4L/[place]. Oxygen saturation test will be done for any dc DME oxygen needs. Will try to contact AALF this afternoon regarding dc tomorrow. Gabriel Cirri RN CM         Expected Discharge Plan and Services                                                 Social Determinants of Health (SDOH) Interventions    Readmission Risk Interventions No flowsheet data found.

## 2020-10-23 NOTE — Progress Notes (Signed)
PROGRESS NOTE    Shawn Jennings  [id number] DOB: 08-Jan-1946 DOA: 10/17/2020 PCP: Orvis Brill, [facility]   Chief Complain:  Brief Narrative: 75 year old male from ALF with history of paroxysmal A. fib who was initially admitted under [facility] service had to be moved to ICU after he developed septic shock from ascending cholangitis.  Underwent ERCP with a stent placement.  Hospital course remarkable for requirement of  pressors. He was transferred to our service on 10/21/2020.  Hospital course also remarkable for A. fib with RVR, AKI on CKD stage III.  Overall condition is improving and is nearing to discharge planning.  PT/OT recommending home health on discharge.  Discharge planning tomorrow.  Assessment & Plan:   Active Problems:   Choledocholithiasis   Protein-calorie malnutrition, severe   Cholangitis  Acute on chronic hypoxic respiratory failure:  Most likely this is from COPD exacerbation.  Currently respiratory status stable. He still smokes.  Continue to wean the oxygen.  He is not on oxygen at home.  Continue bronchodilators.  Currently requiring 3 to 4 L of oxygen per minute.  We will qualify for home oxygen if needed.  Septic shock secondary to ascending cholangitis: Needed vasopressor support after hospitalization and had to be transferred to ICU.  Currently blood pressure stable without vasopressors.  Cultures negative so far.  He was on Zosyn and zyvox.  GI recommended to follow-up as an outpatient for ERCP in 2 months to remove the stent.  Blood pressure was soft so started on midodrine.  Now stable.  Changed antibiotics to Augmentin, to be continued for total of 2 weeks course  Ascending cholangitis : Found to have 6X 5 mm stone in the distal common bile duct.  GI was consulted and he underwent ERCP with stone extraction.  Continue Zosyn.  Follow-up cultures.  Abx chnaged to oral  Elevated troponin: Most likely secondary to supply demand ischemia.  Denies any chest  pain.  Ischemic cardiomyopathy: Currently euvolumic.  Echo on 04/2020 showed ejection fraction of 25 to 30%, bioprosthetic aortic valve, aortic stenosis.  He has history of CABG in 1994.  Continue home medications.  He is on aspirin, Lipitor, carvedilol, Imdur, Entresto.Echo during this hospitalization showed ejection fraction of [id number], normal diastolic parameters.  A. fib with RVR: History of paroxysmal A. fib.  Currently in sinus rhythm.  On went into RVR during his hospitalization.  On xarelto.Given a dose of IV amiodarone.  Currently heart rate is stable .  Continue Coreg that he takes at home.  Monitor on telemetry.  AKI on CKD stage IIIa: Currently kidney function at baseline.  He was given some IV fluids which has been stopped now.  Baseline creatinine in the range of 1.4-1.6  Non-anion gap metabolic acidosis: Associated CKD.  Started on bicarb tablets  Normocytic anemia: Monitor hemoglobin which is currently stable.  Transfuse if less than 7.  Diabetes mellitus type 2: Monitor CBGs.  Continue sliding scale insulin and Lantus.  Takes Metformin at home.HbA1C of .  Depression: Continue home antidepressant  Hypokalemia: Supplemented and corrected.  Hyperlipidemia: On Lipitor  Tobacco abuse: Counseled for cessation.  Continue nicotine patch  Debility/deconditioning: Status post left AKA.  ALF resident.PT/Ot evaluation done,recommended HH     Nutrition Problem: Severe Malnutrition Etiology: chronic illness (COPD)      DVT prophylaxis:Xarelto Code Status: Full Family Communication: None at bedside Status is: Inpatient    Dispo: The patient is from: ALF  Anticipated d/c is to:ALF              Anticipated d/c date is: 1              Patient currently is not medically stable to d/c.  Still requiring 4 L of oxygen per minute.  We will check is he qualifies for oxygen before discharge   Consultants: PCCM  Procedures:ERCP  Antimicrobials:  Anti-infectives  (From admission, onward)   Start     Dose/Rate Route Frequency Ordered Stop   10/22/20 1000  amoxicillin-clavulanate (AUGMENTIN) 875-125 MG per tablet 1 tablet        1 tablet Oral Every 12 hours 10/22/20 0833     10/21/20 1000  linezolid (ZYVOX) tablet 600 mg  Status:  Discontinued        600 mg Oral Every 12 hours 10/21/20 0849 10/22/20 0833   10/19/20 1000  vancomycin (VANCOCIN) IVPB 1000 mg/200 mL premix  Status:  Discontinued       "Followed by" Linked Group Details   1,000 mg 200 mL/hr over 60 Minutes Intravenous Every 24 hours 10/18/20 0335 10/19/20 1132   10/18/20 0500  piperacillin-tazobactam (ZOSYN) IVPB 3.375 g  Status:  Discontinued        3.375 g 12.5 mL/hr over 240 Minutes Intravenous Every 8 hours 10/18/20 0338 10/22/20 0833   10/18/20 0430  vancomycin (VANCOREADY) IVPB 1500 mg/300 mL       "Followed by" Linked Group Details   1,500 mg 150 mL/hr over 120 Minutes Intravenous  Once 10/18/20 0335 10/18/20 0536   10/18/20 0415  metroNIDAZOLE (FLAGYL) IVPB 500 mg  Status:  Discontinued        500 mg 100 mL/hr over 60 Minutes Intravenous Every 8 hours 10/18/20 0326 10/18/20 0332   10/18/20 0415  vancomycin (VANCOCIN) IVPB 1000 mg/200 mL premix  Status:  Discontinued        1,000 mg 200 mL/hr over 60 Minutes Intravenous  Once 10/18/20 0326 10/18/20 0331   10/17/20 2100  piperacillin-tazobactam (ZOSYN) IVPB 3.375 g        3.375 g 100 mL/hr over 30 Minutes Intravenous  Once 10/17/20 2056 10/17/20 2229      Subjective:  Patient seen and examined the bedside this morning.  Hemodynamically stable sitting in the chair.  Denies any complaints.  Still on 4 L of oxygen per minute.    Objective: Vitals:   10/22/20 2113 10/23/20 0203 10/23/20 0424 10/23/20 0725  BP: (!) 144/86  132/73 138/77  Pulse: 73  69 69  Resp: 16  17 18   Temp: 98.2 F (36.8 C)  97.9 F (36.6 C) 98 F (36.7 C)  TempSrc: Oral  Oral   SpO2: 97% (!) 4% 95% 97%  Weight:   69.5 kg   Height:         Intake/Output Summary (Last 24 hours) at 10/23/2020 0845 Last data filed at 10/22/2020 2114 Gross per 24 hour  Intake 720 ml  Output 700 ml  Net 20 ml   Filed Weights   10/22/20 0318 10/22/20 0445 10/23/20 0424  Weight: 70.7 kg (P) 70.7 kg 69.5 kg    Examination:   General exam: Deconditioned, debilitated, chronically looking HEENT:PERRL,Oral mucosa moist, Ear/Nose normal on gross exam Respiratory system: Bilateral mildly diminished breath sounds, normal vesicular breath sounds, no wheezes or crackles  Cardiovascular system: S1 & S2 heard, RRR. No JVD, murmurs, rubs, gallops or clicks. Gastrointestinal system: Abdomen is nondistended, soft and nontender. No organomegaly or masses felt.  Normal bowel sounds heard. Central nervous system: Alert and oriented. No focal neurological deficits. Extremities: No edema, no clubbing ,no cyanosis, left AKA  skin: No rashes, lesions or ulcers,no icterus ,no pallor   Data Reviewed: I have personally reviewed following labs and imaging studies  CBC: Recent Labs  Lab 10/19/20 0633 10/20/20 0424 10/21/20 0429 10/22/20 0415 10/23/20 0448  WBC 12.3* 5.7 10.3 12.8* 12.6*  NEUTROABS 10.4* 5.0 8.6* 10.7* 9.3*  HGB 10.1* 9.4* 9.3* 9.2* 9.9*  HCT 30.4* 28.9* 27.8* 27.8* 29.9*  MCV 85.6 87.0 84.2 85.3 84.5  PLT 184 163 170 174 215   Basic Metabolic Panel: Recent Labs  Lab 10/18/20 0203 10/18/20 0453 10/18/20 2017 10/19/20 0633 10/20/20 0424 10/21/20 0429 10/22/20 0415 10/23/20 0448  NA 142   < >  --  139 138 138 138 141  K 3.1*   < >  --  3.5 3.8 3.2* 4.4 4.3  CL 107   < >  --  108 109 107 108 110  CO2 22   < >  --  20* 19* 21* 20* 20*  GLUCOSE 178*   < >  --  138* 241* 227* 228* 106*  BUN 29*   < >  --  27* 34* 30* 26* 20  CREATININE 1.71*   < >  --  1.70* 1.95* 1.77* 1.73* 1.62*  CALCIUM 7.2*   < >  --  7.1* 7.1* 7.4* 8.0* 8.4*  MG 0.8*  --  3.0* 2.5*  --   --  2.0  --   PHOS 3.0  --   --   --   --   --   --   --    < > =  values in this interval not displayed.   GFR: Estimated Creatinine Clearance: 38.1 mL/min (A) (by C-G formula based on SCr of 1.62 mg/dL (H)). Liver Function Tests: Recent Labs  Lab 10/19/20 0633 10/20/20 0424 10/21/20 0429 10/22/20 0415 10/23/20 0448  AST 135* 70* 38 27 28  ALT 275* 206* 158* 124* 106*  ALKPHOS 288* 276* 241* 220* 192*  BILITOT 3.6* 2.0* 1.2 1.1 1.2  PROT 5.3* 5.3* 5.4* 5.4* 5.8*  ALBUMIN 2.5* 2.4* 2.5* 2.4* 2.6*   Recent Labs  Lab 10/17/20 1846 10/18/20 0212 10/20/20 2225 10/22/20 0415 10/23/20 0448  LIPASE 79* 1,097* 504* 354* 289*  AMYLASE  --   --   --  218*  --    No results for input(s): AMMONIA in the last 168 hours. Coagulation Profile: Recent Labs  Lab 10/18/20 0212 10/19/20 1459  INR 1.5* 1.2   Cardiac Enzymes: No results for input(s): CKTOTAL, CKMB, CKMBINDEX, TROPONINI in the last 168 hours. BNP (last 3 results) No results for input(s): PROBNP in the last 8760 hours. HbA1C: No results for input(s): HGBA1C in the last 72 hours. CBG: Recent Labs  Lab 10/22/20 0809 10/22/20 1219 10/22/20 1601 10/22/20 2115 10/23/20 0725  GLUCAP 130* 185* 212* 187* 109*   Lipid Profile: No results for input(s): CHOL, HDL, LDLCALC, TRIG, CHOLHDL, LDLDIRECT in the last 72 hours. Thyroid Function Tests: No results for input(s): TSH, T4TOTAL, FREET4, T3FREE, THYROIDAB in the last 72 hours. Anemia Panel: No results for input(s): VITAMINB12, FOLATE, FERRITIN, TIBC, IRON, RETICCTPCT in the last 72 hours. Sepsis Labs: Recent Labs  Lab 10/18/20 0203 10/18/20 0453 10/18/20 0744 10/19/20 0633 10/20/20 0424 10/20/20 0954  PROCALCITON  --  20.65  --  7.71 3.61  --   LATICACIDVEN 3.1*  --  2.3*  --   --  2.4*    Recent Results (from the past 240 hour(s))  Resp Panel by RT-PCR (Flu A&B, Covid) Nasopharyngeal Swab     Status: None   Collection Time: 10/17/20  9:19 PM   Specimen: Nasopharyngeal Swab; Nasopharyngeal(NP) swabs in vial transport medium   Result Value Ref Range Status   SARS Coronavirus 2 by RT PCR NEGATIVE NEGATIVE Final    Comment: (NOTE) SARS-CoV-2 target nucleic acids are NOT DETECTED.  The SARS-CoV-2 RNA is generally detectable in upper respiratory specimens during the acute phase of infection. The lowest concentration of SARS-CoV-2 viral copies this assay can detect is 138 copies/mL. A negative result does not preclude SARS-Cov-2 infection and should not be used as the sole basis for treatment or other patient management decisions. A negative result may occur with  improper specimen collection/handling, submission of specimen other than nasopharyngeal swab, presence of viral mutation(s) within the areas targeted by this assay, and inadequate number of viral copies(<138 copies/mL). A negative result must be combined with clinical observations, patient history, and epidemiological information. The expected result is Negative.  Fact Sheet for Patients:  EntrepreneurPulse.com.au  Fact Sheet for Healthcare Providers:  IncredibleEmployment.be  This test is no t yet approved or cleared by the [place] FDA and  has been authorized for detection and/or diagnosis of SARS-CoV-2 by FDA under an Emergency Use Authorization (EUA). This EUA will remain  in effect (meaning this test can be used) for the duration of the COVID-19 declaration under Section 564(b)(1) of the Act, 21 U.S.C.section 360bbb-3(b)(1), unless the authorization is terminated  or revoked sooner.       Influenza A by PCR NEGATIVE NEGATIVE Final   Influenza B by PCR NEGATIVE NEGATIVE Final    Comment: (NOTE) The Xpert Xpress SARS-CoV-2/FLU/RSV plus assay is intended as an aid in the diagnosis of influenza from Nasopharyngeal swab specimens and should not be used as a sole basis for treatment. Nasal washings and aspirates are unacceptable for Xpert Xpress SARS-CoV-2/FLU/RSV testing.  Fact Sheet for  Patients: EntrepreneurPulse.com.au  Fact Sheet for Healthcare Providers: IncredibleEmployment.be  This test is not yet approved or cleared by the [place] FDA and has been authorized for detection and/or diagnosis of SARS-CoV-2 by FDA under an Emergency Use Authorization (EUA). This EUA will remain in effect (meaning this test can be used) for the duration of the COVID-19 declaration under Section 564(b)(1) of the Act, 21 U.S.C. section 360bbb-3(b)(1), unless the authorization is terminated or revoked.  Performed at [facility], [address] [place], [place] [address]   MRSA PCR Screening     Status: Abnormal   Collection Time: 10/18/20 12:17 AM   Specimen: Nasal Mucosa; Nasopharyngeal  Result Value Ref Range Status   MRSA by PCR POSITIVE (A) NEGATIVE Final    Comment:        The GeneXpert MRSA Assay (FDA approved for NASAL specimens only), is one component of a comprehensive MRSA colonization surveillance program. It is not intended to diagnose MRSA infection nor to guide or monitor treatment for MRSA infections. RESULT CALLED TO, READ BACK BY AND VERIFIED WITH: MARSHA HATCH @0204  ON 10/18/20 SKL Performed at [facility], [address] [place], [place] [address]   Culture, blood (routine x 2)     Status: None   Collection Time: 10/18/20  4:41 AM   Specimen: BLOOD  Result Value Ref Range Status   Specimen Description BLOOD LEFT WRIST  Final   Special  Requests   Final    BOTTLES DRAWN AEROBIC AND ANAEROBIC Blood Culture adequate volume   Culture   Final    NO GROWTH 5 DAYS Performed at [facility], [address] [place], [place] [address]    Report Status 10/23/2020 FINAL  Final  Culture, blood (routine x 2)     Status: None   Collection Time: 10/18/20  4:53 AM   Specimen: BLOOD  Result Value Ref Range Status   Specimen Description BLOOD LEFT ASSIST CONTROL  Final   Special Requests    Final    BOTTLES DRAWN AEROBIC AND ANAEROBIC Blood Culture adequate volume   Culture   Final    NO GROWTH 5 DAYS Performed at [facility], [address] [place], [place] [address]    Report Status 10/23/2020 FINAL  Final  Urine Culture     Status: None   Collection Time: 10/18/20  9:02 AM   Specimen: Urine, Random  Result Value Ref Range Status   Specimen Description   Final    URINE, RANDOM Performed at [facility], [address] [place], [place] [address]    Special Requests   Final    NONE Performed at [facility], [address], [place], [place] [address]    Culture   Final    NO GROWTH Performed at [facility], [place] [address], [place], [place] [address]    Report Status 10/19/2020 FINAL  Final         Radiology Studies: No results found.      Scheduled Meds: . amoxicillin-clavulanate  1 tablet Oral Q12H  . atorvastatin  80 mg Oral QHS  . carvedilol  3.125 mg Oral BID  . Chlorhexidine Gluconate Cloth  6 each Topical Daily  . docusate sodium  100 mg Oral BID  . DULoxetine  30 mg Oral BID  . feeding supplement  237 mL Oral TID BM  . ferrous sulfate  325 mg Oral Daily  . insulin aspart  0-20 Units Subcutaneous TID WC  . insulin aspart  0-5 Units Subcutaneous QHS  . insulin detemir  20 Units Subcutaneous QHS  . lamoTRIgine  25 mg Oral BID  . melatonin  2.5 mg Oral QHS  . midodrine  5 mg Oral TID WC  . mometasone-formoterol  2 puff Inhalation BID  . multivitamin with minerals  1 tablet Oral Daily  . multivitamin-lutein  1 capsule Oral BID  . nicotine  14 mg Transdermal Daily  . pantoprazole (PROTONIX) IV  40 mg Intravenous Q24H  . Rivaroxaban  15 mg Oral Daily  . senna  2 tablet Oral QHS  . sodium bicarbonate  650 mg Oral BID  . tamsulosin  0.4 mg Oral Daily   Continuous Infusions: . sodium chloride Stopped (10/21/20 0558)     LOS: 6 days    Time spent: 35 mins.More than 50% of that time was spent in  counseling and/or coordination of care.      Shelly Coss, MD [facility] P1/11/2020, 8:45 AM

## 2020-10-23 NOTE — TOC Progression Note (Signed)
Transition of Care [facility]) - Progression Note    Patient Details  Name: Shawn Jennings MRN: [id number] Date of Birth: 1946/05/02  [facility] of [facility]) CM/SW Contact  Bing Quarry, RN Phone Number: 10/23/2020, 11:13 AM  Clinical Narrative:     Afib, ICU stay for sepsis.   Internal medicine 12/31 to follow.   Pt/OT evaluations recommend HH PT/OT, no DME recommended.   Speech evaluation and coaching for malnutrition, respiratory issues while eating.   From [facility].   TOC will monitor for further dc planning.   Gabriel Cirri RN CM        Expected Discharge Plan and Services                                                 Social Determinants of Health (SDOH) Interventions    Readmission Risk Interventions No flowsheet data found.

## 2020-10-23 NOTE — Progress Notes (Signed)
Mobility Specialist - Progress Note   10/23/20 1400  Mobility  Range of Motion/Exercises All extremities (AP, ABD, SLR, AR, CR, LR)  Level of Assistance Standby assist, set-up cues, supervision of patient - no hands on  Assistive Device None  Distance Ambulated (ft) 0 ft  Mobility Response Tolerated well  Mobility performed by Mobility specialist  $Mobility charge 1 Mobility    Pre-mobility: 70 HR, 98% SpO2 During mobility: 81 HR, 94% SpO2 Post-mobility: 72 HR, 95% SpO2   Pt was sleeping in bed upon arrival utilizing 10L [place] O2. Pt easily awakened by voice and agreed to session. Pt stated that he uses a w/c at baseline. Pt agreed to attempt to wean off O2 during session, mobility was given the okay to proceed by nurse. Pt presents with L AKA. Pt performed supine exercises: ankle pumps x10/R, SLR x10, hip abduction x10, arm reach x10, crossover reach x10, lateral reach x10. Pt's O2 was decreased to 8L, 6L, 4L, and 2L during activity with sats > 92% throughout entire session. Pt denied pain, nausea, or fatigue but did c/o SOB. Mobility increased O2 back to 8L after session (93%), but pt voiced he still felt SOB and asked if I could increase O2 again. Pt was left in bed with all needs in reach, utilizing 10L before exit. Pt states he feels comfortable utilizing 10L. Nurse notified of performance and pt's preference with O2 needs.    Shawn Jennings Mobility Specialist 10/23/20, 3:06 PM

## 2020-10-24 DIAGNOSIS — K8309 Other cholangitis: Secondary | ICD-10-CM | POA: Diagnosis not present

## 2020-10-24 LAB — GLUCOSE, CAPILLARY
Glucose-Capillary: 160 mg/dL — ABNORMAL HIGH (ref 70–99)
Glucose-Capillary: 162 mg/dL — ABNORMAL HIGH (ref 70–99)
Glucose-Capillary: 186 mg/dL — ABNORMAL HIGH (ref 70–99)
Glucose-Capillary: 232 mg/dL — ABNORMAL HIGH (ref 70–99)
Glucose-Capillary: 240 mg/dL — ABNORMAL HIGH (ref 70–99)
Glucose-Capillary: 259 mg/dL — ABNORMAL HIGH (ref 70–99)
Glucose-Capillary: 263 mg/dL — ABNORMAL HIGH (ref 70–99)

## 2020-10-24 LAB — CBC WITH DIFFERENTIAL/PLATELET
Abs Immature Granulocytes: 0.26 10*3/uL — ABNORMAL HIGH (ref 0.00–0.07)
Basophils Absolute: 0.1 10*3/uL (ref 0.0–0.1)
Basophils Relative: 1 %
Eosinophils Absolute: 0.5 10*3/uL (ref 0.0–0.5)
Eosinophils Relative: 4 %
HCT: 28.6 % — ABNORMAL LOW (ref 39.0–52.0)
Hemoglobin: 9.2 g/dL — ABNORMAL LOW (ref 13.0–17.0)
Immature Granulocytes: 2 %
Lymphocytes Relative: 11 %
Lymphs Abs: 1.4 10*3/uL (ref 0.7–4.0)
MCH: 28 pg (ref 26.0–34.0)
MCHC: 32.2 g/dL (ref 30.0–36.0)
MCV: 86.9 fL (ref 80.0–100.0)
Monocytes Absolute: 1.3 10*3/uL — ABNORMAL HIGH (ref 0.1–1.0)
Monocytes Relative: 10 %
Neutro Abs: 9.7 10*3/uL — ABNORMAL HIGH (ref 1.7–7.7)
Neutrophils Relative %: 72 %
Platelets: 207 10*3/uL (ref 150–400)
RBC: 3.29 MIL/uL — ABNORMAL LOW (ref 4.22–5.81)
RDW: 14.8 % (ref 11.5–15.5)
WBC: 13.2 10*3/uL — ABNORMAL HIGH (ref 4.0–10.5)
nRBC: 0 % (ref 0.0–0.2)

## 2020-10-24 MED ORDER — SODIUM CHLORIDE 0.9% FLUSH: 10.0000 mL | INTRAVENOUS | Status: DC | PRN

## 2020-10-24 MED ORDER — SODIUM CHLORIDE 0.9% FLUSH
10.0000 mL | Freq: Two times a day (BID) | INTRAVENOUS | Status: DC
  Administered 2020-10-24 – 2020-10-25 (×2): 10 mL

## 2020-10-24 MED ORDER — GUAIFENESIN ER 600 MG PO TB12
600.0000 mg | ORAL_TABLET | Freq: Two times a day (BID) | ORAL | Status: DC
  Administered 2020-10-24 – 2020-10-25 (×3): 600 mg via ORAL
  Filled 2020-10-24 (×3): qty 1

## 2020-10-24 MED ORDER — IPRATROPIUM-ALBUTEROL 0.5-2.5 (3) MG/3ML IN SOLN
3.0000 mL | Freq: Four times a day (QID) | RESPIRATORY_TRACT | Status: DC
  Administered 2020-10-24 – 2020-10-25 (×5): 3 mL via RESPIRATORY_TRACT
  Filled 2020-10-24 (×5): qty 3

## 2020-10-24 MED ORDER — METHYLPREDNISOLONE SODIUM SUCC 40 MG IJ SOLR
40.0000 mg | Freq: Two times a day (BID) | INTRAMUSCULAR | Status: DC
  Administered 2020-10-24 (×2): 40 mg via INTRAVENOUS
  Filled 2020-10-24 (×2): qty 1

## 2020-10-24 NOTE — Progress Notes (Signed)
PROGRESS NOTE    Shawn Jennings  [id number] DOB: 22-Jan-1946 DOA: 10/17/2020 PCP: Orvis Brill, [facility]   Chief Complain:  Brief Narrative: 75 year old male from ALF with history of paroxysmal A. fib who was initially admitted under [facility] service had to be moved to ICU after he developed septic shock from ascending cholangitis.  Underwent ERCP with a stent placement.  Hospital course remarkable for requirement of  pressors. He was transferred to our service on 10/21/2020.  Hospital course also remarkable for A. fib with RVR, AKI on CKD stage III.  Overall condition is improving and is nearing to discharge planning.  PT/OT recommending home health on discharge.  Discharge planning tomorrow.  He has severe wheezes today noted stable for discharge today.  Assessment & Plan:   Active Problems:   Choledocholithiasis   Protein-calorie malnutrition, severe   Cholangitis  Acute on chronic hypoxic respiratory failure:  Most likely this is from COPD exacerbation.  Currently respiratory status stable. He still smokes. He is not on oxygen at home.  Continue bronchodilators.  Currently requiring 2-3 L of oxygen per minute.  Qualified for home oxygen. Has wheezing today.  He started on DuoNeb and IV Solu-Medrol  Septic shock secondary to ascending cholangitis: Needed vasopressor support after hospitalization and had to be transferred to ICU.  Currently blood pressure stable without vasopressors.  Cultures negative so far.  He was on Zosyn and zyvox.  GI recommended to follow-up as an outpatient for ERCP in 2 months to remove the stent.  Blood pressure was soft so started on midodrine.  Now stable.  Changed antibiotics to Augmentin, to be continued for total of 2 weeks course  Ascending cholangitis : Found to have 6X 5 mm stone in the distal common bile duct.  GI was consulted and he underwent ERCP with stone extraction.  Continue Zosyn.  Follow-up cultures.  Abx chnaged to oral  Elevated troponin:  Most likely secondary to supply demand ischemia.  Denies any chest pain.  Ischemic cardiomyopathy: Currently euvolumic.  Echo on 04/2020 showed ejection fraction of 25 to 30%, bioprosthetic aortic valve, aortic stenosis.  He has history of CABG in 1994.  Continue home medications.  He is on aspirin, Lipitor, carvedilol, Imdur, Entresto.Echo during this hospitalization showed ejection fraction of [id number], normal diastolic parameters.  A. fib with RVR: History of paroxysmal A. fib.  Currently in sinus rhythm.  On went into RVR during his hospitalization.  On xarelto.Given a dose of IV amiodarone.  Currently heart rate is stable .  Continue Coreg that he takes at home. On xarelto for anticoagulation  AKI on CKD stage IIIa: Currently kidney function at baseline.  He was given some IV fluids which has been stopped now.  Baseline creatinine in the range of 1.4-1.6  Non-anion gap metabolic acidosis: Associated CKD.  Started on bicarb tablets  Normocytic anemia: Monitor hemoglobin which is currently stable.  Transfuse if less than 7.  Diabetes mellitus type 2: Monitor CBGs.  Continue sliding scale insulin and Lantus.  Takes Metformin at home.HbA1C of .  Depression: Continue home antidepressant  Hypokalemia: Supplemented and corrected.  Hyperlipidemia: On Lipitor  Tobacco abuse: Counseled for cessation.  Continue nicotine patch  Debility/deconditioning: Status post left AKA.  ALF resident.PT/Ot evaluation done,recommended HH     Nutrition Problem: Severe Malnutrition Etiology: chronic illness (COPD)      DVT prophylaxis:Xarelto Code Status: Full Family Communication: None at bedside Status is: Inpatient    Dispo: The patient is from: ALF  Anticipated d/c is to:ALF              Anticipated d/c date is: tomorrow              Patient currently is not medically stable to d/c.  Having severe wheezes today.  Plan for discharge tomorrow  Consultants:  PCCM  Procedures:ERCP  Antimicrobials:  Anti-infectives (From admission, onward)   Start     Dose/Rate Route Frequency Ordered Stop   10/22/20 1000  amoxicillin-clavulanate (AUGMENTIN) 875-125 MG per tablet 1 tablet        1 tablet Oral Every 12 hours 10/22/20 0833     10/21/20 1000  linezolid (ZYVOX) tablet 600 mg  Status:  Discontinued        600 mg Oral Every 12 hours 10/21/20 0849 10/22/20 0833   10/19/20 1000  vancomycin (VANCOCIN) IVPB 1000 mg/200 mL premix  Status:  Discontinued       "Followed by" Linked Group Details   1,000 mg 200 mL/hr over 60 Minutes Intravenous Every 24 hours 10/18/20 0335 10/19/20 1132   10/18/20 0500  piperacillin-tazobactam (ZOSYN) IVPB 3.375 g  Status:  Discontinued        3.375 g 12.5 mL/hr over 240 Minutes Intravenous Every 8 hours 10/18/20 0338 10/22/20 0833   10/18/20 0430  vancomycin (VANCOREADY) IVPB 1500 mg/300 mL       "Followed by" Linked Group Details   1,500 mg 150 mL/hr over 120 Minutes Intravenous  Once 10/18/20 0335 10/18/20 0536   10/18/20 0415  metroNIDAZOLE (FLAGYL) IVPB 500 mg  Status:  Discontinued        500 mg 100 mL/hr over 60 Minutes Intravenous Every 8 hours 10/18/20 0326 10/18/20 0332   10/18/20 0415  vancomycin (VANCOCIN) IVPB 1000 mg/200 mL premix  Status:  Discontinued        1,000 mg 200 mL/hr over 60 Minutes Intravenous  Once 10/18/20 0326 10/18/20 0331   10/17/20 2100  piperacillin-tazobactam (ZOSYN) IVPB 3.375 g        3.375 g 100 mL/hr over 30 Minutes Intravenous  Once 10/17/20 2056 10/17/20 2229      Subjective:  Patient seen and examined at bedside this morning.  When I entered the room, he was having nebulizing treatment.  He was complaining of severe shortness of breath and was wheezing.  Discharge canceled  Objective: Vitals:   10/24/20 0114 10/24/20 0219 10/24/20 0739 10/24/20 1129  BP:  (!) 143/79 (!) 150/93 132/85  Pulse:  77 85 79  Resp:  19 18 17   Temp:  98.2 F (36.8 C) 98.6 F (37 C) 98.4 F  (36.9 C)  TempSrc:      SpO2: 95% 97% 99% 97%  Weight:      Height:        Intake/Output Summary (Last 24 hours) at 10/24/2020 1330 Last data filed at 10/24/2020 0038 Gross per 24 hour  Intake --  Output 1275 ml  Net -1275 ml   Filed Weights   10/22/20 0318 10/22/20 0445 10/23/20 0424  Weight: 70.7 kg (P) 70.7 kg 69.5 kg    Examination:   General exam: Deconditioned, debilitated, chronically looking HEENT:PERRL,Oral mucosa moist, Ear/Nose normal on gross exam Respiratory system: Bilateral expiratory wheezes Cardiovascular system: S1 & S2 heard, RRR. No JVD, murmurs, rubs, gallops or clicks. Gastrointestinal system: Abdomen is nondistended, soft and nontender. No organomegaly or masses felt. Normal bowel sounds heard. Central nervous system: Alert and oriented. No focal neurological deficits. Extremities: No edema, no clubbing ,no  cyanosis, left AKA  skin: No rashes, lesions or ulcers,no icterus ,no pallor   Data Reviewed: I have personally reviewed following labs and imaging studies  CBC: Recent Labs  Lab 10/20/20 0424 10/21/20 0429 10/22/20 0415 10/23/20 0448 10/24/20 0445  WBC 5.7 10.3 12.8* 12.6* 13.2*  NEUTROABS 5.0 8.6* 10.7* 9.3* 9.7*  HGB 9.4* 9.3* 9.2* 9.9* 9.2*  HCT 28.9* 27.8* 27.8* 29.9* 28.6*  MCV 87.0 84.2 85.3 84.5 86.9  PLT 163 170 174 215 207   Basic Metabolic Panel: Recent Labs  Lab 10/18/20 0203 10/18/20 0453 10/18/20 2017 10/19/20 0633 10/20/20 0424 10/21/20 0429 10/22/20 0415 10/23/20 0448 10/23/20 1459  NA 142   < >  --  139 138 138 138 141 138  K 3.1*   < >  --  3.5 3.8 3.2* 4.4 4.3 4.5  CL 107   < >  --  108 109 107 108 110 109  CO2 22   < >  --  20* 19* 21* 20* 20* 21*  GLUCOSE 178*   < >  --  138* 241* 227* 228* 106* 223*  BUN 29*   < >  --  27* 34* 30* 26* 20 20  CREATININE 1.71*   < >  --  1.70* 1.95* 1.77* 1.73* 1.62* 1.68*  CALCIUM 7.2*   < >  --  7.1* 7.1* 7.4* 8.0* 8.4* 8.3*  MG 0.8*  --  3.0* 2.5*  --   --  2.0  --   --    PHOS 3.0  --   --   --   --   --   --   --   --    < > = values in this interval not displayed.   GFR: Estimated Creatinine Clearance: 36.8 mL/min (A) (by C-G formula based on SCr of 1.68 mg/dL (H)). Liver Function Tests: Recent Labs  Lab 10/20/20 0424 10/21/20 0429 10/22/20 0415 10/23/20 0448 10/23/20 1459  AST 70* 38 27 28 24   ALT 206* 158* 124* 106* 90*  ALKPHOS 276* 241* 220* 192* 188*  BILITOT 2.0* 1.2 1.1 1.2 1.1  PROT 5.3* 5.4* 5.4* 5.8* 5.7*  ALBUMIN 2.4* 2.5* 2.4* 2.6* 2.3*   Recent Labs  Lab 10/17/20 1846 10/18/20 0212 10/20/20 2225 10/22/20 0415 10/23/20 0448  LIPASE 79* 1,097* 504* 354* 289*  AMYLASE  --   --   --  218*  --    No results for input(s): AMMONIA in the last 168 hours. Coagulation Profile: Recent Labs  Lab 10/18/20 0212 10/19/20 1459  INR 1.5* 1.2   Cardiac Enzymes: No results for input(s): CKTOTAL, CKMB, CKMBINDEX, TROPONINI in the last 168 hours. BNP (last 3 results) No results for input(s): PROBNP in the last 8760 hours. HbA1C: No results for input(s): HGBA1C in the last 72 hours. CBG: Recent Labs  Lab 10/23/20 2019 10/23/20 2339 10/24/20 0337 10/24/20 0716 10/24/20 1128  GLUCAP 175* 232* 186* 232* 160*   Lipid Profile: No results for input(s): CHOL, HDL, LDLCALC, TRIG, CHOLHDL, LDLDIRECT in the last 72 hours. Thyroid Function Tests: No results for input(s): TSH, T4TOTAL, FREET4, T3FREE, THYROIDAB in the last 72 hours. Anemia Panel: No results for input(s): VITAMINB12, FOLATE, FERRITIN, TIBC, IRON, RETICCTPCT in the last 72 hours. Sepsis Labs: Recent Labs  Lab 10/18/20 0203 10/18/20 0453 10/18/20 0744 10/19/20 0633 10/20/20 0424 10/20/20 0954  PROCALCITON  --  20.65  --  7.71 3.61  --   LATICACIDVEN 3.1*  --  2.3*  --   --  2.4*    Recent Results (from the past 240 hour(s))  Resp Panel by RT-PCR (Flu A&B, Covid) Nasopharyngeal Swab     Status: None   Collection Time: 10/17/20  9:19 PM   Specimen: Nasopharyngeal  Swab; Nasopharyngeal(NP) swabs in vial transport medium  Result Value Ref Range Status   SARS Coronavirus 2 by RT PCR NEGATIVE NEGATIVE Final    Comment: (NOTE) SARS-CoV-2 target nucleic acids are NOT DETECTED.  The SARS-CoV-2 RNA is generally detectable in upper respiratory specimens during the acute phase of infection. The lowest concentration of SARS-CoV-2 viral copies this assay can detect is 138 copies/mL. A negative result does not preclude SARS-Cov-2 infection and should not be used as the sole basis for treatment or other patient management decisions. A negative result may occur with  improper specimen collection/handling, submission of specimen other than nasopharyngeal swab, presence of viral mutation(s) within the areas targeted by this assay, and inadequate number of viral copies(<138 copies/mL). A negative result must be combined with clinical observations, patient history, and epidemiological information. The expected result is Negative.  Fact Sheet for Patients:  EntrepreneurPulse.com.au  Fact Sheet for Healthcare Providers:  IncredibleEmployment.be  This test is no t yet approved or cleared by the [place] FDA and  has been authorized for detection and/or diagnosis of SARS-CoV-2 by FDA under an Emergency Use Authorization (EUA). This EUA will remain  in effect (meaning this test can be used) for the duration of the COVID-19 declaration under Section 564(b)(1) of the Act, 21 U.S.C.section 360bbb-3(b)(1), unless the authorization is terminated  or revoked sooner.       Influenza A by PCR NEGATIVE NEGATIVE Final   Influenza B by PCR NEGATIVE NEGATIVE Final    Comment: (NOTE) The Xpert Xpress SARS-CoV-2/FLU/RSV plus assay is intended as an aid in the diagnosis of influenza from Nasopharyngeal swab specimens and should not be used as a sole basis for treatment. Nasal washings and aspirates are unacceptable for Xpert Xpress  SARS-CoV-2/FLU/RSV testing.  Fact Sheet for Patients: EntrepreneurPulse.com.au  Fact Sheet for Healthcare Providers: IncredibleEmployment.be  This test is not yet approved or cleared by the [place] FDA and has been authorized for detection and/or diagnosis of SARS-CoV-2 by FDA under an Emergency Use Authorization (EUA). This EUA will remain in effect (meaning this test can be used) for the duration of the COVID-19 declaration under Section 564(b)(1) of the Act, 21 U.S.C. section 360bbb-3(b)(1), unless the authorization is terminated or revoked.  Performed at [facility], [address] [place], [place] [address]   MRSA PCR Screening     Status: Abnormal   Collection Time: 10/18/20 12:17 AM   Specimen: Nasal Mucosa; Nasopharyngeal  Result Value Ref Range Status   MRSA by PCR POSITIVE (A) NEGATIVE Final    Comment:        The GeneXpert MRSA Assay (FDA approved for NASAL specimens only), is one component of a comprehensive MRSA colonization surveillance program. It is not intended to diagnose MRSA infection nor to guide or monitor treatment for MRSA infections. RESULT CALLED TO, READ BACK BY AND VERIFIED WITH: MARSHA HATCH @0204  ON 10/18/20 SKL Performed at [facility], [address] [place], [place] [address]   Culture, blood (routine x 2)     Status: None   Collection Time: 10/18/20  4:41 AM   Specimen: BLOOD  Result Value Ref Range Status   Specimen Description BLOOD LEFT WRIST  Final   Special Requests   Final  BOTTLES DRAWN AEROBIC AND ANAEROBIC Blood Culture adequate volume   Culture   Final    NO GROWTH 5 DAYS Performed at [facility], [address] [place], [place] [address]    Report Status 10/23/2020 FINAL  Final  Culture, blood (routine x 2)     Status: None   Collection Time: 10/18/20  4:53 AM   Specimen: BLOOD  Result Value Ref Range Status   Specimen Description BLOOD  LEFT ASSIST CONTROL  Final   Special Requests   Final    BOTTLES DRAWN AEROBIC AND ANAEROBIC Blood Culture adequate volume   Culture   Final    NO GROWTH 5 DAYS Performed at [facility], [address] [place], [place] [address]    Report Status 10/23/2020 FINAL  Final  Urine Culture     Status: None   Collection Time: 10/18/20  9:02 AM   Specimen: Urine, Random  Result Value Ref Range Status   Specimen Description   Final    URINE, RANDOM Performed at [facility], [address], [place], [place] [address]    Special Requests   Final    NONE Performed at [facility], [address] [place], [place] [address]    Culture   Final    NO GROWTH Performed at [facility], [place] [address], [place], [place] [address]    Report Status 10/19/2020 FINAL  Final         Radiology Studies: No results found.      Scheduled Meds: . amoxicillin-clavulanate  1 tablet Oral Q12H  . atorvastatin  80 mg Oral QHS  . carvedilol  3.125 mg Oral BID  . Chlorhexidine Gluconate Cloth  6 each Topical Daily  . docusate sodium  100 mg Oral BID  . DULoxetine  30 mg Oral BID  . feeding supplement  237 mL Oral TID BM  . ferrous sulfate  325 mg Oral Daily  . guaiFENesin  600 mg Oral BID  . insulin aspart  0-20 Units Subcutaneous TID WC  . insulin aspart  0-5 Units Subcutaneous QHS  . insulin detemir  20 Units Subcutaneous QHS  . ipratropium-albuterol  3 mL Nebulization Q6H  . lamoTRIgine  25 mg Oral BID  . melatonin  2.5 mg Oral QHS  . methylPREDNISolone (SOLU-MEDROL) injection  40 mg Intravenous Q12H  . midodrine  5 mg Oral TID WC  . mometasone-formoterol  2 puff Inhalation BID  . multivitamin with minerals  1 tablet Oral Daily  . multivitamin-lutein  1 capsule Oral BID  . nicotine  14 mg Transdermal Daily  . pantoprazole (PROTONIX) IV  40 mg Intravenous Q24H  . Rivaroxaban  15 mg Oral Daily  . senna  2 tablet Oral QHS  . sodium bicarbonate  650 mg  Oral BID  . tamsulosin  0.4 mg Oral Daily   Continuous Infusions: . sodium chloride Stopped (10/21/20 0558)     LOS: 7 days    Time spent: 35 mins.More than 50% of that time was spent in counseling and/or coordination of care.      Shelly Coss, MD [facility] P1/12/2020, 1:30 PM

## 2020-10-24 NOTE — Care Management Important Message (Signed)
Important Message  Patient Details  Name: Shawn Jennings MRN: [id number] Date of Birth: Jun 23, 1946   Medicare Important Message Given:  Yes     Olegario Messier A Cristal Howatt 10/24/2020, 10:57 AM

## 2020-10-24 NOTE — TOC Progression Note (Addendum)
Transition of Care [facility]) - Progression Note    Patient Details  Name: Shawn Jennings MRN: [id number] Date of Birth: 10/16/1946  Transition of [facility] CM/SW Contact  Trenton Founds, RN Phone Number: 10/24/2020, 1:04 PM  Clinical Narrative:   RNCM reached out to facility to discuss patient discharge including orders for home health and possible oxygen. Facility representative will have nursing return call.   1:20pm Received return call from [place] nurse with [facility], she reports that she will need to come evaluate patient before he can return. She reports that she will try to make that visit this afternoon or possibly first thing in the morning. She verbalizes understanding of home oxygen need and reports that will need to be set up in facility before patient discharges as well.           Expected Discharge Plan and Services                                     HH Arranged: RN,OT,Speech Therapy,PT HH Agency: [facility] Date HH Agency Contacted: 10/23/20 Time HH Agency Contacted: 1534 Representative spoke with at [facility] Agency: Feliberto Gottron   Social Determinants of Health (SDOH) Interventions    Readmission Risk Interventions No flowsheet data found.

## 2020-10-24 NOTE — Progress Notes (Signed)
SATURATION QUALIFICATIONS: (This note is used to comply with regulatory documentation for home oxygen)  Patient Saturations on Room Air at Rest = 85  Patient Saturations on Room Air while Ambulating = N/A  Patient Saturations on 4 Liters of oxygen while Ambulating = 96  Please briefly explain why patient needs home oxygen: Patient desats while on room air. Complains of shortness of breath and expiratory wheezes.

## 2020-10-25 DIAGNOSIS — K8309 Other cholangitis: Secondary | ICD-10-CM | POA: Diagnosis not present

## 2020-10-25 LAB — CBC WITH DIFFERENTIAL/PLATELET
Abs Immature Granulocytes: 0.25 10*3/uL — ABNORMAL HIGH (ref 0.00–0.07)
Basophils Absolute: 0 10*3/uL (ref 0.0–0.1)
Basophils Relative: 0 %
Eosinophils Absolute: 0 10*3/uL (ref 0.0–0.5)
Eosinophils Relative: 0 %
HCT: 29.7 % — ABNORMAL LOW (ref 39.0–52.0)
Hemoglobin: 9.3 g/dL — ABNORMAL LOW (ref 13.0–17.0)
Immature Granulocytes: 2 %
Lymphocytes Relative: 6 %
Lymphs Abs: 0.8 10*3/uL (ref 0.7–4.0)
MCH: 27.8 pg (ref 26.0–34.0)
MCHC: 31.3 g/dL (ref 30.0–36.0)
MCV: 88.9 fL (ref 80.0–100.0)
Monocytes Absolute: 0.4 10*3/uL (ref 0.1–1.0)
Monocytes Relative: 3 %
Neutro Abs: 11.1 10*3/uL — ABNORMAL HIGH (ref 1.7–7.7)
Neutrophils Relative %: 89 %
Platelets: 219 10*3/uL (ref 150–400)
RBC: 3.34 MIL/uL — ABNORMAL LOW (ref 4.22–5.81)
RDW: 15.1 % (ref 11.5–15.5)
WBC: 12.5 10*3/uL — ABNORMAL HIGH (ref 4.0–10.5)
nRBC: 0 % (ref 0.0–0.2)

## 2020-10-25 LAB — GLUCOSE, CAPILLARY
Glucose-Capillary: 351 mg/dL — ABNORMAL HIGH (ref 70–99)
Glucose-Capillary: 298 mg/dL — ABNORMAL HIGH (ref 70–99)
Glucose-Capillary: 229 mg/dL — ABNORMAL HIGH (ref 70–99)

## 2020-10-25 MED ORDER — AMOXICILLIN-POT CLAVULANATE 875-125 MG PO TABS: 1.0000 | ORAL_TABLET | Freq: Two times a day (BID) | ORAL | 0 refills | Status: AC

## 2020-10-25 MED ORDER — INSULIN ASPART 100 UNIT/ML ~~LOC~~ SOLN
8.0000 [IU] | Freq: Once | SUBCUTANEOUS | Status: AC
  Administered 2020-10-25: 8 [IU] via SUBCUTANEOUS
  Filled 2020-10-25: qty 1

## 2020-10-25 MED ORDER — ALBUTEROL SULFATE (2.5 MG/3ML) 0.083% IN NEBU
2.5000 mg | INHALATION_SOLUTION | Freq: Once | RESPIRATORY_TRACT | Status: AC
  Administered 2020-10-25: 2.5 mg via RESPIRATORY_TRACT
  Filled 2020-10-25: qty 3

## 2020-10-25 MED ORDER — CARVEDILOL 3.125 MG PO TABS: 3.1250 mg | ORAL_TABLET | Freq: Two times a day (BID) | ORAL | 1 refills | Status: DC

## 2020-10-25 MED ORDER — SODIUM BICARBONATE 650 MG PO TABS: 650.0000 mg | ORAL_TABLET | Freq: Two times a day (BID) | ORAL | 0 refills | Status: DC

## 2020-10-25 MED ORDER — PREDNISONE 20 MG PO TABS
40.0000 mg | ORAL_TABLET | Freq: Every day | ORAL | Status: DC
  Administered 2020-10-25: 40 mg via ORAL
  Filled 2020-10-25: qty 2

## 2020-10-25 MED ORDER — FUROSEMIDE 40 MG PO TABS: 40.0000 mg | ORAL_TABLET | Freq: Every day | ORAL | 1 refills | Status: DC

## 2020-10-25 MED ORDER — GUAIFENESIN ER 600 MG PO TB12: 600.0000 mg | ORAL_TABLET | Freq: Two times a day (BID) | ORAL | 0 refills | Status: AC

## 2020-10-25 MED ORDER — POTASSIUM CHLORIDE CRYS ER 20 MEQ PO TBCR: 20.0000 meq | EXTENDED_RELEASE_TABLET | Freq: Every day | ORAL | 1 refills | Status: DC

## 2020-10-25 MED ORDER — PREDNISONE 20 MG PO TABS: 40.0000 mg | ORAL_TABLET | Freq: Every day | ORAL | 0 refills | Status: AC

## 2020-10-25 NOTE — Discharge Summary (Signed)
Physician Discharge Summary  Shawn Jennings WUJ:[id number] DOB: 1946-09-23 DOA: 10/17/2020  PCP: Almetta [facility], [facility]  Admit date: 10/17/2020 Discharge date: 10/25/2020  Admitted From: Home Disposition:  Home  Discharge Condition:Stable CODE STATUS:FULL Diet recommendation: Dysphagia 3  Brief/Interim Summary:  75 year old male from ALF with history of paroxysmal A. fib who was initially admitted under [facility] service had to be moved to ICU after he developed septic shock from ascending cholangitis. Underwent ERCP with a stent placement. Hospital course remarkable for requirement of  pressors. He was transferred to our service on 10/21/2020 . Hospital course also remarkable for A. fib with RVR, AKI on CKD stage III.  Overall condition has improved, he has remained hemodynamically stable for discharge today back to ALF.  Following problems were addressed during his hospitalization:  Acute on chronic hypoxic respiratory failure:  Most likely this is from COPD exacerbation.  Currently respiratory status stable. He still smokes. He is not on oxygen at home.    Currently requiring 2-3 L of oxygen per minute.  Qualified for home oxygen.  Continue prednisone for 4 more days on discharge.  Septic shock secondary to ascending cholangitis: Needed vasopressor support after hospitalization and had to be transferred to ICU.  Currently blood pressure stable without vasopressors.  Cultures negative so far.  He was on Zosyn and zyvox.  GI recommended to follow-up as an outpatient for ERCP in 2 months to remove the stent.  Changed antibiotics to Augmentin, to be continued for one more week  Ascending cholangitis : Found to have 6X 5 mm stone in the distal common bile duct.  GI was consulted and he underwent ERCP with stone extraction.   Abx chnaged to oral.Cx negative  Elevated troponin: Most likely secondary to supply demand ischemia.  Denies any chest pain.  Ischemic cardiomyopathy: Currently  euvolumic.  Echo on 04/2020 showed ejection fraction of 25 to 30%, bioprosthetic aortic valve, aortic stenosis.  He has history of CABG in 1994.  Continue home medications.  He is on aspirin, Lipitor, carvedilol, Imdur, Entresto.Echo during this hospitalization showed ejection fraction of 50-55%, normal diastolic parameters.  A. fib with RVR: History of paroxysmal A. fib.  Currently in sinus rhythm.  On went into RVR during his hospitalization.  On xarelto.Given a dose of IV amiodarone.  Currently heart rate is stable .  Continue Coreg that he takes at home. On xarelto for anticoagulation  AKI on CKD stage IIIa: Currently kidney function at baseline.  He was given some IV fluids which has been stopped now.  Baseline creatinine in the range of 1.4-1.6  Non-anion gap metabolic acidosis: Associated CKD.  Started on bicarb tablets  Normocytic anemia: Monitor hemoglobin which is currently stable.  Diabetes mellitus type 2: Monitor CBGs.  Continue sliding scale insulin and Lantus.  Takes Metformin at home.HbA1C o f6.3 .  Depression: Continue home antidepressant  Hypokalemia: Supplemented and corrected.  Hyperlipidemia: On Lipitor  Tobacco abuse: Counseled for cessation.  Continue nicotine patch  Debility/deconditioning: Status post left AKA.  ALF resident.PT/Ot evaluation done,recommended HH    Discharge Diagnoses:  Active Problems:   Choledocholithiasis   Protein-calorie malnutrition, severe   Cholangitis    Discharge Instructions  Discharge Instructions    Diet general   Complete by: As directed    Dysphagia 3 diet   Discharge instructions   Complete by: As directed    1)Please take prescribed medications as instructed 2)Follow up with your PCP in a week.  Do a CBC, BMP test during the  follow-up 3)Follow up with gastroenterology as an outpatient 4 weeks.  Name and number of the provider has been attached 4)Follow up with your cardiologist   Increase activity slowly    Complete by: As directed      Allergies as of 10/25/2020   No Known Allergies     Medication List    TAKE these medications   acetaminophen 500 MG tablet Commonly known as: TYLENOL Take 500 mg by mouth every 6 (six) hours as needed.   albuterol (2.5 MG/3ML) 0.083% nebulizer solution Commonly known as: PROVENTIL Take 2.5 mg by nebulization every 4 (four) hours as needed for wheezing or shortness of breath.   Ventolin HFA 108 (90 Base) MCG/ACT inhaler Generic drug: albuterol Inhale 2 puffs into the lungs every 6 (six) hours as needed for wheezing or shortness of breath.   amoxicillin-clavulanate 875-125 MG tablet Commonly known as: AUGMENTIN Take 1 tablet by mouth every 12 (twelve) hours for 7 days.   aspirin EC 81 MG tablet Take 81 mg by mouth daily.   atorvastatin 80 MG tablet Commonly known as: LIPITOR Take 80 mg by mouth at bedtime.   carvedilol 3.125 MG tablet Commonly known as: COREG Take 1 tablet (3.125 mg total) by mouth 2 (two) times daily.   DULoxetine 30 MG capsule Commonly known as: CYMBALTA Take 30 mg by mouth 2 (two) times daily.   Entresto 24-26 MG Generic drug: sacubitril-valsartan Take 1 tablet by mouth 2 (two) times daily.   ferrous sulfate 325 (65 FE) MG tablet Take 325 mg by mouth daily.   Fluticasone-Salmeterol 250-50 MCG/DOSE Aepb Commonly known as: ADVAIR Inhale 1 puff into the lungs 2 (two) times daily.   furosemide 40 MG tablet Commonly known as: LASIX Take 1 tablet (40 mg total) by mouth daily. Take 1 tablet (80 mg total) once daily in the morning What changed:   medication strength  how much to take  how to take this  when to take this   guaiFENesin 600 MG 12 hr tablet Commonly known as: MUCINEX Take 1 tablet (600 mg total) by mouth 2 (two) times daily for 7 days.   hydrOXYzine 25 MG tablet Commonly known as: ATARAX/VISTARIL Take 25 mg by mouth every 6 (six) hours as needed (allergy symptoms).   isosorbide mononitrate 30  MG 24 hr tablet Commonly known as: IMDUR Take 1 tablet (30 mg total) by mouth daily.   ketotifen 0.025 % ophthalmic solution Commonly known as: ZADITOR Place 1 drop into both eyes 2 (two) times daily as needed.   lactulose 10 GM/15ML solution Commonly known as: CHRONULAC Take 30 g by mouth every 12 (twelve) hours as needed for mild constipation.   lamoTRIgine 25 MG tablet Commonly known as: LAMICTAL Take 25 mg by mouth 2 (two) times daily.   lidocaine 5 % Commonly known as: LIDODERM Cut 1 patch in half and apply to amputated stump once daily 12 hours on, 12 hours off   liver oil-zinc oxide 40 % ointment Commonly known as: DESITIN Apply 1 application topically at bedtime as needed for irritation. To buttocks   loperamide 2 MG capsule Commonly known as: IMODIUM Take by mouth.   LORazepam 0.5 MG tablet Commonly known as: ATIVAN Take 0.5 mg by mouth 2 (two) times daily as needed for anxiety.   melatonin 3 MG Tabs tablet Take 1 tablet by mouth at bedtime.   metFORMIN 500 MG tablet Commonly known as: GLUCOPHAGE Take 500 mg by mouth 2 (two) times daily with a  meal. What changed: Another medication with the same name was removed. Continue taking this medication, and follow the directions you see here.   montelukast 10 MG tablet Commonly known as: SINGULAIR Take 10 mg by mouth at bedtime.   nicotine 14 mg/24hr patch Commonly known as: NICODERM CQ - dosed in mg/24 hours Place 14 mg onto the skin daily.   nitroGLYCERIN 0.4 MG SL tablet Commonly known as: NITROSTAT Place 0.4 mg under the tongue every 5 (five) minutes as needed for chest pain.   pantoprazole 40 MG tablet Commonly known as: PROTONIX Take 40 mg by mouth daily.   potassium chloride SA 20 MEQ tablet Commonly known as: KLOR-CON Take 1 tablet (20 mEq total) by mouth daily.   predniSONE 20 MG tablet Commonly known as: DELTASONE Take 2 tablets (40 mg total) by mouth daily with breakfast for 4 days. Start  taking on: October 26, 2020   PreserVision AREDS 2 Caps Take 1 capsule by mouth 2 (two) times daily.   Rivaroxaban 15 MG Tabs tablet Commonly known as: XARELTO Take 15 mg by mouth daily.   senna 8.6 MG tablet Commonly known as: SENOKOT Take 2 tablets by mouth at bedtime.   sodium bicarbonate 650 MG tablet Take 1 tablet (650 mg total) by mouth 2 (two) times daily.   tamsulosin 0.4 MG Caps capsule Commonly known as: FLOMAX Take 0.4 mg by mouth daily.   traZODone 50 MG tablet Commonly known as: DESYREL Take 25 mg by mouth at bedtime as needed for sleep.   triamcinolone 0.1 % Commonly known as: KENALOG Apply 1 application topically 2 (two) times daily.            Durable Medical Equipment  (From admission, onward)         Start     Ordered   10/24/20 1338  For home use only DME oxygen  Once       Question Answer Comment  Length of Need Lifetime   Mode or (Route) Nasal cannula   Liters per Minute 3   Frequency Continuous (stationary and portable oxygen unit needed)   Oxygen delivery system Gas      10/24/20 1337          Follow-up Information    [facility]. Schedule an appointment as soon as possible for a visit in 1 week(s).   Specialty: Geriatric Medicine Contact information: [address] [place] [address] [phone]        [name], MD. Schedule an appointment as soon as possible for a visit in 4 week(s).   Specialty: Gastroenterology Contact information: [place] [address] [phone]              No Known Allergies  Consultations:  GI,PCCM   Procedures/Studies: DG Chest 2 View  Result Date: 10/17/2020 CLINICAL DATA:  Acute chest pain, status post CABG. 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 The heart size and mediastinal contours are unchanged. Status post aortic valve  replacement. Aortic arch calcifications. Bibasilar compressive changes. No definite focal consolidation. No pulmonary edema. No pleural effusion. No pneumothorax. No acute osseous abnormality.  Old healed left clavicular fracture. IMPRESSION: No active cardiopulmonary disease. Electronically Signed   By: Iven Finn M.D.   On: 10/17/2020 18:45   DG Abd 1 View  Result Date: 10/20/2020 CLINICAL DATA:  Abdominal pain.  Patient reports constipation. EXAM:  ABDOMEN - 1 VIEW COMPARISON:  CT 09/17/2020 FINDINGS: Plastic biliary stent in place. Cholecystectomy clips in the right upper quadrant. Moderate stool in the right colon. Air-filled distal transverse colon. Small volume of air and stool throughout the more distal colon. Patulous loop of small bowel in the left mid abdomen adjacent to enteric sutures, also seen on prior CT. There is no evidence of obstruction. Advanced vascular calcifications. No free intra-abdominal air on supine view. No acute osseous abnormalities are seen. IMPRESSION: 1. Nonobstructive bowel gas pattern. Moderate stool in the right colon. 2. Plastic biliary stent in place. 3. Patulous loop of small bowel in the left mid abdomen adjacent to enteric sutures, also seen on prior CT. Electronically Signed   By: Keith Rake M.D.   On: 10/20/2020 18:54   CT ABDOMEN PELVIS W CONTRAST  Result Date: 10/17/2020 CLINICAL DATA:  Acute abdominal pain.  Pancreatitis.  Vomiting. EXAM: CT ABDOMEN AND PELVIS WITH CONTRAST TECHNIQUE: Multidetector CT imaging of the abdomen and pelvis was performed using the standard protocol following bolus administration of intravenous contrast. CONTRAST:  47mL OMNIPAQUE IOHEXOL 300 MG/ML  SOLN COMPARISON:  None. FINDINGS: Lower chest: Prosthetic aortic valve. Coronary artery calcifications. Basilar emphysema. Ill-defined reticulonodular opacities in the dependent lower lobes. Areas of lower lobe mucous plugging. No pleural fluid. Hepatobiliary: Motion artifact  through the liver. There is no focal hepatic lesion. Post cholecystectomy. r there is intra and extrahepatic biliary ductal dilatation with common bile duct spanning 13 mm at the porta hepatis. There is a 6 x 5 mm stone in the distal common bile duct, series 2, image 39. Pancreas: No peripancreatic fat stranding. No pancreatic ductal dilatation. No pancreatic mass or pseudocyst. There is motion artifact through the head and uncinate process. Spleen: Calcified granuloma.  Normal in size. Adrenals/Urinary Tract: Mild left adrenal thickening. No suspicious adrenal nodule. Symmetric bilateral perinephric edema. There is no hydronephrosis. Renal vascular calcifications versus possible nonobstructing stones in the left kidney. Subcentimeter low-density in the upper right kidney is likely cyst. Tiny low-density lesion in the mid left kidney is too small to characterize. Both ureters are decompressed without stones along the course. Partially distended urinary bladder. Stomach/Bowel: Bowel evaluation is limited in the absence of enteric contrast, patient motion, and paucity of intra-abdominal fat. No abnormal gastric distension. Equivocal wall thickening about the greater curvature. There is a periampullary duodenal diverticulum without acute inflammation. Presumed enteric sutures in the left upper quadrant with patulous loop of small bowel. No obstruction. No evidence of small bowel inflammation. Appendix normal. Mild colonic tortuosity. Small to moderate colonic stool burden. No colonic wall thickening or inflammatory change. Vascular/Lymphatic: Advanced aortic and branch atherosclerosis with mixed calcified and noncalcified atheromatous plaque. Suspected high-grade stenosis of the common iliac arteries, left greater than right. Left superficial femoral artery including stent appear occluded. Plaque throughout the visceral branches of the abdomen. No evidence of acute vascular abnormality. Portal vein is patent. No bulky  abdominopelvic adenopathy. Reproductive: Prostatic calcifications. Other: No ascites, free air, or focal fluid collection. No abdominal wall hernia. Musculoskeletal: Osteopenia/osteoporosis. There are no acute or suspicious osseous abnormalities. IMPRESSION: 1. Choledocholithiasis with 6 x 5 mm stone in the distal common bile duct. Mild intra and extrahepatic biliary ductal dilatation. 2. Periampullary duodenal diverticulum. 3. No CT findings of pancreatitis. 4. Equivocal wall thickening about the greater curvature of the stomach, can be seen with gastritis. 5. Advanced aortic and branch atherosclerosis. Renal vascular calcifications versus possible nonobstructing stones in the left kidney. Occluded left  superficial femoral artery stent is likely chronic. 6. 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 Findings favor bronchiolitis, recommend correlation for aspiration risk factors. Aortic Atherosclerosis (ICD10-I70.0) and Emphysema (ICD10-J43.9). Electronically Signed   By: Keith Rake M.D.   On: 10/17/2020 20:31   DG Chest Port 1 View  Result Date: 10/21/2020 CLINICAL DATA:  75 year old male with respiratory failure, hypoxia. Choledocholithiasis. EXAM: PORTABLE CHEST 1 VIEW COMPARISON:  Portable chest 10/18/2020 and earlier. FINDINGS: Portable AP upright view at 0612 hours. Stable right IJ central line. Prior TAVR. Stable cardiac size and mediastinal contours. Patchy and confluent new left mid lung, perihilar opacity. Stable lung volumes and elsewhere lung markings are stable. No pneumothorax. Small veiling left pleural effusion also now suspected. Nonobstructed visible bowel gas pattern. No acute osseous abnormality identified. IMPRESSION: 1. Confluent new left mid lung opacity most suspicious for Pneumonia. Aspiration also a consideration. 2. Small new left pleural effusion suspected. Electronically Signed   By: Genevie Ann M.D.   On: 10/21/2020 06:27   DG Chest Port 1 View  Result Date: 10/18/2020 CLINICAL DATA:  Central line placement. EXAM: PORTABLE CHEST 1 VIEW COMPARISON:  10/17/2020 FINDINGS: The right IJ central venous catheter tip is in the distal SVC near the cavoatrial junction. No complicating features. The heart is normal in size. Stable tortuosity, ectasia and calcification of the thoracic aorta. Mild central vascular congestion without overt pulmonary edema. Streaky areas of atelectasis but no definite infiltrates or effusions. No pneumothorax. IMPRESSION: 1. Right IJ central venous catheter in good position without complicating features. 2. Streaky areas of atelectasis and mild central vascular congestion. Electronically Signed   By: Marijo Sanes M.D.   On: 10/18/2020 09:17   DG C-Arm 1-60 Min-No Report  Result Date: 10/19/2020 Fluoroscopy was utilized by the requesting physician.  No radiographic interpretation.   ECHOCARDIOGRAM COMPLETE  Result Date: 10/19/2020    ECHOCARDIOGRAM REPORT   Patient Name:   [facility] Date of Exam: 10/19/2020 Medical Rec #:  [id number]      Height:       72.0 in Accession #:    [id number]     Weight:       160.0 lb Date of Birth:  08/02/1946      BSA:          1.938 m Patient Age:    75 years       BP:           113/58 mmHg Patient Gender: M              HR:           64 bpm. Exam Location:  [facility] Procedure: 2D Echo, Color Doppler and Cardiac Doppler Indications:     Shock  History:         Patient has prior history of Echocardiogram examinations, most                  recent 05/05/2020. COPD; Risk Factors:Diabetes and Hypertension.                  PAF, aortic stenosis.  Sonographer:     Sherrie Sport RDCS (AE) Referring Phys:  [id number] Tyna Jaksch Diagnosing Phys: Bartholome Bill MD IMPRESSIONS  1. Left ventricular ejection fraction, by estimation, is 50 to 55%. The left ventricle has low normal function. The left ventricle has no regional wall motion abnormalities.  The left ventricular internal cavity  size was mildly dilated. Left ventricular diastolic parameters were normal.  2. Right ventricular systolic function is normal. The right ventricular size is mildly enlarged.  3. Left atrial size was mildly dilated.  4. Right atrial size was mildly dilated.  5. The mitral valve was not well visualized. Trivial mitral valve regurgitation.  6. The aortic valve was not well visualized. Aortic valve regurgitation is trivial. Mild aortic valve stenosis. FINDINGS  Left Ventricle: Left ventricular ejection fraction, by estimation, is 50 to 55%. The left ventricle has low normal function. The left ventricle has no regional wall motion abnormalities. The left ventricular internal cavity size was mildly dilated. There is no left ventricular hypertrophy. Left ventricular diastolic parameters were normal. Right Ventricle: The right ventricular size is mildly enlarged. No increase in right ventricular wall thickness. Right ventricular systolic function is normal. Left Atrium: Left atrial size was mildly dilated. Right Atrium: Right atrial size was mildly dilated. Pericardium: There is no evidence of pericardial effusion. Mitral Valve: The mitral valve was not well visualized. Trivial mitral valve regurgitation. MV peak gradient, 9.5 mmHg. The mean mitral valve gradient is 3.0 mmHg. Tricuspid Valve: The tricuspid valve is not well visualized. Tricuspid valve regurgitation is trivial. Aortic Valve: The aortic valve was not well visualized. Aortic valve regurgitation is trivial. Mild aortic stenosis is present. Aortic valve mean gradient measures 18.7 mmHg. Aortic valve peak gradient measures 32.0 mmHg. Aortic valve area, by VTI measures 1.11 cm. Pulmonic Valve: The pulmonic valve was not assessed. Pulmonic valve regurgitation is not visualized. Aorta: The aortic root was not well visualized. IAS/Shunts: The interatrial septum was not assessed.  LEFT VENTRICLE PLAX 2D LVIDd:         5.64 cm       Diastology LVIDs:         3.62 cm      LV e' medial:    5.22 cm/s LV PW:         1.35 cm      LV E/e' medial:  26.2 LV IVS:        0.94 cm      LV e' lateral:   7.29 cm/s LVOT diam:     2.10 cm      LV E/e' lateral: 18.8 LV SV:         69 LV SV Index:   35 LVOT Area:     3.46 cm  LV Volumes (MOD) LV vol d, MOD A2C: 97.4 ml LV vol d, MOD A4C: 141.0 ml LV vol s, MOD A2C: 54.6 ml LV vol s, MOD A4C: 66.6 ml LV SV MOD A2C:     42.8 ml LV SV MOD A4C:     141.0 ml LV SV MOD BP:      58.5 ml RIGHT VENTRICLE RV Basal diam:  3.39 cm RV S prime:     9.68 cm/s TAPSE (M-mode): 3.8 cm LEFT ATRIUM             Index       RIGHT ATRIUM           Index LA diam:        4.60 cm 2.37 cm/m  RA Area:     27.90 cm LA Vol (A2C):   88.6 ml 45.72 ml/m RA Volume:   97.60 ml  50.37 ml/m LA Vol (A4C):   70.5 ml 36.38 ml/m LA Biplane Vol: 78.6 ml 40.56 ml/m  AORTIC VALVE  PULMONIC VALVE AV Area (Vmax):    1.05 cm     PV Vmax:        0.83 m/s AV Area (Vmean):   0.97 cm     PV Peak grad:   2.7 mmHg AV Area (VTI):     1.11 cm     RVOT Peak grad: 3 mmHg AV Vmax:           283.00 cm/s AV Vmean:          204.000 cm/s AV VTI:            0.620 m AV Peak Grad:      32.0 mmHg AV Mean Grad:      18.7 mmHg LVOT Vmax:         85.50 cm/s LVOT Vmean:        57.100 cm/s LVOT VTI:          0.198 m LVOT/AV VTI ratio: 0.32  AORTA Ao Root diam: 3.30 cm MITRAL VALVE                TRICUSPID VALVE MV Area (PHT): 2.78 cm     TR Peak grad:   25.8 mmHg MV Peak grad:  9.5 mmHg     TR Vmax:        254.00 cm/s MV Mean grad:  3.0 mmHg MV Vmax:       1.54 m/s     SHUNTS MV Vmean:      82.6 cm/s    Systemic VTI:  0.20 m MV Decel Time: 273 msec     Systemic Diam: 2.10 cm MV E velocity: 137.00 cm/s MV A velocity: 127.00 cm/s MV E/A ratio:  1.08 Bartholome Bill MD Electronically signed by Bartholome Bill MD Signature Date/Time: 10/19/2020/12:32:55 PM    Final        Subjective:  Patient seen and examined the bedside this morning.  Hemodynamically  stable for discharge today.  Discharge Exam: Vitals:   10/25/20 0749 10/25/20 1122  BP: 126/77 138/79  Pulse: 82 79  Resp: 20 17  Temp: 98 F (36.7 C) 97.9 F (36.6 C)  SpO2: 98% 99%   Vitals:   10/24/20 2354 10/25/20 0435 10/25/20 0749 10/25/20 1122  BP: 135/75 126/73 126/77 138/79  Pulse: 68 69 82 79  Resp: 16 17 20 17   Temp: (!) 97.4 F (36.3 C) 97.8 F (36.6 C) 98 F (36.7 C) 97.9 F (36.6 C)  TempSrc: Oral Oral Oral Oral  SpO2:  98% 98% 99%  Weight:      Height:        General: Pt is alert, awake, not in acute distress Cardiovascular: RRR, S1/S2 +, no rubs, no gallops Respiratory: CTA bilaterally, no wheezing, no rhonchi Abdominal: Soft, NT, ND, bowel sounds + Extremities: no edema, no cyanosis    The results of significant diagnostics from this hospitalization (including imaging, microbiology, ancillary and laboratory) are listed below for reference.     Microbiology: Recent Results (from the past 240 hour(s))  Resp Panel by RT-PCR (Flu A&B, Covid) Nasopharyngeal Swab     Status: None   Collection Time: 10/17/20  9:19 PM   Specimen: Nasopharyngeal Swab; Nasopharyngeal(NP) swabs in vial transport medium  Result Value Ref Range Status   SARS Coronavirus 2 by RT PCR NEGATIVE NEGATIVE Final    Comment: (NOTE) SARS-CoV-2 target nucleic acids are NOT DETECTED.  The SARS-CoV-2 RNA is generally detectable in upper respiratory specimens during the acute phase of infection. The lowest concentration  of SARS-CoV-2 viral copies this assay can detect is 138 copies/mL. A negative result does not preclude SARS-Cov-2 infection and should not be used as the sole basis for treatment or other patient management decisions. A negative result may occur with  improper specimen collection/handling, submission of specimen other than nasopharyngeal swab, presence of viral mutation(s) within the areas targeted by this assay, and inadequate number of viral copies(<138 copies/mL).  A negative result must be combined with clinical observations, patient history, and epidemiological information. The expected result is Negative.  Fact Sheet for Patients:  EntrepreneurPulse.com.au  Fact Sheet for Healthcare Providers:  IncredibleEmployment.be  This test is no t yet approved or cleared by the [place] FDA and  has been authorized for detection and/or diagnosis of SARS-CoV-2 by FDA under an Emergency Use Authorization (EUA). This EUA will remain  in effect (meaning this test can be used) for the duration of the COVID-19 declaration under Section 564(b)(1) of the Act, 21 U.S.C.section 360bbb-3(b)(1), unless the authorization is terminated  or revoked sooner.       Influenza A by PCR NEGATIVE NEGATIVE Final   Influenza B by PCR NEGATIVE NEGATIVE Final    Comment: (NOTE) The Xpert Xpress SARS-CoV-2/FLU/RSV plus assay is intended as an aid in the diagnosis of influenza from Nasopharyngeal swab specimens and should not be used as a sole basis for treatment. Nasal washings and aspirates are unacceptable for Xpert Xpress SARS-CoV-2/FLU/RSV testing.  Fact Sheet for Patients: EntrepreneurPulse.com.au  Fact Sheet for Healthcare Providers: IncredibleEmployment.be  This test is not yet approved or cleared by the [place] FDA and has been authorized for detection and/or diagnosis of SARS-CoV-2 by FDA under an Emergency Use Authorization (EUA). This EUA will remain in effect (meaning this test can be used) for the duration of the COVID-19 declaration under Section 564(b)(1) of the Act, 21 U.S.C. section 360bbb-3(b)(1), unless the authorization is terminated or revoked.  Performed at [facility], [address] [place], [place] [address]   MRSA PCR Screening     Status: Abnormal   Collection Time: 10/18/20 12:17 AM   Specimen: Nasal Mucosa; Nasopharyngeal  Result Value Ref  Range Status   MRSA by PCR POSITIVE (A) NEGATIVE Final    Comment:        The GeneXpert MRSA Assay (FDA approved for NASAL specimens only), is one component of a comprehensive MRSA colonization surveillance program. It is not intended to diagnose MRSA infection nor to guide or monitor treatment for MRSA infections. RESULT CALLED TO, READ BACK BY AND VERIFIED WITH: MARSHA HATCH @0204  ON 10/18/20 SKL Performed at [facility], [address] [place], [place] [address]   Culture, blood (routine x 2)     Status: None   Collection Time: 10/18/20  4:41 AM   Specimen: BLOOD  Result Value Ref Range Status   Specimen Description BLOOD LEFT WRIST  Final   Special Requests   Final    BOTTLES DRAWN AEROBIC AND ANAEROBIC Blood Culture adequate volume   Culture   Final    NO GROWTH 5 DAYS Performed at [facility], [address] [place], [place] [address]    Report Status 10/23/2020 FINAL  Final  Culture, blood (routine x 2)     Status: None   Collection Time: 10/18/20  4:53 AM   Specimen: BLOOD  Result Value Ref Range Status   Specimen Description BLOOD LEFT ASSIST CONTROL  Final   Special Requests   Final    BOTTLES DRAWN  AEROBIC AND ANAEROBIC Blood Culture adequate volume   Culture   Final    NO GROWTH 5 DAYS Performed at [facility], [address] [place], [place] [address]    Report Status 10/23/2020 FINAL  Final  Urine Culture     Status: None   Collection Time: 10/18/20  9:02 AM   Specimen: Urine, Random  Result Value Ref Range Status   Specimen Description   Final    URINE, RANDOM Performed at [facility], [address] [place], [place] [address]    Special Requests   Final    NONE Performed at [facility], [address], [place], [place] [address]    Culture   Final    NO GROWTH Performed at [facility], [place] [address] [place], [place] [address]    Report Status 10/19/2020 FINAL  Final      Labs: BNP (last 3 results) No results for input(s): BNP in the last 8760 hours. Basic Metabolic Panel: Recent Labs  Lab 10/18/20 2017 10/19/20 [id number] 10/19/20 [id number] 10/20/20 0424 10/21/20 0429 10/22/20 0415 10/23/20 0448 10/23/20 1459  NA  --  139   < > 138 138 138 141 138  K  --  3.5   < > 3.8 3.2* 4.4 4.3 4.5  CL  --  108   < > 109 107 108 110 109  CO2  --  20*   < > 19* 21* 20* 20* 21*  GLUCOSE  --  138*   < > 241* 227* 228* 106* 223*  BUN  --  27*   < > 34* 30* 26* 20 20  CREATININE  --  1.70*   < > 1.95* 1.77* 1.73* 1.62* 1.68*  CALCIUM  --  7.1*   < > 7.1* 7.4* 8.0* 8.4* 8.3*  MG 3.0* 2.5*  --   --   --  2.0  --   --    < > = values in this interval not displayed.   Liver Function Tests: Recent Labs  Lab 10/20/20 0424 10/21/20 0429 10/22/20 0415 10/23/20 0448 10/23/20 1459  AST 70* 38 27 28 24   ALT 206* 158* 124* 106* 90*  ALKPHOS 276* 241* 220* 192* 188*  BILITOT 2.0* 1.2 1.1 1.2 1.1  PROT 5.3* 5.4* 5.4* 5.8* 5.7*  ALBUMIN 2.4* 2.5* 2.4* 2.6* 2.3*   Recent Labs  Lab 10/20/20 2225 10/22/20 0415 10/23/20 0448  LIPASE 504* 354* 289*  AMYLASE  --  218*  --    No results for input(s): AMMONIA in the last 168 hours. CBC: Recent Labs  Lab 10/21/20 0429 10/22/20 0415 10/23/20 0448 10/24/20 0445 10/25/20 0559  WBC 10.3 12.8* 12.6* 13.2* 12.5*  NEUTROABS 8.6* 10.7* 9.3* 9.7* 11.1*  HGB 9.3* 9.2* 9.9* 9.2* 9.3*  HCT 27.8* 27.8* 29.9* 28.6* 29.7*  MCV 84.2 85.3 84.5 86.9 88.9  PLT 170 174 215 207 219   Cardiac Enzymes: No results for input(s): CKTOTAL, CKMB, CKMBINDEX, TROPONINI in the last 168 hours. BNP: Invalid input(s): POCBNP CBG: Recent Labs  Lab 10/24/20 2126 10/24/20 2352 10/25/20 0433 10/25/20 0745 10/25/20 1124  GLUCAP 263* 259* 351* 298* 229*   D-Dimer No results for input(s): DDIMER in the last 72 hours. Hgb A1c No results for input(s): HGBA1C in the last 72 hours. Lipid Profile No results for input(s): CHOL, HDL, LDLCALC,  TRIG, CHOLHDL, LDLDIRECT in the last 72 hours. Thyroid function studies No results for input(s): TSH, T4TOTAL, T3FREE, THYROIDAB in the  last 72 hours.  Invalid input(s): FREET3 Anemia work up No results for input(s): VITAMINB12, FOLATE, FERRITIN, TIBC, IRON, RETICCTPCT in the last 72 hours. Urinalysis    Component Value Date/Time   COLORURINE AMBER (A) 10/18/2020 0902   APPEARANCEUR CLEAR (A) 10/18/2020 0902   LABSPEC 1.026 10/18/2020 0902   PHURINE 5.0 10/18/2020 0902   GLUCOSEU NEGATIVE 10/18/2020 0902   HGBUR NEGATIVE 10/18/2020 0902   BILIRUBINUR NEGATIVE 10/18/2020 0902   KETONESUR NEGATIVE 10/18/2020 0902   PROTEINUR NEGATIVE 10/18/2020 0902   NITRITE NEGATIVE 10/18/2020 0902   LEUKOCYTESUR NEGATIVE 10/18/2020 0902   Sepsis Labs Invalid input(s): PROCALCITONIN,  WBC,  LACTICIDVEN Microbiology Recent Results (from the past 240 hour(s))  Resp Panel by RT-PCR (Flu A&B, Covid) Nasopharyngeal Swab     Status: None   Collection Time: 10/17/20  9:19 PM   Specimen: Nasopharyngeal Swab; Nasopharyngeal(NP) swabs in vial transport medium  Result Value Ref Range Status   SARS Coronavirus 2 by RT PCR NEGATIVE NEGATIVE Final    Comment: (NOTE) SARS-CoV-2 target nucleic acids are NOT DETECTED.  The SARS-CoV-2 RNA is generally detectable in upper respiratory specimens during the acute phase of infection. The lowest concentration of SARS-CoV-2 viral copies this assay can detect is 138 copies/mL. A negative result does not preclude SARS-Cov-2 infection and should not be used as the sole basis for treatment or other patient management decisions. A negative result may occur with  improper specimen collection/handling, submission of specimen other than nasopharyngeal swab, presence of viral mutation(s) within the areas targeted by this assay, and inadequate number of viral copies(<138 copies/mL). A negative result must be combined with clinical observations, patient history, and  epidemiological information. The expected result is Negative.  Fact Sheet for Patients:  EntrepreneurPulse.com.au  Fact Sheet for Healthcare Providers:  IncredibleEmployment.be  This test is no t yet approved or cleared by the [place] FDA and  has been authorized for detection and/or diagnosis of SARS-CoV-2 by FDA under an Emergency Use Authorization (EUA). This EUA will remain  in effect (meaning this test can be used) for the duration of the COVID-19 declaration under Section 564(b)(1) of the Act, 21 U.S.C.section 360bbb-3(b)(1), unless the authorization is terminated  or revoked sooner.       Influenza A by PCR NEGATIVE NEGATIVE Final   Influenza B by PCR NEGATIVE NEGATIVE Final    Comment: (NOTE) The Xpert Xpress SARS-CoV-2/FLU/RSV plus assay is intended as an aid in the diagnosis of influenza from Nasopharyngeal swab specimens and should not be used as a sole basis for treatment. Nasal washings and aspirates are unacceptable for Xpert Xpress SARS-CoV-2/FLU/RSV testing.  Fact Sheet for Patients: EntrepreneurPulse.com.au  Fact Sheet for Healthcare Providers: IncredibleEmployment.be  This test is not yet approved or cleared by the [place] FDA and has been authorized for detection and/or diagnosis of SARS-CoV-2 by FDA under an Emergency Use Authorization (EUA). This EUA will remain in effect (meaning this test can be used) for the duration of the COVID-19 declaration under Section 564(b)(1) of the Act, 21 U.S.C. section 360bbb-3(b)(1), unless the authorization is terminated or revoked.  Performed at [facility], [address] [place], [place] [address]   MRSA PCR Screening     Status: Abnormal   Collection Time: 10/18/20 12:17 AM   Specimen: Nasal Mucosa; Nasopharyngeal  Result Value Ref Range Status   MRSA by PCR POSITIVE (A) NEGATIVE Final    Comment:        The  GeneXpert MRSA Assay (FDA approved  for NASAL specimens only), is one component of a comprehensive MRSA colonization surveillance program. It is not intended to diagnose MRSA infection nor to guide or monitor treatment for MRSA infections. RESULT CALLED TO, READ BACK BY AND VERIFIED WITH: MARSHA HATCH @0204  ON 10/18/20 SKL Performed at [facility], [address] [place], [place] [address]   Culture, blood (routine x 2)     Status: None   Collection Time: 10/18/20  4:41 AM   Specimen: BLOOD  Result Value Ref Range Status   Specimen Description BLOOD LEFT WRIST  Final   Special Requests   Final    BOTTLES DRAWN AEROBIC AND ANAEROBIC Blood Culture adequate volume   Culture   Final    NO GROWTH 5 DAYS Performed at [facility], [address] [place], [place] [address]    Report Status 10/23/2020 FINAL  Final  Culture, blood (routine x 2)     Status: None   Collection Time: 10/18/20  4:53 AM   Specimen: BLOOD  Result Value Ref Range Status   Specimen Description BLOOD LEFT ASSIST CONTROL  Final   Special Requests   Final    BOTTLES DRAWN AEROBIC AND ANAEROBIC Blood Culture adequate volume   Culture   Final    NO GROWTH 5 DAYS Performed at [facility], [address], [place], [place] [address]    Report Status 10/23/2020 FINAL  Final  Urine Culture     Status: None   Collection Time: 10/18/20  9:02 AM   Specimen: Urine, Random  Result Value Ref Range Status   Specimen Description   Final    URINE, RANDOM Performed at [facility], [address], [place], [place] [address]    Special Requests   Final    NONE Performed at [facility], [address] [place], [place] [address]    Culture   Final    NO GROWTH Performed at [facility], [place] [address] [place], [place] [address]    Report Status 10/19/2020 FINAL  Final    Please note: You were cared for by a hospitalist during your hospital stay. Once you  are discharged, your primary care physician will handle any further medical issues. Please note that NO REFILLS for any discharge medications will be authorized once you are discharged, as it is imperative that you return to your primary care physician (or establish a relationship with a primary care physician if you do not have one) for your post hospital discharge needs so that they can reassess your need for medications and monitor your lab values.    Time coordinating discharge: 40 minutes  SIGNED:   Shelly Coss, MD  [facility] 10/25/2020, 3:11 PM Pager [phone]  If 7PM-7AM, please contact night-coverage [email or web address] Password [email or web address]

## 2020-10-25 NOTE — Progress Notes (Signed)
Inpatient Diabetes Program Recommendations  AACE/ADA: New Consensus Statement on Inpatient Glycemic Control   Target Ranges:  Prepandial:   less than 140 mg/dL      Peak postprandial:   less than 180 mg/dL (1-2 hours)      Critically ill patients:  140 - 180 mg/dL   Results for BAYLEY, HURN (MRN [id number]) as of 10/25/2020 07:02  Ref. Range 10/24/2020 07:16 10/24/2020 11:28 10/24/2020 16:37 10/24/2020 19:50 10/24/2020 21:26 10/24/2020 23:52 10/25/2020 04:33  Glucose-Capillary Latest Ref Range: 70 - 99 mg/dL 509 (H) 326 (H) 712 (H) 240 (H) 263 (H) 259 (H) 351 (H)   Review of Glycemic Control  Diabetes history: DM2 Outpatient Diabetes medications: Metformin 500 mg BID Current orders for Inpatient glycemic control: Levemir 20 units QHS, Novolog 0-20 units TID with meals, Novolog 0-5 units QHS; Solumedrol 40 mg Q12H  Inpatient Diabetes Program Recommendations:    Insulin: If steroids are continued as ordered, please consider increasing Levemir to 25 units QHS and adding Novolog 4 units TID with meals for meal coverage if patient eats at least 50% of meals.  Thanks, Orlando Penner, RN, MSN, CDE Diabetes Coordinator Inpatient Diabetes Program [phone] (Team Pager from 8am to 5pm)

## 2020-10-25 NOTE — Progress Notes (Signed)
Patient blood sugar 351. MD Mansy notified. Received order for 8 units of novoLOG. Administered to patient.

## 2020-10-25 NOTE — Progress Notes (Signed)
Patient complaining of not being able to breathe.O2 sat checked. Reading 97%. RT was called. His next Duoneb isn't available until 8 am and his inhaler until 10 am. MD [facility] notified. Received order for albuterol only treatment to be given now. Called RT back to confirm new order. Will administer and continue to monitor.

## 2020-10-25 NOTE — NC FL2 (Signed)
[place] LEVEL OF CARE SCREENING TOOL     IDENTIFICATION  Patient Name: Shawn Jennings Birthdate: 09/10/1946 Sex: male Admission Date (Current Location): 10/17/2020  [facility] and [place] Number:  Engineering geologist and Address:  [facility], [address] [place], [place] [address]      Provider Number: [id number]  Attending Physician Name and Address:  Shelly Coss, MD  Relative Name and Phone Number:       Current Level of Care: Hospital Recommended Level of Care: [place] Prior Approval Number:    Date Approved/Denied:   PASRR Number:    Discharge Plan: SNF    Current Diagnoses: Patient Active Problem List   Diagnosis Date Noted  . Cholangitis   . Protein-calorie malnutrition, severe 10/18/2020  . Choledocholithiasis 10/17/2020  . Coronary artery disease of native artery of native heart with stable angina pectoris ([place])   . Ischemic cardiomyopathy   . Other chest pain 03/10/2020  . Chronic kidney disease, stage 3a ([place]) 03/09/2020  . Essential hypertension 03/09/2020  . Chest pain 01/27/2019  . Renal insufficiency   . Nonspecific chest pain 01/26/2019    Orientation RESPIRATION BLADDER Height & Weight     Self,Time,Situation,Place  O2 (2L) Continent Weight: 69.5 kg Height:  6' (182.9 cm)  BEHAVIORAL SYMPTOMS/MOOD NEUROLOGICAL BOWEL NUTRITION STATUS      Continent Diet (Dysphagia 3 with thin liquids, no straws)  AMBULATORY STATUS COMMUNICATION OF NEEDS Skin   Independent Verbally Normal                       Personal Care Assistance Level of Assistance  Bathing,Feeding,Dressing Bathing Assistance: Limited assistance Feeding assistance: Independent Dressing Assistance: Limited assistance     Functional Limitations Info             SPECIAL CARE FACTORS FREQUENCY  PT (By licensed PT),OT (By licensed OT)                    Contractures Contractures Info: Not present     Additional Factors Info  Code Status,Allergies Code Status Info: Full Allergies Info: No known allergies           Current Medications (10/25/2020):  This is the current hospital active medication list Current Facility-Administered Medications  Medication Dose Route Frequency Provider Last Rate Last Admin  . 0.9 %  sodium chloride infusion  250 mL Intravenous Continuous Lang Snow, NP   Stopped at 10/21/20 [phone]  . acetaminophen (TYLENOL) tablet 650 mg  650 mg Oral Q6H PRN Mansy, Jan A, MD   650 mg at 10/24/20 1952   Or  . acetaminophen (TYLENOL) suppository 650 mg  650 mg Rectal Q6H PRN Mansy, Jan A, MD      . amoxicillin-clavulanate (AUGMENTIN) 875-125 MG per tablet 1 tablet  1 tablet Oral Q12H Shelly Coss, MD   1 tablet at 10/25/20 0930  . atorvastatin (LIPITOR) tablet 80 mg  80 mg Oral QHS Mansy, Jan A, MD   80 mg at 10/24/20 2132  . carvedilol (COREG) tablet 3.125 mg  3.125 mg Oral BID Shelly Coss, MD   3.125 mg at 10/25/20 0931  . Chlorhexidine Gluconate Cloth 2 % PADS 6 each  6 each Topical Daily Shelly Coss, MD   6 each at 10/25/20 1129  . docusate sodium (COLACE) capsule 100 mg  100 mg Oral BID Darel Hong D, NP   100 mg at 10/25/20 0930  . DULoxetine (  CYMBALTA) DR capsule 30 mg  30 mg Oral BID Awilda Bill, NP   30 mg at 10/25/20 0931  . feeding supplement (ENSURE ENLIVE / ENSURE PLUS) liquid 237 mL  237 mL Oral TID BM Candee Furbish, MD   237 mL at 10/25/20 1444  . ferrous sulfate tablet 325 mg  325 mg Oral Daily Mansy, Jan A, MD   325 mg at 10/25/20 0944  . guaiFENesin (MUCINEX) 12 hr tablet 600 mg  600 mg Oral BID Shelly Coss, MD   600 mg at 10/25/20 0930  . HYDROmorphone (DILAUDID) injection 0.5 mg  0.5 mg Intravenous Q3H PRN Awilda Bill, NP   0.5 mg at 10/23/20 2223  . hydrOXYzine (ATARAX/VISTARIL) tablet 25 mg  25 mg Oral Q6H PRN Mansy, Jan A, MD      . insulin aspart (novoLOG) injection 0-20 Units  0-20 Units Subcutaneous TID WC  Candee Furbish, MD   7 Units at 10/25/20 1241  . insulin aspart (novoLOG) injection 0-5 Units  0-5 Units Subcutaneous QHS Candee Furbish, MD   3 Units at 10/24/20 2135  . insulin detemir (LEVEMIR) injection 20 Units  20 Units Subcutaneous QHS Shelly Coss, MD   20 Units at 10/24/20 2136  . ipratropium-albuterol (DUONEB) 0.5-2.5 (3) MG/3ML nebulizer solution 3 mL  3 mL Nebulization Q6H Adhikari, Amrit, MD   3 mL at 10/25/20 1414  . lactulose (CHRONULAC) 10 GM/15ML solution 30 g  30 g Oral Q12H PRN Mansy, Jan A, MD      . lamoTRIgine (LAMICTAL) tablet 25 mg  25 mg Oral BID Mansy, Jan A, MD   25 mg at 10/25/20 0931  . LORazepam (ATIVAN) tablet 0.5 mg  0.5 mg Oral BID PRN Mansy, Jan A, MD   0.5 mg at 10/25/20 0930  . magnesium hydroxide (MILK OF MAGNESIA) suspension 30 mL  30 mL Oral Daily PRN Mansy, Jan A, MD      . melatonin tablet 2.5 mg  2.5 mg Oral QHS Awilda Bill, NP   2.5 mg at 10/24/20 2133  . midodrine (PROAMATINE) tablet 5 mg  5 mg Oral TID WC Darel Hong D, NP   5 mg at 10/25/20 1241  . mometasone-formoterol (DULERA) 200-5 MCG/ACT inhaler 2 puff  2 puff Inhalation BID Mansy, Arvella Merles, MD   2 puff at 10/24/20 2136  . multivitamin with minerals tablet 1 tablet  1 tablet Oral Daily Candee Furbish, MD   1 tablet at 10/25/20 0931  . multivitamin-lutein (OCUVITE-LUTEIN) capsule 1 capsule  1 capsule Oral BID Mansy, Jan A, MD   1 capsule at 10/25/20 0931  . nicotine (NICODERM CQ - dosed in mg/24 hours) patch 14 mg  14 mg Transdermal Daily Mansy, Jan A, MD   14 mg at 10/25/20 0945  . nitroGLYCERIN (NITROSTAT) SL tablet 0.4 mg  0.4 mg Sublingual Q5 min PRN Mansy, Jan A, MD      . ondansetron [facility]) tablet 4 mg  4 mg Oral Q6H PRN Mansy, Jan A, MD   4 mg at 10/20/20 1559   Or  . ondansetron [facility]) injection 4 mg  4 mg Intravenous Q6H PRN Mansy, Jan A, MD      . pantoprazole (PROTONIX) injection 40 mg  40 mg Intravenous Q24H Awilda Bill, NP   40 mg at 10/24/20 2131  . predniSONE  (DELTASONE) tablet 40 mg  40 mg Oral Q breakfast Shelly Coss, MD   40 mg at 10/25/20 0944  .  promethazine (PHENERGAN) injection 12.5 mg  12.5 mg Intravenous Q6H PRN Lorin Glass, MD      . Rivaroxaban Carlena Hurl) tablet 15 mg  15 mg Oral Daily Salena Saner, MD   15 mg at 10/25/20 0932  . senna (SENOKOT) tablet 17.2 mg  2 tablet Oral QHS Mansy, Jan A, MD   17.2 mg at 10/24/20 2133  . sodium bicarbonate tablet 650 mg  650 mg Oral BID Burnadette Pop, MD   650 mg at 10/25/20 0932  . sodium chloride flush (NS) 0.9 % injection 10-40 mL  10-40 mL Intracatheter Q12H Burnadette Pop, MD   10 mL at 10/25/20 0934  . sodium chloride flush (NS) 0.9 % injection 10-40 mL  10-40 mL Intracatheter PRN Burnadette Pop, MD      . tamsulosin (FLOMAX) capsule 0.4 mg  0.4 mg Oral Daily Eugenie Norrie, NP   0.4 mg at 10/25/20 0930  . traZODone (DESYREL) tablet 25 mg  25 mg Oral QHS PRN Mansy, Jan A, MD   25 mg at 10/22/20 2045     Discharge Medications: Please see discharge summary for a list of discharge medications.  Relevant Imaging Results:  Relevant Lab Results:   Additional Information ssn  [id number]  Trenton Founds, RN

## 2020-10-25 NOTE — Progress Notes (Signed)
Pt to d/c per MD order. Pt discharge instructions discussed with pt, pt verbalized understanding. IV removed, catherer intact.awaiting EMS transportation.

## 2020-10-25 NOTE — Progress Notes (Signed)
Patient supposed to be on telemetry. Currently no boxes available. MD Mansy & CCMD made aware. Will put patient on a box once one becomes available.

## 2020-10-25 NOTE — NC FL2 (Signed)
[place] Level LEVEL OF CARE SCREENING TOOL     IDENTIFICATION  Patient Name: Shawn Jennings Birthdate: 1945-12-23 Sex: male Admission Date (Current Location): 10/17/2020  [facility] [place] Number:  Engineering geologist and Address:  [facility], [address] [place], [place] [address]      Provider Number: [id number]  Attending Physician Name and Address:  Shelly Coss, MD  Relative Name and Phone Number:       Current Level of Care: Hospital Recommended Level of Care: [place] Prior Approval Number:    Date Approved/Denied:   PASRR Number:    Discharge Plan: SNF    Current Diagnoses: Patient Active Problem List   Diagnosis Date Noted  . Cholangitis   . Protein-calorie malnutrition, severe 10/18/2020  . Choledocholithiasis 10/17/2020  . Coronary artery disease of native artery of native heart with stable angina pectoris ([place])   . Ischemic cardiomyopathy   . Other chest pain 03/10/2020  . Chronic kidney disease, stage 3a ([place]) 03/09/2020  . Essential hypertension 03/09/2020  . Chest pain 01/27/2019  . Renal insufficiency   . Nonspecific chest pain 01/26/2019    Orientation RESPIRATION BLADDER Height & Weight     Self,Time,Situation,Place  O2 (2L) Continent Weight: 69.5 kg Height:  6' (182.9 cm)  BEHAVIORAL SYMPTOMS/MOOD NEUROLOGICAL BOWEL NUTRITION STATUS      Continent Diet (Dysphagia 3 with thin liquids, no straws)  AMBULATORY STATUS COMMUNICATION OF NEEDS Skin   Independent Verbally Normal                       Personal Care Assistance Level of Assistance  Bathing,Feeding,Dressing Bathing Assistance: Limited assistance Feeding assistance: Independent Dressing Assistance: Limited assistance     Functional Limitations Info             SPECIAL CARE FACTORS FREQUENCY  PT (By licensed PT),OT (By licensed OT)                    Contractures Contractures Info: Not present     Additional Factors Info  Code Status,Allergies Code Status Info: Full Allergies Info: No known allergies           Current Medications (10/25/2020):   Discharge Medications: Medication List        TAKE these medications       acetaminophen 500 MG tablet Commonly known as: TYLENOL Take 500 mg by mouth every 6 (six) hours as needed.   albuterol (2.5 MG/3ML) 0.083% nebulizer solution Commonly known as: PROVENTIL Take 2.5 mg by nebulization every 4 (four) hours as needed for wheezing or shortness of breath.   Ventolin HFA 108 (90 Base) MCG/ACT inhaler Generic drug: albuterol Inhale 2 puffs into the lungs every 6 (six) hours as needed for wheezing or shortness of breath.   amoxicillin-clavulanate 875-125 MG tablet Commonly known as: AUGMENTIN Take 1 tablet by mouth every 12 (twelve) hours for 7 days.   aspirin EC 81 MG tablet Take 81 mg by mouth daily.   atorvastatin 80 MG tablet Commonly known as: LIPITOR Take 80 mg by mouth at bedtime.   carvedilol 3.125 MG tablet Commonly known as: COREG Take 1 tablet (3.125 mg total) by mouth 2 (two) times daily.   DULoxetine 30 MG capsule Commonly known as: CYMBALTA Take 30 mg by mouth 2 (two) times daily.   Entresto 24-26 MG Generic drug: sacubitril-valsartan Take 1 tablet by mouth 2 (two) times daily.   ferrous sulfate 325 (  65 FE) MG tablet Take 325 mg by mouth daily.   Fluticasone-Salmeterol 250-50 MCG/DOSE Aepb Commonly known as: ADVAIR Inhale 1 puff into the lungs 2 (two) times daily.   furosemide 40 MG tablet Commonly known as: LASIX Take 1 tablet (40 mg total) by mouth daily. Take 1 tablet (80 mg total) once daily in the morning What changed:   medication strength  how much to take  how to take this  when to take this   guaiFENesin 600 MG 12 hr tablet Commonly known as: MUCINEX Take 1 tablet (600 mg total) by mouth 2 (two) times daily for 7 days.   hydrOXYzine 25 MG tablet Commonly  known as: ATARAX/VISTARIL Take 25 mg by mouth every 6 (six) hours as needed (allergy symptoms).   isosorbide mononitrate 30 MG 24 hr tablet Commonly known as: IMDUR Take 1 tablet (30 mg total) by mouth daily.   ketotifen 0.025 % ophthalmic solution Commonly known as: ZADITOR Place 1 drop into Jennings eyes 2 (two) times daily as needed.   lactulose 10 GM/15ML solution Commonly known as: CHRONULAC Take 30 g by mouth every 12 (twelve) hours as needed for mild constipation.   lamoTRIgine 25 MG tablet Commonly known as: LAMICTAL Take 25 mg by mouth 2 (two) times daily.   lidocaine 5 % Commonly known as: LIDODERM Cut 1 patch in half and apply to amputated stump once daily 12 hours on, 12 hours off   liver oil-zinc oxide 40 % ointment Commonly known as: DESITIN Apply 1 application topically at bedtime as needed for irritation. To buttocks   loperamide 2 MG capsule Commonly known as: IMODIUM Take by mouth.   LORazepam 0.5 MG tablet Commonly known as: ATIVAN Take 0.5 mg by mouth 2 (two) times daily as needed for anxiety.   melatonin 3 MG Tabs tablet Take 1 tablet by mouth at bedtime.   metFORMIN 500 MG tablet Commonly known as: GLUCOPHAGE Take 500 mg by mouth 2 (two) times daily with a meal. What changed: Another medication with the same name was removed. Continue taking this medication, and follow the directions you see here.   montelukast 10 MG tablet Commonly known as: SINGULAIR Take 10 mg by mouth at bedtime.   nicotine 14 mg/24hr patch Commonly known as: NICODERM CQ - dosed in mg/24 hours Place 14 mg onto the skin daily.   nitroGLYCERIN 0.4 MG SL tablet Commonly known as: NITROSTAT Place 0.4 mg under the tongue every 5 (five) minutes as needed for chest pain.   pantoprazole 40 MG tablet Commonly known as: PROTONIX Take 40 mg by mouth daily.   potassium chloride SA 20 MEQ tablet Commonly known as: KLOR-CON Take 1 tablet (20 mEq total) by mouth  daily.   predniSONE 20 MG tablet Commonly known as: DELTASONE Take 2 tablets (40 mg total) by mouth daily with breakfast for 4 days. Start taking on: October 26, 2020   PreserVision AREDS 2 Caps Take 1 capsule by mouth 2 (two) times daily.   Rivaroxaban 15 MG Tabs tablet Commonly known as: XARELTO Take 15 mg by mouth daily.   senna 8.6 MG tablet Commonly known as: SENOKOT Take 2 tablets by mouth at bedtime.   sodium bicarbonate 650 MG tablet Take 1 tablet (650 mg total) by mouth 2 (two) times daily.   tamsulosin 0.4 MG Caps capsule Commonly known as: FLOMAX Take 0.4 mg by mouth daily.   traZODone 50 MG tablet Commonly known as: DESYREL Take 25 mg by mouth at bedtime  as needed for sleep.   triamcinolone 0.1 % Commonly known as: KENALOG Apply 1 application topically 2 (two) times daily.            Relevant Imaging Results:  Relevant Lab Results:   Additional Information ssn  [id number]  Trenton Founds, RN

## 2020-11-07 ENCOUNTER — Other Ambulatory Visit: Payer: Self-pay

## 2020-11-07 ENCOUNTER — Encounter: Payer: Self-pay | Admitting: *Deleted

## 2020-11-07 ENCOUNTER — Emergency Department: Payer: Medicare Other

## 2020-11-07 DIAGNOSIS — Z7982 Long term (current) use of aspirin: Secondary | ICD-10-CM | POA: Insufficient documentation

## 2020-11-07 DIAGNOSIS — F17210 Nicotine dependence, cigarettes, uncomplicated: Secondary | ICD-10-CM | POA: Insufficient documentation

## 2020-11-07 DIAGNOSIS — N179 Acute kidney failure, unspecified: Secondary | ICD-10-CM | POA: Diagnosis not present

## 2020-11-07 DIAGNOSIS — E860 Dehydration: Secondary | ICD-10-CM | POA: Diagnosis not present

## 2020-11-07 DIAGNOSIS — J441 Chronic obstructive pulmonary disease with (acute) exacerbation: Secondary | ICD-10-CM | POA: Diagnosis not present

## 2020-11-07 DIAGNOSIS — E1122 Type 2 diabetes mellitus with diabetic chronic kidney disease: Secondary | ICD-10-CM | POA: Insufficient documentation

## 2020-11-07 DIAGNOSIS — N1831 Chronic kidney disease, stage 3a: Secondary | ICD-10-CM | POA: Diagnosis not present

## 2020-11-07 DIAGNOSIS — Z7951 Long term (current) use of inhaled steroids: Secondary | ICD-10-CM | POA: Diagnosis not present

## 2020-11-07 DIAGNOSIS — Z7901 Long term (current) use of anticoagulants: Secondary | ICD-10-CM | POA: Insufficient documentation

## 2020-11-07 DIAGNOSIS — Z20822 Contact with and (suspected) exposure to covid-19: Secondary | ICD-10-CM | POA: Insufficient documentation

## 2020-11-07 DIAGNOSIS — I2510 Atherosclerotic heart disease of native coronary artery without angina pectoris: Secondary | ICD-10-CM | POA: Insufficient documentation

## 2020-11-07 DIAGNOSIS — Z7984 Long term (current) use of oral hypoglycemic drugs: Secondary | ICD-10-CM | POA: Insufficient documentation

## 2020-11-07 DIAGNOSIS — Z951 Presence of aortocoronary bypass graft: Secondary | ICD-10-CM | POA: Diagnosis not present

## 2020-11-07 DIAGNOSIS — Z79899 Other long term (current) drug therapy: Secondary | ICD-10-CM | POA: Diagnosis not present

## 2020-11-07 DIAGNOSIS — I4891 Unspecified atrial fibrillation: Secondary | ICD-10-CM | POA: Insufficient documentation

## 2020-11-07 DIAGNOSIS — I129 Hypertensive chronic kidney disease with stage 1 through stage 4 chronic kidney disease, or unspecified chronic kidney disease: Secondary | ICD-10-CM | POA: Diagnosis not present

## 2020-11-07 DIAGNOSIS — R079 Chest pain, unspecified: Secondary | ICD-10-CM | POA: Diagnosis present

## 2020-11-07 LAB — CBC
HCT: 36.6 % — ABNORMAL LOW (ref 39.0–52.0)
Hemoglobin: 11.7 g/dL — ABNORMAL LOW (ref 13.0–17.0)
MCH: 27.7 pg (ref 26.0–34.0)
MCHC: 32 g/dL (ref 30.0–36.0)
MCV: 86.7 fL (ref 80.0–100.0)
Platelets: 265 10*3/uL (ref 150–400)
RBC: 4.22 MIL/uL (ref 4.22–5.81)
RDW: 14.8 % (ref 11.5–15.5)
WBC: 10.4 10*3/uL (ref 4.0–10.5)
nRBC: 0 % (ref 0.0–0.2)

## 2020-11-07 LAB — BASIC METABOLIC PANEL
Anion gap: 15 (ref 5–15)
BUN: 36 mg/dL — ABNORMAL HIGH (ref 8–23)
CO2: 22 mmol/L (ref 22–32)
Calcium: 9.1 mg/dL (ref 8.9–10.3)
Chloride: 103 mmol/L (ref 98–111)
Creatinine, Ser: 1.81 mg/dL — ABNORMAL HIGH (ref 0.61–1.24)
GFR, Estimated: 38 mL/min — ABNORMAL LOW (ref >60)
Glucose, Bld: 147 mg/dL — ABNORMAL HIGH (ref 70–99)
Potassium: 4.5 mmol/L (ref 3.5–5.1)
Sodium: 140 mmol/L (ref 135–145)

## 2020-11-07 LAB — TROPONIN I (HIGH SENSITIVITY)
Troponin I (High Sensitivity): 44 ng/L — ABNORMAL HIGH (ref <18)
Troponin I (High Sensitivity): 45 ng/L — ABNORMAL HIGH (ref <18)

## 2020-11-07 NOTE — ED Triage Notes (Signed)
Pt brought in via ems in a wheelchair to triage.  Pt is from [facility].  Pt reports chest pain for 1 hour.  Reports pain in center of chest.  Pt denies n/v   Pt has intermittent sob.  cig smoker.  Pt alert  Speech clear.

## 2020-11-07 NOTE — ED Triage Notes (Signed)
Pt to ED via [place] with c/o CP that started approx 1 hr ago. Per [facility] pt in A-fib with a controlled rate of 80-90  80-90 A-fib  100/60 4L Chronic via [place]  CP 7/10

## 2020-11-08 ENCOUNTER — Emergency Department
Admission: EM | Admit: 2020-11-08 | Discharge: 2020-11-08 | Disposition: A | Discharge status: Home / Self Care | Payer: Medicare Other | Attending: Emergency Medicine | Admitting: Emergency Medicine

## 2020-11-08 DIAGNOSIS — E860 Dehydration: Secondary | ICD-10-CM

## 2020-11-08 DIAGNOSIS — N179 Acute kidney failure, unspecified: Secondary | ICD-10-CM

## 2020-11-08 DIAGNOSIS — J441 Chronic obstructive pulmonary disease with (acute) exacerbation: Secondary | ICD-10-CM | POA: Diagnosis not present

## 2020-11-08 DIAGNOSIS — R079 Chest pain, unspecified: Secondary | ICD-10-CM

## 2020-11-08 LAB — POC SARS CORONAVIRUS 2 AG -  ED: SARS Coronavirus 2 Ag: NEGATIVE

## 2020-11-08 MED ORDER — IPRATROPIUM-ALBUTEROL 0.5-2.5 (3) MG/3ML IN SOLN
3.0000 mL | Freq: Once | RESPIRATORY_TRACT | Status: AC
  Administered 2020-11-08: 3 mL via RESPIRATORY_TRACT
  Filled 2020-11-08: qty 3

## 2020-11-08 MED ORDER — PREDNISONE 20 MG PO TABS
60.0000 mg | ORAL_TABLET | Freq: Once | ORAL | Status: AC
  Administered 2020-11-08: 60 mg via ORAL
  Filled 2020-11-08: qty 3

## 2020-11-08 MED ORDER — PREDNISONE 20 MG PO TABS: ORAL_TABLET | ORAL | 0 refills | Status: DC

## 2020-11-08 MED ORDER — SODIUM CHLORIDE 0.9 % IV BOLUS
1000.0000 mL | Freq: Once | INTRAVENOUS | Status: AC
  Administered 2020-11-08: 1000 mL via INTRAVENOUS

## 2020-11-08 NOTE — ED Provider Notes (Signed)
[facility] Emergency Department Provider Note   ____________________________________________   Event Date/Time   First MD Initiated Contact with Patient 11/08/20 [phone]     (approximate)  I have reviewed the triage vital signs and the nursing notes.   HISTORY  Chief Complaint Chest Pain    HPI Shawn Jennings is a 75 y.o. male brought to the ED via EMS from [place] with a chief complaint of chest pain.  Patient has a history of CAD, COPD on 4L continuous oxygen, CKD, GERD, ischemic cardiomyopathy, PAF who reports chest pain x1 day in the center of his chest.  Describes nonradiating "sore" sensation not associated with diaphoresis, palpitations, nausea/vomiting.  Has intermittent shortness of breath and feels like he has been wheezing and needs a breathing treatment.  Denies fever, chills, increased cough from baseline, abdominal pain, dysuria or diarrhea.  Hospitalized 10/17/2020-10/25/2020 for ascending cholangitis.     Past Medical History:  Diagnosis Date  . Aortic stenosis    a. Pt unaware of history but CT imaging 01/2019 consistent w/ AVR; b. 01/2019 Echo: Mild to mod AS. Mean grad 12mmHg.  Marland Kitchen CAD (coronary artery disease)    a. 1994 s/p CABG ([place]); b. Reports multiple stress tests over the years w/o repeat cath; c. 01/2019 MV: large, sev, fixed inf and inflat defect extending to apex. No ischemia. EF 37%-->Med rx given atypical Ss and pt wishes.  . CKD (chronic kidney disease), stage III ([place])   . COPD (chronic obstructive pulmonary disease) ([place])   . Depression   . Essential hypertension   . GERD (gastroesophageal reflux disease)   . Hyperlipidemia LDL goal <70   . Ischemic cardiomyopathy    a. 01/2019 Echo: EF 40-45%, impaired relaxation. Mildly dil LA. Sev mitral annular Ca2+. Mild to mod AS.  Marland Kitchen PAD (peripheral artery disease) ([place])    a. 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  Marland Kitchen PAF (paroxysmal atrial fibrillation) ([facility])    a. CHA2DS2VASc = 4-->Xarelto 15mg   daily in setting of CKD.  Marland Kitchen Thyrotoxicosis   . Tobacco abuse   . Type II diabetes mellitus [facility])     Patient Active Problem List   Diagnosis Date Noted  . Cholangitis   . Protein-calorie malnutrition, severe 10/18/2020  . Choledocholithiasis 10/17/2020  . Coronary artery disease of native artery of native heart with stable angina pectoris ([place])   . Ischemic cardiomyopathy   . Other chest pain 03/10/2020  . Chronic kidney disease, stage 3a ([place]) 03/09/2020  . Essential hypertension 03/09/2020  . Chest pain 01/27/2019  . Renal insufficiency   . Nonspecific chest pain 01/26/2019    Past Surgical History:  Procedure Laterality Date  . CARDIAC CATHETERIZATION    . CHOLECYSTECTOMY    . CORONARY ARTERY BYPASS GRAFT     a. [place] CHOLANGIOPANCREATOGRAPHY (ERCP) WITH PROPOFOL N/A 10/19/2020   Procedure: ENDOSCOPIC RETROGRADE CHOLANGIOPANCREATOGRAPHY (ERCP) WITH PROPOFOL;  Surgeon: [name], MD;  Location: [facility] ENDOSCOPY;  Service: Endoscopy;  Laterality: N/A;  . Left AKA    . RIGHT/LEFT HEART CATH AND CORONARY ANGIOGRAPHY N/A 06/06/2020   Procedure: RIGHT/LEFT HEART CATH AND CORONARY ANGIOGRAPHY;  Surgeon: Wellington Hampshire, MD;  Location: [facility] CV LAB;  Service: Cardiovascular;  Laterality: N/A;    Prior to Admission medications   Medication Sig Start Date End Date Taking? Authorizing Provider  predniSONE (DELTASONE) 20 MG tablet 3 tablets daily x 4 days 11/08/20  Yes Paulette Blanch, MD  acetaminophen (  TYLENOL) 500 MG tablet Take 500 mg by mouth every 6 (six) hours as needed.    [provider]  albuterol (PROVENTIL) (2.5 MG/3ML) 0.083% nebulizer solution Take 2.5 mg by nebulization every 4 (four) hours as needed for wheezing or shortness of breath.    [provider]  albuterol (VENTOLIN HFA) 108 (90 Base) MCG/ACT inhaler Inhale 2 puffs into the lungs every 6 (six) hours as needed for wheezing or shortness of breath.      [provider]  aspirin EC 81 MG tablet Take 81 mg by mouth daily.    [provider]  atorvastatin (LIPITOR) 80 MG tablet Take 80 mg by mouth at bedtime.    [provider]  carvedilol (COREG) 3.125 MG tablet Take 1 tablet (3.125 mg total) by mouth 2 (two) times daily. 10/25/20 12/24/20  Shelly Coss, MD  DULoxetine (CYMBALTA) 30 MG capsule Take 30 mg by mouth 2 (two) times daily.    [provider]  ferrous sulfate 325 (65 FE) MG tablet Take 325 mg by mouth daily.    [provider]  Fluticasone-Salmeterol (ADVAIR) 250-50 MCG/DOSE AEPB Inhale 1 puff into the lungs 2 (two) times daily.    [provider]  furosemide (LASIX) 40 MG tablet Take 1 tablet (40 mg total) by mouth daily. Take 1 tablet (80 mg total) once daily in the morning 10/25/20   Shelly Coss, MD  hydrOXYzine (ATARAX/VISTARIL) 25 MG tablet Take 25 mg by mouth every 6 (six) hours as needed (allergy symptoms).    [provider]  isosorbide mononitrate (IMDUR) 30 MG 24 hr tablet Take 1 tablet (30 mg total) by mouth daily. 03/11/20 05/27/29  Sharen Hones, MD  ketotifen (ZADITOR) 0.025 % ophthalmic solution Place 1 drop into both eyes 2 (two) times daily as needed.     [provider]  lactulose (CHRONULAC) 10 GM/15ML solution Take 30 g by mouth every 12 (twelve) hours as needed for mild constipation.    [provider]  lamoTRIgine (LAMICTAL) 25 MG tablet Take 25 mg by mouth 2 (two) times daily.    [provider]  lidocaine (LIDODERM) 5 % Cut 1 patch in half and apply to amputated stump once daily 12 hours on, 12 hours off    [provider]  liver oil-zinc oxide (DESITIN) 40 % ointment Apply 1 application topically at bedtime as needed for irritation. To buttocks     [provider]  loperamide (IMODIUM) 2 MG capsule Take by mouth. 03/31/20   [provider]  LORazepam (ATIVAN) 0.5 MG tablet Take 0.5 mg by mouth 2 (two)  times daily as needed for anxiety.    [provider]  Melatonin 3 MG TABS Take 1 tablet by mouth at bedtime.    [provider]  metFORMIN (GLUCOPHAGE) 500 MG tablet Take 500 mg by mouth 2 (two) times daily with a meal.     [provider]  montelukast (SINGULAIR) 10 MG tablet Take 10 mg by mouth at bedtime.    [provider]  Multiple Vitamins-Minerals (PRESERVISION AREDS 2) CAPS Take 1 capsule by mouth 2 (two) times daily.    [provider]  nicotine (NICODERM CQ - DOSED IN MG/24 HOURS) 14 mg/24hr patch Place 14 mg onto the skin daily.    [provider]  nitroGLYCERIN (NITROSTAT) 0.4 MG SL tablet Place 0.4 mg under the tongue every 5 (five) minutes as needed for chest pain.    [provider]  pantoprazole (PROTONIX) 40 MG tablet Take 40 mg by mouth daily.    [provider]  potassium chloride SA (KLOR-CON) 20 MEQ tablet Take 1 tablet (20 mEq total) by mouth daily. 10/25/20 01/23/21  Shelly Coss, MD  Rivaroxaban (XARELTO) 15 MG TABS tablet Take 15 mg by mouth daily.    [provider]  sacubitril-valsartan (ENTRESTO) 24-26 MG Take 1 tablet by mouth 2 (two) times daily. 05/27/20   Loel Dubonnet, NP  senna (SENOKOT) 8.6 MG tablet Take 2 tablets by mouth at bedtime.    [provider]  sodium bicarbonate 650 MG tablet Take 1 tablet (650 mg total) by mouth 2 (two) times daily. 10/25/20   Shelly Coss, MD  tamsulosin (FLOMAX) 0.4 MG CAPS capsule Take 0.4 mg by mouth daily.    [provider]  traZODone (DESYREL) 50 MG tablet Take 25 mg by mouth at bedtime as needed for sleep.    [provider]  triamcinolone cream (KENALOG) 0.1 % Apply 1 application topically 2 (two) times daily.    [provider]    Allergies Patient has no known allergies.  Family History  Problem Relation Age of Onset  . Heart attack Mother        died @ 70.  . Other Father        never knew his  father.  . Other Sister        overdose of sleeping pills.  . Heart disease Brother        died in his 90's  . Other Sister        complication of abd surgery @ 19  . CAD Sister     Social History Social History   Tobacco Use  . Smoking status: Current Every Day Smoker    Packs/day: 1.00    Years: 62.00    Pack years: 62.00  . Smokeless tobacco: Never Used  Vaping Use  . Vaping Use: Never used  Substance Use Topics  . Alcohol use: Never  . Drug use: Never    Review of Systems  Constitutional: No fever/chills Eyes: No visual changes. ENT: No sore throat. Cardiovascular: Positive for chest pain. Respiratory: Positive for shortness of breath. Gastrointestinal: No abdominal pain.  No nausea, no vomiting.  No diarrhea.  No constipation. Genitourinary: Negative for dysuria. Musculoskeletal: Negative for back pain. Skin: Negative for rash. Neurological: Negative for headaches, focal weakness or numbness.   ____________________________________________   PHYSICAL EXAM:  VITAL SIGNS: ED Triage Vitals  Enc Vitals Group     BP 11/07/20 1524 100/62     Pulse Rate 11/07/20 1524 87     Resp 11/07/20 1524 20     Temp 11/07/20 1524 98.3 F (36.8 C)     Temp Source 11/07/20 1524 Oral     SpO2 11/07/20 1524 99 %     Weight 11/07/20 1527 168 lb (76.2 kg)     Height 11/07/20 1527 6' (1.829 m)     Head Circumference --      Peak Flow --      Pain Score 11/07/20 1527 7     Pain Loc --      Pain Edu? --      Excl. in [place]? --     Constitutional: Alert and oriented.  Chronically ill appearing and in no acute distress. Eyes: Conjunctivae are normal. PERRL. EOMI. Head: Atraumatic. Nose: No congestion/rhinnorhea. Mouth/Throat: Mucous membranes are mildly dry.   Neck: No stridor.   Cardiovascular: Normal  rate, regular rhythm. Grossly normal heart sounds.  Good peripheral circulation. Respiratory: Normal respiratory effort.  No retractions. Lungs slightly diminished  bibasilarly. Gastrointestinal: Soft and nontender to light or deep palpation. No distention. No abdominal bruits. No CVA tenderness. Musculoskeletal: No lower extremity tenderness nor edema.  No joint effusions. Neurologic:  Normal speech and language. No gross focal neurologic deficits are appreciated.  Skin:  Skin is warm, dry and intact. No rash noted. Psychiatric: Mood and affect are normal. Speech and behavior are normal.  ____________________________________________   LABS (all labs ordered are listed, but only abnormal results are displayed)  Labs Reviewed  BASIC METABOLIC PANEL - Abnormal; Notable for the following components:      Result Value   Glucose, Bld 147 (*)    BUN 36 (*)    Creatinine, Ser 1.81 (*)    GFR, Estimated 38 (*)    All other components within normal limits  CBC - Abnormal; Notable for the following components:   Hemoglobin 11.7 (*)    HCT 36.6 (*)    All other components within normal limits  TROPONIN I (HIGH SENSITIVITY) - Abnormal; Notable for the following components:   Troponin I (High Sensitivity) 44 (*)    All other components within normal limits  TROPONIN I (HIGH SENSITIVITY) - Abnormal; Notable for the following components:   Troponin I (High Sensitivity) 45 (*)    All other components within normal limits  POC SARS CORONAVIRUS 2 AG -  ED   ____________________________________________  EKG  ED ECG REPORT I, Antonio Creswell J, the attending physician, personally viewed and interpreted this ECG.   Date: 11/08/2020  EKG Time: 1526  Rate: 93  Rhythm: normal EKG, normal sinus rhythm  Axis: Normal  Intervals:none  ST&T Change: Nonspecific  ____________________________________________  RADIOLOGY I, Asuncion Shibata J, personally viewed and evaluated these images (plain radiographs) as part of my medical decision making, as well as reviewing the written report by the radiologist.  ED MD interpretation: COPD, resolution of previous left lung  infiltrate  Official radiology report(s): DG Chest 2 View  Result Date: 11/07/2020 CLINICAL DATA:  Chest pain centrally for 1 hour, intermittent shortness of breath, history chronic kidney disease, coronary artery disease, paroxysmal atrial fibrillation, diabetes mellitus, smoker, COPD, GERD EXAM: CHEST - 2 VIEW COMPARISON:  10/21/2020 FINDINGS: Normal heart size post median sternotomy, CABG, and TAVR. Atherosclerotic calcification and mild tortuosity of thoracic aorta. Mediastinal contours and pulmonary vascularity normal. Lungs hyperinflated with minimal central peribronchial thickening consistent with COPD. No acute infiltrate, pleural effusion, or pneumothorax. LEFT lung infiltrates seen on prior exam resolved. Diffuse osseous demineralization. IMPRESSION: COPD changes with resolution of previously identified LEFT lung infiltrate. No acute abnormalities. Aortic Atherosclerosis (ICD10-I70.0) and Emphysema (ICD10-J43.9). Electronically Signed   By: Lavonia Dana M.D.   On: 11/07/2020 16:13    ____________________________________________   PROCEDURES  Procedure(s) performed (including Critical Care):  Procedures   ____________________________________________   INITIAL IMPRESSION / ASSESSMENT AND PLAN / ED COURSE  As part of my medical decision making, I reviewed the following data within the [place] notes reviewed and incorporated, Labs reviewed, EKG interpreted, Old chart reviewed, Radiograph reviewed and Notes from prior ED visits     75 year old male presenting with chest pain Differential diagnosis includes, but is not limited to, ACS, aortic dissection, pulmonary embolism, cardiac tamponade, pneumothorax, pneumonia, pericarditis, myocarditis, GI-related causes including esophagitis/gastritis, and musculoskeletal chest wall pain.    2 sets of stable troponins, AKI, chest x-ray improved from  prior Will initiate IV fluid hydration, steroid, DuoNeb.  Clinical  Course as of 11/08/20 [id number]  Tue Nov 08, 2020  0451 Patient feeling better after nebulizer treatment.  Will discharge back to his facility prednisone burst.  Strict return precautions given.  Patient verbalizes understanding agrees with plan of care. [JS]    Clinical Course User Index [JS] Paulette Blanch, MD     ____________________________________________   FINAL CLINICAL IMPRESSION(S) / ED DIAGNOSES  Final diagnoses:  Nonspecific chest pain  Chronic obstructive pulmonary disease with acute exacerbation ([place])  AKI (acute kidney injury) ([place])  Dehydration     ED Discharge Orders         Ordered    predniSONE (DELTASONE) 20 MG tablet        11/08/20 0450          *Please note:  Eamonn Woolard was evaluated in Emergency Department on 11/08/2020 for the symptoms described in the history of present illness. He was evaluated in the context of the global COVID-19 pandemic, which necessitated consideration that the patient might be at risk for infection with the SARS-CoV-2 virus that causes COVID-19. Institutional protocols and algorithms that pertain to the evaluation of patients at risk for COVID-19 are in a state of rapid change based on information released by regulatory bodies including the CDC and federal and state organizations. These policies and algorithms were followed during the patient's care in the ED.  Some ED evaluations and interventions may be delayed as a result of limited staffing during and the pandemic.*   Note:  This document was prepared using Dragon voice recognition software and may include unintentional dictation errors.   Paulette Blanch, MD 11/08/20 [phone]

## 2020-11-08 NOTE — ED Notes (Signed)
Pt called out asking for help. Pt said he was wet. Tech asked pt after she changed him if he could use  Urinal. Pt said im not using that thing. Tech asked pt why not and pt said that tech wasn't listening. Pt did not tell why he couldn't use urinal. Pt requested a catheter. Tech changed bedding and placed a suction catheter to pt with help of [employer].

## 2020-11-08 NOTE — ED Notes (Signed)
Report given to [employer] at [facility] [facility].

## 2020-11-08 NOTE — Discharge Instructions (Signed)
1. Take Prednisone 60mg  daily x4 days. Start your next dose Wednesday morning. 2. Drink plenty of fluids daily. 3. Return to the ER for worsening symptoms, persistent vomiting, difficulty breathing or other concerns

## 2020-11-13 IMAGING — RF DG ESOPHAGUS
4 series · 6 of 6 positions shown · non-contrast
Comparison: No prior.

CLINICAL DATA: Esophageal stenosis.

EXAM:
ESOPHOGRAM/BARIUM SWALLOW
TECHNIQUE: Single contrast examination was performed using  thin barium.
FLUOROSCOPY TIME:  Fluoroscopy Time:  0 minutes 48 seconds
Radiation Exposure Index (if provided by the fluoroscopic device):
8.3 mGy

[Series 1: fluoro_barium 2fps_bw · 0.18mm/px · 3 of 8 frames shown (1 of 3)]
[frame 2/8]
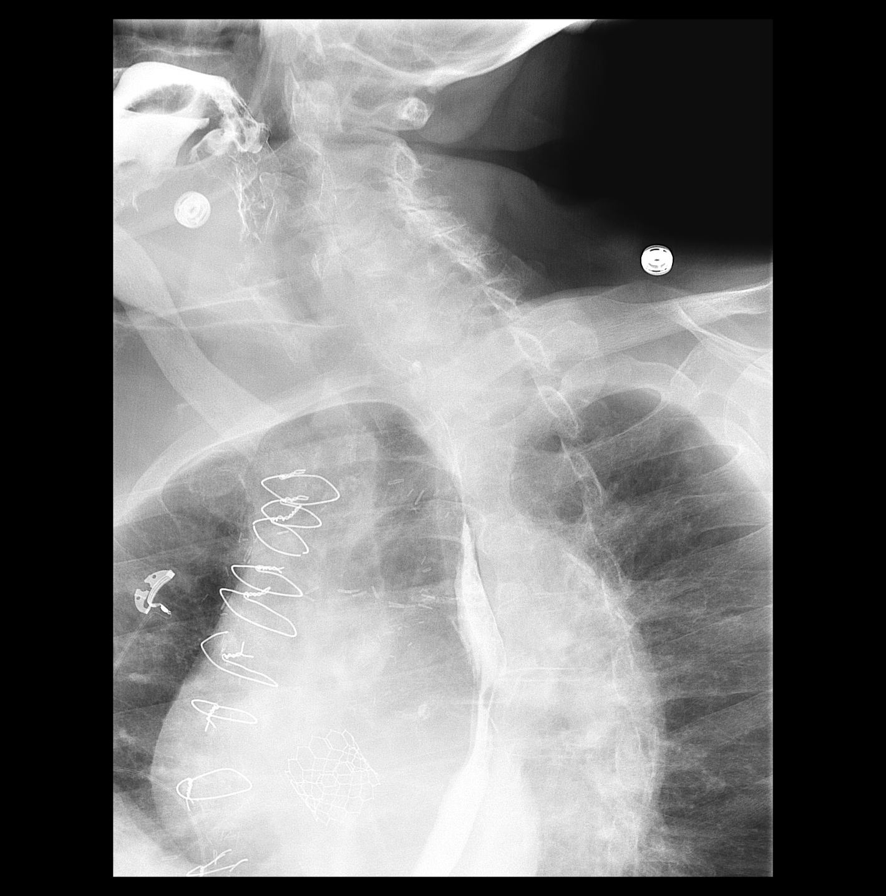
[frame 5/8]
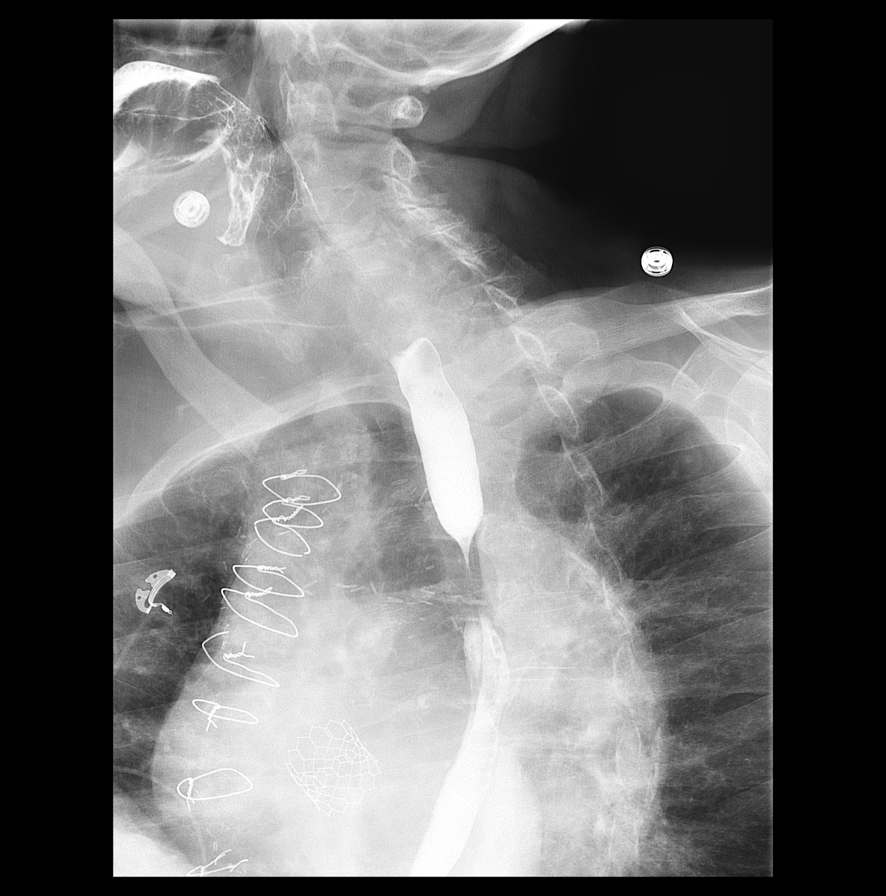
[frame 7/8]
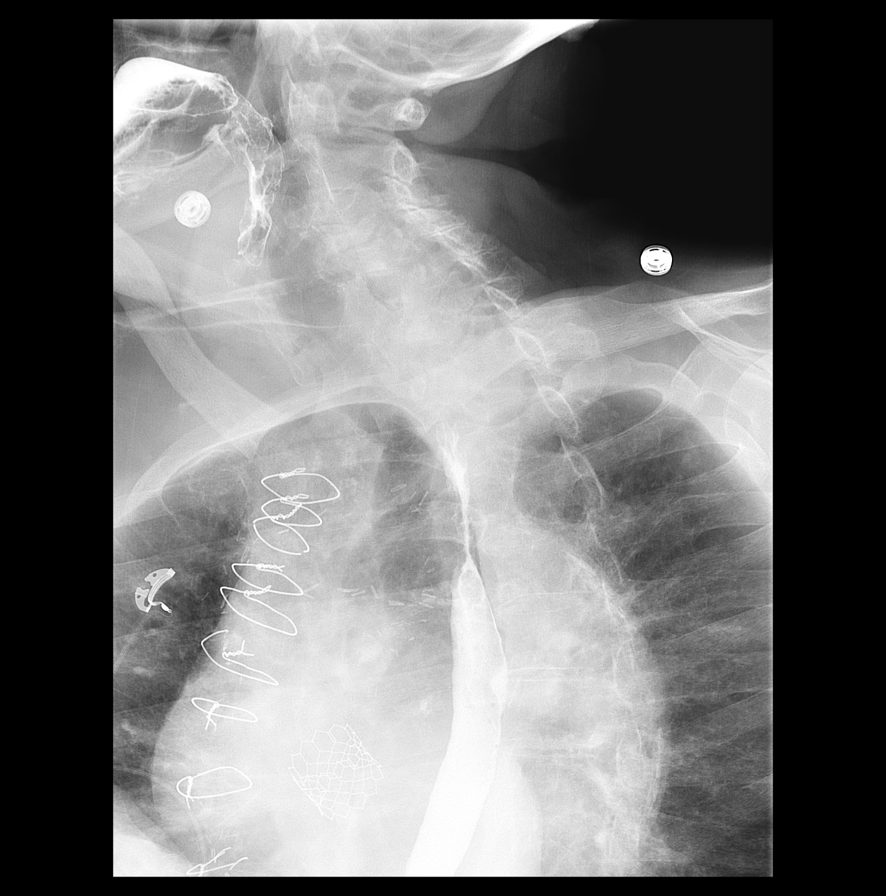

[Series 2: fluoro_barium 2fps_bw · 0.18mm/px · 1 of 1 slices shown (2 of 3)]
[im 1/1]
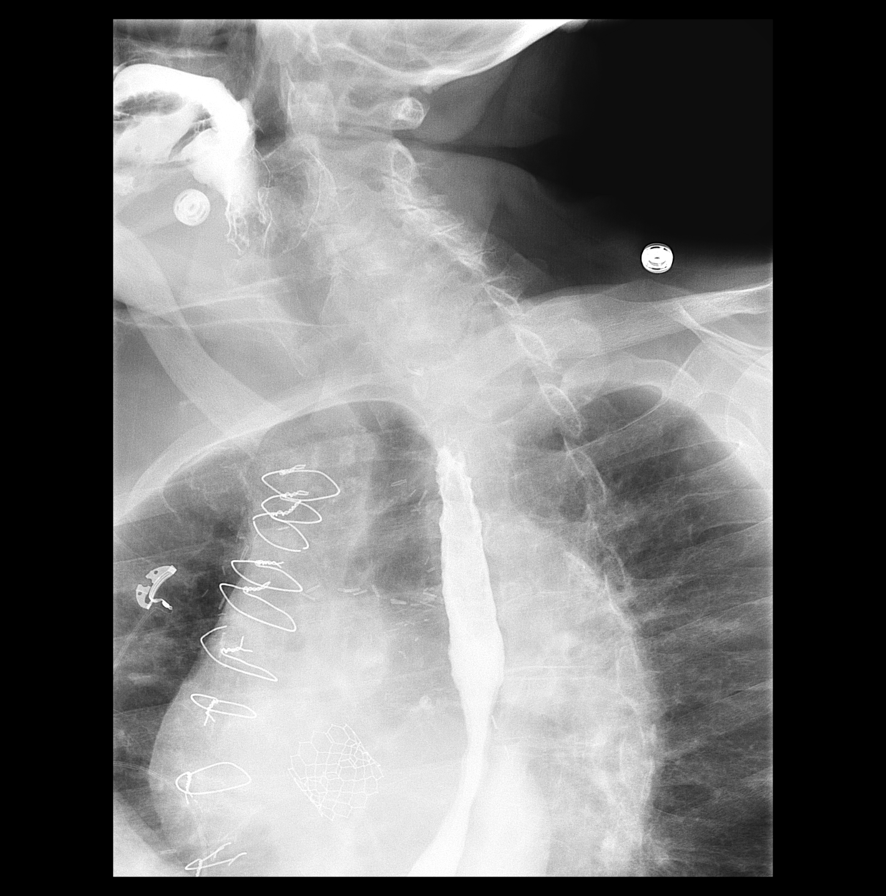

[Series 3: fluoro_barium 2fps_bw · 0.18mm/px · 1 of 1 slices shown (3 of 3)]
[im 1/1]
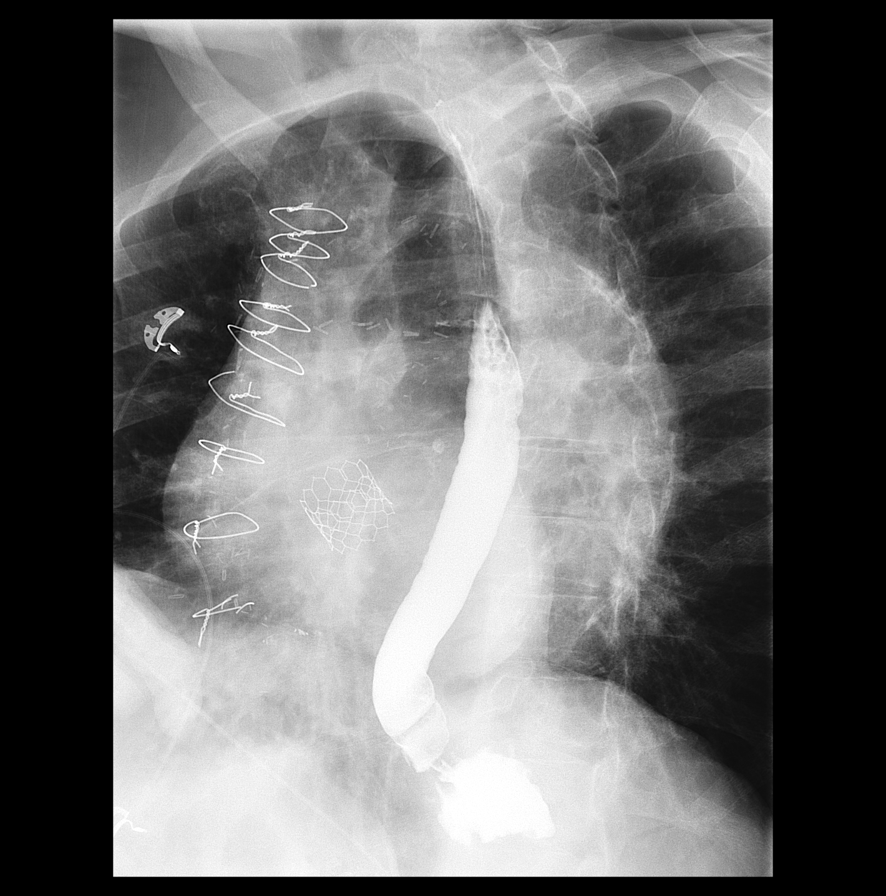

[Series 4: cp_standard · 0.28mm/px · 1 of 1 slices shown]
[im 1/1]
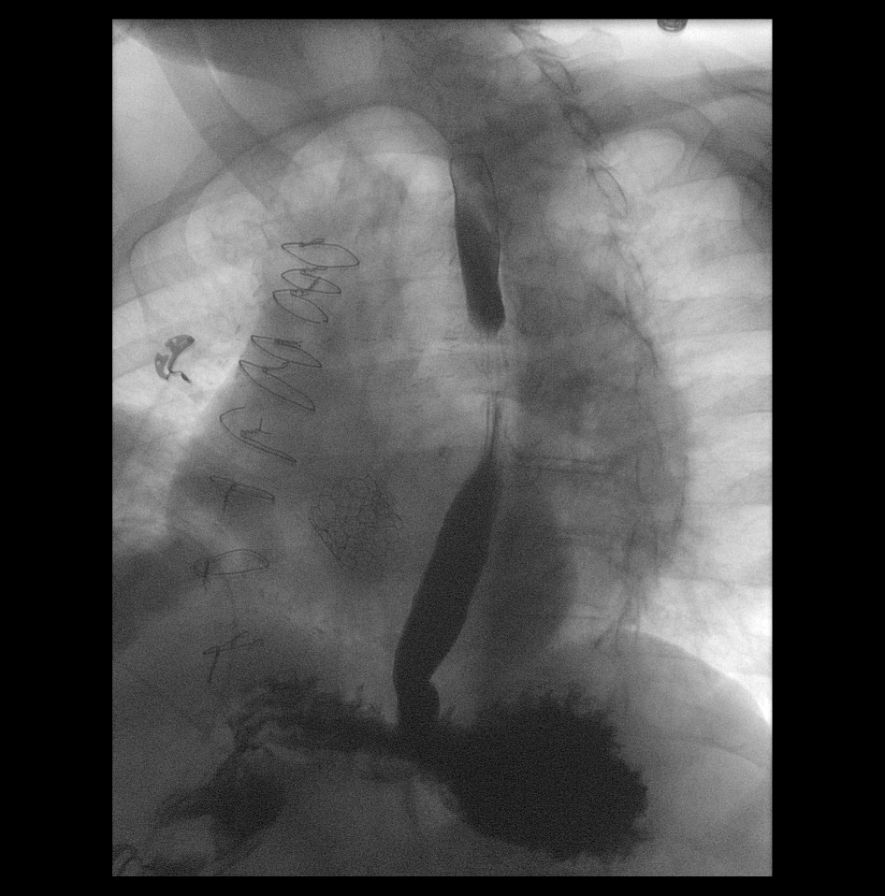

[6 of 6 positions shown; findings below may reference images not displayed]

FINDINGS: Mild prominence of the cricopharyngeus fold. Thoracic esophagus is
widely patent. Small sliding hiatal hernia. Prior CABG and cardiac
valve replacement. No prominent reflux noted.
IMPRESSION: Mild prominence of the cricopharyngeus fold. Small sliding hiatal
hernia. No significant esophageal stenosis noted. No obstructing
abnormality.

## 2020-12-08 ENCOUNTER — Encounter: Payer: Self-pay | Admitting: Gastroenterology

## 2020-12-08 ENCOUNTER — Other Ambulatory Visit: Payer: Self-pay

## 2020-12-08 ENCOUNTER — Ambulatory Visit (INDEPENDENT_AMBULATORY_CARE_PROVIDER_SITE_OTHER): Payer: Medicare Other | Admitting: Gastroenterology

## 2020-12-08 VITALS — BP 105/61 | HR 84 | Temp 97.9°F | Ht 72.0 in

## 2020-12-08 DIAGNOSIS — K8050 Calculus of bile duct without cholangitis or cholecystitis without obstruction: Secondary | ICD-10-CM | POA: Diagnosis not present

## 2020-12-08 NOTE — Progress Notes (Signed)
Primary Care Physician: [facility]  Primary Gastroenterologist:  Dr. [name]  No chief complaint on file.   HPI: Shawn Jennings is a 75 y.o. male here for follow-up after having an ERCP with stone extraction and a stent placed.  The patient has been doing well without any complaints.  The patient is here for evaluation prior to having his stent removed.  The ERCP was back in September while the patient was in the ICU.   Past Medical History:  Diagnosis Date  . Aortic stenosis    a. Pt unaware of history but CT imaging 01/2019 consistent w/ AVR; b. 01/2019 Echo: Mild to mod AS. Mean grad 56mmHg.  Marland Kitchen CAD (coronary artery disease)    a. 1994 s/p CABG ([place]); b. Reports multiple stress tests over the years w/o repeat cath; c. 01/2019 MV: large, sev, fixed inf and inflat defect extending to apex. No ischemia. EF 37%-->Med rx given atypical Ss and pt wishes.  . CKD (chronic kidney disease), stage III ([place])   . COPD (chronic obstructive pulmonary disease) ([place])   . Depression   . Essential hypertension   . GERD (gastroesophageal reflux disease)   . Hyperlipidemia LDL goal <70   . Ischemic cardiomyopathy    a. 01/2019 Echo: EF 40-45%, impaired relaxation. Mildly dil LA. Sev mitral annular Ca2+. Mild to mod AS.  Marland Kitchen PAD (peripheral artery disease) ([place])    a. 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  Marland Kitchen PAF (paroxysmal atrial fibrillation) (HCC)    a. CHA2DS2VASc = 4-->Xarelto 15mg  daily in setting of CKD.  Marland Kitchen Thyrotoxicosis   . Tobacco abuse   . Type II diabetes mellitus ([place])     Current Outpatient Medications  Medication Sig Dispense Refill  . acetaminophen (TYLENOL) 500 MG tablet Take 500 mg by mouth every 6 (six) hours as needed.    Marland Kitchen albuterol (PROVENTIL) (2.5 MG/3ML) 0.083% nebulizer solution Take 2.5 mg by nebulization every 4 (four) hours as needed for wheezing or shortness of breath.    Marland Kitchen albuterol (VENTOLIN HFA) 108 (90 Base) MCG/ACT inhaler Inhale 2 puffs into the lungs every 6  (six) hours as needed for wheezing or shortness of breath.     Marland Kitchen aspirin EC 81 MG tablet Take 81 mg by mouth daily.    Marland Kitchen atorvastatin (LIPITOR) 80 MG tablet Take 80 mg by mouth at bedtime.    . carvedilol (COREG) 3.125 MG tablet Take 1 tablet (3.125 mg total) by mouth 2 (two) times daily. 60 tablet 1  . DULoxetine (CYMBALTA) 30 MG capsule Take 30 mg by mouth 2 (two) times daily.    . ferrous sulfate 325 (65 FE) MG tablet Take 325 mg by mouth daily.    . Fluticasone-Salmeterol (ADVAIR) 250-50 MCG/DOSE AEPB Inhale 1 puff into the lungs 2 (two) times daily.    . furosemide (LASIX) 40 MG tablet Take 1 tablet (40 mg total) by mouth daily. Take 1 tablet (80 mg total) once daily in the morning 30 tablet 1  . hydrOXYzine (ATARAX/VISTARIL) 25 MG tablet Take 25 mg by mouth every 6 (six) hours as needed (allergy symptoms).    . isosorbide mononitrate (IMDUR) 30 MG 24 hr tablet Take 1 tablet (30 mg total) by mouth daily. 30 tablet 0  . ketotifen (ZADITOR) 0.025 % ophthalmic solution Place 1 drop into both eyes 2 (two) times daily as needed.     . lactulose (CHRONULAC) 10 GM/15ML solution Take 30 g by mouth every 12 (twelve) hours as  needed for mild constipation.    Marland Kitchen lamoTRIgine (LAMICTAL) 25 MG tablet Take 25 mg by mouth 2 (two) times daily.    Marland Kitchen lidocaine (LIDODERM) 5 % Cut 1 patch in half and apply to amputated stump once daily 12 hours on, 12 hours off    . liver oil-zinc oxide (DESITIN) 40 % ointment Apply 1 application topically at bedtime as needed for irritation. To buttocks     . loperamide (IMODIUM) 2 MG capsule Take by mouth.    Marland Kitchen LORazepam (ATIVAN) 0.5 MG tablet Take 0.5 mg by mouth 2 (two) times daily as needed for anxiety.    . Melatonin 3 MG TABS Take 1 tablet by mouth at bedtime.    . metFORMIN (GLUCOPHAGE) 500 MG tablet Take 500 mg by mouth 2 (two) times daily with a meal.     . montelukast (SINGULAIR) 10 MG tablet Take 10 mg by mouth at bedtime.    . Multiple Vitamins-Minerals (PRESERVISION  AREDS 2) CAPS Take 1 capsule by mouth 2 (two) times daily.    . nicotine (NICODERM CQ - DOSED IN MG/24 HOURS) 14 mg/24hr patch Place 14 mg onto the skin daily.    . nitroGLYCERIN (NITROSTAT) 0.4 MG SL tablet Place 0.4 mg under the tongue every 5 (five) minutes as needed for chest pain.    . pantoprazole (PROTONIX) 40 MG tablet Take 40 mg by mouth daily.    . potassium chloride SA (KLOR-CON) 20 MEQ tablet Take 1 tablet (20 mEq total) by mouth daily. 90 tablet 1  . predniSONE (DELTASONE) 20 MG tablet 3 tablets daily x 4 days 12 tablet 0  . Rivaroxaban (XARELTO) 15 MG TABS tablet Take 15 mg by mouth daily.    . sacubitril-valsartan (ENTRESTO) 24-26 MG Take 1 tablet by mouth 2 (two) times daily. 60 tablet 6  . senna (SENOKOT) 8.6 MG tablet Take 2 tablets by mouth at bedtime.    . sodium bicarbonate 650 MG tablet Take 1 tablet (650 mg total) by mouth 2 (two) times daily. 60 tablet 0  . tamsulosin (FLOMAX) 0.4 MG CAPS capsule Take 0.4 mg by mouth daily.    . traZODone (DESYREL) 50 MG tablet Take 25 mg by mouth at bedtime as needed for sleep.    Marland Kitchen triamcinolone cream (KENALOG) 0.1 % Apply 1 application topically 2 (two) times daily.     No current facility-administered medications for this visit.    Allergies as of 12/08/2020  . (No Known Allergies)    ROS:  General: Negative for anorexia, weight loss, fever, chills, fatigue, weakness. ENT: Negative for hoarseness, difficulty swallowing , nasal congestion. CV: Negative for chest pain, angina, palpitations, dyspnea on exertion, peripheral edema.  Respiratory: Negative for dyspnea at rest, dyspnea on exertion, cough, sputum, wheezing.  GI: See history of present illness. GU:  Negative for dysuria, hematuria, urinary incontinence, urinary frequency, nocturnal urination.  Endo: Negative for unusual weight change.    Physical Examination:   BP 105/61   Pulse 84   Temp 97.9 F (36.6 C) (Oral)   Ht 6' (1.829 m)   BMI 22.78 kg/m   General:  Well-nourished, well-developed in no acute distress.  Eyes: No icterus. Conjunctivae pink. Extremities: Left leg above-knee amputation Neuro: Alert and oriented x 3.  Grossly intact. Skin: Warm and dry, no jaundice.   Psych: Alert and cooperative, normal mood and affect.  Labs:    Imaging Studies: No results found.  Assessment and Plan:   Shawn Jennings is a 75  y.o. y/o male Who comes in today after being seen in the hospital for an ERCP with a stone extraction and stent placed due to a large periampullar diverticulum.  The patient will be set up for repeat ERCP with stent removal.  The patient has been explained the plan and agrees with it.  The patient will followed up in the time of the procedure.     [name], MD. Marval Regal    Note: This dictation was prepared with Dragon dictation along with smaller phrase technology. Any transcriptional errors that result from this process are unintentional.

## 2020-12-09 ENCOUNTER — Other Ambulatory Visit: Payer: Self-pay

## 2020-12-09 DIAGNOSIS — K8050 Calculus of bile duct without cholangitis or cholecystitis without obstruction: Secondary | ICD-10-CM

## 2020-12-21 ENCOUNTER — Ambulatory Visit: Payer: Medicare Other | Admitting: Family

## 2020-12-23 ENCOUNTER — Other Ambulatory Visit: Payer: Self-pay

## 2020-12-23 ENCOUNTER — Other Ambulatory Visit
Admission: RE | Admit: 2020-12-23 | Discharge: 2020-12-23 | Disposition: A | Discharge status: Home / Self Care | Payer: Medicare Other | Source: Ambulatory Visit | Attending: Gastroenterology | Admitting: Gastroenterology

## 2020-12-23 DIAGNOSIS — Z20822 Contact with and (suspected) exposure to covid-19: Secondary | ICD-10-CM | POA: Diagnosis not present

## 2020-12-23 DIAGNOSIS — Z01812 Encounter for preprocedural laboratory examination: Secondary | ICD-10-CM | POA: Diagnosis present

## 2020-12-23 LAB — SARS CORONAVIRUS 2 (TAT 6-24 HRS): SARS Coronavirus 2: NEGATIVE

## 2020-12-26 ENCOUNTER — Telehealth: Payer: Self-pay | Admitting: Family

## 2020-12-26 ENCOUNTER — Telehealth: Payer: Self-pay | Admitting: Gastroenterology

## 2020-12-26 ENCOUNTER — Other Ambulatory Visit: Payer: Self-pay

## 2020-12-26 NOTE — Telephone Encounter (Signed)
° °  Primary Cardiologist: [name], MD  Chart reviewed as part of pre-operative protocol coverage. Pharmacy clearance only. Per pharmacy review and office protocols, [name] may hold Xarelto 1-2 days prior to the planned procedure. Per GI, 2 day hold is preferred by their office.   His procedure has been rescheduled for 12/29/20. As such, his last day taking Xarelto will be 3/7. He will HOLD Xarelto on 3/8, 3/9. It will be resumed at the direction of Dr. Allen Norris.  I will route this recommendation to the requesting party via Epic fax function and remove from pre-op pool.   Please call with questions.  I spoke with staff, Lupita Dawn, at [facility] and reviewed these instructions. I will fax a signed copy of this note to [facility] at [phone].   Loel Dubonnet, NP 12/26/2020, 5:03 PM

## 2020-12-26 NOTE — Telephone Encounter (Signed)
     Request for surgical clearance:  1. What type of surgery is being performed? ERCP   2. When is this surgery scheduled? 12/27/20   3. Are there any medications that need to be held prior to surgery and how long? Xarelto   4. Name of physician performing surgery? Dr. Allen Norris   5. What is the office phone and fax number?    ____________________________________________________   Received note from Bromide at GI regarding clearance to hold Xarelto. Will route to preop pharmacy team for review. Surgery date scheduled as such due to transportation with [facility].   11/07/20 lab work: Creatinine 1.81, GFR 38

## 2020-12-26 NOTE — Telephone Encounter (Signed)
Roxanna from [facility] called to reschedule this patient's procedure.   Please call her back at [phone]

## 2020-12-26 NOTE — Telephone Encounter (Signed)
Patient with diagnosis of afib on Xarleto for anticoagulation.    Procedure: ERCP Date of procedure: 12/27/20  CHA2DS2-VASc Score = 6  This indicates a 9.7% annual risk of stroke. The patient's score is based upon: CHF History: Yes HTN History: Yes Diabetes History: Yes Stroke History: No Vascular Disease History: Yes Age Score: 2 Gender Score: 0     CrCl 37 ml/min  Per office protocol, patient can hold Xarelto for 1-2 days prior to procedure.    Please make sure pt has not taken today since procedure is tomorow

## 2020-12-26 NOTE — Telephone Encounter (Signed)
Pt rescheduled for 12/29/20.

## 2020-12-27 NOTE — Telephone Encounter (Signed)
See clearance.

## 2020-12-28 ENCOUNTER — Encounter: Payer: Self-pay | Admitting: Gastroenterology

## 2020-12-29 ENCOUNTER — Ambulatory Visit
Admission: RE | Admit: 2020-12-29 | Discharge: 2020-12-29 | Disposition: A | Discharge status: Home / Self Care | Payer: Medicare Other | Attending: Gastroenterology | Admitting: Gastroenterology

## 2020-12-29 ENCOUNTER — Ambulatory Visit: Payer: Medicare Other

## 2020-12-29 ENCOUNTER — Ambulatory Visit: Payer: Medicare Other | Admitting: Anesthesiology

## 2020-12-29 ENCOUNTER — Encounter
Admission: RE | Disposition: A | Discharge status: Home / Self Care | Payer: Self-pay | Source: Home / Self Care | Attending: Gastroenterology

## 2020-12-29 ENCOUNTER — Encounter: Payer: Self-pay | Admitting: Gastroenterology

## 2020-12-29 DIAGNOSIS — Z79899 Other long term (current) drug therapy: Secondary | ICD-10-CM | POA: Diagnosis not present

## 2020-12-29 DIAGNOSIS — Z7951 Long term (current) use of inhaled steroids: Secondary | ICD-10-CM | POA: Diagnosis not present

## 2020-12-29 DIAGNOSIS — Z4659 Encounter for fitting and adjustment of other gastrointestinal appliance and device: Secondary | ICD-10-CM

## 2020-12-29 DIAGNOSIS — Z89612 Acquired absence of left leg above knee: Secondary | ICD-10-CM | POA: Insufficient documentation

## 2020-12-29 DIAGNOSIS — Z7984 Long term (current) use of oral hypoglycemic drugs: Secondary | ICD-10-CM | POA: Insufficient documentation

## 2020-12-29 DIAGNOSIS — Z951 Presence of aortocoronary bypass graft: Secondary | ICD-10-CM | POA: Insufficient documentation

## 2020-12-29 DIAGNOSIS — K8050 Calculus of bile duct without cholangitis or cholecystitis without obstruction: Secondary | ICD-10-CM

## 2020-12-29 DIAGNOSIS — F17210 Nicotine dependence, cigarettes, uncomplicated: Secondary | ICD-10-CM | POA: Insufficient documentation

## 2020-12-29 DIAGNOSIS — Z7952 Long term (current) use of systemic steroids: Secondary | ICD-10-CM | POA: Diagnosis not present

## 2020-12-29 DIAGNOSIS — Z7982 Long term (current) use of aspirin: Secondary | ICD-10-CM | POA: Diagnosis not present

## 2020-12-29 DIAGNOSIS — Z7901 Long term (current) use of anticoagulants: Secondary | ICD-10-CM | POA: Insufficient documentation

## 2020-12-29 HISTORY — PX: ERCP: SHX5425

## 2020-12-29 LAB — GLUCOSE, CAPILLARY: Glucose-Capillary: 110 mg/dL — ABNORMAL HIGH (ref 70–99)

## 2020-12-29 SURGERY — ERCP, WITH INTERVENTION IF INDICATED
Anesthesia: General

## 2020-12-29 MED ORDER — INDOMETHACIN 50 MG RE SUPP
RECTAL | Status: AC
  Filled 2020-12-29: qty 2

## 2020-12-29 MED ORDER — INDOMETHACIN 50 MG RE SUPP: 100.0000 mg | Freq: Every day | RECTAL | Status: DC

## 2020-12-29 MED ORDER — PROPOFOL 500 MG/50ML IV EMUL
INTRAVENOUS | Status: AC
  Filled 2020-12-29: qty 50

## 2020-12-29 MED ORDER — PROPOFOL 500 MG/50ML IV EMUL
INTRAVENOUS | Status: DC | PRN
  Administered 2020-12-29: 120 ug/kg/min via INTRAVENOUS

## 2020-12-29 MED ORDER — SODIUM CHLORIDE 0.9 % IV SOLN: INTRAVENOUS | Status: DC

## 2020-12-29 MED ORDER — INDOMETHACIN 50 MG RE SUPP: 100.0000 mg | Freq: Once | RECTAL | Status: DC

## 2020-12-29 MED ORDER — LIDOCAINE HCL (CARDIAC) PF 100 MG/5ML IV SOSY
PREFILLED_SYRINGE | INTRAVENOUS | Status: DC | PRN
  Administered 2020-12-29: 50 mg via INTRAVENOUS

## 2020-12-29 MED ORDER — EPHEDRINE SULFATE 50 MG/ML IJ SOLN
INTRAMUSCULAR | Status: DC | PRN
  Administered 2020-12-29 (×2): 5 mg via INTRAVENOUS

## 2020-12-29 MED ORDER — LACTATED RINGERS IV SOLN: INTRAVENOUS | Status: DC

## 2020-12-29 MED ORDER — SODIUM CHLORIDE 0.9 % IV SOLN
INTRAVENOUS | Status: DC | PRN
  Administered 2020-12-29: 8 mL

## 2020-12-29 MED ORDER — LIDOCAINE HCL (PF) 2 % IJ SOLN
INTRAMUSCULAR | Status: AC
  Filled 2020-12-29: qty 5

## 2020-12-29 NOTE — Anesthesia Preprocedure Evaluation (Signed)
Anesthesia Evaluation  Patient identified by MRN, date of birth, ID band Patient awake    Reviewed: Allergy & Precautions, H&P , NPO status , Patient's Chart, lab work & pertinent test results  History of Anesthesia Complications Negative for: history of anesthetic complications  Airway Mallampati: II  TM Distance: >3 FB     Dental  (+) Upper Dentures   Pulmonary neg shortness of breath, neg sleep apnea, COPD, Current Smoker and Patient abstained from smoking.,     + decreased breath sounds      Cardiovascular hypertension, (-) angina+ CAD, + CABG, + Peripheral Vascular Disease and +CHF  (-) dysrhythmias  Rhythm:irregular Rate:Normal     Neuro/Psych PSYCHIATRIC DISORDERS Depression negative neurological ROS     GI/Hepatic Neg liver ROS, GERD  ,Acute pancreatitis Obstructing common bile duct stone requiring ERCP   Endo/Other  diabetes  Renal/GU CRF and ARFRenal disease     Musculoskeletal   Abdominal   Peds  Hematology negative hematology ROS (+)   Anesthesia Other Findings Known ischemic CHF with reduced EF. Mildly elevated troponins consistent with demand ischemia, no drastic upward trend. No notable EKG changes. ICU not concerned about cardiac etiology, declined to get a cardiac consult. Echo results from today pending. Sepsis secondary to infected CBD stone, requiring low dose NE infusion. Has been receiving IVF. Per GI, ERCP is urgent and should not be further delayed for cardiac clearance. Pt states his CP and abdominal pain have resolved and he is feeling "much better."  Past Medical History: No date: Aortic stenosis     Comment:  a. Pt unaware of history but CT imaging 01/2019               consistent w/ AVR; b. 01/2019 Echo: Mild to mod AS. Mean               grad 67mmHg. No date: CAD (coronary artery disease)     Comment:  a. 1994 s/p CABG ([place]); b. Reports multiple               stress tests over  the years w/o repeat cath; c. 01/2019               MV: large, sev, fixed inf and inflat defect extending to               apex. No ischemia. EF 37%-->Med rx given atypical Ss and               pt wishes. No date: CKD (chronic kidney disease), stage III (HCC) No date: COPD (chronic obstructive pulmonary disease) (HCC) No date: Depression No date: Essential hypertension No date: GERD (gastroesophageal reflux disease) No date: Hyperlipidemia LDL goal <70 No date: Ischemic cardiomyopathy     Comment:  a. 01/2019 Echo: EF 40-45%, impaired relaxation. Mildly               dil LA. Sev mitral annular Ca2+. Mild to mod AS. No date: PAD (peripheral artery disease) ([place])     Comment:  a. 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 No date: PAF (paroxysmal atrial fibrillation) (HCC)     Comment:  a. CHA2DS2VASc = 4-->Xarelto 15mg  daily in setting of               CKD. No date: Thyrotoxicosis No date: Tobacco abuse No date: Type II diabetes mellitus ([place])  Past Surgical History: No date: CARDIAC CATHETERIZATION No date: CHOLECYSTECTOMY No date: CORONARY ARTERY BYPASS GRAFT  Comment:  a. [place] No date: Left AKA 06/06/2020: RIGHT/LEFT HEART CATH AND CORONARY ANGIOGRAPHY; N/A     Comment:  Procedure: RIGHT/LEFT HEART CATH AND CORONARY               ANGIOGRAPHY;  Surgeon: Wellington Hampshire, MD;  Location:               [place] [facility];  Service: Cardiovascular;                Laterality: N/A;  BMI    Body Mass Index: 21.70 kg/m      Reproductive/Obstetrics negative OB ROS                             Anesthesia Physical  Anesthesia Plan  ASA: III  Anesthesia Plan: General ETT and Modified Rapid Sequence   Post-op Pain Management:    Induction: Intravenous  PONV Risk Score and Plan: Treatment may vary due to age or medical condition, TIVA and Propofol infusion  Airway Management Planned: Natural Airway and Nasal Cannula  Additional Equipment:   Intra-op Plan:    Post-operative Plan:   Informed Consent: I have reviewed the patients History and Physical, chart, labs and discussed the procedure including the risks, benefits and alternatives for the proposed anesthesia with the patient or authorized representative who has indicated his/her understanding and acceptance.     Dental Advisory Given  Plan Discussed with: Anesthesiologist, CRNA and Surgeon  Anesthesia Plan Comments:         Anesthesia Quick Evaluation

## 2020-12-29 NOTE — Anesthesia Procedure Notes (Signed)
Performed by: Hedda Slade, CRNA Pre-anesthesia Checklist: Patient identified, Emergency Drugs available, Suction available, Patient being monitored and Timeout performed Patient Re-evaluated:Patient Re-evaluated prior to induction Oxygen Delivery Method: Nasal cannula Preoxygenation: Pre-oxygenation with 100% oxygen Induction Type: IV induction Airway Equipment and Method: Bite block Placement Confirmation: positive ETCO2 and CO2 detector

## 2020-12-29 NOTE — Anesthesia Postprocedure Evaluation (Signed)
Anesthesia Post Note  Patient: Aeric Burnham  Procedure(s) Performed: ENDOSCOPIC RETROGRADE CHOLANGIOPANCREATOGRAPHY (ERCP) (N/A )  Patient location during evaluation: Endoscopy Anesthesia Type: General Level of consciousness: awake and alert Pain management: pain level controlled Vital Signs Assessment: post-procedure vital signs reviewed and stable Respiratory status: spontaneous breathing, nonlabored ventilation, respiratory function stable and patient connected to nasal cannula oxygen Cardiovascular status: blood pressure returned to baseline and stable Postop Assessment: no apparent nausea or vomiting Anesthetic complications: no   No complications documented.   Last Vitals:  Vitals:   12/29/20 1251 12/29/20 1301  BP: (!) 138/91 (!) 149/78  Pulse: 84 85  Resp: 19 (!) 25  Temp:    SpO2: 94% 93%    Last Pain:  Vitals:   12/29/20 1301  TempSrc:   PainSc: 0-No pain                 Martha Clan

## 2020-12-29 NOTE — H&P (Signed)
Shawn Lame, MD [name] Shawn Jennings,  [address] Phone:[phone] Fax : [phone]  Primary Care Physician:  [facility] Primary Gastroenterologist:  Dr. Allen Norris  Pre-Procedure History & Physical: HPI:  Shawn Jennings is a 75 y.o. male is here for an ERCP.   Past Medical History:  Diagnosis Date  . Aortic stenosis    a. Pt unaware of history but CT imaging 01/2019 consistent w/ AVR; b. 01/2019 Echo: Mild to mod AS. Mean grad 104mmHg.  Marland Kitchen CAD (coronary artery disease)    a. 1994 s/p CABG ([place]); b. Reports multiple stress tests over the years w/o repeat cath; c. 01/2019 MV: large, sev, fixed inf and inflat defect extending to apex. No ischemia. EF 37%-->Med rx given atypical Ss and pt wishes.  . CKD (chronic kidney disease), stage III ([place])   . COPD (chronic obstructive pulmonary disease) ([place])   . Depression   . Essential hypertension   . GERD (gastroesophageal reflux disease)   . Hyperlipidemia LDL goal <70   . Ischemic cardiomyopathy    a. 01/2019 Echo: EF 40-45%, impaired relaxation. Mildly dil LA. Sev mitral annular Ca2+. Mild to mod AS.  Marland Kitchen PAD (peripheral artery disease) ([place])    a. 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  Marland Kitchen PAF (paroxysmal atrial fibrillation) (HCC)    a. CHA2DS2VASc = 4-->Xarelto 15mg  daily in setting of CKD.  Marland Kitchen Thyrotoxicosis   . Tobacco abuse   . Type II diabetes mellitus ([place])     Past Surgical History:  Procedure Laterality Date  . CARDIAC CATHETERIZATION    . CHOLECYSTECTOMY    . CORONARY ARTERY BYPASS GRAFT     a. [place] CHOLANGIOPANCREATOGRAPHY (ERCP) WITH PROPOFOL N/A 10/19/2020   Procedure: ENDOSCOPIC RETROGRADE CHOLANGIOPANCREATOGRAPHY (ERCP) WITH PROPOFOL;  Surgeon: Shawn Lame, MD;  Location: [facility] ENDOSCOPY;  Service: Endoscopy;  Laterality: N/A;  . Left AKA    . RIGHT/LEFT HEART CATH AND CORONARY ANGIOGRAPHY N/A 06/06/2020   Procedure: RIGHT/LEFT HEART CATH AND CORONARY ANGIOGRAPHY;   Surgeon: Wellington Hampshire, MD;  Location: [place] [facility];  Service: Cardiovascular;  Laterality: N/A;    Prior to Admission medications   Medication Sig Start Date End Date Taking? Authorizing Provider  acetaminophen (TYLENOL) 500 MG tablet Take 500 mg by mouth every 6 (six) hours as needed.   Yes [provider]  albuterol (PROVENTIL) (2.5 MG/3ML) 0.083% nebulizer solution Take 2.5 mg by nebulization every 4 (four) hours as needed for wheezing or shortness of breath.   Yes [provider]  albuterol (VENTOLIN HFA) 108 (90 Base) MCG/ACT inhaler Inhale 2 puffs into the lungs every 6 (six) hours as needed for wheezing or shortness of breath.    Yes [provider]  aspirin EC 81 MG tablet Take 81 mg by mouth daily.   Yes [provider]  atorvastatin (LIPITOR) 80 MG tablet Take 80 mg by mouth at bedtime.   Yes [provider]  DULoxetine (CYMBALTA) 30 MG capsule Take 30 mg by mouth 2 (two) times daily.   Yes [provider]  ferrous sulfate 325 (65 FE) MG tablet Take 325 mg by mouth daily.   Yes [provider]  Fluticasone-Salmeterol (ADVAIR) 250-50 MCG/DOSE AEPB Inhale 1 puff into the lungs 2 (two) times daily.   Yes [provider]  furosemide (LASIX) 40 MG tablet Take 1 tablet (40 mg total) by mouth daily. Take 1 tablet (80 mg total) once daily in the  morning 10/25/20  Yes Shelly Coss, MD  isosorbide mononitrate (IMDUR) 30 MG 24 hr tablet Take 1 tablet (30 mg total) by mouth daily. 03/11/20 05/27/29 Yes Sharen Hones, MD  ketotifen (ZADITOR) 0.025 % ophthalmic solution Place 1 drop into both eyes 2 (two) times daily as needed.    Yes [provider]  lamoTRIgine (LAMICTAL) 25 MG tablet Take 25 mg by mouth 2 (two) times daily.   Yes [provider]  lidocaine (LIDODERM) 5 % Cut 1 patch in half and apply to amputated stump once daily 12 hours on, 12 hours off   Yes [provider]  Melatonin 3 MG  TABS Take 1 tablet by mouth at bedtime.   Yes [provider]  metFORMIN (GLUCOPHAGE) 500 MG tablet Take 500 mg by mouth 2 (two) times daily with a meal.    Yes [provider]  montelukast (SINGULAIR) 10 MG tablet Take 10 mg by mouth at bedtime.   Yes [provider]  pantoprazole (PROTONIX) 40 MG tablet Take 40 mg by mouth daily.   Yes [provider]  potassium chloride SA (KLOR-CON) 20 MEQ tablet Take 1 tablet (20 mEq total) by mouth daily. 10/25/20 01/23/21 Yes Shelly Coss, MD  predniSONE (DELTASONE) 20 MG tablet 3 tablets daily x 4 days 11/08/20  Yes Paulette Blanch, MD  senna (SENOKOT) 8.6 MG tablet Take 2 tablets by mouth at bedtime.   Yes [provider]  carvedilol (COREG) 3.125 MG tablet Take 1 tablet (3.125 mg total) by mouth 2 (two) times daily. 10/25/20 12/24/20  Shelly Coss, MD  hydrOXYzine (ATARAX/VISTARIL) 25 MG tablet Take 25 mg by mouth every 6 (six) hours as needed (allergy symptoms).    [provider]  lactulose (CHRONULAC) 10 GM/15ML solution Take 30 g by mouth every 12 (twelve) hours as needed for mild constipation.    [provider]  liver oil-zinc oxide (DESITIN) 40 % ointment Apply 1 application topically at bedtime as needed for irritation. To buttocks     [provider]  loperamide (IMODIUM) 2 MG capsule Take by mouth. 03/31/20   [provider]  LORazepam (ATIVAN) 0.5 MG tablet Take 0.5 mg by mouth 2 (two) times daily as needed for anxiety.    [provider]  Multiple Vitamins-Minerals (PRESERVISION AREDS 2) CAPS Take 1 capsule by mouth 2 (two) times daily. Patient not taking: Reported on 12/29/2020    [provider]  nicotine (NICODERM CQ - DOSED IN MG/24 HOURS) 14 mg/24hr patch Place 14 mg onto the skin daily. Patient not taking: Reported on 12/29/2020    [provider]  nitroGLYCERIN (NITROSTAT) 0.4 MG SL tablet Place 0.4 mg under the tongue every 5 (five)  minutes as needed for chest pain.    [provider]  Rivaroxaban (XARELTO) 15 MG TABS tablet Take 15 mg by mouth daily.    [provider]  sacubitril-valsartan (ENTRESTO) 24-26 MG Take 1 tablet by mouth 2 (two) times daily. Patient not taking: Reported on 12/29/2020 05/27/20   Loel Dubonnet, NP  sodium bicarbonate 650 MG tablet Take 1 tablet (650 mg total) by mouth 2 (two) times daily. 10/25/20   Shelly Coss, MD  tamsulosin (FLOMAX) 0.4 MG CAPS capsule Take 0.4 mg by mouth daily. Patient not taking: Reported on 12/29/2020    [provider]  traZODone (DESYREL) 50 MG tablet Take 25 mg by mouth at bedtime as needed for sleep. Patient not taking: Reported on 12/29/2020  [provider]  triamcinolone cream (KENALOG) 0.1 % Apply 1 application topically 2 (two) times daily. Patient not taking: Reported on 12/29/2020    [provider]    Allergies as of 12/09/2020  . (No Known Allergies)    Family History  Problem Relation Age of Onset  . Heart attack Mother        died @ 46.  . Other Father        never knew his father.  . Other Sister        overdose of sleeping pills.  . Heart disease Brother        died in his 59's  . Other Sister        complication of abd surgery @ 62  . CAD Sister     Social History   Socioeconomic History  . Marital status: Single    Spouse name: Not on file  . Number of children: Not on file  . Years of education: Not on file  . Highest education level: Not on file  Occupational History  . Not on file  Tobacco Use  . Smoking status: Current Every Day Smoker    Packs/day: 1.00    Years: 62.00    Pack years: 62.00  . Smokeless tobacco: Never Used  Vaping Use  . Vaping Use: Never used  Substance and Sexual Activity  . Alcohol use: Never  . Drug use: Never  . Sexual activity: Not on file  Other Topics Concern  . Not on file  Social History Narrative   Retired from [employer].  From [place],  [place]. Moved to [place] ~ 2011 (pt initially said that he moved here in Feb 2020, but then said 9 yrs ago).   Social Determinants of Health   Financial Resource Strain: Not on file  Food Insecurity: Not on file  Transportation Needs: Not on file  Physical Activity: Not on file  Stress: Not on file  Social Connections: Not on file  Intimate Partner Violence: Not on file    Review of Systems: See HPI, otherwise negative ROS  Physical Exam: BP (!) 170/90   Pulse 84   Temp 98.3 F (36.8 C) (Temporal)   Resp 20   Ht 6' (1.829 m)   Wt 67.1 kg   SpO2 98%   BMI 20.07 kg/m  General:   Alert,  pleasant and cooperative in NAD Head:  Normocephalic and atraumatic. Neck:  Supple; no masses or thyromegaly. Lungs:  Clear throughout to auscultation.    Heart:  Regular rate and rhythm. Abdomen:  Soft, nontender and nondistended. Normal bowel sounds, without guarding, and without rebound.   Neurologic:  Alert and  oriented x4;  grossly normal neurologically.  Impression/Plan: Markian Glockner is here for an ERCP to be performed for stent removal.  Risks, benefits, limitations, and alternatives regarding  ERCP have been reviewed with the patient.  Questions have been answered.  All parties agreeable.   Shawn Lame, MD  12/29/2020, 11:29 AM

## 2020-12-29 NOTE — Op Note (Signed)
[facility] Gastroenterology Patient Name: Shawn Jennings Procedure Date: 12/29/2020 11:40 AM MRN: [id number] Account #: [id number] Date of Birth: 01-25-1946 Admit Type: Outpatient Age: 75 Room: [facility] ENDO ROOM 4 Gender: Male Note Status: Finalized Procedure:             ERCP Indications:           Stent removal Providers:             [name] MD, MD Medicines:             Propofol per Anesthesia Complications:         No immediate complications. Procedure:             Pre-Anesthesia Assessment:                        - Prior to the procedure, a History and Physical was                         performed, and patient medications and allergies were                         reviewed. The patient's tolerance of previous                         anesthesia was also reviewed. The risks and benefits                         of the procedure and the sedation options and risks                         were discussed with the patient. All questions were                         answered, and informed consent was obtained. Prior                         Anticoagulants: The patient has taken no previous                         anticoagulant or antiplatelet agents. ASA Grade                         Assessment: II - A patient with mild systemic disease.                         After reviewing the risks and benefits, the patient                         was deemed in satisfactory condition to undergo the                         procedure.                        After obtaining informed consent, the scope was passed                         under direct vision. Throughout the procedure, the  patient's blood pressure, pulse, and oxygen                         saturations were monitored continuously. The [employer] D single use duodenoscope was                         introduced through the mouth, and used to inject                          contrast into and used to inject contrast into the                         bile duct. The ERCP was accomplished without                         difficulty. The patient tolerated the procedure well. Findings:      A biliary stent was visible on the scout film. The major papilla was       located entirely within a diverticulum. One stent was removed from the       biliary tree using a snare. The biliary tree was swept with a 15 mm       balloon starting at the bifurcation. Nothing was found. Impression:            - The major papilla was located entirely within a                         diverticulum.                        - One stent was removed from the biliary tree.                        - The biliary tree was swept and nothing was found. Recommendation:        - Discharge patient to home.                        - Resume previous diet.                        - Continue present medications. Procedure Code(s):     --- Professional ---                        [phone], Endoscopic retrograde cholangiopancreatography                         (ERCP); with removal of foreign body(s) or stent(s)                         from biliary/pancreatic duct(s) Diagnosis Code(s):     --- Professional ---                        R42.70, Encounter for fitting and adjustment of other  gastrointestinal appliance and device CPT copyright 2019 [employer]. All rights reserved. The codes documented in this report are preliminary and upon coder review may  be revised to meet current compliance requirements. [name] MD, MD 12/29/2020 12:32:44 PM This report has been signed electronically. Number of Addenda: 0 Note Initiated On: 12/29/2020 11:40 AM Estimated Blood Loss:  Estimated blood loss: none.      [facility]

## 2020-12-29 NOTE — Transfer of Care (Signed)
Immediate Anesthesia Transfer of Care Note  Patient: Shawn Jennings  Procedure(s) Performed: ENDOSCOPIC RETROGRADE CHOLANGIOPANCREATOGRAPHY (ERCP) (N/A )  Patient Location: PACU  Anesthesia Type:General  Level of Consciousness: awake and sedated  Airway & Oxygen Therapy: Patient Spontanous Breathing and Patient connected to nasal cannula oxygen  Post-op Assessment: Report given to RN and Post -op Vital signs reviewed and stable  Post vital signs: Reviewed and stable  Last Vitals:  Vitals Value Taken Time  BP    Temp    Pulse    Resp    SpO2      Last Pain:  Vitals:   12/29/20 1055  TempSrc: Temporal  PainSc: 0-No pain         Complications: No complications documented.

## 2020-12-30 ENCOUNTER — Encounter: Payer: Self-pay | Admitting: Gastroenterology

## 2021-01-25 ENCOUNTER — Other Ambulatory Visit: Payer: Self-pay | Admitting: Family

## 2021-01-25 NOTE — Telephone Encounter (Signed)
Please schedule overdue 4 month F/U appointment. Thank you!

## 2021-01-26 NOTE — Telephone Encounter (Signed)
Scheduled for 4/18

## 2021-02-06 ENCOUNTER — Ambulatory Visit: Payer: Medicare Other | Admitting: Family

## 2021-02-06 NOTE — Progress Notes (Deleted)
Office Visit    Patient Name: Shawn Jennings Date of Encounter: 02/06/2021  Primary Care Provider:  [facility] Primary Cardiologist:  [name], MD Electrophysiologist:  None   Chief Complaint    Shawn Jennings is a 75 y.o. male with a hx of CAD s/p CABG in 1994, chronic chest pain, HTN, HLD, paroxysmal atrial relation, ischemic cardiomyopathy, HFrEF, ASA s/p AVR, PAD s/p left AKA, DM2, GERD, depression presents today for follow-up of HFrEF  Past Medical History    Past Medical History:  Diagnosis Date  . Aortic stenosis    a. Pt unaware of history but CT imaging 01/2019 consistent w/ AVR; b. 01/2019 Echo: Mild to mod AS. Mean grad 32mmHg.  Marland Kitchen CAD (coronary artery disease)    a. 1994 s/p CABG ([place]); b. Reports multiple stress tests over the years w/o repeat cath; c. 01/2019 MV: large, sev, fixed inf and inflat defect extending to apex. No ischemia. EF 37%-->Med rx given atypical Ss and pt wishes.  . CKD (chronic kidney disease), stage III ([place])   . COPD (chronic obstructive pulmonary disease) ([place])   . Depression   . Essential hypertension   . GERD (gastroesophageal reflux disease)   . Hyperlipidemia LDL goal <70   . Ischemic cardiomyopathy    a. 01/2019 Echo: EF 40-45%, impaired relaxation. Mildly dil LA. Sev mitral annular Ca2+. Mild to mod AS.  Marland Kitchen PAD (peripheral artery disease) ([place])    a. 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  Marland Kitchen PAF (paroxysmal atrial fibrillation) ([facility])    a. CHA2DS2VASc = 4-->Xarelto 15mg  daily in setting of CKD.  Marland Kitchen Thyrotoxicosis   . Tobacco abuse   . Type II diabetes mellitus ([place])    Past Surgical History:  Procedure Laterality Date  . CARDIAC CATHETERIZATION    . CHOLECYSTECTOMY    . CORONARY ARTERY BYPASS GRAFT     a. [place] CHOLANGIOPANCREATOGRAPHY (ERCP) WITH PROPOFOL N/A 10/19/2020   Procedure: ENDOSCOPIC RETROGRADE CHOLANGIOPANCREATOGRAPHY (ERCP) WITH PROPOFOL;  Surgeon: [name], MD;  Location:  [facility] ENDOSCOPY;  Service: Endoscopy;  Laterality: N/A;  . ERCP N/A 12/29/2020   Procedure: ENDOSCOPIC RETROGRADE CHOLANGIOPANCREATOGRAPHY (ERCP);  Surgeon: [name], MD;  Location: [facility] ENDOSCOPY;  Service: Endoscopy;  Laterality: N/A;  . Left AKA    . RIGHT/LEFT HEART CATH AND CORONARY ANGIOGRAPHY N/A 06/06/2020   Procedure: RIGHT/LEFT HEART CATH AND CORONARY ANGIOGRAPHY;  Surgeon: Wellington Hampshire, MD;  Location: [facility] LAB;  Service: Cardiovascular;  Laterality: N/A;    Allergies  No Known Allergies  History of Present Illness    Shawn Jennings is a 75 y.o. male with a hx of CAD s/p CABG in 1994, chronic chest pain, HTN, HLD, paroxysmal atrial relation, ischemic cardiomyopathy, HFrEF, ASA s/p AVR, PAD s/p left AKA, DM2, GERD, depression last seen 08/2020  CAD s/p remote CABG in [place] in 1994.  Long history of chest pain with multiple stress tests over the years.  Ad[facility] to [facility] April 2020 with chest pain.  Stress testing potentially high risk with large severe fixed inferior and inferolateral defect extends to the apex.  No ischemia noted.  EF 37%.  Echo with EF 40 to 45%.  He preferred to avoid cardiac catheterization and he had been medically managed since.  He was admitted May 2021 with recurrent atypical chest pain that was worse with deep breathing.  Troponins were normal.  He was seen by our team with recommendation for follow-up  echo which was not performed.  He underwent barium esophagram that showed small hiatal hernia.  He was discharged home Mar 11, 2020.    When seen in follow-up 03/30/20 he was recommended for echocardiogram as it had not been performed while hospitalized.  Echo 05/05/2020 showed newly reduced LVEF 25 to 30%, LV with global hypokinesis, mild LVH, grade 1 diastolic dysfunction, RV normal size and function, bioprosthetic aortic valve with normal function, borderline dilation of aortic root measuring 38 mm. Subsequent [facility]  06/06/20 with severe  underlying coronary disease with patent grafts and RHC with normal filling  Seen in clinic 06/17/20. Coreg was added for optimization of HF therapies. Lab work collected showed mild decline in renal function and he was recommended to reduce Lasix to 80mg  in the AM only and reduce Potassium to 60mEq daily.  At follow-up November 2021 he noted to be tolerating carvedilol without difficulty.  No changes were made at that time.  Since last see.  She was hospitalized in December with choledocholithiasis and septic shock due to ascending cholangitis.  Underwent ERCP and stent placement.  Hospitalization was notable for atrial flutter with RVR, AKI on CKD 4.  Seen in the ED 162/22 with chest pain with 2 sets of serial troponins, chest x-ray improved from prior.  He was treated with IV hydration, steroid, DuoNeb.  On 12/29/2020 he underwent repeat ERCP with stent removal.  He presents today for follow-up. ***  EKGs/Labs/Other Studies Reviewed:   The following studies were reviewed today:  Cardiac cath 06/06/20  Prox Cx to Mid Cx lesion is 100% stenosed.  Mid LAD lesion is 100% stenosed.  Prox RCA lesion is 100% stenosed.  LIMA graft was visualized by non-selective angiography and is normal in caliber.  The graft exhibits no disease.  RIMA graft was visualized by non-selective angiography and is normal in caliber.  The graft exhibits no disease.   1.  Severe underlying three-vessel coronary artery disease with patent grafts including LIMA to LAD, SVG Y graft to second diagonal and OM 3 and RIMA to RCA. 2.  Left ventricular angiography was not performed.  EF was severely reduced by echo. 3.  Right heart catheterization showed normal filling pressures, minimal pulmonary hypertension and severely reduced cardiac output.   Recommendations: Continue medical therapy for chronic systolic heart failure. No need for any revascularization. Continue aggressive treatment of risk factors. Xarelto can be  resumed tomorrow.  Echo 05/05/2020  1. Left ventricular ejection fraction, by estimation, is 25 to 30%. The  left ventricle has severely decreased function. The left ventricle  demonstrates global hypokinesis. There is mild left ventricular  hypertrophy. Left ventricular diastolic parameters   are consistent with Grade I diastolic dysfunction (impaired relaxation).   2. Right ventricular systolic function is normal. The right ventricular  size is normal.   3. The mitral valve is degenerative. No evidence of mitral valve  regurgitation.   4. The aortic valve has been repaired/replaced. Aortic valve  regurgitation is not visualized. There is a a bioprosthetic valve present  in the aortic position. Echo findings are consistent with normal structure  and function of the aortic valve  prosthesis.   5. Aortic dilatation noted. There is borderline dilatation of the aortic  root measuring 38 mm.   EKG:  EKG is  ordered today.EKG performed today demonstrates NSR 70 bpm with stable lateral TWI and no acute ST/T wave changes. ***  Recent Labs: 10/22/2020: Magnesium 2.0 10/23/2020: ALT 90 11/07/2020: BUN 36; Creatinine, Ser  1.81; Hemoglobin 11.7; Platelets 265; Potassium 4.5; Sodium 140  Recent Lipid Panel    Component Value Date/Time   CHOL 143 01/27/2019 0237   TRIG 152 (H) 01/27/2019 0237   HDL 51 01/27/2019 0237   CHOLHDL 2.8 01/27/2019 0237   VLDL 30 01/27/2019 0237   LDLCALC 62 01/27/2019 0237    Home Medications   No outpatient medications have been marked as taking for the 02/06/21 encounter (Appointment) with Loel Dubonnet, NP.    Review of Systems   All other systems reviewed and are otherwise negative except as noted above.  Physical Exam  *** VS:  There were no vitals taken for this visit. , BMI There is no height or weight on file to calculate BMI. GEN: Thin, well developed, in no acute distress. HEENT: normal. Neck: Supple, no JVD, carotid bruits, or masses. Cardiac:  RRR, no  rubs, or gallops. Gr 2/6 systolic murmur. No clubbing, cyanosis, edema.  Radials 2+ and equal bilaterally. RLE PT 1+ Respiratory:  Respirations regular and unlabored, clear to auscultation bilaterally. GI: Soft, nontender, nondistended. MS: No deformity or atrophy. S/p L AKA. Skin: Warm and dry, no rash.  Neuro:  Strength and sensation are intact. Psych: Normal affect.  Assessment & Plan   1. CAD -s/p CABG in 1994 and [place] with abnormal Myoview 11/2018 that he preferred medical therapy at that time. LHC 05/2020 with severe underlying coronary disease and patent grafts.  Present GDMT includes aspirin, statin, Coreg, nitrate therapy.  Continue low-sodium, heartily diet.***  2. ICM/HFrEF -01/2019 EF 40-45% ? 04/2020 EF25-30%.  L/RHC 05/2020 with severe underlying CAD with patent bypass grafts, severely depressed CO, normal LVEDP. Euvolemic on exam and asymptomatic. GDMT includes Entresto 24-26mg  BID, Lasix 80mg  QD, coreg 3.125mg  BID. Careful monitoring of renal function and BP as he has been hypotensive in the past. Reports no lightheadedness, dizziness, near syncope.  Unable to add MRA or Farxiga today due to low normal BP. ***  3. S/p AVR -Systolic murmur noted.  Echo 05/05/2020 with bioprosthetic valve functioning appropriately. Continue to monitor with periodic echo. No chest pain, shortness of breath, near syncope. ***  4. HLD, LDL goal <70 - 01/2019 LDL 62. Continue high intensity statin. ***  5. CKD III -Careful titration of diuretics and antihypertensive agents.***  6. PAF - No evidence of recurrence. Maintaining NSR by EKG today. Continue Xarelto 15 mg daily. Recent hemoglobin stable. Denies bleeding complications. ***  7. DM2 -Continue to follow with primary care. ***  Disposition: Follow up *** with Dr. Fletcher Anon or APP.   Loel Dubonnet, NP 02/06/2021, 11:12 AM

## 2021-02-13 ENCOUNTER — Ambulatory Visit: Payer: Medicare Other | Admitting: Family

## 2021-02-16 ENCOUNTER — Ambulatory Visit (INDEPENDENT_AMBULATORY_CARE_PROVIDER_SITE_OTHER): Payer: Medicare Other | Admitting: Family

## 2021-02-16 ENCOUNTER — Other Ambulatory Visit: Payer: Self-pay

## 2021-02-16 ENCOUNTER — Encounter: Payer: Self-pay | Admitting: Family

## 2021-02-16 VITALS — BP 94/60 | HR 79 | Ht 72.0 in | Wt 134.0 lb

## 2021-02-16 DIAGNOSIS — Z952 Presence of prosthetic heart valve: Secondary | ICD-10-CM

## 2021-02-16 DIAGNOSIS — I255 Ischemic cardiomyopathy: Secondary | ICD-10-CM

## 2021-02-16 DIAGNOSIS — I5020 Unspecified systolic (congestive) heart failure: Secondary | ICD-10-CM

## 2021-02-16 DIAGNOSIS — Z7901 Long term (current) use of anticoagulants: Secondary | ICD-10-CM

## 2021-02-16 DIAGNOSIS — I25118 Atherosclerotic heart disease of native coronary artery with other forms of angina pectoris: Secondary | ICD-10-CM | POA: Diagnosis not present

## 2021-02-16 NOTE — Progress Notes (Signed)
Office Visit    Patient Name: Shawn Jennings Date of Encounter: 02/16/2021  Primary Care Provider:  [facility] Primary Cardiologist:  [name], MD Electrophysiologist:  None   Chief Complaint    Shawn Jennings is a 75 y.o. male with a hx of CAD s/p CABG in 1994, chronic chest pain, HTN, HLD, paroxysmal atrial relation, ischemic cardiomyopathy, HFrEF, ASA s/p AVR, PAD s/p left AKA, DM2, GERD, depression presents today for follow-up of HFrEF  Past Medical History    Past Medical History:  Diagnosis Date  . Aortic stenosis    a. Pt unaware of history but CT imaging 01/2019 consistent w/ AVR; b. 01/2019 Echo: Mild to mod AS. Mean grad 57mmHg.  Marland Kitchen CAD (coronary artery disease)    a. 1994 s/p CABG ([place]); b. Reports multiple stress tests over the years w/o repeat cath; c. 01/2019 MV: large, sev, fixed inf and inflat defect extending to apex. No ischemia. EF 37%-->Med rx given atypical Ss and pt wishes.  . CKD (chronic kidney disease), stage III ([place])   . COPD (chronic obstructive pulmonary disease) ([place])   . Depression   . Essential hypertension   . GERD (gastroesophageal reflux disease)   . Hyperlipidemia LDL goal <70   . Ischemic cardiomyopathy    a. 01/2019 Echo: EF 40-45%, impaired relaxation. Mildly dil LA. Sev mitral annular Ca2+. Mild to mod AS.  Marland Kitchen PAD (peripheral artery disease) ([place])    a. 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  Marland Kitchen PAF (paroxysmal atrial fibrillation) ([facility])    a. CHA2DS2VASc = 4-->Xarelto 15mg  daily in setting of CKD.  Marland Kitchen Thyrotoxicosis   . Tobacco abuse   . Type II diabetes mellitus ([place])    Past Surgical History:  Procedure Laterality Date  . CARDIAC CATHETERIZATION    . CHOLECYSTECTOMY    . CORONARY ARTERY BYPASS GRAFT     a. [place] CHOLANGIOPANCREATOGRAPHY (ERCP) WITH PROPOFOL N/A 10/19/2020   Procedure: ENDOSCOPIC RETROGRADE CHOLANGIOPANCREATOGRAPHY (ERCP) WITH PROPOFOL;  Surgeon: [name], MD;  Location:  [facility] ENDOSCOPY;  Service: Endoscopy;  Laterality: N/A;  . ERCP N/A 12/29/2020   Procedure: ENDOSCOPIC RETROGRADE CHOLANGIOPANCREATOGRAPHY (ERCP);  Surgeon: [name], MD;  Location: [facility] ENDOSCOPY;  Service: Endoscopy;  Laterality: N/A;  . Left AKA    . RIGHT/LEFT HEART CATH AND CORONARY ANGIOGRAPHY N/A 06/06/2020   Procedure: RIGHT/LEFT HEART CATH AND CORONARY ANGIOGRAPHY;  Surgeon: Wellington Hampshire, MD;  Location: [place] CV LAB;  Service: Cardiovascular;  Laterality: N/A;    Allergies  No Known Allergies  History of Present Illness    Shawn Jennings is a 75 y.o. male with a hx of CAD s/p CABG in 1994, chronic chest pain, HTN, HLD, paroxysmal atrial relation, ischemic cardiomyopathy, HFrEF, ASA s/p AVR, PAD s/p left AKA, DM2, GERD, depression last seen 08/2020  CAD s/p remote CABG in [place] in 1994.  Long history of chest pain with multiple stress tests over the years.  Admitted to [facility] April 2020 with chest pain.  Stress testing potentially high risk with large severe fixed inferior and inferolateral defect extends to the apex.  No ischemia noted.  EF 37%.  Echo with EF 40 to 45%.  He preferred to avoid cardiac catheterization and he had been medically managed since.  He was admitted May 2021 with recurrent atypical chest pain that was worse with deep breathing.  Troponins were normal.  He was seen by our team with recommendation for follow-up  echo which was not performed.  He underwent barium esophagram that showed small hiatal hernia.  He was discharged home Mar 11, 2020.    When seen in follow-up 03/30/20 he was recommended for echocardiogram as it had not been performed while hospitalized.  Echo 05/05/2020 showed newly reduced LVEF 25 to 30%, LV with global hypokinesis, mild LVH, grade 1 diastolic dysfunction, RV normal size and function, bioprosthetic aortic valve with normal function, borderline dilation of aortic root measuring 38 mm. Subsequent [facility]  06/06/20 with severe  underlying coronary disease with patent grafts and RHC with normal filling  Seen in clinic 06/17/20. Coreg was added for optimization of HF therapies. Lab work collected showed mild decline in renal function and he was recommended to reduce Lasix to 80mg  in the AM only and reduce Potassium to 35mEq daily.  At follow-up November 2021 he noted to be tolerating carvedilol without difficulty.  No changes were made at that time.  Since last seen he was hospitalized in December with choledocholithiasis and septic shock due to ascending cholangitis.  Underwent ERCP and stent placement.  Hospitalization was notable for atrial flutter with RVR, AKI on CKD 4.  Seen in the ED 11/08/20 with chest pain with 2 sets of serial troponins, chest x-ray improved from prior.  He was treated with IV hydration, steroid, DuoNeb.  On 12/29/2020 he underwent repeat ERCP with stent removal.  He presents today for follow-up.  He reports feeling overall well since last seen. Reports no shortness of breath nor dyspnea on exertion. Reports no chest pain, pressure, or tightness. No edema, orthopnea, PND. Reports no palpitations.  No lightheadedness, dizziness, near-syncope, syncope.  Notes his energy level is improving. Endorses following a mostly heart healthy diet at [facility].  Overall sedentary as he is s/p left AKA and spends much of his day in the wheelchair.  We discussed some chair exercises he could do.  EKGs/Labs/Other Studies Reviewed:   The following studies were reviewed today:  Cardiac cath 06/06/20  Prox Cx to Mid Cx lesion is 100% stenosed.  Mid LAD lesion is 100% stenosed.  Prox RCA lesion is 100% stenosed.  LIMA graft was visualized by non-selective angiography and is normal in caliber.  The graft exhibits no disease.  RIMA graft was visualized by non-selective angiography and is normal in caliber.  The graft exhibits no disease.   1.  Severe underlying three-vessel coronary artery disease with patent  grafts including LIMA to LAD, SVG Y graft to second diagonal and OM 3 and RIMA to RCA. 2.  Left ventricular angiography was not performed.  EF was severely reduced by echo. 3.  Right heart catheterization showed normal filling pressures, minimal pulmonary hypertension and severely reduced cardiac output.   Recommendations: Continue medical therapy for chronic systolic heart failure. No need for any revascularization. Continue aggressive treatment of risk factors. Xarelto can be resumed tomorrow.  Echo 05/05/2020  1. Left ventricular ejection fraction, by estimation, is 25 to 30%. The  left ventricle has severely decreased function. The left ventricle  demonstrates global hypokinesis. There is mild left ventricular  hypertrophy. Left ventricular diastolic parameters   are consistent with Grade I diastolic dysfunction (impaired relaxation).   2. Right ventricular systolic function is normal. The right ventricular  size is normal.   3. The mitral valve is degenerative. No evidence of mitral valve  regurgitation.   4. The aortic valve has been repaired/replaced. Aortic valve  regurgitation is not visualized. There is a a bioprosthetic valve  present  in the aortic position. Echo findings are consistent with normal structure  and function of the aortic valve  prosthesis.   5. Aortic dilatation noted. There is borderline dilatation of the aortic  root measuring 38 mm.   EKG:  EKG is  ordered today.EKG performed today demonstrates NSR79 bpm with stable lateral TWI and occasional PVC/PAC no acute ST/T wave changes.   Recent Labs: 10/22/2020: Magnesium 2.0 10/23/2020: ALT 90 11/07/2020: BUN 36; Creatinine, Ser 1.81; Hemoglobin 11.7; Platelets 265; Potassium 4.5; Sodium 140  Recent Lipid Panel    Component Value Date/Time   CHOL 143 01/27/2019 0237   TRIG 152 (H) 01/27/2019 0237   HDL 51 01/27/2019 0237   CHOLHDL 2.8 01/27/2019 0237   VLDL 30 01/27/2019 0237   LDLCALC 62 01/27/2019 0237     Home Medications   Current Meds  Medication Sig  . acetaminophen (TYLENOL) 500 MG tablet Take 500 mg by mouth every 6 (six) hours as needed.  Marland Kitchen albuterol (PROVENTIL) (2.5 MG/3ML) 0.083% nebulizer solution Take 2.5 mg by nebulization every 4 (four) hours as needed for wheezing or shortness of breath.  Marland Kitchen albuterol (VENTOLIN HFA) 108 (90 Base) MCG/ACT inhaler Inhale 2 puffs into the lungs every 6 (six) hours as needed for wheezing or shortness of breath.   Marland Kitchen aspirin EC 81 MG tablet Take 81 mg by mouth daily.  Marland Kitchen atorvastatin (LIPITOR) 80 MG tablet Take 80 mg by mouth at bedtime.  . DULoxetine (CYMBALTA) 30 MG capsule Take 30 mg by mouth 2 (two) times daily.  . ferrous sulfate 325 (65 FE) MG tablet Take 325 mg by mouth daily.  . Fluticasone-Salmeterol (ADVAIR) 250-50 MCG/DOSE AEPB Inhale 1 puff into the lungs 2 (two) times daily.  . furosemide (LASIX) 40 MG tablet Take 1 tablet (40 mg total) by mouth daily. Take 1 tablet (80 mg total) once daily in the morning  . hydrOXYzine (ATARAX/VISTARIL) 25 MG tablet Take 25 mg by mouth every 6 (six) hours as needed (allergy symptoms).  . isosorbide mononitrate (IMDUR) 30 MG 24 hr tablet Take 1 tablet (30 mg total) by mouth daily.  Marland Kitchen ketotifen (ZADITOR) 0.025 % ophthalmic solution Place 1 drop into both eyes 2 (two) times daily as needed.   . lactulose (CHRONULAC) 10 GM/15ML solution Take 30 g by mouth every 12 (twelve) hours as needed for mild constipation.  Marland Kitchen lamoTRIgine (LAMICTAL) 25 MG tablet Take 25 mg by mouth 2 (two) times daily.  Marland Kitchen lidocaine (LIDODERM) 5 % Cut 1 patch in half and apply to amputated stump once daily 12 hours on, 12 hours off  . liver oil-zinc oxide (DESITIN) 40 % ointment Apply 1 application topically at bedtime as needed for irritation. To buttocks  . loperamide (IMODIUM) 2 MG capsule Take by mouth.  Marland Kitchen LORazepam (ATIVAN) 0.5 MG tablet Take 0.5 mg by mouth 2 (two) times daily as needed for anxiety.  . Melatonin 3 MG TABS Take 1  tablet by mouth at bedtime.  . metFORMIN (GLUCOPHAGE) 1000 MG tablet Take 1,000 mg by mouth daily with breakfast.  . montelukast (SINGULAIR) 10 MG tablet Take 10 mg by mouth at bedtime.  . nitroGLYCERIN (NITROSTAT) 0.4 MG SL tablet Place 0.4 mg under the tongue every 5 (five) minutes as needed for chest pain.  . pantoprazole (PROTONIX) 40 MG tablet Take 40 mg by mouth daily.  . predniSONE (DELTASONE) 20 MG tablet 3 tablets daily x 4 days  . Rivaroxaban (XARELTO) 15 MG TABS tablet Take 15 mg  by mouth daily.  . sacubitril-valsartan (ENTRESTO) 24-26 MG Take 1 tablet by mouth 2 (two) times daily. Please call to schedule office visit for further refills. Thank you!  . senna (SENOKOT) 8.6 MG tablet Take 2 tablets by mouth at bedtime.  . sodium bicarbonate 650 MG tablet Take 1 tablet (650 mg total) by mouth 2 (two) times daily.  . tamsulosin (FLOMAX) 0.4 MG CAPS capsule Take 0.4 mg by mouth daily.  . traZODone (DESYREL) 50 MG tablet Take 25 mg by mouth at bedtime as needed for sleep.  Marland Kitchen triamcinolone cream (KENALOG) 0.1 % Apply 1 application topically 2 (two) times daily.    Review of Systems   All other systems reviewed and are otherwise negative except as noted above.  Physical Exam   VS:  BP 94/60   Pulse 79   Ht 6' (1.829 m)   Wt 134 lb (60.8 kg)   BMI 18.17 kg/m  , BMI Body mass index is 18.17 kg/m. GEN: Thin, well developed, in no acute distress. HEENT: normal. Neck: Supple, no JVD, carotid bruits, or masses. Cardiac: RRR, no  rubs, or gallops. Gr 2/6 systolic murmur. No clubbing, cyanosis, edema.  Radials 2+ and equal bilaterally. RLE PT 1+ Respiratory:  Respirations regular and unlabored, clear to auscultation bilaterally. GI: Soft, nontender, nondistended. MS: No deformity or atrophy. S/p L AKA. Skin: Warm and dry, no rash.  Neuro:  Strength and sensation are intact. Psych: Normal affect.  Assessment & Plan   1. CAD -s/p CABG in 1994 and [place] with abnormal Myoview  11/2018 that he preferred medical therapy at that time. LHC 05/2020 with severe underlying coronary disease and patent grafts.  Present GDMT includes aspirin, statin, Coreg, nitrate therapy.  Continue low-sodium, heartily diet.  2. ICM/HFrEF -01/2019 EF 40-45% ? 04/2020 EF25-30%.  L/RHC 05/2020 with severe underlying CAD with patent bypass grafts, severely depressed CO, normal LVEDP. Euvolemic on exam and asymptomatic. GDMT includes Entresto 24-26mg  BID, Lasix 80mg  QD, coreg 3.125mg  BID. Mildly hypotensive today though asymptomatic with no lightheadedness, dizziness, near syncope.  Unable to add MRA or Farxiga today due to low normal BP.   3. S/p AVR -Systolic murmur noted.  Echo 05/05/2020 with bioprosthetic valve functioning appropriately. Continue to monitor with periodic echo. No chest pain, shortness of breath, near syncope.   4. HLD, LDL goal <70 - Continue high intensity statin.   5. CKD III -Careful titration of diuretics and antihypertensive agents.  6. PAF - No evidence of recurrence. Maintaining NSR by EKG today. Continue Xarelto 15 mg daily. Denies bleeding complications.   7. DM2 -Continue to follow with primary care.   Disposition: Follow up in 6 months with Dr. Fletcher Anon or APP.   Loel Dubonnet, NP 02/16/2021, 10:26 AM

## 2021-02-16 NOTE — Patient Instructions (Signed)
Medication Instructions:  Continue current medications.   *If you need a refill on your cardiac medications before your next appointment, please call your pharmacy*  Lab Work: None ordered today.   Testing/Procedures: Your EKG today showed normal sinus rhythm with an occasional early beat. This is very common and not of concern.   Follow-Up: At [facility], you and your health needs are our priority.  As part of our continuing mission to provide you with exceptional heart care, we have created designated Provider Care Teams.  These Care Teams include your primary Cardiologist (physician) and Advanced Practice Providers (APPs -  Physician Assistants and Nurse Practitioners) who all work together to provide you with the care you need, when you need it.  We recommend signing up for the patient portal called "MyChart".  Sign up information is provided on this After Visit Summary.  MyChart is used to connect with patients for Virtual Visits (Telemedicine).  Patients are able to view lab/test results, encounter notes, upcoming appointments, etc.  Non-urgent messages can be sent to your provider as well.   To learn more about what you can do with MyChart, go to [email or web address].    Your next appointment:   6 month(s)  The format for your next appointment:   In Person  Provider:   You may see [name], MD or one of the following Advanced Practice Providers on your designated Care Team:    Murray Hodgkins, NP  Christell Faith, PA-C  Marrianne Mood, PA-C  Cadence Kathlen Mody, [place]  Laurann Montana, NP  Other Instructions   Heart Healthy Diet Recommendations: A low-salt diet is recommended. Meats should be grilled, baked, or boiled. Avoid fried foods. Focus on lean protein sources like fish or chicken with vegetables and fruits. The American Heart Association is a Microbiologist!  American Heart Association Diet and Lifeystyle Recommendations   Exercise recommendations: The  American Heart Association recommends 150 minutes of moderate intensity exercise weekly. Try 30 minutes of moderate intensity exercise 4-5 times per week. This could include walking, jogging, or swimming.

## 2021-02-20 ENCOUNTER — Other Ambulatory Visit: Payer: Self-pay | Admitting: Family

## 2021-02-21 NOTE — Telephone Encounter (Signed)
Rx request sent to pharmacy.  

## 2021-07-15 ENCOUNTER — Inpatient Hospital Stay: Payer: Medicare Other

## 2021-07-15 ENCOUNTER — Emergency Department: Payer: Medicare Other

## 2021-07-15 ENCOUNTER — Encounter: Payer: Self-pay | Admitting: Internal Medicine

## 2021-07-15 ENCOUNTER — Inpatient Hospital Stay
Admission: EM | Admit: 2021-07-15 | Discharge: 2021-07-20 | DRG: 392 | Disposition: A | Discharge status: Nursing Home (Includes ICF) | Payer: Medicare Other | Source: Skilled Nursing Facility | Attending: Internal Medicine | Admitting: Internal Medicine

## 2021-07-15 DIAGNOSIS — D62 Acute posthemorrhagic anemia: Secondary | ICD-10-CM | POA: Diagnosis present

## 2021-07-15 DIAGNOSIS — N189 Chronic kidney disease, unspecified: Secondary | ICD-10-CM

## 2021-07-15 DIAGNOSIS — R0602 Shortness of breath: Secondary | ICD-10-CM | POA: Diagnosis present

## 2021-07-15 DIAGNOSIS — Z7984 Long term (current) use of oral hypoglycemic drugs: Secondary | ICD-10-CM

## 2021-07-15 DIAGNOSIS — J449 Chronic obstructive pulmonary disease, unspecified: Secondary | ICD-10-CM | POA: Diagnosis present

## 2021-07-15 DIAGNOSIS — K319 Disease of stomach and duodenum, unspecified: Secondary | ICD-10-CM | POA: Diagnosis present

## 2021-07-15 DIAGNOSIS — E1122 Type 2 diabetes mellitus with diabetic chronic kidney disease: Secondary | ICD-10-CM | POA: Diagnosis present

## 2021-07-15 DIAGNOSIS — Z79899 Other long term (current) drug therapy: Secondary | ICD-10-CM | POA: Diagnosis not present

## 2021-07-15 DIAGNOSIS — Z20822 Contact with and (suspected) exposure to covid-19: Secondary | ICD-10-CM | POA: Diagnosis present

## 2021-07-15 DIAGNOSIS — I350 Nonrheumatic aortic (valve) stenosis: Secondary | ICD-10-CM | POA: Diagnosis present

## 2021-07-15 DIAGNOSIS — I10 Essential (primary) hypertension: Secondary | ICD-10-CM | POA: Diagnosis present

## 2021-07-15 DIAGNOSIS — Z7901 Long term (current) use of anticoagulants: Secondary | ICD-10-CM

## 2021-07-15 DIAGNOSIS — K920 Hematemesis: Secondary | ICD-10-CM | POA: Diagnosis not present

## 2021-07-15 DIAGNOSIS — E1151 Type 2 diabetes mellitus with diabetic peripheral angiopathy without gangrene: Secondary | ICD-10-CM | POA: Diagnosis present

## 2021-07-15 DIAGNOSIS — K922 Gastrointestinal hemorrhage, unspecified: Secondary | ICD-10-CM | POA: Diagnosis present

## 2021-07-15 DIAGNOSIS — D122 Benign neoplasm of ascending colon: Secondary | ICD-10-CM | POA: Diagnosis present

## 2021-07-15 DIAGNOSIS — E785 Hyperlipidemia, unspecified: Secondary | ICD-10-CM

## 2021-07-15 DIAGNOSIS — N179 Acute kidney failure, unspecified: Secondary | ICD-10-CM | POA: Diagnosis present

## 2021-07-15 DIAGNOSIS — I480 Paroxysmal atrial fibrillation: Secondary | ICD-10-CM

## 2021-07-15 DIAGNOSIS — R933 Abnormal findings on diagnostic imaging of other parts of digestive tract: Secondary | ICD-10-CM | POA: Diagnosis not present

## 2021-07-15 DIAGNOSIS — Z89612 Acquired absence of left leg above knee: Secondary | ICD-10-CM | POA: Diagnosis not present

## 2021-07-15 DIAGNOSIS — K644 Residual hemorrhoidal skin tags: Secondary | ICD-10-CM | POA: Diagnosis present

## 2021-07-15 DIAGNOSIS — K635 Polyp of colon: Secondary | ICD-10-CM | POA: Diagnosis not present

## 2021-07-15 DIAGNOSIS — E43 Unspecified severe protein-calorie malnutrition: Secondary | ICD-10-CM | POA: Diagnosis present

## 2021-07-15 DIAGNOSIS — Z952 Presence of prosthetic heart valve: Secondary | ICD-10-CM

## 2021-07-15 DIAGNOSIS — Z8249 Family history of ischemic heart disease and other diseases of the circulatory system: Secondary | ICD-10-CM

## 2021-07-15 DIAGNOSIS — N1831 Chronic kidney disease, stage 3a: Secondary | ICD-10-CM | POA: Diagnosis present

## 2021-07-15 DIAGNOSIS — E876 Hypokalemia: Secondary | ICD-10-CM | POA: Diagnosis not present

## 2021-07-15 DIAGNOSIS — F32A Depression, unspecified: Secondary | ICD-10-CM

## 2021-07-15 DIAGNOSIS — G4700 Insomnia, unspecified: Secondary | ICD-10-CM | POA: Diagnosis present

## 2021-07-15 DIAGNOSIS — R1084 Generalized abdominal pain: Secondary | ICD-10-CM | POA: Diagnosis not present

## 2021-07-15 DIAGNOSIS — Z951 Presence of aortocoronary bypass graft: Secondary | ICD-10-CM

## 2021-07-15 DIAGNOSIS — Z9049 Acquired absence of other specified parts of digestive tract: Secondary | ICD-10-CM

## 2021-07-15 DIAGNOSIS — I9589 Other hypotension: Secondary | ICD-10-CM | POA: Diagnosis not present

## 2021-07-15 DIAGNOSIS — D696 Thrombocytopenia, unspecified: Secondary | ICD-10-CM | POA: Diagnosis present

## 2021-07-15 DIAGNOSIS — E782 Mixed hyperlipidemia: Secondary | ICD-10-CM | POA: Diagnosis not present

## 2021-07-15 DIAGNOSIS — Z7982 Long term (current) use of aspirin: Secondary | ICD-10-CM

## 2021-07-15 DIAGNOSIS — D72829 Elevated white blood cell count, unspecified: Secondary | ICD-10-CM | POA: Diagnosis present

## 2021-07-15 DIAGNOSIS — Z720 Tobacco use: Secondary | ICD-10-CM | POA: Diagnosis not present

## 2021-07-15 DIAGNOSIS — K219 Gastro-esophageal reflux disease without esophagitis: Secondary | ICD-10-CM | POA: Diagnosis present

## 2021-07-15 DIAGNOSIS — F419 Anxiety disorder, unspecified: Secondary | ICD-10-CM | POA: Diagnosis present

## 2021-07-15 DIAGNOSIS — R58 Hemorrhage, not elsewhere classified: Secondary | ICD-10-CM | POA: Diagnosis not present

## 2021-07-15 DIAGNOSIS — I959 Hypotension, unspecified: Secondary | ICD-10-CM

## 2021-07-15 DIAGNOSIS — R109 Unspecified abdominal pain: Secondary | ICD-10-CM

## 2021-07-15 DIAGNOSIS — I129 Hypertensive chronic kidney disease with stage 1 through stage 4 chronic kidney disease, or unspecified chronic kidney disease: Secondary | ICD-10-CM | POA: Diagnosis present

## 2021-07-15 DIAGNOSIS — K921 Melena: Secondary | ICD-10-CM | POA: Diagnosis not present

## 2021-07-15 DIAGNOSIS — I255 Ischemic cardiomyopathy: Secondary | ICD-10-CM | POA: Diagnosis present

## 2021-07-15 DIAGNOSIS — F17210 Nicotine dependence, cigarettes, uncomplicated: Secondary | ICD-10-CM | POA: Diagnosis present

## 2021-07-15 DIAGNOSIS — I25118 Atherosclerotic heart disease of native coronary artery with other forms of angina pectoris: Secondary | ICD-10-CM | POA: Diagnosis present

## 2021-07-15 DIAGNOSIS — D120 Benign neoplasm of cecum: Secondary | ICD-10-CM | POA: Diagnosis present

## 2021-07-15 DIAGNOSIS — Z634 Disappearance and death of family member: Secondary | ICD-10-CM

## 2021-07-15 LAB — URINALYSIS, COMPLETE (UACMP) WITH MICROSCOPIC
Bilirubin Urine: NEGATIVE
Glucose, UA: 150 mg/dL — AB
Hgb urine dipstick: NEGATIVE
Ketones, ur: NEGATIVE mg/dL
Leukocytes,Ua: NEGATIVE
Nitrite: NEGATIVE
Protein, ur: NEGATIVE mg/dL
Specific Gravity, Urine: 1.014 (ref 1.005–1.030)
pH: 5 (ref 5.0–8.0)

## 2021-07-15 LAB — HEMOGLOBIN AND HEMATOCRIT, BLOOD
HCT: 24.6 % — ABNORMAL LOW (ref 39.0–52.0)
Hemoglobin: 8.2 g/dL — ABNORMAL LOW (ref 13.0–17.0)

## 2021-07-15 LAB — APTT: aPTT: 34 seconds (ref 24–36)

## 2021-07-15 LAB — RESP PANEL BY RT-PCR (FLU A&B, COVID) ARPGX2
Influenza A by PCR: NEGATIVE
Influenza B by PCR: NEGATIVE
SARS Coronavirus 2 by RT PCR: NEGATIVE

## 2021-07-15 LAB — CBC WITH DIFFERENTIAL/PLATELET
Abs Immature Granulocytes: 0.13 10*3/uL — ABNORMAL HIGH (ref 0.00–0.07)
Abs Immature Granulocytes: 0.11 10*3/uL — ABNORMAL HIGH (ref 0.00–0.07)
Basophils Absolute: 0.1 10*3/uL (ref 0.0–0.1)
Basophils Absolute: 0.1 10*3/uL (ref 0.0–0.1)
Basophils Relative: 1 %
Basophils Relative: 1 %
Eosinophils Absolute: 0.3 10*3/uL (ref 0.0–0.5)
Eosinophils Absolute: 0.2 10*3/uL (ref 0.0–0.5)
Eosinophils Relative: 2 %
Eosinophils Relative: 2 %
HCT: 21.6 % — ABNORMAL LOW (ref 39.0–52.0)
HCT: 28.2 % — ABNORMAL LOW (ref 39.0–52.0)
Hemoglobin: 7.1 g/dL — ABNORMAL LOW (ref 13.0–17.0)
Hemoglobin: 9.6 g/dL — ABNORMAL LOW (ref 13.0–17.0)
Immature Granulocytes: 1 %
Immature Granulocytes: 1 %
Lymphocytes Relative: 12 %
Lymphocytes Relative: 15 %
Lymphs Abs: 1.9 10*3/uL (ref 0.7–4.0)
Lymphs Abs: 2.1 10*3/uL (ref 0.7–4.0)
MCH: 29.7 pg (ref 26.0–34.0)
MCH: 30.1 pg (ref 26.0–34.0)
MCHC: 32.9 g/dL (ref 30.0–36.0)
MCHC: 34 g/dL (ref 30.0–36.0)
MCV: 90.4 fL (ref 80.0–100.0)
MCV: 88.4 fL (ref 80.0–100.0)
Monocytes Absolute: 0.8 10*3/uL (ref 0.1–1.0)
Monocytes Absolute: 0.9 10*3/uL (ref 0.1–1.0)
Monocytes Relative: 5 %
Monocytes Relative: 6 %
Neutro Abs: 11.9 10*3/uL — ABNORMAL HIGH (ref 1.7–7.7)
Neutro Abs: 10.9 10*3/uL — ABNORMAL HIGH (ref 1.7–7.7)
Neutrophils Relative %: 79 %
Neutrophils Relative %: 75 %
Platelets: 212 10*3/uL (ref 150–400)
Platelets: 184 10*3/uL (ref 150–400)
RBC: 2.39 MIL/uL — ABNORMAL LOW (ref 4.22–5.81)
RBC: 3.19 MIL/uL — ABNORMAL LOW (ref 4.22–5.81)
RDW: 15.5 % (ref 11.5–15.5)
RDW: 14.2 % (ref 11.5–15.5)
WBC: 15.1 10*3/uL — ABNORMAL HIGH (ref 4.0–10.5)
WBC: 14.3 10*3/uL — ABNORMAL HIGH (ref 4.0–10.5)
nRBC: 0 % (ref 0.0–0.2)
nRBC: 0 % (ref 0.0–0.2)

## 2021-07-15 LAB — COMPREHENSIVE METABOLIC PANEL
ALT: 10 U/L (ref 0–44)
AST: 15 U/L (ref 15–41)
Albumin: 3.2 g/dL — ABNORMAL LOW (ref 3.5–5.0)
Alkaline Phosphatase: 70 U/L (ref 38–126)
Anion gap: 9 (ref 5–15)
BUN: 48 mg/dL — ABNORMAL HIGH (ref 8–23)
CO2: 22 mmol/L (ref 22–32)
Calcium: 8.7 mg/dL — ABNORMAL LOW (ref 8.9–10.3)
Chloride: 107 mmol/L (ref 98–111)
Creatinine, Ser: 1.65 mg/dL — ABNORMAL HIGH (ref 0.61–1.24)
GFR, Estimated: 43 mL/min — ABNORMAL LOW (ref >60)
Glucose, Bld: 170 mg/dL — ABNORMAL HIGH (ref 70–99)
Potassium: 4.1 mmol/L (ref 3.5–5.1)
Sodium: 138 mmol/L (ref 135–145)
Total Bilirubin: 0.6 mg/dL (ref 0.3–1.2)
Total Protein: 5.9 g/dL — ABNORMAL LOW (ref 6.5–8.1)

## 2021-07-15 LAB — CBG MONITORING, ED
Glucose-Capillary: 151 mg/dL — ABNORMAL HIGH (ref 70–99)
Glucose-Capillary: 181 mg/dL — ABNORMAL HIGH (ref 70–99)

## 2021-07-15 LAB — PROCALCITONIN: Procalcitonin: <0.1 ng/mL

## 2021-07-15 LAB — PROTIME-INR
INR: 2.2 — ABNORMAL HIGH (ref 0.8–1.2)
Prothrombin Time: 24.1 seconds — ABNORMAL HIGH (ref 11.4–15.2)

## 2021-07-15 LAB — LACTIC ACID, PLASMA: Lactic Acid, Venous: 1.9 mmol/L (ref 0.5–1.9)

## 2021-07-15 LAB — TROPONIN I (HIGH SENSITIVITY): Troponin I (High Sensitivity): 16 ng/L (ref <18)

## 2021-07-15 LAB — LIPASE, BLOOD: Lipase: 119 U/L — ABNORMAL HIGH (ref 11–51)

## 2021-07-15 LAB — ABO/RH: ABO/RH(D): B POS

## 2021-07-15 MED ORDER — PANTOPRAZOLE 80MG IVPB - SIMPLE MED
80.0000 mg | Freq: Once | INTRAVENOUS | Status: AC
  Administered 2021-07-15: 80 mg via INTRAVENOUS
  Filled 2021-07-15: qty 80

## 2021-07-15 MED ORDER — INSULIN ASPART 100 UNIT/ML IJ SOLN
0.0000 [IU] | Freq: Three times a day (TID) | INTRAMUSCULAR | Status: DC
  Administered 2021-07-15 – 2021-07-16 (×2): 2 [IU] via SUBCUTANEOUS
  Administered 2021-07-16 – 2021-07-17 (×3): 1 [IU] via SUBCUTANEOUS
  Administered 2021-07-18 – 2021-07-19 (×4): 2 [IU] via SUBCUTANEOUS
  Administered 2021-07-20: 1 [IU] via SUBCUTANEOUS
  Administered 2021-07-20: 2 [IU] via SUBCUTANEOUS
  Filled 2021-07-15 (×9): qty 1

## 2021-07-15 MED ORDER — NOREPINEPHRINE 4 MG/250ML-% IV SOLN: 0.0000 ug/min | INTRAVENOUS | Status: DC

## 2021-07-15 MED ORDER — SODIUM CHLORIDE 0.9 % IV SOLN
INTRAVENOUS | Status: DC
Start: 2021-07-15 — End: 2021-07-16

## 2021-07-15 MED ORDER — LAMOTRIGINE 25 MG PO TABS
25.0000 mg | ORAL_TABLET | Freq: Two times a day (BID) | ORAL | Status: DC
  Administered 2021-07-15 – 2021-07-19 (×7): 25 mg via ORAL
  Filled 2021-07-15 (×9): qty 1

## 2021-07-15 MED ORDER — SODIUM CHLORIDE 0.9 % IV BOLUS
1000.0000 mL | Freq: Once | INTRAVENOUS | Status: AC
  Administered 2021-07-15: 1000 mL via INTRAVENOUS

## 2021-07-15 MED ORDER — PANTOPRAZOLE INFUSION (NEW) - SIMPLE MED
8.0000 mg/h | INTRAVENOUS | Status: AC
  Administered 2021-07-15 – 2021-07-18 (×7): 8 mg/h via INTRAVENOUS
  Filled 2021-07-15 (×4): qty 100
  Filled 2021-07-15: qty 80
  Filled 2021-07-15 (×3): qty 100

## 2021-07-15 MED ORDER — ONDANSETRON HCL 4 MG/2ML IJ SOLN
4.0000 mg | Freq: Four times a day (QID) | INTRAMUSCULAR | Status: DC | PRN
Start: 2021-07-15 — End: 2021-07-20
  Administered 2021-07-15 – 2021-07-16 (×2): 4 mg via INTRAVENOUS
  Filled 2021-07-15 (×2): qty 2

## 2021-07-15 MED ORDER — SODIUM CHLORIDE 0.9 % IV BOLUS
500.0000 mL | Freq: Once | INTRAVENOUS | Status: AC
  Administered 2021-07-15: 500 mL via INTRAVENOUS

## 2021-07-15 MED ORDER — PROTHROMBIN COMPLEX CONC HUMAN 500 UNITS IV KIT
3222.0000 [IU] | PACK | Status: AC
  Administered 2021-07-15: 3222 [IU] via INTRAVENOUS
  Filled 2021-07-15: qty 3222

## 2021-07-15 MED ORDER — ATORVASTATIN CALCIUM 20 MG PO TABS
80.0000 mg | ORAL_TABLET | Freq: Every day | ORAL | Status: DC
  Administered 2021-07-15 – 2021-07-19 (×5): 80 mg via ORAL
  Filled 2021-07-15 (×5): qty 4

## 2021-07-15 MED ORDER — METFORMIN HCL 500 MG PO TABS: 1000.0000 mg | ORAL_TABLET | Freq: Every day | ORAL | Status: DC

## 2021-07-15 MED ORDER — LORAZEPAM 0.5 MG PO TABS: 0.5000 mg | ORAL_TABLET | Freq: Two times a day (BID) | ORAL | Status: DC | PRN

## 2021-07-15 MED ORDER — METRONIDAZOLE 500 MG/100ML IV SOLN
500.0000 mg | Freq: Once | INTRAVENOUS | Status: AC
  Administered 2021-07-15: 500 mg via INTRAVENOUS
  Filled 2021-07-15: qty 100

## 2021-07-15 MED ORDER — IOHEXOL 350 MG/ML SOLN
75.0000 mL | Freq: Once | INTRAVENOUS | Status: AC | PRN
  Administered 2021-07-15: 75 mL via INTRAVENOUS

## 2021-07-15 MED ORDER — ACETAMINOPHEN 325 MG PO TABS
650.0000 mg | ORAL_TABLET | Freq: Four times a day (QID) | ORAL | Status: AC | PRN
  Administered 2021-07-16 – 2021-07-17 (×3): 650 mg via ORAL
  Filled 2021-07-15 (×3): qty 2

## 2021-07-15 MED ORDER — MELATONIN 5 MG PO TABS
2.5000 mg | ORAL_TABLET | Freq: Every day | ORAL | Status: DC
  Administered 2021-07-15 – 2021-07-19 (×5): 2.5 mg via ORAL
  Filled 2021-07-15 (×5): qty 1

## 2021-07-15 MED ORDER — NITROGLYCERIN 0.4 MG SL SUBL: 0.4000 mg | SUBLINGUAL_TABLET | SUBLINGUAL | Status: DC | PRN

## 2021-07-15 MED ORDER — MORPHINE SULFATE (PF) 2 MG/ML IV SOLN
1.0000 mg | INTRAVENOUS | Status: AC | PRN
  Administered 2021-07-16 – 2021-07-17 (×3): 1 mg via INTRAVENOUS
  Filled 2021-07-15 (×3): qty 1

## 2021-07-15 MED ORDER — VITAMIN B-12 1000 MCG PO TABS
1000.0000 ug | ORAL_TABLET | Freq: Every day | ORAL | Status: DC
  Administered 2021-07-16 – 2021-07-18 (×3): 1000 ug via ORAL
  Filled 2021-07-15 (×4): qty 1

## 2021-07-15 MED ORDER — SODIUM CHLORIDE 0.9 % IV SOLN
1.0000 g | Freq: Once | INTRAVENOUS | Status: AC
  Administered 2021-07-15: 1 g via INTRAVENOUS
  Filled 2021-07-15: qty 10

## 2021-07-15 MED ORDER — MONTELUKAST SODIUM 10 MG PO TABS
10.0000 mg | ORAL_TABLET | Freq: Every day | ORAL | Status: DC
  Administered 2021-07-15 – 2021-07-19 (×5): 10 mg via ORAL
  Filled 2021-07-15 (×5): qty 1

## 2021-07-15 MED ORDER — MOMETASONE FURO-FORMOTEROL FUM 200-5 MCG/ACT IN AERO
2.0000 | INHALATION_SPRAY | Freq: Two times a day (BID) | RESPIRATORY_TRACT | Status: DC
  Administered 2021-07-15 – 2021-07-20 (×10): 2 via RESPIRATORY_TRACT
  Filled 2021-07-15: qty 8.8

## 2021-07-15 MED ORDER — LACTATED RINGERS IV BOLUS
1000.0000 mL | Freq: Once | INTRAVENOUS | Status: AC
  Administered 2021-07-15: 1000 mL via INTRAVENOUS

## 2021-07-15 MED ORDER — ALBUTEROL SULFATE (2.5 MG/3ML) 0.083% IN NEBU: 2.5000 mg | INHALATION_SOLUTION | RESPIRATORY_TRACT | Status: DC | PRN

## 2021-07-15 MED ORDER — NICOTINE 14 MG/24HR TD PT24: 14.0000 mg | MEDICATED_PATCH | Freq: Every day | TRANSDERMAL | Status: DC | PRN

## 2021-07-15 MED ORDER — DULOXETINE HCL 30 MG PO CPEP
30.0000 mg | ORAL_CAPSULE | Freq: Two times a day (BID) | ORAL | Status: DC
  Administered 2021-07-15 – 2021-07-19 (×7): 30 mg via ORAL
  Filled 2021-07-15 (×9): qty 1

## 2021-07-15 MED ORDER — INSULIN ASPART 100 UNIT/ML IJ SOLN: 0.0000 [IU] | Freq: Every day | INTRAMUSCULAR | Status: DC

## 2021-07-15 MED ORDER — ALBUTEROL SULFATE HFA 108 (90 BASE) MCG/ACT IN AERS: 2.0000 | INHALATION_SPRAY | Freq: Four times a day (QID) | RESPIRATORY_TRACT | Status: DC | PRN

## 2021-07-15 MED ORDER — SODIUM CHLORIDE 0.9% IV SOLUTION: Freq: Once | INTRAVENOUS | Status: AC

## 2021-07-15 MED ORDER — ONDANSETRON HCL 4 MG PO TABS
4.0000 mg | ORAL_TABLET | Freq: Four times a day (QID) | ORAL | Status: DC | PRN
Start: 2021-07-15 — End: 2021-07-20

## 2021-07-15 MED ORDER — ACETAMINOPHEN 650 MG RE SUPP: 650.0000 mg | Freq: Four times a day (QID) | RECTAL | Status: AC | PRN

## 2021-07-15 MED ORDER — DAPAGLIFLOZIN PROPANEDIOL 5 MG PO TABS
5.0000 mg | ORAL_TABLET | Freq: Every day | ORAL | Status: DC
  Filled 2021-07-15: qty 1

## 2021-07-15 MED ORDER — MIDODRINE HCL 5 MG PO TABS
10.0000 mg | ORAL_TABLET | Freq: Once | ORAL | Status: AC
  Administered 2021-07-15: 10 mg via ORAL
  Filled 2021-07-15: qty 2

## 2021-07-15 MED ORDER — SODIUM CHLORIDE 0.9% IV SOLUTION
Freq: Once | INTRAVENOUS | Status: AC
  Filled 2021-07-15: qty 250

## 2021-07-15 NOTE — ED Triage Notes (Signed)
Pt arrives via EMS from [facility] for n/v for 3 days and is producing black emesis. Patient also having black stools. Hx: DM.  Previous vitals: 83/40, HR 80, SPO2 98% on R/A, pt is on blood thinners. BG 219, Temp 98.3, A&O x4. C/O generalized weakness and tenderness in left upper quadrant at 7/10. Has previous R leg amputation. PT A&O x4

## 2021-07-15 NOTE — Progress Notes (Addendum)
[facility] Progress Note  Discussed with nursing staff the patient blood pressure is slowly trending down  Vitals: Afebrile, heart rate of 93, respiration rate of 22, blood pressure 92/53, MAP 66 Physical exam: Generalized abdominal pain.  Negative for vomiting at this time. Mental status remains unchanged  Labs reviewed: Repeat CBC after blood transfusion showed WBC 14.3, hemoglobin 9.6, hematocrit 28.2, platelets 184  # Hypotension-responsive to fluids secondary to GI bleed and  - Repeat labs reviewed were reassuring - Sodium chloride 1 L bolus ordered - Midodrine 10 mg p.o. once - Repeat hemoglobin and hematocrit, discussed with RN staff - 1 unit of packed red blood cells ordered - No transfusion ordered at this time pending repeat hemoglobin hematocrit - Hemoglobin repeat was 8.2/24.6 , will page GI at [phone] [facility] cxr and abdominal x-ray  - Discussed with cross coverage provider and nursing staff - If patient becomes nauseous and or vomiting, Dr. Allen Norris recommends putting in an NG tube to assess for coffee-ground versus bright red blood - GI can be reached at [phone]  Dr. Tobie Poet  CRITICAL CARE Performed by: Briant Cedar Demosthenes Virnig  Total critical care time: 30 minutes  Critical care time was exclusive of separately billable procedures and treating other patients.  Critical care was necessary to treat or prevent imminent or life-threatening deterioration.  Critical care was time spent personally by me on the following activities: development of treatment plan with patient and/or surrogate as well as nursing, discussions with consultants, evaluation of patient's response to treatment, examination of patient, obtaining history from patient or surrogate, ordering and performing treatments and interventions, ordering and review of laboratory studies, ordering and review of radiographic studies, pulse oximetry and re-evaluation of patient's condition.

## 2021-07-15 NOTE — ED Notes (Signed)
MD at bedside  aware of persistent hypotension.

## 2021-07-15 NOTE — Progress Notes (Signed)
Called from ED about low blood pressure and abdominal pain. Spoke to Dr. Tobie Poet and we discussed getting imaging due to severe abdominal pain and placing an NG since there does not seem to be any vomiting of blood or passage of blood per rectum as he had before. Hb went up after the transfusion but has started to go down with this episode.  The patient will need to be stabilized prior to considering endoscopic intervention and general<B R>Surgery and/or vascular surgery involvement should be considered.

## 2021-07-15 NOTE — ED Notes (Signed)
Condom cath placed per pt request

## 2021-07-15 NOTE — ED Notes (Signed)
Blood products transfusion consent signed on paper at 10:56 am and witnessed by [facility].

## 2021-07-15 NOTE — Consult Note (Signed)
[name], MD [facility]  [address], [place], [place] [address] Phone: [phone] Fax : [phone]  Consultation  Referring Provider:     Dr. Starleen Blue Primary Care Physician:  Orvis Brill, [facility] Primary Gastroenterologist:  [facility]         Reason for Consultation:     GI bleeding  Date of Admission:  07/15/2021 Date of Consultation:  07/15/2021         HPI:   Shawn Jennings is a 75 y.o. male who reports that he was feeling well yesterday and had abdominal pain.  The patient reported that he vomited multiple times with only food but then after vomiting multiple times he started to have coffee ground emesis and black stools.  The patient states he had multiple episodes of black stools yesterday.  The patient now reports that his abdominal pain has resolved and he had only 1 doubt of rectal bleeding today. On admission the patient was hypotensive and tachycardic and was found to have a hemoglobin of 7.1.  8 months ago he had a hemoglobin of 11.7 but was consistently between 9.2 and 11.7 the year prior. The patient was hydrated and a blood transfusion was started.  He reports that he is feeling much better since being admitted and treated.  The patient denies taking any Advil E Motrin BC's  or Goody powders.  He is on a blood thinner and that has been stopped.  He has a history of Aortic stenosis and coronary artery disease.  He also has ischemic cardiomyopathy and A. Fib.  The patient had seen me in the past for an ERCP with a stent placement and then a repeat ERCP in March to remove the stent.  Past Medical History:  Diagnosis Date   Aortic stenosis    a. Pt unaware of history but CT imaging 01/2019 consistent w/ AVR; b. 01/2019 Echo: Mild to mod AS. Mean grad 23mmHg.   CAD (coronary artery disease)    a. 1994 s/p CABG ([place]); b. Reports multiple stress tests over the years w/o repeat cath; c. 01/2019 MV: large, sev, fixed inf and inflat defect extending to apex. No  ischemia. EF 37%-->Med rx given atypical Ss and pt wishes.   CKD (chronic kidney disease), stage III (HCC)    COPD (chronic obstructive pulmonary disease) (HCC)    Depression    Essential hypertension    GERD (gastroesophageal reflux disease)    Hyperlipidemia LDL goal <70    Ischemic cardiomyopathy    a. 01/2019 Echo: EF 40-45%, impaired relaxation. Mildly dil LA. Sev mitral annular Ca2+. Mild to mod AS.   PAD (peripheral artery disease) ([place])    a. 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   PAF (paroxysmal atrial fibrillation) (HCC)    a. CHA2DS2VASc = 4-->Xarelto 15mg  daily in setting of CKD.   Thyrotoxicosis    Tobacco abuse    Type II diabetes mellitus ([place])     Past Surgical History:  Procedure Laterality Date   CARDIAC CATHETERIZATION     CHOLECYSTECTOMY     CORONARY ARTERY BYPASS GRAFT     a. [place], [place]   ENDOSCOPIC RETROGRADE CHOLANGIOPANCREATOGRAPHY (ERCP) WITH PROPOFOL N/A 10/19/2020   Procedure: ENDOSCOPIC RETROGRADE CHOLANGIOPANCREATOGRAPHY (ERCP) WITH PROPOFOL;  Surgeon: [name], MD;  Location: [facility] ENDOSCOPY;  Service: Endoscopy;  Laterality: N/A;   ERCP N/A 12/29/2020   Procedure: ENDOSCOPIC RETROGRADE CHOLANGIOPANCREATOGRAPHY (ERCP);  Surgeon: [name], MD;  [facility];  Service: Endoscopy;  Laterality: N/A;  Left AKA     RIGHT/LEFT HEART CATH AND CORONARY ANGIOGRAPHY N/A 06/06/2020   Procedure: RIGHT/LEFT HEART CATH AND CORONARY ANGIOGRAPHY;  Surgeon: Wellington Hampshire, MD;  Location: [place] [facility];  Service: Cardiovascular;  Laterality: N/A;    Prior to Admission medications   Medication Sig Start Date End Date Taking? Authorizing Provider  acetaminophen (TYLENOL) 500 MG tablet Take 500 mg by mouth every 6 (six) hours as needed.    [provider]  albuterol (PROVENTIL) (2.5 MG/3ML) 0.083% nebulizer solution Take 2.5 mg by nebulization every 4 (four) hours as needed for wheezing or shortness of breath.    [provider]  albuterol  (VENTOLIN HFA) 108 (90 Base) MCG/ACT inhaler Inhale 2 puffs into the lungs every 6 (six) hours as needed for wheezing or shortness of breath.     [provider]  aspirin EC 81 MG tablet Take 81 mg by mouth daily.    [provider]  atorvastatin (LIPITOR) 80 MG tablet Take 80 mg by mouth at bedtime.    [provider]  carvedilol (COREG) 3.125 MG tablet Take 1 tablet (3.125 mg total) by mouth 2 (two) times daily. 10/25/20 12/24/20  Shelly Coss, MD  DULoxetine (CYMBALTA) 30 MG capsule Take 30 mg by mouth 2 (two) times daily.    [provider]  ferrous sulfate 325 (65 FE) MG tablet Take 325 mg by mouth daily.    [provider]  Fluticasone-Salmeterol (ADVAIR) 250-50 MCG/DOSE AEPB Inhale 1 puff into the lungs 2 (two) times daily.    [provider]  furosemide (LASIX) 40 MG tablet Take 1 tablet (40 mg total) by mouth daily. Take 1 tablet (80 mg total) once daily in the morning 10/25/20   Shelly Coss, MD  hydrOXYzine (ATARAX/VISTARIL) 25 MG tablet Take 25 mg by mouth every 6 (six) hours as needed (allergy symptoms).    [provider]  isosorbide mononitrate (IMDUR) 30 MG 24 hr tablet Take 1 tablet (30 mg total) by mouth daily. 03/11/20 05/27/29  Sharen Hones, MD  ketotifen (ZADITOR) 0.025 % ophthalmic solution Place 1 drop into both eyes 2 (two) times daily as needed.     [provider]  lactulose (CHRONULAC) 10 GM/15ML solution Take 30 g by mouth every 12 (twelve) hours as needed for mild constipation.    [provider]  lamoTRIgine (LAMICTAL) 25 MG tablet Take 25 mg by mouth 2 (two) times daily.    [provider]  lidocaine (LIDODERM) 5 % Cut 1 patch in half and apply to amputated stump once daily 12 hours on, 12 hours off    [provider]  liver oil-zinc oxide (DESITIN) 40 % ointment Apply 1 application topically at bedtime as needed for irritation. To buttocks    [provider]   loperamide (IMODIUM) 2 MG capsule Take by mouth. 03/31/20   [provider]  LORazepam (ATIVAN) 0.5 MG tablet Take 0.5 mg by mouth 2 (two) times daily as needed for anxiety.    [provider]  Melatonin 3 MG TABS Take 1 tablet by mouth at bedtime.    [provider]  metFORMIN (GLUCOPHAGE) 1000 MG tablet Take 1,000 mg by mouth daily with breakfast. 01/10/21   [provider]  montelukast (SINGULAIR) 10 MG tablet Take 10 mg by mouth at bedtime.    [provider]  nitroGLYCERIN (NITROSTAT) 0.4 MG SL tablet Place 0.4 mg under the tongue every 5 (five) minutes as needed for chest  pain.    [provider]  pantoprazole (PROTONIX) 40 MG tablet Take 40 mg by mouth daily.    [provider]  potassium chloride SA (KLOR-CON) 20 MEQ tablet Take 1 tablet (20 mEq total) by mouth daily. 10/25/20 01/23/21  Shelly Coss, MD  predniSONE (DELTASONE) 20 MG tablet 3 tablets daily x 4 days 11/08/20   Paulette Blanch, MD  Rivaroxaban (XARELTO) 15 MG TABS tablet Take 15 mg by mouth daily.    [provider]  sacubitril-valsartan (ENTRESTO) 24-26 MG Take 1 tablet by mouth 2 (two) times daily. 02/21/21   Loel Dubonnet, NP  senna (SENOKOT) 8.6 MG tablet Take 2 tablets by mouth at bedtime.    [provider]  sodium bicarbonate 650 MG tablet Take 1 tablet (650 mg total) by mouth 2 (two) times daily. 10/25/20   Shelly Coss, MD  tamsulosin (FLOMAX) 0.4 MG CAPS capsule Take 0.4 mg by mouth daily.    [provider]  traZODone (DESYREL) 50 MG tablet Take 25 mg by mouth at bedtime as needed for sleep.    [provider]  triamcinolone cream (KENALOG) 0.1 % Apply 1 application topically 2 (two) times daily.    [provider]    Family History  Problem Relation Age of Onset   Heart attack Mother        died @ 63.   Other Father        never knew his father.   Other Sister        overdose of sleeping pills.   Heart  disease Brother        died in his 87's   Other Sister        complication of abd surgery @ 13   CAD Sister      Social History   Tobacco Use   Smoking status: Every Day    Packs/day: 1.00    Years: 62.00    Pack years: 62.00    Types: Cigarettes   Smokeless tobacco: Never  Vaping Use   Vaping Use: Never used  Substance Use Topics   Alcohol use: Never   Drug use: Never    Allergies as of 07/15/2021   (No Known Allergies)    Review of Systems:    All systems reviewed and negative except where noted in HPI.   Physical Exam:  Vital signs in last 24 hours: Temp:  [97.7 F (36.5 C)-98.2 F (36.8 C)] 98.2 F (36.8 C) (09/24 1440) Pulse Rate:  [60-105] 73 (09/24 1400) Resp:  [14-21] 21 (09/24 1400) BP: (73-106)/(43-65) 106/58 (09/24 1400) SpO2:  [95 %-100 %] 100 % (09/24 1400) Weight:  [67 kg] 67 kg (09/24 1049)   General:   Pleasant, cooperative in NAD Head:  Normocephalic and atraumatic. Eyes:   No icterus.   Conjunctiva pink. PERRLA. Ears:  Normal auditory acuity. Neck:  Supple; no masses or thyroidomegaly Lungs: Respirations even and unlabored. Lungs clear to auscultation bilaterally.   No wheezes, crackles, or rhonchi.  Heart:  Regular rate and rhythm;  Without murmur, clicks, rubs or gallops Abdomen:  Soft, nondistended, nontender. Normal bowel sounds. No appreciable masses or hepatomegaly.  No rebound or guarding.  Rectal:  Not performed. Msk:  Symmetrical without gross deformities.   Extremities:  Left AKA Neurologic:  Alert and oriented x3;  grossly normal neurologically. Skin:  Intact without significant lesions or rashes. Cervical Nodes:  No significant cervical adenopathy. Psych:  Alert and cooperative. Normal affect.  LAB  RESULTS: Recent Labs    07/15/21 1046  WBC 15.1*  HGB 7.1*  HCT 21.6*  PLT 212   BMET Recent Labs    07/15/21 1046  NA 138  K 4.1  CL 107  CO2 22  GLUCOSE 170*  BUN 48*  CREATININE 1.65*  CALCIUM 8.7*    LFT Recent Labs    07/15/21 1046  PROT 5.9*  ALBUMIN 3.2*  AST 15  ALT 10  ALKPHOS 70  BILITOT 0.6   PT/INR Recent Labs    07/15/21 1046  LABPROT 24.1*  INR 2.2*    STUDIES: CT ABDOMEN PELVIS W CONTRAST  Result Date: 07/15/2021 CLINICAL DATA:  Abdominal pain, acute, nonlocalized, emesis EXAM: CT ABDOMEN AND PELVIS WITH CONTRAST TECHNIQUE: Multidetector CT imaging of the abdomen and pelvis was performed using the standard protocol following bolus administration of intravenous contrast. CONTRAST:  74mL OMNIPAQUE IOHEXOL 350 MG/ML SOLN COMPARISON:  10/17/2020 FINDINGS: Lower chest: Reticulonodular opacities within the dependent portion of the right lower lobe. Prosthetic aortic valve. Coronary artery calcification. Hepatobiliary: No focal liver abnormality is identified. Prior cholecystectomy. Resolved biliary dilatation. No residual stones are seen within the common bile duct. Pancreas: Unremarkable. No pancreatic ductal dilatation or surrounding inflammatory changes. Spleen: Normal in size without focal abnormality. Adrenals/Urinary Tract: Unremarkable adrenal glands. Calcifications within the bilateral renal hila are favored to represent vascular calcification. Subcentimeter low-density lesions within each kidney, likely cysts. No hydronephrosis. Bilateral ureters are unremarkable. Urinary bladder within normal limits. Stomach/Bowel: There is gastric wall thickening along the greater curvature. Small periampullary duodenal diverticulum is again noted. Anastomotic sutures in the left upper quadrant. No dilated loops of bowel to suggest obstruction. Normal appendix in the right lower quadrant (series 2, image 59). Mild long segment thickening of the rectosigmoid colon. No focally thickened segment. No pericolonic inflammatory changes. Vascular/Lymphatic: Advanced atherosclerotic calcifications throughout the aortoiliac axis without aneurysm. Chronically occluded left SFA stent. No  abdominopelvic lymphadenopathy. Reproductive: Prostate is unremarkable. Other: No free fluid. No abdominopelvic fluid collection. No pneumoperitoneum. No abdominal wall hernia. Musculoskeletal: No acute or significant osseous findings. IMPRESSION: 1. Gastric wall thickening along the greater curvature, which may represent gastritis. 2. Mild long segment thickening of the rectosigmoid colon may represent a nonspecific colitis. 3. Reticulonodular opacities within the dependent portion of the right lower lobe may represent an infectious or inflammatory process versus aspiration. 4. Advanced aortoiliac atherosclerosis (ICD10-I70.0). Electronically Signed   By: Davina Poke D.O.   On: 07/15/2021 12:37      Impression / Plan:   Assessment: Principal Problem:   GI bleed Active Problems:   Chronic kidney disease, stage 3a (HCC)   Essential hypertension   Coronary artery disease of native artery of native heart with stable angina pectoris (HCC)   Protein-calorie malnutrition, severe   AF (paroxysmal atrial fibrillation) (HCC)   On anticoagulant therapy   Depression   GERD (gastroesophageal reflux disease)   Tobacco abuse   Hyperlipidemia   Shawn Jennings is a 75 y.o. y/o male with a history of vomiting multiple times with food and then it started to turn into coffee grounds with subsequent black stools.  The patient doesn't take any NSAIDs and likely had a Mallory-Weiss tear.  He is not having any abdominal pain any longer.  There is no report of any vomiting of bright red blood.  He also states that his frequent black stools have decreased to only 1 stool today.  Plan:  This patient likely had a Mallory-Weiss tear from his vomiting.  The patient has stabilized and is not having any further signs of active GI bleeding.  The patient is having his Anti-coagulation held and he is being transfused blood. He has also been given reversal agents for his blood thinner. The patient will be monitored for  any further signs of GI bleeding.  If there is any urgent need for endoscopic intervention due to recurrent bleeding then we will proceed with that if the patient remains stable then it may be deferred until Monday morning.  PPI IV twice daily  Continue serial CBCs and transfuse PRN Avoid NSAIDs Maintain 2 large-bore IV lines Please page GI with any acute hemodynamic changes, or signs of active GI bleeding   Thank you for involving me in the care of this patient.      LOS: 0 days   [name], MD, [facility] 07/15/2021, 2:45 PM,  Pager [phone] 7am-5pm  Check AMION for 5pm -7am coverage and on weekends   Note: This dictation was prepared with Dragon dictation along with smaller phrase technology. Any transcriptional errors that result from this process are unintentional.

## 2021-07-15 NOTE — H&P (Addendum)
History and Physical   Shawn Jennings OXB:[id number] DOB: 1946-08-15 DOA: 07/15/2021  PCP: Shawn Jennings, [facility]  Outpatient Specialists: Dr. Allen Jennings Patient coming from: [facility]  I have personally briefly reviewed patient's old medical records in [place].  Chief Concern: N/V/black emesis for 3 days  HPI: Shawn Jennings is a 75 y.o. male with medical history significant for cad, history of cabg in 68 in [place], [place], gerd, depression, anxiety, ischemic cardiomyopathy, afib (paroxysmal), ckd3a/b, hld, tobacco abuse, debility, aortic stenosis s/p avr, copd, htn, pad s/p left aka in 2016,   He endorses Two days of vomiting blood. He reports this is the first time. He stated that he didn't come in earlier "it's because of stupidity, I was hoping it would go away". He endorses black tarry diarrhea for two days, too many to count. He reports he has never had that before. He reports not being on iron tablets.  He denies NSAIDs, BC powder, Goody powder use.  He endorses generalized weakness and fatigue in the last week.  He states that in the emergency department after blood transfusion and fluids, he feels better.  He denies LOC or syncope. He denies chest pain, shortness of breath, swelling in his lower extremity. He denies fever, chills, dysuria, hematuria, cough.  He endorses loss of appetite in the last 2 days.  Social history: He lives in a group home, assisted living. He smokes 6 cigarettes per day, at his peak, 1 ppd. He denies etoh and recreational drug use.   Vaccination history: He is vaccinated for covid 19.  ROS: Constitutional: no weight change, no fever ENT/Mouth: no sore throat, no rhinorrhea Eyes: no eye pain, no vision changes Cardiovascular: no chest pain, no dyspnea,  no edema, no palpitations Respiratory: no cough, no sputum, no wheezing Gastrointestinal: + nausea, + vomiting, + diarrhea, no constipation Genitourinary: no urinary incontinence, no dysuria,  no hematuria Musculoskeletal: no arthralgias, no myalgias Skin: no skin lesions, no pruritus, Neuro: + weakness, no loss of consciousness, no syncope Psych: no anxiety, no depression, + decrease appetite, denies SI and HI Heme/Lymph: no bruising, + bleeding  ED Course: Discussed with EDP, patient requiring hospitalization for chief concern of GI bleed.   Vitals in the ED showed temperature of 97.7, RR 18, HR 87, initial blood pressure 73/56 and improved to 102/65, spO2 is 97% on room air.   Labs in the ED showed serum sodium 138, K 4.1, Cl 107, bicarb 22, bun 48, serum Cr 1.65, nonfasting blood glucose 170, wbc 15.1, hemoglobin 7.1, platelets 212. Lipase 119, HS trop 16.   In the emergency department patient was started on Protonix bolus and GTT, ceftriaxone 1 g, lactated ringer 1 L bolus, sodium chloride 500 mL bolus.  2 units of packed red blood was ordered and transfused.  Patient was initially hypotensive and responded appropriately.  ED provider consulted gastroenterology, Dr. Allen Jennings.  ED provider consulted ICU, who recommended 1 L lactated ringer bolus.  Assessment/Plan  Principal Problem:   GI bleed Active Problems:   Chronic kidney disease, stage 3a (HCC)   Essential hypertension   Coronary artery disease of native artery of native heart with stable angina pectoris (HCC)   Protein-calorie malnutrition, severe   AF (paroxysmal atrial fibrillation) (HCC)   On anticoagulant therapy   Depression   GERD (gastroesophageal reflux disease)   Tobacco abuse   Hyperlipidemia   # GI bleed -differentials include gastric ulcer versus Mallory-Weiss tear, Deulafoy's lesion, H.pylori gastric ulcer # Hypotension - responsive  to IV fluids - Continue protonix gtt. - Gastroenterology specialist, Dr. Allen Jennings has been consulted - Patient has been typed and screened, status post 2 units of packed red blood - Timed recheck of hemoglobin hematocrit at 1500 ordered - Clear liquids diet now, n.p.o.  after midnight except for sips with meds and ice chips - Ordered sodium chloride 150 mL/h, for 1 day; goal MAP of greater than 60 - Maintain 2 large-bore IVs  # Acute on chronic anemia-presumed secondary to GI bleed - Recommend outpatient provider, anemia panel in 2 to 3 months once patient has been stabilized  # Leukocytosis-I suspect this is reactive in setting of acute GI bleed resulting in acute anemia - I will check procalcitonin, UA.  I have low clinical suspicion for infectious etiology at this time, we will continue to monitor the labs ordered, and antibiotics will be initiated as appropriate - CBC in the a.m.  # Depression/anxiety-resumed Cymbalta 30 mg p.o. twice daily, lamotrigine 25 mg p.o. twice daily - Home lorazepam 0.5 mg p.o. twice daily as needed for anxiety resumed  # Non-insulin-dependent diabetes mellitus-resumed Farxiga 5 mg daily - Insulin SSI with at bedtime coverage ordered  # CAD-atorvastatin, holding home aspirin in setting of GI bleed - Patient is on carvedilol 3.125 mg p.o. twice daily, isosorbide mononitrate 30 mg daily, Entresto 24-26 mg twice daily, these have been held due to initial presentation of hypotension - Resumed nitroglycerin sublingual 0.4 mg as needed for chest pain - A.m. team to resume as appropriate  # Hyperlipidemia-atorvastatin 80 mg nightly  # Paroxysmal atrial fibrillation-holding Xarelto 50 mg daily  # Insomnia-melatonin 3 mg p.o. nightly  # Tobacco abuse and dependence-nicotine patch as needed daily for nicotine craving ordered  # PAD s/p AKA  # DVT prophylaxis-pharmacologic DVT prophylaxis has not been initiated by myself due to patient presenting with upper GI bleed - A.m. team to initiate pharmacologic DVT prophylaxis when appropriate  Chart reviewed.   Echocardiogram on 10/19/2020: Estimated ejection fraction read as 50 to 55%, left ventricular diastolic parameters were normal.  Left atrial size was mildly dilated.  Right  atrial size was mildly dilated.  DVT prophylaxis: TED hose Code Status: Limited.  In the event that his heart stops beating, patient would like to be let go.  Patient is okay with intubation should he not be able to breathe and/or have respiratory distress Diet: Clear liquids, n.p.o. after midnight Family Communication: No Disposition Plan: Pending clinical course Consults called: Gastroenterology Admission status: Stepdown, inpatient, telemetry ordered  Past Medical History:  Diagnosis Date   Aortic stenosis    a. Pt unaware of history but CT imaging 01/2019 consistent w/ AVR; b. 01/2019 Echo: Mild to mod AS. Mean grad 68mmHg.   CAD (coronary artery disease)    a. 1994 s/p CABG ([place]); b. Reports multiple stress tests over the years w/o repeat cath; c. 01/2019 MV: large, sev, fixed inf and inflat defect extending to apex. No ischemia. EF 37%-->Med rx given atypical Ss and pt wishes.   CKD (chronic kidney disease), stage III (HCC)    COPD (chronic obstructive pulmonary disease) (HCC)    Depression    Essential hypertension    GERD (gastroesophageal reflux disease)    Hyperlipidemia LDL goal <70    Ischemic cardiomyopathy    a. 01/2019 Echo: EF 40-45%, impaired relaxation. Mildly dil LA. Sev mitral annular Ca2+. Mild to mod AS.   PAD (peripheral artery disease) ([place])    a. 2016 s/p L  AKA.   PAF (paroxysmal atrial fibrillation) (HCC)    a. CHA2DS2VASc = 4-->Xarelto 15mg  daily in setting of CKD.   Thyrotoxicosis    Tobacco abuse    Type II diabetes mellitus ([place])    Past Surgical History:  Procedure Laterality Date   CARDIAC CATHETERIZATION     CHOLECYSTECTOMY     CORONARY ARTERY BYPASS GRAFT     a. [place], [place]   ENDOSCOPIC RETROGRADE CHOLANGIOPANCREATOGRAPHY (ERCP) WITH PROPOFOL N/A 10/19/2020   Procedure: ENDOSCOPIC RETROGRADE CHOLANGIOPANCREATOGRAPHY (ERCP) WITH PROPOFOL;  Surgeon: [name], MD;  Location: [facility] ENDOSCOPY;  Service: Endoscopy;  Laterality: N/A;    ERCP N/A 12/29/2020   Procedure: ENDOSCOPIC RETROGRADE CHOLANGIOPANCREATOGRAPHY (ERCP);  Surgeon: [name], MD;  Location: [facility] ENDOSCOPY;  Service: Endoscopy;  Laterality: N/A;   Left AKA     RIGHT/LEFT HEART CATH AND CORONARY ANGIOGRAPHY N/A 06/06/2020   Procedure: RIGHT/LEFT HEART CATH AND CORONARY ANGIOGRAPHY;  Surgeon: Wellington Hampshire, MD;  Location: [facility];  Service: Cardiovascular;  Laterality: N/A;   Social History:  reports that he has been smoking. He has a 62.00 pack-year smoking history. He has never used smokeless tobacco. He reports that he does not drink alcohol and does not use drugs.  No Known Allergies Family History  Problem Relation Age of Onset   Heart attack Mother        died @ 49.   Other Father        never knew his father.   Other Sister        overdose of sleeping pills.   Heart disease Brother        died in his 87's   Other Sister        complication of abd surgery @ 50   CAD Sister    Family history: Family history reviewed and not pertinent  Prior to Admission medications   Medication Sig Start Date End Date Taking? Authorizing Provider  acetaminophen (TYLENOL) 500 MG tablet Take 500 mg by mouth every 6 (six) hours as needed.    [provider]  albuterol (PROVENTIL) (2.5 MG/3ML) 0.083% nebulizer solution Take 2.5 mg by nebulization every 4 (four) hours as needed for wheezing or shortness of breath.    [provider]  albuterol (VENTOLIN HFA) 108 (90 Base) MCG/ACT inhaler Inhale 2 puffs into the lungs every 6 (six) hours as needed for wheezing or shortness of breath.     [provider]  aspirin EC 81 MG tablet Take 81 mg by mouth daily.    [provider]  atorvastatin (LIPITOR) 80 MG tablet Take 80 mg by mouth at bedtime.    [provider]  carvedilol (COREG) 3.125 MG tablet Take 1 tablet (3.125 mg total) by mouth 2 (two) times daily. 10/25/20 12/24/20  Shelly Coss, MD  DULoxetine  (CYMBALTA) 30 MG capsule Take 30 mg by mouth 2 (two) times daily.    [provider]  ferrous sulfate 325 (65 FE) MG tablet Take 325 mg by mouth daily.    [provider]  Fluticasone-Salmeterol (ADVAIR) 250-50 MCG/DOSE AEPB Inhale 1 puff into the lungs 2 (two) times daily.    [provider]  furosemide (LASIX) 40 MG tablet Take 1 tablet (40 mg total) by mouth daily. Take 1 tablet (80 mg total) once daily in the morning 10/25/20   Shelly Coss, MD  hydrOXYzine (ATARAX/VISTARIL) 25 MG tablet Take 25 mg by mouth every 6 (six) hours as needed (allergy symptoms).  [provider]  isosorbide mononitrate (IMDUR) 30 MG 24 hr tablet Take 1 tablet (30 mg total) by mouth daily. 03/11/20 05/27/29  Sharen Hones, MD  ketotifen (ZADITOR) 0.025 % ophthalmic solution Place 1 drop into both eyes 2 (two) times daily as needed.     [provider]  lactulose (CHRONULAC) 10 GM/15ML solution Take 30 g by mouth every 12 (twelve) hours as needed for mild constipation.    [provider]  lamoTRIgine (LAMICTAL) 25 MG tablet Take 25 mg by mouth 2 (two) times daily.    [provider]  lidocaine (LIDODERM) 5 % Cut 1 patch in half and apply to amputated stump once daily 12 hours on, 12 hours off    [provider]  liver oil-zinc oxide (DESITIN) 40 % ointment Apply 1 application topically at bedtime as needed for irritation. To buttocks    [provider]  loperamide (IMODIUM) 2 MG capsule Take by mouth. 03/31/20   [provider]  LORazepam (ATIVAN) 0.5 MG tablet Take 0.5 mg by mouth 2 (two) times daily as needed for anxiety.    [provider]  Melatonin 3 MG TABS Take 1 tablet by mouth at bedtime.    [provider]  metFORMIN (GLUCOPHAGE) 1000 MG tablet Take 1,000 mg by mouth daily with breakfast. 01/10/21   [provider]  montelukast (SINGULAIR) 10 MG tablet Take 10 mg by mouth at bedtime.    [provider]  nitroGLYCERIN (NITROSTAT) 0.4 MG SL tablet Place 0.4 mg under the tongue every 5 (five) minutes as needed for chest pain.    [provider]  pantoprazole (PROTONIX) 40 MG tablet Take 40 mg by mouth daily.    [provider]  potassium chloride SA (KLOR-CON) 20 MEQ tablet Take 1 tablet (20 mEq total) by mouth daily. 10/25/20 01/23/21  Shelly Coss, MD  predniSONE (DELTASONE) 20 MG tablet 3 tablets daily x 4 days 11/08/20   Paulette Blanch, MD  Rivaroxaban (XARELTO) 15 MG TABS tablet Take 15 mg by mouth daily.    [provider]  sacubitril-valsartan (ENTRESTO) 24-26 MG Take 1 tablet by mouth 2 (two) times daily. 02/21/21   Loel Dubonnet, NP  senna (SENOKOT) 8.6 MG tablet Take 2 tablets by mouth at bedtime.    [provider]  sodium bicarbonate 650 MG tablet Take 1 tablet (650 mg total) by mouth 2 (two) times daily. 10/25/20   Shelly Coss, MD  tamsulosin (FLOMAX) 0.4 MG CAPS capsule Take 0.4 mg by mouth daily.    [provider]  traZODone (DESYREL) 50 MG tablet Take 25 mg by mouth at bedtime as needed for sleep.    [provider]  triamcinolone cream (KENALOG) 0.1 % Apply 1 application topically 2 (two) times daily.    [provider]   Physical Exam: Vitals:   07/15/21 1330 07/15/21 1345 07/15/21 1354 07/15/21 1400  BP: (!) 102/55 102/65  (!) 106/58  Pulse: 76 71  73  Resp: 19 18  (!) 21  Temp:   98.1 F (36.7 C)   TempSrc:   Oral   SpO2: 99% 95%  100%  Weight:       Constitutional: appears age-appropriate, NAD, calm, comfortable Eyes: PERRL, lids and conjunctivae normal ENMT: Mucous membranes are moist. Posterior pharynx clear of any exudate or lesions. Age-appropriate dentition. Hearing appropriate Neck: normal, supple, no masses, no thyromegaly Respiratory: clear to auscultation bilaterally, no wheezing, no crackles. Normal respiratory effort. No  accessory muscle use.  Cardiovascular: Regular rate and  rhythm, no murmurs / rubs / gallops. No extremity edema. 2+ pedal pulses. No carotid bruits.  Abdomen: no tenderness, no masses palpated, no hepatosplenomegaly. Bowel sounds positive.  Musculoskeletal: no clubbing / cyanosis. Left AKA. Good ROM, no contractures, no atrophy. Normal muscle tone.  Skin: no rashes, lesions, ulcers. No induration.  Patient is pale. Neurologic: Sensation intact. Strength 5/5 in all 4.  Psychiatric: Normal judgment and insight. Alert and oriented x 3. Normal mood.   EKG: Ordered  Chest x-ray on Admission: I personally reviewed and I agree with radiologist reading as below.  CT ABDOMEN PELVIS W CONTRAST  Result Date: 07/15/2021 CLINICAL DATA:  Abdominal pain, acute, nonlocalized, emesis EXAM: CT ABDOMEN AND PELVIS WITH CONTRAST TECHNIQUE: Multidetector CT imaging of the abdomen and pelvis was performed using the standard protocol following bolus administration of intravenous contrast. CONTRAST:  39mL OMNIPAQUE IOHEXOL 350 MG/ML SOLN COMPARISON:  10/17/2020 FINDINGS: Lower chest: Reticulonodular opacities within the dependent portion of the right lower lobe. Prosthetic aortic valve. Coronary artery calcification. Hepatobiliary: No focal liver abnormality is identified. Prior cholecystectomy. Resolved biliary dilatation. No residual stones are seen within the common bile duct. Pancreas: Unremarkable. No pancreatic ductal dilatation or surrounding inflammatory changes. Spleen: Normal in size without focal abnormality. Adrenals/Urinary Tract: Unremarkable adrenal glands. Calcifications within the bilateral renal hila are favored to represent vascular calcification. Subcentimeter low-density lesions within each kidney, likely cysts. No hydronephrosis. Bilateral ureters are unremarkable. Urinary bladder within normal limits. Stomach/Bowel: There is gastric wall thickening along the greater curvature. Small periampullary duodenal diverticulum is again noted. Anastomotic sutures in the  left upper quadrant. No dilated loops of bowel to suggest obstruction. Normal appendix in the right lower quadrant (series 2, image 59). Mild long segment thickening of the rectosigmoid colon. No focally thickened segment. No pericolonic inflammatory changes. Vascular/Lymphatic: Advanced atherosclerotic calcifications throughout the aortoiliac axis without aneurysm. Chronically occluded left SFA stent. No abdominopelvic lymphadenopathy. Reproductive: Prostate is unremarkable. Other: No free fluid. No abdominopelvic fluid collection. No pneumoperitoneum. No abdominal wall hernia. Musculoskeletal: No acute or significant osseous findings. IMPRESSION: 1. Gastric wall thickening along the greater curvature, which may represent gastritis. 2. Mild long segment thickening of the rectosigmoid colon may represent a nonspecific colitis. 3. Reticulonodular opacities within the dependent portion of the right lower lobe may represent an infectious or inflammatory process versus aspiration. 4. Advanced aortoiliac atherosclerosis (ICD10-I70.0). Electronically Signed   By: Davina Poke D.O.   On: 07/15/2021 12:37    Labs on Admission: I have personally reviewed following labs  CBC: Recent Labs  Lab 07/15/21 1046  WBC 15.1*  NEUTROABS 11.9*  HGB 7.1*  HCT 21.6*  MCV 90.4  PLT 937   Basic Metabolic Panel: Recent Labs  Lab 07/15/21 1046  NA 138  K 4.1  CL 107  CO2 22  GLUCOSE 170*  BUN 48*  CREATININE 1.65*  CALCIUM 8.7*   GFR: Estimated Creatinine Clearance: 35.5 mL/min (A) (by C-G formula based on SCr of 1.65 mg/dL (H)).  Liver Function Tests: Recent Labs  Lab 07/15/21 1046  AST 15  ALT 10  ALKPHOS 70  BILITOT 0.6  PROT 5.9*  ALBUMIN 3.2*   Recent Labs  Lab 07/15/21 1048  LIPASE 119*   Coagulation Profile: Recent Labs  Lab 07/15/21 1046  INR 2.2*   Urine analysis:    Component Value Date/Time   COLORURINE AMBER (A) 10/18/2020 0902   APPEARANCEUR CLEAR (A) 10/18/2020 3428  LABSPEC 1.026 10/18/2020 0902   PHURINE 5.0 10/18/2020 0902   GLUCOSEU NEGATIVE 10/18/2020 0902   HGBUR NEGATIVE 10/18/2020 0902   BILIRUBINUR NEGATIVE 10/18/2020 0902   KETONESUR NEGATIVE 10/18/2020 0902   PROTEINUR NEGATIVE 10/18/2020 0902   NITRITE NEGATIVE 10/18/2020 0902   LEUKOCYTESUR NEGATIVE 10/18/2020 0902   Dr. Tobie Poet [facility]  If 7PM-7AM, please contact overnight-coverage provider If 7AM-7PM, please contact day coverage provider [email or web address]  07/15/2021, 2:27 PM

## 2021-07-15 NOTE — ED Notes (Signed)
This RN rounding on patient. Full bed change performed, appropriate pericare administered. Pt placed in position of comfort, warm blankets given. Fluids replaced.    Requesting medications early to go to sleep now.

## 2021-07-15 NOTE — ED Provider Notes (Signed)
[facility]  ____________________________________________   Event Date/Time   First MD Initiated Contact with Patient 07/15/21 1042     (approximate)  I have reviewed the triage vital signs and the nursing notes.   HISTORY  Chief Complaint Emesis    HPI Shawn Jennings is a 75 y.o. male past medical history of aortic stenosis, CAD status post CABG, paroxysmal A. fib on Eliquis who presents with GI bleeding.  Patient resides at [facility] home.  Notes he has had diarrhea for several days and then yesterday diarrhea turned dark black.  He is also had several episodes of emesis that are dark.  He denies prior history of GI bleeding.  He does have some associated epigastric discomfort and left upper quadrant pain.  Denies chest pain.  Feels diffusely weak and lightheaded.  Denies dyspnea.         Past Medical History:  Diagnosis Date   Aortic stenosis    a. Pt unaware of history but CT imaging 01/2019 consistent w/ AVR; b. 01/2019 Echo: Mild to mod AS. Mean grad 27mmHg.   CAD (coronary artery disease)    a. 1994 s/p CABG ([place]); b. Reports multiple stress tests over the years w/o repeat cath; c. 01/2019 MV: large, sev, fixed inf and inflat defect extending to apex. No ischemia. EF 37%-->Med rx given atypical Ss and pt wishes.   CKD (chronic kidney disease), stage III (HCC)    COPD (chronic obstructive pulmonary disease) (HCC)    Depression    Essential hypertension    GERD (gastroesophageal reflux disease)    Hyperlipidemia LDL goal <70    Ischemic cardiomyopathy    a. 01/2019 Echo: EF 40-45%, impaired relaxation. Mildly dil LA. Sev mitral annular Ca2+. Mild to mod AS.   PAD (peripheral artery disease) ([place])    a. 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   PAF (paroxysmal atrial fibrillation) (HCC)    a. CHA2DS2VASc = 4-->Xarelto 15mg  daily in setting of CKD.   Thyrotoxicosis    Tobacco abuse    Type II diabetes mellitus [facility])     Patient Active Problem List   Diagnosis  Date Noted   Encounter for fitting and adjustment of other gastrointestinal appliance and device    Cholangitis    Protein-calorie malnutrition, severe 10/18/2020   Choledocholithiasis 10/17/2020   Coronary artery disease of native artery of native heart with stable angina pectoris ([place])    Ischemic cardiomyopathy    Other chest pain 03/10/2020   Chronic kidney disease, stage 3a ([place]) 03/09/2020   Essential hypertension 03/09/2020   Chest pain 01/27/2019   Renal insufficiency    Nonspecific chest pain 01/26/2019    Past Surgical History:  Procedure Laterality Date   CARDIAC CATHETERIZATION     CHOLECYSTECTOMY     CORONARY ARTERY BYPASS GRAFT     a. Point CHOLANGIOPANCREATOGRAPHY (ERCP) WITH PROPOFOL N/A 10/19/2020   Procedure: ENDOSCOPIC RETROGRADE CHOLANGIOPANCREATOGRAPHY (ERCP) WITH PROPOFOL;  Surgeon: [name], MD;  Location: [facility] ENDOSCOPY;  Service: Endoscopy;  Laterality: N/A;   ERCP N/A 12/29/2020   Procedure: ENDOSCOPIC RETROGRADE CHOLANGIOPANCREATOGRAPHY (ERCP);  Surgeon: [name], MD;  Location: [facility] ENDOSCOPY;  Service: Endoscopy;  Laterality: N/A;   Left AKA     RIGHT/LEFT HEART CATH AND CORONARY ANGIOGRAPHY N/A 06/06/2020   Procedure: RIGHT/LEFT HEART CATH AND CORONARY ANGIOGRAPHY;  Surgeon: Wellington Hampshire, MD;  Location: [place] CV LAB;  Service: Cardiovascular;  Laterality: N/A;    Prior to  Admission medications   Medication Sig Start Date End Date Taking? Authorizing Provider  acetaminophen (TYLENOL) 500 MG tablet Take 500 mg by mouth every 6 (six) hours as needed.    [provider]  albuterol (PROVENTIL) (2.5 MG/3ML) 0.083% nebulizer solution Take 2.5 mg by nebulization every 4 (four) hours as needed for wheezing or shortness of breath.    [provider]  albuterol (VENTOLIN HFA) 108 (90 Base) MCG/ACT inhaler Inhale 2 puffs into the lungs every 6 (six) hours as needed for wheezing or shortness of  breath.     [provider]  aspirin EC 81 MG tablet Take 81 mg by mouth daily.    [provider]  atorvastatin (LIPITOR) 80 MG tablet Take 80 mg by mouth at bedtime.    [provider]  carvedilol (COREG) 3.125 MG tablet Take 1 tablet (3.125 mg total) by mouth 2 (two) times daily. 10/25/20 12/24/20  Shelly Coss, MD  DULoxetine (CYMBALTA) 30 MG capsule Take 30 mg by mouth 2 (two) times daily.    [provider]  ferrous sulfate 325 (65 FE) MG tablet Take 325 mg by mouth daily.    [provider]  Fluticasone-Salmeterol (ADVAIR) 250-50 MCG/DOSE AEPB Inhale 1 puff into the lungs 2 (two) times daily.    [provider]  furosemide (LASIX) 40 MG tablet Take 1 tablet (40 mg total) by mouth daily. Take 1 tablet (80 mg total) once daily in the morning 10/25/20   Shelly Coss, MD  hydrOXYzine (ATARAX/VISTARIL) 25 MG tablet Take 25 mg by mouth every 6 (six) hours as needed (allergy symptoms).    [provider]  isosorbide mononitrate (IMDUR) 30 MG 24 hr tablet Take 1 tablet (30 mg total) by mouth daily. 03/11/20 05/27/29  Sharen Hones, MD  ketotifen (ZADITOR) 0.025 % ophthalmic solution Place 1 drop into both eyes 2 (two) times daily as needed.     [provider]  lactulose (CHRONULAC) 10 GM/15ML solution Take 30 g by mouth every 12 (twelve) hours as needed for mild constipation.    [provider]  lamoTRIgine (LAMICTAL) 25 MG tablet Take 25 mg by mouth 2 (two) times daily.    [provider]  lidocaine (LIDODERM) 5 % Cut 1 patch in half and apply to amputated stump once daily 12 hours on, 12 hours off    [provider]  liver oil-zinc oxide (DESITIN) 40 % ointment Apply 1 application topically at bedtime as needed for irritation. To buttocks    [provider]  loperamide (IMODIUM) 2 MG capsule Take by mouth. 03/31/20   [provider]  LORazepam (ATIVAN) 0.5 MG tablet Take 0.5 mg by mouth 2  (two) times daily as needed for anxiety.    [provider]  Melatonin 3 MG TABS Take 1 tablet by mouth at bedtime.    [provider]  metFORMIN (GLUCOPHAGE) 1000 MG tablet Take 1,000 mg by mouth daily with breakfast. 01/10/21   [provider]  montelukast (SINGULAIR) 10 MG tablet Take 10 mg by mouth at bedtime.    [provider]  nitroGLYCERIN (NITROSTAT) 0.4 MG SL tablet Place 0.4 mg under the tongue every 5 (five) minutes as needed for chest pain.    [provider]  pantoprazole (PROTONIX) 40 MG tablet Take 40 mg by mouth daily.    [provider]  potassium chloride SA (KLOR-CON) 20 MEQ tablet Take 1 tablet (20 mEq total) by mouth daily. 10/25/20 01/23/21  Adhikari,  Amrit, MD  predniSONE (DELTASONE) 20 MG tablet 3 tablets daily x 4 days 11/08/20   Paulette Blanch, MD  Rivaroxaban (XARELTO) 15 MG TABS tablet Take 15 mg by mouth daily.    [provider]  sacubitril-valsartan (ENTRESTO) 24-26 MG Take 1 tablet by mouth 2 (two) times daily. 02/21/21   Loel Dubonnet, NP  senna (SENOKOT) 8.6 MG tablet Take 2 tablets by mouth at bedtime.    [provider]  sodium bicarbonate 650 MG tablet Take 1 tablet (650 mg total) by mouth 2 (two) times daily. 10/25/20   Shelly Coss, MD  tamsulosin (FLOMAX) 0.4 MG CAPS capsule Take 0.4 mg by mouth daily.    [provider]  traZODone (DESYREL) 50 MG tablet Take 25 mg by mouth at bedtime as needed for sleep.    [provider]  triamcinolone cream (KENALOG) 0.1 % Apply 1 application topically 2 (two) times daily.    [provider]    Allergies Patient has no known allergies.  Family History  Problem Relation Age of Onset   Heart attack Mother        died @ 47.   Other Father        never knew his father.   Other Sister        overdose of sleeping pills.   Heart disease Brother        died in his 74's   Other Sister        complication of abd surgery @ 42    CAD Sister     Social History Social History   Tobacco Use   Smoking status: Every Day    Packs/day: 1.00    Years: 62.00    Pack years: 62.00    Types: Cigarettes   Smokeless tobacco: Never  Vaping Use   Vaping Use: Never used  Substance Use Topics   Alcohol use: Never   Drug use: Never    Review of Systems   Review of Systems  Constitutional:  Positive for fatigue. Negative for activity change and appetite change.  Respiratory:  Negative for shortness of breath.   Cardiovascular:  Negative for chest pain.  Gastrointestinal:  Positive for abdominal pain, blood in stool, nausea and vomiting.  Neurological:  Positive for light-headedness.  All other systems reviewed and are negative.  Physical Exam Updated Vital Signs BP (!) 93/52   Pulse 60   Temp 98.2 F (36.8 C) (Oral)   Resp 17   Wt 67 kg   SpO2 98%   BMI 20.03 kg/m   Physical Exam Vitals and nursing note reviewed.  Constitutional:      General: He is not in acute distress.    Appearance: Normal appearance. He is ill-appearing.     Comments: Patient is pale appearing  HENT:     Head: Normocephalic and atraumatic.     Nose: Nose normal.  Eyes:     General: No scleral icterus.    Conjunctiva/sclera: Conjunctivae normal.  Pulmonary:     Effort: Pulmonary effort is normal. No respiratory distress.     Breath sounds: Normal breath sounds. No wheezing.  Abdominal:     Palpations: Abdomen is soft.     Tenderness: There is abdominal tenderness.     Comments: Tenderness to palpation in the epigastric region  Musculoskeletal:        General: No deformity or signs of injury.     Cervical back: Normal range of motion.  Skin:  Coloration: Skin is pale. Skin is not jaundiced.  Neurological:     General: No focal deficit present.     Mental Status: He is alert and oriented to person, place, and time. Mental status is at baseline.  Psychiatric:        Mood and Affect: Mood normal.        Behavior:  Behavior normal.     LABS (all labs ordered are listed, but only abnormal results are displayed)  Labs Reviewed  COMPREHENSIVE METABOLIC PANEL - Abnormal; Notable for the following components:      Result Value   Glucose, Bld 170 (*)    BUN 48 (*)    Creatinine, Ser 1.65 (*)    Calcium 8.7 (*)    Total Protein 5.9 (*)    Albumin 3.2 (*)    GFR, Estimated 43 (*)    All other components within normal limits  CBC WITH DIFFERENTIAL/PLATELET - Abnormal; Notable for the following components:   WBC 15.1 (*)    RBC 2.39 (*)    Hemoglobin 7.1 (*)    HCT 21.6 (*)    Neutro Abs 11.9 (*)    Abs Immature Granulocytes 0.13 (*)    All other components within normal limits  PROTIME-INR - Abnormal; Notable for the following components:   Prothrombin Time 24.1 (*)    INR 2.2 (*)    All other components within normal limits  LIPASE, BLOOD - Abnormal; Notable for the following components:   Lipase 119 (*)    All other components within normal limits  RESP PANEL BY RT-PCR (FLU A&B, COVID) ARPGX2  LACTIC ACID, PLASMA  APTT  TYPE AND SCREEN  PREPARE RBC (CROSSMATCH)  TROPONIN I (HIGH SENSITIVITY)   ____________________________________________   ____________________________________________  RADIOLOGY Almeta Monas, personally viewed and evaluated these images (plain radiographs) as part of my medical decision making, as well as reviewing the written report by the radiologist.  ED MD interpretation: I reviewed the CT of the abdomen pelvis which is read by radiology as potentially gastritis and some nonspecific colitis    ____________________________________________   PROCEDURES  Procedure(s) performed (including Critical Care):  .Critical Care Performed by: Rada Hay, MD Authorized by: Rada Hay, MD   Critical care provider statement:    Critical care time (minutes):  45   Critical care was necessary to treat or prevent imminent or life-threatening  deterioration of the following conditions:  Shock and circulatory failure   Critical care was time spent personally by me on the following activities:  Discussions with consultants, evaluation of patient's response to treatment, examination of patient, ordering and performing treatments and interventions, ordering and review of laboratory studies, ordering and review of radiographic studies, pulse oximetry, re-evaluation of patient's condition, obtaining history from patient or surrogate and review of old charts   ____________________________________________   INITIAL IMPRESSION / ASSESSMENT AND PLAN / ED COURSE     75 year old male presents with an upper GI bleed.  He is having melena and hematemesis.  On arrival he is hypotensive 70/50s. He is pale and ill-appearing but is alert and oriented.  On exam he has gross melena.  I ordered 500 cc of saline to temporize and will order 2 units of uncrossed match blood.  Blood bank aware.  We will give Protonix postinfusion and given I am not sure when he had his last dose of DOAC will give Kcentra.  With Dr. Allen Norris with GI who is aware and planning to  see.  Patient will need to be stabilized prior to endoscopy.  Patient on his second unit of uncrossed match blood.  Blood pressure has not really changed much.  His lipase and white blood cell count were elevated so I did obtain CT of the abdomen pelvis to rule out any other infectious process that be contributing to shock.  The CT shows some inflammation of the stomach which is likely gastritis as well as some nonspecific colitis.  Will cover empirically with ceftriaxone Flagyl.  At this point he remains hypotensive, and blood.  Has not had any ongoing bleeding in the ED which makes me wonder if there is another process going on as well.  I would like to avoid aggressive fluid resuscitation because he is already anemic.  I suspect that his blood pressure will improve after more blood.   Discussed the case with  Dr. Patsey Berthold the intensivist who does think he would benefit from additional fluids.  We will give another liter and see if his blood pressure leveled off.  If not we will start Levophed.  Patient evaluated by Dr. Allen Norris with GI who thinks that this is most likely bleeding related to Mallory-Weiss tear, Caryl Pina given he has not had any ongoing bleeding.  Patient also evaluated by Dr. Patsey Berthold with ICU who feels that he is appropriate for the floor.      ____________________________________________   FINAL CLINICAL IMPRESSION(S) / ED DIAGNOSES  Final diagnoses:  Upper GI bleed     ED Discharge Orders     None        Note:  This document was prepared using Dragon voice recognition software and may include unintentional dictation errors.    Rada Hay, MD 07/15/21 [phone]

## 2021-07-16 DIAGNOSIS — K922 Gastrointestinal hemorrhage, unspecified: Secondary | ICD-10-CM | POA: Diagnosis not present

## 2021-07-16 DIAGNOSIS — R1084 Generalized abdominal pain: Secondary | ICD-10-CM | POA: Diagnosis not present

## 2021-07-16 DIAGNOSIS — N1831 Chronic kidney disease, stage 3a: Secondary | ICD-10-CM | POA: Diagnosis not present

## 2021-07-16 DIAGNOSIS — R58 Hemorrhage, not elsewhere classified: Secondary | ICD-10-CM | POA: Diagnosis not present

## 2021-07-16 DIAGNOSIS — R109 Unspecified abdominal pain: Secondary | ICD-10-CM

## 2021-07-16 DIAGNOSIS — I9589 Other hypotension: Secondary | ICD-10-CM | POA: Diagnosis not present

## 2021-07-16 LAB — COMPREHENSIVE METABOLIC PANEL
ALT: 8 U/L (ref 0–44)
AST: 10 U/L — ABNORMAL LOW (ref 15–41)
Albumin: 2.4 g/dL — ABNORMAL LOW (ref 3.5–5.0)
Alkaline Phosphatase: 52 U/L (ref 38–126)
Anion gap: 9 (ref 5–15)
BUN: 35 mg/dL — ABNORMAL HIGH (ref 8–23)
CO2: 19 mmol/L — ABNORMAL LOW (ref 22–32)
Calcium: 7.7 mg/dL — ABNORMAL LOW (ref 8.9–10.3)
Chloride: 114 mmol/L — ABNORMAL HIGH (ref 98–111)
Creatinine, Ser: 1.48 mg/dL — ABNORMAL HIGH (ref 0.61–1.24)
GFR, Estimated: 48 mL/min — ABNORMAL LOW (ref >60)
Glucose, Bld: 122 mg/dL — ABNORMAL HIGH (ref 70–99)
Potassium: 3.7 mmol/L (ref 3.5–5.1)
Sodium: 142 mmol/L (ref 135–145)
Total Bilirubin: 0.7 mg/dL (ref 0.3–1.2)
Total Protein: 4.4 g/dL — ABNORMAL LOW (ref 6.5–8.1)

## 2021-07-16 LAB — MRSA NEXT GEN BY PCR, NASAL: MRSA by PCR Next Gen: DETECTED — AB

## 2021-07-16 LAB — CBC
HCT: 27 % — ABNORMAL LOW (ref 39.0–52.0)
Hemoglobin: 9.2 g/dL — ABNORMAL LOW (ref 13.0–17.0)
MCH: 30.7 pg (ref 26.0–34.0)
MCHC: 34.1 g/dL (ref 30.0–36.0)
MCV: 90 fL (ref 80.0–100.0)
Platelets: 142 10*3/uL — ABNORMAL LOW (ref 150–400)
RBC: 3 MIL/uL — ABNORMAL LOW (ref 4.22–5.81)
RDW: 15.2 % (ref 11.5–15.5)
WBC: 12 10*3/uL — ABNORMAL HIGH (ref 4.0–10.5)
nRBC: 0 % (ref 0.0–0.2)

## 2021-07-16 LAB — GLUCOSE, CAPILLARY
Glucose-Capillary: 127 mg/dL — ABNORMAL HIGH (ref 70–99)
Glucose-Capillary: 182 mg/dL — ABNORMAL HIGH (ref 70–99)
Glucose-Capillary: 114 mg/dL — ABNORMAL HIGH (ref 70–99)
Glucose-Capillary: 116 mg/dL — ABNORMAL HIGH (ref 70–99)

## 2021-07-16 LAB — PREPARE RBC (CROSSMATCH)

## 2021-07-16 LAB — MAGNESIUM: Magnesium: 1.7 mg/dL (ref 1.7–2.4)

## 2021-07-16 MED ORDER — MAGNESIUM SULFATE 2 GM/50ML IV SOLN
2.0000 g | Freq: Once | INTRAVENOUS | Status: AC
  Administered 2021-07-16: 2 g via INTRAVENOUS
  Filled 2021-07-16: qty 50

## 2021-07-16 MED ORDER — SODIUM CHLORIDE 0.9 % IV BOLUS
250.0000 mL | Freq: Once | INTRAVENOUS | Status: AC
  Administered 2021-07-16: 250 mL via INTRAVENOUS

## 2021-07-16 MED ORDER — MIDODRINE HCL 5 MG PO TABS
10.0000 mg | ORAL_TABLET | Freq: Three times a day (TID) | ORAL | Status: DC
  Administered 2021-07-16 – 2021-07-20 (×9): 10 mg via ORAL
  Filled 2021-07-16 (×9): qty 2

## 2021-07-16 MED ORDER — SODIUM CHLORIDE 0.9 % IV BOLUS
1000.0000 mL | Freq: Once | INTRAVENOUS | Status: AC
  Administered 2021-07-16: 1000 mL via INTRAVENOUS

## 2021-07-16 MED ORDER — MUPIROCIN 2 % EX OINT
TOPICAL_OINTMENT | Freq: Two times a day (BID) | CUTANEOUS | Status: DC
  Administered 2021-07-20: 1 via NASAL
  Filled 2021-07-16: qty 22

## 2021-07-16 MED ORDER — FUROSEMIDE 10 MG/ML IJ SOLN
40.0000 mg | Freq: Once | INTRAMUSCULAR | Status: AC
  Administered 2021-07-16: 40 mg via INTRAVENOUS
  Filled 2021-07-16: qty 4

## 2021-07-16 MED ORDER — CHLORHEXIDINE GLUCONATE CLOTH 2 % EX PADS
6.0000 | MEDICATED_PAD | Freq: Every day | CUTANEOUS | Status: DC
  Administered 2021-07-16 – 2021-07-20 (×5): 6 via TOPICAL

## 2021-07-16 NOTE — ED Notes (Signed)
MD aware of persistent & increasing hypotension despite fluid bolus & blood transfusion.   No evidence of active exanguination. Pt has not had BM nor emesis at this time.    Awaiting orders.

## 2021-07-16 NOTE — Progress Notes (Addendum)
PROGRESS NOTE    Shawn Jennings  IOE:[id number] DOB: Aug 30, 1946 DOA: 07/15/2021 PCP: [facility]  Assessment & Plan:   Principal Problem:   GI bleed Active Problems:   Chronic kidney disease, stage 3a ([place])   Essential hypertension   Coronary artery disease of native artery of native heart with stable angina pectoris ([place])   Protein-calorie malnutrition, severe   AF (paroxysmal atrial fibrillation) (H[facility])   On anticoagulant therapy   Depression   GERD (gastroesophageal reflux disease)   Tobacco abuse   Hyperlipidemia   Abdominal pain  GI bleed: etiology unclear, ddx gastric ulcer vs. Mallory-Weiss tear. Clear liquid diet and NPO after midnight. Continue on PPI drip. No EGD today as per GI. Continue on IVFs. S/p 2 units of pRBCs given. GI following.  Hypotension: likely secondary to GI bleed. Keep MAP >65. Started on midodrine    Acute blood loss anemia: w/ hx of normocytic anemia. S/p 2 units of pRBCs transfused. Continue to monitor H&H   Leukocytosis: likely reactive  Thrombocytopenia: etiology unclear. Will continue to monitor   Likely CKD: baseline Cr/GFR is unknown, currently stage IIIa. Cr is trending down from day prior   Depression: severity unknown. Continue on home dose of cymbalta, lamotrigine  Anxiety: severity unknown. Continue on home dose of lorazepam    DM2: likely well controlled. Continue on SSI w/ accuchecks    Hx of CAD: continue on statin. Continue to hold aspirin, coreg, imdur, entresto    HLD: continue on statin    PAF: continue to hold xarelto, coreg    Insomnia: continue on melatonin   Tobacco abuse: nicotine patch to prevent w/drawal. Smoking cessation counseling    PAD: s/p AKA. Continue w/ supportive care   DVT prophylaxis: SCD Code Status: partial  Family Communication:  Disposition Plan:  likely d/c back to home facility   Level of care: Stepdown  Status is: Inpatient  Remains inpatient appropriate  because:Unsafe d/c plan, IV treatments appropriate due to intensity of illness or inability to take PO, and Inpatient level of care appropriate due to severity of illness  Dispo: The patient is from:  home facility              Anticipated d/c is to:  home facility               Patient currently is not medically stable to d/c.   Difficult to place patient : unclear    Consultants:  GI   Procedures:  Antimicrobials:   Subjective: Pt c/o fatigue   Objective: Vitals:   07/16/21 0900 07/16/21 1000 07/16/21 1100 07/16/21 1200  BP: (!) 92/44 (!) 102/45 (!) 119/43 (!) 101/51  Pulse: (!) 58 (!) 54  (!) 52  Resp: 17 14 18 18   Temp:      TempSrc:      SpO2: 98% 98% 98% 99%  Weight:        Intake/Output Summary (Last 24 hours) at 07/16/2021 1325 Last data filed at 07/16/2021 1200 Gross per 24 hour  Intake 5074.36 ml  Output 2400 ml  Net 2674.36 ml   Filed Weights   07/15/21 1049  Weight: 67 kg    Examination:  General exam: Appears calm and comfortable  Respiratory system: Clear to auscultation. Respiratory effort normal. Cardiovascular system: S1 & S2+. No  rubs, gallops or clicks.  Gastrointestinal system: Abdomen is nondistended, soft and nontender. Normal bowel sounds heard. Central nervous system: Alert and oriented. Moves all 3 extremities & LE stump  Psychiatry: Judgement and insight appear normal. Flat mood and affect     Data Reviewed: I have personally reviewed following labs and imaging studies  CBC: Recent Labs  Lab 07/15/21 1046 07/15/21 1510 07/15/21 2237 07/16/21 0555  WBC 15.1* 14.3*  --  12.0*  NEUTROABS 11.9* 10.9*  --   --   HGB 7.1* 9.6* 8.2* 9.2*  HCT 21.6* 28.2* 24.6* 27.0*  MCV 90.4 88.4  --  90.0  PLT 212 184  --  858*   Basic Metabolic Panel: Recent Labs  Lab 07/15/21 1046 07/16/21 0555  NA 138 142  K 4.1 3.7  CL 107 114*  CO2 22 19*  GLUCOSE 170* 122*  BUN 48* 35*  CREATININE 1.65* 1.48*  CALCIUM 8.7* 7.7*  MG  --  1.7    GFR: Estimated Creatinine Clearance: 39.6 mL/min (A) (by C-G formula based on SCr of 1.48 mg/dL (H)). Liver Function Tests: Recent Labs  Lab 07/15/21 1046 07/16/21 0555  AST 15 10*  ALT 10 8  ALKPHOS 70 52  BILITOT 0.6 0.7  PROT 5.9* 4.4*  ALBUMIN 3.2* 2.4*   Recent Labs  Lab 07/15/21 1048  LIPASE 119*   No results for input(s): AMMONIA in the last 168 hours. Coagulation Profile: Recent Labs  Lab 07/15/21 1046  INR 2.2*   Cardiac Enzymes: No results for input(s): CKTOTAL, CKMB, CKMBINDEX, TROPONINI in the last 168 hours. BNP (last 3 results) No results for input(s): PROBNP in the last 8760 hours. HbA1C: No results for input(s): HGBA1C in the last 72 hours. CBG: Recent Labs  Lab 07/15/21 1811 07/15/21 2043 07/16/21 0755 07/16/21 1131  GLUCAP 151* 181* 127* 182*   Lipid Profile: No results for input(s): CHOL, HDL, LDLCALC, TRIG, CHOLHDL, LDLDIRECT in the last 72 hours. Thyroid Function Tests: No results for input(s): TSH, T4TOTAL, FREET4, T3FREE, THYROIDAB in the last 72 hours. Anemia Panel: No results for input(s): VITAMINB12, FOLATE, FERRITIN, TIBC, IRON, RETICCTPCT in the last 72 hours. Sepsis Labs: Recent Labs  Lab 07/15/21 1046 07/15/21 1839  PROCALCITON  --  <0.10  LATICACIDVEN 1.9  --     Recent Results (from the past 240 hour(s))  Resp Panel by RT-PCR (Flu A&B, Covid) Nasopharyngeal Swab     Status: None   Collection Time: 07/15/21  2:46 PM   Specimen: Nasopharyngeal Swab; Nasopharyngeal(NP) swabs in vial transport medium  Result Value Ref Range Status   SARS Coronavirus 2 by RT PCR NEGATIVE NEGATIVE Final    Comment: (NOTE) SARS-CoV-2 target nucleic acids are NOT DETECTED.  The SARS-CoV-2 RNA is generally detectable in upper respiratory specimens during the acute phase of infection. The lowest concentration of SARS-CoV-2 viral copies this assay can detect is 138 copies/mL. A negative result does not preclude SARS-Cov-2 infection and  should not be used as the sole basis for treatment or other patient management decisions. A negative result may occur with  improper specimen collection/handling, submission of specimen other than nasopharyngeal swab, presence of viral mutation(s) within the areas targeted by this assay, and inadequate number of viral copies(<138 copies/mL). A negative result must be combined with clinical observations, patient history, and epidemiological information. The expected result is Negative.  Fact Sheet for Patients:  EntrepreneurPulse.com.au  Fact Sheet for Healthcare Providers:  IncredibleEmployment.be  This test is no t yet approved or cleared by the [place] FDA and  has been authorized for detection and/or diagnosis of SARS-CoV-2 by FDA under an Emergency Use Authorization (EUA). This EUA will remain  in  effect (meaning this test can be used) for the duration of the COVID-19 declaration under Section 564(b)(1) of the Act, 21 U.S.C.section 360bbb-3(b)(1), unless the authorization is terminated  or revoked sooner.       Influenza A by PCR NEGATIVE NEGATIVE Final   Influenza B by PCR NEGATIVE NEGATIVE Final    Comment: (NOTE) The Xpert Xpress SARS-CoV-2/FLU/RSV plus assay is intended as an aid in the diagnosis of influenza from Nasopharyngeal swab specimens and should not be used as a sole basis for treatment. Nasal washings and aspirates are unacceptable for Xpert Xpress SARS-CoV-2/FLU/RSV testing.  Fact Sheet for Patients: EntrepreneurPulse.com.au  Fact Sheet for Healthcare Providers: IncredibleEmployment.be  This test is not yet approved or cleared by the [place] FDA and has been authorized for detection and/or diagnosis of SARS-CoV-2 by FDA under an Emergency Use Authorization (EUA). This EUA will remain in effect (meaning this test can be used) for the duration of the COVID-19 declaration  under Section 564(b)(1) of the Act, 21 U.S.C. section 360bbb-3(b)(1), unless the authorization is terminated or revoked.  Performed at [facility], [address] [place], [place] [address]          Radiology Studies: CT ABDOMEN PELVIS W CONTRAST  Result Date: 07/15/2021 CLINICAL DATA:  Abdominal pain, acute, nonlocalized, emesis EXAM: CT ABDOMEN AND PELVIS WITH CONTRAST TECHNIQUE: Multidetector CT imaging of the abdomen and pelvis was performed using the standard protocol following bolus administration of intravenous contrast. CONTRAST:  33mL OMNIPAQUE IOHEXOL 350 MG/ML SOLN COMPARISON:  10/17/2020 FINDINGS: Lower chest: Reticulonodular opacities within the dependent portion of the right lower lobe. Prosthetic aortic valve. Coronary artery calcification. Hepatobiliary: No focal liver abnormality is identified. Prior cholecystectomy. Resolved biliary dilatation. No residual stones are seen within the common bile duct. Pancreas: Unremarkable. No pancreatic ductal dilatation or surrounding inflammatory changes. Spleen: Normal in size without focal abnormality. Adrenals/Urinary Tract: Unremarkable adrenal glands. Calcifications within the bilateral renal hila are favored to represent vascular calcification. Subcentimeter low-density lesions within each kidney, likely cysts. No hydronephrosis. Bilateral ureters are unremarkable. Urinary bladder within normal limits. Stomach/Bowel: There is gastric wall thickening along the greater curvature. Small periampullary duodenal diverticulum is again noted. Anastomotic sutures in the left upper quadrant. No dilated loops of bowel to suggest obstruction. Normal appendix in the right lower quadrant (series 2, image 59). Mild long segment thickening of the rectosigmoid colon. No focally thickened segment. No pericolonic inflammatory changes. Vascular/Lymphatic: Advanced atherosclerotic calcifications throughout the aortoiliac axis without aneurysm.  Chronically occluded left SFA stent. No abdominopelvic lymphadenopathy. Reproductive: Prostate is unremarkable. Other: No free fluid. No abdominopelvic fluid collection. No pneumoperitoneum. No abdominal wall hernia. Musculoskeletal: No acute or significant osseous findings. IMPRESSION: 1. Gastric wall thickening along the greater curvature, which may represent gastritis. 2. Mild long segment thickening of the rectosigmoid colon may represent a nonspecific colitis. 3. Reticulonodular opacities within the dependent portion of the right lower lobe may represent an infectious or inflammatory process versus aspiration. 4. Advanced aortoiliac atherosclerosis (ICD10-I70.0). Electronically Signed   By: Davina Poke D.O.   On: 07/15/2021 12:37   DG Chest Port 1 View  Result Date: 07/15/2021 CLINICAL DATA:  Abdominal pain.  Nausea and vomiting. EXAM: PORTABLE CHEST 1 VIEW COMPARISON:  Radiograph earlier today. Lung bases from abdominopelvic CT earlier today. FINDINGS: No definite radiographic correlate for reticulonodular opacities in the lower right lung base on CT, there is no focal airspace disease by radiograph. Stable heart size and mediastinal contours, post median sternotomy and aortic  valve replacement. Mild vascular congestion. No pleural effusion or pneumothorax. IMPRESSION: 1. Mild vascular congestion. 2. No radiographic correlate for reticulonodular opacities in the lower right lung on abdominal CT earlier this day. Electronically Signed   By: Keith Rake M.D.   On: 07/15/2021 23:48   DG Chest Port 1 View  Result Date: 07/15/2021 CLINICAL DATA:  Nausea vomiting for 3 days.  Black emesis. EXAM: PORTABLE CHEST 1 VIEW COMPARISON:  11/07/2020. FINDINGS: Stable changes from prior cardiac surgery. No mediastinal or hilar masses. No evidence of adenopathy. Clear lungs.  No pleural effusion or pneumothorax. Skeletal structures grossly intact. Old healed left clavicle fracture. IMPRESSION: No acute  cardiopulmonary disease. Electronically Signed   By: Lajean Manes M.D.   On: 07/15/2021 14:44   DG Abd Portable 1V  Result Date: 07/15/2021 CLINICAL DATA:  Abdominal pain.  Nausea and vomiting. EXAM: PORTABLE ABDOMEN - 1 VIEW COMPARISON:  Abdominopelvic CT earlier today. FINDINGS: Divided supine view of the abdomen obtained. Featureless loop of air-filled bowel in the central pelvis not seen on CT earlier today, 4.1 cm. Moderate stool in the right colon. No definite radiographic correlate for the rectosigmoid wall thickening on CT. There is excreted contrast in the urinary bladder. Advanced aortic and branch atherosclerosis. Cholecystectomy clips in the right upper quadrant. IMPRESSION: Featureless loop of air-filled bowel in the central pelvis not seen on CT earlier today. This may represent new small bowel dilatation or gaseous distension of inflamed sigmoid colon given rectosigmoid wall thickening on earlier CT. Electronically Signed   By: Keith Rake M.D.   On: 07/15/2021 23:50        Scheduled Meds:  atorvastatin  80 mg Oral QHS   Chlorhexidine Gluconate Cloth  6 each Topical Daily   DULoxetine  30 mg Oral BID   insulin aspart  0-5 Units Subcutaneous QHS   insulin aspart  0-9 Units Subcutaneous TID WC   lamoTRIgine  25 mg Oral BID   melatonin  2.5 mg Oral QHS   midodrine  10 mg Oral TID WC   mometasone-formoterol  2 puff Inhalation BID   montelukast  10 mg Oral QHS   vitamin B-12  1,000 mcg Oral Daily   Continuous Infusions:  sodium chloride 100 mL/hr at 07/16/21 1200   pantoprazole 8 mg/hr (07/16/21 1200)     LOS: 1 day    Time spent: 33 mins     Wyvonnia Dusky, MD [facility] Pager [phone]  If 7PM-7AM, please contact night-coverage  07/16/2021, 1:25 PM

## 2021-07-16 NOTE — Progress Notes (Signed)
[name], MD [facility]   [address], [place], [name] [address] Phone: [phone] Fax : [phone]   Subjective: The patient had an episode last night of hypotension pallor and severe abdominal pain.  It was thought that he was having a repeat GI bleed but since last night until this afternoon the patient has had no sign of any GI bleeding such as hematemesis or melena as he did before.  The patient has been started on multiple medications and given a liter of fluid trying to get his blood pressure stabilized.  The patient continues to have a low blood pressure.  Today he denies any abdominal pain whatsoever but reports that was very significant yesterday evening.   Objective: Vital signs in last 24 hours: Vitals:   07/16/21 0700 07/16/21 0800 07/16/21 0900 07/16/21 1000  BP: (!) 99/48 (!) 95/46 (!) 92/44 (!) 102/45  Pulse: 70 63 (!) 58 (!) 54  Resp: 16 (!) 21 17 14   Temp:  98.2 F (36.8 C)    TempSrc:  Oral    SpO2: 97% 100% 98% 98%  Weight:       Weight change:   Intake/Output Summary (Last 24 hours) at 07/16/2021 1212 Last data filed at 07/16/2021 1000 Gross per 24 hour  Intake 5582.8 ml  Output 2400 ml  Net 3182.8 ml     Exam: Heart:: Regular rate and rhythm, S1S2 present, or without murmur or extra heart sounds Lungs: normal and clear to auscultation and percussion Abdomen: soft, nontender, normal bowel sounds   Lab Results: @LABTEST2 @ Micro Results: Recent Results (from the past 240 hour(s))  Resp Panel by RT-PCR (Flu A&B, Covid) Nasopharyngeal Swab     Status: None   Collection Time: 07/15/21  2:46 PM   Specimen: Nasopharyngeal Swab; Nasopharyngeal(NP) swabs in vial transport medium  Result Value Ref Range Status   SARS Coronavirus 2 by RT PCR NEGATIVE NEGATIVE Final    Comment: (NOTE) SARS-CoV-2 target nucleic acids are NOT DETECTED.  The SARS-CoV-2 RNA is generally detectable in upper respiratory specimens during the acute phase of infection.  The lowest concentration of SARS-CoV-2 viral copies this assay can detect is 138 copies/mL. A negative result does not preclude SARS-Cov-2 infection and should not be used as the sole basis for treatment or other patient management decisions. A negative result may occur with  improper specimen collection/handling, submission of specimen other than nasopharyngeal swab, presence of viral mutation(s) within the areas targeted by this assay, and inadequate number of viral copies(<138 copies/mL). A negative result must be combined with clinical observations, patient history, and epidemiological information. The expected result is Negative.  Fact Sheet for Patients:  EntrepreneurPulse.com.au  Fact Sheet for Healthcare Providers:  IncredibleEmployment.be  This test is no t yet approved or cleared by the [place] FDA and  has been authorized for detection and/or diagnosis of SARS-CoV-2 by FDA under an Emergency Use Authorization (EUA). This EUA will remain  in effect (meaning this test can be used) for the duration of the COVID-19 declaration under Section 564(b)(1) of the Act, 21 U.S.C.section 360bbb-3(b)(1), unless the authorization is terminated  or revoked sooner.       Influenza A by PCR NEGATIVE NEGATIVE Final   Influenza B by PCR NEGATIVE NEGATIVE Final    Comment: (NOTE) The Xpert Xpress SARS-CoV-2/FLU/RSV plus assay is intended as an aid in the diagnosis of influenza from Nasopharyngeal swab specimens and should not be used as a sole basis for treatment. Nasal washings and aspirates  are unacceptable for Xpert Xpress SARS-CoV-2/FLU/RSV testing.  Fact Sheet for Patients: EntrepreneurPulse.com.au  Fact Sheet for Healthcare Providers: IncredibleEmployment.be  This test is not yet approved or cleared by the [place] FDA and has been authorized for detection and/or diagnosis of SARS-CoV-2 by FDA  under an Emergency Use Authorization (EUA). This EUA will remain in effect (meaning this test can be used) for the duration of the COVID-19 declaration under Section 564(b)(1) of the Act, 21 U.S.C. section 360bbb-3(b)(1), unless the authorization is terminated or revoked.  Performed at [facility], [address] [place], [place] [address]    Studies/Results: CT ABDOMEN PELVIS W CONTRAST  Result Date: 07/15/2021 CLINICAL DATA:  Abdominal pain, acute, nonlocalized, emesis EXAM: CT ABDOMEN AND PELVIS WITH CONTRAST TECHNIQUE: Multidetector CT imaging of the abdomen and pelvis was performed using the standard protocol following bolus administration of intravenous contrast. CONTRAST:  13mL OMNIPAQUE IOHEXOL 350 MG/ML SOLN COMPARISON:  10/17/2020 FINDINGS: Lower chest: Reticulonodular opacities within the dependent portion of the right lower lobe. Prosthetic aortic valve. Coronary artery calcification. Hepatobiliary: No focal liver abnormality is identified. Prior cholecystectomy. Resolved biliary dilatation. No residual stones are seen within the common bile duct. Pancreas: Unremarkable. No pancreatic ductal dilatation or surrounding inflammatory changes. Spleen: Normal in size without focal abnormality. Adrenals/Urinary Tract: Unremarkable adrenal glands. Calcifications within the bilateral renal hila are favored to represent vascular calcification. Subcentimeter low-density lesions within each kidney, likely cysts. No hydronephrosis. Bilateral ureters are unremarkable. Urinary bladder within normal limits. Stomach/Bowel: There is gastric wall thickening along the greater curvature. Small periampullary duodenal diverticulum is again noted. Anastomotic sutures in the left upper quadrant. No dilated loops of bowel to suggest obstruction. Normal appendix in the right lower quadrant (series 2, image 59). Mild long segment thickening of the rectosigmoid colon. No focally thickened segment. No  pericolonic inflammatory changes. Vascular/Lymphatic: Advanced atherosclerotic calcifications throughout the aortoiliac axis without aneurysm. Chronically occluded left SFA stent. No abdominopelvic lymphadenopathy. Reproductive: Prostate is unremarkable. Other: No free fluid. No abdominopelvic fluid collection. No pneumoperitoneum. No abdominal wall hernia. Musculoskeletal: No acute or significant osseous findings. IMPRESSION: 1. Gastric wall thickening along the greater curvature, which may represent gastritis. 2. Mild long segment thickening of the rectosigmoid colon may represent a nonspecific colitis. 3. Reticulonodular opacities within the dependent portion of the right lower lobe may represent an infectious or inflammatory process versus aspiration. 4. Advanced aortoiliac atherosclerosis (ICD10-I70.0). Electronically Signed   By: Davina Poke D.O.   On: 07/15/2021 12:37   DG Chest Port 1 View  Result Date: 07/15/2021 CLINICAL DATA:  Abdominal pain.  Nausea and vomiting. EXAM: PORTABLE CHEST 1 VIEW COMPARISON:  Radiograph earlier today. Lung bases from abdominopelvic CT earlier today. FINDINGS: No definite radiographic correlate for reticulonodular opacities in the lower right lung base on CT, there is no focal airspace disease by radiograph. Stable heart size and mediastinal contours, post median sternotomy and aortic valve replacement. Mild vascular congestion. No pleural effusion or pneumothorax. IMPRESSION: 1. Mild vascular congestion. 2. No radiographic correlate for reticulonodular opacities in the lower right lung on abdominal CT earlier this day. Electronically Signed   By: Keith Rake M.D.   On: 07/15/2021 23:48   DG Chest Port 1 View  Result Date: 07/15/2021 CLINICAL DATA:  Nausea vomiting for 3 days.  Black emesis. EXAM: PORTABLE CHEST 1 VIEW COMPARISON:  11/07/2020. FINDINGS: Stable changes from prior cardiac surgery. No mediastinal or hilar masses. No evidence of adenopathy. Clear  lungs.  No pleural effusion or  pneumothorax. Skeletal structures grossly intact. Old healed left clavicle fracture. IMPRESSION: No acute cardiopulmonary disease. Electronically Signed   By: Lajean Manes M.D.   On: 07/15/2021 14:44   DG Abd Portable 1V  Result Date: 07/15/2021 CLINICAL DATA:  Abdominal pain.  Nausea and vomiting. EXAM: PORTABLE ABDOMEN - 1 VIEW COMPARISON:  Abdominopelvic CT earlier today. FINDINGS: Divided supine view of the abdomen obtained. Featureless loop of air-filled bowel in the central pelvis not seen on CT earlier today, 4.1 cm. Moderate stool in the right colon. No definite radiographic correlate for the rectosigmoid wall thickening on CT. There is excreted contrast in the urinary bladder. Advanced aortic and branch atherosclerosis. Cholecystectomy clips in the right upper quadrant. IMPRESSION: Featureless loop of air-filled bowel in the central pelvis not seen on CT earlier today. This may represent new small bowel dilatation or gaseous distension of inflamed sigmoid colon given rectosigmoid wall thickening on earlier CT. Electronically Signed   By: Keith Rake M.D.   On: 07/15/2021 23:50   Medications: I have reviewed the patient's current medications. Scheduled Meds:  atorvastatin  80 mg Oral QHS   Chlorhexidine Gluconate Cloth  6 each Topical Daily   DULoxetine  30 mg Oral BID   insulin aspart  0-5 Units Subcutaneous QHS   insulin aspart  0-9 Units Subcutaneous TID WC   lamoTRIgine  25 mg Oral BID   melatonin  2.5 mg Oral QHS   midodrine  10 mg Oral TID WC   mometasone-formoterol  2 puff Inhalation BID   montelukast  10 mg Oral QHS   vitamin B-12  1,000 mcg Oral Daily   Continuous Infusions:  sodium chloride 100 mL/hr at 07/16/21 1105   pantoprazole 8 mg/hr (07/16/21 1105)   PRN Meds:.acetaminophen **OR** acetaminophen, albuterol, LORazepam, morphine injection, nicotine, nitroGLYCERIN, ondansetron **OR** ondansetron (ZOFRAN) IV   Assessment: Principal  Problem:   GI bleed Active Problems:   Chronic kidney disease, stage 3a ([place])   Essential hypertension   Coronary artery disease of native artery of native heart with stable angina pectoris (HCC)   Protein-calorie malnutrition, severe   AF (paroxysmal atrial fibrillation) (HCC)   On anticoagulant therapy   Depression   GERD (gastroesophageal reflux disease)   Tobacco abuse   Hyperlipidemia    Plan: This patient had an episode thought to be a GI bleed last night with a drop in hemoglobin but no sign of any GI bleeding from his rectum or any hematemesis.  The patient is completely pain-free this morning.  The patient had a KUB and a chest x-ray that did not show any source for his significant abdominal pain and anemia.  In light of the patient needing multiple transfusions and a drop in his hemoglobin despite no sign of any GI bleeding other sources of blood loss should be entertained such as retroperitoneal bleed or extraintestinal bleeding.  The patient's NG tube could not be placed yesterday and he refused it.  With his low blood pressure endoscopic intervention at this time is on hold.  The patient has been explained the plan and agrees with it.   LOS: 1 day   [name], Marval Regal 07/16/2021, 12:12 PM Pager [phone] 7am-5pm  Check AMION for 5pm -7am coverage and on weekends

## 2021-07-16 NOTE — ED Notes (Signed)
Report given to [facility] RN - All questions answered.

## 2021-07-16 NOTE — ED Notes (Signed)
2 RN attempt at NG placement - Dee & Melissa RN's.    Unsuccessful x4 attempts.   Pt states he does not want NG tube. Adamantly declines at this time.  MD notified.

## 2021-07-17 DIAGNOSIS — K922 Gastrointestinal hemorrhage, unspecified: Secondary | ICD-10-CM | POA: Diagnosis not present

## 2021-07-17 DIAGNOSIS — N1831 Chronic kidney disease, stage 3a: Secondary | ICD-10-CM | POA: Diagnosis not present

## 2021-07-17 DIAGNOSIS — R58 Hemorrhage, not elsewhere classified: Secondary | ICD-10-CM

## 2021-07-17 DIAGNOSIS — I9589 Other hypotension: Secondary | ICD-10-CM

## 2021-07-17 LAB — TYPE AND SCREEN
ABO/RH(D): B POS
Antibody Screen: NEGATIVE
Unit division: 0
Unit division: 0
Unit division: 0
Unit division: 0
Unit division: 0
Unit division: 0

## 2021-07-17 LAB — CBC
HCT: 30.1 % — ABNORMAL LOW (ref 39.0–52.0)
Hemoglobin: 10.1 g/dL — ABNORMAL LOW (ref 13.0–17.0)
MCH: 30.9 pg (ref 26.0–34.0)
MCHC: 33.6 g/dL (ref 30.0–36.0)
MCV: 92 fL (ref 80.0–100.0)
Platelets: 157 10*3/uL (ref 150–400)
RBC: 3.27 MIL/uL — ABNORMAL LOW (ref 4.22–5.81)
RDW: 16.1 % — ABNORMAL HIGH (ref 11.5–15.5)
WBC: 15.6 10*3/uL — ABNORMAL HIGH (ref 4.0–10.5)
nRBC: 0 % (ref 0.0–0.2)

## 2021-07-17 LAB — BPAM RBC
Blood Product Expiration Date: 202210122359
Blood Product Expiration Date: 202210122359
Blood Product Expiration Date: 202210182359
Blood Product Expiration Date: 202210262359
Blood Product Expiration Date: 202210262359
Blood Product Expiration Date: 202210292359
ISSUE DATE / TIME: 202209241106
ISSUE DATE / TIME: 202209241106
ISSUE DATE / TIME: 202209250013
Unit Type and Rh: 5100
Unit Type and Rh: 5100
Unit Type and Rh: 5100
Unit Type and Rh: 5100
Unit Type and Rh: 5100
Unit Type and Rh: 7300

## 2021-07-17 LAB — BASIC METABOLIC PANEL
Anion gap: 9 (ref 5–15)
BUN: 26 mg/dL — ABNORMAL HIGH (ref 8–23)
CO2: 19 mmol/L — ABNORMAL LOW (ref 22–32)
Calcium: 8.2 mg/dL — ABNORMAL LOW (ref 8.9–10.3)
Chloride: 115 mmol/L — ABNORMAL HIGH (ref 98–111)
Creatinine, Ser: 1.48 mg/dL — ABNORMAL HIGH (ref 0.61–1.24)
GFR, Estimated: 48 mL/min — ABNORMAL LOW (ref >60)
Glucose, Bld: 185 mg/dL — ABNORMAL HIGH (ref 70–99)
Potassium: 4.1 mmol/L (ref 3.5–5.1)
Sodium: 143 mmol/L (ref 135–145)

## 2021-07-17 LAB — GLUCOSE, CAPILLARY
Glucose-Capillary: 120 mg/dL — ABNORMAL HIGH (ref 70–99)
Glucose-Capillary: 128 mg/dL — ABNORMAL HIGH (ref 70–99)
Glucose-Capillary: 140 mg/dL — ABNORMAL HIGH (ref 70–99)
Glucose-Capillary: 102 mg/dL — ABNORMAL HIGH (ref 70–99)

## 2021-07-17 LAB — PREPARE RBC (CROSSMATCH)

## 2021-07-17 LAB — HEMOGLOBIN A1C
Hgb A1c MFr Bld: 6.5 % — ABNORMAL HIGH (ref 4.8–5.6)
Mean Plasma Glucose: 140 mg/dL

## 2021-07-17 MED ORDER — PROSOURCE PLUS PO LIQD
30.0000 mL | Freq: Two times a day (BID) | ORAL | Status: DC
  Administered 2021-07-17 – 2021-07-19 (×3): 30 mL via ORAL
  Filled 2021-07-17 (×7): qty 30

## 2021-07-17 MED ORDER — BOOST / RESOURCE BREEZE PO LIQD CUSTOM
1.0000 | Freq: Three times a day (TID) | ORAL | Status: DC
  Administered 2021-07-17 – 2021-07-19 (×5): 1 via ORAL

## 2021-07-17 MED ORDER — ADULT MULTIVITAMIN W/MINERALS CH
1.0000 | ORAL_TABLET | Freq: Every day | ORAL | Status: DC
  Administered 2021-07-17 – 2021-07-18 (×2): 1 via ORAL
  Filled 2021-07-17 (×2): qty 1

## 2021-07-17 MED ORDER — MORPHINE SULFATE (PF) 2 MG/ML IV SOLN
1.0000 mg | INTRAVENOUS | Status: DC | PRN
  Administered 2021-07-17: 1 mg via INTRAVENOUS
  Filled 2021-07-17: qty 1

## 2021-07-17 MED ORDER — INFLUENZA VAC A&B SA ADJ QUAD 0.5 ML IM PRSY
0.5000 mL | PREFILLED_SYRINGE | INTRAMUSCULAR | Status: DC
  Filled 2021-07-17: qty 0.5

## 2021-07-17 MED ORDER — OXYCODONE-ACETAMINOPHEN 5-325 MG PO TABS
1.0000 | ORAL_TABLET | Freq: Four times a day (QID) | ORAL | Status: DC | PRN
Start: 2021-07-17 — End: 2021-07-20
  Administered 2021-07-17: 1 via ORAL
  Filled 2021-07-17: qty 1

## 2021-07-17 NOTE — Progress Notes (Signed)
PROGRESS NOTE    Shawn Jennings  SMO:[id number] DOB: Feb 01, 1946 DOA: 07/15/2021 PCP: [facility]  Assessment & Plan:   Principal Problem:   GI bleed Active Problems:   Chronic kidney disease, stage 3a ([place])   Essential hypertension   Coronary artery disease of native artery of native heart with stable angina pectoris ([place])   Protein-calorie malnutrition, severe   AF (paroxysmal atrial fibrillation) ([facility])   On anticoagulant therapy   Depression   GERD (gastroesophageal reflux disease)   Tobacco abuse   Hyperlipidemia   Abdominal pain  GI bleed: etiology unclear, ddx gastric ulcer vs. Mallory-Weiss tear. Clear liquid diet NPO after 5AM tomorrow. EGD tomorrow as per. Continue on IV PPI. S/p 2 units of pRBCs given. Will continue to monitor H&H   Hypotension: likely secondary to GI bleed. Continue on midodrine. Keep MAP > 65. Improved    Acute blood loss anemia: w/ hx of normocytic anemia. S/p 2 units of pRBCs transfused. Continue to monitor H&H    Leukocytosis: likely reactive. No fevers. Will continue to monitor.   Thrombocytopenia: resolved   Likely CKD: baseline Cr/GFR is unknown, currently on stage IIIa. Cr is stable from day prior    Depression: severity unknown. Continue on home dose of lamotrigine, cymbalta  Anxiety: severity unknown. Continue on home dose of lorazepam    DM2: likely well controlled, HbA1c is pending. Continue on SSI w/ accuchecks   Hx of CAD: continue on statin. Continue to hold aspirin, coreg, imdur, entresto    HLD: continue on statin    PAF: continue to hold xarelto, coreg    Insomnia: continue on melatonin    Tobacco abuse: nicotine patch to prevent w/drawal. Smoking cessation counseling    PAD: s/p AKA. Continue w/ supportive care     DVT prophylaxis: SCD Code Status: partial  Family Communication:  Disposition Plan:  likely d/c back to home facility   Level of care: Stepdown  Status is: Inpatient  Remains  inpatient appropriate because:Unsafe d/c plan, IV treatments appropriate due to intensity of illness or inability to take PO, and Inpatient level of care appropriate due to severity of illness, EGD tomorrow   Dispo: The patient is from:  home facility              Anticipated d/c is to:  home facility               Patient currently is not medically stable to d/c.   Difficult to place patient : unclear    Consultants:  GI   Procedures:  Antimicrobials:   Subjective: Pt c/o knee pain   Objective: Vitals:   07/17/21 1100 07/17/21 1200 07/17/21 1300 07/17/21 1500  BP: 120/63 137/63 (!) 105/48   Pulse: (!) 58 65 (!) 55 (!) 57  Resp: 19 19 18 19   Temp: 98 F (36.7 C)     TempSrc: Oral     SpO2: 99% 93% 96% 100%  Weight:        Intake/Output Summary (Last 24 hours) at 07/17/2021 1540 Last data filed at 07/17/2021 0800 Gross per 24 hour  Intake 729.07 ml  Output 2600 ml  Net -1870.93 ml   Filed Weights   07/15/21 1049  Weight: 67 kg    Examination:  General exam: Appears comfortable  Respiratory system: clear breath sounds b/l  Cardiovascular system: S1/S2+. No rubs or clicks  Gastrointestinal system: Abd is soft, NT, ND & hypoactive bowel sounds  Central nervous system: Alert & oriented.  Moves all 3 extremities & LE stump  Psychiatry: Judgement and insight appear normal. Appropriate mood and affect    Data Reviewed: I have personally reviewed following labs and imaging studies  CBC: Recent Labs  Lab 07/15/21 1046 07/15/21 1510 07/15/21 2237 07/16/21 0555 07/17/21 0903  WBC 15.1* 14.3*  --  12.0* 15.6*  NEUTROABS 11.9* 10.9*  --   --   --   HGB 7.1* 9.6* 8.2* 9.2* 10.1*  HCT 21.6* 28.2* 24.6* 27.0* 30.1*  MCV 90.4 88.4  --  90.0 92.0  PLT 212 184  --  142* 761   Basic Metabolic Panel: Recent Labs  Lab 07/15/21 1046 07/16/21 0555 07/17/21 0903  NA 138 142 143  K 4.1 3.7 4.1  CL 107 114* 115*  CO2 22 19* 19*  GLUCOSE 170* 122* 185*  BUN 48* 35*  26*  CREATININE 1.65* 1.48* 1.48*  CALCIUM 8.7* 7.7* 8.2*  MG  --  1.7  --    GFR: Estimated Creatinine Clearance: 39.6 mL/min (A) (by C-G formula based on SCr of 1.48 mg/dL (H)). Liver Function Tests: Recent Labs  Lab 07/15/21 1046 07/16/21 0555  AST 15 10*  ALT 10 8  ALKPHOS 70 52  BILITOT 0.6 0.7  PROT 5.9* 4.4*  ALBUMIN 3.2* 2.4*   Recent Labs  Lab 07/15/21 1048  LIPASE 119*   No results for input(s): AMMONIA in the last 168 hours. Coagulation Profile: Recent Labs  Lab 07/15/21 1046  INR 2.2*   Cardiac Enzymes: No results for input(s): CKTOTAL, CKMB, CKMBINDEX, TROPONINI in the last 168 hours. BNP (last 3 results) No results for input(s): PROBNP in the last 8760 hours. HbA1C: No results for input(s): HGBA1C in the last 72 hours. CBG: Recent Labs  Lab 07/16/21 1131 07/16/21 1520 07/16/21 2105 07/17/21 0737 07/17/21 1210  GLUCAP 182* 114* 116* 128* 140*   Lipid Profile: No results for input(s): CHOL, HDL, LDLCALC, TRIG, CHOLHDL, LDLDIRECT in the last 72 hours. Thyroid Function Tests: No results for input(s): TSH, T4TOTAL, FREET4, T3FREE, THYROIDAB in the last 72 hours. Anemia Panel: No results for input(s): VITAMINB12, FOLATE, FERRITIN, TIBC, IRON, RETICCTPCT in the last 72 hours. Sepsis Labs: Recent Labs  Lab 07/15/21 1046 07/15/21 1839  PROCALCITON  --  <0.10  LATICACIDVEN 1.9  --     Recent Results (from the past 240 hour(s))  Resp Panel by RT-PCR (Flu A&B, Covid) Nasopharyngeal Swab     Status: None   Collection Time: 07/15/21  2:46 PM   Specimen: Nasopharyngeal Swab; Nasopharyngeal(NP) swabs in vial transport medium  Result Value Ref Range Status   SARS Coronavirus 2 by RT PCR NEGATIVE NEGATIVE Final    Comment: (NOTE) SARS-CoV-2 target nucleic acids are NOT DETECTED.  The SARS-CoV-2 RNA is generally detectable in upper respiratory specimens during the acute phase of infection. The lowest concentration of SARS-CoV-2 viral copies this  assay can detect is 138 copies/mL. A negative result does not preclude SARS-Cov-2 infection and should not be used as the sole basis for treatment or other patient management decisions. A negative result may occur with  improper specimen collection/handling, submission of specimen other than nasopharyngeal swab, presence of viral mutation(s) within the areas targeted by this assay, and inadequate number of viral copies(<138 copies/mL). A negative result must be combined with clinical observations, patient history, and epidemiological information. The expected result is Negative.  Fact Sheet for Patients:  EntrepreneurPulse.com.au  Fact Sheet for Healthcare Providers:  IncredibleEmployment.be  This test is no t yet  approved or cleared by the [place] and  has been authorized for detection and/or diagnosis of SARS-CoV-2 by FDA under an Emergency Use Authorization (EUA). This EUA will remain  in effect (meaning this test can be used) for the duration of the COVID-19 declaration under Section 564(b)(1) of the Act, 21 U.S.C.section 360bbb-3(b)(1), unless the authorization is terminated  or revoked sooner.       Influenza A by PCR NEGATIVE NEGATIVE Final   Influenza B by PCR NEGATIVE NEGATIVE Final    Comment: (NOTE) The Xpert Xpress SARS-CoV-2/FLU/RSV plus assay is intended as an aid in the diagnosis of influenza from Nasopharyngeal swab specimens and should not be used as a sole basis for treatment. Nasal washings and aspirates are unacceptable for Xpert Xpress SARS-CoV-2/FLU/RSV testing.  Fact Sheet for Patients: EntrepreneurPulse.com.au  Fact Sheet for Healthcare Providers: IncredibleEmployment.be  This test is not yet approved or cleared by the [place] FDA and has been authorized for detection and/or diagnosis of SARS-CoV-2 by FDA under an Emergency Use Authorization (EUA). This EUA will  remain in effect (meaning this test can be used) for the duration of the COVID-19 declaration under Section 564(b)(1) of the Act, 21 U.S.C. section 360bbb-3(b)(1), unless the authorization is terminated or revoked.  Performed at [facility], [address] [place], [place] [address]   MRSA Next Gen by PCR, Nasal     Status: Abnormal   Collection Time: 07/16/21 12:25 PM   Specimen: Nasal Mucosa; Nasal Swab  Result Value Ref Range Status   MRSA by PCR Next Gen DETECTED (A) NOT DETECTED Final    Comment: RESULT CALLED TO, READ BACK BY AND VERIFIED WITH: Burnetta Sabin 07/16/21 1415 AMK (NOTE) The GeneXpert MRSA Assay (FDA approved for NASAL specimens only), is one component of a comprehensive MRSA colonization surveillance program. It is not intended to diagnose MRSA infection nor to guide or monitor treatment for MRSA infections. Test performance is not FDA approved in patients less than 24 years old. Performed at [facility], [address], [place], [place] [address]          Radiology Studies: [facility] Chest [place] 1 View  Result Date: 07/15/2021 CLINICAL DATA:  Abdominal pain.  Nausea and vomiting. EXAM: PORTABLE CHEST 1 VIEW COMPARISON:  Radiograph earlier today. Lung bases from abdominopelvic CT earlier today. FINDINGS: No definite radiographic correlate for reticulonodular opacities in the lower right lung base on CT, there is no focal airspace disease by radiograph. Stable heart size and mediastinal contours, post median sternotomy and aortic valve replacement. Mild vascular congestion. No pleural effusion or pneumothorax. IMPRESSION: 1. Mild vascular congestion. 2. No radiographic correlate for reticulonodular opacities in the lower right lung on abdominal CT earlier this day. Electronically Signed   By: Keith Rake M.D.   On: 07/15/2021 23:48   DG Abd Portable 1V  Result Date: 07/15/2021 CLINICAL DATA:  Abdominal pain.  Nausea and vomiting. EXAM:  PORTABLE ABDOMEN - 1 VIEW COMPARISON:  Abdominopelvic CT earlier today. FINDINGS: Divided supine view of the abdomen obtained. Featureless loop of air-filled bowel in the central pelvis not seen on CT earlier today, 4.1 cm. Moderate stool in the right colon. No definite radiographic correlate for the rectosigmoid wall thickening on CT. There is excreted contrast in the urinary bladder. Advanced aortic and branch atherosclerosis. Cholecystectomy clips in the right upper quadrant. IMPRESSION: Featureless loop of air-filled bowel in the central pelvis not seen on CT earlier today. This may represent new small bowel  dilatation or gaseous distension of inflamed sigmoid colon given rectosigmoid wall thickening on earlier CT. Electronically Signed   By: Keith Rake M.D.   On: 07/15/2021 23:50        Scheduled Meds:  (feeding supplement) PROSource Plus  30 mL Oral BID BM   atorvastatin  80 mg Oral QHS   Chlorhexidine Gluconate Cloth  6 each Topical Daily   DULoxetine  30 mg Oral BID   feeding supplement  1 Container Oral TID BM   [START ON 07/18/2021] influenza vaccine adjuvanted  0.5 mL Intramuscular Tomorrow-1000   insulin aspart  0-5 Units Subcutaneous QHS   insulin aspart  0-9 Units Subcutaneous TID WC   lamoTRIgine  25 mg Oral BID   melatonin  2.5 mg Oral QHS   midodrine  10 mg Oral TID WC   mometasone-formoterol  2 puff Inhalation BID   montelukast  10 mg Oral QHS   multivitamin with minerals  1 tablet Oral Daily   mupirocin ointment   Nasal BID   vitamin B-12  1,000 mcg Oral Daily   Continuous Infusions:  pantoprazole 8 mg/hr (07/17/21 1435)     LOS: 2 days    Time spent: 31 mins     Wyvonnia Dusky, MD [facility] Pager [phone]  If 7PM-7AM, please contact night-coverage  07/17/2021, 3:40 PM

## 2021-07-17 NOTE — Progress Notes (Signed)
Shawn Darby, MD [address]  [place]  [place], [place] [address]  Main: [phone]  Fax: [phone] Pager: [phone]   Subjective: No acute events overnight.  Patient denies episodes of melena or coffee-ground emesis.  He is off pressors.  He denies any abdominal pain.  He is tolerating liquids well   Objective: Vital signs in last 24 hours: Vitals:   07/17/21 0800 07/17/21 0900 07/17/21 1000 07/17/21 1100  BP: (!) 116/54 108/61 99/75 120/63  Pulse: 64 62 61 (!) 58  Resp: 19 20 18 19   Temp: 97.9 F (36.6 C)   98 F (36.7 C)  TempSrc: Oral   Oral  SpO2: 99% 100% 100% 99%  Weight:       Weight change:   Intake/Output Summary (Last 24 hours) at 07/17/2021 1314 Last data filed at 07/17/2021 0800 Gross per 24 hour  Intake 1059.09 ml  Output 2950 ml  Net -1890.91 ml     Exam: Heart:: Regular rate and rhythm, S1S2 present, or without murmur or extra heart sounds Lungs: normal and clear to auscultation Abdomen: soft, nontender, normal bowel sounds   Lab Results: @LABTEST2 @ Micro Results: Recent Results (from the past 240 hour(s))  Resp Panel by RT-PCR (Flu A&B, Covid) Nasopharyngeal Swab     Status: None   Collection Time: 07/15/21  2:46 PM   Specimen: Nasopharyngeal Swab; Nasopharyngeal(NP) swabs in vial transport medium  Result Value Ref Range Status   SARS Coronavirus 2 by RT PCR NEGATIVE NEGATIVE Final    Comment: (NOTE) SARS-CoV-2 target nucleic acids are NOT DETECTED.  The SARS-CoV-2 RNA is generally detectable in upper respiratory specimens during the acute phase of infection. The lowest concentration of SARS-CoV-2 viral copies this assay can detect is 138 copies/mL. A negative result does not preclude SARS-Cov-2 infection and should not be used as the sole basis for treatment or other patient management decisions. A negative result may occur with  improper specimen collection/handling, submission of specimen other than nasopharyngeal  swab, presence of viral mutation(s) within the areas targeted by this assay, and inadequate number of viral copies(<138 copies/mL). A negative result must be combined with clinical observations, patient history, and epidemiological information. The expected result is Negative.  Fact Sheet for Patients:  EntrepreneurPulse.com.au  Fact Sheet for Healthcare Providers:  IncredibleEmployment.be  This test is no t yet approved or cleared by the [place] FDA and  has been authorized for detection and/or diagnosis of SARS-CoV-2 by FDA under an Emergency Use Authorization (EUA). This EUA will remain  in effect (meaning this test can be used) for the duration of the COVID-19 declaration under Section 564(b)(1) of the Act, 21 U.S.C.section 360bbb-3(b)(1), unless the authorization is terminated  or revoked sooner.       Influenza A by PCR NEGATIVE NEGATIVE Final   Influenza B by PCR NEGATIVE NEGATIVE Final    Comment: (NOTE) The Xpert Xpress SARS-CoV-2/FLU/RSV plus assay is intended as an aid in the diagnosis of influenza from Nasopharyngeal swab specimens and should not be used as a sole basis for treatment. Nasal washings and aspirates are unacceptable for Xpert Xpress SARS-CoV-2/FLU/RSV testing.  Fact Sheet for Patients: EntrepreneurPulse.com.au  Fact Sheet for Healthcare Providers: IncredibleEmployment.be  This test is not yet approved or cleared by the [place] FDA and has been authorized for detection and/or diagnosis of SARS-CoV-2 by FDA under an Emergency Use Authorization (EUA). This EUA will remain in effect (meaning this test can be used) for the duration of  the COVID-19 declaration under Section 564(b)(1) of the Act, 21 U.S.C. section 360bbb-3(b)(1), unless the authorization is terminated or revoked.  Performed at [facility], [address] [place], [place] [address]   MRSA  Next Gen by PCR, Nasal     Status: Abnormal   Collection Time: 07/16/21 12:25 PM   Specimen: Nasal Mucosa; Nasal Swab  Result Value Ref Range Status   MRSA by PCR Next Gen DETECTED (A) NOT DETECTED Final    Comment: RESULT CALLED TO, READ BACK BY AND VERIFIED WITH: Burnetta Sabin 07/16/21 1415 AMK (NOTE) The GeneXpert MRSA Assay (FDA approved for NASAL specimens only), is one component of a comprehensive MRSA colonization surveillance program. It is not intended to diagnose MRSA infection nor to guide or monitor treatment for MRSA infections. Test performance is not FDA approved in patients less than 64 years old. Performed at [facility], [address], [place], [place] [address]    Studies/Results: [facility] Chest Port 1 View  Result Date: 07/15/2021 CLINICAL DATA:  Abdominal pain.  Nausea and vomiting. EXAM: PORTABLE CHEST 1 VIEW COMPARISON:  Radiograph earlier today. Lung bases from abdominopelvic CT earlier today. FINDINGS: No definite radiographic correlate for reticulonodular opacities in the lower right lung base on CT, there is no focal airspace disease by radiograph. Stable heart size and mediastinal contours, post median sternotomy and aortic valve replacement. Mild vascular congestion. No pleural effusion or pneumothorax. IMPRESSION: 1. Mild vascular congestion. 2. No radiographic correlate for reticulonodular opacities in the lower right lung on abdominal CT earlier this day. Electronically Signed   By: Keith Rake M.D.   On: 07/15/2021 23:48   DG Chest Port 1 View  Result Date: 07/15/2021 CLINICAL DATA:  Nausea vomiting for 3 days.  Black emesis. EXAM: PORTABLE CHEST 1 VIEW COMPARISON:  11/07/2020. FINDINGS: Stable changes from prior cardiac surgery. No mediastinal or hilar masses. No evidence of adenopathy. Clear lungs.  No pleural effusion or pneumothorax. Skeletal structures grossly intact. Old healed left clavicle fracture. IMPRESSION: No acute cardiopulmonary  disease. Electronically Signed   By: Lajean Manes M.D.   On: 07/15/2021 14:44   DG Abd Portable 1V  Result Date: 07/15/2021 CLINICAL DATA:  Abdominal pain.  Nausea and vomiting. EXAM: PORTABLE ABDOMEN - 1 VIEW COMPARISON:  Abdominopelvic CT earlier today. FINDINGS: Divided supine view of the abdomen obtained. Featureless loop of air-filled bowel in the central pelvis not seen on CT earlier today, 4.1 cm. Moderate stool in the right colon. No definite radiographic correlate for the rectosigmoid wall thickening on CT. There is excreted contrast in the urinary bladder. Advanced aortic and branch atherosclerosis. Cholecystectomy clips in the right upper quadrant. IMPRESSION: Featureless loop of air-filled bowel in the central pelvis not seen on CT earlier today. This may represent new small bowel dilatation or gaseous distension of inflamed sigmoid colon given rectosigmoid wall thickening on earlier CT. Electronically Signed   By: Keith Rake M.D.   On: 07/15/2021 23:50   Medications: I have reviewed the patient's current medications. Prior to Admission:  Medications Prior to Admission  Medication Sig Dispense Refill Last Dose   acetaminophen (TYLENOL) 500 MG tablet Take 500 mg by mouth every 6 (six) hours as needed.   07/15/2021   albuterol (VENTOLIN HFA) 108 (90 Base) MCG/ACT inhaler Inhale 2 puffs into the lungs every 6 (six) hours as needed for wheezing or shortness of breath.       aspirin EC 81 MG tablet Take 81 mg by mouth daily.  07/15/2021   atorvastatin (LIPITOR) 80 MG tablet Take 80 mg by mouth at bedtime.   07/14/2021   cholecalciferol (VITAMIN D3) 25 MCG (1000 UNIT) tablet Take 1,000 Units by mouth every evening.   07/14/2021   dapagliflozin propanediol (FARXIGA) 5 MG TABS tablet Take 5 mg by mouth daily.   07/15/2021 at 0747   DULoxetine (CYMBALTA) 30 MG capsule Take 30 mg by mouth 2 (two) times daily.   07/15/2021 at 0747   ferrous sulfate 325 (65 FE) MG tablet Take 325 mg by mouth daily.    07/15/2021 at 0747   Fluticasone-Salmeterol (ADVAIR) 250-50 MCG/DOSE AEPB Inhale 1 puff into the lungs 2 (two) times daily.   07/15/2021 at 0747   furosemide (LASIX) 40 MG tablet Take 1 tablet (40 mg total) by mouth daily. Take 1 tablet (80 mg total) once daily in the morning (Patient taking differently: Take 40 mg by mouth daily.) 30 tablet 1 07/15/2021 at 0747   hydrOXYzine (ATARAX/VISTARIL) 25 MG tablet Take 25 mg by mouth every 6 (six) hours as needed (allergy symptoms).      isosorbide mononitrate (IMDUR) 30 MG 24 hr tablet Take 1 tablet (30 mg total) by mouth daily. 30 tablet 0 07/15/2021 at 0747   ketotifen (ZADITOR) 0.025 % ophthalmic solution Place 1 drop into both eyes 2 (two) times daily as needed.    07/15/2021 at 0750   lamoTRIgine (LAMICTAL) 25 MG tablet Take 25 mg by mouth 2 (two) times daily.   07/15/2021 at 0747   loratadine (CLARITIN) 10 MG tablet Take 10 mg by mouth daily.   07/15/2021 at 0747   Melatonin 3 MG TABS Take 1 tablet by mouth at bedtime.   07/14/2021   montelukast (SINGULAIR) 10 MG tablet Take 10 mg by mouth at bedtime.   07/14/2021   Multiple Vitamins-Minerals (PRESERVISION AREDS) CAPS Take 1 capsule by mouth in the morning and at bedtime.   07/15/2021 at 0747   nitroGLYCERIN (NITROSTAT) 0.4 MG SL tablet Place 0.4 mg under the tongue every 5 (five) minutes as needed for chest pain.      pantoprazole (PROTONIX) 40 MG tablet Take 40 mg by mouth daily.   07/15/2021   potassium chloride SA (KLOR-CON) 20 MEQ tablet Take 1 tablet (20 mEq total) by mouth daily. 90 tablet 1 07/15/2021 at 0747   Rivaroxaban (XARELTO) 15 MG TABS tablet Take 15 mg by mouth daily.   07/15/2021 at 0747   sacubitril-valsartan (ENTRESTO) 24-26 MG Take 1 tablet by mouth 2 (two) times daily. 60 tablet 6 07/15/2021   senna (SENOKOT) 8.6 MG tablet Take 2 tablets by mouth at bedtime.   07/14/2021   tamsulosin (FLOMAX) 0.4 MG CAPS capsule Take 0.4 mg by mouth daily.   07/15/2021 at 0747   triamcinolone cream (KENALOG)  0.1 % Apply 1 application topically 2 (two) times daily as needed.      vitamin B-12 (CYANOCOBALAMIN) 1000 MCG tablet Take 1,000 mcg by mouth daily.   07/14/2021   albuterol (PROVENTIL) (2.5 MG/3ML) 0.083% nebulizer solution Take 2.5 mg by nebulization every 4 (four) hours as needed for wheezing or shortness of breath. (Patient not taking: Reported on 07/15/2021)   Not Taking   carvedilol (COREG) 3.125 MG tablet Take 1 tablet (3.125 mg total) by mouth 2 (two) times daily. (Patient not taking: Reported on 07/15/2021) 60 tablet 1 Not Taking   lactulose (CHRONULAC) 10 GM/15ML solution Take 30 g by mouth every 12 (twelve) hours as needed for mild constipation. (Patient not  taking: Reported on 07/15/2021)   Not Taking   lidocaine (LIDODERM) 5 % Cut 1 patch in half and apply to amputated stump once daily 12 hours on, 12 hours off (Patient not taking: Reported on 07/15/2021)   Not Taking   liver oil-zinc oxide (DESITIN) 40 % ointment Apply 1 application topically at bedtime as needed for irritation. To buttocks (Patient not taking: Reported on 07/15/2021)   Not Taking   loperamide (IMODIUM) 2 MG capsule Take by mouth. (Patient not taking: No sig reported)   Not Taking   LORazepam (ATIVAN) 0.5 MG tablet Take 0.5 mg by mouth 2 (two) times daily as needed for anxiety. (Patient not taking: Reported on 07/15/2021)   Not Taking   metFORMIN (GLUCOPHAGE) 1000 MG tablet Take 1,000 mg by mouth daily with breakfast. (Patient not taking: No sig reported)   Not Taking   predniSONE (DELTASONE) 20 MG tablet 3 tablets daily x 4 days (Patient not taking: No sig reported) 12 tablet 0 Not Taking   sodium bicarbonate 650 MG tablet Take 1 tablet (650 mg total) by mouth 2 (two) times daily. (Patient not taking: No sig reported) 60 tablet 0 Not Taking   traZODone (DESYREL) 50 MG tablet Take 25 mg by mouth at bedtime as needed for sleep. (Patient not taking: No sig reported)   Not Taking   Scheduled:  (feeding supplement) PROSource Plus   30 mL Oral BID BM   atorvastatin  80 mg Oral QHS   Chlorhexidine Gluconate Cloth  6 each Topical Daily   DULoxetine  30 mg Oral BID   feeding supplement  1 Container Oral TID BM   [START ON 07/18/2021] influenza vaccine adjuvanted  0.5 mL Intramuscular Tomorrow-1000   insulin aspart  0-5 Units Subcutaneous QHS   insulin aspart  0-9 Units Subcutaneous TID WC   lamoTRIgine  25 mg Oral BID   melatonin  2.5 mg Oral QHS   midodrine  10 mg Oral TID WC   mometasone-formoterol  2 puff Inhalation BID   montelukast  10 mg Oral QHS   multivitamin with minerals  1 tablet Oral Daily   mupirocin ointment   Nasal BID   vitamin B-12  1,000 mcg Oral Daily   Continuous:  pantoprazole 8 mg/hr (07/17/21 0800)   DPO:[id number] **OR** acetaminophen, albuterol, LORazepam, morphine injection, nicotine, nitroGLYCERIN, ondansetron **OR** ondansetron (ZOFRAN) IV, oxyCODONE-acetaminophen Anti-infectives (From admission, onward)    Start     Dose/Rate Route Frequency Ordered Stop   07/15/21 1300  cefTRIAXone (ROCEPHIN) 1 g in sodium chloride 0.9 % 100 mL IVPB        1 g 200 mL/hr over 30 Minutes Intravenous  Once 07/15/21 1254 07/15/21 1652   07/15/21 1300  metroNIDAZOLE (FLAGYL) IVPB 500 mg        500 mg 100 mL/hr over 60 Minutes Intravenous  Once 07/15/21 1254 07/15/21 1754      Scheduled Meds:  (feeding supplement) PROSource Plus  30 mL Oral BID BM   atorvastatin  80 mg Oral QHS   Chlorhexidine Gluconate Cloth  6 each Topical Daily   DULoxetine  30 mg Oral BID   feeding supplement  1 Container Oral TID BM   [START ON 07/18/2021] influenza vaccine adjuvanted  0.5 mL Intramuscular Tomorrow-1000   insulin aspart  0-5 Units Subcutaneous QHS   insulin aspart  0-9 Units Subcutaneous TID WC   lamoTRIgine  25 mg Oral BID   melatonin  2.5 mg Oral QHS   midodrine  10  mg Oral TID WC   mometasone-formoterol  2 puff Inhalation BID   montelukast  10 mg Oral QHS   multivitamin with minerals  1 tablet Oral  Daily   mupirocin ointment   Nasal BID   vitamin B-12  1,000 mcg Oral Daily   Continuous Infusions:  pantoprazole 8 mg/hr (07/17/21 0800)   PRN Meds:.acetaminophen **OR** acetaminophen, albuterol, LORazepam, morphine injection, nicotine, nitroGLYCERIN, ondansetron **OR** ondansetron (ZOFRAN) IV, oxyCODONE-acetaminophen   Assessment: Principal Problem:   GI bleed Active Problems:   Chronic kidney disease, stage 3a ([place])   Essential hypertension   Coronary artery disease of native artery of native heart with stable angina pectoris (HCC)   Protein-calorie malnutrition, severe   AF (paroxysmal atrial fibrillation) (HCC)   On anticoagulant therapy   Depression   GERD (gastroesophageal reflux disease)   Tobacco abuse   Hyperlipidemia   Abdominal pain   Plan: Coffee-ground emesis, melena: No active bleeding at this time Patient is currently off pressors Continue pantoprazole drip Monitor CBC closely Okay with full liquid diet N.p.o. effective 5 AM tomorrow Plan for upper endoscopy tomorrow Xarelto has been held for last 48 hours   LOS: 2 days   Jessica Seidman 07/17/2021, 1:14 PM

## 2021-07-17 NOTE — Evaluation (Addendum)
Physical Therapy Evaluation Patient Details Name: Shawn Jennings MRN: [id number] DOB: 24-Jan-1946 Today's Date: 07/17/2021  History of Present Illness  75 y.o. male with medical history significant for cad, history of cabg in 40 in [place], [place], gerd, depression, anxiety, ischemic cardiomyopathy, afib (paroxysmal), ckd3a/b, hld, tobacco abuse, debility, aortic stenosis s/p avr, copd, htn, pad s/p left aka in 2016. Pt admitted for hypotension and possible GI bleed.  Clinical Impression  Pt alert, cooperative, and pleasant throughout treatment session. Pt denies pain throughout activities. Pt states PLOF is MOD I for transfers without AD w/ lateral scooting. 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 ALF provides assistance for ADLs such as bathing and meals.   Pt required SUPV for bed mobility for cues to manage trunk, originally reaching for assist but able to fully manage BLE & trunk w/cues. Transferred bed > recliner w/ SUPV for task setup with lateral scoot. Pt demonstrates good stability with transfer w/ no LOB noted and good BUE and RLE strength. Discharge recommendations are HHPT to return to PLOF and maximize functional mobility. Skilled PT intervention is indicated to address deficits in function, mobility, and to return to PLOF as able.       Recommendations for follow up therapy are one component of a multi-disciplinary discharge planning process, led by the attending physician.  Recommendations may be updated based on patient status, additional functional criteria and insurance authorization.  Follow Up Recommendations Home health PT    Equipment Recommendations  None recommended by PT    Recommendations for Other Services       Precautions / Restrictions Precautions Precautions: Fall Restrictions Weight Bearing Restrictions: No      Mobility  Bed Mobility Overal bed mobility: Needs Assistance Bed Mobility: Supine to Sit     Supine to sit: HOB  elevated;Supervision     General bed mobility comments: Pt iniitally reached out for support, but able to come to EOB w/ supervision for cues in bringing trunk upright    Transfers Overall transfer level: Needs assistance Equipment used: None Transfers: Lateral/Scoot Transfers          Lateral/Scoot Transfers: Supervision General transfer comment: Supervision for environment set-up and cues for bed > recliner transfer. Pt able to lift bottom from chair w/ BUE and R LE for linen adjustment  Ambulation/Gait             General Gait Details: Not assessed  Stairs            Wheelchair Mobility    Modified Rankin (Stroke Patients Only)       Balance Overall balance assessment: Needs assistance Sitting-balance support: Feet unsupported;Bilateral upper extremity supported Sitting balance-Leahy Scale: Good Sitting balance - Comments: No lateral or posterior lean, able to lift BUE without LOB, feet unsupporated       Standing balance comment: Full upright stnading not assessed this session, good balance w/ lateral  scooting                             Pertinent Vitals/Pain Pain Assessment: No/denies pain    Home Living Family/patient expects to be discharged to:: Assisted living               Home Equipment: Wheelchair - [employer] - 2 wheels      Prior Function Level of Independence: Needs assistance   Gait / Transfers Assistance Needed: Pt MOD I for transfers w/ lateral scoot  without AD;  Pt utilizes WC for household distances without assist.  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, Hip Abd/Add)    Cervical / Trunk Assessment Cervical  / Trunk Assessment: Normal  Communication   Communication: No difficulties  Cognition Arousal/Alertness: Awake/alert Behavior During Therapy: WFL for tasks assessed/performed Overall Cognitive Status: Within Functional Limits for tasks assessed                                 General Comments: AOx4      General Comments      Exercises     Assessment/Plan    PT Assessment Patient needs continued PT services  PT Problem List Decreased strength;Decreased range of motion;Decreased activity tolerance;Decreased balance;Decreased mobility       PT Treatment Interventions Gait training;Functional mobility training;Therapeutic activities;Therapeutic exercise;Balance training;Neuromuscular re-education    PT Goals (Current goals can be found in the Care Plan section)  Acute Rehab PT Goals Patient Stated Goal: To go home PT Goal Formulation: With patient Time For Goal Achievement: 07/31/21 Potential to Achieve Goals: Good    Frequency Min 2X/week   Barriers to discharge        Co-evaluation               AM-PAC PT "6 Clicks" Mobility  Outcome Measure Help needed turning from your back to your side while in a flat bed without using bedrails?: None Help needed moving from lying on your back to sitting on the side of a flat bed without using bedrails?: None Help needed moving to and from a bed to a chair (including a wheelchair)?: None Help needed standing up from a chair using your arms (e.g., wheelchair or bedside chair)?: A Little Help needed to walk in hospital room?: A Lot Help needed climbing 3-5 steps with a railing? : A Lot 6 Click Score: 19    End of Session Equipment Utilized During Treatment: Gait belt;Oxygen Activity Tolerance: Patient tolerated treatment well Patient left: in chair;with SCD's reapplied;with call bell/phone within reach;with nursing/sitter in room Nurse Communication: Mobility status PT Visit Diagnosis: Muscle weakness  (generalized) (M62.81)    Time: 3419-6222 PT Time Calculation (min) (ACUTE ONLY): 21 min   Charges:             [employer], SPT

## 2021-07-17 NOTE — Evaluation (Signed)
Occupational Therapy Evaluation Patient Details Name: Shawn Jennings MRN: [id number] DOB: [birth year] Today's Date: 07/17/2021   History of Present Illness 75 y.o. male with medical history significant for cad, history of cabg in 25 in [place], [place], gerd, depression, anxiety, ischemic cardiomyopathy, afib (paroxysmal), ckd3a/b, hld, tobacco abuse, debility, aortic stenosis s/p avr, copd, htn, pad s/p left aka in 2016. Pt admitted for hypotension and possible GI bleed.   Clinical Impression   Patient presenting with decreased I in self care, balance, functional mobility/transfers,endurance, and safety awareness, Patient reports living in an ALF at baseline. Pt stands with RW and transfers into wheelchair and propels himself. Staff assist with shower in roll in chair and pt reports he dresses himself and performs toileting independently. Pt performing bed mobility without assistance but once R LE exits over EOB he crys out and reports 8/10 pain on lateral aspect of knee. RN notified and pt requesting medication. Pt reports no recent falls.   Patient will benefit from acute OT to increase overall independence in the areas of ADLs, functional mobility, and safety awareness in order to safely discharge to next venue of care.      Recommendations for follow up therapy are one component of a multi-disciplinary discharge planning process, led by the attending physician.  Recommendations may be updated based on patient status, additional functional criteria and insurance authorization.   Follow Up Recommendations  Home health OT    Equipment Recommendations  None recommended by OT       Precautions / Restrictions Precautions Precautions: Fall      Mobility Bed Mobility Overal bed mobility: Needs Assistance             General bed mobility comments: Pt moving self towards EOB but then quickly returns R LE back onto bed secondary to increased R knee pain.    Transfers                  General transfer comment: Pt declined secondary to pain        ADL either performed or assessed with clinical judgement   ADL Overall ADL's : Needs assistance/impaired                                       General ADL Comments: UB self care and grooming with set up A. Pt likely able to perform all of LB self care from bed level without assist as well. Attempted bed mobility and functional transfers but limitied secondary to R knee pain. RN notified.     Vision Patient Visual Report: No change from baseline              Pertinent Vitals/Pain Pain Assessment: No/denies pain     Hand Dominance Right   Extremity/Trunk Assessment Upper Extremity Assessment Upper Extremity Assessment: Overall WFL for tasks assessed   Lower Extremity Assessment Lower Extremity Assessment: Defer to PT evaluation       Communication Communication Communication: No difficulties   Cognition Arousal/Alertness: Awake/alert Behavior During Therapy: WFL for tasks assessed/performed Overall Cognitive Status: Within Functional Limits for tasks assessed                                 General Comments: Pt is pleasant and agreeable throughout.              Home Living  Family/patient expects to be discharged to:: Assisted living                             Home Equipment: Wheelchair - manual;Walker - 2 wheels          Prior Functioning/Environment Level of Independence: Needs assistance  Gait / Transfers Assistance Needed: Pt reports not wearing L LE prosthesis in over 1 year. He stands and pivots to w/c with RW and propels chair with B UEs and R LE. ADL's / Homemaking Assistance Needed: Staff use roll in shower chair to assist him with bathing and he reports dressing himself. He also reports toileting independently by transfering from wheelchair <>toilet.            OT Problem List: Pain;Decreased safety awareness;Impaired balance (sitting  and/or standing);Decreased activity tolerance      OT Treatment/Interventions: Self-care/ADL training;Therapeutic exercise;Patient/family education;Modalities;Manual therapy;Balance training;Energy conservation;Therapeutic activities;DME and/or AE instruction    OT Goals(Current goals can be found in the care plan section) Acute Rehab OT Goals Patient Stated Goal: to go home OT Goal Formulation: With patient Time For Goal Achievement: 07/31/21 Potential to Achieve Goals: Fair ADL Goals Pt Will Perform Lower Body Dressing: with supervision;sit to/from stand Pt Will Transfer to Toilet: with supervision Pt Will Perform Toileting - Clothing Manipulation and hygiene: with supervision  OT Frequency: Min 2X/week   Barriers to D/C:    none known at this time          AM-PAC OT "6 Clicks" Daily Activity     Outcome Measure Help from another person eating meals?: None Help from another person taking care of personal grooming?: None Help from another person toileting, which includes using toliet, bedpan, or urinal?: A Little Help from another person bathing (including washing, rinsing, drying)?: A Little Help from another person to put on and taking off regular upper body clothing?: None Help from another person to put on and taking off regular lower body clothing?: A Little 6 Click Score: 21   End of Session Nurse Communication: Patient requests pain meds  Activity Tolerance: Patient limited by pain Patient left: in bed;with call bell/phone within reach;with bed alarm set  OT Visit Diagnosis: Unsteadiness on feet (R26.81);Muscle weakness (generalized) (M62.81)                Time: 1040-1103 OT Time Calculation (min): 23 min Charges:  OT General Charges $OT Visit: 1 Visit OT Evaluation $OT Eval Low Complexity: 1 Low OT Treatments $Therapeutic Activity: 8-22 mins  Darleen Crocker, MS, OTR/L , CBIS ascom [phone]  07/17/21, 12:45 PM

## 2021-07-17 NOTE — Progress Notes (Signed)
Initial Nutrition Assessment  DOCUMENTATION CODES:  Not applicable  INTERVENTION:  Continue to advance diet as tolerated and as medically able.  Add Boost Breeze po TID, each supplement provides 250 kcal and 9 grams of protein.  Add 30 ml ProSource Plus po BID, each supplement provides 100 kcal and 15 grams of protein.    Add MVI with minerals daily.  NUTRITION DIAGNOSIS:  Inadequate oral intake related to nausea, vomiting as evidenced by per patient/family report.  GOAL:  Patient will meet greater than or equal to 90% of their needs  MONITOR:  PO intake, Supplement acceptance, Diet advancement, Labs, Weight trends, I & O's  REASON FOR ASSESSMENT:  Malnutrition Screening Tool    ASSESSMENT:  75 yo male with a PMH of CAD, history of CABG 1994, GERD, depression, anxiety, ischemic cardiomyopathy, afib (paroxysmal), CKD stage 3a/b, HLD, tobacco abuse, debility, aortic stenosis s/p avr, COPD, HTN, and PAD s/p L AKA in 2016 who presents with a GI bleed.  RD working remotely. Attempted to call patient's room phone. Pt did not answer.  Pt complains of abdominal pain per MD report.   Pt consuming 50-100% of last 4 clear liquid meals.  Per Epic, pt's weight appears to be up from previous admission in April 2022. No edema noted in RN report.  RD suspects patient is malnourished to some degree, however cannot definitively diagnose at this time.  Recommend adding Boost Breeze TID, ProSource Plus BID, and MVI with minerals.  Medications: reviewed; SSI, midodrine TID, Vitamin B12, Protonix per IV, Percocet PO PRN (given once today)  Labs: reviewed; CBG 114-182 (H)  NUTRITION - FOCUSED PHYSICAL EXAM: Unable to perform - defer to in-person assessment  Diet Order:   Diet Order             Diet clear liquid Room service appropriate? Yes; Fluid consistency: Thin  Diet effective now                  EDUCATION NEEDS:  No education needs have been identified at this  time  Skin:  Skin Assessment: Reviewed RN Assessment  Last BM:  07/16/21  Height:  Ht Readings from Last 1 Encounters:  02/16/21 6' (1.829 m)   Weight:  Wt Readings from Last 1 Encounters:  07/15/21 67 kg   BMI:  Body mass index is 20.03 kg/m.  Estimated Nutritional Needs:  Kcal:  1900-2100 Protein:  85-100 grams Fluid:  >1.9 L  Derrel Nip, RD, LDN (she/her/hers) Registered Dietitian I After-Hours/Weekend Pager # in [place]

## 2021-07-18 ENCOUNTER — Inpatient Hospital Stay: Payer: Medicare Other | Admitting: Anesthesiology

## 2021-07-18 ENCOUNTER — Encounter: Payer: Self-pay | Admitting: Internal Medicine

## 2021-07-18 ENCOUNTER — Encounter
Admission: EM | Disposition: A | Discharge status: Nursing Home (Includes ICF) | Payer: Self-pay | Source: Skilled Nursing Facility | Attending: Internal Medicine

## 2021-07-18 ENCOUNTER — Other Ambulatory Visit: Payer: Self-pay

## 2021-07-18 DIAGNOSIS — D62 Acute posthemorrhagic anemia: Secondary | ICD-10-CM | POA: Diagnosis not present

## 2021-07-18 DIAGNOSIS — N1831 Chronic kidney disease, stage 3a: Secondary | ICD-10-CM | POA: Diagnosis not present

## 2021-07-18 DIAGNOSIS — K921 Melena: Secondary | ICD-10-CM | POA: Diagnosis not present

## 2021-07-18 DIAGNOSIS — K922 Gastrointestinal hemorrhage, unspecified: Secondary | ICD-10-CM | POA: Diagnosis not present

## 2021-07-18 DIAGNOSIS — K920 Hematemesis: Secondary | ICD-10-CM

## 2021-07-18 HISTORY — PX: ESOPHAGOGASTRODUODENOSCOPY (EGD) WITH PROPOFOL: SHX5813

## 2021-07-18 LAB — BASIC METABOLIC PANEL
Anion gap: 9 (ref 5–15)
BUN: 20 mg/dL (ref 8–23)
CO2: 21 mmol/L — ABNORMAL LOW (ref 22–32)
Calcium: 8.1 mg/dL — ABNORMAL LOW (ref 8.9–10.3)
Chloride: 112 mmol/L — ABNORMAL HIGH (ref 98–111)
Creatinine, Ser: 1.35 mg/dL — ABNORMAL HIGH (ref 0.61–1.24)
GFR, Estimated: 54 mL/min — ABNORMAL LOW (ref >60)
Glucose, Bld: 151 mg/dL — ABNORMAL HIGH (ref 70–99)
Potassium: 3.3 mmol/L — ABNORMAL LOW (ref 3.5–5.1)
Sodium: 142 mmol/L (ref 135–145)

## 2021-07-18 LAB — GLUCOSE, CAPILLARY
Glucose-Capillary: 105 mg/dL — ABNORMAL HIGH (ref 70–99)
Glucose-Capillary: 153 mg/dL — ABNORMAL HIGH (ref 70–99)
Glucose-Capillary: 116 mg/dL — ABNORMAL HIGH (ref 70–99)
Glucose-Capillary: 181 mg/dL — ABNORMAL HIGH (ref 70–99)
Glucose-Capillary: 114 mg/dL — ABNORMAL HIGH (ref 70–99)

## 2021-07-18 LAB — CBC
HCT: 28.2 % — ABNORMAL LOW (ref 39.0–52.0)
Hemoglobin: 9.2 g/dL — ABNORMAL LOW (ref 13.0–17.0)
MCH: 29.6 pg (ref 26.0–34.0)
MCHC: 32.6 g/dL (ref 30.0–36.0)
MCV: 90.7 fL (ref 80.0–100.0)
Platelets: 163 10*3/uL (ref 150–400)
RBC: 3.11 MIL/uL — ABNORMAL LOW (ref 4.22–5.81)
RDW: 16.5 % — ABNORMAL HIGH (ref 11.5–15.5)
WBC: 14.8 10*3/uL — ABNORMAL HIGH (ref 4.0–10.5)
nRBC: 0 % (ref 0.0–0.2)

## 2021-07-18 SURGERY — ESOPHAGOGASTRODUODENOSCOPY (EGD) WITH PROPOFOL
Anesthesia: General

## 2021-07-18 MED ORDER — EPHEDRINE 5 MG/ML INJ
INTRAVENOUS | Status: AC
  Filled 2021-07-18: qty 5

## 2021-07-18 MED ORDER — LIDOCAINE HCL (CARDIAC) PF 100 MG/5ML IV SOSY
PREFILLED_SYRINGE | INTRAVENOUS | Status: DC | PRN
  Administered 2021-07-18: 50 mg via INTRAVENOUS

## 2021-07-18 MED ORDER — SODIUM CHLORIDE 0.9 % IV SOLN: INTRAVENOUS | Status: DC

## 2021-07-18 MED ORDER — PEG 3350-KCL-NA BICARB-NACL 420 G PO SOLR
4000.0000 mL | Freq: Once | ORAL | Status: DC
  Administered 2021-07-18: 4000 mL via ORAL
  Filled 2021-07-18: qty 4000

## 2021-07-18 MED ORDER — PROPOFOL 500 MG/50ML IV EMUL
INTRAVENOUS | Status: DC | PRN
  Administered 2021-07-18: 120 ug/kg/min via INTRAVENOUS

## 2021-07-18 MED ORDER — EPHEDRINE SULFATE 50 MG/ML IJ SOLN
INTRAMUSCULAR | Status: DC | PRN
  Administered 2021-07-18 (×2): 10 mg via INTRAVENOUS

## 2021-07-18 MED ORDER — POTASSIUM CHLORIDE CRYS ER 20 MEQ PO TBCR
20.0000 meq | EXTENDED_RELEASE_TABLET | Freq: Once | ORAL | Status: AC
  Administered 2021-07-18: 20 meq via ORAL
  Filled 2021-07-18: qty 1

## 2021-07-18 MED ORDER — PROPOFOL 500 MG/50ML IV EMUL
INTRAVENOUS | Status: AC
  Filled 2021-07-18: qty 50

## 2021-07-18 MED ORDER — PHENYLEPHRINE HCL (PRESSORS) 10 MG/ML IV SOLN
INTRAVENOUS | Status: AC
  Filled 2021-07-18: qty 1

## 2021-07-18 MED ORDER — PHENYLEPHRINE HCL (PRESSORS) 10 MG/ML IV SOLN
INTRAVENOUS | Status: DC | PRN
Start: 2021-07-18 — End: 2021-07-18
  Administered 2021-07-18 (×2): 100 ug via INTRAVENOUS

## 2021-07-18 MED ORDER — POLYETHYLENE GLYCOL 3350 17 GM/SCOOP PO POWD
1.0000 | Freq: Once | ORAL | Status: AC
  Administered 2021-07-18: 255 g via ORAL
  Filled 2021-07-18: qty 255

## 2021-07-18 MED ORDER — LIDOCAINE HCL (PF) 2 % IJ SOLN
INTRAMUSCULAR | Status: AC
  Filled 2021-07-18: qty 5

## 2021-07-18 NOTE — Transfer of Care (Signed)
Immediate Anesthesia Transfer of Care Note  Patient: Shawn Jennings  Procedure(s) Performed: ESOPHAGOGASTRODUODENOSCOPY (EGD) WITH PROPOFOL  Patient Location: PACU  Anesthesia Type:General  Level of Consciousness: awake and sedated  Airway & Oxygen Therapy: Patient Spontanous Breathing and Patient connected to nasal cannula oxygen  Post-op Assessment: Report given to RN and Post -op Vital signs reviewed and stable  Post vital signs: Reviewed and stable  Last Vitals:  Vitals Value Taken Time  BP    Temp    Pulse    Resp    SpO2      Last Pain:  Vitals:   07/18/21 1101  TempSrc: Temporal  PainSc:       Patients Stated Pain Goal: 0 (70/35/00 9381)  Complications: No notable events documented.

## 2021-07-18 NOTE — Op Note (Signed)
[facility] Gastroenterology Patient Name: Shawn Jennings Procedure Date: 07/18/2021 11:20 AM MRN: [id number] Account #: [id number] Date of Birth: 1945-11-01 Admit Type: Outpatient Age: 75 Room: [facility] ENDO ROOM 3 Gender: Male Note Status: Finalized Instrument Name: Upper Endoscope [id number] Procedure:             Upper GI endoscopy Indications:           Acute post hemorrhagic anemia, Coffee-ground emesis,                         Melena Providers:             Lin Landsman MD, MD Medicines:             General Anesthesia Complications:         No immediate complications. Estimated blood loss: None. Procedure:             Pre-Anesthesia Assessment:                        - Prior to the procedure, a History and Physical was                         performed, and patient medications and allergies were                         reviewed. The patient is competent. The risks and                         benefits of the procedure and the sedation options and                         risks were discussed with the patient. All questions                         were answered and informed consent was obtained.                         Patient identification and proposed procedure were                         verified by the physician, the nurse, the                         anesthesiologist, the anesthetist and the technician                         in the pre-procedure area in the procedure room in the                         endoscopy suite. Mental Status Examination: alert and                         oriented. Airway Examination: normal oropharyngeal                         airway and neck mobility. Respiratory Examination:                         clear to auscultation. CV  Examination: normal.                         Prophylactic Antibiotics: The patient does not require                         prophylactic antibiotics. Prior Anticoagulants: The                          patient has taken Xarelto (rivaroxaban), last dose was                         3 days prior to procedure. ASA Grade Assessment: III -                         A patient with severe systemic disease. After                         reviewing the risks and benefits, the patient was                         deemed in satisfactory condition to undergo the                         procedure. The anesthesia plan was to use general                         anesthesia. Immediately prior to administration of                         medications, the patient was re-assessed for adequacy                         to receive sedatives. The heart rate, respiratory                         rate, oxygen saturations, blood pressure, adequacy of                         pulmonary ventilation, and response to care were                         monitored throughout the procedure. The physical                         status of the patient was re-assessed after the                         procedure.                        After obtaining informed consent, the endoscope was                         passed under direct vision. Throughout the procedure,                         the patient's blood pressure, pulse, and oxygen  saturations were monitored continuously. The Endoscope                         was introduced through the mouth, and advanced to the                         second part of duodenum. The upper GI endoscopy was                         accomplished without difficulty. The patient tolerated                         the procedure well. Findings:      The duodenal bulb and second portion of the duodenum were normal.      Multiple dispersed 3 to 6 mm erosions with no bleeding and no stigmata       of recent bleeding were found in the gastric body and in the gastric       antrum. There are areas of healing erosions and ulcer in antrum Biopsies       were taken with a cold forceps for  Helicobacter pylori testing.       Estimated blood loss: none.      The cardia and gastric fundus were normal on retroflexion.      The gastroesophageal junction and examined esophagus were normal.      Esophagogastric landmarks were identified: the gastroesophageal junction       was found at 38 cm from the incisors. Impression:            - Normal duodenal bulb and second portion of the                         duodenum.                        - Erosive gastropathy with no bleeding and no stigmata                         of recent bleeding. Biopsied.                        - Normal gastroesophageal junction and esophagus.                        - Esophagogastric landmarks identified. Recommendation:        - Return patient to hospital ward for ongoing care.                        - Clear liquid diet today.                        - Continue present medications.                        - Recommend protonix 40mg  BID                        - Perform a colonoscopy tomorrow.                        - Await pathology results. Procedure Code(s):     ---  Professional ---                        [phone], Esophagogastroduodenoscopy, flexible,                         transoral; with biopsy, single or multiple Diagnosis Code(s):     --- Professional ---                        K31.89, Other diseases of stomach and duodenum                        D62, Acute posthemorrhagic anemia                        K92.0, Hematemesis                        K92.1, Melena (includes Hematochezia) CPT copyright 2019 [employer]. All rights reserved. The codes documented in this report are preliminary and upon coder review may  be revised to meet current compliance requirements. Dr. Ulyess Mort Lin Landsman MD, MD 07/18/2021 11:46:44 AM This report has been signed electronically. Number of Addenda: 0 Note Initiated On: 07/18/2021 11:20 AM Estimated Blood Loss:  Estimated blood loss: none.       [facility]

## 2021-07-18 NOTE — Progress Notes (Addendum)
PROGRESS NOTE   HPI was taken from Dr. Tobie Poet: Shawn Jennings is a 75 y.o. male with medical history significant for cad, history of cabg in 10 in [place], [place], gerd, depression, anxiety, ischemic cardiomyopathy, afib (paroxysmal), ckd3a/b, hld, tobacco abuse, debility, aortic stenosis s/p avr, copd, htn, pad s/p left aka in 2016,    He endorses Two days of vomiting blood. He reports this is the first time. He stated that he didn't come in earlier "it's because of stupidity, I was hoping it would go away". He endorses black tarry diarrhea for two days, too many to count. He reports he has never had that before. He reports not being on iron tablets.  He denies NSAIDs, BC powder, Goody powder use.   He endorses generalized weakness and fatigue in the last week.  He states that in the emergency department after blood transfusion and fluids, he feels better.   He denies LOC or syncope. He denies chest pain, shortness of breath, swelling in his lower extremity. He denies fever, chills, dysuria, hematuria, cough.   He endorses loss of appetite in the last 2 days.   Social history: He lives in a group home, assisted living. He smokes 6 cigarettes per day, at his peak, 1 ppd. He denies etoh and recreational drug use     Shawn Jennings  PHX:[id number] DOB: 10-Sep-1946 DOA: 07/15/2021 PCP: [facility]  Assessment & Plan:   Principal Problem:   GI bleed Active Problems:   Chronic kidney disease, stage 3a ([place])   Essential hypertension   Coronary artery disease of native artery of native heart with stable angina pectoris ([place])   Protein-calorie malnutrition, severe   AF (paroxysmal atrial fibrillation) (HCC)   On anticoagulant therapy   Depression   GERD (gastroesophageal reflux disease)   Tobacco abuse   Hyperlipidemia   Abdominal pain  GI bleed: etiology unclear. S/p EGD today which showed erosive gastropathy w/ no bleeding which was biopsied. Colonoscopy tomorrow as per GI.   Continue on IV PPI. S/p 2 units of pRBCs given. Will continue to monitor H&H   Hypotension: likely secondary to GI bleed. Continue on midodrine. Keep MAP > 65.  Resolved    Acute blood loss anemia: w/ hx of normocytic anemia. S/p 2 units of pRBCs transfused. Continue to monitor H&H   Hypokalemia: KCl repleated. Will continue to monitor    Leukocytosis: likely reactive. No fevers.    Thrombocytopenia: resolved   Likely CKD: baseline Cr/GFR is unknown, currently on stage IIIa. Cr is trending down from day prior.   Depression: severity unknown. Continue on home dose of cymbalta, lamotrigine   Anxiety: severity unknown. Continue on home dose of lorazepam    DM2: well controlled, HbA1c 6.5. Continue on SSI w/ accuchecks    Hx of CAD: continue on statin. Continue to hold aspirin, coreg, imdur, entresto    HLD: continue on statin    PAF: continue to hold xarelto, coreg    Insomnia: continue on melatonin    Tobacco abuse: nicotine patch to prevent w/drawal. Smoking cessation counseling    PAD: s/p AKA. Continue w/ supportive care     DVT prophylaxis: SCD Code Status: partial  Family Communication:  Disposition Plan:  likely d/c back to home facility   Level of care: Stepdown  Status is: Inpatient  Remains inpatient appropriate because:Unsafe d/c plan, IV treatments appropriate due to intensity of illness or inability to take PO, and Inpatient level of care appropriate due to severity of illness;  can likely be d/c in 48 hrs, colonoscopy tomorrow   Dispo: The patient is from:  home facility              Anticipated d/c is to:  home facility               Patient currently is not medically stable to d/c.   Difficult to place patient : unclear    Consultants:  GI   Procedures:  Antimicrobials:   Subjective: Pt c/o fatigue  Objective: Vitals:   07/18/21 1200 07/18/21 1210 07/18/21 1300 07/18/21 1400  BP: (!) 96/52 (!) 117/57 121/69 (!) 133/94  Pulse:   66 68   Resp: (!) 26 17 (!) 21 (!) 21  Temp:   98 F (36.7 C)   TempSrc:   Oral   SpO2: 96% 96% 96% 92%  Weight:        Intake/Output Summary (Last 24 hours) at 07/18/2021 1543 Last data filed at 07/18/2021 1500 Gross per 24 hour  Intake 737.84 ml  Output 1800 ml  Net -1062.16 ml   Filed Weights   07/15/21 1049  Weight: 67 kg    Examination:  General exam: Appears calm  Respiratory system: clear breath sounds b/l. No wheezes, rales Cardiovascular system: S1 & S2+. No gallops or rubs  Gastrointestinal system: Abd is soft, NT, ND & hypoactive bowel sounds  Central nervous system: Alert & oriented. Moves all 3 extremities & LE stump  Psychiatry: Judgement and insight appear normal. Appropriate mood and affect    Data Reviewed: I have personally reviewed following labs and imaging studies  CBC: Recent Labs  Lab 07/15/21 1046 07/15/21 1510 07/15/21 2237 07/16/21 0555 07/17/21 0903 07/18/21 0847  WBC 15.1* 14.3*  --  12.0* 15.6* 14.8*  NEUTROABS 11.9* 10.9*  --   --   --   --   HGB 7.1* 9.6* 8.2* 9.2* 10.1* 9.2*  HCT 21.6* 28.2* 24.6* 27.0* 30.1* 28.2*  MCV 90.4 88.4  --  90.0 92.0 90.7  PLT 212 184  --  142* 157 027   Basic Metabolic Panel: Recent Labs  Lab 07/15/21 1046 07/16/21 0555 07/17/21 0903 07/18/21 0847  NA 138 142 143 142  K 4.1 3.7 4.1 3.3*  CL 107 114* 115* 112*  CO2 22 19* 19* 21*  GLUCOSE 170* 122* 185* 151*  BUN 48* 35* 26* 20  CREATININE 1.65* 1.48* 1.48* 1.35*  CALCIUM 8.7* 7.7* 8.2* 8.1*  MG  --  1.7  --   --    GFR: Estimated Creatinine Clearance: 43.4 mL/min (A) (by C-G formula based on SCr of 1.35 mg/dL (H)). Liver Function Tests: Recent Labs  Lab 07/15/21 1046 07/16/21 0555  AST 15 10*  ALT 10 8  ALKPHOS 70 52  BILITOT 0.6 0.7  PROT 5.9* 4.4*  ALBUMIN 3.2* 2.4*   Recent Labs  Lab 07/15/21 1048  LIPASE 119*   No results for input(s): AMMONIA in the last 168 hours. Coagulation Profile: Recent Labs  Lab 07/15/21 1046  INR  2.2*   Cardiac Enzymes: No results for input(s): CKTOTAL, CKMB, CKMBINDEX, TROPONINI in the last 168 hours. BNP (last 3 results) No results for input(s): PROBNP in the last 8760 hours. HbA1C: Recent Labs    07/15/21 1839  HGBA1C 6.5*   CBG: Recent Labs  Lab 07/17/21 1210 07/17/21 1552 07/17/21 2109 07/18/21 0738 07/18/21 1055  GLUCAP 140* 102* 105* 153* 116*   Lipid Profile: No results for input(s): CHOL, HDL, LDLCALC, TRIG, CHOLHDL,  LDLDIRECT in the last 72 hours. Thyroid Function Tests: No results for input(s): TSH, T4TOTAL, FREET4, T3FREE, THYROIDAB in the last 72 hours. Anemia Panel: No results for input(s): VITAMINB12, FOLATE, FERRITIN, TIBC, IRON, RETICCTPCT in the last 72 hours. Sepsis Labs: Recent Labs  Lab 07/15/21 1046 07/15/21 1839  PROCALCITON  --  <0.10  LATICACIDVEN 1.9  --     Recent Results (from the past 240 hour(s))  Resp Panel by RT-PCR (Flu A&B, Covid) Nasopharyngeal Swab     Status: None   Collection Time: 07/15/21  2:46 PM   Specimen: Nasopharyngeal Swab; Nasopharyngeal(NP) swabs in vial transport medium  Result Value Ref Range Status   SARS Coronavirus 2 by RT PCR NEGATIVE NEGATIVE Final    Comment: (NOTE) SARS-CoV-2 target nucleic acids are NOT DETECTED.  The SARS-CoV-2 RNA is generally detectable in upper respiratory specimens during the acute phase of infection. The lowest concentration of SARS-CoV-2 viral copies this assay can detect is 138 copies/mL. A negative result does not preclude SARS-Cov-2 infection and should not be used as the sole basis for treatment or other patient management decisions. A negative result may occur with  improper specimen collection/handling, submission of specimen other than nasopharyngeal swab, presence of viral mutation(s) within the areas targeted by this assay, and inadequate number of viral copies(<138 copies/mL). A negative result must be combined with clinical observations, patient history, and  epidemiological information. The expected result is Negative.  Fact Sheet for Patients:  EntrepreneurPulse.com.au  Fact Sheet for Healthcare Providers:  IncredibleEmployment.be  This test is no t yet approved or cleared by the [place] FDA and  has been authorized for detection and/or diagnosis of SARS-CoV-2 by FDA under an Emergency Use Authorization (EUA). This EUA will remain  in effect (meaning this test can be used) for the duration of the COVID-19 declaration under Section 564(b)(1) of the Act, 21 U.S.C.section 360bbb-3(b)(1), unless the authorization is terminated  or revoked sooner.       Influenza A by PCR NEGATIVE NEGATIVE Final   Influenza B by PCR NEGATIVE NEGATIVE Final    Comment: (NOTE) The Xpert Xpress SARS-CoV-2/FLU/RSV plus assay is intended as an aid in the diagnosis of influenza from Nasopharyngeal swab specimens and should not be used as a sole basis for treatment. Nasal washings and aspirates are unacceptable for Xpert Xpress SARS-CoV-2/FLU/RSV testing.  Fact Sheet for Patients: EntrepreneurPulse.com.au  Fact Sheet for Healthcare Providers: IncredibleEmployment.be  This test is not yet approved or cleared by the [place] FDA and has been authorized for detection and/or diagnosis of SARS-CoV-2 by FDA under an Emergency Use Authorization (EUA). This EUA will remain in effect (meaning this test can be used) for the duration of the COVID-19 declaration under Section 564(b)(1) of the Act, 21 U.S.C. section 360bbb-3(b)(1), unless the authorization is terminated or revoked.  Performed at [facility], [address] [place], [place] [address]   MRSA Next Gen by PCR, Nasal     Status: Abnormal   Collection Time: 07/16/21 12:25 PM   Specimen: Nasal Mucosa; Nasal Swab  Result Value Ref Range Status   MRSA by PCR Next Gen DETECTED (A) NOT DETECTED Final    Comment:  RESULT CALLED TO, READ BACK BY AND VERIFIED WITH: Burnetta Sabin 07/16/21 1415 AMK (NOTE) The GeneXpert MRSA Assay (FDA approved for NASAL specimens only), is one component of a comprehensive MRSA colonization surveillance program. It is not intended to diagnose MRSA infection nor to guide or monitor treatment for MRSA infections. Test  performance is not FDA approved in patients less than 72 years old. Performed at [facility], [address], [place], [place] [address]          Radiology Studies: No results found.      Scheduled Meds:  (feeding supplement) PROSource Plus  30 mL Oral BID BM   atorvastatin  80 mg Oral QHS   Chlorhexidine Gluconate Cloth  6 each Topical Daily   DULoxetine  30 mg Oral BID   feeding supplement  1 Container Oral TID BM   influenza vaccine adjuvanted  0.5 mL Intramuscular Tomorrow-1000   insulin aspart  0-5 Units Subcutaneous QHS   insulin aspart  0-9 Units Subcutaneous TID WC   lamoTRIgine  25 mg Oral BID   melatonin  2.5 mg Oral QHS   midodrine  10 mg Oral TID WC   mometasone-formoterol  2 puff Inhalation BID   montelukast  10 mg Oral QHS   multivitamin with minerals  1 tablet Oral Daily   mupirocin ointment   Nasal BID   polyethylene glycol-electrolytes  4,000 mL Oral Once   vitamin B-12  1,000 mcg Oral Daily   Continuous Infusions:  sodium chloride 10 mL/hr at 07/18/21 1310     LOS: 3 days    Time spent: 25 mins     Wyvonnia Dusky, MD [facility] Pager [phone]  If 7PM-7AM, please contact night-coverage  07/18/2021, 3:43 PM

## 2021-07-18 NOTE — Anesthesia Procedure Notes (Signed)
Date/Time: 07/18/2021 11:41 AM Performed by: Vaughan Sine Pre-anesthesia Checklist: Patient identified, Emergency Drugs available, Suction available, Patient being monitored and Timeout performed Patient Re-evaluated:Patient Re-evaluated prior to induction Oxygen Delivery Method: Nasal cannula Preoxygenation: Pre-oxygenation with 100% oxygen Induction Type: IV induction Airway Equipment and Method: Bite block Placement Confirmation: positive ETCO2 and CO2 detector

## 2021-07-18 NOTE — Progress Notes (Signed)
Okay from patient for girlfriend Larene Beach to get information on his status.  Asked if she wanted to speak to patient and she said she will call back in a few minutes.

## 2021-07-18 NOTE — Plan of Care (Signed)

## 2021-07-18 NOTE — Anesthesia Preprocedure Evaluation (Addendum)
Anesthesia Evaluation  Patient identified by MRN, date of birth, ID band Patient awake    Reviewed: Allergy & Precautions, NPO status , Patient's Chart, lab work & pertinent test results  History of Anesthesia Complications Negative for: history of anesthetic complications  Airway Mallampati: II  TM Distance: >3 FB Neck ROM: Full    Dental  (+) Edentulous Lower, Edentulous Upper   Pulmonary COPD, Current Smoker and Patient abstained from smoking.,    breath sounds clear to auscultation- rhonchi (-) decreased breath sounds(-) wheezing      Cardiovascular hypertension, + CAD, + CABG and + Peripheral Vascular Disease  (-) Past MI and (-) Cardiac Stents + dysrhythmias Atrial Fibrillation  Rhythm:Irregular Rate:Normal - Systolic murmurs and - Diastolic murmurs    Neuro/Psych neg Seizures PSYCHIATRIC DISORDERS negative neurological ROS     GI/Hepatic Neg liver ROS, GERD  ,GIB    Endo/Other  diabetes, Oral Hypoglycemic Agents  Renal/GU CRFRenal disease     Musculoskeletal negative musculoskeletal ROS (+)   Abdominal (+) - obese,   Peds  Hematology negative hematology ROS (+)   Anesthesia Other Findings Past Medical History: No date: Aortic stenosis     Comment:  a. Pt unaware of history but CT imaging 01/2019               consistent w/ AVR; b. 01/2019 Echo: Mild to mod AS. Mean               grad 19mmHg. No date: CAD (coronary artery disease)     Comment:  a. 1994 s/p CABG ([place]); b. Reports multiple               stress tests over the years w/o repeat cath; c. 01/2019               MV: large, sev, fixed inf and inflat defect extending to               apex. No ischemia. EF 37%-->Med rx given atypical Ss and               pt wishes. No date: CKD (chronic kidney disease), stage III (HCC) No date: COPD (chronic obstructive pulmonary disease) (HCC) No date: Depression No date: Essential hypertension No date: GERD  (gastroesophageal reflux disease) No date: Hyperlipidemia LDL goal <70 No date: Ischemic cardiomyopathy     Comment:  a. 01/2019 Echo: EF 40-45%, impaired relaxation. Mildly               dil LA. Sev mitral annular Ca2+. Mild to mod AS. No date: PAD (peripheral artery disease) ([place])     Comment:  a. 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 No date: PAF (paroxysmal atrial fibrillation) (HCC)     Comment:  a. CHA2DS2VASc = 4-->Xarelto 15mg  daily in setting of               CKD. No date: Thyrotoxicosis No date: Tobacco abuse No date: Type II diabetes mellitus (HCC)   Reproductive/Obstetrics                          Anesthesia Physical  Anesthesia Plan  ASA: 3  Anesthesia Plan: General   Post-op Pain Management:    Induction: Intravenous  PONV Risk Score and Plan: 1 and Treatment may vary due to age or medical condition, TIVA and Propofol infusion  Airway Management Planned: Natural Airway and Nasal Cannula  Additional Equipment:   Intra-op Plan:  Post-operative Plan:   Informed Consent:   Plan Discussed with:   Anesthesia Plan Comments:       Anesthesia Quick Evaluation

## 2021-07-19 ENCOUNTER — Encounter
Admission: EM | Disposition: A | Discharge status: Nursing Home (Includes ICF) | Payer: Self-pay | Source: Skilled Nursing Facility | Attending: Internal Medicine

## 2021-07-19 ENCOUNTER — Encounter: Payer: Self-pay | Admitting: Gastroenterology

## 2021-07-19 DIAGNOSIS — K921 Melena: Secondary | ICD-10-CM | POA: Diagnosis not present

## 2021-07-19 DIAGNOSIS — N1831 Chronic kidney disease, stage 3a: Secondary | ICD-10-CM

## 2021-07-19 DIAGNOSIS — D62 Acute posthemorrhagic anemia: Secondary | ICD-10-CM | POA: Diagnosis not present

## 2021-07-19 DIAGNOSIS — E876 Hypokalemia: Secondary | ICD-10-CM

## 2021-07-19 DIAGNOSIS — E1122 Type 2 diabetes mellitus with diabetic chronic kidney disease: Secondary | ICD-10-CM

## 2021-07-19 LAB — CBC
HCT: 27.2 % — ABNORMAL LOW (ref 39.0–52.0)
Hemoglobin: 9.1 g/dL — ABNORMAL LOW (ref 13.0–17.0)
MCH: 30.5 pg (ref 26.0–34.0)
MCHC: 33.5 g/dL (ref 30.0–36.0)
MCV: 91.3 fL (ref 80.0–100.0)
Platelets: 166 10*3/uL (ref 150–400)
RBC: 2.98 MIL/uL — ABNORMAL LOW (ref 4.22–5.81)
RDW: 15.9 % — ABNORMAL HIGH (ref 11.5–15.5)
WBC: 14.1 10*3/uL — ABNORMAL HIGH (ref 4.0–10.5)
nRBC: 0 % (ref 0.0–0.2)

## 2021-07-19 LAB — BASIC METABOLIC PANEL
Anion gap: 7 (ref 5–15)
BUN: 14 mg/dL (ref 8–23)
CO2: 21 mmol/L — ABNORMAL LOW (ref 22–32)
Calcium: 8.1 mg/dL — ABNORMAL LOW (ref 8.9–10.3)
Chloride: 113 mmol/L — ABNORMAL HIGH (ref 98–111)
Creatinine, Ser: 1.36 mg/dL — ABNORMAL HIGH (ref 0.61–1.24)
GFR, Estimated: 54 mL/min — ABNORMAL LOW (ref >60)
Glucose, Bld: 144 mg/dL — ABNORMAL HIGH (ref 70–99)
Potassium: 3.5 mmol/L (ref 3.5–5.1)
Sodium: 141 mmol/L (ref 135–145)

## 2021-07-19 LAB — FERRITIN: Ferritin: 86 ng/mL (ref 24–336)

## 2021-07-19 LAB — IRON AND TIBC
Iron: 10 ug/dL — ABNORMAL LOW (ref 45–182)
Saturation Ratios: 4 % — ABNORMAL LOW (ref 17.9–39.5)
TIBC: 225 ug/dL — ABNORMAL LOW (ref 250–450)
UIBC: 215 ug/dL

## 2021-07-19 LAB — VITAMIN B12: Vitamin B-12: 2236 pg/mL — ABNORMAL HIGH (ref 180–914)

## 2021-07-19 LAB — GLUCOSE, CAPILLARY
Glucose-Capillary: 139 mg/dL — ABNORMAL HIGH (ref 70–99)
Glucose-Capillary: 122 mg/dL — ABNORMAL HIGH (ref 70–99)
Glucose-Capillary: 172 mg/dL — ABNORMAL HIGH (ref 70–99)
Glucose-Capillary: 140 mg/dL — ABNORMAL HIGH (ref 70–99)

## 2021-07-19 LAB — FOLATE: Folate: 17.7 ng/mL (ref >5.9)

## 2021-07-19 LAB — SURGICAL PATHOLOGY

## 2021-07-19 SURGERY — COLONOSCOPY WITH PROPOFOL
Anesthesia: General

## 2021-07-19 MED ORDER — FLEET ENEMA 7-19 GM/118ML RE ENEM
1.0000 | ENEMA | Freq: Once | RECTAL | Status: AC
  Administered 2021-07-19: 1 via RECTAL

## 2021-07-19 MED ORDER — SODIUM CHLORIDE 0.9 % IV SOLN: INTRAVENOUS | Status: DC

## 2021-07-19 MED ORDER — POLYETHYLENE GLYCOL 3350 17 GM/SCOOP PO POWD
1.0000 | Freq: Once | ORAL | Status: DC
  Filled 2021-07-19 (×3): qty 255

## 2021-07-19 NOTE — Progress Notes (Signed)
PT Cancellation Note  Patient Details Name: Shawn Jennings MRN: [id number] DOB: [birth year]   Cancelled Treatment:    Reason Eval/Treat Not Completed: Medical issues which prohibited therapy. Patient doing prep for colonoscopy currently. Having frequent stool. Declines LE exercises. Will continue to follow and re-attempt tomorrow.      Renell Allum 07/19/2021, 3:35 PM

## 2021-07-19 NOTE — Progress Notes (Signed)
OT Cancellation Note  Patient Details Name: Shawn Jennings MRN: [id number] DOB: [birth year]   Cancelled Treatment:    Reason Eval/Treat Not Completed: Patient declined, no reason specified. Pt reports all aspects of therapeutic intervention this session and states, " I just don't want to". OT encouraged pt since he reports living at home alone and he continues to decline. RN reports pt has declined getting out of bed.   Darleen Crocker, MS, OTR/L , CBIS ascom [phone]  07/19/21, 10:10 AM

## 2021-07-19 NOTE — Progress Notes (Signed)
Shawn Darby, MD [address]  [place]  [place], [place] [address]  Main: [phone]  Fax: [phone] Pager: [phone]   Subjective: Did not finish his prep today to undergo colonoscopy.  He did not have a bowel movement even after finishing half of the prep.  He reports that his last bowel movement was before he got admitted which is 5 days ago.  He denies any abdominal pain.  He reports passing gas and flatus   Objective: Vital signs in last 24 hours: Vitals:   07/19/21 0700 07/19/21 0800 07/19/21 1100 07/19/21 1200  BP: (!) 126/56 130/62 (!) 139/56   Pulse: 60 66 (!) 58 64  Resp: (!) 25 (!) 25 (!) 23 (!) 22  Temp:  98.3 F (36.8 C)  98 F (36.7 C)  TempSrc:  Oral    SpO2: 95% 95% 99% 95%  Weight:       Weight change:   Intake/Output Summary (Last 24 hours) at 07/19/2021 1438 Last data filed at 07/19/2021 0900 Gross per 24 hour  Intake 1373.37 ml  Output 1250 ml  Net 123.37 ml     Exam: Heart:: Regular rate and rhythm or S1S2 present Lungs: normal and clear to auscultation Abdomen: soft, nontender, normal bowel sounds   Lab Results: @LABTEST2 @ Micro Results: Recent Results (from the past 240 hour(s))  Resp Panel by RT-PCR (Flu A&B, Covid) Nasopharyngeal Swab     Status: None   Collection Time: 07/15/21  2:46 PM   Specimen: Nasopharyngeal Swab; Nasopharyngeal(NP) swabs in vial transport medium  Result Value Ref Range Status   SARS Coronavirus 2 by RT PCR NEGATIVE NEGATIVE Final    Comment: (NOTE) SARS-CoV-2 target nucleic acids are NOT DETECTED.  The SARS-CoV-2 RNA is generally detectable in upper respiratory specimens during the acute phase of infection. The lowest concentration of SARS-CoV-2 viral copies this assay can detect is 138 copies/mL. A negative result does not preclude SARS-Cov-2 infection and should not be used as the sole basis for treatment or other patient management decisions. A negative result may occur with  improper  specimen collection/handling, submission of specimen other than nasopharyngeal swab, presence of viral mutation(s) within the areas targeted by this assay, and inadequate number of viral copies(<138 copies/mL). A negative result must be combined with clinical observations, patient history, and epidemiological information. The expected result is Negative.  Fact Sheet for Patients:  EntrepreneurPulse.com.au  Fact Sheet for Healthcare Providers:  IncredibleEmployment.be  This test is no t yet approved or cleared by the [place] FDA and  has been authorized for detection and/or diagnosis of SARS-CoV-2 by FDA under an Emergency Use Authorization (EUA). This EUA will remain  in effect (meaning this test can be used) for the duration of the COVID-19 declaration under Section 564(b)(1) of the Act, 21 U.S.C.section 360bbb-3(b)(1), unless the authorization is terminated  or revoked sooner.       Influenza A by PCR NEGATIVE NEGATIVE Final   Influenza B by PCR NEGATIVE NEGATIVE Final    Comment: (NOTE) The Xpert Xpress SARS-CoV-2/FLU/RSV plus assay is intended as an aid in the diagnosis of influenza from Nasopharyngeal swab specimens and should not be used as a sole basis for treatment. Nasal washings and aspirates are unacceptable for Xpert Xpress SARS-CoV-2/FLU/RSV testing.  Fact Sheet for Patients: EntrepreneurPulse.com.au  Fact Sheet for Healthcare Providers: IncredibleEmployment.be  This test is not yet approved or cleared by the [place] FDA and has been authorized for detection and/or diagnosis of SARS-CoV-2  by FDA under an Emergency Use Authorization (EUA). This EUA will remain in effect (meaning this test can be used) for the duration of the COVID-19 declaration under Section 564(b)(1) of the Act, 21 U.S.C. section 360bbb-3(b)(1), unless the authorization is terminated or revoked.  Performed at  [facility], [address] [place], [place] [address]   MRSA Next Gen by PCR, Nasal     Status: Abnormal   Collection Time: 07/16/21 12:25 PM   Specimen: Nasal Mucosa; Nasal Swab  Result Value Ref Range Status   MRSA by PCR Next Gen DETECTED (A) NOT DETECTED Final    Comment: RESULT CALLED TO, READ BACK BY AND VERIFIED WITH: Burnetta Sabin 07/16/21 1415 AMK (NOTE) The GeneXpert MRSA Assay (FDA approved for NASAL specimens only), is one component of a comprehensive MRSA colonization surveillance program. It is not intended to diagnose MRSA infection nor to guide or monitor treatment for MRSA infections. Test performance is not FDA approved in patients less than 23 years old. Performed at [facility], [address] [place], [place] [address]    Studies/Results: No results found. Medications: I have reviewed the patient's current medications. Prior to Admission:  Medications Prior to Admission  Medication Sig Dispense Refill Last Dose   acetaminophen (TYLENOL) 500 MG tablet Take 500 mg by mouth every 6 (six) hours as needed.   07/15/2021   albuterol (VENTOLIN HFA) 108 (90 Base) MCG/ACT inhaler Inhale 2 puffs into the lungs every 6 (six) hours as needed for wheezing or shortness of breath.       aspirin EC 81 MG tablet Take 81 mg by mouth daily.   07/15/2021   atorvastatin (LIPITOR) 80 MG tablet Take 80 mg by mouth at bedtime.   07/14/2021   cholecalciferol (VITAMIN D3) 25 MCG (1000 UNIT) tablet Take 1,000 Units by mouth every evening.   07/14/2021   dapagliflozin propanediol (FARXIGA) 5 MG TABS tablet Take 5 mg by mouth daily.   07/15/2021 at 0747   DULoxetine (CYMBALTA) 30 MG capsule Take 30 mg by mouth 2 (two) times daily.   07/15/2021 at 0747   ferrous sulfate 325 (65 FE) MG tablet Take 325 mg by mouth daily.   07/15/2021 at 0747   Fluticasone-Salmeterol (ADVAIR) 250-50 MCG/DOSE AEPB Inhale 1 puff into the lungs 2 (two) times daily.   07/15/2021 at 0747    furosemide (LASIX) 40 MG tablet Take 1 tablet (40 mg total) by mouth daily. Take 1 tablet (80 mg total) once daily in the morning (Patient taking differently: Take 40 mg by mouth daily.) 30 tablet 1 07/15/2021 at 0747   hydrOXYzine (ATARAX/VISTARIL) 25 MG tablet Take 25 mg by mouth every 6 (six) hours as needed (allergy symptoms).      isosorbide mononitrate (IMDUR) 30 MG 24 hr tablet Take 1 tablet (30 mg total) by mouth daily. 30 tablet 0 07/15/2021 at 0747   ketotifen (ZADITOR) 0.025 % ophthalmic solution Place 1 drop into both eyes 2 (two) times daily as needed.    07/15/2021 at 0750   lamoTRIgine (LAMICTAL) 25 MG tablet Take 25 mg by mouth 2 (two) times daily.   07/18/2021 at 0747   loratadine (CLARITIN) 10 MG tablet Take 10 mg by mouth daily.   07/15/2021 at 0747   Melatonin 3 MG TABS Take 1 tablet by mouth at bedtime.   07/14/2021   montelukast (SINGULAIR) 10 MG tablet Take 10 mg by mouth at bedtime.   07/14/2021   Multiple Vitamins-Minerals (PRESERVISION AREDS) CAPS  Take 1 capsule by mouth in the morning and at bedtime.   07/15/2021 at 0747   nitroGLYCERIN (NITROSTAT) 0.4 MG SL tablet Place 0.4 mg under the tongue every 5 (five) minutes as needed for chest pain.      pantoprazole (PROTONIX) 40 MG tablet Take 40 mg by mouth daily.   07/15/2021   potassium chloride SA (KLOR-CON) 20 MEQ tablet Take 1 tablet (20 mEq total) by mouth daily. 90 tablet 1 07/15/2021 at 0747   Rivaroxaban (XARELTO) 15 MG TABS tablet Take 15 mg by mouth daily.   07/15/2021 at 0747   sacubitril-valsartan (ENTRESTO) 24-26 MG Take 1 tablet by mouth 2 (two) times daily. 60 tablet 6 07/15/2021   senna (SENOKOT) 8.6 MG tablet Take 2 tablets by mouth at bedtime.   07/14/2021   tamsulosin (FLOMAX) 0.4 MG CAPS capsule Take 0.4 mg by mouth daily.   07/15/2021 at 0747   triamcinolone cream (KENALOG) 0.1 % Apply 1 application topically 2 (two) times daily as needed.      vitamin B-12 (CYANOCOBALAMIN) 1000 MCG tablet Take 1,000 mcg by mouth  daily.   07/14/2021   albuterol (PROVENTIL) (2.5 MG/3ML) 0.083% nebulizer solution Take 2.5 mg by nebulization every 4 (four) hours as needed for wheezing or shortness of breath. (Patient not taking: Reported on 07/15/2021)   Not Taking   carvedilol (COREG) 3.125 MG tablet Take 1 tablet (3.125 mg total) by mouth 2 (two) times daily. (Patient not taking: Reported on 07/15/2021) 60 tablet 1 Not Taking   lactulose (CHRONULAC) 10 GM/15ML solution Take 30 g by mouth every 12 (twelve) hours as needed for mild constipation. (Patient not taking: Reported on 07/15/2021)   Not Taking   lidocaine (LIDODERM) 5 % Cut 1 patch in half and apply to amputated stump once daily 12 hours on, 12 hours off (Patient not taking: Reported on 07/15/2021)   Not Taking   liver oil-zinc oxide (DESITIN) 40 % ointment Apply 1 application topically at bedtime as needed for irritation. To buttocks (Patient not taking: Reported on 07/15/2021)   Not Taking   loperamide (IMODIUM) 2 MG capsule Take by mouth. (Patient not taking: No sig reported)   Not Taking   LORazepam (ATIVAN) 0.5 MG tablet Take 0.5 mg by mouth 2 (two) times daily as needed for anxiety. (Patient not taking: Reported on 07/15/2021)   Not Taking   metFORMIN (GLUCOPHAGE) 1000 MG tablet Take 1,000 mg by mouth daily with breakfast. (Patient not taking: No sig reported)   Not Taking   predniSONE (DELTASONE) 20 MG tablet 3 tablets daily x 4 days (Patient not taking: No sig reported) 12 tablet 0 Not Taking   sodium bicarbonate 650 MG tablet Take 1 tablet (650 mg total) by mouth 2 (two) times daily. (Patient not taking: No sig reported) 60 tablet 0 Not Taking   traZODone (DESYREL) 50 MG tablet Take 25 mg by mouth at bedtime as needed for sleep. (Patient not taking: No sig reported)   Not Taking   Scheduled:  (feeding supplement) PROSource Plus  30 mL Oral BID BM   atorvastatin  80 mg Oral QHS   Chlorhexidine Gluconate Cloth  6 each Topical Daily   DULoxetine  30 mg Oral BID    feeding supplement  1 Container Oral TID BM   influenza vaccine adjuvanted  0.5 mL Intramuscular Tomorrow-1000   insulin aspart  0-5 Units Subcutaneous QHS   insulin aspart  0-9 Units Subcutaneous TID WC   lamoTRIgine  25 mg Oral  BID   melatonin  2.5 mg Oral QHS   midodrine  10 mg Oral TID WC   mometasone-formoterol  2 puff Inhalation BID   montelukast  10 mg Oral QHS   multivitamin with minerals  1 tablet Oral Daily   mupirocin ointment   Nasal BID   vitamin B-12  1,000 mcg Oral Daily   Continuous:  sodium chloride 10 mL/hr at 07/18/21 1310   HGD:JMEQASTMH, LORazepam, morphine injection, nicotine, nitroGLYCERIN, ondansetron **OR** ondansetron (ZOFRAN) IV, oxyCODONE-acetaminophen Anti-infectives (From admission, onward)    Start     Dose/Rate Route Frequency Ordered Stop   07/15/21 1300  cefTRIAXone (ROCEPHIN) 1 g in sodium chloride 0.9 % 100 mL IVPB        1 g 200 mL/hr over 30 Minutes Intravenous  Once 07/15/21 1254 07/15/21 1652   07/15/21 1300  metroNIDAZOLE (FLAGYL) IVPB 500 mg        500 mg 100 mL/hr over 60 Minutes Intravenous  Once 07/15/21 1254 07/15/21 1754      Scheduled Meds:  (feeding supplement) PROSource Plus  30 mL Oral BID BM   atorvastatin  80 mg Oral QHS   Chlorhexidine Gluconate Cloth  6 each Topical Daily   DULoxetine  30 mg Oral BID   feeding supplement  1 Container Oral TID BM   influenza vaccine adjuvanted  0.5 mL Intramuscular Tomorrow-1000   insulin aspart  0-5 Units Subcutaneous QHS   insulin aspart  0-9 Units Subcutaneous TID WC   lamoTRIgine  25 mg Oral BID   melatonin  2.5 mg Oral QHS   midodrine  10 mg Oral TID WC   mometasone-formoterol  2 puff Inhalation BID   montelukast  10 mg Oral QHS   multivitamin with minerals  1 tablet Oral Daily   mupirocin ointment   Nasal BID   vitamin B-12  1,000 mcg Oral Daily   Continuous Infusions:  sodium chloride 10 mL/hr at 07/18/21 1310   PRN Meds:.albuterol, LORazepam, morphine injection, nicotine,  nitroGLYCERIN, ondansetron **OR** ondansetron (ZOFRAN) IV, oxyCODONE-acetaminophen   Assessment: Principal Problem:   GI bleed Active Problems:   Chronic kidney disease, stage 3a ([place])   Essential hypertension   Coronary artery disease of native artery of native heart with stable angina pectoris (HCC)   Protein-calorie malnutrition, severe   AF (paroxysmal atrial fibrillation) (HCC)   On anticoagulant therapy   Depression   GERD (gastroesophageal reflux disease)   Tobacco abuse   Hyperlipidemia   Abdominal pain   Acute blood loss anemia   Hypokalemia   Type 2 diabetes mellitus with stage 3a chronic kidney disease, without long-term current use of insulin ([place])  Plan: Acute GI bleed, coffee-ground emesis and melena No active bleeding at this time, hemoglobin is stable Check iron panel, B12 and folate levels S/p EGD on 9/27 which revealed several healing superficial erosions.  Given that patient had a significant drop in hemoglobin and he did not undergo colonoscopy in his life, I recommended colonoscopy particularly to rule out malignancy Patient is still drinking prep, administer Fleet enema Dulcolax suppository if needed Will postpone colonoscopy until tomorrow Resume clear liquids today   LOS: 4 days   Mujahid Jalomo 07/19/2021, 2:38 PM

## 2021-07-19 NOTE — TOC Initial Note (Signed)
Transition of Care [facility]) - Initial/Assessment Note    Patient Details  Name: Shawn Jennings MRN: [id number] Date of Birth: 11/12/1945  Transition of Care [facility]) CM/SW Contact:    Kerin Salen, RN Phone Number: 07/19/2021, 1:35 PM  Clinical Narrative:  Damaris Schooner with [facility] Resident Coordinator Marcine Matar, [phone] says patient is in the ALF there and able to make his own decisions. However he can return to [facility] when medically stable.                       Patient Goals and CMS Choice        Expected Discharge Plan and Services                                                Prior Living Arrangements/Services                       Activities of Daily Living      Permission Sought/Granted                  Emotional Assessment              Admission diagnosis:  Shortness of breath [R06.02] GI bleed [K92.2] Upper GI bleed [K92.2] Abdominal pain [R10.9] Patient Active Problem List   Diagnosis Date Noted   Acute blood loss anemia    Hypokalemia    Type 2 diabetes mellitus with stage 3a chronic kidney disease, without long-term current use of insulin (HCC)    Abdominal pain    GI bleed 07/15/2021   AF (paroxysmal atrial fibrillation) ([place]) 07/15/2021   On anticoagulant therapy 07/15/2021   Depression 07/15/2021   GERD (gastroesophageal reflux disease) 07/15/2021   Tobacco abuse 07/15/2021   Hyperlipidemia 07/15/2021   Upper GI bleed    Encounter for fitting and adjustment of other gastrointestinal appliance and device    Cholangitis    Protein-calorie malnutrition, severe 10/18/2020   Choledocholithiasis 10/17/2020   Coronary artery disease of native artery of native heart with stable angina pectoris ([place])    Ischemic cardiomyopathy    Other chest pain 03/10/2020   Chronic kidney disease, stage 3a ([place]) 03/09/2020   Essential hypertension 03/09/2020   Chest pain 01/27/2019   Renal insufficiency     Nonspecific chest pain 01/26/2019   PCP:  [facility]:   [place], [address] [place] [address] Phone: [phone] Fax: [phone]     Social Determinants of Health (SDOH) Interventions    Readmission Risk Interventions No flowsheet data found.

## 2021-07-19 NOTE — Plan of Care (Signed)

## 2021-07-19 NOTE — Progress Notes (Signed)
Patient ID: Shawn Jennings, male   DOB: 05-17-1946, 75 y.o.   MRN: [id number] [facility] PROGRESS NOTE  Shawn Jennings VOH:[id number] DOB: 08/01/1946 DOA: 07/15/2021 PCP: [facility]  HPI/Subjective: Patient feels okay.  Offers no complaints.  He was seen this morning and stated he has not had a bowel movement with the colonoscopy prep.  Admitted with nausea vomiting and black stools for 3 days prior to coming into the hospital.  Objective: Vitals:   07/19/21 1100 07/19/21 1200  BP: (!) 139/56   Pulse: (!) 58 64  Resp: (!) 23 (!) 22  Temp:  98 F (36.7 C)  SpO2: 99% 95%    Intake/Output Summary (Last 24 hours) at 07/19/2021 1312 Last data filed at 07/19/2021 0900 Gross per 24 hour  Intake 1373.37 ml  Output 1250 ml  Net 123.37 ml   Filed Weights   07/15/21 1049  Weight: 67 kg    ROS: Review of Systems  Respiratory:  Negative for shortness of breath.   Cardiovascular:  Negative for chest pain.  Gastrointestinal:  Positive for constipation. Negative for abdominal pain, nausea and vomiting.  Exam: Physical Exam HENT:     Head: Normocephalic.     Mouth/Throat:     Pharynx: No oropharyngeal exudate.  Eyes:     General: Lids are normal.     Conjunctiva/sclera: Conjunctivae normal.     Pupils: Pupils are equal, round, and reactive to light.  Cardiovascular:     Rate and Rhythm: Normal rate and regular rhythm.     Heart sounds: Normal heart sounds, S1 normal and S2 normal.  Pulmonary:     Breath sounds: Normal breath sounds. No decreased breath sounds, wheezing, rhonchi or rales.  Abdominal:     Palpations: Abdomen is soft.     Tenderness: There is no abdominal tenderness.  Musculoskeletal:     Right lower leg: No swelling.  Skin:    General: Skin is warm.     Findings: No rash.  Neurological:     Mental Status: He is alert.     Comments: Answers questions appropriately.      Scheduled Meds:  (feeding supplement) PROSource Plus  30 mL Oral  BID BM   atorvastatin  80 mg Oral QHS   Chlorhexidine Gluconate Cloth  6 each Topical Daily   DULoxetine  30 mg Oral BID   feeding supplement  1 Container Oral TID BM   influenza vaccine adjuvanted  0.5 mL Intramuscular Tomorrow-1000   insulin aspart  0-5 Units Subcutaneous QHS   insulin aspart  0-9 Units Subcutaneous TID WC   lamoTRIgine  25 mg Oral BID   melatonin  2.5 mg Oral QHS   midodrine  10 mg Oral TID WC   mometasone-formoterol  2 puff Inhalation BID   montelukast  10 mg Oral QHS   multivitamin with minerals  1 tablet Oral Daily   mupirocin ointment   Nasal BID   vitamin B-12  1,000 mcg Oral Daily   Continuous Infusions:  sodium chloride 10 mL/hr at 07/18/21 1310    Assessment/Plan:  Acute blood loss anemia with GI bleed.  Patient received 2 units of packed red blood cells and hemoglobin stable.  EGD showed erosive gastropathy with no acute bleeding.  Colonoscopy for today.  Hopefully can get out of the ICU.  Last hemoglobin 9.1. Hypotension on midodrine Chronic kidney disease stage IIIa.  Creatinine improved from 1.65 down to 1.36. Hypokalemia replaced Depression on Cymbalta Type 2 diabetes mellitus with  chronic disease stage IIIa.  hemoglobin A1c 6.5.  This is well controlled. History of CAD.  Hold aspirin Coreg Imdur and Entresto with hypotension and bleeding. 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        Code Status:     Code Status Orders  (From admission, onward)           Start     Ordered   07/15/21 1507  Limited resuscitation (code)  Continuous       Question Answer Comment  In the event of cardiac or respiratory ARREST: Initiate Code Blue, Call Rapid Response No   In the event of cardiac or respiratory ARREST: Perform CPR No   In the event of cardiac or respiratory ARREST: Perform Intubation/Mechanical Ventilation Yes   In the event of cardiac or respiratory ARREST:  Use NIPPV/BiPAp only if indicated Yes   In the event of cardiac or respiratory ARREST: Administer ACLS medications if indicated No   In the event of cardiac or respiratory ARREST: Perform Defibrillation or Cardioversion if indicated No      07/15/21 1507           Code Status History     Date Active Date Inactive Code Status Order ID Comments User Context   07/15/2021 1414 07/15/2021 1507 Full Code [id number]  CoxBriant Cedar, DO ED   10/17/2020 2151 10/25/2020 2246 Full Code [id number]  Mansy, Arvella Merles, MD ED   06/06/2020 1304 06/06/2020 2019 Full Code [id number]  Wellington Hampshire, MD Inpatient   03/09/2020 1753 03/12/2020 0754 Full Code [id number]  Sharen Hones, MD ED   01/26/2019 1439 01/28/2019 1634 Full Code [id number]  Saundra Shelling, MD ED      Advance Directive Documentation    Flowsheet Row Most Recent Value  Type of Advance Directive --  [PARTIAL]  Pre-existing out of facility DNR order (yellow form or pink MOST form) --  "MOST" Form in Place? --      Family Communication: No contact number in the computer Disposition Plan: Status is: Inpatient  Dispo: The patient is from: Rehab              Anticipated d/c is to: Rehab              Patient currently to have colonoscopy today   Difficult to place patient.  No.  Consultants: Gastroenterology  Procedures: EGD  Time spent: 27 minutes  [place]

## 2021-07-19 NOTE — Anesthesia Postprocedure Evaluation (Signed)
Anesthesia Post Note  Patient: Shawn Jennings  Procedure(s) Performed: ESOPHAGOGASTRODUODENOSCOPY (EGD) WITH PROPOFOL  Patient location during evaluation: Endoscopy Anesthesia Type: General Level of consciousness: awake and alert and oriented Pain management: pain level controlled Vital Signs Assessment: post-procedure vital signs reviewed and stable Respiratory status: spontaneous breathing, nonlabored ventilation and respiratory function stable Cardiovascular status: blood pressure returned to baseline and stable Postop Assessment: no signs of nausea or vomiting Anesthetic complications: no   No notable events documented.   Last Vitals:  Vitals:   07/19/21 0700 07/19/21 0800  BP: (!) 126/56 130/62  Pulse: 60 66  Resp: (!) 25 (!) 25  Temp:    SpO2: 95% 95%    Last Pain:  Vitals:   07/19/21 0300  TempSrc: Oral  PainSc:                  Nani Ingram

## 2021-07-20 ENCOUNTER — Encounter
Admission: EM | Disposition: A | Discharge status: Nursing Home (Includes ICF) | Payer: Medicare Other | Source: Skilled Nursing Facility | Attending: Internal Medicine

## 2021-07-20 ENCOUNTER — Inpatient Hospital Stay: Payer: Medicare Other | Admitting: Anesthesiology

## 2021-07-20 ENCOUNTER — Encounter: Payer: Self-pay | Admitting: Internal Medicine

## 2021-07-20 DIAGNOSIS — K635 Polyp of colon: Secondary | ICD-10-CM

## 2021-07-20 DIAGNOSIS — N179 Acute kidney failure, unspecified: Secondary | ICD-10-CM

## 2021-07-20 DIAGNOSIS — D62 Acute posthemorrhagic anemia: Secondary | ICD-10-CM | POA: Diagnosis not present

## 2021-07-20 DIAGNOSIS — I959 Hypotension, unspecified: Secondary | ICD-10-CM

## 2021-07-20 DIAGNOSIS — R933 Abnormal findings on diagnostic imaging of other parts of digestive tract: Secondary | ICD-10-CM

## 2021-07-20 DIAGNOSIS — K644 Residual hemorrhoidal skin tags: Secondary | ICD-10-CM

## 2021-07-20 DIAGNOSIS — N189 Chronic kidney disease, unspecified: Secondary | ICD-10-CM

## 2021-07-20 HISTORY — PX: COLONOSCOPY WITH PROPOFOL: SHX5780

## 2021-07-20 LAB — BASIC METABOLIC PANEL WITH GFR
Anion gap: 7 (ref 5–15)
BUN: 11 mg/dL (ref 8–23)
CO2: 22 mmol/L (ref 22–32)
Calcium: 8.2 mg/dL — ABNORMAL LOW (ref 8.9–10.3)
Chloride: 112 mmol/L — ABNORMAL HIGH (ref 98–111)
Creatinine, Ser: 1.35 mg/dL — ABNORMAL HIGH (ref 0.61–1.24)
GFR, Estimated: 54 mL/min — ABNORMAL LOW
Glucose, Bld: 162 mg/dL — ABNORMAL HIGH (ref 70–99)
Potassium: 3.5 mmol/L (ref 3.5–5.1)
Sodium: 141 mmol/L (ref 135–145)

## 2021-07-20 LAB — GLUCOSE, CAPILLARY
Glucose-Capillary: 155 mg/dL — ABNORMAL HIGH (ref 70–99)
Glucose-Capillary: 162 mg/dL — ABNORMAL HIGH (ref 70–99)
Glucose-Capillary: 142 mg/dL — ABNORMAL HIGH (ref 70–99)
Glucose-Capillary: 117 mg/dL — ABNORMAL HIGH (ref 70–99)
Glucose-Capillary: 140 mg/dL — ABNORMAL HIGH (ref 70–99)

## 2021-07-20 LAB — CBC WITH DIFFERENTIAL/PLATELET
Abs Immature Granulocytes: 0.08 10*3/uL — ABNORMAL HIGH (ref 0.00–0.07)
Basophils Absolute: 0.1 10*3/uL (ref 0.0–0.1)
Basophils Relative: 1 %
Eosinophils Absolute: 0.5 10*3/uL (ref 0.0–0.5)
Eosinophils Relative: 4 %
HCT: 27.7 % — ABNORMAL LOW (ref 39.0–52.0)
Hemoglobin: 9.2 g/dL — ABNORMAL LOW (ref 13.0–17.0)
Immature Granulocytes: 1 %
Lymphocytes Relative: 10 %
Lymphs Abs: 1.4 10*3/uL (ref 0.7–4.0)
MCH: 30.1 pg (ref 26.0–34.0)
MCHC: 33.2 g/dL (ref 30.0–36.0)
MCV: 90.5 fL (ref 80.0–100.0)
Monocytes Absolute: 1.2 10*3/uL — ABNORMAL HIGH (ref 0.1–1.0)
Monocytes Relative: 9 %
Neutro Abs: 10.4 10*3/uL — ABNORMAL HIGH (ref 1.7–7.7)
Neutrophils Relative %: 75 %
Platelets: 187 10*3/uL (ref 150–400)
RBC: 3.06 MIL/uL — ABNORMAL LOW (ref 4.22–5.81)
RDW: 15.6 % — ABNORMAL HIGH (ref 11.5–15.5)
WBC: 13.6 10*3/uL — ABNORMAL HIGH (ref 4.0–10.5)
nRBC: 0 % (ref 0.0–0.2)

## 2021-07-20 LAB — MAGNESIUM: Magnesium: 1.8 mg/dL (ref 1.7–2.4)

## 2021-07-20 SURGERY — COLONOSCOPY WITH PROPOFOL
Anesthesia: General

## 2021-07-20 MED ORDER — LIDOCAINE 2% (20 MG/ML) 5 ML SYRINGE
INTRAMUSCULAR | Status: DC | PRN
  Administered 2021-07-20: 25 mg via INTRAVENOUS

## 2021-07-20 MED ORDER — APIXABAN 5 MG PO TABS: 5.0000 mg | ORAL_TABLET | Freq: Two times a day (BID) | ORAL | 0 refills | Status: DC

## 2021-07-20 MED ORDER — NICOTINE 14 MG/24HR TD PT24: MEDICATED_PATCH | TRANSDERMAL | 0 refills | Status: DC

## 2021-07-20 MED ORDER — PROPOFOL 10 MG/ML IV BOLUS
INTRAVENOUS | Status: DC | PRN
  Administered 2021-07-20: 70 mg via INTRAVENOUS

## 2021-07-20 MED ORDER — MIDODRINE HCL 5 MG PO TABS: 5.0000 mg | ORAL_TABLET | Freq: Three times a day (TID) | ORAL | 0 refills | Status: DC

## 2021-07-20 MED ORDER — PROPOFOL 500 MG/50ML IV EMUL
INTRAVENOUS | Status: DC | PRN
  Administered 2021-07-20: 120 ug/kg/min via INTRAVENOUS

## 2021-07-20 MED ORDER — MAGNESIUM OXIDE -MG SUPPLEMENT 400 (240 MG) MG PO TABS
400.0000 mg | ORAL_TABLET | Freq: Once | ORAL | Status: AC
  Administered 2021-07-20: 400 mg via ORAL
  Filled 2021-07-20: qty 1

## 2021-07-20 MED ORDER — EPHEDRINE 5 MG/ML INJ
INTRAVENOUS | Status: AC
  Filled 2021-07-20: qty 10

## 2021-07-20 MED ORDER — POTASSIUM CHLORIDE CRYS ER 20 MEQ PO TBCR
40.0000 meq | EXTENDED_RELEASE_TABLET | Freq: Once | ORAL | Status: AC
  Administered 2021-07-20: 40 meq via ORAL
  Filled 2021-07-20: qty 2

## 2021-07-20 MED ORDER — EPHEDRINE SULFATE 50 MG/ML IJ SOLN
INTRAMUSCULAR | Status: DC | PRN
  Administered 2021-07-20 (×4): 10 mg via INTRAVENOUS

## 2021-07-20 NOTE — Progress Notes (Signed)
PT Cancellation Note  Patient Details Name: Mandrell Vangilder MRN: [id number] DOB: [birth year]   Cancelled Treatment:     Patient off the floor for a procedure. PT will continue with attempts.   Minna Merritts, PT, MPT  [name] 07/20/2021, 3:44 PM

## 2021-07-20 NOTE — Progress Notes (Signed)
OT Cancellation Note  Patient Details Name: Roderick Calo MRN: [id number] DOB: [birth year]   Cancelled Treatment:    Reason Eval/Treat Not Completed: Patient at procedure or test/ unavailable Pt OTF undergoing colonoscopy with propofol per chart review. Will require continue @ transfer to resume OT services after procedure if appropriate. Thank you.  Gerrianne Scale, [place], OTR/L ascom [phone] 07/20/21, 9:27 AM

## 2021-07-20 NOTE — Progress Notes (Signed)
Report called to [facility] ALF to Jolyn Nap who verbalized understanding and acceptance of patient.  Patient returning to his room there with Home health.  Informed patient is 3rd on list for EMS pick up.

## 2021-07-20 NOTE — TOC Transition Note (Signed)
Transition of Care [facility]) - CM/SW Discharge Note   Patient Details  Name: Karsen Nakanishi MRN: [id number] Date of Birth: 09-Dec-1945  [facility]) CM/SW Contact:  Candie Chroman, LCSW Phone Number: 07/20/2021, 5:05 PM   Clinical Narrative:  Patient has orders to discharge to [facility] ALF today. Patient agreeable to home health and has no agency preference. Set up with [facility] for RN, PT, and OT. RN will call report to [phone]. EMS has been arranged and he is 3rd on the list. No further concerns. CSW signing off.   Final next level of care: Assisted Living (with home health) Barriers to Discharge: Barriers Resolved   Patient Goals and CMS Choice        Discharge Placement                Patient to be transferred to facility by: EMS   Patient and family notified of of transfer: 07/20/21  Discharge Plan and Services                          HH Arranged: RN, PT, OT [facility] Agency: [facility] Date [place]: 07/20/21   Representative spoke with at [facility]: Floydene Flock  Social Determinants of Health (SDOH) Interventions     Readmission Risk Interventions No flowsheet data found.

## 2021-07-20 NOTE — NC FL2 (Signed)
[place] LEVEL OF CARE SCREENING TOOL     IDENTIFICATION  Patient Name: Shawn Jennings Birthdate: Jul 17, 1946 Sex: male Admission Date (Current Location): 07/15/2021  [facility] and [place] Number:  Engineering geologist and Address:  [facility], [address] [place], [place] [address]      Provider Number: [id number]  Attending Physician Name and Address:  Loletha Grayer, MD  Relative Name and Phone Number:       Current Level of Care: Hospital Recommended Level of Care: [place] (with PT, OT, RN through [place]) Prior Approval Number:    Date Approved/Denied:   PASRR Number:    Discharge Plan: Other (Comment) (ALF with PT, OT, RN through [place])    Current Diagnoses: Patient Active Problem List   Diagnosis Date Noted   Hypotension    Acute kidney injury superimposed on CKD ([place])    Acute blood loss anemia    Hypokalemia    Type 2 diabetes mellitus with stage 3a chronic kidney disease, without long-term current use of insulin (HCC)    Abdominal pain    GI bleed 07/15/2021   AF (paroxysmal atrial fibrillation) ([place]) 07/15/2021   On anticoagulant therapy 07/15/2021   Depression 07/15/2021   GERD (gastroesophageal reflux disease) 07/15/2021   Tobacco abuse 07/15/2021   Hyperlipidemia 07/15/2021   Upper GI bleed    Encounter for fitting and adjustment of other gastrointestinal appliance and device    Cholangitis    Protein-calorie malnutrition, severe 10/18/2020   Choledocholithiasis 10/17/2020   Coronary artery disease of native artery of native heart with stable angina pectoris (HCC)    Ischemic cardiomyopathy    Other chest pain 03/10/2020   Chronic kidney disease, stage 3a ([place]) 03/09/2020   Essential hypertension 03/09/2020   Chest pain 01/27/2019   Renal insufficiency    Nonspecific chest pain 01/26/2019    Orientation RESPIRATION BLADDER Height & Weight     Self, Time,  Situation, Place  Normal Incontinent Weight: 147 lb 11.3 oz (67 kg) Height:     BEHAVIORAL SYMPTOMS/MOOD NEUROLOGICAL BOWEL NUTRITION STATUS   (None)  (None) Incontinent Diet (No added salt)  AMBULATORY STATUS COMMUNICATION OF NEEDS Skin   Extensive Assist Verbally Other (Comment) (Left leg amputation)                       Personal Care Assistance Level of Assistance  Bathing, Feeding, Dressing Bathing Assistance: Limited assistance Feeding assistance: Limited assistance Dressing Assistance: Limited assistance     Functional Limitations Info  Sight, Hearing, Speech Sight Info: Adequate Hearing Info: Adequate Speech Info: Adequate    SPECIAL CARE FACTORS FREQUENCY  PT (By licensed PT), OT (By licensed OT)     PT Frequency: 3 x week OT Frequency: 3 x week            Contractures Contractures Info: Not present    Additional Factors Info  Code Status, Allergies Code Status Info: Partial: No code blue/rapid response, No CPR, No ACLS medications, No defibrillation/cardioversion Allergies Info: NKDA           Current Medications (07/20/2021):  This is the current hospital active medication list Current Facility-Administered Medications  Medication Dose Route Frequency Provider Last Rate Last Admin   (feeding supplement) PROSource Plus liquid 30 mL  30 mL Oral BID BM Wyvonnia Dusky, MD   30 mL at 07/19/21 1433   0.9 %  sodium chloride infusion  Intravenous Continuous Lin Landsman, MD   Stopped at 07/20/21 1319   0.9 %  sodium chloride infusion   Intravenous Continuous Marius Ditch, Tally Due, MD 10 mL/hr at 07/19/21 1842 Infusion Verify at 07/19/21 1842   albuterol (PROVENTIL) (2.5 MG/3ML) 0.083% nebulizer solution 2.5 mg  2.5 mg Nebulization Q4H PRN Cox, Amy N, DO       atorvastatin (LIPITOR) tablet 80 mg  80 mg Oral QHS Cox, Amy N, DO   80 mg at 07/19/21 2100   Chlorhexidine Gluconate Cloth 2 % PADS 6 each  6 each Topical Daily Cox, Amy N, DO   6 each at  07/20/21 1017   DULoxetine (CYMBALTA) DR capsule 30 mg  30 mg Oral BID Cox, Amy N, DO   30 mg at 07/19/21 2100   feeding supplement (BOOST / RESOURCE BREEZE) liquid 1 Container  1 Container Oral TID BM Wyvonnia Dusky, MD   1 Container at 07/19/21 1432   influenza vaccine adjuvanted (FLUAD) injection 0.5 mL  0.5 mL Intramuscular Tomorrow-1000 Wyvonnia Dusky, MD       insulin aspart (novoLOG) injection 0-5 Units  0-5 Units Subcutaneous QHS Cox, Amy N, DO       insulin aspart (novoLOG) injection 0-9 Units  0-9 Units Subcutaneous TID WC Cox, Amy N, DO   2 Units at 07/20/21 0854   lamoTRIgine (LAMICTAL) tablet 25 mg  25 mg Oral BID Cox, Amy N, DO   25 mg at 07/19/21 2100   LORazepam (ATIVAN) tablet 0.5 mg  0.5 mg Oral BID PRN Cox, Amy N, DO       melatonin tablet 2.5 mg  2.5 mg Oral QHS Cox, Amy N, DO   2.5 mg at 07/19/21 2100   midodrine (PROAMATINE) tablet 10 mg  10 mg Oral TID WC Wyvonnia Dusky, MD   10 mg at 07/19/21 1717   mometasone-formoterol (DULERA) 200-5 MCG/ACT inhaler 2 puff  2 puff Inhalation BID Cox, Amy N, DO   2 puff at 07/20/21 0853   montelukast (SINGULAIR) tablet 10 mg  10 mg Oral QHS Cox, Amy N, DO   10 mg at 07/19/21 2100   morphine 2 MG/ML injection 1 mg  1 mg Intravenous Q4H PRN Wyvonnia Dusky, MD   1 mg at 07/17/21 1119   multivitamin with minerals tablet 1 tablet  1 tablet Oral Daily Wyvonnia Dusky, MD   1 tablet at 07/18/21 0854   mupirocin ointment (BACTROBAN) 2 %   Nasal BID Wyvonnia Dusky, MD   1 application at 67/12/45 1019   nicotine (NICODERM CQ - dosed in mg/24 hours) patch 14 mg  14 mg Transdermal Daily PRN Cox, Amy N, DO       nitroGLYCERIN (NITROSTAT) SL tablet 0.4 mg  0.4 mg Sublingual Q5 min PRN Cox, Amy N, DO       ondansetron (ZOFRAN) tablet 4 mg  4 mg Oral Q6H PRN Cox, Amy N, DO       Or   ondansetron (ZOFRAN) injection 4 mg  4 mg Intravenous Q6H PRN Cox, Amy N, DO   4 mg at 07/16/21 1454   oxyCODONE-acetaminophen  (PERCOCET/ROXICET) 5-325 MG per tablet 1 tablet  1 tablet Oral Q6H PRN Wyvonnia Dusky, MD   1 tablet at 07/17/21 0956   polyethylene glycol powder (GLYCOLAX/MIRALAX) container 255 g  1 Container Oral Once Lin Landsman, MD       vitamin B-12 (CYANOCOBALAMIN) tablet 1,000 mcg  1,000  mcg Oral Daily Cox, Amy N, DO   1,000 mcg at 07/18/21 0854     Discharge Medications: STOP taking these medications     aspirin EC 81 MG tablet    carvedilol 3.125 MG tablet Commonly known as: COREG    Entresto 24-26 MG Generic drug: sacubitril-valsartan    ferrous sulfate 325 (65 FE) MG tablet    furosemide 40 MG tablet Commonly known as: LASIX    lactulose 10 GM/15ML solution Commonly known as: CHRONULAC    lidocaine 5 % Commonly known as: LIDODERM    liver oil-zinc oxide 40 % ointment Commonly known as: DESITIN    loperamide 2 MG capsule Commonly known as: IMODIUM    LORazepam 0.5 MG tablet Commonly known as: ATIVAN    metFORMIN 1000 MG tablet Commonly known as: GLUCOPHAGE    potassium chloride SA 20 MEQ tablet Commonly known as: KLOR-CON    predniSONE 20 MG tablet Commonly known as: DELTASONE    Rivaroxaban 15 MG Tabs tablet Commonly known as: XARELTO    senna 8.6 MG tablet Commonly known as: SENOKOT    sodium bicarbonate 650 MG tablet    tamsulosin 0.4 MG Caps capsule Commonly known as: FLOMAX    traZODone 50 MG tablet Commonly known as: DESYREL    triamcinolone cream 0.1 % Commonly known as: KENALOG           TAKE these medications     acetaminophen 500 MG tablet Commonly known as: TYLENOL Take 500 mg by mouth every 6 (six) hours as needed.    apixaban 5 MG Tabs tablet Commonly known as: ELIQUIS Take 1 tablet (5 mg total) by mouth 2 (two) times daily. Start taking on: July 22, 2021    atorvastatin 80 MG tablet Commonly known as: LIPITOR Take 80 mg by mouth at bedtime.    cholecalciferol 25 MCG (1000 UNIT) tablet Commonly known as: VITAMIN  D3 Take 1,000 Units by mouth every evening.    dapagliflozin propanediol 5 MG Tabs tablet Commonly known as: FARXIGA Take 5 mg by mouth daily.    DULoxetine 30 MG capsule Commonly known as: CYMBALTA Take 30 mg by mouth 2 (two) times daily.    Fluticasone-Salmeterol 250-50 MCG/DOSE Aepb Commonly known as: ADVAIR Inhale 1 puff into the lungs 2 (two) times daily.    hydrOXYzine 25 MG tablet Commonly known as: ATARAX/VISTARIL Take 25 mg by mouth every 6 (six) hours as needed (allergy symptoms).    isosorbide mononitrate 30 MG 24 hr tablet Commonly known as: IMDUR Take 1 tablet (30 mg total) by mouth daily.    ketotifen 0.025 % ophthalmic solution Commonly known as: ZADITOR Place 1 drop into both eyes 2 (two) times daily as needed.    lamoTRIgine 25 MG tablet Commonly known as: LAMICTAL Take 25 mg by mouth 2 (two) times daily.    loratadine 10 MG tablet Commonly known as: CLARITIN Take 10 mg by mouth daily.    melatonin 3 MG Tabs tablet Take 1 tablet by mouth at bedtime.    midodrine 5 MG tablet Commonly known as: PROAMATINE Take 1 tablet (5 mg total) by mouth 3 (three) times daily with meals.    montelukast 10 MG tablet Commonly known as: SINGULAIR Take 10 mg by mouth at bedtime.    nicotine 14 mg/24hr patch Commonly known as: NICODERM CQ - dosed in mg/24 hours One patch chest wall daily (okay to substitute generic)    nitroGLYCERIN 0.4 MG SL tablet Commonly known as: NITROSTAT  Place 0.4 mg under the tongue every 5 (five) minutes as needed for chest pain.    pantoprazole 40 MG tablet Commonly known as: PROTONIX Take 40 mg by mouth daily.    PreserVision AREDS Caps Take 1 capsule by mouth in the morning and at bedtime.    Ventolin HFA 108 (90 Base) MCG/ACT inhaler Generic drug: albuterol Inhale 2 puffs into the lungs every 6 (six) hours as needed for wheezing or shortness of breath. What changed: Another medication with the same name was removed. Continue  taking this medication, and follow the directions you see here.    vitamin B-12 1000 MCG tablet Commonly known as: CYANOCOBALAMIN Take 1,000 mcg by mouth daily.    Relevant Imaging Results:  Relevant Lab Results:   Additional Information SS#: [id number]  Candie Chroman, LCSW

## 2021-07-20 NOTE — Transfer of Care (Signed)
Immediate Anesthesia Transfer of Care Note  Patient: Shawn Jennings  Procedure(s) Performed: Procedure(s): COLONOSCOPY WITH PROPOFOL (N/A)  Patient Location: PACU and Endoscopy Unit  Anesthesia Type:General  Level of Consciousness: sedated  Airway & Oxygen Therapy: Patient Spontanous Breathing and Patient connected to nasal cannula oxygen  Post-op Assessment: Report given to RN and Post -op Vital signs reviewed and stable  Post vital signs: Reviewed and stable  Last Vitals:  Vitals:   07/20/21 0800 07/20/21 1325  BP: (!) 141/59 (!) 90/51  Pulse: (!) 57 64  Resp: (!) 25 (!) 26  Temp: 36.6 C   SpO2: 47% 09%    Complications: No apparent anesthesia complications

## 2021-07-20 NOTE — Op Note (Signed)
[facility] Patient Name: Shawn Jennings Procedure Date: 07/20/2021 12:24 PM MRN: [id number] Account #: [id number] Date of Birth: 1946-05-06 Admit Type: Inpatient Age: 75 Room: [facility] ENDO ROOM 4 Gender: Male Note Status: Finalized Instrument Name: Colonoscope [id number] Procedure:             Colonoscopy Indications:           This is the patient's first colonoscopy, Acute post                         hemorrhagic anemia, Abnormal CT of the GI tract Providers:             Lin Landsman MD, MD Referring MD:          No Local Md, MD (Referring MD) Medicines:             General Anesthesia Complications:         No immediate complications. Estimated blood loss: None. Procedure:             Pre-Anesthesia Assessment:                        - Prior to the procedure, a History and Physical was                         performed, and patient medications and allergies were                         reviewed. The patient is competent. The risks and                         benefits of the procedure and the sedation options and                         risks were discussed with the patient. All questions                         were answered and informed consent was obtained.                         Patient identification and proposed procedure were                         verified by the physician, the nurse, the                         anesthesiologist, the anesthetist and the technician                         in the pre-procedure area in the procedure room in the                         endoscopy suite. Mental Status Examination: alert and                         oriented. Airway Examination: normal oropharyngeal                         airway and neck mobility. Respiratory Examination:  clear to auscultation. CV Examination: normal.                         Prophylactic Antibiotics: The patient does not require                          prophylactic antibiotics. Prior Anticoagulants: The                         patient has taken Eliquis (apixaban), last dose was 3                         days prior to procedure. ASA Grade Assessment: III - A                         patient with severe systemic disease. After reviewing                         the risks and benefits, the patient was deemed in                         satisfactory condition to undergo the procedure. The                         anesthesia plan was to use general anesthesia.                         Immediately prior to administration of medications,                         the patient was re-assessed for adequacy to receive                         sedatives. The heart rate, respiratory rate, oxygen                         saturations, blood pressure, adequacy of pulmonary                         ventilation, and response to care were monitored                         throughout the procedure. The physical status of the                         patient was re-assessed after the procedure.                        After obtaining informed consent, the colonoscope was                         passed under direct vision. Throughout the procedure,                         the patient's blood pressure, pulse, and oxygen                         saturations were monitored continuously. The  Colonoscope was introduced through the anus and                         advanced to the 10 cm into the ileum. The colonoscopy                         was unusually difficult due to inadequate bowel prep                         and significant looping. Successful completion of the                         procedure was aided by applying abdominal pressure and                         lavage. The patient tolerated the procedure well. The                         quality of the bowel preparation was poor. Findings:      The perianal and digital rectal examinations were  normal. Pertinent       negatives include normal sphincter tone and no palpable rectal lesions.      The terminal ileum appeared normal.      Four sessile polyps were found in the descending colon, ascending colon       and cecum. The polyps were 4 to 6 mm in size. These polyps were removed       with a cold snare. Resection and retrieval were complete. Estimated       blood loss: none.      Non-bleeding external hemorrhoids were found during endoscopy. The       hemorrhoids were large.      Normal mucosa was found in the entire colon. Impression:            - Preparation of the colon was poor.                        - The examined portion of the ileum was normal.                        - Four 4 to 6 mm polyps in the descending colon, in                         the ascending colon and in the cecum, removed with a                         cold snare. Resected and retrieved.                        - Non-bleeding external hemorrhoids.                        - Normal mucosa in the entire examined colon. Recommendation:        - Return patient to hospital ward for possible                         discharge same day.                        -  Resume regular diet today.                        - Continue present medications.                        - Resume Eliquis (apixaban) in 2 days at prior dose.                         Refer to managing physician for further adjustment of                         therapy.                        - Await pathology results. Procedure Code(s):     --- Professional ---                        [phone], Colonoscopy, flexible; with removal of                         tumor(s), polyp(s), or other lesion(s) by snare                         technique Diagnosis Code(s):     --- Professional ---                        K64.4, Residual hemorrhoidal skin tags                        K63.5, Polyp of colon                        D62, Acute posthemorrhagic anemia                         R93.3, Abnormal findings on diagnostic imaging of                         other parts of digestive tract CPT copyright 2019 American Medical Association. All rights reserved. The codes documented in this report are preliminary and upon coder review may  be revised to meet current compliance requirements. Dr. Ulyess Mort Lin Landsman MD, MD 07/20/2021 1:17:35 PM This report has been signed electronically. Number of Addenda: 0 Note Initiated On: 07/20/2021 12:24 PM Scope Withdrawal Time: 0 hours 17 minutes 56 seconds  Total Procedure Duration: 0 hours 36 minutes 9 seconds  Estimated Blood Loss:  Estimated blood loss: none.      [facility]

## 2021-07-20 NOTE — Anesthesia Preprocedure Evaluation (Signed)
Anesthesia Evaluation  Patient identified by MRN, date of birth, ID band Patient awake    Reviewed: Allergy & Precautions, NPO status , Patient's Chart, lab work & pertinent test results  Airway Mallampati: III  TM Distance: >3 FB Neck ROM: full    Dental  (+) Chipped, Poor Dentition, Missing, Partial Lower   Pulmonary COPD, Current Smoker and Patient abstained from smoking.,    Pulmonary exam normal        Cardiovascular hypertension, + CAD, + CABG and + Peripheral Vascular Disease  Normal cardiovascular exam     Neuro/Psych PSYCHIATRIC DISORDERS negative neurological ROS     GI/Hepatic Neg liver ROS, GERD  Controlled,  Endo/Other  diabetes, Type 2  Renal/GU Renal disease  negative genitourinary   Musculoskeletal   Abdominal   Peds  Hematology negative hematology ROS (+)   Anesthesia Other Findings Past Medical History: No date: Aortic stenosis     Comment:  a. Pt unaware of history but CT imaging 01/2019               consistent w/ AVR; b. 01/2019 Echo: Mild to mod AS. Mean               grad 81mmHg. No date: CAD (coronary artery disease)     Comment:  a. 1994 s/p CABG ([place]); b. Reports multiple               stress tests over the years w/o repeat cath; c. 01/2019               MV: large, sev, fixed inf and inflat defect extending to               apex. No ischemia. EF 37%-->Med rx given atypical Ss and               pt wishes. No date: CKD (chronic kidney disease), stage III (HCC) No date: COPD (chronic obstructive pulmonary disease) (HCC) No date: Depression No date: Essential hypertension No date: GERD (gastroesophageal reflux disease) No date: Hyperlipidemia LDL goal <70 No date: Ischemic cardiomyopathy     Comment:  a. 01/2019 Echo: EF 40-45%, impaired relaxation. Mildly               dil LA. Sev mitral annular Ca2+. Mild to mod AS. No date: PAD (peripheral artery disease) ([place])     Comment:  a.  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 No date: PAF (paroxysmal atrial fibrillation) (HCC)     Comment:  a. CHA2DS2VASc = 4-->Xarelto 15mg  daily in setting of               CKD. No date: Thyrotoxicosis No date: Tobacco abuse No date: Type II diabetes mellitus ([place])  Past Surgical History: No date: CARDIAC CATHETERIZATION No date: CHOLECYSTECTOMY No date: COLONOSCOPY No date: CORONARY ARTERY BYPASS GRAFT     Comment:  a. [place] 10/19/2020: ENDOSCOPIC RETROGRADE CHOLANGIOPANCREATOGRAPHY (ERCP)  WITH PROPOFOL; N/A     Comment:  Procedure: ENDOSCOPIC RETROGRADE               CHOLANGIOPANCREATOGRAPHY (ERCP) WITH PROPOFOL;  Surgeon:               [name], MD;  Location: [facility] ENDOSCOPY;  Service:               Endoscopy;  Laterality: N/A; 12/29/2020: ERCP; N/A     Comment:  Procedure: ENDOSCOPIC RETROGRADE  CHOLANGIOPANCREATOGRAPHY (ERCP);  Surgeon: [name],               MD;  Location: [facility] ENDOSCOPY;  Service: Endoscopy;                Laterality: N/A; 07/18/2021: ESOPHAGOGASTRODUODENOSCOPY (EGD) WITH PROPOFOL; N/A     Comment:  Procedure: ESOPHAGOGASTRODUODENOSCOPY (EGD) WITH               PROPOFOL;  Surgeon: Lin Landsman, MD;  Location:               [facility] ENDOSCOPY;  Service: Gastroenterology;  Laterality:               N/A; No date: Left AKA 06/06/2020: RIGHT/LEFT HEART CATH AND CORONARY ANGIOGRAPHY; N/A     Comment:  Procedure: RIGHT/LEFT HEART CATH AND CORONARY               ANGIOGRAPHY;  Surgeon: Wellington Hampshire, MD;  Location:               [facility];  Service: Cardiovascular;                Laterality: N/A;  BMI    Body Mass Index: 20.03 kg/m      Reproductive/Obstetrics negative OB ROS                             Anesthesia Physical Anesthesia Plan  ASA: 3  Anesthesia Plan: General   Post-op Pain Management:    Induction: Intravenous  PONV Risk Score and Plan: Propofol infusion and TIVA  Airway  Management Planned: Natural Airway and Nasal Cannula  Additional Equipment:   Intra-op Plan:   Post-operative Plan:   Informed Consent: I have reviewed the patients History and Physical, chart, labs and discussed the procedure including the risks, benefits and alternatives for the proposed anesthesia with the patient or authorized representative who has indicated his/her understanding and acceptance.   Patient has DNR.  Discussed DNR with patient and Suspend DNR.   Dental Advisory Given  Plan Discussed with: Anesthesiologist, CRNA and Surgeon  Anesthesia Plan Comments: (Patient consented for risks of anesthesia including but not limited to:  - adverse reactions to medications - risk of airway placement if required - damage to eyes, teeth, lips or other oral mucosa - nerve damage due to positioning  - sore throat or hoarseness - Damage to heart, brain, nerves, lungs, other parts of body or loss of life  Patient voiced understanding.)        Anesthesia Quick Evaluation

## 2021-07-20 NOTE — Discharge Summary (Signed)
[facility] NAME: Shawn Jennings    MR#:  [id number]  DATE OF BIRTH:  [birth year]  DATE OF ADMISSION:  07/15/2021 ADMITTING PHYSICIAN: Amy N Cox, DO  DATE OF DISCHARGE: 07/20/2021  PRIMARY CARE PHYSICIAN: [facility]    ADMISSION DIAGNOSIS:  Shortness of breath [R06.02] GI bleed [K92.2] Upper GI bleed [K92.2] Abdominal pain [R10.9]  DISCHARGE DIAGNOSIS:  Principal Problem:   GI bleed Active Problems:   Chronic kidney disease, stage 3a (HCC)   Essential hypertension   Coronary artery disease of native artery of native heart with stable angina pectoris (HCC)   Protein-calorie malnutrition, severe   AF (paroxysmal atrial fibrillation) (HCC)   On anticoagulant therapy   Depression   GERD (gastroesophageal reflux disease)   Tobacco abuse   Hyperlipidemia   Abdominal pain   Acute blood loss anemia   Hypokalemia   Type 2 diabetes mellitus with stage 3a chronic kidney disease, without long-term current use of insulin ([place])   SECONDARY DIAGNOSIS:   Past Medical History:  Diagnosis Date   Aortic stenosis    a. Pt unaware of history but CT imaging 01/2019 consistent w/ AVR; b. 01/2019 Echo: Mild to mod AS. Mean grad 36mmHg.   CAD (coronary artery disease)    a. 1994 s/p CABG ([place]); b. Reports multiple stress tests over the years w/o repeat cath; c. 01/2019 MV: large, sev, fixed inf and inflat defect extending to apex. No ischemia. EF 37%-->Med rx given atypical Ss and pt wishes.   CKD (chronic kidney disease), stage III (HCC)    COPD (chronic obstructive pulmonary disease) (HCC)    Depression    Essential hypertension    GERD (gastroesophageal reflux disease)    Hyperlipidemia LDL goal <70    Ischemic cardiomyopathy    a. 01/2019 Echo: EF 40-45%, impaired relaxation. Mildly dil LA. Sev mitral annular Ca2+. Mild to mod AS.   PAD (peripheral artery disease) ([place])    a. 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   PAF (paroxysmal  atrial fibrillation) (HCC)    a. CHA2DS2VASc = 4-->Xarelto 15mg  daily in setting of CKD.   Thyrotoxicosis    Tobacco abuse    Type II diabetes mellitus ([place])     HOSPITAL COURSE:   Acute blood loss anemia with GI bleed.  The patient received 2 units of packed red blood cells during the hospital course and hemoglobin stabilized.  Hemoglobin at its lowest point was 7.1.  Hemoglobin upon discharge 9.2.  EGD showed erosive gastropathy with no acute bleeding.  Colonoscopy showed nonbleeding hemorrhoids.  4 polyps were removed.  Poor prep.  Gastroenterology cleared to get out of the hospital and to go back on Eliquis in 2 days (October 1).  Recommend checking a CBC in 1 week. Hypotension on midodrine.  Discontinued Entresto.  Drs. Making [facility] can reassess on whether Lasix needed. Acute kidney injury on chronic kidney disease stage IIIa.  Creatinine 1.65 on presentation and improved down to 1.35.  This is an improvement of 0.3. Hypokalemia replaced during the hospital course Depression on Cymbalta Type 2 diabetes mellitus with chronic kidney disease stage IIIa.  Hemoglobin A1c 6.5 well-controlled.  Can go back on Farxiga as outpatient. History of CAD.  Holding aspirin, Coreg, Imdur and Entresto with hypotension and bleeding.  Patient on midodrine to lift up blood pressure. Paroxysmal atrial fibrillation.  Holding Coreg and gastroenterology did cleared to go back on Eliquis in 2 days on October 1. Peripheral  vascular disease status post left AKA in the past  DISCHARGE CONDITIONS:   Satisfactory  CONSULTS OBTAINED:  Treatment Team:  [name], MD  DRUG ALLERGIES:  No Known Allergies  DISCHARGE MEDICATIONS:   Allergies as of 07/20/2021   No Known Allergies      Medication List     STOP taking these medications    aspirin EC 81 MG tablet   carvedilol 3.125 MG tablet Commonly known as: COREG   Entresto 24-26 MG Generic drug: sacubitril-valsartan   ferrous sulfate 325 (65  FE) MG tablet   furosemide 40 MG tablet Commonly known as: LASIX   lactulose 10 GM/15ML solution Commonly known as: CHRONULAC   lidocaine 5 % Commonly known as: LIDODERM   liver oil-zinc oxide 40 % ointment Commonly known as: DESITIN   loperamide 2 MG capsule Commonly known as: IMODIUM   LORazepam 0.5 MG tablet Commonly known as: ATIVAN   metFORMIN 1000 MG tablet Commonly known as: GLUCOPHAGE   potassium chloride SA 20 MEQ tablet Commonly known as: KLOR-CON   predniSONE 20 MG tablet Commonly known as: DELTASONE   Rivaroxaban 15 MG Tabs tablet Commonly known as: XARELTO   senna 8.6 MG tablet Commonly known as: SENOKOT   sodium bicarbonate 650 MG tablet   tamsulosin 0.4 MG Caps capsule Commonly known as: FLOMAX   traZODone 50 MG tablet Commonly known as: DESYREL   triamcinolone cream 0.1 % Commonly known as: KENALOG       TAKE these medications    acetaminophen 500 MG tablet Commonly known as: TYLENOL Take 500 mg by mouth every 6 (six) hours as needed.   apixaban 5 MG Tabs tablet Commonly known as: ELIQUIS Take 1 tablet (5 mg total) by mouth 2 (two) times daily. Start taking on: July 22, 2021   atorvastatin 80 MG tablet Commonly known as: LIPITOR Take 80 mg by mouth at bedtime.   cholecalciferol 25 MCG (1000 UNIT) tablet Commonly known as: VITAMIN D3 Take 1,000 Units by mouth every evening.   dapagliflozin propanediol 5 MG Tabs tablet Commonly known as: FARXIGA Take 5 mg by mouth daily.   DULoxetine 30 MG capsule Commonly known as: CYMBALTA Take 30 mg by mouth 2 (two) times daily.   Fluticasone-Salmeterol 250-50 MCG/DOSE Aepb Commonly known as: ADVAIR Inhale 1 puff into the lungs 2 (two) times daily.   hydrOXYzine 25 MG tablet Commonly known as: ATARAX/VISTARIL Take 25 mg by mouth every 6 (six) hours as needed (allergy symptoms).   isosorbide mononitrate 30 MG 24 hr tablet Commonly known as: IMDUR Take 1 tablet (30 mg total) by  mouth daily.   ketotifen 0.025 % ophthalmic solution Commonly known as: ZADITOR Place 1 drop into both eyes 2 (two) times daily as needed.   lamoTRIgine 25 MG tablet Commonly known as: LAMICTAL Take 25 mg by mouth 2 (two) times daily.   loratadine 10 MG tablet Commonly known as: CLARITIN Take 10 mg by mouth daily.   melatonin 3 MG Tabs tablet Take 1 tablet by mouth at bedtime.   midodrine 5 MG tablet Commonly known as: PROAMATINE Take 1 tablet (5 mg total) by mouth 3 (three) times daily with meals.   montelukast 10 MG tablet Commonly known as: SINGULAIR Take 10 mg by mouth at bedtime.   nicotine 14 mg/24hr patch Commonly known as: NICODERM CQ - dosed in mg/24 hours One patch chest wall daily (okay to substitute generic)   nitroGLYCERIN 0.4 MG SL tablet Commonly known as: NITROSTAT Place 0.4  mg under the tongue every 5 (five) minutes as needed for chest pain.   pantoprazole 40 MG tablet Commonly known as: PROTONIX Take 40 mg by mouth daily.   PreserVision AREDS Caps Take 1 capsule by mouth in the morning and at bedtime.   Ventolin HFA 108 (90 Base) MCG/ACT inhaler Generic drug: albuterol Inhale 2 puffs into the lungs every 6 (six) hours as needed for wheezing or shortness of breath. What changed: Another medication with the same name was removed. Continue taking this medication, and follow the directions you see here.   vitamin B-12 1000 MCG tablet Commonly known as: CYANOCOBALAMIN Take 1,000 mcg by mouth daily.         DISCHARGE INSTRUCTIONS:   Follow-up with PMD 5 days Follow-up cardiology 2 weeks  If you experience worsening of your admission symptoms, develop shortness of breath, life threatening emergency, suicidal or homicidal thoughts you must seek medical attention immediately by calling 911 or calling your MD immediately  if symptoms less severe.  You Must read complete instructions/literature along with all the possible adverse reactions/side  effects for all the Medicines you take and that have been prescribed to you. Take any new Medicines after you have completely understood and accept all the possible adverse reactions/side effects.   Please note  You were cared for by a hospitalist during your hospital stay. If you have any questions about your discharge medications or the care you received while you were in the hospital after you are discharged, you can call the unit and asked to speak with the hospitalist on call if the hospitalist that took care of you is not available. Once you are discharged, your primary care physician will handle any further medical issues. Please note that NO REFILLS for any discharge medications will be authorized once you are discharged, as it is imperative that you return to your primary care physician (or establish a relationship with a primary care physician if you do not have one) for your aftercare needs so that they can reassess your need for medications and monitor your lab values.    Today   CHIEF COMPLAINT:   Chief Complaint  Patient presents with   Emesis    HISTORY OF PRESENT ILLNESS:  Shawn Jennings  is a 75 y.o. male came in with vomiting and black stool   VITAL SIGNS:  Blood pressure 140/70, pulse 67, temperature 97.6 F (36.4 C), resp. rate 19, weight 67 kg, SpO2 95 %.  I/O:   Intake/Output Summary (Last 24 hours) at 07/20/2021 1632 Last data filed at 07/20/2021 3875 Gross per 24 hour  Intake 847.18 ml  Output 1230 ml  Net -382.82 ml    PHYSICAL EXAMINATION:  GENERAL:  75 y.o.-year-old patient lying in the bed with no acute distress.  EYES: Pupils equal, round, reactive to light and accommodation. No scleral icterus. HEENT: Head atraumatic, normocephalic. Oropharynx and nasopharynx clear.  LUNGS: Normal breath sounds bilaterally, no wheezing, rales,rhonchi or crepitation. No use of accessory muscles of respiration.  CARDIOVASCULAR: S1, S2 normal. No murmurs, rubs, or  gallops.  ABDOMEN: Soft, non-tender, non-distended.  EXTREMITIES: No right leg pedal edema.  NEUROLOGIC: Cranial nerves II through XII are intact. PSYCHIATRIC: The patient is alert and answers questions appropriately.  SKIN: No obvious rash, lesion, or ulcer.   DATA REVIEW:   CBC Recent Labs  Lab 07/20/21 0445  WBC 13.6*  HGB 9.2*  HCT 27.7*  PLT 187    Chemistries  Recent Labs  Lab 07/16/21  3893 07/17/21 0903 07/20/21 0445  NA 142   < > 141  K 3.7   < > 3.5  CL 114*   < > 112*  CO2 19*   < > 22  GLUCOSE 122*   < > 162*  BUN 35*   < > 11  CREATININE 1.48*   < > 1.35*  CALCIUM 7.7*   < > 8.2*  MG 1.7  --  1.8  AST 10*  --   --   ALT 8  --   --   ALKPHOS 52  --   --   BILITOT 0.7  --   --    < > = values in this interval not displayed.    Microbiology Results  Results for orders placed or performed during the hospital encounter of 07/15/21  Resp Panel by RT-PCR (Flu A&B, Covid) Nasopharyngeal Swab     Status: None   Collection Time: 07/15/21  2:46 PM   Specimen: Nasopharyngeal Swab; Nasopharyngeal(NP) swabs in vial transport medium  Result Value Ref Range Status   SARS Coronavirus 2 by RT PCR NEGATIVE NEGATIVE Final    Comment: (NOTE) SARS-CoV-2 target nucleic acids are NOT DETECTED.  The SARS-CoV-2 RNA is generally detectable in upper respiratory specimens during the acute phase of infection. The lowest concentration of SARS-CoV-2 viral copies this assay can detect is 138 copies/mL. A negative result does not preclude SARS-Cov-2 infection and should not be used as the sole basis for treatment or other patient management decisions. A negative result may occur with  improper specimen collection/handling, submission of specimen other than nasopharyngeal swab, presence of viral mutation(s) within the areas targeted by this assay, and inadequate number of viral copies(<138 copies/mL). A negative result must be combined with clinical observations, patient  history, and epidemiological information. The expected result is Negative.  Fact Sheet for Patients:  EntrepreneurPulse.com.au  Fact Sheet for Healthcare Providers:  IncredibleEmployment.be  This test is no t yet approved or cleared by the [place] FDA and  has been authorized for detection and/or diagnosis of SARS-CoV-2 by FDA under an Emergency Use Authorization (EUA). This EUA will remain  in effect (meaning this test can be used) for the duration of the COVID-19 declaration under Section 564(b)(1) of the Act, 21 U.S.C.section 360bbb-3(b)(1), unless the authorization is terminated  or revoked sooner.       Influenza A by PCR NEGATIVE NEGATIVE Final   Influenza B by PCR NEGATIVE NEGATIVE Final    Comment: (NOTE) The Xpert Xpress SARS-CoV-2/FLU/RSV plus assay is intended as an aid in the diagnosis of influenza from Nasopharyngeal swab specimens and should not be used as a sole basis for treatment. Nasal washings and aspirates are unacceptable for Xpert Xpress SARS-CoV-2/FLU/RSV testing.  Fact Sheet for Patients: EntrepreneurPulse.com.au  Fact Sheet for Healthcare Providers: IncredibleEmployment.be  This test is not yet approved or cleared by the [place] FDA and has been authorized for detection and/or diagnosis of SARS-CoV-2 by FDA under an Emergency Use Authorization (EUA). This EUA will remain in effect (meaning this test can be used) for the duration of the COVID-19 declaration under Section 564(b)(1) of the Act, 21 U.S.C. section 360bbb-3(b)(1), unless the authorization is terminated or revoked.  Performed at [facility], [address] [place], [place] [address]   MRSA Next Gen by PCR, Nasal     Status: Abnormal   Collection Time: 07/16/21 12:25 PM   Specimen: Nasal Mucosa; Nasal Swab  Result Value Ref Range Status  MRSA by PCR Next Gen DETECTED (A) NOT DETECTED Final     Comment: RESULT CALLED TO, READ BACK BY AND VERIFIED WITH: Burnetta Sabin 07/16/21 1415 AMK (NOTE) The GeneXpert MRSA Assay (FDA approved for NASAL specimens only), is one component of a comprehensive MRSA colonization surveillance program. It is not intended to diagnose MRSA infection nor to guide or monitor treatment for MRSA infections. Test performance is not FDA approved in patients less than 10 years old. Performed at [facility], [address] [place], [place] [address]       Management plans discussed with the patient, and he is in agreement.  CODE STATUS:     Code Status Orders  (From admission, onward)           Start     Ordered   07/15/21 1507  Limited resuscitation (code)  Continuous       Question Answer Comment  In the event of cardiac or respiratory ARREST: Initiate Code Blue, Call Rapid Response No   In the event of cardiac or respiratory ARREST: Perform CPR No   In the event of cardiac or respiratory ARREST: Perform Intubation/Mechanical Ventilation Yes   In the event of cardiac or respiratory ARREST: Use NIPPV/BiPAp only if indicated Yes   In the event of cardiac or respiratory ARREST: Administer ACLS medications if indicated No   In the event of cardiac or respiratory ARREST: Perform Defibrillation or Cardioversion if indicated No      07/15/21 1507           Code Status History     Date Active Date Inactive Code Status Order ID Comments User Context   07/15/2021 1414 07/15/2021 1507 Full Code [id number]  CoxBriant Cedar, DO ED   10/17/2020 2151 10/25/2020 2246 Full Code [id number]  Mansy, Arvella Merles, MD ED   06/06/2020 1304 06/06/2020 2019 Full Code [id number]  Wellington Hampshire, MD Inpatient   03/09/2020 1753 03/12/2020 0754 Full Code [id number]  Sharen Hones, MD ED   01/26/2019 1439 01/28/2019 1634 Full Code [id number]  Saundra Shelling, MD ED      Advance Directive Documentation    Flowsheet Row Most Recent Value  Type of Advance Directive --   [PARTIAL]  Pre-existing out of facility DNR order (yellow form or pink MOST form) --  "MOST" Form in Place? --       TOTAL TIME TAKING CARE OF THIS PATIENT: 35 minutes.    Loletha Grayer M.D on 07/20/2021 at 4:32 PM   [facility]  CC: Primary care physician; [facility]

## 2021-07-20 NOTE — Progress Notes (Signed)
EMS transported patient to ALF.

## 2021-07-20 NOTE — TOC Progression Note (Signed)
Transition of Care [facility]) - Progression Note    Patient Details  Name: Shawn Jennings MRN: [id number] Date of Birth: [birth year]  [facility] CM/SW Contact  Beverly Sessions, RN Phone Number: 07/20/2021, 2:29 PM  Clinical Narrative:        Patient transferred from ICU Patient had coloscopy today   PT and OT recommending home health.  Patient agreeable states he does not have a preference.  VM left for Jasmine at [place] to determine if they have a preferred agency      Expected Discharge Plan and Services                                                 Social Determinants of Health (SDOH) Interventions    Readmission Risk Interventions No flowsheet data found.

## 2021-07-21 ENCOUNTER — Encounter: Payer: Self-pay | Admitting: Gastroenterology

## 2021-07-21 LAB — SURGICAL PATHOLOGY

## 2021-07-21 NOTE — Anesthesia Postprocedure Evaluation (Signed)
Anesthesia Post Note  Patient: Shawn Jennings  Procedure(s) Performed: COLONOSCOPY WITH PROPOFOL  Patient location during evaluation: Endoscopy Anesthesia Type: General Level of consciousness: awake and alert Pain management: pain level controlled Vital Signs Assessment: post-procedure vital signs reviewed and stable Respiratory status: spontaneous breathing, nonlabored ventilation, respiratory function stable and patient connected to nasal cannula oxygen Cardiovascular status: blood pressure returned to baseline and stable Postop Assessment: no apparent nausea or vomiting Anesthetic complications: no   No notable events documented.   Last Vitals:  Vitals:   07/20/21 1500 07/20/21 1640  BP: 140/70 138/77  Pulse: 67 71  Resp:  18  Temp: 36.4 C 36.6 C  SpO2: 95% 92%    Last Pain:  Vitals:   07/20/21 1640  TempSrc: Oral  PainSc:                  Precious Haws Tiffine Henigan

## 2021-08-03 ENCOUNTER — Other Ambulatory Visit: Payer: Self-pay

## 2021-08-03 ENCOUNTER — Inpatient Hospital Stay
Admission: EM | Admit: 2021-08-03 | Discharge: 2021-08-05 | DRG: 291 | Disposition: A | Discharge status: Home Health | Payer: Medicare Other | Source: Skilled Nursing Facility | Attending: Obstetrics and Gynecology | Admitting: Obstetrics and Gynecology

## 2021-08-03 ENCOUNTER — Emergency Department: Payer: Medicare Other

## 2021-08-03 ENCOUNTER — Encounter: Payer: Self-pay | Admitting: Internal Medicine

## 2021-08-03 ENCOUNTER — Inpatient Hospital Stay (HOSPITAL_COMMUNITY)
Admit: 2021-08-03 | Discharge: 2021-08-03 | Disposition: A | Discharge status: Home / Self Care | Payer: Medicare Other | Attending: Internal Medicine | Admitting: Internal Medicine

## 2021-08-03 DIAGNOSIS — Z89612 Acquired absence of left leg above knee: Secondary | ICD-10-CM | POA: Diagnosis not present

## 2021-08-03 DIAGNOSIS — I25118 Atherosclerotic heart disease of native coronary artery with other forms of angina pectoris: Secondary | ICD-10-CM | POA: Diagnosis present

## 2021-08-03 DIAGNOSIS — F17210 Nicotine dependence, cigarettes, uncomplicated: Secondary | ICD-10-CM | POA: Diagnosis present

## 2021-08-03 DIAGNOSIS — Z716 Tobacco abuse counseling: Secondary | ICD-10-CM | POA: Diagnosis not present

## 2021-08-03 DIAGNOSIS — Z7982 Long term (current) use of aspirin: Secondary | ICD-10-CM

## 2021-08-03 DIAGNOSIS — I493 Ventricular premature depolarization: Secondary | ICD-10-CM | POA: Diagnosis present

## 2021-08-03 DIAGNOSIS — Z7901 Long term (current) use of anticoagulants: Secondary | ICD-10-CM

## 2021-08-03 DIAGNOSIS — N1831 Chronic kidney disease, stage 3a: Secondary | ICD-10-CM | POA: Diagnosis present

## 2021-08-03 DIAGNOSIS — N400 Enlarged prostate without lower urinary tract symptoms: Secondary | ICD-10-CM | POA: Diagnosis present

## 2021-08-03 DIAGNOSIS — I5033 Acute on chronic diastolic (congestive) heart failure: Secondary | ICD-10-CM | POA: Diagnosis present

## 2021-08-03 DIAGNOSIS — U071 COVID-19: Secondary | ICD-10-CM | POA: Diagnosis present

## 2021-08-03 DIAGNOSIS — E1122 Type 2 diabetes mellitus with diabetic chronic kidney disease: Secondary | ICD-10-CM | POA: Diagnosis present

## 2021-08-03 DIAGNOSIS — E785 Hyperlipidemia, unspecified: Secondary | ICD-10-CM | POA: Diagnosis present

## 2021-08-03 DIAGNOSIS — J449 Chronic obstructive pulmonary disease, unspecified: Secondary | ICD-10-CM | POA: Diagnosis present

## 2021-08-03 DIAGNOSIS — F319 Bipolar disorder, unspecified: Secondary | ICD-10-CM | POA: Diagnosis present

## 2021-08-03 DIAGNOSIS — Z720 Tobacco use: Secondary | ICD-10-CM | POA: Diagnosis present

## 2021-08-03 DIAGNOSIS — I5031 Acute diastolic (congestive) heart failure: Secondary | ICD-10-CM | POA: Diagnosis not present

## 2021-08-03 DIAGNOSIS — K219 Gastro-esophageal reflux disease without esophagitis: Secondary | ICD-10-CM | POA: Diagnosis present

## 2021-08-03 DIAGNOSIS — Z952 Presence of prosthetic heart valve: Secondary | ICD-10-CM | POA: Diagnosis not present

## 2021-08-03 DIAGNOSIS — I509 Heart failure, unspecified: Secondary | ICD-10-CM | POA: Insufficient documentation

## 2021-08-03 DIAGNOSIS — I130 Hypertensive heart and chronic kidney disease with heart failure and stage 1 through stage 4 chronic kidney disease, or unspecified chronic kidney disease: Secondary | ICD-10-CM | POA: Diagnosis present

## 2021-08-03 DIAGNOSIS — I480 Paroxysmal atrial fibrillation: Secondary | ICD-10-CM | POA: Diagnosis present

## 2021-08-03 DIAGNOSIS — Z8249 Family history of ischemic heart disease and other diseases of the circulatory system: Secondary | ICD-10-CM

## 2021-08-03 DIAGNOSIS — Z9049 Acquired absence of other specified parts of digestive tract: Secondary | ICD-10-CM

## 2021-08-03 DIAGNOSIS — F32A Depression, unspecified: Secondary | ICD-10-CM | POA: Diagnosis present

## 2021-08-03 DIAGNOSIS — Z951 Presence of aortocoronary bypass graft: Secondary | ICD-10-CM | POA: Diagnosis not present

## 2021-08-03 DIAGNOSIS — E1151 Type 2 diabetes mellitus with diabetic peripheral angiopathy without gangrene: Secondary | ICD-10-CM | POA: Diagnosis present

## 2021-08-03 DIAGNOSIS — Z79899 Other long term (current) drug therapy: Secondary | ICD-10-CM

## 2021-08-03 DIAGNOSIS — I255 Ischemic cardiomyopathy: Secondary | ICD-10-CM | POA: Diagnosis present

## 2021-08-03 DIAGNOSIS — Z7951 Long term (current) use of inhaled steroids: Secondary | ICD-10-CM

## 2021-08-03 LAB — CBC WITH DIFFERENTIAL/PLATELET
Abs Immature Granulocytes: 0.03 10*3/uL (ref 0.00–0.07)
Basophils Absolute: 0.1 10*3/uL (ref 0.0–0.1)
Basophils Relative: 1 %
Eosinophils Absolute: 0.3 10*3/uL (ref 0.0–0.5)
Eosinophils Relative: 4 %
HCT: 29.3 % — ABNORMAL LOW (ref 39.0–52.0)
Hemoglobin: 9.4 g/dL — ABNORMAL LOW (ref 13.0–17.0)
Immature Granulocytes: 0 %
Lymphocytes Relative: 22 %
Lymphs Abs: 1.7 10*3/uL (ref 0.7–4.0)
MCH: 28.2 pg (ref 26.0–34.0)
MCHC: 32.1 g/dL (ref 30.0–36.0)
MCV: 88 fL (ref 80.0–100.0)
Monocytes Absolute: 0.6 10*3/uL (ref 0.1–1.0)
Monocytes Relative: 8 %
Neutro Abs: 5.1 10*3/uL (ref 1.7–7.7)
Neutrophils Relative %: 65 %
Platelets: 239 10*3/uL (ref 150–400)
RBC: 3.33 MIL/uL — ABNORMAL LOW (ref 4.22–5.81)
RDW: 16.6 % — ABNORMAL HIGH (ref 11.5–15.5)
WBC: 7.9 10*3/uL (ref 4.0–10.5)
nRBC: 0 % (ref 0.0–0.2)

## 2021-08-03 LAB — COMPREHENSIVE METABOLIC PANEL
ALT: 10 U/L (ref 0–44)
AST: 12 U/L — ABNORMAL LOW (ref 15–41)
Albumin: 3.2 g/dL — ABNORMAL LOW (ref 3.5–5.0)
Alkaline Phosphatase: 73 U/L (ref 38–126)
Anion gap: 8 (ref 5–15)
BUN: 12 mg/dL (ref 8–23)
CO2: 22 mmol/L (ref 22–32)
Calcium: 8.4 mg/dL — ABNORMAL LOW (ref 8.9–10.3)
Chloride: 112 mmol/L — ABNORMAL HIGH (ref 98–111)
Creatinine, Ser: 1.22 mg/dL (ref 0.61–1.24)
GFR, Estimated: >60 mL/min (ref >60)
Glucose, Bld: 110 mg/dL — ABNORMAL HIGH (ref 70–99)
Potassium: 3.6 mmol/L (ref 3.5–5.1)
Sodium: 142 mmol/L (ref 135–145)
Total Bilirubin: 0.9 mg/dL (ref 0.3–1.2)
Total Protein: 6.4 g/dL — ABNORMAL LOW (ref 6.5–8.1)

## 2021-08-03 LAB — GLUCOSE, CAPILLARY: Glucose-Capillary: 103 mg/dL — ABNORMAL HIGH (ref 70–99)

## 2021-08-03 LAB — LIPASE, BLOOD: Lipase: 98 U/L — ABNORMAL HIGH (ref 11–51)

## 2021-08-03 LAB — ECHOCARDIOGRAM COMPLETE
AR max vel: 1.54 cm2
AV Area VTI: 1.82 cm2
AV Area mean vel: 1.65 cm2
AV Mean grad: 6 mmHg
AV Peak grad: 11.4 mmHg
Ao pk vel: 1.69 m/s
Area-P 1/2: 6.48 cm2
Calc EF: 33.3 %
Height: 72 in
MV VTI: 1.75 cm2
S' Lateral: 5.3 cm
Single Plane A2C EF: 34.1 %
Single Plane A4C EF: 36.5 %
Weight: 2286.4 oz

## 2021-08-03 LAB — D-DIMER, QUANTITATIVE: D-Dimer, Quant: 2.46 ug/mL-FEU — ABNORMAL HIGH (ref 0.00–0.50)

## 2021-08-03 LAB — CBG MONITORING, ED: Glucose-Capillary: 108 mg/dL — ABNORMAL HIGH (ref 70–99)

## 2021-08-03 LAB — TROPONIN I (HIGH SENSITIVITY)
Troponin I (High Sensitivity): 22 ng/L — ABNORMAL HIGH (ref <18)
Troponin I (High Sensitivity): 20 ng/L — ABNORMAL HIGH (ref <18)
Troponin I (High Sensitivity): 20 ng/L — ABNORMAL HIGH (ref <18)

## 2021-08-03 LAB — RESP PANEL BY RT-PCR (FLU A&B, COVID) ARPGX2
Influenza A by PCR: NEGATIVE
Influenza B by PCR: NEGATIVE
SARS Coronavirus 2 by RT PCR: POSITIVE — AB

## 2021-08-03 LAB — BRAIN NATRIURETIC PEPTIDE: B Natriuretic Peptide: 1688.3 pg/mL — ABNORMAL HIGH (ref 0.0–100.0)

## 2021-08-03 LAB — PROCALCITONIN: Procalcitonin: <0.1 ng/mL

## 2021-08-03 MED ORDER — ONDANSETRON HCL 4 MG/2ML IJ SOLN: 4.0000 mg | Freq: Four times a day (QID) | INTRAMUSCULAR | Status: DC | PRN

## 2021-08-03 MED ORDER — SODIUM CHLORIDE 0.9 % IV SOLN
200.0000 mg | Freq: Once | INTRAVENOUS | Status: AC
  Administered 2021-08-03: 200 mg via INTRAVENOUS
  Filled 2021-08-03: qty 200

## 2021-08-03 MED ORDER — NICOTINE 7 MG/24HR TD PT24
7.0000 mg | MEDICATED_PATCH | Freq: Every day | TRANSDERMAL | Status: DC | PRN
Start: 2021-08-03 — End: 2021-08-05
  Filled 2021-08-03: qty 1

## 2021-08-03 MED ORDER — SODIUM CHLORIDE 0.9 % IV SOLN: 250.0000 mL | INTRAVENOUS | Status: DC | PRN

## 2021-08-03 MED ORDER — SODIUM CHLORIDE 0.9 % IV SOLN
100.0000 mg | Freq: Every day | INTRAVENOUS | Status: DC
  Administered 2021-08-04: 100 mg via INTRAVENOUS
  Filled 2021-08-03: qty 100

## 2021-08-03 MED ORDER — SODIUM CHLORIDE 0.9% FLUSH: 3.0000 mL | INTRAVENOUS | Status: DC | PRN

## 2021-08-03 MED ORDER — ISOSORBIDE MONONITRATE ER 30 MG PO TB24
30.0000 mg | ORAL_TABLET | Freq: Every day | ORAL | Status: DC
  Administered 2021-08-03 – 2021-08-05 (×3): 30 mg via ORAL
  Filled 2021-08-03 (×3): qty 1

## 2021-08-03 MED ORDER — LORATADINE 10 MG PO TABS
10.0000 mg | ORAL_TABLET | Freq: Every day | ORAL | Status: DC
  Administered 2021-08-03 – 2021-08-05 (×3): 10 mg via ORAL
  Filled 2021-08-03 (×3): qty 1

## 2021-08-03 MED ORDER — SACUBITRIL-VALSARTAN 24-26 MG PO TABS
1.0000 | ORAL_TABLET | Freq: Two times a day (BID) | ORAL | Status: DC
  Administered 2021-08-03 – 2021-08-05 (×5): 1 via ORAL
  Filled 2021-08-03 (×6): qty 1

## 2021-08-03 MED ORDER — APIXABAN 5 MG PO TABS
5.0000 mg | ORAL_TABLET | Freq: Two times a day (BID) | ORAL | Status: DC
  Administered 2021-08-03 – 2021-08-05 (×5): 5 mg via ORAL
  Filled 2021-08-03 (×5): qty 1

## 2021-08-03 MED ORDER — MORPHINE SULFATE (PF) 2 MG/ML IV SOLN
2.0000 mg | Freq: Once | INTRAVENOUS | Status: AC
  Administered 2021-08-03: 2 mg via INTRAVENOUS
  Filled 2021-08-03: qty 1

## 2021-08-03 MED ORDER — HYDROXYZINE HCL 25 MG PO TABS
25.0000 mg | ORAL_TABLET | Freq: Four times a day (QID) | ORAL | Status: DC | PRN
  Administered 2021-08-03: 25 mg via ORAL
  Filled 2021-08-03: qty 1

## 2021-08-03 MED ORDER — GUAIFENESIN-DM 100-10 MG/5ML PO SYRP
10.0000 mL | ORAL_SOLUTION | ORAL | Status: DC | PRN
  Administered 2021-08-03: 10 mL via ORAL
  Filled 2021-08-03: qty 10

## 2021-08-03 MED ORDER — DAPAGLIFLOZIN PROPANEDIOL 5 MG PO TABS
5.0000 mg | ORAL_TABLET | Freq: Every day | ORAL | Status: DC
  Administered 2021-08-03 – 2021-08-04 (×2): 5 mg via ORAL
  Filled 2021-08-03 (×2): qty 1

## 2021-08-03 MED ORDER — MONTELUKAST SODIUM 10 MG PO TABS
10.0000 mg | ORAL_TABLET | Freq: Every day | ORAL | Status: DC
  Administered 2021-08-03 – 2021-08-04 (×2): 10 mg via ORAL
  Filled 2021-08-03 (×2): qty 1

## 2021-08-03 MED ORDER — LAMOTRIGINE 25 MG PO TABS
25.0000 mg | ORAL_TABLET | Freq: Two times a day (BID) | ORAL | Status: DC
  Administered 2021-08-03 – 2021-08-05 (×4): 25 mg via ORAL
  Filled 2021-08-03 (×7): qty 1

## 2021-08-03 MED ORDER — ASPIRIN EC 81 MG PO TBEC
81.0000 mg | DELAYED_RELEASE_TABLET | Freq: Every day | ORAL | Status: DC
  Administered 2021-08-03 – 2021-08-05 (×3): 81 mg via ORAL
  Filled 2021-08-03 (×3): qty 1

## 2021-08-03 MED ORDER — NITROGLYCERIN 0.4 MG SL SUBL
0.4000 mg | SUBLINGUAL_TABLET | SUBLINGUAL | Status: DC | PRN
  Administered 2021-08-03: 0.4 mg via SUBLINGUAL
  Filled 2021-08-03: qty 1

## 2021-08-03 MED ORDER — NITROGLYCERIN 0.4 MG SL SUBL: 0.4000 mg | SUBLINGUAL_TABLET | SUBLINGUAL | Status: DC | PRN

## 2021-08-03 MED ORDER — ACETAMINOPHEN 325 MG PO TABS
650.0000 mg | ORAL_TABLET | ORAL | Status: DC | PRN
  Administered 2021-08-03: 650 mg via ORAL
  Filled 2021-08-03 (×2): qty 2

## 2021-08-03 MED ORDER — ASCORBIC ACID 500 MG PO TABS
500.0000 mg | ORAL_TABLET | Freq: Every day | ORAL | Status: DC
  Administered 2021-08-03 – 2021-08-05 (×3): 500 mg via ORAL
  Filled 2021-08-03 (×3): qty 1

## 2021-08-03 MED ORDER — DULOXETINE HCL 30 MG PO CPEP
30.0000 mg | ORAL_CAPSULE | Freq: Two times a day (BID) | ORAL | Status: DC
  Administered 2021-08-03 – 2021-08-05 (×5): 30 mg via ORAL
  Filled 2021-08-03 (×6): qty 1

## 2021-08-03 MED ORDER — ZINC SULFATE 220 (50 ZN) MG PO CAPS
220.0000 mg | ORAL_CAPSULE | Freq: Every day | ORAL | Status: DC
  Administered 2021-08-03 – 2021-08-05 (×3): 220 mg via ORAL
  Filled 2021-08-03 (×3): qty 1

## 2021-08-03 MED ORDER — SODIUM CHLORIDE 0.9% FLUSH: 3.0000 mL | Freq: Two times a day (BID) | INTRAVENOUS | Status: DC

## 2021-08-03 MED ORDER — OCUVITE-LUTEIN PO CAPS
1.0000 | ORAL_CAPSULE | Freq: Two times a day (BID) | ORAL | Status: DC
  Administered 2021-08-03 – 2021-08-05 (×5): 1 via ORAL
  Filled 2021-08-03 (×6): qty 1

## 2021-08-03 MED ORDER — SODIUM CHLORIDE 0.9% FLUSH
3.0000 mL | Freq: Two times a day (BID) | INTRAVENOUS | Status: DC
  Administered 2021-08-03 – 2021-08-05 (×4): 3 mL via INTRAVENOUS

## 2021-08-03 MED ORDER — SENNA 8.6 MG PO TABS
1.0000 | ORAL_TABLET | Freq: Every day | ORAL | Status: DC
  Administered 2021-08-03 – 2021-08-05 (×3): 8.6 mg via ORAL
  Filled 2021-08-03 (×3): qty 1

## 2021-08-03 MED ORDER — PANTOPRAZOLE SODIUM 40 MG PO TBEC
40.0000 mg | DELAYED_RELEASE_TABLET | Freq: Every day | ORAL | Status: DC
  Administered 2021-08-03 – 2021-08-05 (×3): 40 mg via ORAL
  Filled 2021-08-03 (×3): qty 1

## 2021-08-03 MED ORDER — MOMETASONE FURO-FORMOTEROL FUM 200-5 MCG/ACT IN AERO: 2.0000 | INHALATION_SPRAY | Freq: Two times a day (BID) | RESPIRATORY_TRACT | Status: DC

## 2021-08-03 MED ORDER — VITAMIN D 25 MCG (1000 UNIT) PO TABS
1000.0000 [IU] | ORAL_TABLET | Freq: Every evening | ORAL | Status: DC
  Administered 2021-08-03 – 2021-08-04 (×2): 1000 [IU] via ORAL
  Filled 2021-08-03 (×2): qty 1

## 2021-08-03 MED ORDER — FUROSEMIDE 10 MG/ML IJ SOLN
40.0000 mg | Freq: Every day | INTRAMUSCULAR | Status: DC
  Administered 2021-08-04 – 2021-08-05 (×2): 40 mg via INTRAVENOUS
  Filled 2021-08-03 (×2): qty 4

## 2021-08-03 MED ORDER — POTASSIUM CHLORIDE 20 MEQ PO PACK
20.0000 meq | PACK | Freq: Every day | ORAL | Status: DC
  Administered 2021-08-03 – 2021-08-05 (×3): 20 meq via ORAL
  Filled 2021-08-03 (×3): qty 1

## 2021-08-03 MED ORDER — MELATONIN 5 MG PO TABS
2.5000 mg | ORAL_TABLET | Freq: Every day | ORAL | Status: DC
  Administered 2021-08-03 – 2021-08-04 (×2): 2.5 mg via ORAL
  Filled 2021-08-03 (×2): qty 1

## 2021-08-03 MED ORDER — TAMSULOSIN HCL 0.4 MG PO CAPS
0.4000 mg | ORAL_CAPSULE | Freq: Every day | ORAL | Status: DC
  Administered 2021-08-03 – 2021-08-05 (×3): 0.4 mg via ORAL
  Filled 2021-08-03 (×3): qty 1

## 2021-08-03 MED ORDER — ALBUTEROL SULFATE HFA 108 (90 BASE) MCG/ACT IN AERS
2.0000 | INHALATION_SPRAY | Freq: Four times a day (QID) | RESPIRATORY_TRACT | Status: DC | PRN
  Administered 2021-08-04 – 2021-08-05 (×2): 2 via RESPIRATORY_TRACT
  Filled 2021-08-03 (×2): qty 6.7

## 2021-08-03 MED ORDER — ACETAMINOPHEN 500 MG PO TABS: 500.0000 mg | ORAL_TABLET | Freq: Three times a day (TID) | ORAL | Status: DC | PRN

## 2021-08-03 MED ORDER — VITAMIN B-12 1000 MCG PO TABS
1000.0000 ug | ORAL_TABLET | Freq: Every day | ORAL | Status: DC
  Administered 2021-08-03 – 2021-08-05 (×3): 1000 ug via ORAL
  Filled 2021-08-03 (×3): qty 1

## 2021-08-03 MED ORDER — FUROSEMIDE 10 MG/ML IJ SOLN
40.0000 mg | Freq: Once | INTRAMUSCULAR | Status: AC
  Administered 2021-08-03: 40 mg via INTRAVENOUS
  Filled 2021-08-03: qty 4

## 2021-08-03 MED ORDER — ATORVASTATIN CALCIUM 80 MG PO TABS
80.0000 mg | ORAL_TABLET | Freq: Every day | ORAL | Status: DC
  Administered 2021-08-03 – 2021-08-04 (×2): 80 mg via ORAL
  Filled 2021-08-03 (×2): qty 1

## 2021-08-03 MED ORDER — FERROUS SULFATE 325 (65 FE) MG PO TABS
325.0000 mg | ORAL_TABLET | Freq: Every day | ORAL | Status: DC
  Administered 2021-08-03 – 2021-08-05 (×3): 325 mg via ORAL
  Filled 2021-08-03 (×3): qty 1

## 2021-08-03 MED ORDER — IOHEXOL 350 MG/ML SOLN
75.0000 mL | Freq: Once | INTRAVENOUS | Status: AC | PRN
  Administered 2021-08-03: 75 mL via INTRAVENOUS

## 2021-08-03 NOTE — H&P (Addendum)
History and Physical    Lucius Wise WUJ:[id number] DOB: [birth year] DOA: 08/03/2021  PCP: [facility]   Patient coming from: Assisted living facility  I have personally briefly reviewed patient's old medical records in [place]  Chief Complaint: Shortness of breath  HPI: Giovanni Biby is a 75 y.o. male with medical history significant for coronary artery disease status post CABG, peripheral arterial disease status post left AKA in 9562, chronic diastolic dysfunction CHF with last known LVEF of 50 to 55%, nicotine dependence, aortic stenosis status post transcatheter AVR, COPD, hypertension who presents to the emergency room for evaluation of sudden onset shortness of breath associated with left anterior chest wall pain.  He rates his pain a 6 x 10 in intensity at its worst.  Pain is nonradiating and is associated with diaphoresis.  He denies having any nausea or vomiting.  He denies having any fever or chills.  He has a cough productive of clear phlegm. He denies having any leg swelling, no palpitations, no changes in his bowel habits, no urinary symptoms, no abdominal pain, no dizziness, no lightheadedness, no headache, no blurred vision, no focal deficit. Labs show sodium 142, potassium 3.6, chloride 112, bicarb 22, glucose 110, BUN 12, creatinine 1.2, calcium 8.4, alkaline phosphatase 73, albumin 3.2, lipase 98, AST 12, ALT 10, total protein 6.4, BNP 1688, troponin 22, white count 7.9, hemoglobin 9.4, hematocrit 39.3, MCV 88, RDW 16.6, platelet count 239, D-dimer 2.46 Respiratory viral panel is positive for SARS coronavirus 2 Chest x-ray reviewed by me shows cardiomegaly.  Diffuse interstitial opacity with Kelly B-lines. CT angiogram of the chest shows right lower lobe airway debridement atelectasis.  Mild interstitial edema with small right pleural effusion.  Negative for pulmonary embolism.  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 Twelve-lead EKG  reviewed by me shows sinus rhythm with PVCs.  Non specific T wave changes.  Low voltage QRS   ED Course: Patient is a 75 year old male who presents to the ER via EMS for evaluation of sudden onset shortness of breath as well as left anterior chest wall pain.  He tested positive for SARS coronavirus 2. Imaging suggestive of acute CHF.  Patient received a dose of Lasix and will be admitted to the hospital for further evaluation.    Review of Systems: As per HPI otherwise all other systems reviewed and negative.    Past Medical History:  Diagnosis Date   Aortic stenosis    a. Pt unaware of history but CT imaging 01/2019 consistent w/ AVR; b. 01/2019 Echo: Mild to mod AS. Mean grad 21mmHg.   CAD (coronary artery disease)    a. 1994 s/p CABG ([place]); b. Reports multiple stress tests over the years w/o repeat cath; c. 01/2019 MV: large, sev, fixed inf and inflat defect extending to apex. No ischemia. EF 37%-->Med rx given atypical Ss and pt wishes.   CKD (chronic kidney disease), stage III (HCC)    COPD (chronic obstructive pulmonary disease) (HCC)    Depression    Essential hypertension    GERD (gastroesophageal reflux disease)    Hyperlipidemia LDL goal <70    Ischemic cardiomyopathy    a. 01/2019 Echo: EF 40-45%, impaired relaxation. Mildly dil LA. Sev mitral annular Ca2+. Mild to mod AS.   PAD (peripheral artery disease) ([place])    a. 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   PAF (paroxysmal atrial fibrillation) (HCC)    a. CHA2DS2VASc = 4-->Xarelto 15mg  daily in setting of CKD.   Thyrotoxicosis  Tobacco abuse    Type II diabetes mellitus ([place])     Past Surgical History:  Procedure Laterality Date   CARDIAC CATHETERIZATION     CHOLECYSTECTOMY     COLONOSCOPY     COLONOSCOPY WITH PROPOFOL N/A 07/20/2021   Procedure: COLONOSCOPY WITH PROPOFOL;  Surgeon: Lin Landsman, MD;  Location: [facility] ENDOSCOPY;  Service: Gastroenterology;  Laterality: N/A;   CORONARY ARTERY BYPASS GRAFT     a. [place]   ENDOSCOPIC RETROGRADE CHOLANGIOPANCREATOGRAPHY (ERCP) WITH PROPOFOL N/A 10/19/2020   Procedure: ENDOSCOPIC RETROGRADE CHOLANGIOPANCREATOGRAPHY (ERCP) WITH PROPOFOL;  Surgeon: [name], MD;  Location: [facility] ENDOSCOPY;  Service: Endoscopy;  Laterality: N/A;   ERCP N/A 12/29/2020   Procedure: ENDOSCOPIC RETROGRADE CHOLANGIOPANCREATOGRAPHY (ERCP);  Surgeon: [name], MD;  Location: [facility] ENDOSCOPY;  Service: Endoscopy;  Laterality: N/A;   ESOPHAGOGASTRODUODENOSCOPY (EGD) WITH PROPOFOL N/A 07/18/2021   Procedure: ESOPHAGOGASTRODUODENOSCOPY (EGD) WITH PROPOFOL;  Surgeon: Lin Landsman, MD;  Location: [facility] ENDOSCOPY;  Service: Gastroenterology;  Laterality: N/A;   Left AKA     RIGHT/LEFT HEART CATH AND CORONARY ANGIOGRAPHY N/A 06/06/2020   Procedure: RIGHT/LEFT HEART CATH AND CORONARY ANGIOGRAPHY;  Surgeon: Wellington Hampshire, MD;  Location: [place] [facility] LAB;  Service: Cardiovascular;  Laterality: N/A;     reports that he has been smoking cigarettes. He has a 62.00 pack-year smoking history. He has never used smokeless tobacco. He reports that he does not drink alcohol and does not use drugs.  No Known Allergies  Family History  Problem Relation Age of Onset   Heart attack Mother        died @ 46.   Other Father        never knew his father.   Other Sister        overdose of sleeping pills.   Heart disease Brother        died in his 45's   Other Sister        complication of abd surgery @ 49   CAD Sister       Prior to Admission medications   Medication Sig Start Date End Date Taking? Authorizing Provider  acetaminophen (TYLENOL) 500 MG tablet Take 500 mg by mouth every 8 (eight) hours as needed.   Yes [provider]  albuterol (VENTOLIN HFA) 108 (90 Base) MCG/ACT inhaler Inhale 2 puffs into the lungs every 6 (six) hours as needed for wheezing or shortness of breath.    Yes [provider]  aspirin EC 81 MG tablet Take 81 mg by mouth daily. Swallow  whole.   Yes [provider]  atorvastatin (LIPITOR) 80 MG tablet Take 80 mg by mouth at bedtime.   Yes [provider]  cholecalciferol (VITAMIN D3) 25 MCG (1000 UNIT) tablet Take 1,000 Units by mouth every evening.   Yes [provider]  dapagliflozin propanediol (FARXIGA) 5 MG TABS tablet Take 5 mg by mouth daily.   Yes [provider]  DULoxetine (CYMBALTA) 30 MG capsule Take 30 mg by mouth 2 (two) times daily.   Yes [provider]  ferrous sulfate 325 (65 FE) MG tablet Take 325 mg by mouth daily with breakfast.   Yes [provider]  furosemide (LASIX) 40 MG tablet Take 40 mg by mouth daily.   Yes [provider]  hydrOXYzine (ATARAX/VISTARIL) 25 MG tablet Take 25 mg by mouth every 6 (six) hours as needed (allergy symptoms).   Yes [provider]  isosorbide mononitrate (IMDUR) 30  MG 24 hr tablet Take 1 tablet (30 mg total) by mouth daily. 03/11/20 05/27/29 Yes Sharen Hones, MD  lamoTRIgine (LAMICTAL) 25 MG tablet Take 25 mg by mouth 2 (two) times daily.   Yes [provider]  Melatonin 3 MG TABS Take 1 tablet by mouth at bedtime.   Yes [provider]  montelukast (SINGULAIR) 10 MG tablet Take 10 mg by mouth at bedtime.   Yes [provider]  Multiple Vitamins-Minerals (PRESERVISION AREDS) CAPS Take 1 capsule by mouth in the morning and at bedtime.   Yes [provider]  nitroGLYCERIN (NITROSTAT) 0.4 MG SL tablet Place 0.4 mg under the tongue every 5 (five) minutes as needed for chest pain.   Yes [provider]  pantoprazole (PROTONIX) 40 MG tablet Take 40 mg by mouth daily.   Yes [provider]  potassium chloride (KLOR-CON) 20 MEQ packet Take 20 mEq by mouth daily.   Yes [provider]  sacubitril-valsartan (ENTRESTO) 24-26 MG Take 1 tablet by mouth 2 (two) times daily.   Yes [provider]  senna (SENOKOT) 8.6 MG tablet Take 1 tablet by mouth  daily.   Yes [provider]  tamsulosin (FLOMAX) 0.4 MG CAPS capsule Take 0.4 mg by mouth daily.   Yes [provider]  vitamin B-12 (CYANOCOBALAMIN) 1000 MCG tablet Take 1,000 mcg by mouth daily.   Yes [provider]  apixaban (ELIQUIS) 5 MG TABS tablet Take 1 tablet (5 mg total) by mouth 2 (two) times daily. 07/22/21 08/21/21  Loletha Grayer, MD  Fluticasone-Salmeterol (ADVAIR) 250-50 MCG/DOSE AEPB Inhale 1 puff into the lungs 2 (two) times daily. Patient not taking: Reported on 08/03/2021    [provider]  ketotifen (ZADITOR) 0.025 % ophthalmic solution Place 1 drop into both eyes 2 (two) times daily as needed.  Patient not taking: Reported on 08/03/2021    [provider]  loratadine (CLARITIN) 10 MG tablet Take 10 mg by mouth daily.    [provider]  midodrine (PROAMATINE) 5 MG tablet Take 1 tablet (5 mg total) by mouth 3 (three) times daily with meals. Patient not taking: Reported on 08/03/2021 07/20/21   Loletha Grayer, MD  nicotine (NICODERM CQ - DOSED IN MG/24 HOURS) 14 mg/24hr patch One patch chest wall daily (okay to substitute generic) Patient not taking: Reported on 08/03/2021 07/20/21   Loletha Grayer, MD  Rivaroxaban (XARELTO) 15 MG TABS tablet Take 15 mg by mouth daily.    [provider]    Physical Exam: Vitals:   08/03/21 0500 08/03/21 0619 08/03/21 0700 08/03/21 0730  BP: 127/70 129/80 (!) 154/80 130/88  Pulse: 74 65 67 80  Resp: (!) 22 19 14  (!) 22  Temp:      TempSrc:      SpO2: 90% 97% 96% 97%  Weight:      Height:         Vitals:   08/03/21 0500 08/03/21 0619 08/03/21 0700 08/03/21 0730  BP: 127/70 129/80 (!) 154/80 130/88  Pulse: 74 65 67 80  Resp: (!) 22 19 14  (!) 22  Temp:      TempSrc:      SpO2: 90% 97% 96% 97%  Weight:      Height:          Constitutional: Alert and oriented x 3 .  Has mild conversational dyspnea.  Chronically ill-appearing. HEENT:      Head:  Normocephalic and atraumatic.  Eyes: PERLA, EOMI, Conjunctivae are normal. Sclera is non-icteric.       Mouth/Throat: Mucous membranes are moist.       Neck: Supple with no signs of meningismus. Cardiovascular: Regular rate and rhythm. No murmurs, gallops, or rubs. 2+ symmetrical distal pulses are present . No JVD. No LE edema Respiratory: Tachypneic.crackles in both lung bases bilaterally.  No wheezes or rhonchi.  Gastrointestinal: Soft, non tender, and non distended with positive bowel sounds.  Genitourinary: No CVA tenderness. Musculoskeletal: Nontender with normal range of motion in all extremities. No cyanosis, or erythema of extremities.  Left AKA Neurologic:  Face is symmetric. Moving all extremities. No gross focal neurologic deficits . Skin: Skin is warm, dry.  No rash or ulcers Psychiatric: Mood and affect are normal    Labs on Admission: I have personally reviewed following labs and imaging studies  CBC: Recent Labs  Lab 08/03/21 0355  WBC 7.9  NEUTROABS 5.1  HGB 9.4*  HCT 29.3*  MCV 88.0  PLT 751   Basic Metabolic Panel: Recent Labs  Lab 08/03/21 0355  NA 142  K 3.6  CL 112*  CO2 22  GLUCOSE 110*  BUN 12  CREATININE 1.22  CALCIUM 8.4*   GFR: Estimated Creatinine Clearance: 46.5 mL/min (by C-G formula based on SCr of 1.22 mg/dL). Liver Function Tests: Recent Labs  Lab 08/03/21 0355  AST 12*  ALT 10  ALKPHOS 73  BILITOT 0.9  PROT 6.4*  ALBUMIN 3.2*   Recent Labs  Lab 08/03/21 0355  LIPASE 98*   No results for input(s): AMMONIA in the last 168 hours. Coagulation Profile: No results for input(s): INR, PROTIME in the last 168 hours. Cardiac Enzymes: No results for input(s): CKTOTAL, CKMB, CKMBINDEX, TROPONINI in the last 168 hours. BNP (last 3 results) No results for input(s): PROBNP in the last 8760 hours. HbA1C: No results for input(s): HGBA1C in the last 72 hours. CBG: No results for input(s): GLUCAP in the last 168 hours. Lipid  Profile: No results for input(s): CHOL, HDL, LDLCALC, TRIG, CHOLHDL, LDLDIRECT in the last 72 hours. Thyroid Function Tests: No results for input(s): TSH, T4TOTAL, FREET4, T3FREE, THYROIDAB in the last 72 hours. Anemia Panel: No results for input(s): VITAMINB12, FOLATE, FERRITIN, TIBC, IRON, RETICCTPCT in the last 72 hours. Urine analysis:    Component Value Date/Time   COLORURINE STRAW (A) 07/15/2021 1839   APPEARANCEUR CLEAR (A) 07/15/2021 1839   LABSPEC 1.014 07/15/2021 1839   PHURINE 5.0 07/15/2021 1839   GLUCOSEU 150 (A) 07/15/2021 1839   HGBUR NEGATIVE 07/15/2021 1839   BILIRUBINUR NEGATIVE 07/15/2021 1839   KETONESUR NEGATIVE 07/15/2021 1839   PROTEINUR NEGATIVE 07/15/2021 1839   NITRITE NEGATIVE 07/15/2021 1839   LEUKOCYTESUR NEGATIVE 07/15/2021 1839    Radiological Exams on Admission: CT Angio Chest PE W and/or Wo Contrast  Result Date: 08/03/2021 CLINICAL DATA:  Shortness of breath EXAM: CT ANGIOGRAPHY CHEST WITH CONTRAST TECHNIQUE: Multidetector CT imaging of the chest was performed using the standard protocol during bolus administration of intravenous contrast. Multiplanar CT image reconstructions and MIPs were obtained to evaluate the vascular anatomy. CONTRAST:  37mL OMNIPAQUE IOHEXOL 350 MG/ML SOLN COMPARISON:  None similar FINDINGS: Cardiovascular: Satisfactory opacification of the pulmonary arteries to the segmental level. No evidence of pulmonary embolism. Cardiomegaly with transmural lateral left ventricular thinning. CABG in transcatheter aortic valve replacement. Extensive atheromatous calcification. Mediastinum/Nodes: Negative for adenopathy or mass Lungs/Pleura: Airway debris in the right lower lobe airway narrowing and lobar opacity with volume loss. Diffuse  motion artifact. Mild interlobular septal thickening at the bases with small right pleural effusion. Upper Abdomen: Negative Musculoskeletal: Negative Review of the MIP images confirms the above findings.  IMPRESSION: 1. Right lower lobe airway debris with atelectasis. 2. Mild interstitial edema with small right pleural effusion. 3. Negative for pulmonary embolism. 4. 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 Electronically Signed   By: Jorje Guild M.D.   On: 08/03/2021 05:46   DG Chest Portable 1 View  Result Date: 08/03/2021 CLINICAL DATA:  Chest pain EXAM: PORTABLE CHEST 1 VIEW COMPARISON:  07/15/2021 FINDINGS: Cardiomegaly and stable aortic tortuosity. Transcatheter aortic valve replacement. Diffuse interstitial opacity with Kerley lines. No visible effusion or pneumothorax. Artifact from EKG leads. Prior left mid clavicle fracture which is healed IMPRESSION: CHF pattern. Electronically Signed   By: Jorje Guild M.D.   On: 08/03/2021 04:38     Assessment/Plan Principal Problem:   Acute diastolic CHF (congestive heart failure) (HCC) Active Problems:   Chronic kidney disease, stage 3a (HCC)   Coronary artery disease of native artery of native heart with stable angina pectoris (HCC)   AF (paroxysmal atrial fibrillation) (HCC)   Depression   Tobacco abuse   Type 2 diabetes mellitus with stage 3a chronic kidney disease, without long-term current use of insulin ([place])   COVID-19 virus infection    Patient is a 75 year old male who presents to the ER for evaluation of sudden onset shortness of breath associated with left anterior chest wall pain.    Acute on chronic diastolic dysfunction CHF Last known LVEF from December, 2021 was 50 to 55% Place patient on Lasix 40 mg IV daily Continue nitrates, [place] and Entresto. Maintain low-sodium diet and check daily     Chest pain In a patient with a known history of coronary artery disease Cycle cardiac enzymes Continue aspirin, nitrates and high intensity statins    COVID-19 virus infection Patient is vaccinated against the SARS coronavirus 2 He presents for evaluation of worsening shortness of breath and tests  positive for the SARS coronavirus 2 He is not hypoxic Will place patient on remdesivir per protocol Start patient on antitussives and as needed bronchodilator therapy Will place patient on contact and airborne precautions.      Diabetes mellitus with complications of stage IIIa chronic kidney disease Maintain consistent carbohydrate diet Continue Farxiga Check blood sugars before meals and at bedtime     History of paroxysmal atrial fibrillation Continue apixaban as primary prophylaxis for an acute stroke     Peripheral arterial disease Status post left AKA Continue Eliquis and atorvastatin     Nicotine dependence Smoking cessation was discussed with patient in detail. (Time spent counseling patient was about 5 minutes) Will place patient on a nicotine transdermal patch 7 mg daily     Depression Continue Cymbalta   DVT prophylaxis: Apixaban Code Status: full code  Family Communication: Greater than 50% of time was spent discussing patient's condition and plan of care with him at the bedside.  All questions and concerns have been addressed.  He verbalizes understanding and agrees with the plan.  CODE STATUS was discussed and patient wishes to remain a full code.  He does not list anyone as his healthcare power of attorney Disposition Plan: Back to previous home environment Consults called: none  Status:At the time of admission, it appears that the appropriate admission status for this patient is inpatient. This is judged to be reasonable and necessary to provide the required intensity of service to  ensure the patient's safety given the presenting symptoms, physical exam findings, and initial radiographic and laboratory data in the context of their comorbid conditions. Patient requires inpatient status due to high intensity of service, high risk for further deterioration and high frequency of surveillance required.     Collier Bullock MD [facility]     08/03/2021, 8:48 AM

## 2021-08-03 NOTE — ED Notes (Signed)
Pt repositioned in bed and given better bedside table to eat

## 2021-08-03 NOTE — Progress Notes (Signed)
*  PRELIMINARY RESULTS* Echocardiogram 2D Echocardiogram has been performed.  Sherrie Sport 08/03/2021, 1:37 PM

## 2021-08-03 NOTE — ED Notes (Signed)
Troponin drawn by this RN. Pt's upset, asking for pain medicine. Pt's nurse Ariel is aware but no pain medicine is due at this time.

## 2021-08-03 NOTE — ED Notes (Signed)
Pt denies any needs at this time.

## 2021-08-03 NOTE — ED Notes (Signed)
Will hold all PO meds until after SLP

## 2021-08-03 NOTE — Plan of Care (Signed)
Pt admitted for shortness of breath/chest pain. Pt on tele, nsr. No iv fluilds skin intact with no issues. Pt on external male catheter. Pt given PM meds without problem. BP 103/65 (BP Location: Right Arm)   Pulse 91   Temp 98.7 F (37.1 C) (Oral)   Resp 20   Ht 6' (1.829 m)   Wt 64.8 kg   SpO2 95%   BMI 19.38 kg/m  No c/o chest pain or shortness of breath at this time. Blood sugar WNL. Pt given call bell, bed in lowest position, 2/4 rails up. No further needs at this time. Shawn Jennings  08/03/21 11:00 PM

## 2021-08-03 NOTE — ED Provider Notes (Addendum)
[facility] Emergency Department Provider Note  ____________________________________________   None    (approximate)  I have reviewed the triage vital signs and the nursing notes.   HISTORY  Chief Complaint Chest Pain (Pt in via EMS from [facility] home c/o chest pressure. Cov +)    HPI Shawn Jennings is a 75 y.o. male with extensive past medical history including history of CAD, CKD, aortic stenosis, ischemic cardiomyopathy, status post CABG, diabetes, here with chest pain.  The patient states that this afternoon, he developed acute, severe, pressure-like chest pain.  He was going to bed when this began.  He recently was diagnosed with COVID but states he had been doing fairly well with it.  He has had associated shortness of breath.  He has had diaphoresis.  No nausea or vomiting.  He states that he has not had chest pain with his COVID prior to this episode today.  Denies any fevers.  Denies any sputum production.  He is essentially immobile due to a history of amputation.  Denies any recent medication changes.  Denies known history of blood clots.  He does have history of A. fib on anticoagulation.       Past Medical History:  Diagnosis Date   Aortic stenosis    a. Pt unaware of history but CT imaging 01/2019 consistent w/ AVR; b. 01/2019 Echo: Mild to mod AS. Mean grad 28mmHg.   CAD (coronary artery disease)    a. 1994 s/p CABG ([place]); b. Reports multiple stress tests over the years w/o repeat cath; c. 01/2019 MV: large, sev, fixed inf and inflat defect extending to apex. No ischemia. EF 37%-->Med rx given atypical Ss and pt wishes.   CKD (chronic kidney disease), stage III (HCC)    COPD (chronic obstructive pulmonary disease) (HCC)    Depression    Essential hypertension    GERD (gastroesophageal reflux disease)    Hyperlipidemia LDL goal <70    Ischemic cardiomyopathy    a. 01/2019 Echo: EF 40-45%, impaired relaxation. Mildly dil LA. Sev mitral  annular Ca2+. Mild to mod AS.   PAD (peripheral artery disease) ([place])    a. 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   PAF (paroxysmal atrial fibrillation) (HCC)    a. CHA2DS2VASc = 4-->Xarelto 15mg  daily in setting of CKD.   Thyrotoxicosis    Tobacco abuse    Type II diabetes mellitus ([place])     Patient Active Problem List   Diagnosis Date Noted   CHF exacerbation ([place]) 08/03/2021   Hypotension    Acute kidney injury superimposed on CKD ([place])    Acute blood loss anemia    Hypokalemia    Type 2 diabetes mellitus with stage 3a chronic kidney disease, without long-term current use of insulin (HCC)    Abdominal pain    GI bleed 07/15/2021   AF (paroxysmal atrial fibrillation) ([place]) 07/15/2021   On anticoagulant therapy 07/15/2021   Depression 07/15/2021   GERD (gastroesophageal reflux disease) 07/15/2021   Tobacco abuse 07/15/2021   Hyperlipidemia 07/15/2021   Upper GI bleed    Encounter for fitting and adjustment of other gastrointestinal appliance and device    Cholangitis    Protein-calorie malnutrition, severe 10/18/2020   Choledocholithiasis 10/17/2020   Coronary artery disease of native artery of native heart with stable angina pectoris ([place])    Ischemic cardiomyopathy    Other chest pain 03/10/2020   Chronic kidney disease, stage 3a ([place]) 03/09/2020   Essential hypertension 03/09/2020   Chest pain  01/27/2019   Renal insufficiency    Nonspecific chest pain 01/26/2019    Past Surgical History:  Procedure Laterality Date   CARDIAC CATHETERIZATION     CHOLECYSTECTOMY     COLONOSCOPY     COLONOSCOPY WITH PROPOFOL N/A 07/20/2021   Procedure: COLONOSCOPY WITH PROPOFOL;  Surgeon: Lin Landsman, MD;  Location: [facility] ENDOSCOPY;  Service: Gastroenterology;  Laterality: N/A;   CORONARY ARTERY BYPASS GRAFT     a. [place]   ENDOSCOPIC RETROGRADE CHOLANGIOPANCREATOGRAPHY (ERCP) WITH PROPOFOL N/A 10/19/2020   Procedure: ENDOSCOPIC RETROGRADE CHOLANGIOPANCREATOGRAPHY (ERCP) WITH PROPOFOL;   Surgeon: [name], MD;  Location: [facility] ENDOSCOPY;  Service: Endoscopy;  Laterality: N/A;   ERCP N/A 12/29/2020   Procedure: ENDOSCOPIC RETROGRADE CHOLANGIOPANCREATOGRAPHY (ERCP);  Surgeon: [name], MD;  Location: [facility] ENDOSCOPY;  Service: Endoscopy;  Laterality: N/A;   ESOPHAGOGASTRODUODENOSCOPY (EGD) WITH PROPOFOL N/A 07/18/2021   Procedure: ESOPHAGOGASTRODUODENOSCOPY (EGD) WITH PROPOFOL;  Surgeon: Lin Landsman, MD;  Location: [facility] ENDOSCOPY;  Service: Gastroenterology;  Laterality: N/A;   Left AKA     RIGHT/LEFT HEART CATH AND CORONARY ANGIOGRAPHY N/A 06/06/2020   Procedure: RIGHT/LEFT HEART CATH AND CORONARY ANGIOGRAPHY;  Surgeon: Wellington Hampshire, MD;  Location: [facility] CV LAB;  Service: Cardiovascular;  Laterality: N/A;    Prior to Admission medications   Medication Sig Start Date End Date Taking? Authorizing Provider  acetaminophen (TYLENOL) 500 MG tablet Take 500 mg by mouth every 8 (eight) hours as needed.   Yes [provider]  albuterol (VENTOLIN HFA) 108 (90 Base) MCG/ACT inhaler Inhale 2 puffs into the lungs every 6 (six) hours as needed for wheezing or shortness of breath.    Yes [provider]  aspirin EC 81 MG tablet Take 81 mg by mouth daily. Swallow whole.   Yes [provider]  atorvastatin (LIPITOR) 80 MG tablet Take 80 mg by mouth at bedtime.   Yes [provider]  cholecalciferol (VITAMIN D3) 25 MCG (1000 UNIT) tablet Take 1,000 Units by mouth every evening.   Yes [provider]  dapagliflozin propanediol (FARXIGA) 5 MG TABS tablet Take 5 mg by mouth daily.   Yes [provider]  DULoxetine (CYMBALTA) 30 MG capsule Take 30 mg by mouth 2 (two) times daily.   Yes [provider]  ferrous sulfate 325 (65 FE) MG tablet Take 325 mg by mouth daily with breakfast.   Yes [provider]  furosemide (LASIX) 40 MG tablet Take 40 mg by mouth daily.   Yes [provider]  hydrOXYzine  (ATARAX/VISTARIL) 25 MG tablet Take 25 mg by mouth every 6 (six) hours as needed (allergy symptoms).   Yes [provider]  isosorbide mononitrate (IMDUR) 30 MG 24 hr tablet Take 1 tablet (30 mg total) by mouth daily. 03/11/20 05/27/29 Yes Sharen Hones, MD  lamoTRIgine (LAMICTAL) 25 MG tablet Take 25 mg by mouth 2 (two) times daily.   Yes [provider]  Melatonin 3 MG TABS Take 1 tablet by mouth at bedtime.   Yes [provider]  montelukast (SINGULAIR) 10 MG tablet Take 10 mg by mouth at bedtime.   Yes [provider]  Multiple Vitamins-Minerals (PRESERVISION AREDS) CAPS Take 1 capsule by mouth in the morning and at bedtime.   Yes [provider]  nitroGLYCERIN (NITROSTAT) 0.4 MG SL tablet Place 0.4 mg under the tongue every 5 (five) minutes as needed for chest pain.   Yes [provider]  pantoprazole (PROTONIX) 40 MG tablet  Take 40 mg by mouth daily.   Yes [provider]  potassium chloride (KLOR-CON) 20 MEQ packet Take 20 mEq by mouth daily.   Yes [provider]  sacubitril-valsartan (ENTRESTO) 24-26 MG Take 1 tablet by mouth 2 (two) times daily.   Yes [provider]  senna (SENOKOT) 8.6 MG tablet Take 1 tablet by mouth daily.   Yes [provider]  tamsulosin (FLOMAX) 0.4 MG CAPS capsule Take 0.4 mg by mouth daily.   Yes [provider]  vitamin B-12 (CYANOCOBALAMIN) 1000 MCG tablet Take 1,000 mcg by mouth daily.   Yes [provider]  apixaban (ELIQUIS) 5 MG TABS tablet Take 1 tablet (5 mg total) by mouth 2 (two) times daily. 07/22/21 08/21/21  Loletha Grayer, MD  Fluticasone-Salmeterol (ADVAIR) 250-50 MCG/DOSE AEPB Inhale 1 puff into the lungs 2 (two) times daily. Patient not taking: Reported on 08/03/2021    [provider]  ketotifen (ZADITOR) 0.025 % ophthalmic solution Place 1 drop into both eyes 2 (two) times daily as needed.  Patient not taking: Reported on 08/03/2021     [provider]  loratadine (CLARITIN) 10 MG tablet Take 10 mg by mouth daily.    [provider]  midodrine (PROAMATINE) 5 MG tablet Take 1 tablet (5 mg total) by mouth 3 (three) times daily with meals. Patient not taking: Reported on 08/03/2021 07/20/21   Loletha Grayer, MD  nicotine (NICODERM CQ - DOSED IN MG/24 HOURS) 14 mg/24hr patch One patch chest wall daily (okay to substitute generic) Patient not taking: Reported on 08/03/2021 07/20/21   Loletha Grayer, MD  Rivaroxaban (XARELTO) 15 MG TABS tablet Take 15 mg by mouth daily.    [provider]    Allergies Patient has no known allergies.  Family History  Problem Relation Age of Onset   Heart attack Mother        died @ 51.   Other Father        never knew his father.   Other Sister        overdose of sleeping pills.   Heart disease Brother        died in his 76's   Other Sister        complication of abd surgery @ 66   CAD Sister     Social History Social History   Tobacco Use   Smoking status: Every Day    Packs/day: 1.00    Years: 62.00    Pack years: 62.00    Types: Cigarettes   Smokeless tobacco: Never  Vaping Use   Vaping Use: Never used  Substance Use Topics   Alcohol use: Never   Drug use: Never    Review of Systems  Review of Systems  Constitutional:  Positive for fatigue. Negative for chills and fever.  HENT:  Negative for sore throat.   Respiratory:  Positive for chest tightness and shortness of breath.   Cardiovascular:  Positive for chest pain.  Gastrointestinal:  Negative for abdominal pain.  Genitourinary:  Negative for flank pain.  Musculoskeletal:  Negative for neck pain.  Skin:  Negative for rash and wound.  Allergic/Immunologic: Negative for immunocompromised state.  Neurological:  Negative for weakness and numbness.  Hematological:  Does not bruise/bleed easily.  All other systems reviewed and are negative.    ____________________________________________  PHYSICAL EXAM:      VITAL SIGNS: ED Triage Vitals  Enc Vitals Group     BP --  Pulse --      Resp --      Temp --      Temp src --      SpO2 08/03/21 0350 96 %     Weight 08/03/21 0351 142 lb 14.4 oz (64.8 kg)     Height 08/03/21 0351 6' (1.829 m)     Head Circumference --      Peak Flow --      Pain Score --      Pain Loc --      Pain Edu? --      Excl. in [place]? --      Physical Exam Vitals and nursing note reviewed.  Constitutional:      General: He is not in acute distress.    Appearance: He is well-developed.  HENT:     Head: Normocephalic and atraumatic.  Eyes:     Conjunctiva/sclera: Conjunctivae normal.  Cardiovascular:     Rate and Rhythm: Normal rate and regular rhythm.     Heart sounds: Normal heart sounds. No murmur heard.   No friction rub.  Pulmonary:     Effort: Pulmonary effort is normal. No respiratory distress.     Breath sounds: Normal breath sounds. No wheezing or rales.  Abdominal:     General: There is no distension.     Palpations: Abdomen is soft.     Tenderness: There is no abdominal tenderness.  Musculoskeletal:     Cervical back: Neck supple.  Skin:    General: Skin is warm.     Capillary Refill: Capillary refill takes less than 2 seconds.  Neurological:     Mental Status: He is alert and oriented to person, place, and time.     Motor: No abnormal muscle tone.      ____________________________________________   LABS (all labs ordered are listed, but only abnormal results are displayed)  Labs Reviewed  RESP PANEL BY RT-PCR (FLU A&B, COVID) ARPGX2 - Abnormal; Notable for the following components:      Result Value   SARS Coronavirus 2 by RT PCR POSITIVE (*)    All other components within normal limits  CBC WITH DIFFERENTIAL/PLATELET - Abnormal; Notable for the following components:   RBC 3.33 (*)    Hemoglobin 9.4 (*)    HCT 29.3 (*)    RDW 16.6 (*)    All other components  within normal limits  COMPREHENSIVE METABOLIC PANEL - Abnormal; Notable for the following components:   Chloride 112 (*)    Glucose, Bld 110 (*)    Calcium 8.4 (*)    Total Protein 6.4 (*)    Albumin 3.2 (*)    AST 12 (*)    All other components within normal limits  LIPASE, BLOOD - Abnormal; Notable for the following components:   Lipase 98 (*)    All other components within normal limits  D-DIMER, QUANTITATIVE - Abnormal; Notable for the following components:   D-Dimer, Quant 2.46 (*)    All other components within normal limits  BRAIN NATRIURETIC PEPTIDE - Abnormal; Notable for the following components:   B Natriuretic Peptide 1,688.3 (*)    All other components within normal limits  TROPONIN I (HIGH SENSITIVITY) - Abnormal; Notable for the following components:   Troponin I (High Sensitivity) 22 (*)    All other components within normal limits  PROCALCITONIN  TROPONIN I (HIGH SENSITIVITY)    ____________________________________________  EKG: Normal sinus rhythm, VR 69. PR 165, QRS 117, QTc 525. No acute  St elevations or depressions. No ischemia or infarct. ________________________________________  RADIOLOGY All imaging, including plain films, CT scans, and ultrasounds, independently reviewed by me, and interpretations confirmed via formal radiology reads.  ED MD interpretation:   CXR: CHF pattern CT Pending  Official radiology report(s): CT Angio Chest PE W and/or Wo Contrast  Result Date: 08/03/2021 CLINICAL DATA:  Shortness of breath EXAM: CT ANGIOGRAPHY CHEST WITH CONTRAST TECHNIQUE: Multidetector CT imaging of the chest was performed using the standard protocol during bolus administration of intravenous contrast. Multiplanar CT image reconstructions and MIPs were obtained to evaluate the vascular anatomy. CONTRAST:  41mL OMNIPAQUE IOHEXOL 350 MG/ML SOLN COMPARISON:  None similar FINDINGS: Cardiovascular: Satisfactory opacification of the pulmonary arteries to the  segmental level. No evidence of pulmonary embolism. Cardiomegaly with transmural lateral left ventricular thinning. CABG in transcatheter aortic valve replacement. Extensive atheromatous calcification. Mediastinum/Nodes: Negative for adenopathy or mass Lungs/Pleura: Airway debris in the right lower lobe airway narrowing and lobar opacity with volume loss. Diffuse motion artifact. Mild interlobular septal thickening at the bases with small right pleural effusion. Upper Abdomen: Negative Musculoskeletal: Negative Review of the MIP images confirms the above findings. IMPRESSION: 1. Right lower lobe airway debris with atelectasis. 2. Mild interstitial edema with small right pleural effusion. 3. Negative for pulmonary embolism. 4. 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 Electronically Signed   By: Jorje Guild M.D.   On: 08/03/2021 05:46   DG Chest Portable 1 View  Result Date: 08/03/2021 CLINICAL DATA:  Chest pain EXAM: PORTABLE CHEST 1 VIEW COMPARISON:  07/15/2021 FINDINGS: Cardiomegaly and stable aortic tortuosity. Transcatheter aortic valve replacement. Diffuse interstitial opacity with Kerley lines. No visible effusion or pneumothorax. Artifact from EKG leads. Prior left mid clavicle fracture which is healed IMPRESSION: CHF pattern. Electronically Signed   By: Jorje Guild M.D.   On: 08/03/2021 04:38    ____________________________________________  PROCEDURES   Procedure(s) performed (including Critical Care):  .1-3 Lead EKG Interpretation Performed by: Duffy Bruce, MD Authorized by: Duffy Bruce, MD     Interpretation: normal     ECG rate:  60-80   ECG rate assessment: normal     Rhythm: sinus rhythm     Ectopy: PVCs     Conduction: normal   Comments:     Indication: SOB  ____________________________________________  INITIAL IMPRESSION / MDM / ASSESSMENT AND PLAN / ED COURSE  As part of my medical decision making, I reviewed the following data within  the [facility] notes reviewed and incorporated, Old chart reviewed, Notes from prior ED visits, and [place] Controlled Substance Database       *Xian Alves was evaluated in Emergency Department on 08/03/2021 for the symptoms described in the history of present illness. He was evaluated in the context of the global COVID-19 pandemic, which necessitated consideration that the patient might be at risk for infection with the SARS-CoV-2 virus that causes COVID-19. Institutional protocols and algorithms that pertain to the evaluation of patients at risk for COVID-19 are in a state of rapid change based on information released by regulatory bodies including the CDC and federal and state organizations. These policies and algorithms were followed during the patient's care in the ED.  Some ED evaluations and interventions may be delayed as a result of limited staffing during the pandemic.*     Medical Decision Making:  75 yo M here with chest pressure and shortness of breath in the setting of COVID-19.  Patient arrives uncomfortable but in no  obvious respiratory distress.  Chest x-ray shows curly B-lines and signs of CHF.  Lab work reveals markedly elevated BNP which appears new, suspect acute CHF.  Procalcitonin negative, do not suspect superimposed pneumonia.  White blood cell count is normal.  Patient is COVID-positive.  D-dimer positive as well.  The patient is on Eliquis, but given the acuity of onset of his shortness of breath in addition to D-dimer, will check CT angio.  Will plan to admit to medicine for CHF exacerbation and acute respiratory distress in the setting of COVID-19. IV lasix given.  ____________________________________________  FINAL CLINICAL IMPRESSION(S) / ED DIAGNOSES  Final diagnoses:  COVID-19  Acute on chronic congestive heart failure, unspecified heart failure type ([place])     MEDICATIONS GIVEN DURING THIS VISIT:  Medications  nitroGLYCERIN (NITROSTAT)  SL tablet 0.4 mg (0.4 mg Sublingual Given 08/03/21 0355)  apixaban (ELIQUIS) tablet 5 mg (has no administration in time range)  sodium chloride flush (NS) 0.9 % injection 3 mL (has no administration in time range)  sodium chloride flush (NS) 0.9 % injection 3 mL (has no administration in time range)  0.9 %  sodium chloride infusion (has no administration in time range)  acetaminophen (TYLENOL) tablet 650 mg (has no administration in time range)  ondansetron (ZOFRAN) injection 4 mg (has no administration in time range)  furosemide (LASIX) injection 40 mg (40 mg Intravenous Given 08/03/21 0457)  iohexol (OMNIPAQUE) 350 MG/ML injection 75 mL (75 mLs Intravenous Contrast Given 08/03/21 0518)     ED Discharge Orders     None        Note:  This document was prepared using Dragon voice recognition software and may include unintentional dictation errors.   Duffy Bruce, MD 08/03/21 [id number]    Duffy Bruce, MD 08/03/21 [phone]

## 2021-08-03 NOTE — Consult Note (Signed)
Remdesivir - Pharmacy Brief Note   O:  ALT: 10 CXR: Cardiomegaly and stable aortic tortuosity. Diffuse interstitial opacity with Kerley lines. SpO2: 97% on RA   A/P:  Remdesivir 200 mg IVPB once followed by 100 mg IVPB daily x 4 days.   Darnelle Bos, PharmD 08/03/2021 9:10 AM

## 2021-08-03 NOTE — Evaluation (Signed)
Clinical/Bedside Swallow Evaluation Patient Details  Name: Shawn Jennings MRN: [id number] Date of Birth: 12/01/1945  Today's Date: 08/03/2021 Time: SLP Start Time (ACUTE ONLY): 1420 SLP Stop Time (ACUTE ONLY): 1455 SLP Time Calculation (min) (ACUTE ONLY): 35 min  Past Medical History:  Past Medical History:  Diagnosis Date   Aortic stenosis    a. Pt unaware of history but CT imaging 01/2019 consistent w/ AVR; b. 01/2019 Echo: Mild to mod AS. Mean grad 61mmHg.   CAD (coronary artery disease)    a. 1994 s/p CABG ([facility]); b. Reports multiple stress tests over the years w/o repeat cath; c. 01/2019 MV: large, sev, fixed inf and inflat defect extending to apex. No ischemia. EF 37%-->Med rx given atypical Ss and pt wishes.   CKD (chronic kidney disease), stage III (HCC)    COPD (chronic obstructive pulmonary disease) (HCC)    Depression    Essential hypertension    GERD (gastroesophageal reflux disease)    Hyperlipidemia LDL goal <70    Ischemic cardiomyopathy    a. 01/2019 Echo: EF 40-45%, impaired relaxation. Mildly dil LA. Sev mitral annular Ca2+. Mild to mod AS.   PAD (peripheral artery disease) ([place])    a. 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   PAF (paroxysmal atrial fibrillation) (HCC)    a. CHA2DS2VASc = 4-->Xarelto 15mg  daily in setting of CKD.   Thyrotoxicosis    Tobacco abuse    Type II diabetes mellitus ([place])    Past Surgical History:  Past Surgical History:  Procedure Laterality Date   CARDIAC CATHETERIZATION     CHOLECYSTECTOMY     COLONOSCOPY     COLONOSCOPY WITH PROPOFOL N/A 07/20/2021   Procedure: COLONOSCOPY WITH PROPOFOL;  Surgeon: Lin Landsman, MD;  Location: [facility] ENDOSCOPY;  Service: Gastroenterology;  Laterality: N/A;   CORONARY ARTERY BYPASS GRAFT     a. [place]   ENDOSCOPIC RETROGRADE CHOLANGIOPANCREATOGRAPHY (ERCP) WITH PROPOFOL N/A 10/19/2020   Procedure: ENDOSCOPIC RETROGRADE CHOLANGIOPANCREATOGRAPHY (ERCP) WITH PROPOFOL;  Surgeon: [name], MD;  Location:  [facility] ENDOSCOPY;  Service: Endoscopy;  Laterality: N/A;   ERCP N/A 12/29/2020   Procedure: ENDOSCOPIC RETROGRADE CHOLANGIOPANCREATOGRAPHY (ERCP);  Surgeon: [name], MD;  Location: [facility] ENDOSCOPY;  Service: Endoscopy;  Laterality: N/A;   ESOPHAGOGASTRODUODENOSCOPY (EGD) WITH PROPOFOL N/A 07/18/2021   Procedure: ESOPHAGOGASTRODUODENOSCOPY (EGD) WITH PROPOFOL;  Surgeon: Lin Landsman, MD;  Location: [facility] ENDOSCOPY;  Service: Gastroenterology;  Laterality: N/A;   Left AKA     RIGHT/LEFT HEART CATH AND CORONARY ANGIOGRAPHY N/A 06/06/2020   Procedure: RIGHT/LEFT HEART CATH AND CORONARY ANGIOGRAPHY;  Surgeon: Wellington Hampshire, MD;  Location: [place] CV LAB;  Service: Cardiovascular;  Laterality: N/A;   HPI:  Per admitting H &P " Shawn Jennings is a 75 y.o. male with medical history significant for coronary artery disease status post CABG, peripheral arterial disease status post left AKA in 0355, chronic diastolic dysfunction CHF with last known LVEF of 50 to 55%, nicotine dependence, aortic stenosis status post transcatheter AVR, COPD, hypertension who presents to the emergency room for evaluation of sudden onset shortness of breath associated with left anterior chest wall pain.  He rates his pain a 6 x 10 in intensity at its worst.  Pain is nonradiating and is associated with diaphoresis.  He denies having any nausea or vomiting.  He denies having any fever or chills.  He has a cough productive of clear phlegm.  He denies having any leg swelling, no palpitations, no changes in his bowel habits,  no urinary symptoms, no abdominal pain, no dizziness, no lightheadedness, no headache, no blurred vision, no focal deficit.  Labs show sodium 142, potassium 3.6, chloride 112, bicarb 22, glucose 110, BUN 12, creatinine 1.2, calcium 8.4, alkaline phosphatase 73, albumin 3.2, lipase 98, AST 12, ALT 10, total protein 6.4, BNP 1688, troponin 22, white count 7.9, hemoglobin 9.4, hematocrit 39.3, MCV 88, RDW  16.6, platelet count 239, D-dimer 2.46  Respiratory viral panel is positive for SARS coronavirus 2  Chest x-ray reviewed by me shows cardiomegaly.  Diffuse interstitial opacity with Kelly B-lines.  CT angiogram of the chest shows right lower lobe airway debridement atelectasis.  Mild interstitial edema with small right pleural effusion.  Negative for pulmonary embolism.  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  Twelve-lead EKG reviewed by me shows sinus rhythm with PVCs.  Non specific T wave changes.  Low voltage QRS        ED Course: Patient is a 75 year old male who presents to the ER via EMS for evaluation of sudden onset shortness of breath as well as left anterior chest wall pain.  He tested positive for SARS coronavirus 2.  Imaging suggestive of acute CHF.  Patient received a dose of Lasix and will be admitted to the hospital for further evaluation."    Assessment / Plan / Recommendation  Clinical Impression  Pt presents with mild dysphagia. Pt tested positive for COVID after admission. Pt tolerated all consistencies without s/s of aspiration with the exception of first Tsp of thin liquid. Oral phase adequate for purees. Pt has upper dentures but no lower dentures. He was able to masticate solids and clear oral cavity with alternating thin liquids. Pt reports no swallowing difficulty with foods or PO meds just poor appetite at present. Rec continue with regular diet. ST to follow up with toelration of diet through Nsg. SLP Visit Diagnosis: Dysphagia, unspecified (R13.10)    Aspiration Risk  Mild aspiration risk    Diet Recommendation Regular   Liquid Administration via: Straw;Cup Medication Administration: Whole meds with liquid Supervision: Patient able to self feed Compensations: Small sips/bites;Follow solids with liquid Postural Changes: Seated upright at 90 degrees;Remain upright for at least 30 minutes after po intake    Other  Recommendations      Recommendations  for follow up therapy are one component of a multi-disciplinary discharge planning process, led by the attending physician.  Recommendations may be updated based on patient status, additional functional criteria and insurance authorization.  Follow up Recommendations None      Frequency and Duration   N/A         Prognosis: Good        Swallow Study   General Date of Onset: 08/03/21 HPI: Per admitting H &P " Santhiago Collingsworth is a 75 y.o. male with medical history significant for coronary artery disease status post CABG, peripheral arterial disease status post left AKA in 0998, chronic diastolic dysfunction CHF with last known LVEF of 50 to 55%, nicotine dependence, aortic stenosis status post transcatheter AVR, COPD, hypertension who presents to the emergency room for evaluation of sudden onset shortness of breath associated with left anterior chest wall pain.  He rates his pain a 6 x 10 in intensity at its worst.  Pain is nonradiating and is associated with diaphoresis.  He denies having any nausea or vomiting.  He denies having any fever or chills.  He has a cough productive of clear phlegm.  He denies having any leg swelling, no  palpitations, no changes in his bowel habits, no urinary symptoms, no abdominal pain, no dizziness, no lightheadedness, no headache, no blurred vision, no focal deficit.  Labs show sodium 142, potassium 3.6, chloride 112, bicarb 22, glucose 110, BUN 12, creatinine 1.2, calcium 8.4, alkaline phosphatase 73, albumin 3.2, lipase 98, AST 12, ALT 10, total protein 6.4, BNP 1688, troponin 22, white count 7.9, hemoglobin 9.4, hematocrit 39.3, MCV 88, RDW 16.6, platelet count 239, D-dimer 2.46  Respiratory viral panel is positive for SARS coronavirus 2  Chest x-ray reviewed by me shows cardiomegaly.  Diffuse interstitial opacity with Kelly B-lines.  CT angiogram of the chest shows right lower lobe airway debridement atelectasis.  Mild interstitial edema with small right pleural  effusion.  Negative for pulmonary embolism.  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  Twelve-lead EKG reviewed by me shows sinus rhythm with PVCs.  Non specific T wave changes.  Low voltage QRS        ED Course: Patient is a 75 year old male who presents to the ER via EMS for evaluation of sudden onset shortness of breath as well as left anterior chest wall pain.  He tested positive for SARS coronavirus 2.  Imaging suggestive of acute CHF.  Patient received a dose of Lasix and will be admitted to the hospital for further evaluation." Type of Study: Bedside Swallow Evaluation Diet Prior to this Study: Regular Temperature Spikes Noted: No Respiratory Status: Room air History of Recent Intubation: No Behavior/Cognition: Alert;Cooperative;Pleasant mood Oral Cavity Assessment: Within Functional Limits Oral Care Completed by SLP: No Oral Cavity - Dentition: Dentures, top;Other (Comment) (No bottom dentures) Vision: Functional for self-feeding Patient Positioning: Upright in bed Baseline Vocal Quality: Normal Volitional Cough: Strong    Oral/Motor/Sensory Function Overall Oral Motor/Sensory Function: Within functional limits   Ice Chips Ice chips: Within functional limits   Thin Liquid Thin Liquid: Within functional limits Presentation: Cup;Straw;Spoon    Nectar Thick Nectar Thick Liquid: Not tested   Honey Thick Honey Thick Liquid: Not tested   Puree Puree: Within functional limits   Solid     Solid:  (Not aware that he had a bolus of food on his tongue) Presentation: Self Fed      Lucila Maine 08/03/2021,3:03 PM

## 2021-08-03 NOTE — ED Notes (Signed)
Pt was offered tylenol for pain that he has called out 3 times for- pt refused tylenol stating it does not work for him

## 2021-08-03 NOTE — ED Notes (Signed)
Pt given pillow and repositioned in bed

## 2021-08-03 NOTE — ED Notes (Signed)
Reassessed pt' chest pressure. Pt was asleep and woke up. Pt states when will he start breathing better. NADN MD aware

## 2021-08-04 DIAGNOSIS — I5031 Acute diastolic (congestive) heart failure: Secondary | ICD-10-CM

## 2021-08-04 DIAGNOSIS — Z89612 Acquired absence of left leg above knee: Secondary | ICD-10-CM | POA: Insufficient documentation

## 2021-08-04 DIAGNOSIS — E0590 Thyrotoxicosis, unspecified without thyrotoxic crisis or storm: Secondary | ICD-10-CM | POA: Insufficient documentation

## 2021-08-04 DIAGNOSIS — J449 Chronic obstructive pulmonary disease, unspecified: Secondary | ICD-10-CM | POA: Insufficient documentation

## 2021-08-04 LAB — BASIC METABOLIC PANEL
Anion gap: 7 (ref 5–15)
BUN: 14 mg/dL (ref 8–23)
CO2: 23 mmol/L (ref 22–32)
Calcium: 8.5 mg/dL — ABNORMAL LOW (ref 8.9–10.3)
Chloride: 111 mmol/L (ref 98–111)
Creatinine, Ser: 1.3 mg/dL — ABNORMAL HIGH (ref 0.61–1.24)
GFR, Estimated: 57 mL/min — ABNORMAL LOW (ref >60)
Glucose, Bld: 125 mg/dL — ABNORMAL HIGH (ref 70–99)
Potassium: 3.5 mmol/L (ref 3.5–5.1)
Sodium: 141 mmol/L (ref 135–145)

## 2021-08-04 LAB — GLUCOSE, CAPILLARY
Glucose-Capillary: 130 mg/dL — ABNORMAL HIGH (ref 70–99)
Glucose-Capillary: 187 mg/dL — ABNORMAL HIGH (ref 70–99)
Glucose-Capillary: 128 mg/dL — ABNORMAL HIGH (ref 70–99)
Glucose-Capillary: 142 mg/dL — ABNORMAL HIGH (ref 70–99)
Glucose-Capillary: 129 mg/dL — ABNORMAL HIGH (ref 70–99)

## 2021-08-04 LAB — TROPONIN I (HIGH SENSITIVITY): Troponin I (High Sensitivity): 21 ng/L — ABNORMAL HIGH (ref <18)

## 2021-08-04 LAB — TSH: TSH: 0.428 u[IU]/mL (ref 0.350–4.500)

## 2021-08-04 MED ORDER — DAPAGLIFLOZIN PROPANEDIOL 5 MG PO TABS
10.0000 mg | ORAL_TABLET | Freq: Every day | ORAL | Status: DC
  Administered 2021-08-05: 10 mg via ORAL
  Filled 2021-08-04: qty 2

## 2021-08-04 NOTE — Progress Notes (Signed)
Chart reviewed. No reports of dysphagia with current diet. Please notify ST if we can be of further service or if any dysphagia concerns.

## 2021-08-04 NOTE — TOC Progression Note (Addendum)
Transition of Care [facility]) - Progression Note    Patient Details  Name: Shawn Jennings MRN: [id number] Date of Birth: 05-26-1946  [facility] CM/SW [place], LCSW Phone Number: 08/04/2021, 9:51 AM  Clinical Narrative:   Left VM for Jasmine at [facility] requesting return call to confirm patient can return to ALF tomorrow.   10:30- Spoke to [employer] with [employer] who confirmed patient can come back tomorrow. Will need EMS transport. Debroah Baller is aware patient tested COVID +. Debroah Baller will be the contact person tomorrow.        Expected Discharge Plan and Services           Expected Discharge Date: 08/07/21                                     Social Determinants of Health (SDOH) Interventions    Readmission Risk Interventions No flowsheet data found.

## 2021-08-04 NOTE — Progress Notes (Addendum)
PROGRESS NOTE    Shawn Jennings  YHC:[id number] DOB: 06-Sep-1946 DOA: 08/03/2021 PCP: Orvis Brill, [facility]  Outpatient Specialists: cardiology    Brief Narrative:   From admission hpi Shawn Jennings is a 75 y.o. male with medical history significant for coronary artery disease status post CABG, peripheral arterial disease status post left AKA in 5176, chronic diastolic dysfunction CHF with last known LVEF of 50 to 55%, nicotine dependence, aortic stenosis status post transcatheter AVR, COPD, hypertension who presents to the emergency room for evaluation of sudden onset shortness of breath associated with left anterior chest wall pain.  He rates his pain a 6 x 10 in intensity at its worst.  Pain is nonradiating and is associated with diaphoresis.  He denies having any nausea or vomiting.  He denies having any fever or chills.  He has a cough productive of clear phlegm. He denies having any leg swelling, no palpitations, no changes in his bowel habits, no urinary symptoms, no abdominal pain, no dizziness, no lightheadedness, no headache, no blurred vision, no focal deficit.   Assessment & Plan:   Principal Problem:   Acute diastolic CHF (congestive heart failure) (HCC) Active Problems:   Chronic kidney disease, stage 3a (HCC)   Coronary artery disease of native artery of native heart with stable angina pectoris (HCC)   AF (paroxysmal atrial fibrillation) ([place])   Depression   Tobacco abuse   Type 2 diabetes mellitus with stage 3a chronic kidney disease, without long-term current use of insulin ([place])   COVID-19 virus infection   # HFrEF with exacerbation # s/p Atrial valve replacement EF 25-30 on most recent TTE. Here with some sob. CXR consistent w/ chf. Also has covid but think chf primary driver here. Bnp markedly elevated. No o2 requirement. Denies chest pain and trops are flat and mildly elevated, do not think acs - continue IV lasix 40 for home 40 po - cont home farxiga,  imdur, entresto - I/os  # Covid-19 Positive test here on 10/13. No clear symptoms. Is vaccinated. Numerous drug interactions w/ paxlovid, will hold off on that. - monitor - contact/airborne - TOC says he is cleared to return to assisted living facility tomorrow 10/15  # Recent GI bleed Egd showed gastropathy. Colonoscopy showed polyps which were resected, non-bleeding hemorrhoids. Pt reports one episode of dark/melanotic stool 2 days ago. Hgb here is stable from discharge in 9s. Is on iron at home - monitor h/h, if remains stable likely discharge w/ close outpt f/u  # paroxysmal a fib Here in sinus, normal rate - home eliquis, is not on rate control agent  # CKD 3a At baseline  # COPD Quiescent - home meds  # BKA # PAD - statin, eliquis, asa  # Hyperthyroid? - f/u tsh  # Bipolar - home lamictal, cymbalta  # BPH - home flomax     DVT prophylaxis: home eliquis Code Status: full Family Communication: pt says friend stephanie is only contact, no phone number for her  Level of care: Progressive Cardiac Status is: Inpatient  Remains inpatient appropriate because: severity of illness  Discharge to: ALF w/ home health pt/rn (orders placed)        Consultants:  none  Procedures: none  Antimicrobials:  none    Subjective: This morning breathing improved he says to baseline. No chest pain. No stooling  Objective: Vitals:   08/03/21 2120 08/04/21 0007 08/04/21 0438 08/04/21 0835  BP: 103/65 101/66 103/71 128/70  Pulse: 91 81 72 75  Resp:  20 20 18 16   Temp: 98.7 F (37.1 C) 98.6 F (37 C) 98.4 F (36.9 C) 98.2 F (36.8 C)  TempSrc: Oral Oral Oral Oral  SpO2: 95% 97% 95% 96%  Weight:      Height:        Intake/Output Summary (Last 24 hours) at 08/04/2021 0925 Last data filed at 08/04/2021 0600 Gross per 24 hour  Intake 250 ml  Output 1050 ml  Net -800 ml   Filed Weights   08/03/21 0351  Weight: 64.8 kg    Examination:  General  exam: Appears calm and comfortable  Respiratory system: Clear to auscultation. Respiratory effort normal. Cardiovascular system: S1 & S2 heard, RRR. No JVD, murmurs, rubs, gallops or clicks. No pedal edema. Gastrointestinal system: Abdomen is nondistended, soft and nontender. No organomegaly or masses felt. Normal bowel sounds heard. Central nervous system: Alert and oriented. No focal neurological deficits. Extremities: left AKA Skin: No rashes, lesions or ulcers Psychiatry: Judgement and insight appear normal. Mood & affect appropriate.     Data Reviewed: I have personally reviewed following labs and imaging studies  CBC: Recent Labs  Lab 08/03/21 0355  WBC 7.9  NEUTROABS 5.1  HGB 9.4*  HCT 29.3*  MCV 88.0  PLT 308   Basic Metabolic Panel: Recent Labs  Lab 08/03/21 0355 08/04/21 0434  NA 142 141  K 3.6 3.5  CL 112* 111  CO2 22 23  GLUCOSE 110* 125*  BUN 12 14  CREATININE 1.22 1.30*  CALCIUM 8.4* 8.5*   GFR: Estimated Creatinine Clearance: 43.6 mL/min (A) (by C-G formula based on SCr of 1.3 mg/dL (H)). Liver Function Tests: Recent Labs  Lab 08/03/21 0355  AST 12*  ALT 10  ALKPHOS 73  BILITOT 0.9  PROT 6.4*  ALBUMIN 3.2*   Recent Labs  Lab 08/03/21 0355  LIPASE 98*   No results for input(s): AMMONIA in the last 168 hours. Coagulation Profile: No results for input(s): INR, PROTIME in the last 168 hours. Cardiac Enzymes: No results for input(s): CKTOTAL, CKMB, CKMBINDEX, TROPONINI in the last 168 hours. BNP (last 3 results) No results for input(s): PROBNP in the last 8760 hours. HbA1C: No results for input(s): HGBA1C in the last 72 hours. CBG: Recent Labs  Lab 08/03/21 1236 08/03/21 2151 08/04/21 0836  GLUCAP 108* 103* 130*   Lipid Profile: No results for input(s): CHOL, HDL, LDLCALC, TRIG, CHOLHDL, LDLDIRECT in the last 72 hours. Thyroid Function Tests: No results for input(s): TSH, T4TOTAL, FREET4, T3FREE, THYROIDAB in the last 72  hours. Anemia Panel: No results for input(s): VITAMINB12, FOLATE, FERRITIN, TIBC, IRON, RETICCTPCT in the last 72 hours. Urine analysis:    Component Value Date/Time   COLORURINE STRAW (A) 07/15/2021 1839   APPEARANCEUR CLEAR (A) 07/15/2021 1839   LABSPEC 1.014 07/15/2021 1839   PHURINE 5.0 07/15/2021 1839   GLUCOSEU 150 (A) 07/15/2021 1839   HGBUR NEGATIVE 07/15/2021 1839   BILIRUBINUR NEGATIVE 07/15/2021 1839   KETONESUR NEGATIVE 07/15/2021 1839   PROTEINUR NEGATIVE 07/15/2021 1839   NITRITE NEGATIVE 07/15/2021 1839   LEUKOCYTESUR NEGATIVE 07/15/2021 1839   Sepsis Labs: @LABRCNTIP (procalcitonin:4,lacticidven:4)  ) Recent Results (from the past 240 hour(s))  Resp Panel by RT-PCR (Flu A&B, Covid) Nasopharyngeal Swab     Status: Abnormal   Collection Time: 08/03/21  4:20 AM   Specimen: Nasopharyngeal Swab; Nasopharyngeal(NP) swabs in vial transport medium  Result Value Ref Range Status   SARS Coronavirus 2 by RT PCR POSITIVE (A) NEGATIVE Final  Comment: CRITICAL RESULT CALLED TO, READ BACK BY AND VERIFIED WITH: BRANDO NORRIS@0517  08/03/21 RH (NOTE) SARS-CoV-2 target nucleic acids are DETECTED.  The SARS-CoV-2 RNA is generally detectable in upper respiratory specimens during the acute phase of infection. Positive results are indicative of the presence of the identified virus, but do not rule out bacterial infection or co-infection with other pathogens not detected by the test. Clinical correlation with patient history and other diagnostic information is necessary to determine patient infection status. The expected result is Negative.  Fact Sheet for Patients: EntrepreneurPulse.com.au  Fact Sheet for Healthcare Providers: IncredibleEmployment.be  This test is not yet approved or cleared by the [place] FDA and  has been authorized for detection and/or diagnosis of SARS-CoV-2 by FDA under an Emergency Use Authorization (EUA).   This EUA will remain in effect (meaning this test  can be used) for the duration of  the COVID-19 declaration under Section 564(b)(1) of the Act, 21 U.S.C. section 360bbb-3(b)(1), unless the authorization is terminated or revoked sooner.     Influenza A by PCR NEGATIVE NEGATIVE Final   Influenza B by PCR NEGATIVE NEGATIVE Final    Comment: (NOTE) The Xpert Xpress SARS-CoV-2/FLU/RSV plus assay is intended as an aid in the diagnosis of influenza from Nasopharyngeal swab specimens and should not be used as a sole basis for treatment. Nasal washings and aspirates are unacceptable for Xpert Xpress SARS-CoV-2/FLU/RSV testing.  Fact Sheet for Patients: EntrepreneurPulse.com.au  Fact Sheet for Healthcare Providers: IncredibleEmployment.be  This test is not yet approved or cleared by the [place] FDA and has been authorized for detection and/or diagnosis of SARS-CoV-2 by FDA under an Emergency Use Authorization (EUA). This EUA will remain in effect (meaning this test can be used) for the duration of the COVID-19 declaration under Section 564(b)(1) of the Act, 21 U.S.C. section 360bbb-3(b)(1), unless the authorization is terminated or revoked.  Performed at [facility], [address] [place], [place] [address]          Radiology Studies: CT Angio Chest PE W and/or Wo Contrast  Result Date: 08/03/2021 CLINICAL DATA:  Shortness of breath EXAM: CT ANGIOGRAPHY CHEST WITH CONTRAST TECHNIQUE: Multidetector CT imaging of the chest was performed using the standard protocol during bolus administration of intravenous contrast. Multiplanar CT image reconstructions and MIPs were obtained to evaluate the vascular anatomy. CONTRAST:  41mL OMNIPAQUE IOHEXOL 350 MG/ML SOLN COMPARISON:  None similar FINDINGS: Cardiovascular: Satisfactory opacification of the pulmonary arteries to the segmental level. No evidence of pulmonary embolism. Cardiomegaly  with transmural lateral left ventricular thinning. CABG in transcatheter aortic valve replacement. Extensive atheromatous calcification. Mediastinum/Nodes: Negative for adenopathy or mass Lungs/Pleura: Airway debris in the right lower lobe airway narrowing and lobar opacity with volume loss. Diffuse motion artifact. Mild interlobular septal thickening at the bases with small right pleural effusion. Upper Abdomen: Negative Musculoskeletal: Negative Review of the MIP images confirms the above findings. IMPRESSION: 1. Right lower lobe airway debris with atelectasis. 2. Mild interstitial edema with small right pleural effusion. 3. Negative for pulmonary embolism. 4. 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 Electronically Signed   By: Jorje Guild M.D.   On: 08/03/2021 05:46   DG Chest Portable 1 View  Result Date: 08/03/2021 CLINICAL DATA:  Chest pain EXAM: PORTABLE CHEST 1 VIEW COMPARISON:  07/15/2021 FINDINGS: Cardiomegaly and stable aortic tortuosity. Transcatheter aortic valve replacement. Diffuse interstitial opacity with Kerley lines. No visible effusion or pneumothorax. Artifact from EKG leads. Prior left mid clavicle fracture which  is healed IMPRESSION: CHF pattern. Electronically Signed   By: Jorje Guild M.D.   On: 08/03/2021 04:38   ECHOCARDIOGRAM COMPLETE  Result Date: 08/03/2021    ECHOCARDIOGRAM REPORT   Patient Name:   [facility] Date of Exam: 08/03/2021 Medical Rec #:  [id number]      Height:       72.0 in Accession #:    [id number]     Weight:       142.9 lb Date of Birth:  Jun 27, 1946      BSA:          1.847 m Patient Age:    32 years       BP:           130/81 mmHg Patient Gender: M              HR:           79 bpm. Exam Location:  [facility] Procedure: 2D Echo, Cardiac Doppler and Color Doppler Indications:     CHF-acute diastolic Z99.35  History:         Patient has prior history of Echocardiogram examinations, most                  recent 10/19/2020. CAD; Risk  Factors:Hypertension and Diabetes.                  Aortic stenosis, PAF.  Sonographer:     Sherrie Sport Referring Phys:  [id number] Diagnosing Phys: Ida Rogue MD  Sonographer Comments: Suboptimal apical window. IMPRESSIONS  1. Left ventricular ejection fraction, by estimation, is 20 to 25%. The left ventricle has severely decreased function. The left ventricle demonstrates severe global hypokinesis, anterior wall motion best preserved. The left ventricular internal cavity size was moderately dilated.  2. Right ventricular systolic function is normal. The right ventricular size is normal.  3. Left atrial size was moderately dilated.  4. The mitral valve is normal in structure. Mild mitral valve regurgitation.  5. The aortic valve was not well visualized. FINDINGS  Left Ventricle: Left ventricular ejection fraction, by estimation, is 20 to 25%. The left ventricle has severely decreased function. The left ventricle demonstrates global hypokinesis. The left ventricular internal cavity size was moderately dilated. There is no left ventricular hypertrophy. Left ventricular diastolic parameters are consistent with Grade I diastolic dysfunction (impaired relaxation). Right Ventricle: The right ventricular size is normal. No increase in right ventricular wall thickness. Right ventricular systolic function is normal. Left Atrium: Left atrial size was moderately dilated. Right Atrium: Right atrial size was normal in size. Pericardium: There is no evidence of pericardial effusion. Mitral Valve: The mitral valve is normal in structure. Mild mitral annular calcification. Mild mitral valve regurgitation. No evidence of mitral valve stenosis. MV peak gradient, 6.7 mmHg. The mean mitral valve gradient is 3.0 mmHg. Tricuspid Valve: The tricuspid valve is normal in structure. Tricuspid valve regurgitation is not demonstrated. No evidence of tricuspid stenosis. Aortic Valve: The aortic valve was not well visualized.  Aortic valve regurgitation is not visualized. No aortic stenosis is present. Aortic valve mean gradient measures 6.0 mmHg. Aortic valve peak gradient measures 11.4 mmHg. Aortic valve area, by VTI measures 1.82 cm. Pulmonic Valve: The pulmonic valve was normal in structure. Pulmonic valve regurgitation is not visualized. No evidence of pulmonic stenosis. Aorta: The aortic root is normal in size and structure. Venous: The inferior vena cava is normal in size with greater than 50% respiratory variability, suggesting right atrial  pressure of 3 mmHg. IAS/Shunts: No atrial level shunt detected by color flow Doppler.  LEFT VENTRICLE PLAX 2D LVIDd:         5.60 cm      Diastology LVIDs:         5.30 cm      LV e' medial:    2.72 cm/s LV PW:         1.80 cm      LV E/e' medial:  30.1 LV IVS:        1.20 cm      LV e' lateral:   3.59 cm/s LVOT diam:     2.20 cm      LV E/e' lateral: 22.8 LV SV:         55 LV SV Index:   30 LVOT Area:     3.80 cm  LV Volumes (MOD) LV vol d, MOD A2C: 167.0 ml LV vol d, MOD A4C: 145.0 ml LV vol s, MOD A2C: 110.0 ml LV vol s, MOD A4C: 92.1 ml LV SV MOD A2C:     57.0 ml LV SV MOD A4C:     145.0 ml LV SV MOD BP:      54.3 ml RIGHT VENTRICLE RV Basal diam:  3.00 cm RV S prime:     11.90 cm/s TAPSE (M-mode): 3.4 cm LEFT ATRIUM             Index        RIGHT ATRIUM           Index LA diam:        5.70 cm 3.09 cm/m   RA Area:     15.20 cm LA Vol (A2C):   82.1 ml 44.45 ml/m  RA Volume:   37.40 ml  20.25 ml/m LA Vol (A4C):   90.6 ml 49.06 ml/m LA Biplane Vol: 86.6 ml 46.89 ml/m  AORTIC VALVE                     PULMONIC VALVE AV Area (Vmax):    1.54 cm      PV Vmax:        0.55 m/s AV Area (Vmean):   1.65 cm      PV Peak grad:   1.2 mmHg AV Area (VTI):     1.82 cm      RVOT Peak grad: 2 mmHg AV Vmax:           168.67 cm/s AV Vmean:          111.867 cm/s AV VTI:            0.306 m AV Peak Grad:      11.4 mmHg AV Mean Grad:      6.0 mmHg LVOT Vmax:         68.50 cm/s LVOT Vmean:        48.500  cm/s LVOT VTI:          0.146 m LVOT/AV VTI ratio: 0.48  AORTA Ao Root diam: 3.40 cm MITRAL VALVE                TRICUSPID VALVE MV Area (PHT): 6.48 cm     TR Peak grad:   9.6 mmHg MV Area VTI:   1.75 cm     TR Vmax:        155.00 cm/s MV Peak grad:  6.7 mmHg MV Mean grad:  3.0 mmHg     SHUNTS MV Vmax:  1.29 m/s     Systemic VTI:  0.15 m MV Vmean:      88.9 cm/s    Systemic Diam: 2.20 cm MV Decel Time: 117 msec MV E velocity: 81.80 cm/s MV A velocity: 124.00 cm/s MV E/A ratio:  0.66 Ida Rogue MD Electronically signed by Ida Rogue MD Signature Date/Time: 08/03/2021/6:39:53 PM    Final         Scheduled Meds:  apixaban  5 mg Oral BID   vitamin C  500 mg Oral Daily   aspirin EC  81 mg Oral Daily   atorvastatin  80 mg Oral QHS   cholecalciferol  1,000 Units Oral QPM   dapagliflozin propanediol  5 mg Oral Daily   DULoxetine  30 mg Oral BID   ferrous sulfate  325 mg Oral Q breakfast   furosemide  40 mg Intravenous Daily   isosorbide mononitrate  30 mg Oral Daily   lamoTRIgine  25 mg Oral BID   loratadine  10 mg Oral Daily   melatonin  2.5 mg Oral QHS   montelukast  10 mg Oral QHS   multivitamin-lutein  1 capsule Oral BID   pantoprazole  40 mg Oral Daily   potassium chloride  20 mEq Oral Daily   sacubitril-valsartan  1 tablet Oral BID   senna  1 tablet Oral Daily   sodium chloride flush  3 mL Intravenous Q12H   tamsulosin  0.4 mg Oral Daily   vitamin B-12  1,000 mcg Oral Daily   zinc sulfate  220 mg Oral Daily   Continuous Infusions:  sodium chloride     remdesivir 100 mg in NS 100 mL 100 mg (08/04/21 0823)     LOS: 1 day    Time spent: 35 min    Desma Maxim, MD [facility]   If 7PM-7AM, please contact night-coverage [email or web address] Password [email or web address] 08/04/2021, 9:25 AM

## 2021-08-04 NOTE — Consult Note (Signed)
   Heart Failure Nurse Navigator Note  HFrEF 20 to 25%.  Severe global hypokinesis.  Normal right ventricular systolic function.  Ejection fraction had previously been reported at 50 to 55%.  He presented to the emergency room with complaints of worsening shortness of breath and chest pain.  Comorbidities:  Coronary artery disease and coronary artery bypass grafting PAD TAVR COPD Hypertension Depression Paroxysmal atrial fibrillation Continued tobacco abuse   Labs:  Sodium 141, potassium 3.5, chloride 111, CO2 23, BUN 14, creatinine 1.3.  BNP on admission was 1688,  Medications:  Eliquis 5 mg 2 times a day Aspirin 81 mg daily Atorvastatin 80 mg at bedtime Furosemide 40 mg IV daily Isosorbide mononitrate 30 mg daily   Initial meeting with patient today, he is awake and alert lying in bed having just finished his lunch.  He denies any increasing shortness of breath or swelling.  He states at the facility he does not add salt at the table to his meals.  As far as fluid intake he says he drinks 3 cans of soda daily and very little water.  He states that they do not weigh him daily explained the reasoning behind the importance of daily weights.  And early detection of weight gain to avoid hospitalizations.  Voices understanding.  He was given the living with heart failure teaching booklet along with information on low-sodium.  Made aware of his appointment in the outpatient heart failure clinic with Darylene Price on November 2 at 11 AM.  He had no further questions.  Pricilla Riffle RN [facility]

## 2021-08-05 DIAGNOSIS — I5031 Acute diastolic (congestive) heart failure: Secondary | ICD-10-CM | POA: Diagnosis not present

## 2021-08-05 LAB — BASIC METABOLIC PANEL
Anion gap: 10 (ref 5–15)
BUN: 21 mg/dL (ref 8–23)
CO2: 22 mmol/L (ref 22–32)
Calcium: 8.7 mg/dL — ABNORMAL LOW (ref 8.9–10.3)
Chloride: 108 mmol/L (ref 98–111)
Creatinine, Ser: 1.45 mg/dL — ABNORMAL HIGH (ref 0.61–1.24)
GFR, Estimated: 50 mL/min — ABNORMAL LOW (ref >60)
Glucose, Bld: 135 mg/dL — ABNORMAL HIGH (ref 70–99)
Potassium: 3.4 mmol/L — ABNORMAL LOW (ref 3.5–5.1)
Sodium: 140 mmol/L (ref 135–145)

## 2021-08-05 LAB — CBC
HCT: 33.4 % — ABNORMAL LOW (ref 39.0–52.0)
Hemoglobin: 11.1 g/dL — ABNORMAL LOW (ref 13.0–17.0)
MCH: 27.8 pg (ref 26.0–34.0)
MCHC: 33.2 g/dL (ref 30.0–36.0)
MCV: 83.7 fL (ref 80.0–100.0)
Platelets: 278 10*3/uL (ref 150–400)
RBC: 3.99 MIL/uL — ABNORMAL LOW (ref 4.22–5.81)
RDW: 16.1 % — ABNORMAL HIGH (ref 11.5–15.5)
WBC: 9.5 10*3/uL (ref 4.0–10.5)
nRBC: 0 % (ref 0.0–0.2)

## 2021-08-05 LAB — GLUCOSE, CAPILLARY
Glucose-Capillary: 158 mg/dL — ABNORMAL HIGH (ref 70–99)
Glucose-Capillary: 143 mg/dL — ABNORMAL HIGH (ref 70–99)

## 2021-08-05 NOTE — TOC Transition Note (Signed)
Transition of Care [facility]) - CM/SW Discharge Note   Patient Details  Name: Shawn Jennings MRN: [id number] Date of Birth: Jan 26, 1946  [facility] CM/SW Contact:  Alberteen Sam, LCSW Phone Number: 08/05/2021, 9:48 AM   Clinical Narrative:     Patient will DC to: [facility] Anticipated DC date: 08/05/21 Family notified: friend Advertising copywriter byJohnanna Schneiders  Per MD patient ready for DC to  [facility]. RN, patient, patient's family, and facility notified of DC. Discharge Summary sent to facility via patient in EMS per facility request, as they report their fax number has changed. They report only needing dc summary. RN given number for report  [phone]. DC packet on chart. Ambulance transport requested for patient.  CSW signing off.  Pricilla Riffle, LCSW    Final next level of care: Assisted Living Barriers to Discharge: No Barriers Identified   Patient Goals and CMS Choice Patient states their goals for this hospitalization and ongoing recovery are:: to go home CMS Medicare.gov Compare Post Acute Care list provided to:: Patient Choice offered to / list presented to : Patient  Discharge Placement              Patient chooses bed at:  [facility]) Patient to be transferred to facility by: [facility]   Patient and family notified of of transfer: 08/05/21  Discharge Plan and Services                                     Social Determinants of Health (SDOH) Interventions     Readmission Risk Interventions No flowsheet data found.

## 2021-08-05 NOTE — Progress Notes (Signed)
Pt discharged back to his [facility] via EMS transport. Report called to Joseph Art, nurse at facility. All belongings sent with patient. Discharge paperwork in packet for receiving nurse.

## 2021-08-05 NOTE — Discharge Summary (Signed)
Shawn Jennings WUJ:[id number] DOB: 05/06/1946 DOA: 08/03/2021  PCP: [facility]  Admit date: 08/03/2021 Discharge date: 08/05/2021  Time spent: 35 minutes  Recommendations for Outpatient Follow-up:  Cbc and bmp 1-2 weeks to monitor hemoglobin and electrolytes/kidney function    Discharge Diagnoses:  Principal Problem:   Acute diastolic CHF (congestive heart failure) (HCC) Active Problems:   Chronic kidney disease, stage 3a ([place])   Coronary artery disease of native artery of native heart with stable angina pectoris (HCC)   AF (paroxysmal atrial fibrillation) ([place])   Depression   Tobacco abuse   Type 2 diabetes mellitus with stage 3a chronic kidney disease, without long-term current use of insulin ([place])   COVID-19 virus infection   Discharge Condition: stable  Diet recommendation: heart healthy  Filed Weights   08/03/21 0351 08/05/21 0422  Weight: 64.8 kg 58.4 kg    History of present illness:  Shawn Jennings is a 75 y.o. male with medical history significant for coronary artery disease status post CABG, peripheral arterial disease status post left AKA in 9562, chronic diastolic dysfunction CHF with last known LVEF of 50 to 55%, nicotine dependence, aortic stenosis status post transcatheter AVR, COPD, hypertension who presents to the emergency room for evaluation of sudden onset shortness of breath associated with left anterior chest wall pain.  He rates his pain a 6 x 10 in intensity at its worst.  Pain is nonradiating and is associated with diaphoresis.  He denies having any nausea or vomiting.  He denies having any fever or chills.  He has a cough productive of clear phlegm. He denies having any leg swelling, no palpitations, no changes in his bowel habits, no urinary symptoms, no abdominal pain, no dizziness, no lightheadedness, no headache, no blurred vision, no focal deficit.  Hospital Course:  # HFrEF with exacerbation # s/p Atrial valve replacement EF  25-30 on most recent TTE. Here with some sob. CXR consistent w/ chf. Also has covid but think chf primary driver here. Bnp markedly elevated. No o2 requirement, this was a mild exacerbation. Treated with IV lasix and breathing improved to baseline. Denies chest pain and trops are flat and mildly elevated, do not think acs - resume home meds   # Covid-19 Positive test here on 10/13. No clear symptoms. Is vaccinated. Numerous drug interactions w/ paxlovid, will hold off on that. Last day of isolation 10/23   # Recent GI bleed Egd showed gastropathy. Colonoscopy showed polyps which were resected, non-bleeding hemorrhoids. Pt reports one episode of dark/melanotic stool 2 days ago. Hgb here is stable from discharge in 9s. Is on iron at home - monitor h/h as outpatient   # paroxysmal a fib Here in sinus, normal rate - home eliquis, is not on rate control agent   # CKD 3a At baseline   # COPD Quiescent - home meds   # BKA # PAD - statin, eliquis, asa   # Hyperthyroid? Noted in problem list. Tsh wnl. Not on meds   # Bipolar - home lamictal, cymbalta   # BPH - home flomax    Procedures: none   Consultations: none  Discharge Exam: Vitals:   08/05/21 0419 08/05/21 0754  BP: 121/81 124/74  Pulse: 76 79  Resp: 19 17  Temp: 98.5 F (36.9 C) 97.8 F (36.6 C)  SpO2: 95% 99%    General exam: Appears calm and comfortable  Respiratory system: Clear to auscultation. Respiratory effort normal. Cardiovascular system: S1 & S2 heard, RRR. No JVD, murmurs,  rubs, gallops or clicks. No pedal edema. Gastrointestinal system: Abdomen is nondistended, soft and nontender. No organomegaly or masses felt. Normal bowel sounds heard. Central nervous system: Alert and oriented. No focal neurological deficits. Extremities: left AKA Skin: No rashes, lesions or ulcers Psychiatry: Judgement and insight appear normal. Mood & affect appropriate.   Discharge Instructions   Discharge Instructions      Diet - low sodium heart healthy   Complete by: As directed    Face-to-face encounter (required for Medicare/Medicaid patients)   Complete by: As directed    I Desma Maxim certify that this patient is under my care and that I, or a nurse practitioner or physician's assistant working with me, had a face-to-face encounter that meets the physician face-to-face encounter requirements with this patient on 08/04/2021. The encounter with the patient was in whole, or in part for the following medical condition(s) which is the primary reason for home health care (List medical condition): left above the knee amputation   The encounter with the patient was in whole, or in part, for the following medical condition, which is the primary reason for home health care: left above the knee amputation   I certify that, based on my findings, the following services are medically necessary home health services:  Physical therapy Nursing     Reason for Medically Necessary Home Health Services:  Skilled Nursing- Skilled Assessment/Observation Therapy- Therapeutic Exercises to Increase Strength and Endurance     My clinical findings support the need for the above services: Unable to leave home safely without assistance and/or assistive device   Further, I certify that my clinical findings support that this patient is homebound due to: Unable to leave home safely without assistance   Home Health   Complete by: As directed    To provide the following care/treatments:  PT RN     Increase activity slowly   Complete by: As directed       Allergies as of 08/05/2021   No Known Allergies      Medication List     STOP taking these medications    Fluticasone-Salmeterol 250-50 MCG/DOSE Aepb Commonly known as: ADVAIR   ketotifen 0.025 % ophthalmic solution Commonly known as: ZADITOR   midodrine 5 MG tablet Commonly known as: PROAMATINE   nicotine 14 mg/24hr patch Commonly known as: NICODERM CQ - dosed in  mg/24 hours   Rivaroxaban 15 MG Tabs tablet Commonly known as: XARELTO       TAKE these medications    acetaminophen 500 MG tablet Commonly known as: TYLENOL Take 500 mg by mouth every 8 (eight) hours as needed.   apixaban 5 MG Tabs tablet Commonly known as: ELIQUIS Take 1 tablet (5 mg total) by mouth 2 (two) times daily.   aspirin EC 81 MG tablet Take 81 mg by mouth daily. Swallow whole.   atorvastatin 80 MG tablet Commonly known as: LIPITOR Take 80 mg by mouth at bedtime.   cholecalciferol 25 MCG (1000 UNIT) tablet Commonly known as: VITAMIN D3 Take 1,000 Units by mouth every evening.   dapagliflozin propanediol 5 MG Tabs tablet Commonly known as: FARXIGA Take 5 mg by mouth daily.   DULoxetine 30 MG capsule Commonly known as: CYMBALTA Take 30 mg by mouth 2 (two) times daily.   Entresto 24-26 MG Generic drug: sacubitril-valsartan Take 1 tablet by mouth 2 (two) times daily.   ferrous sulfate 325 (65 FE) MG tablet Take 325 mg by mouth daily with breakfast.   furosemide  40 MG tablet Commonly known as: LASIX Take 40 mg by mouth daily.   hydrOXYzine 25 MG tablet Commonly known as: ATARAX/VISTARIL Take 25 mg by mouth every 6 (six) hours as needed (allergy symptoms).   isosorbide mononitrate 30 MG 24 hr tablet Commonly known as: IMDUR Take 1 tablet (30 mg total) by mouth daily.   lamoTRIgine 25 MG tablet Commonly known as: LAMICTAL Take 25 mg by mouth 2 (two) times daily.   loratadine 10 MG tablet Commonly known as: CLARITIN Take 10 mg by mouth daily.   melatonin 3 MG Tabs tablet Take 1 tablet by mouth at bedtime.   montelukast 10 MG tablet Commonly known as: SINGULAIR Take 10 mg by mouth at bedtime.   nitroGLYCERIN 0.4 MG SL tablet Commonly known as: NITROSTAT Place 0.4 mg under the tongue every 5 (five) minutes as needed for chest pain.   pantoprazole 40 MG tablet Commonly known as: PROTONIX Take 40 mg by mouth daily.   potassium chloride 20  MEQ packet Commonly known as: KLOR-CON Take 20 mEq by mouth daily.   PreserVision AREDS Caps Take 1 capsule by mouth in the morning and at bedtime.   senna 8.6 MG tablet Commonly known as: SENOKOT Take 1 tablet by mouth daily.   tamsulosin 0.4 MG Caps capsule Commonly known as: FLOMAX Take 0.4 mg by mouth daily.   Ventolin HFA 108 (90 Base) MCG/ACT inhaler Generic drug: albuterol Inhale 2 puffs into the lungs every 6 (six) hours as needed for wheezing or shortness of breath.   vitamin B-12 1000 MCG tablet Commonly known as: CYANOCOBALAMIN Take 1,000 mcg by mouth daily.       No Known Allergies    The results of significant diagnostics from this hospitalization (including imaging, microbiology, ancillary and laboratory) are listed below for reference.    Significant Diagnostic Studies: CT Angio Chest PE W and/or Wo Contrast  Result Date: 08/03/2021 CLINICAL DATA:  Shortness of breath EXAM: CT ANGIOGRAPHY CHEST WITH CONTRAST TECHNIQUE: Multidetector CT imaging of the chest was performed using the standard protocol during bolus administration of intravenous contrast. Multiplanar CT image reconstructions and MIPs were obtained to evaluate the vascular anatomy. CONTRAST:  59mL OMNIPAQUE IOHEXOL 350 MG/ML SOLN COMPARISON:  None similar FINDINGS: Cardiovascular: Satisfactory opacification of the pulmonary arteries to the segmental level. No evidence of pulmonary embolism. Cardiomegaly with transmural lateral left ventricular thinning. CABG in transcatheter aortic valve replacement. Extensive atheromatous calcification. Mediastinum/Nodes: Negative for adenopathy or mass Lungs/Pleura: Airway debris in the right lower lobe airway narrowing and lobar opacity with volume loss. Diffuse motion artifact. Mild interlobular septal thickening at the bases with small right pleural effusion. Upper Abdomen: Negative Musculoskeletal: Negative Review of the MIP images confirms the above findings.  IMPRESSION: 1. Right lower lobe airway debris with atelectasis. 2. Mild interstitial edema with small right pleural effusion. 3. Negative for pulmonary embolism. 4. 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 Electronically Signed   By: Jorje Guild M.D.   On: 08/03/2021 05:46   CT ABDOMEN PELVIS W CONTRAST  Result Date: 07/15/2021 CLINICAL DATA:  Abdominal pain, acute, nonlocalized, emesis EXAM: CT ABDOMEN AND PELVIS WITH CONTRAST TECHNIQUE: Multidetector CT imaging of the abdomen and pelvis was performed using the standard protocol following bolus administration of intravenous contrast. CONTRAST:  93mL OMNIPAQUE IOHEXOL 350 MG/ML SOLN COMPARISON:  10/17/2020 FINDINGS: Lower chest: Reticulonodular opacities within the dependent portion of the right lower lobe. Prosthetic aortic valve. Coronary artery calcification. Hepatobiliary: No focal liver abnormality is identified.  Prior cholecystectomy. Resolved biliary dilatation. No residual stones are seen within the common bile duct. Pancreas: Unremarkable. No pancreatic ductal dilatation or surrounding inflammatory changes. Spleen: Normal in size without focal abnormality. Adrenals/Urinary Tract: Unremarkable adrenal glands. Calcifications within the bilateral renal hila are favored to represent vascular calcification. Subcentimeter low-density lesions within each kidney, likely cysts. No hydronephrosis. Bilateral ureters are unremarkable. Urinary bladder within normal limits. Stomach/Bowel: There is gastric wall thickening along the greater curvature. Small periampullary duodenal diverticulum is again noted. Anastomotic sutures in the left upper quadrant. No dilated loops of bowel to suggest obstruction. Normal appendix in the right lower quadrant (series 2, image 59). Mild long segment thickening of the rectosigmoid colon. No focally thickened segment. No pericolonic inflammatory changes. Vascular/Lymphatic: Advanced atherosclerotic  calcifications throughout the aortoiliac axis without aneurysm. Chronically occluded left SFA stent. No abdominopelvic lymphadenopathy. Reproductive: Prostate is unremarkable. Other: No free fluid. No abdominopelvic fluid collection. No pneumoperitoneum. No abdominal wall hernia. Musculoskeletal: No acute or significant osseous findings. IMPRESSION: 1. Gastric wall thickening along the greater curvature, which may represent gastritis. 2. Mild long segment thickening of the rectosigmoid colon may represent a nonspecific colitis. 3. Reticulonodular opacities within the dependent portion of the right lower lobe may represent an infectious or inflammatory process versus aspiration. 4. Advanced aortoiliac atherosclerosis (ICD10-I70.0). Electronically Signed   By: Davina Poke D.O.   On: 07/15/2021 12:37   DG Chest Portable 1 View  Result Date: 08/03/2021 CLINICAL DATA:  Chest pain EXAM: PORTABLE CHEST 1 VIEW COMPARISON:  07/15/2021 FINDINGS: Cardiomegaly and stable aortic tortuosity. Transcatheter aortic valve replacement. Diffuse interstitial opacity with Kerley lines. No visible effusion or pneumothorax. Artifact from EKG leads. Prior left mid clavicle fracture which is healed IMPRESSION: CHF pattern. Electronically Signed   By: Jorje Guild M.D.   On: 08/03/2021 04:38   DG Chest Port 1 View  Result Date: 07/15/2021 CLINICAL DATA:  Abdominal pain.  Nausea and vomiting. EXAM: PORTABLE CHEST 1 VIEW COMPARISON:  Radiograph earlier today. Lung bases from abdominopelvic CT earlier today. FINDINGS: No definite radiographic correlate for reticulonodular opacities in the lower right lung base on CT, there is no focal airspace disease by radiograph. Stable heart size and mediastinal contours, post median sternotomy and aortic valve replacement. Mild vascular congestion. No pleural effusion or pneumothorax. IMPRESSION: 1. Mild vascular congestion. 2. No radiographic correlate for reticulonodular opacities in the  lower right lung on abdominal CT earlier this day. Electronically Signed   By: Keith Rake M.D.   On: 07/15/2021 23:48   DG Chest Port 1 View  Result Date: 07/15/2021 CLINICAL DATA:  Nausea vomiting for 3 days.  Black emesis. EXAM: PORTABLE CHEST 1 VIEW COMPARISON:  11/07/2020. FINDINGS: Stable changes from prior cardiac surgery. No mediastinal or hilar masses. No evidence of adenopathy. Clear lungs.  No pleural effusion or pneumothorax. Skeletal structures grossly intact. Old healed left clavicle fracture. IMPRESSION: No acute cardiopulmonary disease. Electronically Signed   By: Lajean Manes M.D.   On: 07/15/2021 14:44   DG Abd Portable 1V  Result Date: 07/15/2021 CLINICAL DATA:  Abdominal pain.  Nausea and vomiting. EXAM: PORTABLE ABDOMEN - 1 VIEW COMPARISON:  Abdominopelvic CT earlier today. FINDINGS: Divided supine view of the abdomen obtained. Featureless loop of air-filled bowel in the central pelvis not seen on CT earlier today, 4.1 cm. Moderate stool in the right colon. No definite radiographic correlate for the rectosigmoid wall thickening on CT. There is excreted contrast in the urinary bladder. Advanced aortic and branch atherosclerosis.  Cholecystectomy clips in the right upper quadrant. IMPRESSION: Featureless loop of air-filled bowel in the central pelvis not seen on CT earlier today. This may represent new small bowel dilatation or gaseous distension of inflamed sigmoid colon given rectosigmoid wall thickening on earlier CT. Electronically Signed   By: Keith Rake M.D.   On: 07/15/2021 23:50   ECHOCARDIOGRAM COMPLETE  Result Date: 08/03/2021    ECHOCARDIOGRAM REPORT   Patient Name:   [facility] Date of Exam: 08/03/2021 Medical Rec #:  [id number]      Height:       72.0 in Accession #:    [id number]     Weight:       142.9 lb Date of Birth:  03-18-1946      BSA:          1.847 m Patient Age:    69 years       BP:           130/81 mmHg Patient Gender: M              HR:            79 bpm. Exam Location:  [facility] Procedure: 2D Echo, Cardiac Doppler and Color Doppler Indications:     CHF-acute diastolic Y40.34  History:         Patient has prior history of Echocardiogram examinations, most                  recent 10/19/2020. CAD; Risk Factors:Hypertension and Diabetes.                  Aortic stenosis, PAF.  Sonographer:     Sherrie Sport Referring Phys:  [id number] GLOVFIEP AGBATA Diagnosing Phys: Ida Rogue MD  Sonographer Comments: Suboptimal apical window. IMPRESSIONS  1. Left ventricular ejection fraction, by estimation, is 20 to 25%. The left ventricle has severely decreased function. The left ventricle demonstrates severe global hypokinesis, anterior wall motion best preserved. The left ventricular internal cavity size was moderately dilated.  2. Right ventricular systolic function is normal. The right ventricular size is normal.  3. Left atrial size was moderately dilated.  4. The mitral valve is normal in structure. Mild mitral valve regurgitation.  5. The aortic valve was not well visualized. FINDINGS  Left Ventricle: Left ventricular ejection fraction, by estimation, is 20 to 25%. The left ventricle has severely decreased function. The left ventricle demonstrates global hypokinesis. The left ventricular internal cavity size was moderately dilated. There is no left ventricular hypertrophy. Left ventricular diastolic parameters are consistent with Grade I diastolic dysfunction (impaired relaxation). Right Ventricle: The right ventricular size is normal. No increase in right ventricular wall thickness. Right ventricular systolic function is normal. Left Atrium: Left atrial size was moderately dilated. Right Atrium: Right atrial size was normal in size. Pericardium: There is no evidence of pericardial effusion. Mitral Valve: The mitral valve is normal in structure. Mild mitral annular calcification. Mild mitral valve regurgitation. No evidence of mitral valve stenosis. MV peak gradient, 6.7  mmHg. The mean mitral valve gradient is 3.0 mmHg. Tricuspid Valve: The tricuspid valve is normal in structure. Tricuspid valve regurgitation is not demonstrated. No evidence of tricuspid stenosis. Aortic Valve: The aortic valve was not well visualized. Aortic valve regurgitation is not visualized. No aortic stenosis is present. Aortic valve mean gradient measures 6.0 mmHg. Aortic valve peak gradient measures 11.4 mmHg. Aortic valve area, by VTI measures 1.82 cm. Pulmonic Valve: The pulmonic valve was normal in structure.  Pulmonic valve regurgitation is not visualized. No evidence of pulmonic stenosis. Aorta: The aortic root is normal in size and structure. Venous: The inferior vena cava is normal in size with greater than 50% respiratory variability, suggesting right atrial pressure of 3 mmHg. IAS/Shunts: No atrial level shunt detected by color flow Doppler.  LEFT VENTRICLE PLAX 2D LVIDd:         5.60 cm      Diastology LVIDs:         5.30 cm      LV e' medial:    2.72 cm/s LV PW:         1.80 cm      LV E/e' medial:  30.1 LV IVS:        1.20 cm      LV e' lateral:   3.59 cm/s LVOT diam:     2.20 cm      LV E/e' lateral: 22.8 LV SV:         55 LV SV Index:   30 LVOT Area:     3.80 cm  LV Volumes (MOD) LV vol d, MOD A2C: 167.0 ml LV vol d, MOD A4C: 145.0 ml LV vol s, MOD A2C: 110.0 ml LV vol s, MOD A4C: 92.1 ml LV SV MOD A2C:     57.0 ml LV SV MOD A4C:     145.0 ml LV SV MOD BP:      54.3 ml RIGHT VENTRICLE RV Basal diam:  3.00 cm RV S prime:     11.90 cm/s TAPSE (M-mode): 3.4 cm LEFT ATRIUM             Index        RIGHT ATRIUM           Index LA diam:        5.70 cm 3.09 cm/m   RA Area:     15.20 cm LA Vol (A2C):   82.1 ml 44.45 ml/m  RA Volume:   37.40 ml  20.25 ml/m LA Vol (A4C):   90.6 ml 49.06 ml/m LA Biplane Vol: 86.6 ml 46.89 ml/m  AORTIC VALVE                     PULMONIC VALVE AV Area (Vmax):    1.54 cm      PV Vmax:        0.55 m/s AV Area (Vmean):   1.65 cm      PV Peak grad:   1.2 mmHg AV Area  (VTI):     1.82 cm      RVOT Peak grad: 2 mmHg AV Vmax:           168.67 cm/s AV Vmean:          111.867 cm/s AV VTI:            0.306 m AV Peak Grad:      11.4 mmHg AV Mean Grad:      6.0 mmHg LVOT Vmax:         68.50 cm/s LVOT Vmean:        48.500 cm/s LVOT VTI:          0.146 m LVOT/AV VTI ratio: 0.48  AORTA Ao Root diam: 3.40 cm MITRAL VALVE                TRICUSPID VALVE MV Area (PHT): 6.48 cm     TR Peak grad:   9.6 mmHg MV Area VTI:  1.75 cm     TR Vmax:        155.00 cm/s MV Peak grad:  6.7 mmHg MV Mean grad:  3.0 mmHg     SHUNTS MV Vmax:       1.29 m/s     Systemic VTI:  0.15 m MV Vmean:      88.9 cm/s    Systemic Diam: 2.20 cm MV Decel Time: 117 msec MV E velocity: 81.80 cm/s MV A velocity: 124.00 cm/s MV E/A ratio:  0.66 Ida Rogue MD Electronically signed by Ida Rogue MD Signature Date/Time: 08/03/2021/6:39:53 PM    Final     Microbiology: Recent Results (from the past 240 hour(s))  Resp Panel by RT-PCR (Flu A&B, Covid) Nasopharyngeal Swab     Status: Abnormal   Collection Time: 08/03/21  4:20 AM   Specimen: Nasopharyngeal Swab; Nasopharyngeal(NP) swabs in vial transport medium  Result Value Ref Range Status   SARS Coronavirus 2 by RT PCR POSITIVE (A) NEGATIVE Final    Comment: CRITICAL RESULT CALLED TO, READ BACK BY AND VERIFIED WITH: BRANDO NORRIS@[id number]  08/03/21 RH (NOTE) SARS-CoV-2 target nucleic acids are DETECTED.  The SARS-CoV-2 RNA is generally detectable in upper respiratory specimens during the acute phase of infection. Positive results are indicative of the presence of the identified virus, but do not rule out bacterial infection or co-infection with other pathogens not detected by the test. Clinical correlation with patient history and other diagnostic information is necessary to determine patient infection status. The expected result is Negative.  Fact Sheet for Patients: EntrepreneurPulse.com.au  Fact Sheet for Healthcare  Providers: IncredibleEmployment.be  This test is not yet approved or cleared by the [place] FDA and  has been authorized for detection and/or diagnosis of SARS-CoV-2 by FDA under an Emergency Use Authorization (EUA).  This EUA will remain in effect (meaning this test  can be used) for the duration of  the COVID-19 declaration under Section 564(b)(1) of the Act, 21 U.S.C. section 360bbb-3(b)(1), unless the authorization is terminated or revoked sooner.     Influenza A by PCR NEGATIVE NEGATIVE Final   Influenza B by PCR NEGATIVE NEGATIVE Final    Comment: (NOTE) The Xpert Xpress SARS-CoV-2/FLU/RSV plus assay is intended as an aid in the diagnosis of influenza from Nasopharyngeal swab specimens and should not be used as a sole basis for treatment. Nasal washings and aspirates are unacceptable for Xpert Xpress SARS-CoV-2/FLU/RSV testing.  Fact Sheet for Patients: EntrepreneurPulse.com.au  Fact Sheet for Healthcare Providers: IncredibleEmployment.be  This test is not yet approved or cleared by the [place] FDA and has been authorized for detection and/or diagnosis of SARS-CoV-2 by FDA under an Emergency Use Authorization (EUA). This EUA will remain in effect (meaning this test can be used) for the duration of the COVID-19 declaration under Section 564(b)(1) of the Act, 21 U.S.C. section 360bbb-3(b)(1), unless the authorization is terminated or revoked.  Performed at [facility], [address] [place], [place] [address]      Labs: Basic Metabolic Panel: Recent Labs  Lab 08/03/21 0355 08/04/21 0434 08/05/21 0416  NA 142 141 140  K 3.6 3.5 3.4*  CL 112* 111 108  CO2 22 23 22   GLUCOSE 110* 125* 135*  BUN 12 14 21   CREATININE 1.22 1.30* 1.45*  CALCIUM 8.4* 8.5* 8.7*   Liver Function Tests: Recent Labs  Lab 08/03/21 0355  AST 12*  ALT 10  ALKPHOS 73  BILITOT 0.9  PROT 6.4*  ALBUMIN 3.2*  Recent Labs  Lab 08/03/21 0355  LIPASE 98*   No results for input(s): AMMONIA in the last 168 hours. CBC: Recent Labs  Lab 08/03/21 0355 08/05/21 0416  WBC 7.9 9.5  NEUTROABS 5.1  --   HGB 9.4* 11.1*  HCT 29.3* 33.4*  MCV 88.0 83.7  PLT 239 278   Cardiac Enzymes: No results for input(s): CKTOTAL, CKMB, CKMBINDEX, TROPONINI in the last 168 hours. BNP: BNP (last 3 results) Recent Labs    08/03/21 0355  BNP 1,688.3*    ProBNP (last 3 results) No results for input(s): PROBNP in the last 8760 hours.  CBG: Recent Labs  Lab 08/04/21 1120 08/04/21 1648 08/04/21 2048 08/04/21 2136 08/05/21 0753  GLUCAP 187* 128* 142* 129* 158*       Signed:  Desma Maxim MD.  [facility] 08/05/2021, 9:40 AM

## 2021-08-15 NOTE — Progress Notes (Deleted)
Cardiology Office Note    Date:  08/15/2021   ID:  Shawn Jennings, DOB [birth year], MRN [id number]  PCP:  Orvis Brill, [facility]  Cardiologist:  [name], MD  Electrophysiologist:  None   Chief Complaint: Hospital follow-up  History of Present Illness:   Shawn Jennings is a 75 y.o. male with history of CAD status post CABG in 1994 in [place], HFrEF secondary to ICM, PAF, aortic stenosis status post bioprosthetic AVR, COVID in 07/2021, CKD stage III, PAD status post left AKA, DM2, HTN, HLD, COPD, GERD, tobacco use, and depression who presents for hospital follow-up.  He has a long history of chest pain with multiple stress tests being performed over the years.  He was admitted in 01/2019 with chest pain with stress test being potentially high risk with a large, severe, fixed inferior and inferolateral defect extending to the apex.  No ischemia was noted, EF 37%.  Echo showed an EF of 40 to 45%.  He preferred to avoid cardiac cath and was medically managed.  He underwent echo in 04/2020 which demonstrated a worsening cardiomyopathy with an EF of 25 to 30%, global hypokinesis, mild LVH, grade 1 diastolic dysfunction, normal RV systolic function and ventricular cavity size, normal functioning bioprosthetic aortic valve, and borderline dilatation of the aortic root measuring 38 mm.  Subsequent R/LHC in 05/2020 showed severe underlying three-vessel CAD with patent grafts including LIMA to LAD, SVG Y graft to second diagonal and OM 3, and RIMA to RCA.  RHC showed normal filling pressures, mild pulmonary hypertension, and severely reduced cardiac output.  Medical therapy was recommended.  He underwent repeat echo in 09/2020 which showed low normal LV systolic function with an EF of 50 to 55%, no regional wall motion abnormalities, mildly dilated LV internal cavity size, normal LV diastolic function parameters, normal RV systolic function with mildly enlarged RV cavity size, mild biatrial  enlargement, trivial mitral regurgitation, and mild aortic stenosis.  He was admitted in 09/2020 with choledocholithiasis and septic shock due to ascending cholangitis and underwent ERCP with stent placement.  Hospital was notable for atrial flutter with RVR, and acute on CKD.    He was admitted in 06/2021 with an upper GI bleed with a hemoglobin of 7.1, requiring 2 units of packed red blood cells.  EGD showed erosive gastropathy with no acute bleeding.  Colonoscopy showed nonbleeding hemorrhoids.  Aspirin was discontinued.  GI advised to the patient could resume Eliquis on 07/22/2021.  Entresto, Imdur, and carvedilol were discontinued due to hypotension requiring midodrine.  He was most recently admitted to the hospital from 10/13 through 10/15 with shortness of breath and left sided chest discomfort.  He was noted to be COVID-positive, largely asymptomatic and noted to be vaccinated.  He was treated for CHF exacerbation by the primary team with IV Lasix and symptomatic improvement.  High-sensitivity troponin was minimally elevated and flat trending peaking at 22.  BNP 1688.  Echo again demonstrated a worsening LV systolic function with an EF of 20 to 25%, severe global hypokinesis with the anterior wall having the past preservation of motion, moderately dilated LV internal cavity size, normal RV systolic function and ventricular cavity size, moderately dilated left atrium, mild mitral regurgitation, and poorly visualized aortic valve.  CTA chest was negative for PE with right lower lobe airway debris with atelectasis along with mild interstitial edema and a small right pleural effusion.  Hemoglobin remained low, though is stable as outlined below.  At time of discharge,  midodrine was discontinued.  With regards to his cardiomyopathy he was advised to take Lisabeth Register, Lasix 40 mg daily, and Imdur.  ***   Labs independently reviewed: 07/2021 - HGB 11.1, PLT 278, potassium 3.4, BUN 21, SCr 1.45, TSH  normal, albumin 3.2, AST/ALT not elevated 06/2021 - magnesium 1.8, A1c 6.5 01/2019 - TC 143, TG 152, HDL 51, LDL 62  Past Medical History:  Diagnosis Date   Aortic stenosis    a. Pt unaware of history but CT imaging 01/2019 consistent w/ AVR; b. 01/2019 Echo: Mild to mod AS. Mean grad 7mmHg.   CAD (coronary artery disease)    a. 1994 s/p CABG ([place]); b. Reports multiple stress tests over the years w/o repeat cath; c. 01/2019 MV: large, sev, fixed inf and inflat defect extending to apex. No ischemia. EF 37%-->Med rx given atypical Ss and pt wishes.   CKD (chronic kidney disease), stage III (HCC)    COPD (chronic obstructive pulmonary disease) (HCC)    Depression    Essential hypertension    GERD (gastroesophageal reflux disease)    Hyperlipidemia LDL goal <70    Ischemic cardiomyopathy    a. 01/2019 Echo: EF 40-45%, impaired relaxation. Mildly dil LA. Sev mitral annular Ca2+. Mild to mod AS.   PAD (peripheral artery disease) ([place])    a. 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   PAF (paroxysmal atrial fibrillation) (HCC)    a. CHA2DS2VASc = 4-->Xarelto 15mg  daily in setting of CKD.   Thyrotoxicosis    Tobacco abuse    Type II diabetes mellitus ([place])     Past Surgical History:  Procedure Laterality Date   CARDIAC CATHETERIZATION     CHOLECYSTECTOMY     COLONOSCOPY     COLONOSCOPY WITH PROPOFOL N/A 07/20/2021   Procedure: COLONOSCOPY WITH PROPOFOL;  Surgeon: Lin Landsman, MD;  Location: [facility] ENDOSCOPY;  Service: Gastroenterology;  Laterality: N/A;   CORONARY ARTERY BYPASS GRAFT     a. [place]   ENDOSCOPIC RETROGRADE CHOLANGIOPANCREATOGRAPHY (ERCP) WITH PROPOFOL N/A 10/19/2020   Procedure: ENDOSCOPIC RETROGRADE CHOLANGIOPANCREATOGRAPHY (ERCP) WITH PROPOFOL;  Surgeon: [name], MD;  Location: [facility] ENDOSCOPY;  Service: Endoscopy;  Laterality: N/A;   ERCP N/A 12/29/2020   Procedure: ENDOSCOPIC RETROGRADE CHOLANGIOPANCREATOGRAPHY (ERCP);  Surgeon: [name], MD;  Location: [facility]  ENDOSCOPY;  Service: Endoscopy;  Laterality: N/A;   ESOPHAGOGASTRODUODENOSCOPY (EGD) WITH PROPOFOL N/A 07/18/2021   Procedure: ESOPHAGOGASTRODUODENOSCOPY (EGD) WITH PROPOFOL;  Surgeon: Lin Landsman, MD;  Location: [facility] ENDOSCOPY;  Service: Gastroenterology;  Laterality: N/A;   Left AKA     RIGHT/LEFT HEART CATH AND CORONARY ANGIOGRAPHY N/A 06/06/2020   Procedure: RIGHT/LEFT HEART CATH AND CORONARY ANGIOGRAPHY;  Surgeon: Wellington Hampshire, MD;  Location: [place] [facility];  Service: Cardiovascular;  Laterality: N/A;    Current Medications: No outpatient medications have been marked as taking for the 08/16/21 encounter (Appointment) with Rise Mu, PA-C.    Allergies:   Patient has no known allergies.   Social History   Socioeconomic History   Marital status: Single    Spouse name: Not on file   Number of children: Not on file   Years of education: Not on file   Highest education level: Not on file  Occupational History   Not on file  Tobacco Use   Smoking status: Every Day    Packs/day: 1.00    Years: 62.00    Pack years: 62.00    Types: Cigarettes   Smokeless tobacco: Never  Vaping  Use   Vaping Use: Never used  Substance and Sexual Activity   Alcohol use: Never   Drug use: Never   Sexual activity: Not on file  Other Topics Concern   Not on file  Social History Narrative   Retired from [employer].  From [place], [place]. Moved to [place] ~ 2011 (pt initially said that he moved here in Feb 2020, but then said 9 yrs ago).   Social Determinants of Health   Financial Resource Strain: Not on file  Food Insecurity: Not on file  Transportation Needs: Not on file  Physical Activity: Not on file  Stress: Not on file  Social Connections: Not on file     Family History:  The patient's family history includes CAD in his sister; Heart attack in his mother; Heart disease in his brother; Other in his father, sister, and sister.  ROS:   ROS   EKGs/Labs/Other Studies  Reviewed:    Studies reviewed were summarized above. The additional studies were reviewed today:  2D echo 07/2021: 1. Left ventricular ejection fraction, by estimation, is 20 to 25%. The  left ventricle has severely decreased function. The left ventricle  demonstrates severe global hypokinesis, anterior wall motion best  preserved. The left ventricular internal cavity  size was moderately dilated.   2. Right ventricular systolic function is normal. The right ventricular  size is normal.   3. Left atrial size was moderately dilated.   4. The mitral valve is normal in structure. Mild mitral valve  regurgitation.   5. The aortic valve was not well visualized. __________  2D echo 09/2020: 1. Left ventricular ejection fraction, by estimation, is 50 to 55%. The  left ventricle has low normal function. The left ventricle has no regional  wall motion abnormalities. The left ventricular internal cavity size was  mildly dilated. Left ventricular  diastolic parameters were normal.   2. Right ventricular systolic function is normal. The right ventricular  size is mildly enlarged.   3. Left atrial size was mildly dilated.   4. Right atrial size was mildly dilated.   5. The mitral valve was not well visualized. Trivial mitral valve  regurgitation.   6. The aortic valve was not well visualized. Aortic valve regurgitation  is trivial. Mild aortic valve stenosis.  __________  [facility] 05/2020: Prox Cx to Mid Cx lesion is 100% stenosed. Mid LAD lesion is 100% stenosed. Prox RCA lesion is 100% stenosed. LIMA graft was visualized by non-selective angiography and is normal in caliber. The graft exhibits no disease. RIMA graft was visualized by non-selective angiography and is normal in caliber. The graft exhibits no disease.   1.  Severe underlying three-vessel coronary artery disease with patent grafts including LIMA to LAD, SVG Y graft to second diagonal and OM 3 and RIMA to RCA. 2.  Left  ventricular angiography was not performed.  EF was severely reduced by echo. 3.  Right heart catheterization showed normal filling pressures, minimal pulmonary hypertension and severely reduced cardiac output.   Recommendations: Continue medical therapy for chronic systolic heart failure. No need for any revascularization. Continue aggressive treatment of risk factors. Xarelto can be resumed tomorrow. __________  2D echo 04/2020: 1. Left ventricular ejection fraction, by estimation, is 25 to 30%. The  left ventricle has severely decreased function. The left ventricle  demonstrates global hypokinesis. There is mild left ventricular  hypertrophy. Left ventricular diastolic parameters   are consistent with Grade I diastolic dysfunction (impaired relaxation).   2. Right ventricular systolic  function is normal. The right ventricular  size is normal.   3. The mitral valve is degenerative. No evidence of mitral valve  regurgitation.   4. The aortic valve has been repaired/replaced. Aortic valve  regurgitation is not visualized. There is a a bioprosthetic valve present  in the aortic position. Echo findings are consistent with normal structure  and function of the aortic valve  prosthesis.   5. Aortic dilatation noted. There is borderline dilatation of the aortic  root measuring 38 mm.   Comparison(s): 01/26/19 TTE reported a 40% LVEF __________  [facility] 01/2019: Abnormal, potentially high risk pharmacologic myocardial perfusion stress test. There is a large in size, severe, fixed inferior and inferolateral defect extending to the apex consistent with scar. No significant ischemia is identified, though sensititivity is reduced by the extent of prior infarct. The left ventricular ejection fraction is moderately to severely reduced (LVEF 20% by QGS and 37% by Siemens calculation). LVEF noted to be 40-45% on yesterday's echocardiogram. Attenuation correction CT is notable for post-CABG and  AVR findings. The gallbladder is surgically absent. __________  2D echo 01/2019: 1. The left ventricle has mild-moderately reduced systolic function, with  an ejection fraction of 40-45%. The cavity size was mildly dilated. Left  ventricular diastolic Doppler parameters are consistent with impaired  relaxation. The study is not  adequate for wall motion abnormalities.   2. The right ventricle has normal systolic function. The cavity was  normal. There is no increase in right ventricular wall thickness.   3. Left atrial size was mildly dilated.   4. The mitral valve is degenerative. Moderate calcification of the mitral  valve leaflet. There is severe mitral annular calcification present.   5. The tricuspid valve is grossly normal.   6. The aortic valve is tricuspid. Moderate calcification of the aortic  valve. Aortic valve regurgitation was not assessed by color flow Doppler  mild-moderate stenosis of the aortic valve. Mean gradient of 13 mm Hg.   7. No pulmonic valve vegetation visualized.    EKG:  EKG is ordered today.  The EKG ordered today demonstrates ***  Recent Labs: 07/20/2021: Magnesium 1.8 08/03/2021: ALT 10; B Natriuretic Peptide 1,688.3 08/04/2021: TSH 0.428 08/05/2021: BUN 21; Creatinine, Ser 1.45; Hemoglobin 11.1; Platelets 278; Potassium 3.4; Sodium 140  Recent Lipid Panel    Component Value Date/Time   CHOL 143 01/27/2019 0237   TRIG 152 (H) 01/27/2019 0237   HDL 51 01/27/2019 0237   CHOLHDL 2.8 01/27/2019 0237   VLDL 30 01/27/2019 0237   LDLCALC 62 01/27/2019 0237    PHYSICAL EXAM:    VS:  There were no vitals taken for this visit.  BMI: There is no height or weight on file to calculate BMI.  Physical Exam  Wt Readings from Last 3 Encounters:  08/05/21 128 lb 12 oz (58.4 kg)  07/15/21 147 lb 11.3 oz (67 kg)  02/16/21 134 lb (60.8 kg)     ASSESSMENT & PLAN:   CAD status post CABG:  HFrEF secondary to ICM:  PAF:  CKD stage III:  HTN: Blood  pressure ***  HLD: LDL 62 in 01/2019.  Recent COVID infection:  COPD:  Disposition: F/u with Dr. Fletcher Anon or an APP in ***.   Medication Adjustments/Labs and Tests Ordered: Current medicines are reviewed at length with the patient today.  Concerns regarding medicines are outlined above. Medication changes, Labs and Tests ordered today are summarized above and listed in the Patient Instructions accessible in Encounters.  Signed, Christell Faith, PA-C 08/15/2021 8:50 AM     [facility] [place] [address] [place], [place] [address] [phone]

## 2021-08-16 ENCOUNTER — Ambulatory Visit: Payer: Medicare Other | Admitting: Physician Assistant

## 2021-08-22 NOTE — Progress Notes (Signed)
Patient ID: Shawn Jennings, male    DOB: [birth year], 75 y.o.   MRN: [id number]  HPI  Shawn Jennings is a 75 y/o male with a history of CAD, DM, hyperlipidemia, HTN, CKD, thyroid disease, COPD, depression, GERD, PAF, PAD, current tobacco use and chronic heart failure.   Echo report from 08/03/21 reviewed and showed an EF of 20-25% along with mild Shawn.   RHC/LHC done 06/06/20 and showed: Prox Cx to Mid Cx lesion is 100% stenosed. Mid LAD lesion is 100% stenosed. Prox RCA lesion is 100% stenosed. LIMA graft was visualized by non-selective angiography and is normal in caliber. The graft exhibits no disease. RIMA graft was visualized by non-selective angiography and is normal in caliber. The graft exhibits no disease.  Admitted 08/03/21 due to shortness of breath and chest pain. Given IV lasix initially and then transitioned to oral diuretics. Tested covid +. Discharged after 2 days.   He presents today for his initial visit with a chief complaint of minimal fatigue. He says that this has been present for several years. He has associated dizziness when bending over at the waist along with this. He denies any difficulty sleeping, abdominal distention, palpitations, pedal edema, chest pain, shortness of breath or cough.   Getting weighed weekly at [facility].   Not adding salt but says that the food tastes salty to him.   Past Medical History:  Diagnosis Date   Aortic stenosis    a. Pt unaware of history but CT imaging 01/2019 consistent w/ AVR; b. 01/2019 Echo: Mild to mod AS. Mean grad 24mmHg.   CAD (coronary artery disease)    a. 1994 s/p CABG ([place]); b. Reports multiple stress tests over the years w/o repeat cath; c. 01/2019 MV: large, sev, fixed inf and inflat defect extending to apex. No ischemia. EF 37%-->Med rx given atypical Ss and pt wishes.   CHF (congestive heart failure) (HCC)    CKD (chronic kidney disease), stage III (HCC)    COPD (chronic obstructive pulmonary disease)  (HCC)    Depression    Essential hypertension    GERD (gastroesophageal reflux disease)    Hyperlipidemia LDL goal <70    Ischemic cardiomyopathy    a. 01/2019 Echo: EF 40-45%, impaired relaxation. Mildly dil LA. Sev mitral annular Ca2+. Mild to mod AS.   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   PAF (paroxysmal atrial fibrillation) (HCC)    a. CHA2DS2VASc = 4-->Xarelto 15mg  daily in setting of CKD.   Thyrotoxicosis    Tobacco abuse    Type II diabetes mellitus ([place])    Past Surgical History:  Procedure Laterality Date   CARDIAC CATHETERIZATION     CHOLECYSTECTOMY     COLONOSCOPY     COLONOSCOPY WITH PROPOFOL N/A 07/20/2021   Procedure: COLONOSCOPY WITH PROPOFOL;  Surgeon: Lin Landsman, MD;  Location: [facility] ENDOSCOPY;  Service: Gastroenterology;  Laterality: N/A;   CORONARY ARTERY BYPASS GRAFT     a. [place]   ENDOSCOPIC RETROGRADE CHOLANGIOPANCREATOGRAPHY (ERCP) WITH PROPOFOL N/A 10/19/2020   Procedure: ENDOSCOPIC RETROGRADE CHOLANGIOPANCREATOGRAPHY (ERCP) WITH PROPOFOL;  Surgeon: [name], MD;  Location: [facility] ENDOSCOPY;  Service: Endoscopy;  Laterality: N/A;   ERCP N/A 12/29/2020   Procedure: ENDOSCOPIC RETROGRADE CHOLANGIOPANCREATOGRAPHY (ERCP);  Surgeon: [name], MD;  Location: [facility] ENDOSCOPY;  Service: Endoscopy;  Laterality: N/A;   ESOPHAGOGASTRODUODENOSCOPY (EGD) WITH PROPOFOL N/A 07/18/2021   Procedure: ESOPHAGOGASTRODUODENOSCOPY (EGD) WITH PROPOFOL;  Surgeon: Lin Landsman,  MD;  Location: [facility] ENDOSCOPY;  Service: Gastroenterology;  Laterality: N/A;   Left AKA     RIGHT/LEFT HEART CATH AND CORONARY ANGIOGRAPHY N/A 06/06/2020   Procedure: RIGHT/LEFT HEART CATH AND CORONARY ANGIOGRAPHY;  Surgeon: Wellington Hampshire, MD;  Location: [place] [facility];  Service: Cardiovascular;  Laterality: N/A;   Family History  Problem Relation Age of Onset   Heart attack Mother        died @ 64.   Other Father        never knew his  father.   Other Sister        overdose of sleeping pills.   Heart disease Brother        died in his 20's   Other Sister        complication of abd surgery @ 57   CAD Sister    Social History   Tobacco Use   Smoking status: Every Day    Packs/day: 1.00    Years: 62.00    Pack years: 62.00    Types: Cigarettes   Smokeless tobacco: Never  Substance Use Topics   Alcohol use: Never   No Known Allergies  Prior to Admission medications   Medication Sig Start Date End Date Taking? Authorizing Provider  acetaminophen (TYLENOL) 500 MG tablet Take 500 mg by mouth every 8 (eight) hours as needed.   Yes [provider]  albuterol (VENTOLIN HFA) 108 (90 Base) MCG/ACT inhaler Inhale 2 puffs into the lungs every 6 (six) hours as needed for wheezing or shortness of breath.    Yes [provider]  apixaban (ELIQUIS) 5 MG TABS tablet Take 1 tablet (5 mg total) by mouth 2 (two) times daily. 07/22/21  Yes Wieting, Richard, MD  atorvastatin (LIPITOR) 80 MG tablet Take 80 mg by mouth at bedtime.   Yes [provider]  cholecalciferol (VITAMIN D3) 25 MCG (1000 UNIT) tablet Take 1,000 Units by mouth every evening.   Yes [provider]  dapagliflozin propanediol (FARXIGA) 5 MG TABS tablet Take 5 mg by mouth daily.   Yes [provider]  DULoxetine (CYMBALTA) 30 MG capsule Take 30 mg by mouth 2 (two) times daily.   Yes [provider]  hydrOXYzine (ATARAX/VISTARIL) 25 MG tablet Take 25 mg by mouth every 6 (six) hours as needed (allergy symptoms).   Yes [provider]  isosorbide mononitrate (IMDUR) 30 MG 24 hr tablet Take 1 tablet (30 mg total) by mouth daily. 03/11/20 05/27/29 Yes Sharen Hones, MD  lamoTRIgine (LAMICTAL) 25 MG tablet Take 25 mg by mouth 2 (two) times daily.   Yes [provider]  loratadine (CLARITIN) 10 MG tablet Take 10 mg by mouth daily.   Yes [provider]  Melatonin 3 MG TABS Take 1 tablet by mouth at  bedtime.   Yes [provider]  montelukast (SINGULAIR) 10 MG tablet Take 10 mg by mouth at bedtime.   Yes [provider]  Multiple Vitamins-Minerals (PRESERVISION AREDS) CAPS Take 1 capsule by mouth in the morning and at bedtime.   Yes [provider]  nitroGLYCERIN (NITROSTAT) 0.4 MG SL tablet Place 0.4 mg under the tongue every 5 (five) minutes as needed for chest pain.   Yes [provider]  pantoprazole (PROTONIX) 40 MG tablet Take 40 mg by mouth daily.   Yes [provider]  sacubitril-valsartan (ENTRESTO) 24-26 MG Take 1 tablet by mouth 2 (two) times daily.   Yes [provider]  vitamin B-12 (  CYANOCOBALAMIN) 1000 MCG tablet Take 1,000 mcg by mouth daily.   Yes [provider]  aspirin EC 81 MG tablet Take 81 mg by mouth daily. Swallow whole. Patient not taking: Reported on 08/23/2021    [provider]  ferrous sulfate 325 (65 FE) MG tablet Take 325 mg by mouth daily with breakfast. Patient not taking: Reported on 08/23/2021    [provider]  furosemide (LASIX) 40 MG tablet Take 40 mg by mouth daily. Patient not taking: Reported on 08/23/2021    [provider]  potassium chloride (KLOR-CON) 20 MEQ packet Take 20 mEq by mouth daily. Patient not taking: Reported on 08/23/2021    [provider]  senna (SENOKOT) 8.6 MG tablet Take 1 tablet by mouth daily. Patient not taking: Reported on 08/23/2021    [provider]  tamsulosin (FLOMAX) 0.4 MG CAPS capsule Take 0.4 mg by mouth daily. Patient not taking: Reported on 08/23/2021    [provider]   Review of Systems  Constitutional:  Positive for fatigue ("very little"). Negative for appetite change.  HENT:  Negative for congestion, postnasal drip and sore throat.   Eyes: Negative.   Respiratory:  Negative for cough, chest tightness and shortness of breath.   Cardiovascular:  Negative for chest pain, palpitations and leg  swelling.  Gastrointestinal:  Negative for abdominal distention and abdominal pain.  Endocrine: Negative.   Genitourinary: Negative.   Musculoskeletal:  Negative for back pain and neck pain.  Skin: Negative.   Allergic/Immunologic: Negative.   Neurological:  Positive for dizziness (when bending over at the waist). Negative for light-headedness.  Hematological:  Negative for adenopathy. Does not bruise/bleed easily.  Psychiatric/Behavioral:  Negative for dysphoric mood and sleep disturbance (sleeping on 2 pillows). The patient is not nervous/anxious.    Vitals:   08/23/21 1100  BP: 121/76  Pulse: 80  Resp: 16  SpO2: 100%   Wt Readings from Last 3 Encounters:  08/05/21 128 lb 12 oz (58.4 kg)  07/15/21 147 lb 11.3 oz (67 kg)  02/16/21 134 lb (60.8 kg)   Lab Results  Component Value Date   CREATININE 1.45 (H) 08/05/2021   CREATININE 1.30 (H) 08/04/2021   CREATININE 1.22 08/03/2021    Physical Exam Vitals and nursing note reviewed.  Constitutional:      Appearance: Normal appearance.  HENT:     Head: Normocephalic and atraumatic.  Cardiovascular:     Rate and Rhythm: Normal rate. Rhythm irregular.  Pulmonary:     Effort: Pulmonary effort is normal. No respiratory distress.     Breath sounds: No wheezing or rales.  Abdominal:     General: There is no distension.     Palpations: Abdomen is soft.     Tenderness: There is no abdominal tenderness.  Musculoskeletal:        General: Deformity (left AKA) present.     Cervical back: Normal range of motion and neck supple.     Right lower leg: No edema.  Skin:    General: Skin is warm and dry.  Neurological:     General: No focal deficit present.     Mental Status: He is alert and oriented to person, place, and time.  Psychiatric:        Mood and Affect: Mood normal.        Behavior: Behavior normal.        Thought Content: Thought content normal.    Assessment & Plan:  1: Chronic heart failure with reduced  ejection  fraction- - NYHA class II - euvolemic today - getting weighed monthly so as order was written for daily weighing and to call for an overnight weight gain of > 2 pounds or a weekly weight gain of >5 pounds - not adding salt but he says that the food tastes salty to him; order written for a low sodium diet - on GDMT of farxiga & entresto - has been on carvedilol in the past but unclear as to why he's no longer on it so order written for carvedilol 3.125mg  BID - encouraged slow position changes when bending over at the waist - BNP 08/03/21 was 1688.3  2: HTN- - BP looks good (121/76) - PCP is [facility] - BMP 08/05/21 reviewed and showed sodium 140, potassium 3.4, creatinine 1.45 and GFR 50  3: DM- - A1c 07/15/21 was 6.5%  4: Atrial fibrillation- - cardiology Gilford Rile) 02/16/21; returns 09/01/21 - currently taking apixaban - starting carvedilol per above  5: Tobacco use- - smoking 6 cigarettes daily & has been smoking since the age of 34 - no desire to quit - denies alcohol or drug use   Facility medication list reviewed.   Return in 1 month or sooner for any questions/problems before then

## 2021-08-23 ENCOUNTER — Ambulatory Visit: Payer: Medicare Other | Attending: Family | Admitting: Family

## 2021-08-23 ENCOUNTER — Encounter: Payer: Self-pay | Admitting: Family

## 2021-08-23 ENCOUNTER — Other Ambulatory Visit: Payer: Self-pay | Admitting: Family

## 2021-08-23 ENCOUNTER — Other Ambulatory Visit: Payer: Self-pay

## 2021-08-23 VITALS — BP 121/76 | HR 80 | Resp 16

## 2021-08-23 DIAGNOSIS — Z7984 Long term (current) use of oral hypoglycemic drugs: Secondary | ICD-10-CM | POA: Insufficient documentation

## 2021-08-23 DIAGNOSIS — I2510 Atherosclerotic heart disease of native coronary artery without angina pectoris: Secondary | ICD-10-CM | POA: Insufficient documentation

## 2021-08-23 DIAGNOSIS — E079 Disorder of thyroid, unspecified: Secondary | ICD-10-CM | POA: Insufficient documentation

## 2021-08-23 DIAGNOSIS — K219 Gastro-esophageal reflux disease without esophagitis: Secondary | ICD-10-CM | POA: Diagnosis not present

## 2021-08-23 DIAGNOSIS — Z8616 Personal history of COVID-19: Secondary | ICD-10-CM | POA: Diagnosis not present

## 2021-08-23 DIAGNOSIS — I340 Nonrheumatic mitral (valve) insufficiency: Secondary | ICD-10-CM | POA: Insufficient documentation

## 2021-08-23 DIAGNOSIS — I130 Hypertensive heart and chronic kidney disease with heart failure and stage 1 through stage 4 chronic kidney disease, or unspecified chronic kidney disease: Secondary | ICD-10-CM | POA: Insufficient documentation

## 2021-08-23 DIAGNOSIS — I10 Essential (primary) hypertension: Secondary | ICD-10-CM

## 2021-08-23 DIAGNOSIS — F32A Depression, unspecified: Secondary | ICD-10-CM | POA: Insufficient documentation

## 2021-08-23 DIAGNOSIS — Z7982 Long term (current) use of aspirin: Secondary | ICD-10-CM | POA: Insufficient documentation

## 2021-08-23 DIAGNOSIS — J449 Chronic obstructive pulmonary disease, unspecified: Secondary | ICD-10-CM | POA: Diagnosis not present

## 2021-08-23 DIAGNOSIS — I5022 Chronic systolic (congestive) heart failure: Secondary | ICD-10-CM | POA: Diagnosis not present

## 2021-08-23 DIAGNOSIS — Z8249 Family history of ischemic heart disease and other diseases of the circulatory system: Secondary | ICD-10-CM | POA: Diagnosis not present

## 2021-08-23 DIAGNOSIS — E785 Hyperlipidemia, unspecified: Secondary | ICD-10-CM | POA: Diagnosis not present

## 2021-08-23 DIAGNOSIS — Z7901 Long term (current) use of anticoagulants: Secondary | ICD-10-CM | POA: Insufficient documentation

## 2021-08-23 DIAGNOSIS — E1122 Type 2 diabetes mellitus with diabetic chronic kidney disease: Secondary | ICD-10-CM | POA: Diagnosis not present

## 2021-08-23 DIAGNOSIS — N183 Chronic kidney disease, stage 3 unspecified: Secondary | ICD-10-CM | POA: Insufficient documentation

## 2021-08-23 DIAGNOSIS — Z79899 Other long term (current) drug therapy: Secondary | ICD-10-CM | POA: Diagnosis not present

## 2021-08-23 DIAGNOSIS — Z720 Tobacco use: Secondary | ICD-10-CM

## 2021-08-23 DIAGNOSIS — Z89612 Acquired absence of left leg above knee: Secondary | ICD-10-CM | POA: Diagnosis not present

## 2021-08-23 DIAGNOSIS — Z951 Presence of aortocoronary bypass graft: Secondary | ICD-10-CM | POA: Diagnosis not present

## 2021-08-23 DIAGNOSIS — I480 Paroxysmal atrial fibrillation: Secondary | ICD-10-CM | POA: Diagnosis not present

## 2021-08-23 DIAGNOSIS — E1151 Type 2 diabetes mellitus with diabetic peripheral angiopathy without gangrene: Secondary | ICD-10-CM | POA: Diagnosis not present

## 2021-08-23 DIAGNOSIS — N1831 Chronic kidney disease, stage 3a: Secondary | ICD-10-CM

## 2021-08-23 DIAGNOSIS — F17210 Nicotine dependence, cigarettes, uncomplicated: Secondary | ICD-10-CM | POA: Diagnosis not present

## 2021-08-23 MED ORDER — CARVEDILOL 3.125 MG PO TABS: 3.1250 mg | ORAL_TABLET | Freq: Two times a day (BID) | ORAL | 3 refills | Status: DC

## 2021-08-23 NOTE — Progress Notes (Signed)
Carvedilol RX sent to [facility] per [facility] request

## 2021-08-23 NOTE — Patient Instructions (Addendum)
Begin weighing daily and call for an overnight weight gain of > 2 pounds or a weekly weight gain of >5 pounds.    Start carvedilol 3.125mg  as 1 tablet in the morning and 1 tablet in the evening

## 2021-08-29 NOTE — Progress Notes (Deleted)
Cardiology Office Note    Date:  08/29/2021   ID:  Shawn Jennings, DOB 1946-02-15, MRN [id number]  PCP:  Orvis Brill, [facility]  Cardiologist:  [name], MD  Electrophysiologist:  None   Chief Complaint: Hospital follow-up  History of Present Illness:   Shawn Jennings is a 75 y.o. male with history of CAD status post CABG in 1994 in [place], HFrEF secondary to ICM, PAF, aortic stenosis status post bioprosthetic AVR, COVID in 07/2021, CKD stage III, PAD status post left AKA, DM2, HTN, HLD, COPD, GERD, tobacco use, and depression who presents for hospital follow-up.  He has a long history of chest pain with multiple stress tests being performed over the years.  He was admitted in 01/2019 with chest pain with stress test being potentially high risk with a large, severe, fixed inferior and inferolateral defect extending to the apex.  No ischemia was noted, EF 37%.  Echo showed an EF of 40 to 45%.  He preferred to avoid cardiac cath and was medically managed.  He underwent echo in 04/2020 which demonstrated a worsening cardiomyopathy with an EF of 25 to 30%, global hypokinesis, mild LVH, grade 1 diastolic dysfunction, normal RV systolic function and ventricular cavity size, normal functioning bioprosthetic aortic valve, and borderline dilatation of the aortic root measuring 38 mm.  Subsequent R/LHC in 05/2020 showed severe underlying three-vessel CAD with patent grafts including LIMA to LAD, SVG Y graft to second diagonal and OM 3, and RIMA to RCA.  RHC showed normal filling pressures, mild pulmonary hypertension, and severely reduced cardiac output.  Medical therapy was recommended.  He underwent repeat echo in 09/2020 which showed low normal LV systolic function with an EF of 50 to 55%, no regional wall motion abnormalities, mildly dilated LV internal cavity size, normal LV diastolic function parameters, normal RV systolic function with mildly enlarged RV cavity size, mild biatrial  enlargement, trivial mitral regurgitation, and mild aortic stenosis.  He was admitted in 09/2020 with choledocholithiasis and septic shock due to ascending cholangitis and underwent ERCP with stent placement.  Hospital was notable for atrial flutter with RVR, and acute on CKD.    He was admitted in 06/2021 with an upper GI bleed with a hemoglobin of 7.1, requiring 2 units of packed red blood cells.  EGD showed erosive gastropathy with no acute bleeding.  Colonoscopy showed nonbleeding hemorrhoids.  Aspirin was discontinued.  GI advised to the patient could resume Eliquis on 07/22/2021.  Entresto, Imdur, and carvedilol were discontinued due to hypotension requiring midodrine.  He was most recently admitted to the hospital from 10/13 through 10/15 with shortness of breath and left sided chest discomfort.  He was noted to be COVID-positive, largely asymptomatic and noted to be vaccinated.  He was treated for CHF exacerbation by the primary team with IV Lasix and symptomatic improvement.  High-sensitivity troponin was minimally elevated and flat trending peaking at 22.  BNP 1688.  Echo again demonstrated a worsening LV systolic function with an EF of 20 to 25%, severe global hypokinesis with the anterior wall having the past preservation of motion, moderately dilated LV internal cavity size, normal RV systolic function and ventricular cavity size, moderately dilated left atrium, mild mitral regurgitation, and poorly visualized aortic valve.  CTA chest was negative for PE with right lower lobe airway debris with atelectasis along with mild interstitial edema and a small right pleural effusion.  Hemoglobin remained low, though is stable as outlined below.  At time of discharge,  midodrine was discontinued.  With regards to his cardiomyopathy he was advised to take Lisabeth Register, Lasix 40 mg daily, and Imdur.  ***   Labs independently reviewed: 07/2021 - HGB 11.1, PLT 278, potassium 3.4, BUN 21, SCr 1.45, TSH  normal, albumin 3.2, AST/ALT not elevated 06/2021 - magnesium 1.8, A1c 6.5 01/2019 - TC 143, TG 152, HDL 51, LDL 62    Past Medical History:  Diagnosis Date   Aortic stenosis    a. Pt unaware of history but CT imaging 01/2019 consistent w/ AVR; b. 01/2019 Echo: Mild to mod AS. Mean grad 53mmHg.   CAD (coronary artery disease)    a. 1994 s/p CABG ([place]); b. Reports multiple stress tests over the years w/o repeat cath; c. 01/2019 MV: large, sev, fixed inf and inflat defect extending to apex. No ischemia. EF 37%-->Med rx given atypical Ss and pt wishes.   CHF (congestive heart failure) (HCC)    CKD (chronic kidney disease), stage III (HCC)    COPD (chronic obstructive pulmonary disease) (HCC)    Depression    Essential hypertension    GERD (gastroesophageal reflux disease)    Hyperlipidemia LDL goal <70    Ischemic cardiomyopathy    a. 01/2019 Echo: EF 40-45%, impaired relaxation. Mildly dil LA. Sev mitral annular Ca2+. Mild to mod AS.   PAD (peripheral artery disease) ([place])    a. 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   PAF (paroxysmal atrial fibrillation) (HCC)    a. CHA2DS2VASc = 4-->Xarelto 15mg  daily in setting of CKD.   Thyrotoxicosis    Tobacco abuse    Type II diabetes mellitus ([place])     Past Surgical History:  Procedure Laterality Date   CARDIAC CATHETERIZATION     CHOLECYSTECTOMY     COLONOSCOPY     COLONOSCOPY WITH PROPOFOL N/A 07/20/2021   Procedure: COLONOSCOPY WITH PROPOFOL;  Surgeon: Lin Landsman, MD;  Location: [facility] ENDOSCOPY;  Service: Gastroenterology;  Laterality: N/A;   CORONARY ARTERY BYPASS GRAFT     a. [place]   ENDOSCOPIC RETROGRADE CHOLANGIOPANCREATOGRAPHY (ERCP) WITH PROPOFOL N/A 10/19/2020   Procedure: ENDOSCOPIC RETROGRADE CHOLANGIOPANCREATOGRAPHY (ERCP) WITH PROPOFOL;  Surgeon: [name], MD;  Location: [facility] ENDOSCOPY;  Service: Endoscopy;  Laterality: N/A;   ERCP N/A 12/29/2020   Procedure: ENDOSCOPIC RETROGRADE CHOLANGIOPANCREATOGRAPHY (ERCP);   Surgeon: [name], MD;  Location: [facility] ENDOSCOPY;  Service: Endoscopy;  Laterality: N/A;   ESOPHAGOGASTRODUODENOSCOPY (EGD) WITH PROPOFOL N/A 07/18/2021   Procedure: ESOPHAGOGASTRODUODENOSCOPY (EGD) WITH PROPOFOL;  Surgeon: Lin Landsman, MD;  Location: [facility] ENDOSCOPY;  Service: Gastroenterology;  Laterality: N/A;   Left AKA     RIGHT/LEFT HEART CATH AND CORONARY ANGIOGRAPHY N/A 06/06/2020   Procedure: RIGHT/LEFT HEART CATH AND CORONARY ANGIOGRAPHY;  Surgeon: Wellington Hampshire, MD;  Location: [facility] LAB;  Service: Cardiovascular;  Laterality: N/A;    Current Medications: No outpatient medications have been marked as taking for the 09/01/21 encounter (Appointment) with Rise Mu, PA-C.    Allergies:   Patient has no known allergies.   Social History   Socioeconomic History   Marital status: Single    Spouse name: Not on file   Number of children: Not on file   Years of education: Not on file   Highest education level: Not on file  Occupational History   Not on file  Tobacco Use   Smoking status: Every Day    Packs/day: 1.00    Years: 62.00    Pack years: 62.00  Types: Cigarettes   Smokeless tobacco: Never  Vaping Use   Vaping Use: Never used  Substance and Sexual Activity   Alcohol use: Never   Drug use: Never   Sexual activity: Not on file  Other Topics Concern   Not on file  Social History Narrative   Retired from [employer].  From [place], [place]. Moved to [place] ~ 2011 (pt initially said that he moved here in Feb 2020, but then said 9 yrs ago).   Social Determinants of Health   Financial Resource Strain: Not on file  Food Insecurity: Not on file  Transportation Needs: Not on file  Physical Activity: Not on file  Stress: Not on file  Social Connections: Not on file     Family History:  The patient's family history includes CAD in his sister; Heart attack in his mother; Heart disease in his brother; Other in his father, sister, and  sister.  ROS:   ROS   EKGs/Labs/Other Studies Reviewed:    Studies reviewed were summarized above. The additional studies were reviewed today:  2D echo 07/2021: 1. Left ventricular ejection fraction, by estimation, is 20 to 25%. The  left ventricle has severely decreased function. The left ventricle  demonstrates severe global hypokinesis, anterior wall motion best  preserved. The left ventricular internal cavity  size was moderately dilated.   2. Right ventricular systolic function is normal. The right ventricular  size is normal.   3. Left atrial size was moderately dilated.   4. The mitral valve is normal in structure. Mild mitral valve  regurgitation.   5. The aortic valve was not well visualized. __________  2D echo 09/2020: 1. Left ventricular ejection fraction, by estimation, is 50 to 55%. The  left ventricle has low normal function. The left ventricle has no regional  wall motion abnormalities. The left ventricular internal cavity size was  mildly dilated. Left ventricular  diastolic parameters were normal.   2. Right ventricular systolic function is normal. The right ventricular  size is mildly enlarged.   3. Left atrial size was mildly dilated.   4. Right atrial size was mildly dilated.   5. The mitral valve was not well visualized. Trivial mitral valve  regurgitation.   6. The aortic valve was not well visualized. Aortic valve regurgitation  is trivial. Mild aortic valve stenosis.  __________  [facility] [place] 05/2020: Prox Cx to Mid Cx lesion is 100% stenosed. Mid LAD lesion is 100% stenosed. Prox RCA lesion is 100% stenosed. LIMA graft was visualized by non-selective angiography and is normal in caliber. The graft exhibits no disease. RIMA graft was visualized by non-selective angiography and is normal in caliber. The graft exhibits no disease.   1.  Severe underlying three-vessel coronary artery disease with patent grafts including LIMA to LAD, SVG Y graft to second  diagonal and OM 3 and RIMA to RCA. 2.  Left ventricular angiography was not performed.  EF was severely reduced by echo. 3.  Right heart catheterization showed normal filling pressures, minimal pulmonary hypertension and severely reduced cardiac output.   Recommendations: Continue medical therapy for chronic systolic heart failure. No need for any revascularization. Continue aggressive treatment of risk factors. Xarelto can be resumed tomorrow. __________  2D echo 04/2020: 1. Left ventricular ejection fraction, by estimation, is 25 to 30%. The  left ventricle has severely decreased function. The left ventricle  demonstrates global hypokinesis. There is mild left ventricular  hypertrophy. Left ventricular diastolic parameters   are consistent with Grade I diastolic  dysfunction (impaired relaxation).   2. Right ventricular systolic function is normal. The right ventricular  size is normal.   3. The mitral valve is degenerative. No evidence of mitral valve  regurgitation.   4. The aortic valve has been repaired/replaced. Aortic valve  regurgitation is not visualized. There is a a bioprosthetic valve present  in the aortic position. Echo findings are consistent with normal structure  and function of the aortic valve  prosthesis.   5. Aortic dilatation noted. There is borderline dilatation of the aortic  root measuring 38 mm.   Comparison(s): 01/26/19 TTE reported a 40% LVEF __________  [facility] 01/2019: Abnormal, potentially high risk pharmacologic myocardial perfusion stress test. There is a large in size, severe, fixed inferior and inferolateral defect extending to the apex consistent with scar. No significant ischemia is identified, though sensititivity is reduced by the extent of prior infarct. The left ventricular ejection fraction is moderately to severely reduced (LVEF 20% by QGS and 37% by Siemens calculation). LVEF noted to be 40-45% on yesterday's  echocardiogram. Attenuation correction CT is notable for post-CABG and AVR findings. The gallbladder is surgically absent. __________  2D echo 01/2019: 1. The left ventricle has mild-moderately reduced systolic function, with  an ejection fraction of 40-45%. The cavity size was mildly dilated. Left  ventricular diastolic Doppler parameters are consistent with impaired  relaxation. The study is not  adequate for wall motion abnormalities.   2. The right ventricle has normal systolic function. The cavity was  normal. There is no increase in right ventricular wall thickness.   3. Left atrial size was mildly dilated.   4. The mitral valve is degenerative. Moderate calcification of the mitral  valve leaflet. There is severe mitral annular calcification present.   5. The tricuspid valve is grossly normal.   6. The aortic valve is tricuspid. Moderate calcification of the aortic  valve. Aortic valve regurgitation was not assessed by color flow Doppler  mild-moderate stenosis of the aortic valve. Mean gradient of 13 mm Hg.   7. No pulmonic valve vegetation visualized.   EKG:  EKG is ordered today.  The EKG ordered today demonstrates ***  Recent Labs: 07/20/2021: Magnesium 1.8 08/03/2021: ALT 10; B Natriuretic Peptide 1,688.3 08/04/2021: TSH 0.428 08/05/2021: BUN 21; Creatinine, Ser 1.45; Hemoglobin 11.1; Platelets 278; Potassium 3.4; Sodium 140  Recent Lipid Panel    Component Value Date/Time   CHOL 143 01/27/2019 0237   TRIG 152 (H) 01/27/2019 0237   HDL 51 01/27/2019 0237   CHOLHDL 2.8 01/27/2019 0237   VLDL 30 01/27/2019 0237   LDLCALC 62 01/27/2019 0237    PHYSICAL EXAM:    VS:  There were no vitals taken for this visit.  BMI: There is no height or weight on file to calculate BMI.  Physical Exam  Wt Readings from Last 3 Encounters:  08/05/21 128 lb 12 oz (58.4 kg)  07/15/21 147 lb 11.3 oz (67 kg)  02/16/21 134 lb (60.8 kg)     ASSESSMENT & PLAN:   CAD status post  CABG:  HFrEF secondary to ICM:  PAF:  CKD stage III:  HTN: Blood pressure ***  HLD: LDL 62 in 01/2019.  Recent COVID infection:  COPD:  Disposition: F/u with Dr. Fletcher Anon or an APP in ***.   Medication Adjustments/Labs and Tests Ordered: Current medicines are reviewed at length with the patient today.  Concerns regarding medicines are outlined above. Medication changes, Labs and Tests ordered today are summarized above and listed  in the Patient Instructions accessible in Encounters.   Signed, Christell Faith, PA-C 08/29/2021 1:03 PM     [address] [place], [place] [id number] [phone]

## 2021-09-01 ENCOUNTER — Ambulatory Visit: Payer: Medicare Other | Admitting: Physician Assistant

## 2021-09-04 ENCOUNTER — Encounter: Payer: Self-pay | Admitting: Physician Assistant

## 2021-09-21 NOTE — Progress Notes (Signed)
Patient ID: Shawn Jennings, male    DOB: November 26, 1945, 75 y.o.   MRN: [id number]  HPI  Mr Plotts is a 75 y/o male with a history of CAD, DM, hyperlipidemia, HTN, CKD, thyroid disease, COPD, depression, GERD, PAF, PAD, current tobacco use and chronic heart failure.   Echo report from 08/03/21 reviewed and showed an EF of 20-25% along with mild MR.   RHC/LHC done 06/06/20 and showed: Prox Cx to Mid Cx lesion is 100% stenosed. Mid LAD lesion is 100% stenosed. Prox RCA lesion is 100% stenosed. LIMA graft was visualized by non-selective angiography and is normal in caliber. The graft exhibits no disease. RIMA graft was visualized by non-selective angiography and is normal in caliber. The graft exhibits no disease.  Admitted 08/03/21 due to shortness of breath and chest pain. Given IV lasix initially and then transitioned to oral diuretics. Tested covid +. Discharged after 2 days.   He presents today for a follow-up visit with a chief complaint of minimal fatigue with moderate exertion. He describes this as chronic in nature having been present for "awhile". He voices no other symptoms and specifically denies any difficulty sleeping, dizziness, abdominal distention, palpitations, pedal edema, chest pain, shortness of breath, cough or weight gain.   Getting weighed daily at [facility] per the paperwork they sent with him.  Did miss his cardiology appointment last month.   Past Medical History:  Diagnosis Date   Aortic stenosis    a. Pt unaware of history but CT imaging 01/2019 consistent w/ AVR; b. 01/2019 Echo: Mild to mod AS. Mean grad 81mmHg.   CAD (coronary artery disease)    a. 1994 s/p CABG ([place]); b. Reports multiple stress tests over the years w/o repeat cath; c. 01/2019 MV: large, sev, fixed inf and inflat defect extending to apex. No ischemia. EF 37%-->Med rx given atypical Ss and pt wishes.   CHF (congestive heart failure) (HCC)    CKD (chronic kidney disease), stage III  (HCC)    COPD (chronic obstructive pulmonary disease) (HCC)    Depression    Essential hypertension    GERD (gastroesophageal reflux disease)    Hyperlipidemia LDL goal <70    Ischemic cardiomyopathy    a. 01/2019 Echo: EF 40-45%, impaired relaxation. Mildly dil LA. Sev mitral annular Ca2+. Mild to mod AS.   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   PAF (paroxysmal atrial fibrillation) (HCC)    a. CHA2DS2VASc = 4-->Xarelto 15mg  daily in setting of CKD.   Thyrotoxicosis    Tobacco abuse    Type II diabetes mellitus ([place])    Past Surgical History:  Procedure Laterality Date   CARDIAC CATHETERIZATION     CHOLECYSTECTOMY     COLONOSCOPY     COLONOSCOPY WITH PROPOFOL N/A 07/20/2021   Procedure: COLONOSCOPY WITH PROPOFOL;  Surgeon: Lin Landsman, MD;  Location: [facility] ENDOSCOPY;  Service: Gastroenterology;  Laterality: N/A;   CORONARY ARTERY BYPASS GRAFT     a. [place]   ENDOSCOPIC RETROGRADE CHOLANGIOPANCREATOGRAPHY (ERCP) WITH PROPOFOL N/A 10/19/2020   Procedure: ENDOSCOPIC RETROGRADE CHOLANGIOPANCREATOGRAPHY (ERCP) WITH PROPOFOL;  Surgeon: [name], MD;  Location: [facility] ENDOSCOPY;  Service: Endoscopy;  Laterality: N/A;   ERCP N/A 12/29/2020   Procedure: ENDOSCOPIC RETROGRADE CHOLANGIOPANCREATOGRAPHY (ERCP);  Surgeon: [name], MD;  Location: [facility] ENDOSCOPY;  Service: Endoscopy;  Laterality: N/A;   ESOPHAGOGASTRODUODENOSCOPY (EGD) WITH PROPOFOL N/A 07/18/2021   Procedure: ESOPHAGOGASTRODUODENOSCOPY (EGD) WITH PROPOFOL;  Surgeon: Marius Ditch,  Tally Due, MD;  Location: [place];  Service: Gastroenterology;  Laterality: N/A;   Left AKA     RIGHT/LEFT HEART CATH AND CORONARY ANGIOGRAPHY N/A 06/06/2020   Procedure: RIGHT/LEFT HEART CATH AND CORONARY ANGIOGRAPHY;  Surgeon: Wellington Hampshire, MD;  Location: [facility];  Service: Cardiovascular;  Laterality: N/A;   Family History  Problem Relation Age of Onset   Heart attack Mother         died @ 49.   Other Father        never knew his father.   Other Sister        overdose of sleeping pills.   Heart disease Brother        died in his 51's   Other Sister        complication of abd surgery @ 38   CAD Sister    Social History   Tobacco Use   Smoking status: Every Day    Packs/day: 1.00    Years: 62.00    Pack years: 62.00    Types: Cigarettes   Smokeless tobacco: Never  Substance Use Topics   Alcohol use: Never   No Known Allergies  Prior to Admission medications   Medication Sig Start Date End Date Taking? Authorizing Provider  acetaminophen (TYLENOL) 500 MG tablet Take 500 mg by mouth every 8 (eight) hours as needed.   Yes [provider]  albuterol (VENTOLIN HFA) 108 (90 Base) MCG/ACT inhaler Inhale 2 puffs into the lungs every 6 (six) hours as needed for wheezing or shortness of breath.    Yes [provider]  apixaban (ELIQUIS) 5 MG TABS tablet Take 1 tablet (5 mg total) by mouth 2 (two) times daily. 07/22/21  Yes Wieting, Richard, MD  atorvastatin (LIPITOR) 80 MG tablet Take 80 mg by mouth at bedtime.   Yes [provider]  carvedilol (COREG) 3.125 MG tablet Take 1 tablet (3.125 mg total) by mouth 2 (two) times daily. 08/23/21 11/21/21 Yes Kyrianna Barletta, Otila Kluver A, FNP  cholecalciferol (VITAMIN D3) 25 MCG (1000 UNIT) tablet Take 1,000 Units by mouth every evening.   Yes [provider]  dapagliflozin propanediol (FARXIGA) 5 MG TABS tablet Take 5 mg by mouth daily.   Yes [provider]  DULoxetine (CYMBALTA) 30 MG capsule Take 30 mg by mouth 2 (two) times daily.   Yes [provider]  hydrOXYzine (ATARAX/VISTARIL) 25 MG tablet Take 25 mg by mouth every 6 (six) hours as needed (allergy symptoms).   Yes [provider]  isosorbide mononitrate (IMDUR) 30 MG 24 hr tablet Take 1 tablet (30 mg total) by mouth daily. 03/11/20 05/27/29 Yes Sharen Hones, MD  lamoTRIgine (LAMICTAL) 25 MG tablet Take 25 mg by mouth 2 (two)  times daily.   Yes [provider]  loratadine (CLARITIN) 10 MG tablet Take 10 mg by mouth daily.   Yes [provider]  Melatonin 3 MG TABS Take 1 tablet by mouth at bedtime.   Yes [provider]  montelukast (SINGULAIR) 10 MG tablet Take 10 mg by mouth at bedtime.   Yes [provider]  Multiple Vitamins-Minerals (PRESERVISION AREDS) CAPS Take 1 capsule by mouth in the morning and at bedtime.   Yes [provider]  nitroGLYCERIN (NITROSTAT) 0.4 MG SL tablet Place 0.4 mg under the tongue every 5 (five) minutes as needed for chest pain.   Yes [provider]  pantoprazole (PROTONIX) 40 MG tablet Take 40 mg by mouth daily.  Yes [provider]  sacubitril-valsartan (ENTRESTO) 24-26 MG Take 1 tablet by mouth 2 (two) times daily.   Yes [provider]  vitamin B-12 (CYANOCOBALAMIN) 1000 MCG tablet Take 1,000 mcg by mouth daily.   Yes [provider]  aspirin EC 81 MG tablet Take 81 mg by mouth daily. Swallow whole. Patient not taking: Reported on 08/23/2021    [provider]  ferrous sulfate 325 (65 FE) MG tablet Take 325 mg by mouth daily with breakfast. Patient not taking: Reported on 08/23/2021    [provider]  furosemide (LASIX) 40 MG tablet Take 40 mg by mouth daily. Patient not taking: Reported on 08/23/2021    [provider]  potassium chloride (KLOR-CON) 20 MEQ packet Take 20 mEq by mouth daily. Patient not taking: Reported on 08/23/2021    [provider]  senna (SENOKOT) 8.6 MG tablet Take 1 tablet by mouth daily. Patient not taking: Reported on 08/23/2021    [provider]  tamsulosin (FLOMAX) 0.4 MG CAPS capsule Take 0.4 mg by mouth daily. Patient not taking: Reported on 08/23/2021    [provider]    Review of Systems  Constitutional:  Positive for fatigue ("very little"). Negative for appetite change.  HENT:  Negative for congestion, postnasal  drip and sore throat.   Eyes: Negative.   Respiratory:  Negative for cough, chest tightness and shortness of breath.   Cardiovascular:  Negative for chest pain, palpitations and leg swelling.  Gastrointestinal:  Negative for abdominal distention and abdominal pain.  Endocrine: Negative.   Genitourinary: Negative.   Musculoskeletal:  Negative for back pain and neck pain.  Skin: Negative.   Allergic/Immunologic: Negative.   Neurological:  Negative for dizziness and light-headedness.  Hematological:  Negative for adenopathy. Does not bruise/bleed easily.  Psychiatric/Behavioral:  Negative for dysphoric mood and sleep disturbance (sleeping on 2 pillows). The patient is not nervous/anxious.    Vitals:   09/22/21 0946  BP: 127/78  Pulse: 68  Resp: 16  SpO2: 92%  Height: 6' (1.829 m)   Wt Readings from Last 3 Encounters:  08/05/21 128 lb 12 oz (58.4 kg)  07/15/21 147 lb 11.3 oz (67 kg)  02/16/21 134 lb (60.8 kg)   Lab Results  Component Value Date   CREATININE 1.45 (H) 08/05/2021   CREATININE 1.30 (H) 08/04/2021   CREATININE 1.22 08/03/2021   Physical Exam Vitals and nursing note reviewed.  Constitutional:      Appearance: Normal appearance.  HENT:     Head: Normocephalic and atraumatic.  Cardiovascular:     Rate and Rhythm: Normal rate. Rhythm irregular.  Pulmonary:     Effort: Pulmonary effort is normal. No respiratory distress.     Breath sounds: No wheezing or rales.  Abdominal:     General: There is no distension.     Palpations: Abdomen is soft.     Tenderness: There is no abdominal tenderness.  Musculoskeletal:        General: Deformity (left AKA) present.     Cervical back: Normal range of motion and neck supple.     Right lower leg: No edema.  Skin:    General: Skin is warm and dry.  Neurological:     General: No focal deficit present.     Mental Status: He is alert and oriented to person, place, and time.  Psychiatric:        Mood and Affect: Mood normal.         Behavior: Behavior  normal.        Thought Content: Thought content normal.    Assessment & Plan:  1: Chronic heart failure with reduced ejection fraction- - NYHA class II - euvolemic today - getting weighed daily and to call for an overnight weight gain of > 2 pounds or a weekly weight gain of >5 pounds - weight chart reviewed and shows weight ranges from 135-137 pounds over the last 3 weeks - did not want to stand on the scale here today - not adding salt but he says that the food tastes salty to him - on GDMT of farxiga, carvedilol & entresto - carvedilol started at last visit - consider adding MRA and having lab work drawn at the group home - encouraged slow position changes when bending over at the waist - BNP 08/03/21 was 1688.3  2: HTN- - BP looks good today (127/78) - PCP is [facility] - BMP 08/05/21 reviewed and showed sodium 140, potassium 3.4, creatinine 1.45 and GFR 50  3: DM- - A1c 07/15/21 was 6.5% - nonfasting glucose in clinic was 170  4: Atrial fibrillation- - cardiology Gilford Rile) 02/16/21 but NS on 09/01/21; this was r/s for 10/03/21 - currently taking apixaban  5: Tobacco use- - smoking 6 cigarettes daily & has been smoking since the age of 19 - no desire to quit - denies alcohol or drug use   Facility medication list reviewed.   Return in 3 months or sooner for any questions/problems before then.

## 2021-09-22 ENCOUNTER — Other Ambulatory Visit: Payer: Self-pay

## 2021-09-22 ENCOUNTER — Ambulatory Visit: Payer: Medicare Other | Attending: Family | Admitting: Family

## 2021-09-22 ENCOUNTER — Encounter: Payer: Self-pay | Admitting: Family

## 2021-09-22 VITALS — BP 127/78 | HR 68 | Resp 16 | Ht 72.0 in

## 2021-09-22 DIAGNOSIS — I130 Hypertensive heart and chronic kidney disease with heart failure and stage 1 through stage 4 chronic kidney disease, or unspecified chronic kidney disease: Secondary | ICD-10-CM | POA: Diagnosis not present

## 2021-09-22 DIAGNOSIS — E079 Disorder of thyroid, unspecified: Secondary | ICD-10-CM | POA: Diagnosis not present

## 2021-09-22 DIAGNOSIS — K219 Gastro-esophageal reflux disease without esophagitis: Secondary | ICD-10-CM | POA: Insufficient documentation

## 2021-09-22 DIAGNOSIS — I480 Paroxysmal atrial fibrillation: Secondary | ICD-10-CM | POA: Diagnosis not present

## 2021-09-22 DIAGNOSIS — E785 Hyperlipidemia, unspecified: Secondary | ICD-10-CM | POA: Insufficient documentation

## 2021-09-22 DIAGNOSIS — I10 Essential (primary) hypertension: Secondary | ICD-10-CM | POA: Diagnosis not present

## 2021-09-22 DIAGNOSIS — N1831 Chronic kidney disease, stage 3a: Secondary | ICD-10-CM

## 2021-09-22 DIAGNOSIS — Z09 Encounter for follow-up examination after completed treatment for conditions other than malignant neoplasm: Secondary | ICD-10-CM | POA: Insufficient documentation

## 2021-09-22 DIAGNOSIS — R5383 Other fatigue: Secondary | ICD-10-CM | POA: Insufficient documentation

## 2021-09-22 DIAGNOSIS — F32A Depression, unspecified: Secondary | ICD-10-CM | POA: Diagnosis not present

## 2021-09-22 DIAGNOSIS — E1122 Type 2 diabetes mellitus with diabetic chronic kidney disease: Secondary | ICD-10-CM | POA: Diagnosis not present

## 2021-09-22 DIAGNOSIS — Z7901 Long term (current) use of anticoagulants: Secondary | ICD-10-CM | POA: Insufficient documentation

## 2021-09-22 DIAGNOSIS — I5022 Chronic systolic (congestive) heart failure: Secondary | ICD-10-CM | POA: Insufficient documentation

## 2021-09-22 DIAGNOSIS — F17210 Nicotine dependence, cigarettes, uncomplicated: Secondary | ICD-10-CM | POA: Insufficient documentation

## 2021-09-22 DIAGNOSIS — I2510 Atherosclerotic heart disease of native coronary artery without angina pectoris: Secondary | ICD-10-CM | POA: Diagnosis not present

## 2021-09-22 DIAGNOSIS — Z8616 Personal history of COVID-19: Secondary | ICD-10-CM | POA: Insufficient documentation

## 2021-09-22 DIAGNOSIS — J449 Chronic obstructive pulmonary disease, unspecified: Secondary | ICD-10-CM | POA: Insufficient documentation

## 2021-09-22 DIAGNOSIS — Z720 Tobacco use: Secondary | ICD-10-CM

## 2021-09-22 LAB — GLUCOSE, CAPILLARY: Glucose-Capillary: 170 mg/dL — ABNORMAL HIGH (ref 70–99)

## 2021-09-22 NOTE — Patient Instructions (Addendum)
Begin weighing daily and call for an overnight weight gain of 3 pounds or more or a weekly weight gain of more than 5 pounds.  

## 2021-10-02 NOTE — Progress Notes (Deleted)
Cardiology Office Note    Date:  10/02/2021   ID:  Shawn Jennings, DOB 06/10/1946, MRN [id number]  PCP:  [facility]  Cardiologist:  [name], MD  Electrophysiologist:  None   Chief Complaint: Hospital follow-up  History of Present Illness:   Shawn Jennings is a 75 y.o. male with history of CAD status post CABG in [place], [place] in 1994 with chronic chest pain, A. fib on Xarelto, HFrEF secondary to ICM, aortic stenosis status post bioprosthetic AVR, DM2, HTN, HLD, GI bleed in 06/2021 requiring 2 units packed red blood cell transfusion, COPD with ongoing tobacco use, PAD status post left AKA, CKD stage III, GERD, and depression who presents for hospital follow-up as outlined below.  He was admitted to the hospital in 01/2019 with atypical chest pain and ruled out for an MI.  Echo at that time showed an EF of 40 to 45% with mild aortic stenosis.  Lexiscan MPI demonstrated a large scar in the inferior and inferolateral area without ischemia.  LVEF 37%.  He was felt to be a poor candidate for cardiac cath, though this was offered to him, and he declined.  He was medically managed.  He was admitted in 02/2020 with recurrent atypical chest pain that was worse with deep breathing.  Troponins were normal.  Echo was recommended, though not performed.  Barium esophagram showed a small hiatal hernia.  When he was seen in follow-up in 03/2020 he subsequently underwent the above recommended echo in 04/2020 which showed a worsening cardiomyopathy with an EF of 25 to 30%, global hypokinesis, grade 1 diastolic dysfunction, normal RV systolic function and ventricular cavity size, prior bioprosthetic AVR with normal structure and function, and borderline dilatation of the aortic root measuring 38 mm.  Subsequent R/LHC in 05/2020 showed severe underlying three-vessel CAD with patent grafts including LIMA to LAD, SVG Y graft to second diagonal and OM 3 and RIMA to RCA.  RHC showed normal filling  pressures, minimal pulmonary hypertension, and severely reduced cardiac output.  Medical therapy was recommended.  He admitted to the hospital in 09/2020 with septic shock secondary to choledocholithiasis and a sending cholangitis.  He underwent ERCP with stent placement.  Hospitalization was notable for atrial flutter with RVR an acute on CKD.  Echo during that admission showed an improved LV systolic function with an EF of 50 to 55%, no regional wall motion abnormalities, mildly dilated internal LV cavity size, normal LV diastolic function parameters, normal RV systolic function with mildly enlarged RV cavity size, mild biatrial enlargement, trivial mitral regurgitation, and a poorly visualized aortic valve with mild stenosis noted.  He underwent repeat ERCP in 12/2020 with stent removal.  He was seen in the ED in 10/2020 with atypical chest pain with normal troponins.  He was last seen in our office in 01/2021 and was overall doing reasonably well from a cardiac perspective.  He was admitted in 06/2021 with an upper GI bleed with acute blood loss anemia requiring 2 units PRBC during his admission.  EGD demonstrated erosive gastropathy with no acute bleeding.  Colonoscopy showed nonbleeding hemorrhoids with 4 polyps being removed.  Overall, poor prep was noted.  Documentation indicates GI cleared resumption of Eliquis.  Aspirin was held at discharge.  Due to hypotension, Entresto, carvedilol, and Imdur were discontinued and he was placed on midodrine.  Admission was also notable for AKI.  He was admitted to the hospital in 07/2021 with shortness of breath felt to be secondary to  HFrEF and COVID exacerbated by ongoing anemia following recent GI bleed 1 month prior.  High-sensitivity troponin minimally elevated and flat trending peaking at 22.  BNP 1688.  He was treated by primary service with IV Lasix with improvement in symptoms.  Due to numerous medication interactions, Paxlovid was not prescribed.  Echo  during this admission showed a worsening cardiomyopathy with an EF of 20 to 25%, severe global hypokinesis with anterior wall motion best preserved, moderately dilated internal LV cavity size, normal RV systolic function and ventricular cavity size, moderately dilated left atrium, mild mitral regurgitation, and a poorly visualized aortic valve.  Cardiology was not consulted during this admission.  He has since been followed by the [facility] CHF clinic, last seeing them on 09/22/2021.  The have maintained him on carvedilol, Entresto, and Farxiga.  He did not want to stand on the scale at his last visit.  ***   Labs independently reviewed: 07/2021 - Hgb 11.1, PLT 278, potassium 3.4, BUN 21, serum creatinine 1.45, TSH normal, albumin 3.2, AST/ALT not elevated 06/2021 - magnesium 1.8, A1c 6.5 01/2019 - TC 143, TG 152, HDL 51, LDL 62  Past Medical History:  Diagnosis Date   Aortic stenosis    a. Pt unaware of history but CT imaging 01/2019 consistent w/ AVR; b. 01/2019 Echo: Mild to mod AS. Mean grad 80mmHg.   CAD (coronary artery disease)    a. 1994 s/p CABG ([place]); b. Reports multiple stress tests over the years w/o repeat cath; c. 01/2019 MV: large, sev, fixed inf and inflat defect extending to apex. No ischemia. EF 37%-->Med rx given atypical Ss and pt wishes.   CHF (congestive heart failure) (HCC)    CKD (chronic kidney disease), stage III (HCC)    COPD (chronic obstructive pulmonary disease) (HCC)    Depression    Essential hypertension    GERD (gastroesophageal reflux disease)    Hyperlipidemia LDL goal <70    Ischemic cardiomyopathy    a. 01/2019 Echo: EF 40-45%, impaired relaxation. Mildly dil LA. Sev mitral annular Ca2+. Mild to mod AS.   PAD (peripheral artery disease) ([place])    a. 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   PAF (paroxysmal atrial fibrillation) (HCC)    a. CHA2DS2VASc = 4-->Xarelto 15mg  daily in setting of CKD.   Thyrotoxicosis    Tobacco abuse    Type II diabetes mellitus ([place])      Past Surgical History:  Procedure Laterality Date   CARDIAC CATHETERIZATION     CHOLECYSTECTOMY     COLONOSCOPY     COLONOSCOPY WITH PROPOFOL N/A 07/20/2021   Procedure: COLONOSCOPY WITH PROPOFOL;  Surgeon: Lin Landsman, MD;  Location[facility] ENDOSCOPY;  Service: Gastroenterology;  Laterality: N/A;   CORONARY ARTERY BYPASS GRAFT     a. [place]   ENDOSCOPIC RETROGRADE CHOLANGIOPANCREATOGRAPHY (ERCP) WITH PROPOFOL N/A 10/19/2020   Procedure: ENDOSCOPIC RETROGRADE CHOLANGIOPANCREATOGRAPHY (ERCP) WITH PROPOFOL;  Surgeon: [name], MD;  Location: [facility] ENDOSCOPY;  Service: Endoscopy;  Laterality: N/A;   ERCP N/A 12/29/2020   Procedure: ENDOSCOPIC RETROGRADE CHOLANGIOPANCREATOGRAPHY (ERCP);  Surgeon: [name], MD;  Location: [facility] ENDOSCOPY;  Service: Endoscopy;  Laterality: N/A;   ESOPHAGOGASTRODUODENOSCOPY (EGD) WITH PROPOFOL N/A 07/18/2021   Procedure: ESOPHAGOGASTRODUODENOSCOPY (EGD) WITH PROPOFOL;  Surgeon: Lin Landsman, MD;  Location: [facility] ENDOSCOPY;  Service: Gastroenterology;  Laterality: N/A;   Left AKA     RIGHT/LEFT HEART CATH AND CORONARY ANGIOGRAPHY N/A 06/06/2020   Procedure: RIGHT/LEFT HEART CATH AND CORONARY ANGIOGRAPHY;  Surgeon: Fletcher Anon,  Mertie Clause, MD;  Location: [facility] CV LAB;  Service: Cardiovascular;  Laterality: N/A;    Current Medications: No outpatient medications have been marked as taking for the 10/03/21 encounter (Appointment) with Rise Mu, PA-C.    Allergies:   Patient has no known allergies.   Social History   Socioeconomic History   Marital status: Single    Spouse name: Not on file   Number of children: Not on file   Years of education: Not on file   Highest education level: Not on file  Occupational History   Not on file  Tobacco Use   Smoking status: Every Day    Packs/day: 1.00    Years: 62.00    Pack years: 62.00    Types: Cigarettes   Smokeless tobacco: Never  Vaping Use   Vaping Use: Never used   Substance and Sexual Activity   Alcohol use: Never   Drug use: Never   Sexual activity: Not on file  Other Topics Concern   Not on file  Social History Narrative   Retired from [employer].  From [place], [place]. Moved to [place] ~ 2011 (pt initially said that he moved here in Feb 2020, but then said 9 yrs ago).   Social Determinants of Health   Financial Resource Strain: Not on file  Food Insecurity: Not on file  Transportation Needs: Not on file  Physical Activity: Not on file  Stress: Not on file  Social Connections: Not on file     Family History:  The patient's family history includes CAD in his sister; Heart attack in his mother; Heart disease in his brother; Other in his father, sister, and sister.  ROS:   ROS   EKGs/Labs/Other Studies Reviewed:    Studies reviewed were summarized above. The additional studies were reviewed today:  2D echo 07/2021: 1. Left ventricular ejection fraction, by estimation, is 20 to 25%. The  left ventricle has severely decreased function. The left ventricle  demonstrates severe global hypokinesis, anterior wall motion best  preserved. The left ventricular internal cavity  size was moderately dilated.   2. Right ventricular systolic function is normal. The right ventricular  size is normal.   3. Left atrial size was moderately dilated.   4. The mitral valve is normal in structure. Mild mitral valve  regurgitation.   5. The aortic valve was not well visualized. __________  2D echo 09/2020: 1. Left ventricular ejection fraction, by estimation, is 50 to 55%. The  left ventricle has low normal function. The left ventricle has no regional  wall motion abnormalities. The left ventricular internal cavity size was  mildly dilated. Left ventricular  diastolic parameters were normal.   2. Right ventricular systolic function is normal. The right ventricular  size is mildly enlarged.   3. Left atrial size was mildly dilated.   4. Right atrial size  was mildly dilated.   5. The mitral valve was not well visualized. Trivial mitral valve  regurgitation.   6. The aortic valve was not well visualized. Aortic valve regurgitation  is trivial. Mild aortic valve stenosis. __________  [facility] 05/2020: Prox Cx to Mid Cx lesion is 100% stenosed. Mid LAD lesion is 100% stenosed. Prox RCA lesion is 100% stenosed. LIMA graft was visualized by non-selective angiography and is normal in caliber. The graft exhibits no disease. RIMA graft was visualized by non-selective angiography and is normal in caliber. The graft exhibits no disease.   1.  Severe underlying three-vessel coronary artery disease  with patent grafts including LIMA to LAD, SVG Y graft to second diagonal and OM 3 and RIMA to RCA. 2.  Left ventricular angiography was not performed.  EF was severely reduced by echo. 3.  Right heart catheterization showed normal filling pressures, minimal pulmonary hypertension and severely reduced cardiac output.   Recommendations: Continue medical therapy for chronic systolic heart failure. No need for any revascularization. Continue aggressive treatment of risk factors. Xarelto can be resumed tomorrow. __________  2D echo 04/2020: 1. Left ventricular ejection fraction, by estimation, is 25 to 30%. The  left ventricle has severely decreased function. The left ventricle  demonstrates global hypokinesis. There is mild left ventricular  hypertrophy. Left ventricular diastolic parameters   are consistent with Grade I diastolic dysfunction (impaired relaxation).   2. Right ventricular systolic function is normal. The right ventricular  size is normal.   3. The mitral valve is degenerative. No evidence of mitral valve  regurgitation.   4. The aortic valve has been repaired/replaced. Aortic valve  regurgitation is not visualized. There is a a bioprosthetic valve present  in the aortic position. Echo findings are consistent with normal structure  and  function of the aortic valve  prosthesis.   5. Aortic dilatation noted. There is borderline dilatation of the aortic  root measuring 38 mm.   Comparison(s): 01/26/19 TTE reported a 40% LVEF. __________  [facility] MPI 01/2019: Abnormal, potentially high risk pharmacologic myocardial perfusion stress test. There is a large in size, severe, fixed inferior and inferolateral defect extending to the apex consistent with scar. No significant ischemia is identified, though sensititivity is reduced by the extent of prior infarct. The left ventricular ejection fraction is moderately to severely reduced (LVEF 20% by QGS and 37% by Siemens calculation). LVEF noted to be 40-45% on yesterday's echocardiogram. Attenuation correction CT is notable for post-CABG and AVR findings. The gallbladder is surgically absent. __________  2D echo 01/2019: 1. The left ventricle has mild-moderately reduced systolic function, with  an ejection fraction of 40-45%. The cavity size was mildly dilated. Left  ventricular diastolic Doppler parameters are consistent with impaired  relaxation. The study is not  adequate for wall motion abnormalities.   2. The right ventricle has normal systolic function. The cavity was  normal. There is no increase in right ventricular wall thickness.   3. Left atrial size was mildly dilated.   4. The mitral valve is degenerative. Moderate calcification of the mitral  valve leaflet. There is severe mitral annular calcification present.   5. The tricuspid valve is grossly normal.   6. The aortic valve is tricuspid. Moderate calcification of the aortic  valve. Aortic valve regurgitation was not assessed by color flow Doppler  mild-moderate stenosis of the aortic valve. Mean gradient of 13 mm Hg.   7. No pulmonic valve vegetation visualized.    EKG:  EKG is ordered today.  The EKG ordered today demonstrates ***  Recent Labs: 07/20/2021: Magnesium 1.8 08/03/2021: ALT 10; B Natriuretic  Peptide 1,688.3 08/04/2021: TSH 0.428 08/05/2021: BUN 21; Creatinine, Ser 1.45; Hemoglobin 11.1; Platelets 278; Potassium 3.4; Sodium 140  Recent Lipid Panel    Component Value Date/Time   CHOL 143 01/27/2019 0237   TRIG 152 (H) 01/27/2019 0237   HDL 51 01/27/2019 0237   CHOLHDL 2.8 01/27/2019 0237   VLDL 30 01/27/2019 0237   LDLCALC 62 01/27/2019 0237    PHYSICAL EXAM:    VS:  There were no vitals taken for this visit.  BMI: There  is no height or weight on file to calculate BMI.  Physical Exam  Wt Readings from Last 3 Encounters:  08/05/21 128 lb 12 oz (58.4 kg)  07/15/21 147 lb 11.3 oz (67 kg)  02/16/21 134 lb (60.8 kg)     ASSESSMENT & PLAN:   CAD status post CABG without***angina:  HFrEF secondary to ICM:  PAF/flutter:  Aortic stenosis status post bioprosthetic AVR:  HTN: Blood pressure ***  HLD: LDL 62 in 01/2019.  CKD stage III:  History of GI bleed with acute blood loss anemia:  PAD status post left AKA:   {Are you ordering a CV Procedure (e.g. stress test, cath, DCCV, TEE, etc)?   Press F2        :[id number]}     Disposition: F/u with Dr. Fletcher Anon or an APP in ***.   Medication Adjustments/Labs and Tests Ordered: Current medicines are reviewed at length with the patient today.  Concerns regarding medicines are outlined above. Medication changes, Labs and Tests ordered today are summarized above and listed in the Patient Instructions accessible in Encounters.   Signed, Christell Faith, PA-C 10/02/2021 9:52 AM     [facility] [address] [place] [address] [place], [place] [address] [phone]

## 2021-10-03 ENCOUNTER — Ambulatory Visit: Payer: Medicare Other | Admitting: Physician Assistant

## 2021-11-08 ENCOUNTER — Other Ambulatory Visit: Payer: Self-pay

## 2021-11-08 ENCOUNTER — Emergency Department: Payer: Medicare Other

## 2021-11-08 ENCOUNTER — Inpatient Hospital Stay
Admission: EM | Admit: 2021-11-08 | Discharge: 2021-11-10 | DRG: 291 | Disposition: A | Discharge status: Home Health | Payer: Medicare Other | Source: Skilled Nursing Facility | Attending: Internal Medicine | Admitting: Internal Medicine

## 2021-11-08 DIAGNOSIS — I480 Paroxysmal atrial fibrillation: Secondary | ICD-10-CM | POA: Diagnosis present

## 2021-11-08 DIAGNOSIS — E0590 Thyrotoxicosis, unspecified without thyrotoxic crisis or storm: Secondary | ICD-10-CM | POA: Diagnosis present

## 2021-11-08 DIAGNOSIS — J449 Chronic obstructive pulmonary disease, unspecified: Secondary | ICD-10-CM | POA: Diagnosis present

## 2021-11-08 DIAGNOSIS — Z953 Presence of xenogenic heart valve: Secondary | ICD-10-CM | POA: Diagnosis not present

## 2021-11-08 DIAGNOSIS — N1831 Chronic kidney disease, stage 3a: Secondary | ICD-10-CM | POA: Diagnosis present

## 2021-11-08 DIAGNOSIS — I5023 Acute on chronic systolic (congestive) heart failure: Secondary | ICD-10-CM | POA: Diagnosis not present

## 2021-11-08 DIAGNOSIS — E876 Hypokalemia: Secondary | ICD-10-CM | POA: Diagnosis present

## 2021-11-08 DIAGNOSIS — Z8616 Personal history of COVID-19: Secondary | ICD-10-CM | POA: Diagnosis not present

## 2021-11-08 DIAGNOSIS — F319 Bipolar disorder, unspecified: Secondary | ICD-10-CM | POA: Diagnosis present

## 2021-11-08 DIAGNOSIS — E1122 Type 2 diabetes mellitus with diabetic chronic kidney disease: Secondary | ICD-10-CM | POA: Diagnosis present

## 2021-11-08 DIAGNOSIS — Z7982 Long term (current) use of aspirin: Secondary | ICD-10-CM

## 2021-11-08 DIAGNOSIS — Z79899 Other long term (current) drug therapy: Secondary | ICD-10-CM

## 2021-11-08 DIAGNOSIS — I130 Hypertensive heart and chronic kidney disease with heart failure and stage 1 through stage 4 chronic kidney disease, or unspecified chronic kidney disease: Secondary | ICD-10-CM | POA: Diagnosis present

## 2021-11-08 DIAGNOSIS — Z7901 Long term (current) use of anticoagulants: Secondary | ICD-10-CM

## 2021-11-08 DIAGNOSIS — Z20822 Contact with and (suspected) exposure to covid-19: Secondary | ICD-10-CM | POA: Diagnosis present

## 2021-11-08 DIAGNOSIS — D509 Iron deficiency anemia, unspecified: Secondary | ICD-10-CM | POA: Diagnosis present

## 2021-11-08 DIAGNOSIS — I214 Non-ST elevation (NSTEMI) myocardial infarction: Secondary | ICD-10-CM | POA: Diagnosis not present

## 2021-11-08 DIAGNOSIS — K219 Gastro-esophageal reflux disease without esophagitis: Secondary | ICD-10-CM | POA: Diagnosis present

## 2021-11-08 DIAGNOSIS — I200 Unstable angina: Secondary | ICD-10-CM

## 2021-11-08 DIAGNOSIS — F17210 Nicotine dependence, cigarettes, uncomplicated: Secondary | ICD-10-CM | POA: Diagnosis present

## 2021-11-08 DIAGNOSIS — I209 Angina pectoris, unspecified: Secondary | ICD-10-CM

## 2021-11-08 DIAGNOSIS — E785 Hyperlipidemia, unspecified: Secondary | ICD-10-CM | POA: Diagnosis present

## 2021-11-08 DIAGNOSIS — I420 Dilated cardiomyopathy: Secondary | ICD-10-CM | POA: Diagnosis not present

## 2021-11-08 DIAGNOSIS — Z89612 Acquired absence of left leg above knee: Secondary | ICD-10-CM

## 2021-11-08 DIAGNOSIS — I25118 Atherosclerotic heart disease of native coronary artery with other forms of angina pectoris: Secondary | ICD-10-CM | POA: Diagnosis present

## 2021-11-08 DIAGNOSIS — E1151 Type 2 diabetes mellitus with diabetic peripheral angiopathy without gangrene: Secondary | ICD-10-CM | POA: Diagnosis present

## 2021-11-08 DIAGNOSIS — I255 Ischemic cardiomyopathy: Secondary | ICD-10-CM | POA: Diagnosis present

## 2021-11-08 DIAGNOSIS — I739 Peripheral vascular disease, unspecified: Secondary | ICD-10-CM | POA: Diagnosis not present

## 2021-11-08 DIAGNOSIS — Z951 Presence of aortocoronary bypass graft: Secondary | ICD-10-CM | POA: Diagnosis not present

## 2021-11-08 DIAGNOSIS — D649 Anemia, unspecified: Secondary | ICD-10-CM

## 2021-11-08 DIAGNOSIS — Z79890 Hormone replacement therapy (postmenopausal): Secondary | ICD-10-CM

## 2021-11-08 DIAGNOSIS — I5043 Acute on chronic combined systolic (congestive) and diastolic (congestive) heart failure: Secondary | ICD-10-CM | POA: Diagnosis present

## 2021-11-08 DIAGNOSIS — I509 Heart failure, unspecified: Secondary | ICD-10-CM

## 2021-11-08 DIAGNOSIS — D508 Other iron deficiency anemias: Secondary | ICD-10-CM | POA: Diagnosis not present

## 2021-11-08 DIAGNOSIS — Z8249 Family history of ischemic heart disease and other diseases of the circulatory system: Secondary | ICD-10-CM

## 2021-11-08 LAB — CBC
HCT: 32.6 % — ABNORMAL LOW (ref 39.0–52.0)
Hemoglobin: 9.8 g/dL — ABNORMAL LOW (ref 13.0–17.0)
MCH: 22.4 pg — ABNORMAL LOW (ref 26.0–34.0)
MCHC: 30.1 g/dL (ref 30.0–36.0)
MCV: 74.6 fL — ABNORMAL LOW (ref 80.0–100.0)
Platelets: 220 10*3/uL (ref 150–400)
RBC: 4.37 MIL/uL (ref 4.22–5.81)
RDW: 18.8 % — ABNORMAL HIGH (ref 11.5–15.5)
WBC: 8.3 10*3/uL (ref 4.0–10.5)
nRBC: 0 % (ref 0.0–0.2)

## 2021-11-08 LAB — BASIC METABOLIC PANEL
Anion gap: 6 (ref 5–15)
BUN: 17 mg/dL (ref 8–23)
CO2: 22 mmol/L (ref 22–32)
Calcium: 8.6 mg/dL — ABNORMAL LOW (ref 8.9–10.3)
Chloride: 111 mmol/L (ref 98–111)
Creatinine, Ser: 1.29 mg/dL — ABNORMAL HIGH (ref 0.61–1.24)
GFR, Estimated: 57 mL/min — ABNORMAL LOW (ref >60)
Glucose, Bld: 148 mg/dL — ABNORMAL HIGH (ref 70–99)
Potassium: 3.8 mmol/L (ref 3.5–5.1)
Sodium: 139 mmol/L (ref 135–145)

## 2021-11-08 LAB — BRAIN NATRIURETIC PEPTIDE: B Natriuretic Peptide: 2623.7 pg/mL — ABNORMAL HIGH (ref 0.0–100.0)

## 2021-11-08 LAB — APTT: aPTT: 34 seconds (ref 24–36)

## 2021-11-08 LAB — PROTIME-INR
INR: 1.2 (ref 0.8–1.2)
Prothrombin Time: 15.6 seconds — ABNORMAL HIGH (ref 11.4–15.2)

## 2021-11-08 LAB — RESP PANEL BY RT-PCR (FLU A&B, COVID) ARPGX2
Influenza A by PCR: NEGATIVE
Influenza B by PCR: NEGATIVE
SARS Coronavirus 2 by RT PCR: NEGATIVE

## 2021-11-08 LAB — CBG MONITORING, ED: Glucose-Capillary: 140 mg/dL — ABNORMAL HIGH (ref 70–99)

## 2021-11-08 LAB — TROPONIN I (HIGH SENSITIVITY): Troponin I (High Sensitivity): 254 ng/L (ref <18)

## 2021-11-08 LAB — PROCALCITONIN: Procalcitonin: <0.1 ng/mL

## 2021-11-08 MED ORDER — FUROSEMIDE 10 MG/ML IJ SOLN
40.0000 mg | Freq: Once | INTRAMUSCULAR | Status: AC
  Administered 2021-11-08: 40 mg via INTRAVENOUS
  Filled 2021-11-08: qty 4

## 2021-11-08 MED ORDER — INSULIN ASPART 100 UNIT/ML IJ SOLN
0.0000 [IU] | Freq: Three times a day (TID) | INTRAMUSCULAR | Status: DC
  Administered 2021-11-09: 3 [IU] via SUBCUTANEOUS
  Administered 2021-11-09 – 2021-11-10 (×3): 1 [IU] via SUBCUTANEOUS
  Administered 2021-11-10: 2 [IU] via SUBCUTANEOUS
  Filled 2021-11-08 (×5): qty 1

## 2021-11-08 MED ORDER — PANTOPRAZOLE SODIUM 40 MG PO TBEC
40.0000 mg | DELAYED_RELEASE_TABLET | Freq: Every day | ORAL | Status: DC
  Administered 2021-11-09 – 2021-11-10 (×2): 40 mg via ORAL
  Filled 2021-11-08 (×2): qty 1

## 2021-11-08 MED ORDER — ONDANSETRON HCL 4 MG/2ML IJ SOLN: 4.0000 mg | Freq: Four times a day (QID) | INTRAMUSCULAR | Status: DC | PRN

## 2021-11-08 MED ORDER — SACUBITRIL-VALSARTAN 24-26 MG PO TABS
1.0000 | ORAL_TABLET | Freq: Two times a day (BID) | ORAL | Status: DC
  Administered 2021-11-09 – 2021-11-10 (×3): 1 via ORAL
  Filled 2021-11-08 (×5): qty 1

## 2021-11-08 MED ORDER — MONTELUKAST SODIUM 10 MG PO TABS
10.0000 mg | ORAL_TABLET | Freq: Every day | ORAL | Status: DC
Start: 2021-11-08 — End: 2021-11-10
  Administered 2021-11-08 – 2021-11-09 (×2): 10 mg via ORAL
  Filled 2021-11-08 (×2): qty 1

## 2021-11-08 MED ORDER — NITROGLYCERIN 2 % TD OINT
0.5000 [in_us] | TOPICAL_OINTMENT | Freq: Once | TRANSDERMAL | Status: AC
  Administered 2021-11-08: 0.5 [in_us] via TOPICAL
  Filled 2021-11-08: qty 1

## 2021-11-08 MED ORDER — CARVEDILOL 3.125 MG PO TABS
3.1250 mg | ORAL_TABLET | Freq: Two times a day (BID) | ORAL | Status: DC
Start: 2021-11-08 — End: 2021-11-10
  Administered 2021-11-08 – 2021-11-10 (×4): 3.125 mg via ORAL
  Filled 2021-11-08 (×4): qty 1

## 2021-11-08 MED ORDER — ALBUTEROL SULFATE (2.5 MG/3ML) 0.083% IN NEBU
3.0000 mL | INHALATION_SOLUTION | Freq: Four times a day (QID) | RESPIRATORY_TRACT | Status: DC | PRN
  Administered 2021-11-09: 3 mL via RESPIRATORY_TRACT
  Filled 2021-11-08: qty 3

## 2021-11-08 MED ORDER — ATORVASTATIN CALCIUM 80 MG PO TABS
80.0000 mg | ORAL_TABLET | Freq: Every day | ORAL | Status: DC
Start: 2021-11-08 — End: 2021-11-10
  Administered 2021-11-08 – 2021-11-09 (×2): 80 mg via ORAL
  Filled 2021-11-08: qty 4
  Filled 2021-11-08: qty 1

## 2021-11-08 MED ORDER — DULOXETINE HCL 30 MG PO CPEP
30.0000 mg | ORAL_CAPSULE | Freq: Two times a day (BID) | ORAL | Status: DC
  Administered 2021-11-09 – 2021-11-10 (×3): 30 mg via ORAL
  Filled 2021-11-08 (×5): qty 1

## 2021-11-08 MED ORDER — VITAMIN D 25 MCG (1000 UNIT) PO TABS
1000.0000 [IU] | ORAL_TABLET | Freq: Every evening | ORAL | Status: DC
  Administered 2021-11-08 – 2021-11-09 (×2): 1000 [IU] via ORAL
  Filled 2021-11-08 (×2): qty 1

## 2021-11-08 MED ORDER — ISOSORBIDE MONONITRATE ER 30 MG PO TB24
30.0000 mg | ORAL_TABLET | Freq: Every day | ORAL | Status: DC
  Administered 2021-11-09 – 2021-11-10 (×2): 30 mg via ORAL
  Filled 2021-11-08 (×2): qty 1

## 2021-11-08 MED ORDER — NITROGLYCERIN IN D5W 200-5 MCG/ML-% IV SOLN
0.0000 ug/min | INTRAVENOUS | Status: DC
  Administered 2021-11-08: 20 ug/min via INTRAVENOUS
  Administered 2021-11-08: 15 ug/min via INTRAVENOUS
  Administered 2021-11-08: 10 ug/min via INTRAVENOUS
  Administered 2021-11-08: 5 ug/min via INTRAVENOUS
  Administered 2021-11-09: 40 ug/min via INTRAVENOUS
  Administered 2021-11-09: 35 ug/min via INTRAVENOUS
  Administered 2021-11-09: 45 ug/min via INTRAVENOUS
  Administered 2021-11-09: 30 ug/min via INTRAVENOUS
  Administered 2021-11-09: 25 ug/min via INTRAVENOUS
  Filled 2021-11-08: qty 250

## 2021-11-08 MED ORDER — LAMOTRIGINE 25 MG PO TABS
25.0000 mg | ORAL_TABLET | Freq: Two times a day (BID) | ORAL | Status: DC
Start: 2021-11-08 — End: 2021-11-10
  Administered 2021-11-08 – 2021-11-10 (×4): 25 mg via ORAL
  Filled 2021-11-08 (×5): qty 1

## 2021-11-08 MED ORDER — LORATADINE 10 MG PO TABS
10.0000 mg | ORAL_TABLET | Freq: Every day | ORAL | Status: DC
  Administered 2021-11-09 – 2021-11-10 (×2): 10 mg via ORAL
  Filled 2021-11-08 (×2): qty 1

## 2021-11-08 MED ORDER — ACETAMINOPHEN 325 MG PO TABS
650.0000 mg | ORAL_TABLET | ORAL | Status: DC | PRN
  Administered 2021-11-08 – 2021-11-10 (×3): 650 mg via ORAL
  Filled 2021-11-08 (×3): qty 2

## 2021-11-08 MED ORDER — HEPARIN BOLUS VIA INFUSION
4000.0000 [IU] | Freq: Once | INTRAVENOUS | Status: AC
  Administered 2021-11-08: 4000 [IU] via INTRAVENOUS
  Filled 2021-11-08: qty 4000

## 2021-11-08 MED ORDER — ASPIRIN 81 MG PO CHEW
324.0000 mg | CHEWABLE_TABLET | Freq: Once | ORAL | Status: AC
  Administered 2021-11-08: 324 mg via ORAL
  Filled 2021-11-08: qty 4

## 2021-11-08 MED ORDER — NITROGLYCERIN 0.4 MG SL SUBL
0.4000 mg | SUBLINGUAL_TABLET | SUBLINGUAL | Status: DC | PRN
  Administered 2021-11-09 (×3): 0.4 mg via SUBLINGUAL
  Filled 2021-11-08 (×3): qty 1

## 2021-11-08 MED ORDER — HEPARIN (PORCINE) 25000 UT/250ML-% IV SOLN
INTRAVENOUS | Status: AC
  Administered 2021-11-08: 800 [IU]/h via INTRAVENOUS
  Filled 2021-11-08: qty 250

## 2021-11-08 MED ORDER — HEPARIN (PORCINE) 25000 UT/250ML-% IV SOLN
1050.0000 [IU]/h | INTRAVENOUS | Status: DC
  Filled 2021-11-08: qty 250

## 2021-11-08 MED ORDER — MELATONIN 5 MG PO TABS
5.0000 mg | ORAL_TABLET | Freq: Every day | ORAL | Status: DC
  Administered 2021-11-08 – 2021-11-09 (×2): 5 mg via ORAL
  Filled 2021-11-08 (×2): qty 1

## 2021-11-08 MED ORDER — FUROSEMIDE 10 MG/ML IJ SOLN
20.0000 mg | Freq: Two times a day (BID) | INTRAMUSCULAR | Status: DC
  Administered 2021-11-09 (×2): 20 mg via INTRAVENOUS
  Filled 2021-11-08 (×2): qty 2

## 2021-11-08 MED ORDER — ASPIRIN EC 81 MG PO TBEC
81.0000 mg | DELAYED_RELEASE_TABLET | Freq: Every day | ORAL | Status: DC
  Administered 2021-11-09 – 2021-11-10 (×2): 81 mg via ORAL
  Filled 2021-11-08 (×2): qty 1

## 2021-11-08 NOTE — ED Notes (Signed)
Pain 7/10  ntg drip increased.  Pt alert

## 2021-11-08 NOTE — ED Provider Notes (Signed)
[facility] Provider Note    Event Date/Time   First MD Initiated Contact with Patient 11/08/21 1513     (approximate)   History   Chest Pain   HPI  Shawn Jennings is a 76 y.o. male history of CAD, CHF, COPD presents to the ER for evaluation of chest pain as well as shortness of breath.  Symptoms have been going on for about 24 hours.  Has been compliant with his medications.  Is on Eliquis.  Does still smoke.  States he has had progressive adductive cough.  No inspiratory pain.  Has history of CABG and had coronary angiography in August 2021 with severe underlying three-vessel coronary disease with patent grafts.     Physical Exam   Triage Vital Signs: ED Triage Vitals  Enc Vitals Group     BP 11/08/21 1408 (!) 151/92     Pulse Rate 11/08/21 1408 72     Resp 11/08/21 1408 (!) 22     Temp 11/08/21 1408 98 F (36.7 C)     Temp src --      SpO2 11/08/21 1408 94 %     Weight --      Height --      Head Circumference --      Peak Flow --      Pain Score 11/08/21 1403 6     Pain Loc --      Pain Edu? --      Excl. in [place]? --     Most recent vital signs: Vitals:   11/08/21 1408  BP: (!) 151/92  Pulse: 72  Resp: (!) 22  Temp: 98 F (36.7 C)  SpO2: 94%     Constitutional: Alert  Eyes: Conjunctivae are normal.  Head: Atraumatic. Nose: No congestion/rhinnorhea. Mouth/Throat: Mucous membranes are moist.   Neck: Painless ROM.  Cardiovascular:   Good peripheral circulation. Well perfused, grossly normal heart sounds Respiratory:  bilateral posterior inspiratory crackles Gastrointestinal: Soft and nontender.  Musculoskeletal:  no deformity, sp left aka Neurologic:  MAE spontaneously. No gross focal neurologic deficits are appreciated.  Skin:  Skin is warm, dry and intact. No rash noted. Psychiatric: Mood and affect are normal. Speech and behavior are normal.    ED Results / Procedures / Treatments   Labs (all labs ordered are listed,  but only abnormal results are displayed) Labs Reviewed  BASIC METABOLIC PANEL - Abnormal; Notable for the following components:      Result Value   Glucose, Bld 148 (*)    Creatinine, Ser 1.29 (*)    Calcium 8.6 (*)    GFR, Estimated 57 (*)    All other components within normal limits  CBC - Abnormal; Notable for the following components:   Hemoglobin 9.8 (*)    HCT 32.6 (*)    MCV 74.6 (*)    MCH 22.4 (*)    RDW 18.8 (*)    All other components within normal limits  PROTIME-INR - Abnormal; Notable for the following components:   Prothrombin Time 15.6 (*)    All other components within normal limits  TROPONIN I (HIGH SENSITIVITY) - Abnormal; Notable for the following components:   Troponin I (High Sensitivity) 254 (*)    All other components within normal limits  CULTURE, BLOOD (ROUTINE X 2)  CULTURE, BLOOD (ROUTINE X 2)  RESP PANEL BY RT-PCR (FLU A&B, COVID) ARPGX2  APTT  BRAIN NATRIURETIC PEPTIDE  PROCALCITONIN  TROPONIN I (HIGH SENSITIVITY)  EKG  ED ECG REPORT I, Merlyn Lot, the attending physician, personally viewed and interpreted this ECG.   Date: 11/08/2021  EKG Time: 14:03  Rate: 70  Rhythm: sinus  Axis: normal  Intervals:normal intervals,  ST&T Change: inferolateral st depressions, no stemi criteria.  Changed from previous    RADIOLOGY Please see ED Course for my review and interpretation.  I personally reviewed all radiographic images ordered to evaluate for the above acute complaints and reviewed radiology reports and findings.  These findings were personally discussed with the patient.  Please see medical record for radiology report.    PROCEDURES:  Critical Care performed: Yes, see critical care procedure note(s)  .Critical Care Performed by: Merlyn Lot, MD Authorized by: Merlyn Lot, MD   Critical care provider statement:    Critical care time (minutes):  35   Critical care was necessary to treat or prevent imminent  or life-threatening deterioration of the following conditions:  Cardiac failure   Critical care was time spent personally by me on the following activities:  Ordering and performing treatments and interventions, ordering and review of laboratory studies, ordering and review of radiographic studies, pulse oximetry, re-evaluation of patient's condition, review of old charts, obtaining history from patient or surrogate, examination of patient, evaluation of patient's response to treatment, discussions with primary provider, discussions with consultants and development of treatment plan with patient or surrogate   MEDICATIONS ORDERED IN ED: Medications  aspirin chewable tablet 324 mg (has no administration in time range)  furosemide (LASIX) injection 40 mg (has no administration in time range)  nitroGLYCERIN (NITROGLYN) 2 % ointment 0.5 inch (has no administration in time range)  heparin 25000 UT/250ML infusion (has no administration in time range)  heparin bolus via infusion 4,000 Units (has no administration in time range)  heparin ADULT infusion 100 units/mL (25000 units/235mL) (has no administration in time range)     IMPRESSION / MDM / ASSESSMENT AND PLAN / ED COURSE  I reviewed the triage vital signs and the nursing notes.                              Differential diagnosis includes, but is not limited to, ACS, pericarditis, esophagitis, boerhaaves, pe, dissection, pna, bronchitis, costochondritis   Patient presenting with chest pain shortness of breath as described above.  He is protecting his airway mildly hypertensive no hypoxia no fever.  His abdominal exam is soft and benign.  EKG does show some inferolateral ST changes no STEMI criteria.  His initial troponin is significantly elevated.  No white count no significant anemia and normal platelets.  Has some mild renal dysfunction but electrolytes otherwise normal.  Patient will be given aspirin, IV heparin and nitro given presentation  concerning for acute unstable angina.  Does not seem consistent with PE or dissection.  Have a lower suspicion for pneumonia given presentation.  Will give dose of IV lasix given concern for acute on chronic CHF.  Will consult cardiology.  Patient will require hospitalization  Clinical Course as of 11/08/21 1606  Wed Nov 08, 2021  1604 Case discussed in consultation with hospitalist who agrees to admit patient to their service.  The patient will be placed on continuous pulse oximetry and telemetry for monitoring.  Laboratory evaluation will be sent to evaluate for the above complaints.    [PR]    Clinical Course User Index [PR] Merlyn Lot, MD     FINAL CLINICAL IMPRESSION(S) /  ED DIAGNOSES   Final diagnoses:  Unstable angina (HCC)  Acute on chronic congestive heart failure, unspecified heart failure type ([place])     Rx / DC Orders   ED Discharge Orders     None        Note:  This document was prepared using Dragon voice recognition software and may include unintentional dictation errors.    Merlyn Lot, MD 11/08/21 1606

## 2021-11-08 NOTE — H&P (Addendum)
History and Physical:    Shawn Jennings   ZSW:[id number] DOB: [birth year] DOA: 11/08/2021  Referring MD/provider: Merlyn Lot, MD PCP: [facility]   Patient coming from: Assisted living facility  Chief Complaint: Chest pain  History of Present Illness:   Shawn Jennings is a 76 y.o. male with medical history significant for type II DM, hypertension, CAD s/p CABG, chronic systolic CHF (ischemic cardiomyopathy EF 20 to 25%), COPD, CKD stage III, aortic stenosis, paroxysmal atrial fibrillation on Eliquis, history of GI bleed, history of COVID-19 infection, PVD, thyrotoxicosis, bipolar disorder, tobacco use disorder.  He presented to the hospital because of sudden onset of severe chest pain that started around 9:30 AM today.  He said chest pain or occurred while he was at rest.  He describes the pain as substernal and crushing in nature as to heavy object was standing on his chest.  It was nonradiating but respiratory rate shortness of breath.  He only got little relief when he was given aspirin and nitroglycerin in the emergency room.  ED Course: Troponin was 254, BNP was 3623.7, EKG showed normal sinus rhythm with T wave inversions in the inferior and lateral leads.  The patient was given aspirin, IV Lasix and topical nitroglycerin in the ED.  He was also started on IV heparin in the emergency room.    ROS:   ROS all other systems reviewed were negative  Past Medical History:   Past Medical History:  Diagnosis Date   Aortic stenosis    a. Pt unaware of history but CT imaging 01/2019 consistent w/ AVR; b. 01/2019 Echo: Mild to mod AS. Mean grad 37mmHg.   CAD (coronary artery disease)    a. 1994 s/p CABG ([place]); b. Reports multiple stress tests over the years w/o repeat cath; c. 01/2019 MV: large, sev, fixed inf and inflat defect extending to apex. No ischemia. EF 37%-->Med rx given atypical Ss and pt wishes.   CHF (congestive heart failure) (HCC)    CKD (chronic  kidney disease), stage III (HCC)    COPD (chronic obstructive pulmonary disease) (HCC)    Depression    Essential hypertension    GERD (gastroesophageal reflux disease)    Hyperlipidemia LDL goal <70    Ischemic cardiomyopathy    a. 01/2019 Echo: EF 40-45%, impaired relaxation. Mildly dil LA. Sev mitral annular Ca2+. Mild to mod AS.   PAD (peripheral artery disease) ([place])    a. 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   PAF (paroxysmal atrial fibrillation) (HCC)    a. CHA2DS2VASc = 4-->Xarelto 15mg  daily in setting of CKD.   Thyrotoxicosis    Tobacco abuse    Type II diabetes mellitus ([place])     Past Surgical History:   Past Surgical History:  Procedure Laterality Date   CARDIAC CATHETERIZATION     CHOLECYSTECTOMY     COLONOSCOPY     COLONOSCOPY WITH PROPOFOL N/A 07/20/2021   Procedure: COLONOSCOPY WITH PROPOFOL;  Surgeon: Lin Landsman, MD;  Location: [facility] ENDOSCOPY;  Service: Gastroenterology;  Laterality: N/A;   CORONARY ARTERY BYPASS GRAFT     a. [place]   ENDOSCOPIC RETROGRADE CHOLANGIOPANCREATOGRAPHY (ERCP) WITH PROPOFOL N/A 10/19/2020   Procedure: ENDOSCOPIC RETROGRADE CHOLANGIOPANCREATOGRAPHY (ERCP) WITH PROPOFOL;  Surgeon: [name], MD;  Location: [facility] ENDOSCOPY;  Service: Endoscopy;  Laterality: N/A;   ERCP N/A 12/29/2020   Procedure: ENDOSCOPIC RETROGRADE CHOLANGIOPANCREATOGRAPHY (ERCP);  Surgeon: [name], MD;  Location[facility] ENDOSCOPY;  Service: Endoscopy;  Laterality: N/A;   ESOPHAGOGASTRODUODENOSCOPY (  EGD) WITH PROPOFOL N/A 07/18/2021   Procedure: ESOPHAGOGASTRODUODENOSCOPY (EGD) WITH PROPOFOL;  Surgeon: Lin Landsman, MD;  Location: [place];  Service: Gastroenterology;  Laterality: N/A;   Left AKA     RIGHT/LEFT HEART CATH AND CORONARY ANGIOGRAPHY N/A 06/06/2020   Procedure: RIGHT/LEFT HEART CATH AND CORONARY ANGIOGRAPHY;  Surgeon: Wellington Hampshire, MD;  Location: [place];  Service: Cardiovascular;  Laterality: N/A;    Social History:    Social History   Socioeconomic History   Marital status: Single    Spouse name: Not on file   Number of children: Not on file   Years of education: Not on file   Highest education level: Not on file  Occupational History   Not on file  Tobacco Use   Smoking status: Every Day    Packs/day: 1.00    Years: 62.00    Pack years: 62.00    Types: Cigarettes   Smokeless tobacco: Never  Vaping Use   Vaping Use: Never used  Substance and Sexual Activity   Alcohol use: Never   Drug use: Never   Sexual activity: Not on file  Other Topics Concern   Not on file  Social History Narrative   Retired from [employer].  From [place], [place]. Moved to [place] ~ 2011 (pt initially said that he moved here in Feb 2020, but then said 9 yrs ago).   Social Determinants of Health   Financial Resource Strain: Not on file  Food Insecurity: Not on file  Transportation Needs: Not on file  Physical Activity: Not on file  Stress: Not on file  Social Connections: Not on file  Intimate Partner Violence: Not on file    Allergies   Patient has no known allergies.  Family history:   Family History  Problem Relation Age of Onset   Heart attack Mother        died @ 81.   Other Father        never knew his father.   Other Sister        overdose of sleeping pills.   Heart disease Brother        died in his 63's   Other Sister        complication of abd surgery @ 2   CAD Sister     Current Medications:   Prior to Admission medications   Medication Sig Start Date End Date Taking? Authorizing Provider  apixaban (ELIQUIS) 5 MG TABS tablet Take 1 tablet (5 mg total) by mouth 2 (two) times daily. 07/22/21  Yes Wieting, Richard, MD  atorvastatin (LIPITOR) 80 MG tablet Take 80 mg by mouth at bedtime.   Yes [provider]  carvedilol (COREG) 3.125 MG tablet Take 1 tablet (3.125 mg total) by mouth 2 (two) times daily. 08/23/21 11/21/21 Yes Hackney, Otila Kluver A, FNP  cholecalciferol (VITAMIN D3) 25 MCG  (1000 UNIT) tablet Take 1,000 Units by mouth every evening.   Yes [provider]  dapagliflozin propanediol (FARXIGA) 5 MG TABS tablet Take 5 mg by mouth daily.   Yes [provider]  DULoxetine (CYMBALTA) 30 MG capsule Take 30 mg by mouth 2 (two) times daily.   Yes [provider]  ferrous sulfate 325 (65 FE) MG tablet Take 325 mg by mouth every other day.   Yes [provider]  isosorbide mononitrate (IMDUR) 30 MG 24 hr tablet Take 1 tablet (30 mg total) by mouth daily. 03/11/20 05/27/29 Yes Sharen Hones, MD  lamoTRIgine (LAMICTAL)  25 MG tablet Take 25 mg by mouth 2 (two) times daily.   Yes [provider]  loratadine (CLARITIN) 10 MG tablet Take 10 mg by mouth daily.   Yes [provider]  Melatonin 3 MG TABS Take 1 tablet by mouth at bedtime.   Yes [provider]  montelukast (SINGULAIR) 10 MG tablet Take 10 mg by mouth at bedtime.   Yes [provider]  Multiple Vitamins-Minerals (PRESERVISION AREDS) CAPS Take 1 capsule by mouth in the morning and at bedtime.   Yes [provider]  nitroGLYCERIN (NITROSTAT) 0.4 MG SL tablet Place 0.4 mg under the tongue every 5 (five) minutes as needed for chest pain.   Yes [provider]  pantoprazole (PROTONIX) 40 MG tablet Take 40 mg by mouth daily.   Yes [provider]  sacubitril-valsartan (ENTRESTO) 24-26 MG Take 1 tablet by mouth 2 (two) times daily.   Yes [provider]  vitamin B-12 (CYANOCOBALAMIN) 1000 MCG tablet Take 1,000 mcg by mouth daily.   Yes [provider]  acetaminophen (TYLENOL) 500 MG tablet Take 500 mg by mouth every 8 (eight) hours as needed. Patient not taking: Reported on 11/08/2021    [provider]  albuterol (VENTOLIN HFA) 108 (90 Base) MCG/ACT inhaler Inhale 2 puffs into the lungs every 6 (six) hours as needed for wheezing or shortness of breath.     [provider]  aspirin EC 81 MG tablet Take  81 mg by mouth daily. Swallow whole. Patient not taking: Reported on 08/23/2021    [provider]  furosemide (LASIX) 40 MG tablet Take 40 mg by mouth daily. Patient not taking: Reported on 08/23/2021    [provider]  hydrOXYzine (ATARAX/VISTARIL) 25 MG tablet Take 25 mg by mouth every 6 (six) hours as needed (allergy symptoms).    [provider]  potassium chloride (KLOR-CON) 20 MEQ packet Take 20 mEq by mouth daily. Patient not taking: Reported on 08/23/2021    [provider]  senna (SENOKOT) 8.6 MG tablet Take 1 tablet by mouth daily. Patient not taking: Reported on 08/23/2021    [provider]  tamsulosin (FLOMAX) 0.4 MG CAPS capsule Take 0.4 mg by mouth daily. Patient not taking: Reported on 08/23/2021    [provider]    Physical Exam:   Vitals:   11/08/21 1408 11/08/21 1505 11/08/21 1530 11/08/21 1600  BP: (!) 151/92  (!) 146/80 (!) 141/82  Pulse: 72  81 75  Resp: (!) 22  (!) 28 (!) 23  Temp: 98 F (36.7 C)     SpO2: 94%     Weight:  65 kg       Physical Exam: Blood pressure (!) 141/82, pulse 75, temperature 98 F (36.7 C), resp. rate (!) 23, weight 65 kg, SpO2 94 %. Gen: No acute distress. Head: Normocephalic, atraumatic. Eyes: Pupils equal, round and reactive to light. Extraocular movements intact.  Sclerae nonicteric.  Mouth: Moist mucous membranes Neck: Supple, no thyromegaly, no lymphadenopathy, no jugular venous distention. Chest: Bibasilar rales.  Mild rhonchi bilaterally. CV: Heart sounds are regular with an S1, S2. No murmurs, rubs or gallops.  Abdomen: Soft, nontender, nondistended with normal active bowel sounds. No palpable masses. Extremities: Extremities are without clubbing, or cyanosis. No edema. Pedal pulses 2+.  Skin: Warm and dry. No rashes, lesions or wounds Neuro: Alert and oriented times 3; grossly nonfocal.  Psych: Insight is good and judgment is appropriate. Mood and affect  normal.  Data Review:    Labs: Basic Metabolic Panel: Recent Labs  Lab 11/08/21 1408  NA 139  K 3.8  CL 111  CO2 22  GLUCOSE 148*  BUN 17  CREATININE 1.29*  CALCIUM 8.6*   Liver Function Tests: No results for input(s): AST, ALT, ALKPHOS, BILITOT, PROT, ALBUMIN in the last 168 hours. No results for input(s): LIPASE, AMYLASE in the last 168 hours. No results for input(s): AMMONIA in the last 168 hours. CBC: Recent Labs  Lab 11/08/21 1408  WBC 8.3  HGB 9.8*  HCT 32.6*  MCV 74.6*  PLT 220   Cardiac Enzymes: No results for input(s): CKTOTAL, CKMB, CKMBINDEX, TROPONINI in the last 168 hours.  BNP (last 3 results) No results for input(s): PROBNP in the last 8760 hours. CBG: No results for input(s): GLUCAP in the last 168 hours.  Urinalysis    Component Value Date/Time   COLORURINE STRAW (A) 07/15/2021 1839   APPEARANCEUR CLEAR (A) 07/15/2021 1839   LABSPEC 1.014 07/15/2021 1839   PHURINE 5.0 07/15/2021 1839   GLUCOSEU 150 (A) 07/15/2021 1839   HGBUR NEGATIVE 07/15/2021 1839   BILIRUBINUR NEGATIVE 07/15/2021 1839   KETONESUR NEGATIVE 07/15/2021 1839   PROTEINUR NEGATIVE 07/15/2021 1839   NITRITE NEGATIVE 07/15/2021 1839   LEUKOCYTESUR NEGATIVE 07/15/2021 1839      Radiographic Studies: DG Chest 2 View  Result Date: 11/08/2021 CLINICAL DATA:  Chest pain EXAM: CHEST - 2 VIEW COMPARISON:  Chest radiograph 08/03/2021 FINDINGS: Median sternotomy wires, aortic valve prosthesis, and mediastinal surgical clips are again noted, with multiple fractured sternotomy wires. The heart is enlarged, unchanged. The upper mediastinal contours are stable. There are patchy opacities in the right lower lobe with small right larger than left pleural effusions. There is no pneumothorax. There is no acute osseous abnormality. A remote fracture of the left clavicle is again seen. IMPRESSION: 1. Patchy opacities in the right base and small right larger than left pleural effusions  suspicious for pneumonia in the correct clinical setting. Recommend follow-up radiographs in 6-8 weeks to assess for resolution. 2. Unchanged cardiomegaly. Electronically Signed   By: Valetta Mole M.D.   On: 11/08/2021 14:36    EKG: Independently reviewed by me.  Normal sinus rhythm, T wave inversions in inferior leads and leads V4 to V6   Assessment/Plan:   Principal Problem:   NSTEMI (non-ST elevated myocardial infarction) ([place]) Active Problems:   Acute on chronic systolic CHF (congestive heart failure) (HCC)     Body mass index is 19.43 kg/m.  Acute NSTEMI: Admit to progressive care unit.  Treat with IV heparin infusion and monitor heparin level per protocol.  Continue aspirin and Lipitor.  Dr. Quentin Cornwall has already consulted [facility] cardiology group.  Acute on chronic systolic CHF: Treat with IV Lasix.  Continue carvedilol, Entresto.  Monitor BMP, daily weight and urine output.  2D echo in October 2022 showed EF estimated at 20 to 25%  Paroxysmal atrial fibrillation: Hold Eliquis while on IV heparin.  Type II DM: Hold [place].  Use NovoLog as needed for hyperglycemia.  Other comorbidities include CKD stage IIIa, depression, hypertension, PAD, COPD, aortic stenosis, bipolar disorder, tobacco use disorder  Patient has been counseled to quit smoking cigarettes.     Other information:   DVT prophylaxis:   IV Heparin  Code Status: Full code. Family Communication: None Disposition Plan: Possible discharge to home in 2 to 3 days Consults called: Cardiologist Admission status: Inpatient  The medical decision making on this patient was  of high complexity and the patient is at high risk for clinical deterioration, therefore this is a level 3 visit.     Mallissa Lorenzen [facility] Pager: Please check [email or web address]   How to contact the [facility] Attending or Consulting provider [place] or covering provider during after hours [place], for this patient?   Check the care team  in [facility] and look for a) attending/consulting [facility] provider listed and b) the [facility] team listed Log into [email or web address] and use [place]'s universal password to access. If you do not have the password, please contact the hospital operator. Locate the [facility] provider you are looking for under [facility] and page to a number that you can be directly reached. If you still have difficulty reaching the provider, please page the [facility] (Director on Call) for the Hospitalists listed on amion for assistance.  11/08/2021, 4:47 PM

## 2021-11-08 NOTE — ED Notes (Signed)
Pt sleeping. 

## 2021-11-08 NOTE — Consult Note (Signed)
ANTICOAGULATION CONSULT NOTE - Initial Consult  Pharmacy Consult for Heparin Indication: chest pain/ACS  No Known Allergies  Patient Measurements: Weight: 65 kg (143 lb 4.8 oz) Heparin Dosing Weight: 65 kg  Vital Signs: Temp: 98 F (36.7 C) (01/18 1408) BP: 158/82 (01/18 1700) Pulse Rate: 84 (01/18 1700)  Labs: Recent Labs    11/08/21 1408 11/08/21 1539  HGB 9.8*  --   HCT 32.6*  --   PLT 220  --   APTT  --  34  LABPROT  --  15.6*  INR  --  1.2  CREATININE 1.29*  --   TROPONINIHS 254*  --     Estimated Creatinine Clearance: 44.1 mL/min (A) (by C-G formula based on SCr of 1.29 mg/dL (H)).   Medical History: Past Medical History:  Diagnosis Date   Aortic stenosis    a. Pt unaware of history but CT imaging 01/2019 consistent w/ AVR; b. 01/2019 Echo: Mild to mod AS. Mean grad 60mmHg.   CAD (coronary artery disease)    a. 1994 s/p CABG ([place]); b. Reports multiple stress tests over the years w/o repeat cath; c. 01/2019 MV: large, sev, fixed inf and inflat defect extending to apex. No ischemia. EF 37%-->Med rx given atypical Ss and pt wishes.   CHF (congestive heart failure) (HCC)    CKD (chronic kidney disease), stage III (HCC)    COPD (chronic obstructive pulmonary disease) (HCC)    Depression    Essential hypertension    GERD (gastroesophageal reflux disease)    Hyperlipidemia LDL goal <70    Ischemic cardiomyopathy    a. 01/2019 Echo: EF 40-45%, impaired relaxation. Mildly dil LA. Sev mitral annular Ca2+. Mild to mod AS.   PAD (peripheral artery disease) ([place])    a. 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   PAF (paroxysmal atrial fibrillation) (HCC)    a. CHA2DS2VASc = 4-->Xarelto 15mg  daily in setting of CKD.   Thyrotoxicosis    Tobacco abuse    Type II diabetes mellitus (HCC)     Medications:  Apixaban 5mg  BID for Atrial Fibrillation  Assessment: Pharmacy has been consulted to initiate Heparin in 76yo patient presenting to the ED with chest pain and shortness of breath that  has been going on for roughly 24 hours. Patient has history of atrial fibrillation and is on apixaban 5mg  twice daily, with his last dose being taken on 1/17@~2000. Baseline labs: aPTT 34 sec, INR 1.2, Hgb 9.8, Plts 220.  Goal of Therapy:  Heparin level 0.3-0.7 units/ml Monitor platelets by anticoagulation protocol: Yes   Plan:  Give 4000 units bolus x 1 Start heparin infusion at 800 units/hr Check aPTT level in 8 hours and HL daily while on heparin until levels correlate. Then will transition to HL dosing. Continue to monitor H&H and platelets  Dandy Lazaro A Ceriah Kohler 11/08/2021,5:32 PM

## 2021-11-08 NOTE — ED Triage Notes (Signed)
Pt comes via EMs from [facility] with c/o CP. Pt states CP started 0900. VSS per EMs.  Pt states mid sternal CP at a 6/10 all day. Pt states increased SOb.  Pt states he 1 nitro with no relief.

## 2021-11-08 NOTE — ED Notes (Signed)
Pt asked to be placed back on oxygen for comfort. This had been ok'd by EDP. Placed on 2L.

## 2021-11-09 ENCOUNTER — Encounter: Payer: Self-pay | Admitting: Internal Medicine

## 2021-11-09 DIAGNOSIS — I200 Unstable angina: Secondary | ICD-10-CM

## 2021-11-09 DIAGNOSIS — I739 Peripheral vascular disease, unspecified: Secondary | ICD-10-CM

## 2021-11-09 DIAGNOSIS — I480 Paroxysmal atrial fibrillation: Secondary | ICD-10-CM

## 2021-11-09 DIAGNOSIS — I5023 Acute on chronic systolic (congestive) heart failure: Secondary | ICD-10-CM

## 2021-11-09 DIAGNOSIS — D649 Anemia, unspecified: Secondary | ICD-10-CM

## 2021-11-09 DIAGNOSIS — I420 Dilated cardiomyopathy: Secondary | ICD-10-CM

## 2021-11-09 LAB — BASIC METABOLIC PANEL
Anion gap: 11 (ref 5–15)
BUN: 18 mg/dL (ref 8–23)
CO2: 21 mmol/L — ABNORMAL LOW (ref 22–32)
Calcium: 8.6 mg/dL — ABNORMAL LOW (ref 8.9–10.3)
Chloride: 110 mmol/L (ref 98–111)
Creatinine, Ser: 1.49 mg/dL — ABNORMAL HIGH (ref 0.61–1.24)
GFR, Estimated: 48 mL/min — ABNORMAL LOW (ref >60)
Glucose, Bld: 151 mg/dL — ABNORMAL HIGH (ref 70–99)
Potassium: 3.4 mmol/L — ABNORMAL LOW (ref 3.5–5.1)
Sodium: 142 mmol/L (ref 135–145)

## 2021-11-09 LAB — GLUCOSE, CAPILLARY
Glucose-Capillary: 148 mg/dL — ABNORMAL HIGH (ref 70–99)
Glucose-Capillary: 218 mg/dL — ABNORMAL HIGH (ref 70–99)
Glucose-Capillary: 133 mg/dL — ABNORMAL HIGH (ref 70–99)

## 2021-11-09 LAB — HEPARIN LEVEL (UNFRACTIONATED): Heparin Unfractionated: >1.1 IU/mL — ABNORMAL HIGH (ref 0.30–0.70)

## 2021-11-09 LAB — MAGNESIUM: Magnesium: 2.1 mg/dL (ref 1.7–2.4)

## 2021-11-09 LAB — TROPONIN I (HIGH SENSITIVITY): Troponin I (High Sensitivity): 283 ng/L (ref <18)

## 2021-11-09 LAB — APTT
aPTT: 57 seconds — ABNORMAL HIGH (ref 24–36)
aPTT: 58 seconds — ABNORMAL HIGH (ref 24–36)
aPTT: 70 seconds — ABNORMAL HIGH (ref 24–36)

## 2021-11-09 MED ORDER — HEPARIN BOLUS VIA INFUSION
1000.0000 [IU] | Freq: Once | INTRAVENOUS | Status: AC
  Administered 2021-11-09: 1000 [IU] via INTRAVENOUS
  Filled 2021-11-09: qty 1000

## 2021-11-09 MED ORDER — NITROGLYCERIN 2 % TD OINT
0.5000 [in_us] | TOPICAL_OINTMENT | Freq: Once | TRANSDERMAL | Status: AC
  Administered 2021-11-10: 0.5 [in_us] via TOPICAL
  Filled 2021-11-09: qty 1

## 2021-11-09 MED ORDER — ENSURE MAX PROTEIN PO LIQD
11.0000 [oz_av] | Freq: Every day | ORAL | Status: DC
  Administered 2021-11-09: 11 [oz_av] via ORAL
  Filled 2021-11-09: qty 330

## 2021-11-09 MED ORDER — ADULT MULTIVITAMIN W/MINERALS CH
1.0000 | ORAL_TABLET | Freq: Every day | ORAL | Status: DC
  Administered 2021-11-09 – 2021-11-10 (×2): 1 via ORAL
  Filled 2021-11-09 (×2): qty 1

## 2021-11-09 MED ORDER — GLUCOSE 4 G PO CHEW
CHEWABLE_TABLET | ORAL | Status: AC
  Filled 2021-11-09: qty 2

## 2021-11-09 MED ORDER — GLUCERNA SHAKE PO LIQD
237.0000 mL | Freq: Two times a day (BID) | ORAL | Status: DC
  Administered 2021-11-09 – 2021-11-10 (×3): 237 mL via ORAL

## 2021-11-09 MED ORDER — POTASSIUM CHLORIDE CRYS ER 20 MEQ PO TBCR
40.0000 meq | EXTENDED_RELEASE_TABLET | Freq: Once | ORAL | Status: AC
  Administered 2021-11-09: 40 meq via ORAL
  Filled 2021-11-09: qty 2

## 2021-11-09 NOTE — Progress Notes (Signed)
Initial Nutrition Assessment  DOCUMENTATION CODES:   Not applicable  INTERVENTION:   -MVI with minerals daily -Ensure Enlive po BID, each supplement provides 350 kcal and 20 grams of protein   NUTRITION DIAGNOSIS:   Moderate Malnutrition related to chronic illness (CHF) as evidenced by energy intake < or equal to 75% for > or equal to 1 month, moderate fat depletion, moderate muscle depletion.  GOAL:   Patient will meet greater than or equal to 90% of their needs  MONITOR:   PO intake, Supplement acceptance, Labs, Weight trends, Skin, I & O's  REASON FOR ASSESSMENT:   Malnutrition Screening Tool    ASSESSMENT:   Shawn Jennings is a 76 y.o. male with medical history significant for type II DM, hypertension, CAD s/p CABG, chronic systolic CHF (ischemic cardiomyopathy EF 20 to 25%), COPD, CKD stage III, aortic stenosis, paroxysmal atrial fibrillation on Eliquis, history of GI bleed, history of COVID-19 infection, PVD, thyrotoxicosis, bipolar disorder, tobacco use disorder.  He presented to the hospital because of sudden onset of severe chest pain that started around 9:30 AM today.  He said chest pain or occurred while he was at rest.  He describes the pain as substernal and crushing in nature as to heavy object was standing on his chest.  It was nonradiating but respiratory rate shortness of breath.  He only got little relief when he was given aspirin and nitroglycerin in the emergency room.  Pt admitted with NSTEMI and CHF.   Reviewed I/O's: -805 ml x 24 hours   UOP: 950 ml x 24 hours  Spoke with pt at bedside, who reports feeling a lot better today. He reports he consumed 100% of his breakfast this morning and is waiting on his lunch. PTA pt reports very poor oral intake over the past 6 months due to very poor appetite, not having an appetite or taste of foods. He prefers sweet things and often only consumes one meal per day (apple pie).    Reviewed wt hx; pt has experienced a  16.3% wt loss over the past year, which is not significant for time frame. Pt reports his UBW is 160# and has lost about 17# over the past 6 months.   Discussed importance of good meal and supplement intake to promote healing. Pt amenable to supplements; he reports he was not consuming them PTA as he could not afford them.   Medications reviewed and include vitamin D3, lasix, melatonin, and heparin.   Labs reviewed: K: 3.4, CBGS: 536-644 (inpatient orders for glycemic control are 0-9 units insulin aspart TID with meals).    NUTRITION - FOCUSED PHYSICAL EXAM:  Flowsheet Row Most Recent Value  Orbital Region Moderate depletion  Upper Arm Region Moderate depletion  Thoracic and Lumbar Region Mild depletion  Buccal Region Moderate depletion  Temple Region Moderate depletion  Clavicle Bone Region Moderate depletion  Clavicle and Acromion Bone Region Moderate depletion  Scapular Bone Region Moderate depletion  Dorsal Hand Mild depletion  Patellar Region Moderate depletion  Anterior Thigh Region Moderate depletion  Posterior Calf Region Moderate depletion  Edema (RD Assessment) None  Hair Reviewed  Eyes Reviewed  Mouth Reviewed  Skin Reviewed  Nails Reviewed       Diet Order:   Diet Order             Diet heart healthy/carb modified Room service appropriate? Yes; Fluid consistency: Thin  Diet effective now  EDUCATION NEEDS:   Education needs have been addressed  Skin:  Skin Assessment: Reviewed RN Assessment  Last BM:  11/05/21  Height:   Ht Readings from Last 1 Encounters:  11/09/21 6' (1.829 m)    Weight:   Wt Readings from Last 1 Encounters:  11/09/21 63.8 kg    Ideal Body Weight:  80.9 kg  BMI:  Body mass index is 19.08 kg/m.  Estimated Nutritional Needs:   Kcal:  1900-2100  Protein:  95-110 grams  Fluid:  > 1.9 L    Loistine Chance, RD, LDN, [place] Registered Dietitian II Certified Diabetes Care and Education Specialist Please  refer to [facility] for RD and/or RD on-call/weekend/after hours pager

## 2021-11-09 NOTE — Consult Note (Signed)
ANTICOAGULATION CONSULT NOTE - Initial Consult  Pharmacy Consult for Heparin Indication: chest pain/ACS  No Known Allergies  Patient Measurements: Height: 6' (182.9 cm) Weight: 63.8 kg (140 lb 10.5 oz) IBW/kg (Calculated) : 77.6 Heparin Dosing Weight: 65 kg  Vital Signs: Temp: 98 F (36.7 C) (01/18 2300) Temp Source: Oral (01/18 2300) BP: 111/57 (01/19 0122) Pulse Rate: 62 (01/19 0122)  Labs: Recent Labs    11/08/21 1408 11/08/21 1539 11/09/21 0035  HGB 9.8*  --   --   HCT 32.6*  --   --   PLT 220  --   --   APTT  --  34 70*  LABPROT  --  15.6*  --   INR  --  1.2  --   CREATININE 1.29*  --   --   TROPONINIHS 254*  --  283*     Estimated Creatinine Clearance: 43.3 mL/min (A) (by C-G formula based on SCr of 1.29 mg/dL (H)).   Medical History: Past Medical History:  Diagnosis Date   Aortic stenosis    a. Pt unaware of history but CT imaging 01/2019 consistent w/ AVR; b. 01/2019 Echo: Mild to mod AS. Mean grad 51mmHg.   CAD (coronary artery disease)    a. 1994 s/p CABG ([place]); b. Reports multiple stress tests over the years w/o repeat cath; c. 01/2019 MV: large, sev, fixed inf and inflat defect extending to apex. No ischemia. EF 37%-->Med rx given atypical Ss and pt wishes.   CHF (congestive heart failure) (HCC)    CKD (chronic kidney disease), stage III (HCC)    COPD (chronic obstructive pulmonary disease) (HCC)    Depression    Essential hypertension    GERD (gastroesophageal reflux disease)    Hyperlipidemia LDL goal <70    Ischemic cardiomyopathy    a. 01/2019 Echo: EF 40-45%, impaired relaxation. Mildly dil LA. Sev mitral annular Ca2+. Mild to mod AS.   PAD (peripheral artery disease) ([place])    a. 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   PAF (paroxysmal atrial fibrillation) (HCC)    a. CHA2DS2VASc = 4-->Xarelto 15mg  daily in setting of CKD.   Thyrotoxicosis    Tobacco abuse    Type II diabetes mellitus (HCC)     Medications:  Apixaban 5mg  BID for Atrial  Fibrillation  Assessment: Pharmacy has been consulted to initiate Heparin in 75yo patient presenting to the ED with chest pain and shortness of breath that has been going on for roughly 24 hours. Patient has history of atrial fibrillation and is on apixaban 5mg  twice daily, with his last dose being taken on 1/17@~2000. Baseline labs: aPTT 34 sec, INR 1.2, Hgb 9.8, Plts 220.  Goal of Therapy:  Heparin level 0.3-0.7 units/ml Monitor platelets by anticoagulation protocol: Yes   Plan:  1/19:  aPTT @ 0035 = 70, therapeutic X 1  Will continue pt on current rate and recheck aPTT and HL in 8 hrs. Will use aPTT to guide dosing until HL and aPTT are therapeutic.   Yovanna Cogan D 11/09/2021,1:26 AM

## 2021-11-09 NOTE — Progress Notes (Addendum)
Progress Note    Shawn Jennings  JSH:[id number] DOB: [birth year]  DOA: 11/08/2021 PCP: [facility]      Brief Narrative:    Medical records reviewed and are as summarized below:  Shawn Jennings is a 76 y.o. male with medical history significant for type II DM, hypertension, CAD s/p CABG, chronic systolic CHF (ischemic cardiomyopathy EF 20 to 25%), COPD, CKD stage III, aortic stenosis, paroxysmal atrial fibrillation on Eliquis, history of GI bleed, history of COVID-19 infection, PVD, thyrotoxicosis, bipolar disorder, tobacco use disorder.  He presented to the hospital because of chest pain associated with shortness of breath.  BNP was elevated at 2,623 and troponins were elevated as well (254, 283).   He was admitted to the hospital for probable NSTEMI and acute on chronic systolic and diastolic CHF.         Assessment/Plan:   Principal Problem:   NSTEMI (non-ST elevated myocardial infarction) ([place]) Active Problems:   Chronic kidney disease, stage 3a (HCC)   AF (paroxysmal atrial fibrillation) (HCC)   Acute on chronic systolic CHF (congestive heart failure) (HCC)   Anemia   Body mass index is 19.08 kg/m.  Acute NSTEMI versus chest pain with elevated troponins from demand ischemia, CAD with previous CABG: Continue aspirin, Lipitor and IV heparin infusion up to 48 hours total.  Monitor heparin level per protocol.  Follow-up with cardiologist for further recommendations.  Acute on chronic systolic CHF: Continue IV Lasix, carvedilol and Entresto.  Monitor BMP, daily weight and urine output.  2D echo in October 2022 showed EF estimated at 20 to 25%  Hypokalemia: Replete potassium and monitor BMP  Paroxysmal atrial fibrillation: Hold Eliquis while on IV heparin.  Type II DM: Hold [place].  Use NovoLog as needed for hyperglycemia.  Other comorbidities include CKD stage IIIa, depression, hypertension, PAD, COPD, aortic stenosis, bipolar disorder, tobacco  use disorder   Patient has been counseled to quit smoking cigarettes.   Diet Order             Diet heart healthy/carb modified Room service appropriate? Yes; Fluid consistency: Thin  Diet effective now                      Consultants: Cardiologist  Procedures: None    Medications:    aspirin EC  81 mg Oral Daily   atorvastatin  80 mg Oral QHS   carvedilol  3.125 mg Oral BID   cholecalciferol  1,000 Units Oral QPM   DULoxetine  30 mg Oral BID   furosemide  20 mg Intravenous BID   glucose       insulin aspart  0-9 Units Subcutaneous TID WC   isosorbide mononitrate  30 mg Oral Daily   lamoTRIgine  25 mg Oral BID   loratadine  10 mg Oral Daily   melatonin  5 mg Oral QHS   montelukast  10 mg Oral QHS   pantoprazole  40 mg Oral Daily   sacubitril-valsartan  1 tablet Oral BID   Continuous Infusions:  heparin 900 Units/hr (11/09/21 0811)   nitroGLYCERIN Stopped (11/09/21 0755)     Anti-infectives (From admission, onward)    None              Family Communication/Anticipated D/C date and plan/Code Status   DVT prophylaxis:      Code Status: Full Code  Family Communication: None Disposition Plan: Plan to discharge back to ALF in 1 to 2 days  Status is: Inpatient  Remains inpatient appropriate because: IV heparin           Subjective:   Interval events noted.  No chest pain.  Breathing is better today.  Objective:    Vitals:   11/09/21 0600 11/09/21 0630 11/09/21 0700 11/09/21 1119  BP: 113/68 120/79 115/75 111/71  Pulse: 66 69 65 68  Resp: (!) 22 (!) 21 (!) 23   Temp:      TempSrc:      SpO2: 95% 97%  98%  Weight:      Height:       No data found.   Intake/Output Summary (Last 24 hours) at 11/09/2021 1304 Last data filed at 11/09/2021 1005 Gross per 24 hour  Intake 385.39 ml  Output 1550 ml  Net -1164.61 ml   Filed Weights   11/08/21 1505 11/09/21 0059  Weight: 65 kg 63.8 kg    Exam:  GEN: NAD SKIN:  No rash EYES: EOMI ENT: MMM CV: RRR PULM: Mild rhonchi at the lung bases.  No rales heard ABD: soft, ND, NT, +BS CNS: AAO x 3, non focal EXT: Left AKA.  No edema or tenderness        Data Reviewed:   I have personally reviewed following labs and imaging studies:  Labs: Labs show the following:   Basic Metabolic Panel: Recent Labs  Lab 11/08/21 1408 11/09/21 0701  NA 139 142  K 3.8 3.4*  CL 111 110  CO2 22 21*  GLUCOSE 148* 151*  BUN 17 18  CREATININE 1.29* 1.49*  CALCIUM 8.6* 8.6*  MG  --  2.1   GFR Estimated Creatinine Clearance: 37.5 mL/min (A) (by C-G formula based on SCr of 1.49 mg/dL (H)). Liver Function Tests: No results for input(s): AST, ALT, ALKPHOS, BILITOT, PROT, ALBUMIN in the last 168 hours. No results for input(s): LIPASE, AMYLASE in the last 168 hours. No results for input(s): AMMONIA in the last 168 hours. Coagulation profile Recent Labs  Lab 11/08/21 1539  INR 1.2    CBC: Recent Labs  Lab 11/08/21 1408  WBC 8.3  HGB 9.8*  HCT 32.6*  MCV 74.6*  PLT 220   Cardiac Enzymes: No results for input(s): CKTOTAL, CKMB, CKMBINDEX, TROPONINI in the last 168 hours. BNP (last 3 results) No results for input(s): PROBNP in the last 8760 hours. CBG: Recent Labs  Lab 11/08/21 2355 11/09/21 0724 11/09/21 1119  GLUCAP 140* 148* 218*   D-Dimer: No results for input(s): DDIMER in the last 72 hours. Hgb A1c: No results for input(s): HGBA1C in the last 72 hours. Lipid Profile: No results for input(s): CHOL, HDL, LDLCALC, TRIG, CHOLHDL, LDLDIRECT in the last 72 hours. Thyroid function studies: No results for input(s): TSH, T4TOTAL, T3FREE, THYROIDAB in the last 72 hours.  Invalid input(s): FREET3 Anemia work up: No results for input(s): VITAMINB12, FOLATE, FERRITIN, TIBC, IRON, RETICCTPCT in the last 72 hours. Sepsis Labs: Recent Labs  Lab 11/08/21 1408 11/08/21 1529  PROCALCITON  --  <0.10  WBC 8.3  --     Microbiology Recent  Results (from the past 240 hour(s))  Blood culture (routine x 2)     Status: None (Preliminary result)   Collection Time: 11/08/21  3:29 PM   Specimen: BLOOD  Result Value Ref Range Status   Specimen Description BLOOD BLOOD LEFT FOREARM  Final   Special Requests   Final    BOTTLES DRAWN AEROBIC AND ANAEROBIC Blood Culture results may not be optimal due to  an excessive volume of blood received in culture bottles   Culture   Final    NO GROWTH < 24 HOURS Performed at [facility], [address] [place], [place] [address]    Report Status PENDING  Incomplete  Blood culture (routine x 2)     Status: None (Preliminary result)   Collection Time: 11/08/21  3:29 PM   Specimen: BLOOD  Result Value Ref Range Status   Specimen Description BLOOD BLOOD LEFT HAND  Final   Special Requests   Final    BOTTLES DRAWN AEROBIC AND ANAEROBIC Blood Culture adequate volume   Culture   Final    NO GROWTH < 24 HOURS Performed at [facility], [address], [place], [place] [address]    Report Status PENDING  Incomplete  Resp Panel by RT-PCR (Flu A&B, Covid) Nasopharyngeal Swab     Status: None   Collection Time: 11/08/21  3:29 PM   Specimen: Nasopharyngeal Swab; Nasopharyngeal(NP) swabs in vial transport medium  Result Value Ref Range Status   SARS Coronavirus 2 by RT PCR NEGATIVE NEGATIVE Final    Comment: (NOTE) SARS-CoV-2 target nucleic acids are NOT DETECTED.  The SARS-CoV-2 RNA is generally detectable in upper respiratory specimens during the acute phase of infection. The lowest concentration of SARS-CoV-2 viral copies this assay can detect is 138 copies/mL. A negative result does not preclude SARS-Cov-2 infection and should not be used as the sole basis for treatment or other patient management decisions. A negative result may occur with  improper specimen collection/handling, submission of specimen other than nasopharyngeal swab, presence of viral mutation(s) within  the areas targeted by this assay, and inadequate number of viral copies(<138 copies/mL). A negative result must be combined with clinical observations, patient history, and epidemiological information. The expected result is Negative.  Fact Sheet for Patients:  EntrepreneurPulse.com.au  Fact Sheet for Healthcare Providers:  IncredibleEmployment.be  This test is no t yet approved or cleared by the [place] FDA and  has been authorized for detection and/or diagnosis of SARS-CoV-2 by FDA under an Emergency Use Authorization (EUA). This EUA will remain  in effect (meaning this test can be used) for the duration of the COVID-19 declaration under Section 564(b)(1) of the Act, 21 U.S.C.section 360bbb-3(b)(1), unless the authorization is terminated  or revoked sooner.       Influenza A by PCR NEGATIVE NEGATIVE Final   Influenza B by PCR NEGATIVE NEGATIVE Final    Comment: (NOTE) The Xpert Xpress SARS-CoV-2/FLU/RSV plus assay is intended as an aid in the diagnosis of influenza from Nasopharyngeal swab specimens and should not be used as a sole basis for treatment. Nasal washings and aspirates are unacceptable for Xpert Xpress SARS-CoV-2/FLU/RSV testing.  Fact Sheet for Patients: EntrepreneurPulse.com.au  Fact Sheet for Healthcare Providers: IncredibleEmployment.be  This test is not yet approved or cleared by the [place] FDA and has been authorized for detection and/or diagnosis of SARS-CoV-2 by FDA under an Emergency Use Authorization (EUA). This EUA will remain in effect (meaning this test can be used) for the duration of the COVID-19 declaration under Section 564(b)(1) of the Act, 21 U.S.C. section 360bbb-3(b)(1), unless the authorization is terminated or revoked.  Performed at [facility], [address] [place],  [address]     Procedures and diagnostic studies:  DG Chest  2 View  Result Date: 11/08/2021 CLINICAL DATA:  Chest pain EXAM: CHEST - 2 VIEW COMPARISON:  Chest radiograph 08/03/2021 FINDINGS: Median sternotomy wires, aortic  valve prosthesis, and mediastinal surgical clips are again noted, with multiple fractured sternotomy wires. The heart is enlarged, unchanged. The upper mediastinal contours are stable. There are patchy opacities in the right lower lobe with small right larger than left pleural effusions. There is no pneumothorax. There is no acute osseous abnormality. A remote fracture of the left clavicle is again seen. IMPRESSION: 1. Patchy opacities in the right base and small right larger than left pleural effusions suspicious for pneumonia in the correct clinical setting. Recommend follow-up radiographs in 6-8 weeks to assess for resolution. 2. Unchanged cardiomegaly. Electronically Signed   By: Valetta Mole M.D.   On: 11/08/2021 14:36               LOS: 1 day   Elsie Baynes  [facility]   Pager on [email or web address]. If 7PM-7AM, please contact night-coverage at [email or web address]     11/09/2021, 1:04 PM

## 2021-11-09 NOTE — Consult Note (Signed)
° °  Heart Failure Nurse Navigator Note  HFrEF 20 to 25%.  Resented to the emergency room from assisted living after complaining of chest pain unrelieved by nitroglycerin.  Comorbidities:  Coronary artery disease/CABG Aortic valve replacement Chronic kidney disease Diabetes COPD Continued tobacco abuse Paroxysmal atrial fibrillation  Medications:  Aspirin 81 mg daily Atorvastatin 80 mg at bedtime Carvedilol 3.125 mg 2 times daily Furosemide 20 mg IV twice a day Isosorbide mononitrate 30 mg Entresto 24/26 mg 2 times a day Heparin infusion   Labs:  Sodium 142, potassium 3.4, chloride 110, CO2 21, BUN 18, creatinine 1.49 Weight is 63.8 kg Blood pressure 111/71   Initial meeting with patient on this admission.  He had last been seen in October 2022 after he presented to the emergency room with complaints of worsening shortness of breath and chest pain.  He states that he resides in an assisted living.  He admits to being not very active but likes playing bingo.  He states that he continues to smoke approximately 6 cigarettes a day.  Went over fluid restriction he states that he will drink a couple cans of soda and 2 bottled waters along with the milk that he drinks  with his meals.  He does not add salt at the table.  He states that the facility that he just gets weighed once a week.  Stressed the importance of daily weights.  He states prior to admission it was hard for him to lie flat due to being short of breath.  We will continue to follow along.  Pricilla Riffle RN CHFN

## 2021-11-09 NOTE — Consult Note (Signed)
[facility] for Heparin Indication: chest pain/ACS  No Known Allergies  Patient Measurements: Height: 6' (182.9 cm) Weight: 63.8 kg (140 lb 10.5 oz) IBW/kg (Calculated) : 77.6 Heparin Dosing Weight: 65 kg  Vital Signs: BP: 111/71 (01/19 1119) Pulse Rate: 68 (01/19 1119)  Labs: Recent Labs    11/08/21 1408 11/08/21 1539 11/09/21 0035 11/09/21 0701  HGB 9.8*  --   --   --   HCT 32.6*  --   --   --   PLT 220  --   --   --   APTT  --  34 70* 58*  LABPROT  --  15.6*  --   --   INR  --  1.2  --   --   HEPARINUNFRC  --   --   --  >1.10*  CREATININE 1.29*  --   --  1.49*  TROPONINIHS 254*  --  283*  --      Estimated Creatinine Clearance: 37.5 mL/min (A) (by C-G formula based on SCr of 1.49 mg/dL (H)).   Medical History: Past Medical History:  Diagnosis Date   Aortic stenosis    a. Pt unaware of history but CT imaging 01/2019 consistent w/ AVR; b. 01/2019 Echo: Mild to mod AS. Mean grad 77mmHg.   CAD (coronary artery disease)    a. 1994 s/p CABG ([place]); b. Reports multiple stress tests over the years w/o repeat cath; c. 01/2019 MV: large, sev, fixed inf and inflat defect extending to apex. No ischemia. EF 37%-->Med rx given atypical Ss and pt wishes.   CHF (congestive heart failure) (HCC)    CKD (chronic kidney disease), stage III (HCC)    COPD (chronic obstructive pulmonary disease) (HCC)    Depression    Essential hypertension    GERD (gastroesophageal reflux disease)    Hyperlipidemia LDL goal <70    Ischemic cardiomyopathy    a. 01/2019 Echo: EF 40-45%, impaired relaxation. Mildly dil LA. Sev mitral annular Ca2+. Mild to mod AS.   PAD (peripheral artery disease) ([place])    a. 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   PAF (paroxysmal atrial fibrillation) (HCC)    a. CHA2DS2VASc = 4-->Xarelto 15mg  daily in setting of CKD.   Thyrotoxicosis    Tobacco abuse    Type II diabetes mellitus (HCC)     Medications:  Apixaban 5mg  BID for Atrial  Fibrillation  Assessment: Pharmacy has been consulted to initiate Heparin in 75yo patient presenting to the ED with chest pain and shortness of breath that has been going on for roughly 24 hours. Patient has history of atrial fibrillation and is on apixaban 5mg  twice daily, with his last dose being taken on 1/17@~2000. Will use aPTT monitoring.   Goal of Therapy:  Heparin level 0.3-0.7 units/ml once aPTT and heparin level correlate.  aPTT 66-102 seconds Monitor platelets by anticoagulation protocol: Yes   Plan:  aPTT is subtherapeutic despite rate increase: Will give a heparin bolus of 1000 units and increase heparin infusion to 1050 units/hr. Recheck aPTT in 8 hours after rate change CBC and heparin level daily while on heparin.  Switch to heparin level monitoring once aPTT and heparin level correlate.   Dallie Piles, PharmD, BCPS 11/09/2021,3:45 PM

## 2021-11-09 NOTE — Consult Note (Signed)
Cardiology Consultation:   Patient ID: Shawn Jennings; [id number]; [birth year]   Admit date: 11/08/2021 Date of Consult: 11/09/2021  Primary Care Provider: [facility] Primary Cardiologist: Fletcher Anon Primary Electrophysiologist:  None   Patient Profile:   Shawn Jennings is a 76 y.o. male with a hx of CAD status post CABG in 1994 in [place], [place], chronic chest pain, HFrEF secondary to ICM, PAF, aortic stenosis status post AVR, PAD status post left AKA, CKD stage III, DM2, HTN, HLD, COPD, tobacco use, GERD, and depression who is being seen today for the evaluation of elevated troponin at the request of Dr. Mal Misty.  History of Present Illness:   Shawn Jennings has a long history of chest pain over the years.  He was admitted to [facility] in 01/2019 with chest pain.  Stress testing was potentially high risk with large severe fixed inferior and inferolateral defect extends to the apex.  No ischemia noted.  EF 37%.  Echo with EF 40 to 45%.  He preferred to avoid cardiac cath and he had been medically managed since.  He was admitted in 02/2020 with recurrent atypical chest pain that was worse with deep breathing.  Troponins were normal.  He was seen by our team with recommendation for follow-up echo which was not performed.  He underwent barium esophagram that showed small hiatal hernia.  Subsequent recommended echo was obtained in 03/2020 and demonstrated a newly reduced LV systolic function with an EF of 25 to 30%, global hypokinesis, mild LVH, grade 1 diastolic dysfunction, normal RV systolic function and ventricular cavity size, bioprosthetic aortic valve with normal function, and borderline dilatation of the aortic root measuring 38 mm.  Subsequent R/LHC in 06/06/20 showed severe underlying coronary disease with patent grafts and RHC with normal filling pressure.  He was admitted in 09/2020 with choledocholithiasis and underwent ERCP.  He was seen in 10/2020 in the ED with chest pain.  He was  admitted in 06/2021 with an upper GI bleed requiring 2 units of packed red blood cells.  EGD demonstrated erosive gastropathy with no acute bleeding.  Colonoscopy showed nonbleeding hemorrhoids.  GI did clear the patient to resume Eliquis.  He was admitted in 07/2021 with COVID and acute on chronic HFrEF.  He was diuresed with symptomatic improvement.  He was admitted on 11/08/2021 with sudden onset of chest pain and dyspnea at rest that began earlier that morning.Marland Kitchen  He has reported needing to take 1 sublingual nitroglycerin on a daily basis at his living facility.  EKG did demonstrate slightly more pronounced inferolateral ST-T changes when compared to prior tracings.  High-sensitivity troponin of 283.  BNP 2623.  Hemoglobin low, though largely stable at 9.8.  Chest x-ray showed small right larger than left pleural effusions, potentially suspicious for pneumonia.  Overnight, due to chest pain he did require a nitro drip that has subsequently been discontinued.  He has been IV diuresed with a net -1.1 L for the admission.  His weight is down a little over 1 kg.  Resolution of his chest pain and significant improvement in his dyspnea.  He denies any lower extremity or stump swelling.  No abdominal distention or orthopnea.     Past Medical History:  Diagnosis Date   Aortic stenosis    a. Pt unaware of history but CT imaging 01/2019 consistent w/ AVR; b. 01/2019 Echo: Mild to mod AS. Mean grad 34mmHg.   CAD (coronary artery disease)    a. 1994 s/p CABG ([place]); b. Reports multiple stress tests over the years w/o repeat cath; c. 01/2019 MV: large, sev, fixed inf and inflat defect extending to apex. No ischemia. EF 37%-->Med rx given atypical Ss and pt wishes.   CHF (congestive heart failure) (HCC)    CKD (chronic kidney disease), stage III (HCC)    COPD (chronic obstructive pulmonary disease) (HCC)    Depression    Essential hypertension    GERD (gastroesophageal reflux disease)    Hyperlipidemia LDL  goal <70    Ischemic cardiomyopathy    a. 01/2019 Echo: EF 40-45%, impaired relaxation. Mildly dil LA. Sev mitral annular Ca2+. Mild to mod AS.   PAD (peripheral artery disease) ([place])    a. 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   PAF (paroxysmal atrial fibrillation) (HCC)    a. CHA2DS2VASc = 4-->Xarelto 15mg  daily in setting of CKD.   Thyrotoxicosis    Tobacco abuse    Type II diabetes mellitus ([place])     Past Surgical History:  Procedure Laterality Date   CARDIAC CATHETERIZATION     CHOLECYSTECTOMY     COLONOSCOPY     COLONOSCOPY WITH PROPOFOL N/A 07/20/2021   Procedure: COLONOSCOPY WITH PROPOFOL;  Surgeon: Lin Landsman, MD;  Location: [facility] ENDOSCOPY;  Service: Gastroenterology;  Laterality: N/A;   CORONARY ARTERY BYPASS GRAFT     a. [place]   ENDOSCOPIC RETROGRADE CHOLANGIOPANCREATOGRAPHY (ERCP) WITH PROPOFOL N/A 10/19/2020   Procedure: ENDOSCOPIC RETROGRADE CHOLANGIOPANCREATOGRAPHY (ERCP) WITH PROPOFOL;  Surgeon: [name], MD;  Location: [facility] ENDOSCOPY;  Service: Endoscopy;  Laterality: N/A;   ERCP N/A 12/29/2020   Procedure: ENDOSCOPIC RETROGRADE CHOLANGIOPANCREATOGRAPHY (ERCP);  Surgeon: [name], MD;  Location: [facility] ENDOSCOPY;  Service: Endoscopy;  Laterality: N/A;   ESOPHAGOGASTRODUODENOSCOPY (EGD) WITH PROPOFOL N/A 07/18/2021   Procedure: ESOPHAGOGASTRODUODENOSCOPY (EGD) WITH PROPOFOL;  Surgeon: Lin Landsman, MD;  Location: [facility] ENDOSCOPY;  Service: Gastroenterology;  Laterality: N/A;   Left AKA     RIGHT/LEFT HEART CATH AND CORONARY ANGIOGRAPHY N/A 06/06/2020   Procedure: RIGHT/LEFT HEART CATH AND CORONARY ANGIOGRAPHY;  Surgeon: Wellington Hampshire, MD;  Location: [facility] CV LAB;  Service: Cardiovascular;  Laterality: N/A;     Home Meds: Prior to Admission medications   Medication Sig Start Date End Date Taking? Authorizing Provider  apixaban (ELIQUIS) 5 MG TABS tablet Take 1 tablet (5 mg total) by mouth 2 (two) times daily. 07/22/21  Yes Wieting, Richard, MD   atorvastatin (LIPITOR) 80 MG tablet Take 80 mg by mouth at bedtime.   Yes [provider]  carvedilol (COREG) 3.125 MG tablet Take 1 tablet (3.125 mg total) by mouth 2 (two) times daily. 08/23/21 11/21/21 Yes Hackney, Otila Kluver A, FNP  cholecalciferol (VITAMIN D3) 25 MCG (1000 UNIT) tablet Take 1,000 Units by mouth every evening.   Yes [provider]  dapagliflozin propanediol (FARXIGA) 5 MG TABS tablet Take 5 mg by mouth daily.   Yes [provider]  DULoxetine (CYMBALTA) 30 MG capsule Take 30 mg by mouth 2 (two) times daily.   Yes [provider]  ferrous sulfate 325 (65 FE) MG tablet Take 325 mg by mouth every other day.   Yes [provider]  isosorbide mononitrate (IMDUR) 30 MG 24 hr tablet Take 1 tablet (30 mg total) by mouth daily. 03/11/20 05/27/29 Yes Sharen Hones, MD  lamoTRIgine (LAMICTAL) 25 MG tablet Take 25 mg by mouth 2 (two) times daily.   Yes [provider]  loratadine (CLARITIN) 10 MG tablet Take 10 mg by  mouth daily.   Yes [provider]  Melatonin 3 MG TABS Take 1 tablet by mouth at bedtime.   Yes [provider]  montelukast (SINGULAIR) 10 MG tablet Take 10 mg by mouth at bedtime.   Yes [provider]  Multiple Vitamins-Minerals (PRESERVISION AREDS) CAPS Take 1 capsule by mouth in the morning and at bedtime.   Yes [provider]  nitroGLYCERIN (NITROSTAT) 0.4 MG SL tablet Place 0.4 mg under the tongue every 5 (five) minutes as needed for chest pain.   Yes [provider]  pantoprazole (PROTONIX) 40 MG tablet Take 40 mg by mouth daily.   Yes [provider]  sacubitril-valsartan (ENTRESTO) 24-26 MG Take 1 tablet by mouth 2 (two) times daily.   Yes [provider]  vitamin B-12 (CYANOCOBALAMIN) 1000 MCG tablet Take 1,000 mcg by mouth daily.   Yes [provider]  acetaminophen (TYLENOL) 500 MG tablet Take 500 mg by mouth every 8 (eight) hours as  needed. Patient not taking: Reported on 11/08/2021    [provider]  albuterol (VENTOLIN HFA) 108 (90 Base) MCG/ACT inhaler Inhale 2 puffs into the lungs every 6 (six) hours as needed for wheezing or shortness of breath.     [provider]  aspirin EC 81 MG tablet Take 81 mg by mouth daily. Swallow whole. Patient not taking: Reported on 08/23/2021    [provider]  furosemide (LASIX) 40 MG tablet Take 40 mg by mouth daily. Patient not taking: Reported on 08/23/2021    [provider]  hydrOXYzine (ATARAX/VISTARIL) 25 MG tablet Take 25 mg by mouth every 6 (six) hours as needed (allergy symptoms).    [provider]  potassium chloride (KLOR-CON) 20 MEQ packet Take 20 mEq by mouth daily. Patient not taking: Reported on 08/23/2021    [provider]  senna (SENOKOT) 8.6 MG tablet Take 1 tablet by mouth daily. Patient not taking: Reported on 08/23/2021    [provider]  tamsulosin (FLOMAX) 0.4 MG CAPS capsule Take 0.4 mg by mouth daily. Patient not taking: Reported on 08/23/2021    [provider]    Inpatient Medications: Scheduled Meds:  aspirin EC  81 mg Oral Daily   atorvastatin  80 mg Oral QHS   carvedilol  3.125 mg Oral BID   cholecalciferol  1,000 Units Oral QPM   DULoxetine  30 mg Oral BID   furosemide  20 mg Intravenous BID   glucose       insulin aspart  0-9 Units Subcutaneous TID WC   isosorbide mononitrate  30 mg Oral Daily   lamoTRIgine  25 mg Oral BID   loratadine  10 mg Oral Daily   melatonin  5 mg Oral QHS   montelukast  10 mg Oral QHS   pantoprazole  40 mg Oral Daily   sacubitril-valsartan  1 tablet Oral BID   Continuous Infusions:  heparin 900 Units/hr (11/09/21 0811)   nitroGLYCERIN Stopped (11/09/21 0755)   PRN Meds: acetaminophen, albuterol, nitroGLYCERIN, ondansetron (ZOFRAN) IV  Allergies:  No Known Allergies  Social History:   Social History   Socioeconomic History   Marital  status: Single    Spouse name: Not on file   Number of children: Not on file   Years of education: Not on file   Highest education level: Not on file  Occupational History   Not on file  Tobacco Use   Smoking status: Every Day    Packs/day: 1.00  Years: 62.00    Pack years: 62.00    Types: Cigarettes   Smokeless tobacco: Never  Vaping Use   Vaping Use: Never used  Substance and Sexual Activity   Alcohol use: Never   Drug use: Never   Sexual activity: Not on file  Other Topics Concern   Not on file  Social History Narrative   Retired from [employer].  From [place], [place]. Moved to [place] ~ 2011 (pt initially said that he moved here in Feb 2020, but then said 9 yrs ago).   Social Determinants of Health   Financial Resource Strain: Not on file  Food Insecurity: Not on file  Transportation Needs: Not on file  Physical Activity: Not on file  Stress: Not on file  Social Connections: Not on file  Intimate Partner Violence: Not on file     Family History:   Family History  Problem Relation Age of Onset   Heart attack Mother        died @ 3.   Other Father        never knew his father.   Other Sister        overdose of sleeping pills.   Heart disease Brother        died in his 57's   Other Sister        complication of abd surgery @ 68   CAD Sister     ROS:  Review of Systems  Constitutional:  Positive for malaise/fatigue. Negative for chills, diaphoresis, fever and weight loss.  HENT:  Negative for congestion.   Eyes:  Negative for discharge and redness.  Respiratory:  Positive for shortness of breath. Negative for cough, sputum production and wheezing.   Cardiovascular:  Positive for chest pain. Negative for palpitations, orthopnea, claudication, leg swelling and PND.  Gastrointestinal:  Negative for abdominal pain, blood in stool, heartburn, melena, nausea and vomiting.  Musculoskeletal:  Negative for falls and myalgias.  Skin:  Negative for rash.  Neurological:   Negative for dizziness, tingling, tremors, sensory change, speech change, focal weakness, loss of consciousness and weakness.  Endo/Heme/Allergies:  Does not bruise/bleed easily.  Psychiatric/Behavioral:  Negative for substance abuse. The patient is not nervous/anxious.   All other systems reviewed and are negative.    Physical Exam/Data:   Vitals:   11/09/21 0600 11/09/21 0630 11/09/21 0700 11/09/21 1119  BP: 113/68 120/79 115/75 111/71  Pulse: 66 69 65 68  Resp: (!) 22 (!) 21 (!) 23   Temp:      TempSrc:      SpO2: 95% 97%  98%  Weight:      Height:        Intake/Output Summary (Last 24 hours) at 11/09/2021 1123 Last data filed at 11/09/2021 1005 Gross per 24 hour  Intake 385.39 ml  Output 1550 ml  Net -1164.61 ml   Filed Weights   11/08/21 1505 11/09/21 0059  Weight: 65 kg 63.8 kg   Body mass index is 19.08 kg/m.   Physical Exam: General: Well developed, well nourished, in no acute distress. Head: Normocephalic, atraumatic, sclera non-icteric, no xanthomas, nares without discharge.  Neck: Negative for carotid bruits. JVD elevated approximately 8 to 10 cm. Lungs: Diminished breath sounds along the bilateral bases.  Breathing is unlabored. Heart: RRR with S1 S2. I/VI systolic murmur, no rubs, or gallops appreciated. Abdomen: Soft, non-tender, non-distended with normoactive bowel sounds. No hepatomegaly. No rebound/guarding. No obvious abdominal masses. Msk:  Strength and tone appear normal for age. Extremities:  Status post left AKA. No edema. Distal pedal pulses are 2+ and equal bilaterally. Neuro: Alert and oriented X 3. No facial asymmetry. No focal deficit. Moves all extremities spontaneously. Psych:  Responds to questions appropriately with a normal affect.   EKG:  The EKG was personally reviewed and demonstrates: NSR, 73 bpm, inferolateral T wave inversion which is slightly more pronounced when compared to prior tracings Telemetry:  Telemetry was personally reviewed  and demonstrates: SR  Weights: Filed Weights   11/08/21 1505 11/09/21 0059  Weight: 65 kg 63.8 kg    Relevant CV Studies:  2D echo 08/03/2021: 1. Left ventricular ejection fraction, by estimation, is 20 to 25%. The  left ventricle has severely decreased function. The left ventricle  demonstrates severe global hypokinesis, anterior wall motion best  preserved. The left ventricular internal cavity  size was moderately dilated.   2. Right ventricular systolic function is normal. The right ventricular  size is normal.   3. Left atrial size was moderately dilated.   4. The mitral valve is normal in structure. Mild mitral valve  regurgitation.   5. The aortic valve was not well visualized. __________  2D echo 10/19/2020: 1. Left ventricular ejection fraction, by estimation, is 50 to 55%. The  left ventricle has low normal function. The left ventricle has no regional  wall motion abnormalities. The left ventricular internal cavity size was  mildly dilated. Left ventricular  diastolic parameters were normal.   2. Right ventricular systolic function is normal. The right ventricular  size is mildly enlarged.   3. Left atrial size was mildly dilated.   4. Right atrial size was mildly dilated.   5. The mitral valve was not well visualized. Trivial mitral valve  regurgitation.   6. The aortic valve was not well visualized. Aortic valve regurgitation  is trivial. Mild aortic valve stenosis. ___________  [facility] 06/06/2020: Prox Cx to Mid Cx lesion is 100% stenosed. Mid LAD lesion is 100% stenosed. Prox RCA lesion is 100% stenosed. LIMA graft was visualized by non-selective angiography and is normal in caliber. The graft exhibits no disease. RIMA graft was visualized by non-selective angiography and is normal in caliber. The graft exhibits no disease.   1.  Severe underlying three-vessel coronary artery disease with patent grafts including LIMA to LAD, SVG Y graft to second diagonal  and OM 3 and RIMA to RCA. 2.  Left ventricular angiography was not performed.  EF was severely reduced by echo. 3.  Right heart catheterization showed normal filling pressures, minimal pulmonary hypertension and severely reduced cardiac output.   Recommendations: Continue medical therapy for chronic systolic heart failure. No need for any revascularization. Continue aggressive treatment of risk factors. Xarelto can be resumed tomorrow. __________  2D echo 05/05/2020: 1. Left ventricular ejection fraction, by estimation, is 25 to 30%. The  left ventricle has severely decreased function. The left ventricle  demonstrates global hypokinesis. There is mild left ventricular  hypertrophy. Left ventricular diastolic parameters   are consistent with Grade I diastolic dysfunction (impaired relaxation).   2. Right ventricular systolic function is normal. The right ventricular  size is normal.   3. The mitral valve is degenerative. No evidence of mitral valve  regurgitation.   4. The aortic valve has been repaired/replaced. Aortic valve  regurgitation is not visualized. There is a a bioprosthetic valve present  in the aortic position. Echo findings are consistent with normal structure  and function of the aortic valve  prosthesis.   5. Aortic dilatation  noted. There is borderline dilatation of the aortic  root measuring 38 mm.   Comparison(s): 01/26/19 TTE reported a 40% LVEF. __________  [facility] 01/27/2019: Abnormal, potentially high risk pharmacologic myocardial perfusion stress test. There is a large in size, severe, fixed inferior and inferolateral defect extending to the apex consistent with scar. No significant ischemia is identified, though sensititivity is reduced by the extent of prior infarct. The left ventricular ejection fraction is moderately to severely reduced (LVEF 20% by QGS and 37% by Siemens calculation). LVEF noted to be 40-45% on yesterday's echocardiogram. Attenuation  correction CT is notable for post-CABG and AVR findings. The gallbladder is surgically absent. __________  2D echo 01/26/2019: 1. The left ventricle has mild-moderately reduced systolic function, with  an ejection fraction of 40-45%. The cavity size was mildly dilated. Left  ventricular diastolic Doppler parameters are consistent with impaired  relaxation. The study is not  adequate for wall motion abnormalities.   2. The right ventricle has normal systolic function. The cavity was  normal. There is no increase in right ventricular wall thickness.   3. Left atrial size was mildly dilated.   4. The mitral valve is degenerative. Moderate calcification of the mitral  valve leaflet. There is severe mitral annular calcification present.   5. The tricuspid valve is grossly normal.   6. The aortic valve is tricuspid. Moderate calcification of the aortic  valve. Aortic valve regurgitation was not assessed by color flow Doppler  mild-moderate stenosis of the aortic valve. Mean gradient of 13 mm Hg.   7. No pulmonic valve vegetation visualized.  Laboratory Data:  Chemistry Recent Labs  Lab 11/08/21 1408 11/09/21 0701  NA 139 142  K 3.8 3.4*  CL 111 110  CO2 22 21*  GLUCOSE 148* 151*  BUN 17 18  CREATININE 1.29* 1.49*  CALCIUM 8.6* 8.6*  GFRNONAA 57* 48*  ANIONGAP 6 11    No results for input(s): PROT, ALBUMIN, AST, ALT, ALKPHOS, BILITOT in the last 168 hours. Hematology Recent Labs  Lab 11/08/21 1408  WBC 8.3  RBC 4.37  HGB 9.8*  HCT 32.6*  MCV 74.6*  MCH 22.4*  MCHC 30.1  RDW 18.8*  PLT 220   Cardiac EnzymesNo results for input(s): TROPONINI in the last 168 hours. No results for input(s): TROPIPOC in the last 168 hours.  BNP Recent Labs  Lab 11/08/21 1529  BNP 2,623.7*    DDimer No results for input(s): DDIMER in the last 168 hours.  Radiology/Studies:  DG Chest 2 View  Result Date: 11/08/2021 IMPRESSION: 1. Patchy opacities in the right base and small right  larger than left pleural effusions suspicious for pneumonia in the correct clinical setting. Recommend follow-up radiographs in 6-8 weeks to assess for resolution. 2. Unchanged cardiomegaly. Electronically Signed   By: Valetta Mole M.D.   On: 11/08/2021 14:36    Assessment and Plan:   1.  Acute on chronic HFrEF secondary to ICM: -He continues to appear volume overloaded -With diuresis, he has noted symptomatic improvement -Continue IV Lasix -Carvedilol -Entresto -Resume PTA Farxiga at discharge -Not currently on spironolactone secondary to underlying CKD -Escalate GDMT as able -Strict I's and O -Daily weight -CHF education  2.  CAD status post CABG with elevated troponin -Chest pain has resolved and dyspnea is improving with IV diuresis -Mildly elevated high-sensitivity troponin is relatively flat trending and likely indicative of supply demand ischemia in the context of volume overload, underlying native vessel CAD, anemia, and underlying renal dysfunction -  Nitro drip has been discontinued -It is reasonable to continue heparin drip for a total of 48 hours followed by resumption of PTA OAC thereafter, if no invasive testing is planned -Continue ASA, atorvastatin, carvedilol, and Imdur -With noted improvement in symptoms following diuresis, we will defer invasive ischemic testing at this time -Should symptoms return we can revisit this moving forward  3.  PAF: -Maintaining sinus rhythm -Carvedilol as outlined above -CHA2DS2-VASc at least 6 (CHF, HTN, age x2, DM, vascular disease) -Heparin drip for now -Resume PTA Xarelto prior to discharge    For questions or updates, please contact [place] Please consult [email or web address] for contact info under Cardiology/STEMI.   Signed, Christell Faith, PA-C [place] Pager: [phone] 11/09/2021, 11:23 AM

## 2021-11-09 NOTE — Consult Note (Signed)
Shawn Jennings for Heparin Indication: chest pain/ACS  No Known Allergies  Patient Measurements: Height: 6' (182.9 cm) Weight: 63.8 kg (140 lb 10.5 oz) IBW/kg (Calculated) : 77.6 Heparin Dosing Weight: 65 kg  Vital Signs: Temp: 97.7 F (36.5 C) (01/19 0134) Temp Source: Oral (01/18 2300) BP: 115/75 (01/19 0700) Pulse Rate: 65 (01/19 0700)  Labs: Recent Labs    11/08/21 1408 11/08/21 1539 11/09/21 0035 11/09/21 0701  HGB 9.8*  --   --   --   HCT 32.6*  --   --   --   PLT 220  --   --   --   APTT  --  34 70* 58*  LABPROT  --  15.6*  --   --   INR  --  1.2  --   --   HEPARINUNFRC  --   --   --  >1.10*  CREATININE 1.29*  --   --  1.49*  TROPONINIHS 254*  --  283*  --      Estimated Creatinine Clearance: 37.5 mL/min (A) (by C-G formula based on SCr of 1.49 mg/dL (H)).   Medical History: Past Medical History:  Diagnosis Date   Aortic stenosis    a. Pt unaware of history but CT imaging 01/2019 consistent w/ AVR; b. 01/2019 Echo: Mild to mod AS. Mean grad 73mmHg.   CAD (coronary artery disease)    a. 1994 s/p CABG ([place]); b. Reports multiple stress tests over the years w/o repeat cath; c. 01/2019 MV: large, sev, fixed inf and inflat defect extending to apex. No ischemia. EF 37%-->Med rx given atypical Ss and pt wishes.   CHF (congestive heart failure) (HCC)    CKD (chronic kidney disease), stage III (HCC)    COPD (chronic obstructive pulmonary disease) (HCC)    Depression    Essential hypertension    GERD (gastroesophageal reflux disease)    Hyperlipidemia LDL goal <70    Ischemic cardiomyopathy    a. 01/2019 Echo: EF 40-45%, impaired relaxation. Mildly dil LA. Sev mitral annular Ca2+. Mild to mod AS.   PAD (peripheral artery disease) ([place])    a. 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   PAF (paroxysmal atrial fibrillation) (HCC)    a. CHA2DS2VASc = 4-->Xarelto 15mg  daily in setting of CKD.   Thyrotoxicosis    Tobacco abuse    Type II diabetes  mellitus (HCC)     Medications:  Apixaban 5mg  BID for Atrial Fibrillation  Assessment: Pharmacy has been consulted to initiate Heparin in 76yo patient presenting to the ED with chest pain and shortness of breath that has been going on for roughly 24 hours. Patient has history of atrial fibrillation and is on apixaban 5mg  twice daily, with his last dose being taken on 1/17@~2000. Will use aPTT monitoring.   1/19 0701 aPTT 58 HL > 1.1  heparin bolus of 1000 units and increase heparin infusion to 900 units/hr  Goal of Therapy:  Heparin level 0.3-0.7 units/ml once aPTT and heparin level correlate.  aPTT 66-102 seconds Monitor platelets by anticoagulation protocol: Yes   Plan:  aPTT is subtherapeutic. Will give a heparin bolus of 1000 units and increase heparin infusion to 900 units/hr. Recheck aPTT in 8 hours. CBC and heparin level daily while on heparin.  Switch to heparin level monitoring once aPTT and heparin level correlate.   Oswald Hillock, PharmD,  11/09/2021,8:04 AM

## 2021-11-10 DIAGNOSIS — N1831 Chronic kidney disease, stage 3a: Secondary | ICD-10-CM

## 2021-11-10 DIAGNOSIS — D508 Other iron deficiency anemias: Secondary | ICD-10-CM

## 2021-11-10 DIAGNOSIS — I209 Angina pectoris, unspecified: Secondary | ICD-10-CM

## 2021-11-10 LAB — BASIC METABOLIC PANEL
Anion gap: 10 (ref 5–15)
BUN: 27 mg/dL — ABNORMAL HIGH (ref 8–23)
CO2: 21 mmol/L — ABNORMAL LOW (ref 22–32)
Calcium: 8.8 mg/dL — ABNORMAL LOW (ref 8.9–10.3)
Chloride: 109 mmol/L (ref 98–111)
Creatinine, Ser: 1.5 mg/dL — ABNORMAL HIGH (ref 0.61–1.24)
GFR, Estimated: 48 mL/min — ABNORMAL LOW (ref >60)
Glucose, Bld: 145 mg/dL — ABNORMAL HIGH (ref 70–99)
Potassium: 3.8 mmol/L (ref 3.5–5.1)
Sodium: 140 mmol/L (ref 135–145)

## 2021-11-10 LAB — IRON AND TIBC
Iron: 20 ug/dL — ABNORMAL LOW (ref 45–182)
Saturation Ratios: 6 % — ABNORMAL LOW (ref 17.9–39.5)
TIBC: 365 ug/dL (ref 250–450)
UIBC: 345 ug/dL

## 2021-11-10 LAB — CBC
HCT: 31.9 % — ABNORMAL LOW (ref 39.0–52.0)
Hemoglobin: 9.8 g/dL — ABNORMAL LOW (ref 13.0–17.0)
MCH: 22.3 pg — ABNORMAL LOW (ref 26.0–34.0)
MCHC: 30.7 g/dL (ref 30.0–36.0)
MCV: 72.5 fL — ABNORMAL LOW (ref 80.0–100.0)
Platelets: 247 10*3/uL (ref 150–400)
RBC: 4.4 MIL/uL (ref 4.22–5.81)
RDW: 18.4 % — ABNORMAL HIGH (ref 11.5–15.5)
WBC: 9 10*3/uL (ref 4.0–10.5)
nRBC: 0 % (ref 0.0–0.2)

## 2021-11-10 LAB — HEPARIN LEVEL (UNFRACTIONATED): Heparin Unfractionated: >1.1 IU/mL — ABNORMAL HIGH (ref 0.30–0.70)

## 2021-11-10 LAB — APTT: aPTT: 88 seconds — ABNORMAL HIGH (ref 24–36)

## 2021-11-10 LAB — GLUCOSE, CAPILLARY: Glucose-Capillary: 161 mg/dL — ABNORMAL HIGH (ref 70–99)

## 2021-11-10 LAB — FERRITIN: Ferritin: 37 ng/mL (ref 24–336)

## 2021-11-10 LAB — MAGNESIUM: Magnesium: 2.2 mg/dL (ref 1.7–2.4)

## 2021-11-10 MED ORDER — FUROSEMIDE 20 MG PO TABS: 20.0000 mg | ORAL_TABLET | Freq: Every day | ORAL | 0 refills | Status: DC | PRN

## 2021-11-10 MED ORDER — FUROSEMIDE 40 MG PO TABS
40.0000 mg | ORAL_TABLET | Freq: Every day | ORAL | Status: DC
  Administered 2021-11-10: 40 mg via ORAL
  Filled 2021-11-10: qty 1

## 2021-11-10 MED ORDER — FUROSEMIDE 40 MG PO TABS: 40.0000 mg | ORAL_TABLET | Freq: Every day | ORAL | 0 refills | Status: DC

## 2021-11-10 MED ORDER — FUROSEMIDE 40 MG PO TABS: 40.0000 mg | ORAL_TABLET | Freq: Every day | ORAL | 0 refills | Status: DC | PRN

## 2021-11-10 MED ORDER — FUROSEMIDE 20 MG PO TABS
20.0000 mg | ORAL_TABLET | Freq: Every day | ORAL | Status: DC
Start: 2021-11-11 — End: 2021-11-10

## 2021-11-10 MED ORDER — FUROSEMIDE 20 MG PO TABS: 20.0000 mg | ORAL_TABLET | Freq: Every day | ORAL | Status: DC

## 2021-11-10 MED ORDER — FUROSEMIDE 20 MG PO TABS: 20.0000 mg | ORAL_TABLET | Freq: Every day | ORAL | 0 refills | Status: AC

## 2021-11-10 MED ORDER — APIXABAN 5 MG PO TABS
5.0000 mg | ORAL_TABLET | Freq: Two times a day (BID) | ORAL | Status: DC
  Administered 2021-11-10: 5 mg via ORAL
  Filled 2021-11-10: qty 1

## 2021-11-10 MED ORDER — SODIUM CHLORIDE 0.9 % IV SOLN
200.0000 mg | Freq: Once | INTRAVENOUS | Status: AC
  Administered 2021-11-10: 200 mg via INTRAVENOUS
  Filled 2021-11-10: qty 200

## 2021-11-10 NOTE — Care Management Important Message (Signed)
Important Message  Patient Details  Name: Shawn Jennings MRN: [id number] Date of Birth: 01-Nov-1945   Medicare Important Message Given:  N/A - LOS <3 / Initial given by admissions     Shawn Jennings 11/10/2021, 1:57 PM

## 2021-11-10 NOTE — Consult Note (Signed)
[place] for Heparin Indication: chest pain/ACS  No Known Allergies  Patient Measurements: Height: 6' (182.9 cm) Weight: 60.7 kg (133 lb 13.1 oz) IBW/kg (Calculated) : 77.6 Heparin Dosing Weight: 65 kg  Vital Signs: Temp: 98 F (36.7 C) (01/19 2300) Temp Source: Oral (01/19 2300) BP: 101/63 (01/20 0000) Pulse Rate: 66 (01/20 0000)  Labs: Recent Labs    11/08/21 1408 11/08/21 1539 11/08/21 1539 11/09/21 0035 11/09/21 0701 11/09/21 1701 11/10/21 0552  HGB 9.8*  --   --   --   --   --  9.8*  HCT 32.6*  --   --   --   --   --  31.9*  PLT 220  --   --   --   --   --  247  APTT  --  34   < > 70* 58* 57* 88*  LABPROT  --  15.6*  --   --   --   --   --   INR  --  1.2  --   --   --   --   --   HEPARINUNFRC  --   --   --   --  >1.10*  --  >1.10*  CREATININE 1.29*  --   --   --  1.49*  --   --   TROPONINIHS 254*  --   --  283*  --   --   --    < > = values in this interval not displayed.     Estimated Creatinine Clearance: 35.6 mL/min (A) (by C-G formula based on SCr of 1.49 mg/dL (H)).   Medical History: Past Medical History:  Diagnosis Date   Aortic stenosis    a. Pt unaware of history but CT imaging 01/2019 consistent w/ AVR; b. 01/2019 Echo: Mild to mod AS. Mean grad 19mmHg.   CAD (coronary artery disease)    a. 1994 s/p CABG ([place]); b. Reports multiple stress tests over the years w/o repeat cath; c. 01/2019 MV: large, sev, fixed inf and inflat defect extending to apex. No ischemia. EF 37%-->Med rx given atypical Ss and pt wishes.   CHF (congestive heart failure) (HCC)    CKD (chronic kidney disease), stage III (HCC)    COPD (chronic obstructive pulmonary disease) (HCC)    Depression    Essential hypertension    GERD (gastroesophageal reflux disease)    Hyperlipidemia LDL goal <70    Ischemic cardiomyopathy    a. 01/2019 Echo: EF 40-45%, impaired relaxation. Mildly dil LA. Sev mitral annular Ca2+. Mild to mod AS.   PAD  (peripheral artery disease) ([place])    a. 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   PAF (paroxysmal atrial fibrillation) (HCC)    a. CHA2DS2VASc = 4-->Xarelto 15mg  daily in setting of CKD.   Thyrotoxicosis    Tobacco abuse    Type II diabetes mellitus (HCC)     Medications:  Apixaban 5mg  BID for Atrial Fibrillation  Assessment: Pharmacy has been consulted to initiate Heparin in 75yo patient presenting to the ED with chest pain and shortness of breath that has been going on for roughly 24 hours. Patient has history of atrial fibrillation and is on apixaban 5mg  twice daily, with his last dose being taken on 1/17@~2000. Will use aPTT monitoring.   Goal of Therapy:  Heparin level 0.3-0.7 units/ml once aPTT and heparin level correlate.  aPTT 66-102 seconds Monitor platelets by anticoagulation protocol: Yes   Plan:  1/20 @ 0552:  aPTT = 88,  HL = >1.10 aPTT therapeutic X 1 but HL still elevated Will continue pt on current rate and draw confirmation aPTT in 8 hrs on 1/20 @ 1400. Will recheck HL on 1/21 with AM labs. Will continue to use aPTT to guide dosing until aPTT and HL coincide.  Emrys Mceachron D, PharmD 11/10/2021,6:53 AM

## 2021-11-10 NOTE — TOC Transition Note (Signed)
Transition of Care [facility]) - CM/SW Discharge Note   Patient Details  Name: Shawn Jennings MRN: [id number] Date of Birth: 01-19-1946  Transition of Care [facility]) CM/SW Contact:  Eileen Stanford, LCSW Phone Number: 11/10/2021, 2:05 PM   Clinical Narrative:   Clinical Social Worker facilitated patient discharge including contacting patient family and facility to confirm patient discharge plans.  Clinical information faxed to facility and family agreeable with plan.  CSW arranged ambulance transport via [facility] to  [facility] (ALF).      Final next level of care: Assisted Living Barriers to Discharge: No Barriers Identified   Patient Goals and CMS Choice Patient states their goals for this hospitalization and ongoing recovery are:: to get better   Choice offered to / list presented to : Patient  Discharge Placement                Patient to be transferred to facility by: [facility]   Patient and family notified of of transfer: 11/10/21  Discharge Plan and Services     Post Acute Care Choice: [place]: RN, PT [facility] Agency: [place] ([place]) Date [facility] Agency Contacted: 11/10/21 Time [place]: [place] Representative spoke with at [place]: [place] ([place]) Interventions     Readmission Risk Interventions No flowsheet data found.

## 2021-11-10 NOTE — Discharge Summary (Addendum)
Physician Discharge Summary  Shawn Jennings EZM:[id number] DOB: [birth year] DOA: 11/08/2021  PCP: [facility]  Admit date: 11/08/2021 Discharge date: 11/10/2021  Discharge disposition: Assisted living facility   Recommendations for Outpatient Follow-Up:   Follow-up with PCP in 1 week Follow-up with cardiologist as scheduled (cardiology office will call to schedule appointment)   Discharge Diagnosis:   Principal Problem:   Acute on chronic systolic CHF (congestive heart failure) ([place]) Active Problems:   Chronic kidney disease, stage 3a ([place])   Ischemic chest pain ([place])   AF (paroxysmal atrial fibrillation) ([place])   NSTEMI (non-ST elevated myocardial infarction) ([place])   Anemia    Discharge Condition: Stable.  Diet recommendation:  Diet Order             Diet - low sodium heart healthy           Diet heart healthy/carb modified Room service appropriate? Yes; Fluid consistency: Thin  Diet effective now                     Code Status: Full Code     Hospital Course:   [name] is a 76 y.o. male with medical history significant for type II DM, hypertension, CAD s/p CABG, chronic systolic CHF (ischemic cardiomyopathy EF 20 to 25%), COPD, CKD stage III, aortic stenosis, paroxysmal atrial fibrillation on Eliquis, history of GI bleed, history of COVID-19 infection, PVD, thyrotoxicosis, bipolar disorder, tobacco use disorder.  He presented to the hospital because of chest pain associated with shortness of breath.  BNP was elevated at 2,623 and troponins were elevated as well (254, 283).     He was admitted to the hospital for probable NSTEMI and acute on chronic systolic and diastolic CHF.  He was treated with IV heparin and IV Lasix.  Cardiologist was consulted.  NSTEMI was ruled out and chest pain was attributed to chronic stable angina.  He was given IV iron infusion for iron deficiency anemia.  His condition has improved and is deemed stable for  discharge to a systolic facility today.        Medical Consultants:   Cardiologist   Discharge Exam:    Vitals:   11/09/21 2300 11/10/21 0000 11/10/21 0500 11/10/21 0727  BP:  101/63    Pulse:  66  68  Resp:  (!) 24    Temp: 98 F (36.7 C)   (!) 97.5 F (36.4 C)  TempSrc: Oral   Oral  SpO2:  97%  96%  Weight:   60.7 kg   Height:         GEN: NAD SKIN: Warm and dry EYES: EOMI ENT: MMM CV: RRR PULM: CTA B ABD: soft, ND, NT, +BS CNS: AAO x 3, non focal EXT: No edema or tenderness   The results of significant diagnostics from this hospitalization (including imaging, microbiology, ancillary and laboratory) are listed below for reference.     Procedures and Diagnostic Studies:   DG Chest 2 View  Result Date: 11/08/2021 CLINICAL DATA:  Chest pain EXAM: CHEST - 2 VIEW COMPARISON:  Chest radiograph 08/03/2021 FINDINGS: Median sternotomy wires, aortic valve prosthesis, and mediastinal surgical clips are again noted, with multiple fractured sternotomy wires. The heart is enlarged, unchanged. The upper mediastinal contours are stable. There are patchy opacities in the right lower lobe with small right larger than left pleural effusions. There is no pneumothorax. There is no acute osseous abnormality. A remote fracture of the left clavicle is again seen.  IMPRESSION: 1. Patchy opacities in the right base and small right larger than left pleural effusions suspicious for pneumonia in the correct clinical setting. Recommend follow-up radiographs in 6-8 weeks to assess for resolution. 2. Unchanged cardiomegaly. Electronically Signed   By: Valetta Mole M.D.   On: 11/08/2021 14:36     Labs:   Basic Metabolic Panel: Recent Labs  Lab 11/08/21 1408 11/09/21 0701 11/10/21 0552  NA 139 142 140  K 3.8 3.4* 3.8  CL 111 110 109  CO2 22 21* 21*  GLUCOSE 148* 151* 145*  BUN 17 18 27*  CREATININE 1.29* 1.49* 1.50*  CALCIUM 8.6* 8.6* 8.8*  MG  --  2.1 2.2   GFR Estimated  Creatinine Clearance: 35.4 mL/min (A) (by C-G formula based on SCr of 1.5 mg/dL (H)). Liver Function Tests: No results for input(s): AST, ALT, ALKPHOS, BILITOT, PROT, ALBUMIN in the last 168 hours. No results for input(s): LIPASE, AMYLASE in the last 168 hours. No results for input(s): AMMONIA in the last 168 hours. Coagulation profile Recent Labs  Lab 11/08/21 1539  INR 1.2    CBC: Recent Labs  Lab 11/08/21 1408 11/10/21 0552  WBC 8.3 9.0  HGB 9.8* 9.8*  HCT 32.6* 31.9*  MCV 74.6* 72.5*  PLT 220 247   Cardiac Enzymes: No results for input(s): CKTOTAL, CKMB, CKMBINDEX, TROPONINI in the last 168 hours. BNP: Invalid input(s): POCBNP CBG: Recent Labs  Lab 11/08/21 2355 11/09/21 0724 11/09/21 1119 11/09/21 1709 11/10/21 1206  GLUCAP 140* 148* 218* 133* 161*   D-Dimer No results for input(s): DDIMER in the last 72 hours. Hgb A1c No results for input(s): HGBA1C in the last 72 hours. Lipid Profile No results for input(s): CHOL, HDL, LDLCALC, TRIG, CHOLHDL, LDLDIRECT in the last 72 hours. Thyroid function studies No results for input(s): TSH, T4TOTAL, T3FREE, THYROIDAB in the last 72 hours.  Invalid input(s): FREET3 Anemia work up Recent Labs    11/10/21 0552  FERRITIN 37  TIBC 365  IRON 20*   Microbiology Recent Results (from the past 240 hour(s))  Blood culture (routine x 2)     Status: None (Preliminary result)   Collection Time: 11/08/21  3:29 PM   Specimen: BLOOD  Result Value Ref Range Status   Specimen Description BLOOD BLOOD LEFT FOREARM  Final   Special Requests   Final    BOTTLES DRAWN AEROBIC AND ANAEROBIC Blood Culture results may not be optimal due to an excessive volume of blood received in culture bottles   Culture   Final    NO GROWTH 2 DAYS Performed at [facility], [address], [place], [place] [address]    Report Status PENDING  Incomplete  Blood culture (routine x 2)     Status: None (Preliminary result)   Collection  Time: 11/08/21  3:29 PM   Specimen: BLOOD  Result Value Ref Range Status   Specimen Description BLOOD BLOOD LEFT HAND  Final   Special Requests   Final    BOTTLES DRAWN AEROBIC AND ANAEROBIC Blood Culture adequate volume   Culture   Final    NO GROWTH 2 DAYS Performed at [facility], [address] [place], [place] [address]    Report Status PENDING  Incomplete  Resp Panel by RT-PCR (Flu A&B, Covid) Nasopharyngeal Swab     Status: None   Collection Time: 11/08/21  3:29 PM   Specimen: Nasopharyngeal Swab; Nasopharyngeal(NP) swabs in vial transport medium  Result Value Ref Range Status  SARS Coronavirus 2 by RT PCR NEGATIVE NEGATIVE Final    Comment: (NOTE) SARS-CoV-2 target nucleic acids are NOT DETECTED.  The SARS-CoV-2 RNA is generally detectable in upper respiratory specimens during the acute phase of infection. The lowest concentration of SARS-CoV-2 viral copies this assay can detect is 138 copies/mL. A negative result does not preclude SARS-Cov-2 infection and should not be used as the sole basis for treatment or other patient management decisions. A negative result may occur with  improper specimen collection/handling, submission of specimen other than nasopharyngeal swab, presence of viral mutation(s) within the areas targeted by this assay, and inadequate number of viral copies(<138 copies/mL). A negative result must be combined with clinical observations, patient history, and epidemiological information. The expected result is Negative.  Fact Sheet for Patients:  EntrepreneurPulse.com.au  Fact Sheet for Healthcare Providers:  IncredibleEmployment.be  This test is no t yet approved or cleared by the [place] FDA and  has been authorized for detection and/or diagnosis of SARS-CoV-2 by FDA under an Emergency Use Authorization (EUA). This EUA will remain  in effect (meaning this test can be used) for the duration of  the COVID-19 declaration under Section 564(b)(1) of the Act, 21 U.S.C.section 360bbb-3(b)(1), unless the authorization is terminated  or revoked sooner.       Influenza A by PCR NEGATIVE NEGATIVE Final   Influenza B by PCR NEGATIVE NEGATIVE Final    Comment: (NOTE) The Xpert Xpress SARS-CoV-2/FLU/RSV plus assay is intended as an aid in the diagnosis of influenza from Nasopharyngeal swab specimens and should not be used as a sole basis for treatment. Nasal washings and aspirates are unacceptable for Xpert Xpress SARS-CoV-2/FLU/RSV testing.  Fact Sheet for Patients: EntrepreneurPulse.com.au  Fact Sheet for Healthcare Providers: IncredibleEmployment.be  This test is not yet approved or cleared by the [place] FDA and has been authorized for detection and/or diagnosis of SARS-CoV-2 by FDA under an Emergency Use Authorization (EUA). This EUA will remain in effect (meaning this test can be used) for the duration of the COVID-19 declaration under Section 564(b)(1) of the Act, 21 U.S.C. section 360bbb-3(b)(1), unless the authorization is terminated or revoked.  Performed at [facility], [address] [place], [place] [address]      Discharge Instructions:   Discharge Instructions     Diet - low sodium heart healthy   Complete by: As directed    Face-to-face encounter (required for Medicare/Medicaid patients)   Complete by: As directed    I [place] certify that this patient is under my care and that I, or a nurse practitioner or physician's assistant working with me, had a face-to-face encounter that meets the physician face-to-face encounter requirements with this patient on 11/10/2021. The encounter with the patient was in whole, or in part for the following medical condition(s) which is the primary reason for home health care (List medical condition): Debility, CHF   The encounter with the patient was in whole, or in  part, for the following medical condition, which is the primary reason for home health care: Debility, CHF   I certify that, based on my findings, the following services are medically necessary home health services:  Nursing Physical therapy     Reason for Medically Necessary Home Health Services:  Therapy- Personnel officer, Public librarian Skilled Nursing- Skilled Assessment/Observation     My clinical findings support the need for the above services: Unsafe ambulation due to balance issues   Further, I certify that my clinical  findings support that this patient is homebound due to: Shortness of Breath with activity   Home Health   Complete by: As directed    To provide the following care/treatments:  PT RN     Increase activity slowly   Complete by: As directed       Allergies as of 11/10/2021   No Known Allergies      Medication List     STOP taking these medications    acetaminophen 500 MG tablet Commonly known as: TYLENOL   senna 8.6 MG tablet Commonly known as: SENOKOT       TAKE these medications    apixaban 5 MG Tabs tablet Commonly known as: ELIQUIS Take 1 tablet (5 mg total) by mouth 2 (two) times daily.   aspirin EC 81 MG tablet Take 81 mg by mouth daily. Swallow whole.   atorvastatin 80 MG tablet Commonly known as: LIPITOR Take 80 mg by mouth at bedtime.   carvedilol 3.125 MG tablet Commonly known as: COREG Take 1 tablet (3.125 mg total) by mouth 2 (two) times daily.   cholecalciferol 25 MCG (1000 UNIT) tablet Commonly known as: VITAMIN D3 Take 1,000 Units by mouth every evening.   dapagliflozin propanediol 5 MG Tabs tablet Commonly known as: FARXIGA Take 5 mg by mouth daily.   DULoxetine 30 MG capsule Commonly known as: CYMBALTA Take 30 mg by mouth 2 (two) times daily.   Entresto 24-26 MG Generic drug: sacubitril-valsartan Take 1 tablet by mouth 2 (two) times daily.   ferrous sulfate 325 (65 FE) MG tablet Take 325  mg by mouth every other day.   furosemide 20 MG tablet Commonly known as: LASIX Take 1 tablet (20 mg total) by mouth daily as needed for fluid (shortness of breath). What changed: You were already taking a medication with the same name, and this prescription was added. Make sure you understand how and when to take each.   furosemide 20 MG tablet Commonly known as: LASIX Take 1 tablet (20 mg total) by mouth daily. What changed:  medication strength how much to take   hydrOXYzine 25 MG tablet Commonly known as: ATARAX Take 25 mg by mouth every 6 (six) hours as needed (allergy symptoms).   isosorbide mononitrate 30 MG 24 hr tablet Commonly known as: IMDUR Take 1 tablet (30 mg total) by mouth daily.   lamoTRIgine 25 MG tablet Commonly known as: LAMICTAL Take 25 mg by mouth 2 (two) times daily.   loratadine 10 MG tablet Commonly known as: CLARITIN Take 10 mg by mouth daily.   melatonin 3 MG Tabs tablet Take 1 tablet by mouth at bedtime.   montelukast 10 MG tablet Commonly known as: SINGULAIR Take 10 mg by mouth at bedtime.   nitroGLYCERIN 0.4 MG SL tablet Commonly known as: NITROSTAT Place 0.4 mg under the tongue every 5 (five) minutes as needed for chest pain.   pantoprazole 40 MG tablet Commonly known as: PROTONIX Take 40 mg by mouth daily.   potassium chloride 20 MEQ packet Commonly known as: KLOR-CON Take 20 mEq by mouth daily.   PreserVision AREDS Caps Take 1 capsule by mouth in the morning and at bedtime.   tamsulosin 0.4 MG Caps capsule Commonly known as: FLOMAX Take 0.4 mg by mouth daily.   Ventolin HFA 108 (90 Base) MCG/ACT inhaler Generic drug: albuterol Inhale 2 puffs into the lungs every 6 (six) hours as needed for wheezing or shortness of breath.   vitamin B-12 1000 MCG tablet Commonly  known as: CYANOCOBALAMIN Take 1,000 mcg by mouth daily.           If you experience worsening of your admission symptoms, develop shortness of breath, life  threatening emergency, suicidal or homicidal thoughts you must seek medical attention immediately by calling 911 or calling your MD immediately  if symptoms less severe.   You must read complete instructions/literature along with all the possible adverse reactions/side effects for all the medicines you take and that have been prescribed to you. Take any new medicines after you have completely understood and accept all the possible adverse reactions/side effects.    Please note   You were cared for by a hospitalist during your hospital stay. If you have any questions about your discharge medications or the care you received while you were in the hospital after you are discharged, you can call the unit and asked to speak with the hospitalist on call if the hospitalist that took care of you is not available. Once you are discharged, your primary care physician will handle any further medical issues. Please note that NO REFILLS for any discharge medications will be authorized once you are discharged, as it is imperative that you return to your primary care physician (or establish a relationship with a primary care physician if you do not have one) for your aftercare needs so that they can reassess your need for medications and monitor your lab values.       Time coordinating discharge: 33 minutes  Signed:  Makaylie Dedeaux  [facility] 11/10/2021, 2:09 PM   Pager on [email or web address]. If 7PM-7AM, please contact night-coverage at [email or web address]

## 2021-11-10 NOTE — Progress Notes (Signed)
PT Cancellation Note  Patient Details Name: Shawn Jennings MRN: [id number] DOB: October 10, 1946   Cancelled Treatment:    Reason Eval/Treat Not Completed: Patient declined, no reason specified. Orders received and chart reviewed. Upon entry to room pt awaiting D/c. Declining need for PT eval stating he remains mod-I with transfers. PT to sign off.   [place] Caster. Fairly IV, PT, DPT Physical Therapist- [facility]  11/10/2021, 2:30 PM

## 2021-11-10 NOTE — Plan of Care (Signed)
°  Problem: Education: Goal: Knowledge of General Education information will improve Description: Including pain rating scale, medication(s)/side effects and non-pharmacologic comfort measures Outcome: Progressing   Problem: Health Behavior/Discharge Planning: Goal: Ability to manage health-related needs will improve Outcome: Progressing   Problem: Clinical Measurements: Goal: Respiratory complications will improve Outcome: Progressing   Problem: Nutrition: Goal: Adequate nutrition will be maintained Outcome: Progressing   Problem: Education: Goal: Understanding of medication regimen will improve Outcome: Progressing   Problem: Clinical Measurements: Goal: Ability to maintain clinical measurements within normal limits will improve Outcome: Not Progressing   Problem: Activity: Goal: Risk for activity intolerance will decrease Outcome: Not Progressing   Problem: Education: Goal: Understanding of cardiac disease, CV risk reduction, and recovery process will improve Outcome: Not Progressing   Problem: Activity: Goal: Ability to tolerate increased activity will improve Outcome: Not Progressing   Problem: Cardiac: Goal: Ability to achieve and maintain adequate cardiopulmonary perfusion will improve Outcome: Not Progressing

## 2021-11-10 NOTE — Progress Notes (Signed)
Progress Note  Patient Name: Shawn Jennings Date of Encounter: 11/10/2021  Primary Cardiologist: [place] much better. No chest pain or dyspnea. Feels back to baseline, feels ready to go home. Documented UOP 1.6 L over the past 24 hours with a net - 2.4 L for the admission. Slight up trend in BUN with stable SCr. HGB low, though stable.   Inpatient Medications    Scheduled Meds:  aspirin EC  81 mg Oral Daily   atorvastatin  80 mg Oral QHS   carvedilol  3.125 mg Oral BID   cholecalciferol  1,000 Units Oral QPM   DULoxetine  30 mg Oral BID   feeding supplement (GLUCERNA SHAKE)  237 mL Oral BID BM   insulin aspart  0-9 Units Subcutaneous TID WC   isosorbide mononitrate  30 mg Oral Daily   lamoTRIgine  25 mg Oral BID   loratadine  10 mg Oral Daily   melatonin  5 mg Oral QHS   montelukast  10 mg Oral QHS   multivitamin with minerals  1 tablet Oral Daily   pantoprazole  40 mg Oral Daily   Ensure Max Protein  11 oz Oral QHS   sacubitril-valsartan  1 tablet Oral BID   Continuous Infusions:  heparin 1,050 Units/hr (11/09/21 1845)   nitroGLYCERIN Stopped (11/09/21 0755)   PRN Meds: acetaminophen, albuterol, nitroGLYCERIN, ondansetron (ZOFRAN) IV   Vital Signs    Vitals:   11/09/21 2300 11/10/21 0000 11/10/21 0500 11/10/21 0727  BP:  101/63    Pulse:  66  68  Resp:  (!) 24    Temp: 98 F (36.7 C)   (!) 97.5 F (36.4 C)  TempSrc: Oral   Oral  SpO2:  97%  96%  Weight:   60.7 kg   Height:        Intake/Output Summary (Last 24 hours) at 11/10/2021 0830 Last data filed at 11/10/2021 0300 Gross per 24 hour  Intake 677.72 ml  Output 2300 ml  Net -1622.28 ml   Filed Weights   11/08/21 1505 11/09/21 0059 11/10/21 0500  Weight: 65 kg 63.8 kg 60.7 kg    Telemetry    SR with rare PVCs - Personally Reviewed  ECG    No new tracings - Personally Reviewed  Physical Exam   GEN: No acute distress.   Neck: No JVD. Cardiac: RRR, I/VI systolic murmur,  no rubs, or gallops.  Respiratory: Clear to auscultation bilaterally.  GI: Soft, nontender, non-distended.   MS: Status post left AKA; No edema; No deformity. Neuro:  Alert and oriented x 3; Nonfocal.  Psych: Normal affect.  Labs    Chemistry Recent Labs  Lab 11/08/21 1408 11/09/21 0701  NA 139 142  K 3.8 3.4*  CL 111 110  CO2 22 21*  GLUCOSE 148* 151*  BUN 17 18  CREATININE 1.29* 1.49*  CALCIUM 8.6* 8.6*  GFRNONAA 57* 48*  ANIONGAP 6 11     Hematology Recent Labs  Lab 11/08/21 1408 11/10/21 0552  WBC 8.3 9.0  RBC 4.37 4.40  HGB 9.8* 9.8*  HCT 32.6* 31.9*  MCV 74.6* 72.5*  MCH 22.4* 22.3*  MCHC 30.1 30.7  RDW 18.8* 18.4*  PLT 220 247    Cardiac EnzymesNo results for input(s): TROPONINI in the last 168 hours. No results for input(s): TROPIPOC in the last 168 hours.   BNP Recent Labs  Lab 11/08/21 1529  BNP 2,623.7*     DDimer No results for  input(s): DDIMER in the last 168 hours.   Radiology    DG Chest 2 View  Result Date: 11/08/2021 IMPRESSION: 1. Patchy opacities in the right base and small right larger than left pleural effusions suspicious for pneumonia in the correct clinical setting. Recommend follow-up radiographs in 6-8 weeks to assess for resolution. 2. Unchanged cardiomegaly. Electronically Signed   By: Valetta Mole M.D.   On: 11/08/2021 14:36    Cardiac Studies   2D echo 08/03/2021: 1. Left ventricular ejection fraction, by estimation, is 20 to 25%. The  left ventricle has severely decreased function. The left ventricle  demonstrates severe global hypokinesis, anterior wall motion best  preserved. The left ventricular internal cavity  size was moderately dilated.   2. Right ventricular systolic function is normal. The right ventricular  size is normal.   3. Left atrial size was moderately dilated.   4. The mitral valve is normal in structure. Mild mitral valve  regurgitation.   5. The aortic valve was not well  visualized. __________   2D echo 10/19/2020: 1. Left ventricular ejection fraction, by estimation, is 50 to 55%. The  left ventricle has low normal function. The left ventricle has no regional  wall motion abnormalities. The left ventricular internal cavity size was  mildly dilated. Left ventricular  diastolic parameters were normal.   2. Right ventricular systolic function is normal. The right ventricular  size is mildly enlarged.   3. Left atrial size was mildly dilated.   4. Right atrial size was mildly dilated.   5. The mitral valve was not well visualized. Trivial mitral valve  regurgitation.   6. The aortic valve was not well visualized. Aortic valve regurgitation  is trivial. Mild aortic valve stenosis. ___________   [facility] 06/06/2020: Prox Cx to Mid Cx lesion is 100% stenosed. Mid LAD lesion is 100% stenosed. Prox RCA lesion is 100% stenosed. LIMA graft was visualized by non-selective angiography and is normal in caliber. The graft exhibits no disease. RIMA graft was visualized by non-selective angiography and is normal in caliber. The graft exhibits no disease.   1.  Severe underlying three-vessel coronary artery disease with patent grafts including LIMA to LAD, SVG Y graft to second diagonal and OM 3 and RIMA to RCA. 2.  Left ventricular angiography was not performed.  EF was severely reduced by echo. 3.  Right heart catheterization showed normal filling pressures, minimal pulmonary hypertension and severely reduced cardiac output.   Recommendations: Continue medical therapy for chronic systolic heart failure. No need for any revascularization. Continue aggressive treatment of risk factors. Xarelto can be resumed tomorrow. __________   2D echo 05/05/2020: 1. Left ventricular ejection fraction, by estimation, is 25 to 30%. The  left ventricle has severely decreased function. The left ventricle  demonstrates global hypokinesis. There is mild left ventricular   hypertrophy. Left ventricular diastolic parameters   are consistent with Grade I diastolic dysfunction (impaired relaxation).   2. Right ventricular systolic function is normal. The right ventricular  size is normal.   3. The mitral valve is degenerative. No evidence of mitral valve  regurgitation.   4. The aortic valve has been repaired/replaced. Aortic valve  regurgitation is not visualized. There is a a bioprosthetic valve present  in the aortic position. Echo findings are consistent with normal structure  and function of the aortic valve  prosthesis.   5. Aortic dilatation noted. There is borderline dilatation of the aortic  root measuring 38 mm.   Comparison(s): 01/26/19 TTE  reported a 40% LVEF. __________   [facility] 01/27/2019: Abnormal, potentially high risk pharmacologic myocardial perfusion stress test. There is a large in size, severe, fixed inferior and inferolateral defect extending to the apex consistent with scar. No significant ischemia is identified, though sensititivity is reduced by the extent of prior infarct. The left ventricular ejection fraction is moderately to severely reduced (LVEF 20% by QGS and 37% by Siemens calculation). LVEF noted to be 40-45% on yesterday's echocardiogram. Attenuation correction CT is notable for post-CABG and AVR findings. The gallbladder is surgically absent. __________   2D echo 01/26/2019: 1. The left ventricle has mild-moderately reduced systolic function, with  an ejection fraction of 40-45%. The cavity size was mildly dilated. Left  ventricular diastolic Doppler parameters are consistent with impaired  relaxation. The study is not  adequate for wall motion abnormalities.   2. The right ventricle has normal systolic function. The cavity was  normal. There is no increase in right ventricular wall thickness.   3. Left atrial size was mildly dilated.   4. The mitral valve is degenerative. Moderate calcification of the mitral  valve  leaflet. There is severe mitral annular calcification present.   5. The tricuspid valve is grossly normal.   6. The aortic valve is tricuspid. Moderate calcification of the aortic  valve. Aortic valve regurgitation was not assessed by color flow Doppler  mild-moderate stenosis of the aortic valve. Mean gradient of 13 mm Hg.   7. No pulmonic valve vegetation visualized.  Patient Profile  76 y.o. male with history of CAD status post CABG in 62 in [place], [place], chronic chest pain, HFrEF secondary to ICM, PAF, aortic stenosis status post AVR, PAD status post left AKA, CKD stage III, DM2, HTN, HLD, COPD, tobacco use, GERD, and depression who is being seen today for the evaluation of elevated troponin at the request of Dr. Mal Misty.  Assessment & Plan    1. Acute on chronic HFrEF secondary to ICM: -Much improved with diuresis -Transition to oral Lasix 40 mg daily with an additional dose as needed -Carvedilol -Entresto -Resume PTA Farxiga at discharge -Not currently on spironolactone secondary to underlying CKD -Escalate GDMT as able, relative hypotension precludes escalation at this time -Strict I's and O -Daily weight -CHF education   2.  CAD status post CABG with elevated troponin -Chest pain and dyspnea have resolved with IV diuresis -Mildly elevated high-sensitivity troponin is relatively flat trending and likely indicative of supply demand ischemia in the context of volume overload, underlying native vessel CAD, anemia, and underlying renal dysfunction -Nitro drip has been discontinued -He remains on heparin drip as outlined below -Continue ASA, atorvastatin, carvedilol, Imdur, and as needed SL NTG -With noted improvement in symptoms following diuresis, we will defer invasive ischemic testing at this time -Should symptoms return we can revisit this moving forward in the outpatient setting   3.  PAF: -Maintaining sinus rhythm -Carvedilol as outlined above -CHA2DS2-VASc at least 6  (CHF, HTN, age x2, DM, vascular disease) -Heparin drip for now -Resume PTA Xarelto prior to discharge       For questions or updates, please contact [place] Please consult [email or web address] for contact info under Cardiology/STEMI.    Signed, Christell Faith, PA-C [facility] Pager: [phone] 11/10/2021, 8:30 AM

## 2021-11-10 NOTE — TOC Initial Note (Signed)
Transition of Care [facility]) - Initial/Assessment Note    Patient Details  Name: Shawn Jennings MRN: [id number] Date of Birth: 01/09/1946  [facility]) CM/SW Contact:    Eileen Stanford, LCSW Phone Number: 11/10/2021, 12:18 PM  Clinical Narrative:   CSW spoke with pt and pt confirmed he is from [facility]. Pt states he had HH PT in the past but couldn't remember who it was with. CSW confirmed it was with [facility]. Pt is agreeable to use [facility] again. [facility] given to [facility] with [facility].              Expected Discharge Plan: [place] Barriers to Discharge: Continued Medical Work up   Patient Goals and CMS Choice Patient states their goals for this hospitalization and ongoing recovery are:: to get better   Choice offered to / list presented to : Patient  Expected Discharge Plan and Services Expected Discharge Plan: [place] Acute Care Choice: [place] arrangements for the past 2 months: Assisted Living Facility                           HH Arranged: PT, RN [facility] Agency: [place] ([place]) Date HH Agency Contacted: 11/10/21 Time [place]: 1217 Representative spoke with at [place]: Corene Cornea  Prior Living Arrangements/Services Living arrangements for the past 2 months: [place] Lives with:: Facility Resident Patient language and need for interpreter reviewed:: Yes Do you feel safe going back to the place where you live?: Yes      Need for Family Participation in Patient Care: Yes (Comment) Care giver support system in place?: Yes (comment)   Criminal Activity/Legal Involvement Pertinent to Current Situation/Hospitalization: No - Comment as needed  Activities of Daily Living Home Assistive Devices/Equipment: Prosthesis, Wheelchair ADL Screening (condition at time of admission) Patient's cognitive ability adequate to safely complete daily activities?: Yes Is the  patient deaf or have difficulty hearing?: No Does the patient have difficulty seeing, even when wearing glasses/contacts?: No Does the patient have difficulty concentrating, remembering, or making decisions?: No Patient able to express need for assistance with ADLs?: Yes Does the patient have difficulty dressing or bathing?: Yes Independently performs ADLs?: Yes (appropriate for developmental age) Does the patient have difficulty walking or climbing stairs?: Yes Weakness of Legs: Both Weakness of Arms/Hands: None  Permission Sought/Granted Permission sought to share information with : Family Supports Permission granted to share information with : Yes, Verbal Permission Granted  Share Information with NAME: stephanie     Permission granted to share info w Relationship: friend     Emotional Assessment Appearance:: Appears stated age Attitude/Demeanor/Rapport: Engaged Affect (typically observed): Accepting Orientation: : Oriented to Self, Oriented to Place, Oriented to  Time, Oriented to Situation Alcohol / Substance Use: Not Applicable Psych Involvement: No (comment)  Admission diagnosis:  Unstable angina (HCC) [I20.0] NSTEMI (non-ST elevated myocardial infarction) ([place]) [I21.4] Acute on chronic congestive heart failure, unspecified heart failure type ([place]) [I50.9] Patient Active Problem List   Diagnosis Date Noted   Anemia 11/09/2021   NSTEMI (non-ST elevated myocardial infarction) ([place]) 11/08/2021   Acute on chronic systolic CHF (congestive heart failure) ([place]) 11/08/2021   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[place]) 08/04/2021   Chronic obstructive pulmonary disease, unspecified ([place]) 08/04/2021   Hyperthyroidism 08/04/2021   CHF exacerbation ([place]) 28/31/5176   Acute diastolic CHF (congestive heart failure) (  [place]) 08/03/2021   COVID-19 virus infection 08/03/2021   Hypotension    Acute kidney injury superimposed on CKD ([place])    Acute blood loss anemia    Hypokalemia     Type 2 diabetes mellitus with stage 3a chronic kidney disease, without long-term current use of insulin (HCC)    Abdominal pain    GI bleed 07/15/2021   AF (paroxysmal atrial fibrillation) ([place]) 07/15/2021   On anticoagulant therapy 07/15/2021   Depression 07/15/2021   GERD (gastroesophageal reflux disease) 07/15/2021   Tobacco abuse 07/15/2021   Hyperlipidemia 07/15/2021   Upper GI bleed    Encounter for fitting and adjustment of other gastrointestinal appliance and device    Cholangitis    Protein-calorie malnutrition, severe 10/18/2020   Choledocholithiasis 10/17/2020   Coronary artery disease of native artery of native heart with stable angina pectoris ([place])    Ischemic cardiomyopathy    Other chest pain 03/10/2020   Chronic kidney disease, stage 3a ([place]) 03/09/2020   Essential hypertension 03/09/2020   Chest pain 01/27/2019   Renal insufficiency    Nonspecific chest pain 01/26/2019   PCP:  [facility]:   [place], [place] [address] [place] [address] Phone: [phone] Fax: [phone]     Social Determinants of Health (SDOH) Interventions    Readmission Risk Interventions No flowsheet data found.

## 2021-11-13 LAB — CULTURE, BLOOD (ROUTINE X 2)
Culture: NO GROWTH
Culture: NO GROWTH
Special Requests: ADEQUATE

## 2021-11-17 ENCOUNTER — Non-Acute Institutional Stay: Payer: Medicare Other | Admitting: Student

## 2021-11-17 ENCOUNTER — Other Ambulatory Visit: Payer: Self-pay

## 2021-11-17 DIAGNOSIS — Z515 Encounter for palliative care: Secondary | ICD-10-CM

## 2021-11-17 DIAGNOSIS — I5042 Chronic combined systolic (congestive) and diastolic (congestive) heart failure: Secondary | ICD-10-CM

## 2021-11-17 DIAGNOSIS — G479 Sleep disorder, unspecified: Secondary | ICD-10-CM

## 2021-11-17 DIAGNOSIS — Z720 Tobacco use: Secondary | ICD-10-CM

## 2021-11-17 DIAGNOSIS — R079 Chest pain, unspecified: Secondary | ICD-10-CM

## 2021-11-17 NOTE — Progress Notes (Signed)
[facility] Palliative Care Consult Note Telephone: [phone]  Fax: [phone]   Date of encounter: 11/17/21 11:41 AM PATIENT NAME: Shawn Jennings Shawn Jennings [id number]   [phone] (home)  DOB: April 12, 1946 MRN: [id number] PRIMARY CARE PROVIDER:    [facility],  [place] [address] [phone]  REFERRING PROVIDER:   [facility] [address] [place],  [place] [address] [phone]  RESPONSIBLE PARTY:    Contact Information     Name Relation Home Work Mobile   Smith,Stephanie Friend [phone]          I met face to face with patient in the home. Palliative Care was asked to follow this patient by consultation request of  [facility] to address advance care planning and complex medical decision making. This is the initial visit.                                     ASSESSMENT AND PLAN / RECOMMENDATIONS:   Advance Care Planning/Goals of Care: Goals include to maximize quality of life and symptom management. Patient/health care surrogate gave his/her permission to discuss.Our advance care planning conversation included a discussion about:    The value and importance of advance care planning  Experiences with loved ones who have been seriously ill or have died  Exploration of personal, cultural or spiritual beliefs that might influence medical decisions  Exploration of goals of care in the event of a sudden injury or illness  Identification  of a healthcare agent  Review and updating or creation of an  advance directive document . Decision not to resuscitate or to de-escalate disease focused treatments due to poor prognosis. CODE STATUS: Full Code  Education provided on Palliative Medicine services. Reviewed goals of care. MOST form completed today; he would like to remain a Full Code. Ongoing education on his HF, disease processes.  Patient expresses wanting to move back to [place], but this does not seem possible. He states most of his family has passed away. He had therapy ordered upon hospital discharge; he declines need for therapy at this time. Unclear if therapy had started at facility.  I spent 22 minutes providing this consultation. More than 50% of the time in this consultation was spent in counseling and care coordination.  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 Continue carvedilol 3.125 mg BID, entresto, furosemide 20 mg QD and QD PRN for shortness of breath, edema. Monitor for worsening shortness of breath and edema. Recommend daily weights at facility.   Chest pain-no recent episodes of chest pain; continue Imdur and nitroglycerin 0.4 mg every 5 minutes PRN chest pain.   Smoking cessation- patient smoking 6 cigarettes a day. Education provided on smoking cessation; declines intervention at this time.   Sleep difficulty-increase melatonin to 21m QHS routinely. Education on sleep hygiene.   Follow up Palliative Care Visit: Palliative care will continue to follow for complex medical decision making, advance care planning, and clarification of goals. Return in 6-8 weeks or prn.  This visit was coded based on medical decision making (MDM).  PPS: 50%  HOSPICE ELIGIBILITY/DIAGNOSIS: TBD  Chief Complaint: Palliative Medicine initial   HISTORY OF PRESENT ILLNESS:  DMiriam Kestleris a 76y.o. year old male  with acute on chronic CHF, ischemic chest pain, atrial fibrillation, CKD  3, type 2 DM. Hx of GI bleed.  Patient recently hospitalized 1/18-1/20/23 due to   Patient resides at [facility]. He reports doing well. Denies pain, chest pain, shortness of breath, denies weakness. 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 Has a prosthetic leg, but no longer using. Weekly blood sugar  checks, not on insulin. Transfers self. Able to complete most adl's; does require assistance with bathing. No falls. W/c for locomotion; interested in a motorized w/c. Propels self.  Endorses a good appetite; weight 141 pounds. Does report trouble staying asleep. Patient is smoking 6 cigarettes a day.   History obtained from review of EMR, discussion with primary team, and interview with family, facility staff/caregiver and/or [name].  I reviewed available labs, medications, imaging, studies and related documents from the EMR.  Records reviewed and summarized above.   ROS General: NAD EYES: denies vision changes ENMT: denies dysphagia Cardiovascular: denies chest pain, denies DOE Pulmonary: denies cough, denies increased SOB Abdomen: endorses good appetite, denies constipation, endorses continence of bowel GU: denies dysuria MSK:  denies increased weakness,  no falls reported Skin: denies rashes or wounds Neurological: denies pain, denies insomnia Psych: Endorses positive mood Heme/lymph/immuno: denies bruises, abnormal bleeding  Physical Exam: Pulse 64, resp 16, b/p 140/70, sats 96% on room. Constitutional: NAD General: frail appearing  EYES: anicteric sclera, lids intact, no discharge  ENMT: intact hearing, oral mucous membranes moist, dentition intact CV: S1S2, RRR, no LE edema Pulmonary: LCTA, no increased work of breathing, no cough, room air Abdomen: normo-active BS + 4 quadrants, soft and non tender, no ascites GU: deferred MSK: left AKA, non-ambulatory, w/c bound Skin: warm and dry, no rashes or wounds on visible skin Neuro:  no generalized weakness,  no cognitive impairment Psych: non-anxious affect, A and O x 3, pleasant Hem/lymph/immuno: no widespread bruising CURRENT PROBLEM LIST:  Patient Active Problem List   Diagnosis Date Noted   Anemia 11/09/2021   NSTEMI (non-ST elevated myocardial infarction) ([place]) 11/08/2021   Acute on chronic systolic CHF (congestive  heart failure) ([place]) 11/08/2021   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[place]) 08/04/2021   Chronic obstructive pulmonary disease, unspecified ([place]) 08/04/2021   Hyperthyroidism 08/04/2021   CHF exacerbation ([place]) 18/29/9371   Acute diastolic CHF (congestive heart failure) ([place]) 08/03/2021   COVID-19 virus infection 08/03/2021   Hypotension    Acute kidney injury superimposed on CKD (HCC)    Acute blood loss anemia    Hypokalemia    Type 2 diabetes mellitus with stage 3a chronic kidney disease, without long-term current use of insulin (HCC)    Abdominal pain    GI bleed 07/15/2021   AF (paroxysmal atrial fibrillation) ([place]) 07/15/2021   On anticoagulant therapy 07/15/2021   Depression 07/15/2021   GERD (gastroesophageal reflux disease) 07/15/2021   Tobacco abuse 07/15/2021   Hyperlipidemia 07/15/2021   Upper GI bleed    Encounter for fitting and adjustment of other gastrointestinal appliance and device    Cholangitis    Protein-calorie malnutrition, severe 10/18/2020   Choledocholithiasis 10/17/2020   Coronary artery disease of native artery of native heart with stable angina pectoris (HCC)    Ischemic cardiomyopathy    Ischemic chest pain ([place]) 03/10/2020   Chronic kidney disease, stage 3a ([place]) 03/09/2020   Essential hypertension 03/09/2020   Chest pain 01/27/2019   Renal insufficiency    Nonspecific chest pain 01/26/2019   PAST MEDICAL HISTORY:  Active Ambulatory Problems    Diagnosis Date  Noted   Nonspecific chest pain 01/26/2019   Renal insufficiency    Chest pain 01/27/2019   Chronic kidney disease, stage 3a ([place]) 03/09/2020   Essential hypertension 03/09/2020   Ischemic chest pain ([place]) 03/10/2020   Coronary artery disease of native artery of native heart with stable angina pectoris ([place])    Ischemic cardiomyopathy    Choledocholithiasis 10/17/2020   Protein-calorie malnutrition, severe 10/18/2020   Cholangitis    Encounter for fitting and adjustment of other  gastrointestinal appliance and device    GI bleed 07/15/2021   AF (paroxysmal atrial fibrillation) ([place]) 07/15/2021   On anticoagulant therapy 07/15/2021   Depression 07/15/2021   GERD (gastroesophageal reflux disease) 07/15/2021   Tobacco abuse 07/15/2021   Hyperlipidemia 07/15/2021   Upper GI bleed    Abdominal pain    Acute blood loss anemia    Hypokalemia    Type 2 diabetes mellitus with stage 3a chronic kidney disease, without long-term current use of insulin (HCC)    Hypotension    Acute kidney injury superimposed on CKD ([place])    CHF exacerbation ([place]) 95/28/4132   Acute diastolic CHF (congestive heart failure) ([place]) 08/03/2021   COVID-19 virus infection 08/03/2021   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[place]) 08/04/2021   Chronic obstructive pulmonary disease, unspecified ([place]) 08/04/2021   Hyperthyroidism 08/04/2021   NSTEMI (non-ST elevated myocardial infarction) ([place]) 11/08/2021   Acute on chronic systolic CHF (congestive heart failure) ([place]) 11/08/2021   Anemia 11/09/2021   Resolved Ambulatory Problems    Diagnosis Date Noted   No Resolved Ambulatory Problems   Past Medical History:  Diagnosis Date   Aortic stenosis    CAD (coronary artery disease)    CHF (congestive heart failure) (HCC)    CKD (chronic kidney disease), stage III (HCC)    COPD (chronic obstructive pulmonary disease) (HCC)    Hyperlipidemia LDL goal <70    PAD (peripheral artery disease) (HCC)    PAF (paroxysmal atrial fibrillation) ([place])    Thyrotoxicosis    Type II diabetes mellitus ([place])    SOCIAL HX:  Social History   Tobacco Use   Smoking status: Every Day    Packs/day: 1.00    Years: 62.00    Pack years: 62.00    Types: Cigarettes   Smokeless tobacco: Never  Substance Use Topics   Alcohol use: Never   FAMILY HX:  Family History  Problem Relation Age of Onset   Heart attack Mother        died @ 13.   Other Father        never knew his father.   Other Sister         overdose of sleeping pills.   Heart disease Brother        died in his 103's   Other Sister        complication of abd surgery @ 9   CAD Sister       ALLERGIES: No Known Allergies   PERTINENT MEDICATIONS:  Outpatient Encounter Medications as of 11/17/2021  Medication Sig   albuterol (VENTOLIN HFA) 108 (90 Base) MCG/ACT inhaler Inhale 2 puffs into the lungs every 6 (six) hours as needed for wheezing or shortness of breath.    apixaban (ELIQUIS) 5 MG TABS tablet Take 1 tablet (5 mg total) by mouth 2 (two) times daily.   aspirin EC 81 MG tablet Take 81 mg by mouth daily. Swallow whole. (Patient not taking: Reported on 08/23/2021)   atorvastatin (LIPITOR)  80 MG tablet Take 80 mg by mouth at bedtime.   carvedilol (COREG) 3.125 MG tablet Take 1 tablet (3.125 mg total) by mouth 2 (two) times daily.   cholecalciferol (VITAMIN D3) 25 MCG (1000 UNIT) tablet Take 1,000 Units by mouth every evening.   dapagliflozin propanediol (FARXIGA) 5 MG TABS tablet Take 5 mg by mouth daily.   DULoxetine (CYMBALTA) 30 MG capsule Take 30 mg by mouth 2 (two) times daily.   ferrous sulfate 325 (65 FE) MG tablet Take 325 mg by mouth every other day.   furosemide (LASIX) 20 MG tablet Take 1 tablet (20 mg total) by mouth daily as needed for fluid (shortness of breath).   furosemide (LASIX) 20 MG tablet Take 1 tablet (20 mg total) by mouth daily.   hydrOXYzine (ATARAX/VISTARIL) 25 MG tablet Take 25 mg by mouth every 6 (six) hours as needed (allergy symptoms).   isosorbide mononitrate (IMDUR) 30 MG 24 hr tablet Take 1 tablet (30 mg total) by mouth daily.   lamoTRIgine (LAMICTAL) 25 MG tablet Take 25 mg by mouth 2 (two) times daily.   loratadine (CLARITIN) 10 MG tablet Take 10 mg by mouth daily.   Melatonin 3 MG TABS Take 1 tablet by mouth at bedtime.   montelukast (SINGULAIR) 10 MG tablet Take 10 mg by mouth at bedtime.   Multiple Vitamins-Minerals (PRESERVISION AREDS) CAPS Take 1 capsule by mouth in the morning and at  bedtime.   nitroGLYCERIN (NITROSTAT) 0.4 MG SL tablet Place 0.4 mg under the tongue every 5 (five) minutes as needed for chest pain.   pantoprazole (PROTONIX) 40 MG tablet Take 40 mg by mouth daily.   potassium chloride (KLOR-CON) 20 MEQ packet Take 20 mEq by mouth daily. (Patient not taking: Reported on 08/23/2021)   sacubitril-valsartan (ENTRESTO) 24-26 MG Take 1 tablet by mouth 2 (two) times daily.   tamsulosin (FLOMAX) 0.4 MG CAPS capsule Take 0.4 mg by mouth daily. (Patient not taking: Reported on 08/23/2021)   vitamin B-12 (CYANOCOBALAMIN) 1000 MCG tablet Take 1,000 mcg by mouth daily.   No facility-administered encounter medications on file as of 11/17/2021.   Thank you for the opportunity to participate in the care of Mr. Bonelli.  The palliative care team will continue to follow. Please call our office at [phone] if we can be of additional assistance.   Ezekiel Slocumb, NP   COVID-19 PATIENT SCREENING TOOL Asked and negative response unless otherwise noted:  Have you had symptoms of covid, tested positive or been in contact with someone with symptoms/positive test in the past 5-10 days? No

## 2021-11-22 NOTE — Progress Notes (Signed)
Patient ID: Shawn Jennings, male    DOB: Nov 14, 1945, 76 y.o.   MRN: [id number]  HPI  Shawn Jennings is a 76 y/o male with a history of CAD, DM, hyperlipidemia, HTN, CKD, thyroid disease, COPD, depression, GERD, PAF, PAD, current tobacco use and chronic heart failure.   Echo report from 08/03/21 reviewed and showed an EF of 20-25% along with mild Shawn.   RHC/LHC done 06/06/20 and showed: Prox Cx to Mid Cx lesion is 100% stenosed. Mid LAD lesion is 100% stenosed. Prox RCA lesion is 100% stenosed. LIMA graft was visualized by non-selective angiography and is normal in caliber. The graft exhibits no disease. RIMA graft was visualized by non-selective angiography and is normal in caliber. The graft exhibits no disease.  Admitted 11/08/21 due to shortness of breath and chest pain. Cardiology consult obtained. Initially given IV heparin and IV lasix. Ruled out for NSTEMI. IV iron given for anemia. Discharged after 2 days. Admitted 08/03/21 due to shortness of breath and chest pain. Given IV lasix initially and then transitioned to oral diuretics. Tested covid +. Discharged after 2 days.   He presents today for a follow-up visit with a chief complaint of minimal fatigue upon moderate exertion. Describes this as chronic in nature having been present for several years. Has no other symptoms and specifically denies any difficulty sleeping, dizziness, abdominal distention, palpitations, pedal edema, chest pain, shortness of breath or cough.   Getting weighed most days at [facility] per the paperwork they sent with him. Weight is stable.   Past Medical History:  Diagnosis Date   Aortic stenosis    a. Pt unaware of history but CT imaging 01/2019 consistent w/ AVR; b. 01/2019 Echo: Mild to mod AS. Mean grad 67mmHg.   CAD (coronary artery disease)    a. 1994 s/p CABG ([place]); b. Reports multiple stress tests over the years w/o repeat cath; c. 01/2019 MV: large, sev, fixed inf and inflat defect extending  to apex. No ischemia. EF 37%-->Med rx given atypical Ss and pt wishes.   CHF (congestive heart failure) (HCC)    CKD (chronic kidney disease), stage III (HCC)    COPD (chronic obstructive pulmonary disease) (HCC)    Depression    Essential hypertension    GERD (gastroesophageal reflux disease)    Hyperlipidemia LDL goal <70    Ischemic cardiomyopathy    a. 01/2019 Echo: EF 40-45%, impaired relaxation. Mildly dil LA. Sev mitral annular Ca2+. Mild to mod AS.   PAD (peripheral artery disease) ([place])    a. 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   PAF (paroxysmal atrial fibrillation) (HCC)    a. CHA2DS2VASc = 4-->Xarelto 15mg  daily in setting of CKD.   Thyrotoxicosis    Tobacco abuse    Type II diabetes mellitus ([place])    Past Surgical History:  Procedure Laterality Date   CARDIAC CATHETERIZATION     CHOLECYSTECTOMY     COLONOSCOPY     COLONOSCOPY WITH PROPOFOL N/A 07/20/2021   Procedure: COLONOSCOPY WITH PROPOFOL;  Surgeon: Lin Landsman, MD;  Location: [facility] ENDOSCOPY;  Service: Gastroenterology;  Laterality: N/A;   CORONARY ARTERY BYPASS GRAFT     a. [place]   ENDOSCOPIC RETROGRADE CHOLANGIOPANCREATOGRAPHY (ERCP) WITH PROPOFOL N/A 10/19/2020   Procedure: ENDOSCOPIC RETROGRADE CHOLANGIOPANCREATOGRAPHY (ERCP) WITH PROPOFOL;  Surgeon: [name], MD;  Location: [facility] ENDOSCOPY;  Service: Endoscopy;  Laterality: N/A;   ERCP N/A 12/29/2020   Procedure: ENDOSCOPIC RETROGRADE CHOLANGIOPANCREATOGRAPHY (ERCP);  Surgeon: [name], MD;  Location:  [facility] ENDOSCOPY;  Service: Endoscopy;  Laterality: N/A;   ESOPHAGOGASTRODUODENOSCOPY (EGD) WITH PROPOFOL N/A 07/18/2021   Procedure: ESOPHAGOGASTRODUODENOSCOPY (EGD) WITH PROPOFOL;  Surgeon: Lin Landsman, MD;  Location: [facility] ENDOSCOPY;  Service: Gastroenterology;  Laterality: N/A;   Left AKA     RIGHT/LEFT HEART CATH AND CORONARY ANGIOGRAPHY N/A 06/06/2020   Procedure: RIGHT/LEFT HEART CATH AND CORONARY ANGIOGRAPHY;  Surgeon: Wellington Hampshire, MD;   Location: [facility] LAB;  Service: Cardiovascular;  Laterality: N/A;   Family History  Problem Relation Age of Onset   Heart attack Mother        died @ 65.   Other Father        never knew his father.   Other Sister        overdose of sleeping pills.   Heart disease Brother        died in his 50's   Other Sister        complication of abd surgery @ 79   CAD Sister    Social History   Tobacco Use   Smoking status: Every Day    Packs/day: 1.00    Years: 62.00    Pack years: 62.00    Types: Cigarettes   Smokeless tobacco: Never  Substance Use Topics   Alcohol use: Never   No Known Allergies  Prior to Admission medications   Medication Sig Start Date End Date Taking? Authorizing Provider  acetaminophen (TYLENOL) 500 MG tablet Take 500 mg by mouth every 6 (six) hours as needed for mild pain.   Yes [provider]  albuterol (VENTOLIN HFA) 108 (90 Base) MCG/ACT inhaler Inhale 2 puffs into the lungs every 6 (six) hours as needed for wheezing or shortness of breath.    Yes [provider]  apixaban (ELIQUIS) 5 MG TABS tablet Take 1 tablet (5 mg total) by mouth 2 (two) times daily. 07/22/21  Yes Wieting, Richard, MD  atorvastatin (LIPITOR) 80 MG tablet Take 80 mg by mouth at bedtime.   Yes [provider]  carvedilol (COREG) 3.125 MG tablet Take 1 tablet (3.125 mg total) by mouth 2 (two) times daily. 08/23/21 11/23/21 Yes Fitzroy Mikami, Otila Kluver A, FNP  cholecalciferol (VITAMIN D3) 25 MCG (1000 UNIT) tablet Take 1,000 Units by mouth every evening.   Yes [provider]  dapagliflozin propanediol (FARXIGA) 5 MG TABS tablet Take 5 mg by mouth daily.   Yes [provider]  DULoxetine (CYMBALTA) 30 MG capsule Take 30 mg by mouth 2 (two) times daily.   Yes [provider]  ferrous sulfate 325 (65 FE) MG tablet Take 325 mg by mouth every other day.   Yes [provider]  furosemide (LASIX) 20 MG tablet Take 1 tablet (20 mg total) by  mouth daily as needed for fluid (shortness of breath). 11/10/21  Yes Jennye Boroughs, MD  furosemide (LASIX) 20 MG tablet Take 1 tablet (20 mg total) by mouth daily. 11/10/21  Yes Jennye Boroughs, MD  hydrOXYzine (ATARAX/VISTARIL) 25 MG tablet Take 25 mg by mouth every 6 (six) hours as needed (allergy symptoms).   Yes [provider]  isosorbide mononitrate (IMDUR) 30 MG 24 hr tablet Take 1 tablet (30 mg total) by mouth daily. 03/11/20 05/27/29 Yes Sharen Hones, MD  lamoTRIgine (LAMICTAL) 25 MG tablet Take 25 mg by mouth 2 (two) times daily.   Yes [provider]  loratadine (CLARITIN) 10 MG tablet Take 10 mg by mouth daily.   Yes [provider]  Melatonin  3 MG TABS Take 1 tablet by mouth at bedtime.   Yes [provider]  montelukast (SINGULAIR) 10 MG tablet Take 10 mg by mouth at bedtime.   Yes [provider]  Multiple Vitamins-Minerals (PRESERVISION AREDS) CAPS Take 1 capsule by mouth in the morning and at bedtime.   Yes [provider]  nitroGLYCERIN (NITROSTAT) 0.4 MG SL tablet Place 0.4 mg under the tongue every 5 (five) minutes as needed for chest pain.   Yes [provider]  pantoprazole (PROTONIX) 40 MG tablet Take 40 mg by mouth daily.   Yes [provider]  sacubitril-valsartan (ENTRESTO) 24-26 MG Take 1 tablet by mouth 2 (two) times daily.   Yes [provider]  vitamin B-12 (CYANOCOBALAMIN) 1000 MCG tablet Take 1,000 mcg by mouth daily.   Yes [provider]   Review of Systems  Constitutional:  Positive for fatigue ("very little"). Negative for appetite change.  HENT:  Negative for congestion, postnasal drip and sore throat.   Eyes: Negative.   Respiratory:  Negative for cough, chest tightness and shortness of breath.   Cardiovascular:  Negative for chest pain, palpitations and leg swelling.  Gastrointestinal:  Negative for abdominal distention and abdominal pain.  Endocrine: Negative.    Genitourinary: Negative.   Musculoskeletal:  Negative for back pain and neck pain.  Skin: Negative.   Allergic/Immunologic: Negative.   Neurological:  Negative for dizziness and light-headedness.  Hematological:  Negative for adenopathy. Does not bruise/bleed easily.  Psychiatric/Behavioral:  Negative for dysphoric mood and sleep disturbance (sleeping on 2 pillows). The patient is not nervous/anxious.    Vitals:   11/23/21 1029  BP: 120/73  Pulse: 71  Resp: 16  SpO2: 94%  Height: 6' (1.829 m)   Wt Readings from Last 3 Encounters:  11/10/21 133 lb 13.1 oz (60.7 kg)  08/05/21 128 lb 12 oz (58.4 kg)  07/15/21 147 lb 11.3 oz (67 kg)   Lab Results  Component Value Date   CREATININE 1.50 (H) 11/10/2021   CREATININE 1.49 (H) 11/09/2021   CREATININE 1.29 (H) 11/08/2021   Physical Exam Vitals and nursing note reviewed.  Constitutional:      Appearance: Normal appearance.  HENT:     Head: Normocephalic and atraumatic.  Cardiovascular:     Rate and Rhythm: Normal rate. Rhythm irregular.  Pulmonary:     Effort: Pulmonary effort is normal. No respiratory distress.     Breath sounds: No wheezing or rales.  Abdominal:     General: There is no distension.     Palpations: Abdomen is soft.     Tenderness: There is no abdominal tenderness.  Musculoskeletal:        General: Deformity (left AKA) present.     Cervical back: Normal range of motion and neck supple.     Right lower leg: No edema.  Skin:    General: Skin is warm and dry.  Neurological:     General: No focal deficit present.     Mental Status: He is alert and oriented to person, place, and time.  Psychiatric:        Mood and Affect: Mood normal.        Behavior: Behavior normal.        Thought Content: Thought content normal.    Assessment & Plan:  1: Chronic heart failure with reduced ejection fraction- - NYHA class II - euvolemic today - getting weighed most days; reminded to call for an overnight weight gain  of >  2 pounds or a weekly weight gain of >5 pounds - weight chart reviewed; unable to safely stand in the office  - not adding salt but he says that the food tastes salty to him - on GDMT of farxiga, carvedilol & entresto - will increase farxiga dose to 10mg  daily - will also add potassium 46meq daily - order written for facility to check BMP on 12/07/21 and fax results to [place] - encouraged slow position changes when bending over at the waist - palliative care visit done 11/17/21 - BNP 08/03/21 was 1688.3 - PharmD reconciled medications  2: HTN- - BP looks good (120/73) - PCP is [facility] - BMP 11/10/21 reviewed and showed sodium 140, potassium 3.4, creatinine 1.5 and GFR 48  3: DM- - A1c 07/15/21 was 6.5%  4: Atrial fibrillation- - cardiology [facility] Rile) 02/16/21 but NS on 09/01/21 - currently taking apixaban  5: Tobacco use- - smoking 6 cigarettes daily & has been smoking since the age of 1 - no desire to quit - denies alcohol or drug use   Facility medication list reviewed.   Return in 6 weeks, sooner if needed

## 2021-11-23 ENCOUNTER — Ambulatory Visit: Payer: Medicare Other | Attending: Family | Admitting: Family

## 2021-11-23 ENCOUNTER — Other Ambulatory Visit: Payer: Self-pay

## 2021-11-23 ENCOUNTER — Encounter: Payer: Self-pay | Admitting: Family

## 2021-11-23 VITALS — BP 120/73 | HR 71 | Resp 16 | Ht 72.0 in

## 2021-11-23 DIAGNOSIS — Z8616 Personal history of COVID-19: Secondary | ICD-10-CM | POA: Insufficient documentation

## 2021-11-23 DIAGNOSIS — I10 Essential (primary) hypertension: Secondary | ICD-10-CM | POA: Diagnosis not present

## 2021-11-23 DIAGNOSIS — N1831 Chronic kidney disease, stage 3a: Secondary | ICD-10-CM

## 2021-11-23 DIAGNOSIS — E079 Disorder of thyroid, unspecified: Secondary | ICD-10-CM | POA: Insufficient documentation

## 2021-11-23 DIAGNOSIS — I2510 Atherosclerotic heart disease of native coronary artery without angina pectoris: Secondary | ICD-10-CM | POA: Diagnosis not present

## 2021-11-23 DIAGNOSIS — N183 Chronic kidney disease, stage 3 unspecified: Secondary | ICD-10-CM | POA: Insufficient documentation

## 2021-11-23 DIAGNOSIS — I480 Paroxysmal atrial fibrillation: Secondary | ICD-10-CM

## 2021-11-23 DIAGNOSIS — J449 Chronic obstructive pulmonary disease, unspecified: Secondary | ICD-10-CM | POA: Insufficient documentation

## 2021-11-23 DIAGNOSIS — E785 Hyperlipidemia, unspecified: Secondary | ICD-10-CM | POA: Diagnosis not present

## 2021-11-23 DIAGNOSIS — E1122 Type 2 diabetes mellitus with diabetic chronic kidney disease: Secondary | ICD-10-CM | POA: Diagnosis not present

## 2021-11-23 DIAGNOSIS — I130 Hypertensive heart and chronic kidney disease with heart failure and stage 1 through stage 4 chronic kidney disease, or unspecified chronic kidney disease: Secondary | ICD-10-CM | POA: Diagnosis not present

## 2021-11-23 DIAGNOSIS — I5022 Chronic systolic (congestive) heart failure: Secondary | ICD-10-CM | POA: Insufficient documentation

## 2021-11-23 DIAGNOSIS — R5383 Other fatigue: Secondary | ICD-10-CM | POA: Diagnosis present

## 2021-11-23 DIAGNOSIS — F17210 Nicotine dependence, cigarettes, uncomplicated: Secondary | ICD-10-CM | POA: Insufficient documentation

## 2021-11-23 DIAGNOSIS — Z7901 Long term (current) use of anticoagulants: Secondary | ICD-10-CM | POA: Insufficient documentation

## 2021-11-23 DIAGNOSIS — Z09 Encounter for follow-up examination after completed treatment for conditions other than malignant neoplasm: Secondary | ICD-10-CM | POA: Diagnosis not present

## 2021-11-23 DIAGNOSIS — Z720 Tobacco use: Secondary | ICD-10-CM

## 2021-11-23 DIAGNOSIS — K219 Gastro-esophageal reflux disease without esophagitis: Secondary | ICD-10-CM | POA: Insufficient documentation

## 2021-11-23 DIAGNOSIS — Z7984 Long term (current) use of oral hypoglycemic drugs: Secondary | ICD-10-CM | POA: Insufficient documentation

## 2021-11-23 DIAGNOSIS — F32A Depression, unspecified: Secondary | ICD-10-CM | POA: Insufficient documentation

## 2021-11-23 MED ORDER — DAPAGLIFLOZIN PROPANEDIOL 10 MG PO TABS: 10.0000 mg | ORAL_TABLET | Freq: Every day | ORAL | 5 refills | Status: AC

## 2021-11-23 MED ORDER — POTASSIUM CHLORIDE ER 10 MEQ PO TBCR: 10.0000 meq | EXTENDED_RELEASE_TABLET | Freq: Every day | ORAL | 3 refills | Status: DC

## 2021-11-23 NOTE — Patient Instructions (Addendum)
Continue weighing daily and call for an overnight weight gain of 3 pounds or more or a weekly weight gain of more than 5 pounds.  ° °The [facility] will be moving around the corner to [id number] mid-February. Our phone number will remain the same ° °

## 2021-11-23 NOTE — Progress Notes (Signed)
Shawn Jennings - PHARMACIST COUNSELING NOTE  Guideline-Directed Medical Therapy/Evidence Based Medicine  ACE/ARB/ARNI: Sacubitril-valsartan 24-26 mg twice daily Beta Blocker: Carvedilol 3.125 mg twice daily Aldosterone Antagonist:  None Scr is trending up.  Diuretic: Furosemide 20 mg daily SGLT2i: Dapagliflozin 5 mg daily  Adherence Assessment  Do you ever forget to take your medication? [] Yes [x] No  Do you ever skip doses due to side effects? [] Yes [x] No  Do you have trouble affording your medicines? [] Yes [x] No  Are you ever unable to pick up your medication due to transportation difficulties? [] Yes [x] No  Do you ever stop taking your medications because you don't believe they are helping? [] Yes [x] No  Do you check your weight daily? [x] Yes [] No   Adherence strategy: Patient has help with his medications.   Barriers to obtaining medications: None  Vital signs: HR 71, BP 120/73,  ECHO: Date 07/2021, EF 20-25,   BMP Latest Ref Rng & Units 11/10/2021 11/09/2021 11/08/2021  Glucose 70 - 99 mg/dL 145(H) 151(H) 148(H)  BUN 8 - 23 mg/dL 27(H) 18 17  Creatinine 0.61 - 1.24 mg/dL 1.50(H) 1.49(H) 1.29(H)  BUN/Creat Ratio 10 - 24 - - -  Sodium 135 - 145 mmol/L 140 142 139  Potassium 3.5 - 5.1 mmol/L 3.8 3.4(L) 3.8  Chloride 98 - 111 mmol/L 109 110 111  CO2 22 - 32 mmol/L 21(L) 21(L) 22  Calcium 8.9 - 10.3 mg/dL 8.8(L) 8.6(L) 8.6(L)    Past Medical History:  Diagnosis Date   Aortic stenosis    a. Pt unaware of history but CT imaging 01/2019 consistent w/ AVR; b. 01/2019 Echo: Mild to mod AS. Mean grad 72mmHg.   CAD (coronary artery disease)    a. 1994 s/p CABG ([place]); b. Reports multiple stress tests over the years w/o repeat cath; c. 01/2019 MV: large, sev, fixed inf and inflat defect extending to apex. No ischemia. EF 37%-->Med rx given atypical Ss and pt wishes.   CHF (congestive heart failure) (HCC)    CKD (chronic kidney disease),  stage III (HCC)    COPD (chronic obstructive pulmonary disease) (HCC)    Depression    Essential hypertension    GERD (gastroesophageal reflux disease)    Hyperlipidemia LDL goal <70    Ischemic cardiomyopathy    a. 01/2019 Echo: EF 40-45%, impaired relaxation. Mildly dil LA. Sev mitral annular Ca2+. Mild to mod AS.   PAD (peripheral artery disease) ([name])    a. 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   PAF (paroxysmal atrial fibrillation) (HCC)    a. CHA2DS2VASc = 4-->Xarelto 15mg  daily in setting of CKD.   Thyrotoxicosis    Tobacco abuse    Type II diabetes mellitus [facility])     ASSESSMENT 76 year old male who presents to the HF clinic for follow up appointment. Overall, pt is doing well with no complaints.   PLAN HF:  Continue current medications listed above. Recommend increasing farxiga to 10 mg and adding a potassium supplement as pt take lasix daily and had some low potassium level in the past. Scr is trending up. Monitor closely.   Afib: On coreg and apixaban   DM:  Only on farxiga.        Time spent: 20 minutes  Oswald Hillock, Pharm.D. Clinical Pharmacist 11/23/2021 10:59 AM    Current Outpatient Medications:    acetaminophen (TYLENOL) 500 MG tablet, Take 500 mg by mouth every 6 (six) hours as needed for mild pain., Disp: , Rfl:  albuterol (VENTOLIN HFA) 108 (90 Base) MCG/ACT inhaler, Inhale 2 puffs into the lungs every 6 (six) hours as needed for wheezing or shortness of breath. , Disp: , Rfl:    apixaban (ELIQUIS) 5 MG TABS tablet, Take 1 tablet (5 mg total) by mouth 2 (two) times daily., Disp: 60 tablet, Rfl: 0   atorvastatin (LIPITOR) 80 MG tablet, Take 80 mg by mouth at bedtime., Disp: , Rfl:    carvedilol (COREG) 3.125 MG tablet, Take 1 tablet (3.125 mg total) by mouth 2 (two) times daily., Disp: 60 tablet, Rfl: 3   cholecalciferol (VITAMIN D3) 25 MCG (1000 UNIT) tablet, Take 1,000 Units by mouth every evening., Disp: , Rfl:    dapagliflozin propanediol (FARXIGA) 5 MG TABS tablet,  Take 5 mg by mouth daily., Disp: , Rfl:    DULoxetine (CYMBALTA) 30 MG capsule, Take 30 mg by mouth 2 (two) times daily., Disp: , Rfl:    ferrous sulfate 325 (65 FE) MG tablet, Take 325 mg by mouth every other day., Disp: , Rfl:    furosemide (LASIX) 20 MG tablet, Take 1 tablet (20 mg total) by mouth daily as needed for fluid (shortness of breath)., Disp: 30 tablet, Rfl: 0   furosemide (LASIX) 20 MG tablet, Take 1 tablet (20 mg total) by mouth daily., Disp: 30 tablet, Rfl: 0   hydrOXYzine (ATARAX/VISTARIL) 25 MG tablet, Take 25 mg by mouth every 6 (six) hours as needed (allergy symptoms)., Disp: , Rfl:    isosorbide mononitrate (IMDUR) 30 MG 24 hr tablet, Take 1 tablet (30 mg total) by mouth daily., Disp: 30 tablet, Rfl: 0   lamoTRIgine (LAMICTAL) 25 MG tablet, Take 25 mg by mouth 2 (two) times daily., Disp: , Rfl:    loratadine (CLARITIN) 10 MG tablet, Take 10 mg by mouth daily., Disp: , Rfl:    Melatonin 3 MG TABS, Take 1 tablet by mouth at bedtime., Disp: , Rfl:    montelukast (SINGULAIR) 10 MG tablet, Take 10 mg by mouth at bedtime., Disp: , Rfl:    Multiple Vitamins-Minerals (PRESERVISION AREDS) CAPS, Take 1 capsule by mouth in the morning and at bedtime., Disp: , Rfl:    nitroGLYCERIN (NITROSTAT) 0.4 MG SL tablet, Place 0.4 mg under the tongue every 5 (five) minutes as needed for chest pain., Disp: , Rfl:    pantoprazole (PROTONIX) 40 MG tablet, Take 40 mg by mouth daily., Disp: , Rfl:    sacubitril-valsartan (ENTRESTO) 24-26 MG, Take 1 tablet by mouth 2 (two) times daily., Disp: , Rfl:    vitamin B-12 (CYANOCOBALAMIN) 1000 MCG tablet, Take 1,000 mcg by mouth daily., Disp: , Rfl:      MEDICATION ADHERENCES TIPS AND STRATEGIES Taking medication as prescribed improves patient outcomes in heart failure (reduces hospitalizations, improves symptoms, increases survival) Side effects of medications can be managed by decreasing doses, switching agents, stopping drugs, or adding additional  therapy. Please let someone in the [facility] know if you have having bothersome side effects so we can modify your regimen. Do not alter your medication regimen without talking to [place].  Medication reminders can help patients remember to take drugs on time. If you are missing or forgetting doses you can try linking behaviors, using pill boxes, or an electronic reminder like an alarm on your phone or an app. Some people can also get automated phone calls as medication reminders.

## 2021-12-21 ENCOUNTER — Ambulatory Visit (HOSPITAL_BASED_OUTPATIENT_CLINIC_OR_DEPARTMENT_OTHER): Payer: Medicare Other | Admitting: Family

## 2021-12-21 ENCOUNTER — Other Ambulatory Visit
Admission: RE | Admit: 2021-12-21 | Discharge: 2021-12-21 | Disposition: A | Discharge status: Home / Self Care | Payer: Medicare Other | Source: Ambulatory Visit | Attending: Family | Admitting: Family

## 2021-12-21 ENCOUNTER — Encounter: Payer: Self-pay | Admitting: Family

## 2021-12-21 ENCOUNTER — Other Ambulatory Visit: Payer: Self-pay

## 2021-12-21 ENCOUNTER — Encounter: Payer: Self-pay | Admitting: Pharmacist

## 2021-12-21 ENCOUNTER — Ambulatory Visit: Payer: Medicare Other | Admitting: Family

## 2021-12-21 VITALS — BP 133/70 | HR 65 | Resp 16 | Ht 72.0 in

## 2021-12-21 DIAGNOSIS — Z7901 Long term (current) use of anticoagulants: Secondary | ICD-10-CM | POA: Insufficient documentation

## 2021-12-21 DIAGNOSIS — I5022 Chronic systolic (congestive) heart failure: Secondary | ICD-10-CM | POA: Insufficient documentation

## 2021-12-21 DIAGNOSIS — F32A Depression, unspecified: Secondary | ICD-10-CM | POA: Insufficient documentation

## 2021-12-21 DIAGNOSIS — J449 Chronic obstructive pulmonary disease, unspecified: Secondary | ICD-10-CM | POA: Insufficient documentation

## 2021-12-21 DIAGNOSIS — E785 Hyperlipidemia, unspecified: Secondary | ICD-10-CM | POA: Insufficient documentation

## 2021-12-21 DIAGNOSIS — E1122 Type 2 diabetes mellitus with diabetic chronic kidney disease: Secondary | ICD-10-CM | POA: Insufficient documentation

## 2021-12-21 DIAGNOSIS — I130 Hypertensive heart and chronic kidney disease with heart failure and stage 1 through stage 4 chronic kidney disease, or unspecified chronic kidney disease: Secondary | ICD-10-CM | POA: Insufficient documentation

## 2021-12-21 DIAGNOSIS — F17210 Nicotine dependence, cigarettes, uncomplicated: Secondary | ICD-10-CM | POA: Insufficient documentation

## 2021-12-21 DIAGNOSIS — I10 Essential (primary) hypertension: Secondary | ICD-10-CM

## 2021-12-21 DIAGNOSIS — I480 Paroxysmal atrial fibrillation: Secondary | ICD-10-CM | POA: Diagnosis not present

## 2021-12-21 DIAGNOSIS — N1831 Chronic kidney disease, stage 3a: Secondary | ICD-10-CM

## 2021-12-21 DIAGNOSIS — I2510 Atherosclerotic heart disease of native coronary artery without angina pectoris: Secondary | ICD-10-CM | POA: Insufficient documentation

## 2021-12-21 DIAGNOSIS — K219 Gastro-esophageal reflux disease without esophagitis: Secondary | ICD-10-CM | POA: Insufficient documentation

## 2021-12-21 DIAGNOSIS — Z720 Tobacco use: Secondary | ICD-10-CM

## 2021-12-21 DIAGNOSIS — E079 Disorder of thyroid, unspecified: Secondary | ICD-10-CM | POA: Insufficient documentation

## 2021-12-21 LAB — BASIC METABOLIC PANEL
Anion gap: 7 (ref 5–15)
BUN: 26 mg/dL — ABNORMAL HIGH (ref 8–23)
CO2: 23 mmol/L (ref 22–32)
Calcium: 8.7 mg/dL — ABNORMAL LOW (ref 8.9–10.3)
Chloride: 109 mmol/L (ref 98–111)
Creatinine, Ser: 1.31 mg/dL — ABNORMAL HIGH (ref 0.61–1.24)
GFR, Estimated: 56 mL/min — ABNORMAL LOW (ref >60)
Glucose, Bld: 129 mg/dL — ABNORMAL HIGH (ref 70–99)
Potassium: 4 mmol/L (ref 3.5–5.1)
Sodium: 139 mmol/L (ref 135–145)

## 2021-12-21 NOTE — Progress Notes (Signed)
[facility] COUNSELING NOTE ? ?Guideline-Directed Medical Therapy/Evidence Based Medicine ? ?ACE/ARB/ARNI: Sacubitril-valsartan 24-26 mg twice daily ?Beta Blocker: Carvedilol 3.125 mg  mg twice daily ?Aldosterone Antagonist:  none ?Diuretic: Furosemide 20 mg daily , plus additional 20mg  as needed of fluid retention ?SGLT2i: Dapagliflozin 10 mg daily ? ?Adherence Assessment ? ?Do you ever forget to take your medication? [] Yes ?[x] No  ?Do you ever skip doses due to side effects? [] Yes ?[x] No  ?Do you have trouble affording your medicines? [] Yes ?[x] No  ?Are you ever unable to pick up your medication due to transportation difficulties? [] Yes ?[x] No  ?Do you ever stop taking your medications because you don't believe they are helping? [] Yes ?[x] No  ?Do you check your weight daily? [] Yes ?[x] No  ? ?Adherence strategy: [facility] responsible for medication administration ? ?Barriers to obtaining medications: none identified ? ?Vital signs: HR 65, BP 133/70, weight (pounds) no weight today ?ECHO: Date 08/03/21, EF 20-25%, mild MR ? ?BMP Latest Ref Rng & Units 11/10/2021 11/09/2021 11/08/2021  ?Glucose 70 - 99 mg/dL 145(H) 151(H) 148(H)  ?BUN 8 - 23 mg/dL 27(H) 18 17  ?Creatinine 0.61 - 1.24 mg/dL 1.50(H) 1.49(H) 1.29(H)  ?BUN/Creat Ratio 10 - 24 - - -  ?Sodium 135 - 145 mmol/L 140 142 139  ?Potassium 3.5 - 5.1 mmol/L 3.8 3.4(L) 3.8  ?Chloride 98 - 111 mmol/L 109 110 111  ?CO2 22 - 32 mmol/L 21(L) 21(L) 22  ?Calcium 8.9 - 10.3 mg/dL 8.8(L) 8.6(L) 8.6(L)  ? ? ?Past Medical History:  ?Diagnosis Date  ? Aortic stenosis   ? a. Pt unaware of history but CT imaging 01/2019 consistent w/ AVR; b. 01/2019 Echo: Mild to mod AS. Mean grad 72mmHg.  ? CAD (coronary artery disease)   ? a. 1994 s/p CABG ([place]); b. Reports multiple stress tests over the years w/o repeat cath; c. 01/2019 MV: large, sev, fixed inf and inflat defect extending to apex. No ischemia. EF 37%-->Med rx  given atypical Ss and pt wishes.  ? CHF (congestive heart failure) ([place])   ? CKD (chronic kidney disease), stage III ([place])   ? COPD (chronic obstructive pulmonary disease) ([place])   ? Depression   ? Essential hypertension   ? GERD (gastroesophageal reflux disease)   ? Hyperlipidemia LDL goal <70   ? Ischemic cardiomyopathy   ? a. 01/2019 Echo: EF 40-45%, impaired relaxation. Mildly dil LA. Sev mitral annular Ca2+. Mild to mod AS.  ? PAD (peripheral artery disease) ([place])   ? a. 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  ? PAF (paroxysmal atrial fibrillation) ([place])   ? a. CHA2DS2VASc = 4-->Xarelto 15mg  daily in setting of CKD.  ? Thyrotoxicosis   ? Tobacco abuse   ? Type II diabetes mellitus ([place])   ? ? ?ASSESSMENT ?76 year old male who presents to the HF clinic for follow up. PMH includes CAD/PAD, DM, HLD, HTN, CKD, thyroid, COPD, depression,and HFrEF. Patient showed up for his appointment on wrong date and RN completed MedRec while I was with another patient. He is a resident of [facility]. I reviewed his medication list and vitals today. Patient looks stable and denies acute symptoms. Recent ED Visit was 11/08/21 for unstable angina.  ? ? ?PLAN ?GDMT with Farxiga, carvedilol, and Entresto - blood pressure stable, no recent wt for assessment and patient unable to ambulate without assistance. ?No changes recommended today.  ?Will recommend adding low dose spironolactone if BP remains stable. ? ? ?Time spent:  10 minutes ? ?Praise Stennett Rodriguez-Guzman PharmD, BCPS ?12/21/2021 12:24 PM ? ? ? ?Current Outpatient Medications:  ?  acetaminophen (TYLENOL) 500 MG tablet, Take 500 mg by mouth every 6 (six) hours as needed for mild pain., Disp: , Rfl:  ?  albuterol (VENTOLIN HFA) 108 (90 Base) MCG/ACT inhaler, Inhale 2 puffs into the lungs every 6 (six) hours as needed for wheezing or shortness of breath. , Disp: , Rfl:  ?  apixaban (ELIQUIS) 5 MG TABS tablet, Take 1 tablet (5 mg total) by mouth 2 (two) times daily., Disp: 60 tablet, Rfl: 0 ?  atorvastatin  (LIPITOR) 80 MG tablet, Take 80 mg by mouth at bedtime., Disp: , Rfl:  ?  carvedilol (COREG) 3.125 MG tablet, Take 1 tablet (3.125 mg total) by mouth 2 (two) times daily., Disp: 60 tablet, Rfl: 3 ?  cholecalciferol (VITAMIN D3) 25 MCG (1000 UNIT) tablet, Take 1,000 Units by mouth every evening., Disp: , Rfl:  ?  dapagliflozin propanediol (FARXIGA) 10 MG TABS tablet, Take 1 tablet (10 mg total) by mouth daily., Disp: 30 tablet, Rfl: 5 ?  DULoxetine (CYMBALTA) 30 MG capsule, Take 30 mg by mouth 2 (two) times daily., Disp: , Rfl:  ?  ferrous sulfate 325 (65 FE) MG tablet, Take 325 mg by mouth every other day., Disp: , Rfl:  ?  furosemide (LASIX) 20 MG tablet, Take 1 tablet (20 mg total) by mouth daily as needed for fluid (shortness of breath)., Disp: 30 tablet, Rfl: 0 ?  furosemide (LASIX) 20 MG tablet, Take 1 tablet (20 mg total) by mouth daily., Disp: 30 tablet, Rfl: 0 ?  hydrOXYzine (ATARAX/VISTARIL) 25 MG tablet, Take 25 mg by mouth every 6 (six) hours as needed (allergy symptoms)., Disp: , Rfl:  ?  isosorbide mononitrate (IMDUR) 30 MG 24 hr tablet, Take 1 tablet (30 mg total) by mouth daily., Disp: 30 tablet, Rfl: 0 ?  lamoTRIgine (LAMICTAL) 25 MG tablet, Take 25 mg by mouth 2 (two) times daily., Disp: , Rfl:  ?  loratadine (CLARITIN) 10 MG tablet, Take 10 mg by mouth daily., Disp: , Rfl:  ?  Melatonin 3 MG TABS, Take 1 tablet by mouth at bedtime., Disp: , Rfl:  ?  montelukast (SINGULAIR) 10 MG tablet, Take 10 mg by mouth at bedtime., Disp: , Rfl:  ?  Multiple Vitamins-Minerals (PRESERVISION AREDS) CAPS, Take 1 capsule by mouth in the morning and at bedtime., Disp: , Rfl:  ?  nitroGLYCERIN (NITROSTAT) 0.4 MG SL tablet, Place 0.4 mg under the tongue every 5 (five) minutes as needed for chest pain., Disp: , Rfl:  ?  pantoprazole (PROTONIX) 40 MG tablet, Take 40 mg by mouth daily., Disp: , Rfl:  ?  potassium chloride (KLOR-CON) 10 MEQ tablet, Take 1 tablet (10 mEq total) by mouth daily., Disp: 90 tablet, Rfl: 3 ?   sacubitril-valsartan (ENTRESTO) 24-26 MG, Take 1 tablet by mouth 2 (two) times daily., Disp: , Rfl:  ?  vitamin B-12 (CYANOCOBALAMIN) 1000 MCG tablet, Take 1,000 mcg by mouth daily., Disp: , Rfl:  ? ? ?MEDICATION ADHERENCES TIPS AND STRATEGIES ?Taking medication as prescribed improves patient outcomes in heart failure (reduces hospitalizations, improves symptoms, increases survival) ?Side effects of medications can be managed by decreasing doses, switching agents, stopping drugs, or adding additional therapy. Please let someone in the [facility] know if you have having bothersome side effects so we can modify your regimen. Do not alter your medication regimen without talking to [place].  ?Medication reminders can  help patients remember to take drugs on time. If you are missing or forgetting doses you can try linking behaviors, using pill boxes, or an electronic reminder like an alarm on your phone or an app. Some people can also get automated phone calls as medication reminders.  ? ?

## 2021-12-21 NOTE — Progress Notes (Signed)
Patient ID: Shawn Jennings, male    DOB: November 20, 1945, 76 y.o.   MRN: [id number]  HPI  [name] is a 76 y/o male with a history of CAD, DM, hyperlipidemia, HTN, CKD, thyroid disease, COPD, depression, GERD, PAF, PAD, current tobacco use and chronic heart failure.   Echo report from 08/03/21 reviewed and showed an EF of 20-25% along with mild MR.   RHC/LHC done 06/06/20 and showed: Prox Cx to Mid Cx lesion is 100% stenosed. Mid LAD lesion is 100% stenosed. Prox RCA lesion is 100% stenosed. LIMA graft was visualized by non-selective angiography and is normal in caliber. The graft exhibits no disease. RIMA graft was visualized by non-selective angiography and is normal in caliber. The graft exhibits no disease.  Admitted 11/08/21 due to shortness of breath and chest pain. Cardiology consult obtained. Initially given IV heparin and IV lasix. Ruled out for NSTEMI. IV iron given for anemia. Discharged after 2 days. Admitted 08/03/21 due to shortness of breath and chest pain. Given IV lasix initially and then transitioned to oral diuretics. Tested covid +. Discharged after 2 days.   He presents today with a chief complaint of a follow-up visit. Currently denies any issues and specifically denies any difficulty sleeping, dizziness, abdominal distention, palpitations, pedal edema, chest pain, shortness of breath, cough, fatigue or weight gain.   Does report some right shoulder pain that has been present for ~ 2 weeks after a fall at [facility]. Says that they've xrayed it.   Past Medical History:  Diagnosis Date   Aortic stenosis    a. Pt unaware of history but CT imaging 01/2019 consistent w/ AVR; b. 01/2019 Echo: Mild to mod AS. Mean grad 18mmHg.   CAD (coronary artery disease)    a. 1994 s/p CABG ([place]); b. Reports multiple stress tests over the years w/o repeat cath; c. 01/2019 MV: large, sev, fixed inf and inflat defect extending to apex. No ischemia. EF 37%-->Med rx given atypical Ss  and pt wishes.   CHF (congestive heart failure) (HCC)    CKD (chronic kidney disease), stage III (HCC)    COPD (chronic obstructive pulmonary disease) (HCC)    Depression    Essential hypertension    GERD (gastroesophageal reflux disease)    Hyperlipidemia LDL goal <70    Ischemic cardiomyopathy    a. 01/2019 Echo: EF 40-45%, impaired relaxation. Mildly dil LA. Sev mitral annular Ca2+. Mild to mod AS.   PAD (peripheral artery disease) ([place])    a. 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   PAF (paroxysmal atrial fibrillation) (HCC)    a. CHA2DS2VASc = 4-->Xarelto 15mg  daily in setting of CKD.   Thyrotoxicosis    Tobacco abuse    Type II diabetes mellitus ([place])    Past Surgical History:  Procedure Laterality Date   CARDIAC CATHETERIZATION     CHOLECYSTECTOMY     COLONOSCOPY     COLONOSCOPY WITH PROPOFOL N/A 07/20/2021   Procedure: COLONOSCOPY WITH PROPOFOL;  Surgeon: Lin Landsman, MD;  Location: [facility] ENDOSCOPY;  Service: Gastroenterology;  Laterality: N/A;   CORONARY ARTERY BYPASS GRAFT     a. [place]   ENDOSCOPIC RETROGRADE CHOLANGIOPANCREATOGRAPHY (ERCP) WITH PROPOFOL N/A 10/19/2020   Procedure: ENDOSCOPIC RETROGRADE CHOLANGIOPANCREATOGRAPHY (ERCP) WITH PROPOFOL;  Surgeon: [name], MD;  Location: [facility] ENDOSCOPY;  Service: Endoscopy;  Laterality: N/A;   ERCP N/A 12/29/2020   Procedure: ENDOSCOPIC RETROGRADE CHOLANGIOPANCREATOGRAPHY (ERCP);  Surgeon: [name], MD;  Location: [facility] ENDOSCOPY;  Service: Endoscopy;  Laterality:  N/A;   ESOPHAGOGASTRODUODENOSCOPY (EGD) WITH PROPOFOL N/A 07/18/2021   Procedure: ESOPHAGOGASTRODUODENOSCOPY (EGD) WITH PROPOFOL;  Surgeon: Lin Landsman, MD;  Location: [facility];  Service: Gastroenterology;  Laterality: N/A;   Left AKA     RIGHT/LEFT HEART CATH AND CORONARY ANGIOGRAPHY N/A 06/06/2020   Procedure: RIGHT/LEFT HEART CATH AND CORONARY ANGIOGRAPHY;  Surgeon: Wellington Hampshire, MD;  Location: [facility];  Service: Cardiovascular;   Laterality: N/A;   Family History  Problem Relation Age of Onset   Heart attack Mother        died @ 65.   Other Father        never knew his father.   Other Sister        overdose of sleeping pills.   Heart disease Brother        died in his 48's   Other Sister        complication of abd surgery @ 60   CAD Sister    Social History   Tobacco Use   Smoking status: Every Day    Packs/day: 1.00    Years: 62.00    Pack years: 62.00    Types: Cigarettes   Smokeless tobacco: Never  Substance Use Topics   Alcohol use: Never   No Known Allergies  Prior to Admission medications   Medication Sig Start Date End Date Taking? Authorizing Provider  acetaminophen (TYLENOL) 500 MG tablet Take 500 mg by mouth every 6 (six) hours as needed for mild pain.   Yes [provider]  albuterol (VENTOLIN HFA) 108 (90 Base) MCG/ACT inhaler Inhale 2 puffs into the lungs every 6 (six) hours as needed for wheezing or shortness of breath.    Yes [provider]  apixaban (ELIQUIS) 5 MG TABS tablet Take 1 tablet (5 mg total) by mouth 2 (two) times daily. 07/22/21  Yes Wieting, Richard, MD  atorvastatin (LIPITOR) 80 MG tablet Take 80 mg by mouth at bedtime.   Yes [provider]  carvedilol (COREG) 3.125 MG tablet Take 1 tablet (3.125 mg total) by mouth 2 (two) times daily. 08/23/21  Yes Lindy Garczynski, Otila Kluver A, FNP  cholecalciferol (VITAMIN D3) 25 MCG (1000 UNIT) tablet Take 1,000 Units by mouth every evening.   Yes [provider]  dapagliflozin propanediol (FARXIGA) 10 MG TABS tablet Take 1 tablet (10 mg total) by mouth daily. 11/23/21  Yes Verneta Hamidi, Otila Kluver A, FNP  DULoxetine (CYMBALTA) 30 MG capsule Take 30 mg by mouth 2 (two) times daily.   Yes [provider]  ferrous sulfate 325 (65 FE) MG tablet Take 325 mg by mouth every other day.   Yes [provider]  furosemide (LASIX) 20 MG tablet Take 1 tablet (20 mg total) by mouth daily as needed for fluid (shortness of  breath). 11/10/21  Yes Jennye Boroughs, MD  furosemide (LASIX) 20 MG tablet Take 1 tablet (20 mg total) by mouth daily. 11/10/21  Yes Jennye Boroughs, MD  hydrOXYzine (ATARAX/VISTARIL) 25 MG tablet Take 25 mg by mouth every 6 (six) hours as needed (allergy symptoms).   Yes [provider]  isosorbide mononitrate (IMDUR) 30 MG 24 hr tablet Take 1 tablet (30 mg total) by mouth daily. 03/11/20 05/27/29 Yes Sharen Hones, MD  lamoTRIgine (LAMICTAL) 25 MG tablet Take 25 mg by mouth 2 (two) times daily.   Yes [provider]  loratadine (CLARITIN) 10 MG tablet Take 10 mg by mouth daily.   Yes [provider]  Melatonin 3 MG TABS  Take 1 tablet by mouth at bedtime.   Yes [provider]  montelukast (SINGULAIR) 10 MG tablet Take 10 mg by mouth at bedtime.   Yes [provider]  Multiple Vitamins-Minerals (PRESERVISION AREDS) CAPS Take 1 capsule by mouth in the morning and at bedtime.   Yes [provider]  nitroGLYCERIN (NITROSTAT) 0.4 MG SL tablet Place 0.4 mg under the tongue every 5 (five) minutes as needed for chest pain.   Yes [provider]  pantoprazole (PROTONIX) 40 MG tablet Take 40 mg by mouth daily.   Yes [provider]  potassium chloride (KLOR-CON) 10 MEQ tablet Take 1 tablet (10 mEq total) by mouth daily. 11/23/21 02/21/22 Yes Tiari Andringa A, FNP  sacubitril-valsartan (ENTRESTO) 24-26 MG Take 1 tablet by mouth 2 (two) times daily.   Yes [provider]  vitamin B-12 (CYANOCOBALAMIN) 1000 MCG tablet Take 1,000 mcg by mouth daily.   Yes [provider]    Review of Systems  Constitutional:  Negative for appetite change.  HENT:  Negative for congestion, postnasal drip and sore throat.   Eyes: Negative.   Respiratory:  Negative for cough and chest tightness.   Cardiovascular:  Negative for leg swelling.  Gastrointestinal:  Negative for abdominal distention.  Endocrine: Negative.   Genitourinary: Negative.    Musculoskeletal:  Negative for back pain and neck pain.  Skin: Negative.   Allergic/Immunologic: Negative.   Neurological:  Negative for dizziness and light-headedness.  Hematological:  Negative for adenopathy. Does not bruise/bleed easily.  Psychiatric/Behavioral:  Negative for dysphoric mood and sleep disturbance (sleeping on 2 pillows). The patient is not nervous/anxious.    Vitals:   12/21/21 0959  BP: 133/70  Pulse: 65  Resp: 16  SpO2: 99%  Height: 6' (1.829 m)   Wt Readings from Last 3 Encounters:  11/10/21 133 lb 13.1 oz (60.7 kg)  08/05/21 128 lb 12 oz (58.4 kg)  07/15/21 147 lb 11.3 oz (67 kg)   Lab Results  Component Value Date   CREATININE 1.50 (H) 11/10/2021   CREATININE 1.49 (H) 11/09/2021   CREATININE 1.29 (H) 11/08/2021   Physical Exam Vitals and nursing note reviewed.  Constitutional:      Appearance: Normal appearance.  HENT:     Head: Normocephalic and atraumatic.  Cardiovascular:     Rate and Rhythm: Normal rate. Rhythm irregular.  Pulmonary:     Effort: Pulmonary effort is normal. No respiratory distress.     Breath sounds: No wheezing or rales.  Abdominal:     General: There is no distension.     Palpations: Abdomen is soft.     Tenderness: There is no abdominal tenderness.  Musculoskeletal:        General: Deformity (left AKA) present.     Cervical back: Normal range of motion and neck supple.     Right lower leg: No edema.  Skin:    General: Skin is warm and dry.  Neurological:     General: No focal deficit present.     Mental Status: He is alert and oriented to person, place, and time.  Psychiatric:        Mood and Affect: Mood normal.        Behavior: Behavior normal.        Thought Content: Thought content normal.    Assessment & Plan:  1: Chronic heart failure with reduced ejection fraction- - NYHA class II - euvolemic today - getting weighed most days; reminded to call for an  overnight weight gain of > 2 pounds or a weekly  weight gain of >5 pounds - weight chart reviewed;   - not adding salt but admits that he "loves" salt; encouraged him to continue to not add any - on GDMT of farxiga, carvedilol & entresto - consider MRA but unsure if labs could get done at week 1 and then week 4 unless we brought patient in for office visit - check BMP today - encouraged slow position changes when bending over at the waist - palliative care visit done 11/17/21 - BNP 08/03/21 was 1688.3 - PharmD reconciled medications  2: HTN- - BP looks good (133/70) - PCP is [facility] - BMP 11/10/21 reviewed and showed sodium 140, potassium 3.4, creatinine 1.5 and GFR 48  3: DM- - A1c 07/15/21 was 6.5%  4: Atrial fibrillation- - cardiology Gilford Rile) 02/16/21 but NS on 09/01/21 - currently taking apixaban  5: Tobacco use- - smoking 6 cigarettes daily & has been smoking since the age of 33 - no desire to quit - denies alcohol or drug use   Facility medication list reviewed.   Return in 1 month, sooner if needed.

## 2021-12-21 NOTE — Patient Instructions (Signed)
Continue weighing daily and call for an overnight weight gain of 3 pounds or more or a weekly weight gain of more than 5 pounds.  °

## 2021-12-21 NOTE — Progress Notes (Signed)
Duplicate

## 2022-01-05 ENCOUNTER — Ambulatory Visit: Payer: Medicare Other | Admitting: Family

## 2022-01-19 ENCOUNTER — Encounter (HOSPITAL_COMMUNITY): Payer: Self-pay | Admitting: Radiology

## 2022-01-24 ENCOUNTER — Ambulatory Visit: Payer: Medicare Other | Admitting: Family

## 2022-02-06 ENCOUNTER — Ambulatory Visit: Payer: Medicare Other | Admitting: Family

## 2022-02-09 ENCOUNTER — Non-Acute Institutional Stay: Payer: Medicare Other | Admitting: Student

## 2022-02-09 DIAGNOSIS — I5042 Chronic combined systolic (congestive) and diastolic (congestive) heart failure: Secondary | ICD-10-CM

## 2022-02-09 DIAGNOSIS — Z515 Encounter for palliative care: Secondary | ICD-10-CM

## 2022-02-09 DIAGNOSIS — Z720 Tobacco use: Secondary | ICD-10-CM

## 2022-02-09 NOTE — Progress Notes (Signed)
? ? ?Manufacturing engineer ?Community Palliative Care Consult Note ?Telephone: [phone]  ?Fax: [phone]  ? ? ?Date of encounter: 02/09/22 ?12:48 PM ?PATIENT NAME: Shawn Jennings ?[place] ?[place] [address]   ?[phone] (home)  ?DOB: [birth year] ?MRN: [id number] ?PRIMARY CARE PROVIDER:    ?[facility],  ?[address] ?[place] [address] ?[phone] ? ?REFERRING PROVIDER:   ?[facility] ?[address] ?[place],  [place] [address] ?[phone] ? ?RESPONSIBLE PARTY:    ?Contact Information   ? ? Name Relation Home Work Mobile  ? Smith,Stephanie Friend [phone]    ? ?  ? ? ? ?I met face to face with patient  in the facility. Palliative Care was asked to follow this patient by consultation request of  [facility] to address advance care planning and complex medical decision making. This is a follow up visit. ? ?                                 ASSESSMENT AND PLAN / RECOMMENDATIONS:  ? ?Advance Care Planning/Goals of Care: Goals include to maximize quality of life and symptom management. Patient/health care surrogate gave his/her permission to discuss. ?Our advance care planning conversation included a discussion about:    ?The value and importance of advance care planning  ?Experiences with loved ones who have been seriously ill or have died  ?Exploration of personal, cultural or spiritual beliefs that might influence medical decisions  ?Exploration of goals of care in the event of a sudden injury or illness  ?CODE STATUS: Full Code ? ?Palliative medicine will continue to provide support, provide symptom management as needed. Monitor for changes and declines.  ? ?Symptom Management/Plan: ? 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 Continue carvedilol 3.125 mg BID, entresto 24-26 mg BID, furosemide 20 mg QD and QD PRN for shortness of breath, edema, dapagliflozin 10 mg daily. Monitor for worsening  shortness of breath and edema. Patient only weighing routinely, recommend daily weights at facility. Follow up with HF clinic as scheduled.  ? ?Smoking cessation- patient continues to smoke.  Education provided on smoking cessation; declines intervention at this time.  ? ? ?Follow up Palliative Care Visit: Palliative care will continue to follow for complex medical decision making, advance care planning, and clarification of goals. Return in 8-12 weeks or prn. ? ? ?This visit was coded based on medical decision making (MDM). ? ?PPS: 50% ? ?HOSPICE ELIGIBILITY/DIAGNOSIS: TBD ? ?Chief Complaint: Palliative Medicine follow up visit.  ? ?HISTORY OF PRESENT ILLNESS:  Shawn Jennings is a 76 y.o. year old male  with acute on chronic CHF, ischemic chest pain, atrial fibrillation, CKD 3, type 2 DM. Hx of GI bleed.  ? ?Patient resides at [facility].  He reports doing well.  He does express wanting to move to an independent living apartment; he is seen by Education officer, museum routinely. He denies having any pain, chest pain, weakness, lower extremity edema. He denies shortness of breath at present; endorses occasional shortness of breath at night. Oxygen had been order PRN per his PCP.  He endorses having a good appetite.  He states he is sleeping well at night. No recent falls or injury reported. Patient is still smoking. No recent ED visits or hospitalizations since last palliative medicine visit.  A 10 point review of systems is negative, except for the pertinent positives  and negatives detailed per the HPI. ? ?History obtained from review of EMR, discussion with primary team, and interview with family, facility staff/caregiver and/or Shawn Jennings.  ?I reviewed available labs, medications, imaging, studies and related documents from the EMR.  Records reviewed and summarized above.  ? ? ?Physical Exam: ?Pulse 96, respirations 16, BP 110/78, sats 94% on room air. ?Constitutional: NAD ?General: frail  appearing ?EYES: anicteric sclera, lids intact, no discharge  ?ENMT: intact hearing, oral mucous membranes moist, dentition intact ?CV: S1S2, RRR, no LE edema ?Pulmonary: LCTA, no increased work of breathing, no cough, room air ?Abdomen:  normo-active BS + 4 quadrants, soft and non tender, no ascites ?GU: deferred ?MSK: left AKA, wheelchair-bound ?Skin: warm and dry, no rashes or wounds on visible skin ?Neuro:  no generalized weakness,  no cognitive impairment ?Psych: non-anxious affect, A and O x 3, pleasant ?Hem/lymph/immuno: no widespread bruising ? ? ?Thank you for the opportunity to participate in the care of Shawn Jennings.  The palliative care team will continue to follow. Please call our office at [phone] if we can be of additional assistance.  ? ?Ezekiel Slocumb, NP  ? ?COVID-19 PATIENT SCREENING TOOL ?Asked and negative response unless otherwise noted:  ? ?Have you had symptoms of covid, tested positive or been in contact with someone with symptoms/positive test in the past 5-10 days? No ? ?

## 2022-02-14 ENCOUNTER — Encounter: Payer: Self-pay | Admitting: Family

## 2022-02-14 ENCOUNTER — Ambulatory Visit: Payer: Medicare Other | Attending: Family | Admitting: Family

## 2022-02-14 VITALS — BP 118/74 | HR 71 | Resp 16 | Ht 72.0 in

## 2022-02-14 DIAGNOSIS — E1122 Type 2 diabetes mellitus with diabetic chronic kidney disease: Secondary | ICD-10-CM | POA: Diagnosis not present

## 2022-02-14 DIAGNOSIS — K219 Gastro-esophageal reflux disease without esophagitis: Secondary | ICD-10-CM | POA: Diagnosis not present

## 2022-02-14 DIAGNOSIS — I130 Hypertensive heart and chronic kidney disease with heart failure and stage 1 through stage 4 chronic kidney disease, or unspecified chronic kidney disease: Secondary | ICD-10-CM | POA: Diagnosis not present

## 2022-02-14 DIAGNOSIS — I2510 Atherosclerotic heart disease of native coronary artery without angina pectoris: Secondary | ICD-10-CM | POA: Diagnosis not present

## 2022-02-14 DIAGNOSIS — E079 Disorder of thyroid, unspecified: Secondary | ICD-10-CM | POA: Insufficient documentation

## 2022-02-14 DIAGNOSIS — I10 Essential (primary) hypertension: Secondary | ICD-10-CM

## 2022-02-14 DIAGNOSIS — Z7901 Long term (current) use of anticoagulants: Secondary | ICD-10-CM | POA: Diagnosis not present

## 2022-02-14 DIAGNOSIS — F17210 Nicotine dependence, cigarettes, uncomplicated: Secondary | ICD-10-CM | POA: Insufficient documentation

## 2022-02-14 DIAGNOSIS — I5022 Chronic systolic (congestive) heart failure: Secondary | ICD-10-CM | POA: Diagnosis not present

## 2022-02-14 DIAGNOSIS — F32A Depression, unspecified: Secondary | ICD-10-CM | POA: Diagnosis not present

## 2022-02-14 DIAGNOSIS — I480 Paroxysmal atrial fibrillation: Secondary | ICD-10-CM | POA: Diagnosis not present

## 2022-02-14 DIAGNOSIS — Z720 Tobacco use: Secondary | ICD-10-CM

## 2022-02-14 DIAGNOSIS — E785 Hyperlipidemia, unspecified: Secondary | ICD-10-CM | POA: Diagnosis not present

## 2022-02-14 DIAGNOSIS — N1831 Chronic kidney disease, stage 3a: Secondary | ICD-10-CM

## 2022-02-14 DIAGNOSIS — J449 Chronic obstructive pulmonary disease, unspecified: Secondary | ICD-10-CM | POA: Diagnosis not present

## 2022-02-14 DIAGNOSIS — I200 Unstable angina: Secondary | ICD-10-CM | POA: Diagnosis not present

## 2022-02-14 NOTE — Patient Instructions (Signed)
Continue weighing daily and call for an overnight weight gain of 3 pounds or more or a weekly weight gain of more than 5 pounds.  °

## 2022-02-14 NOTE — Progress Notes (Signed)
? Patient ID: Shawn Jennings, male    DOB: 06-Jan-1946, 76 y.o.   MRN: [id number] ? ?HPI ? ?Shawn Jennings is a 76 y/o male with a history of CAD, DM, hyperlipidemia, HTN, CKD, thyroid disease, COPD, depression, GERD, PAF, PAD, current tobacco use and chronic heart failure.  ? ?Echo report from 08/03/21 reviewed and showed an EF of 20-25% along with mild Shawn.  ? ?RHC/LHC done 06/06/20 and showed: ?Prox Cx to Mid Cx lesion is 100% stenosed. ?Mid LAD lesion is 100% stenosed. ?Prox RCA lesion is 100% stenosed. ?LIMA graft was visualized by non-selective angiography and is normal in caliber. ?The graft exhibits no disease. ?RIMA graft was visualized by non-selective angiography and is normal in caliber. ?The graft exhibits no disease. ? ?Admitted 11/08/21 due to shortness of breath and chest pain. Cardiology consult obtained. Initially given IV heparin and IV lasix. Ruled out for NSTEMI. IV iron given for anemia. Discharged after 2 days.  ? ?He presents today with a chief complaint of a follow-up visit. He currently has no symptoms and specifically denies any fatigue, cough, shortness of breath, palpitations, chest pain, pedal edema, abdominal distention, dizziness, weight gain or weight gain.  ? ?Past Medical History:  ?Diagnosis Date  ? Aortic stenosis   ? a. Pt unaware of history but CT imaging 01/2019 consistent w/ AVR; b. 01/2019 Echo: Mild to mod AS. Mean grad 12mHg.  ? CAD (coronary artery disease)   ? a. 1994 [place] CABG ([place]; b. Reports multiple stress tests over the years w/o repeat cath; c. 01/2019 MV: large, sev, fixed inf and inflat defect extending to apex. No ischemia. EF 37%-->Med rx given atypical Ss and pt wishes.  ? CHF (congestive heart failure) ([facility]   ? CKD (chronic kidney disease), stage III (HL[place]   ? COPD (chronic obstructive pulmonary disease) (HC[facility]   ? Depression   ? Essential hypertension   ? GERD (gastroesophageal reflux disease)   ? Hyperlipidemia LDL goal <70   ? Ischemic cardiomyopathy   ? a.  01/2019 Echo: EF 40-45%, impaired relaxation. Mildly dil LA. Sev mitral annular Ca2+. Mild to mod AS.  ? PAD (peripheral artery disease) ([place]   ? a. 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  ? PAF (paroxysmal atrial fibrillation) (HEmanuel   ? a. CHA2DS2VASc = 4-->Xarelto '15mg'$  daily in setting of CKD.  ? Thyrotoxicosis   ? Tobacco abuse   ? Type II diabetes mellitus ([place]   ? ?Past Surgical History:  ?Procedure Laterality Date  ? CARDIAC CATHETERIZATION    ? CHOLECYSTECTOMY    ? COLONOSCOPY    ? COLONOSCOPY WITH PROPOFOL N/A 07/20/2021  ? Procedure: COLONOSCOPY WITH PROPOFOL;  Surgeon: VLin Landsman MD;  [facility];  Service: Gastroenterology;  Laterality: N/A;  ? CORONARY ARTERY BYPASS GRAFT    ? a. 1[place] ? ENDOSCOPIC RETROGRADE CHOLANGIOPANCREATOGRAPHY (ERCP) WITH PROPOFOL N/A 10/19/2020  ? Procedure: ENDOSCOPIC RETROGRADE CHOLANGIOPANCREATOGRAPHY (ERCP) WITH PROPOFOL;  Surgeon: WLucilla Lame MD;  Location: [facility] ENDOSCOPY;  Service: Endoscopy;  Laterality: N/A;  ? ERCP N/A 12/29/2020  ? Procedure: ENDOSCOPIC RETROGRADE CHOLANGIOPANCREATOGRAPHY (ERCP);  Surgeon: WLucilla Lame MD;  [facility];  Service: Endoscopy;  Laterality: N/A;  ? ESOPHAGOGASTRODUODENOSCOPY (EGD) WITH PROPOFOL N/A 07/18/2021  ? Procedure: ESOPHAGOGASTRODUODENOSCOPY (EGD) WITH PROPOFOL;  Surgeon: VLin Landsman MD;  [facility];  Service: Gastroenterology;  Laterality: N/A;  ? Left AKA    ? RIGHT/LEFT HEART CATH AND CORONARY ANGIOGRAPHY N/A 06/06/2020  ? Procedure: RIGHT/LEFT HEART  CATH AND CORONARY ANGIOGRAPHY;  Surgeon: Wellington Hampshire, MD;  Location: [place] CV LAB;  Service: Cardiovascular;  Laterality: N/A;  ? ?Family History  ?Problem Relation Age of Onset  ? Heart attack Mother   ?     died @ 34.  ? Other Father   ?     never knew his father.  ? Other Sister   ?     overdose of sleeping pills.  ? Heart disease Brother   ?     died in his 52's  ? Other Sister   ?     complication of abd surgery @  87  ? CAD Sister   ? ?Social History  ? ?Tobacco Use  ? Smoking status: Every Day  ?  Packs/day: 1.00  ?  Years: 62.00  ?  Pack years: 62.00  ?  Types: Cigarettes  ? Smokeless tobacco: Never  ?Substance Use Topics  ? Alcohol use: Never  ? ?No Known Allergies ? ?Prior to Admission medications   ?Medication Sig Start Date End Date Taking? Authorizing Provider  ?acetaminophen (TYLENOL) 500 MG tablet Take 500 mg by mouth every 6 (six) hours as needed for mild pain.   Yes [provider]  ?albuterol (VENTOLIN HFA) 108 (90 Base) MCG/ACT inhaler Inhale 2 puffs into the lungs every 6 (six) hours as needed for wheezing or shortness of breath.    Yes [provider]  ?apixaban (ELIQUIS) 5 MG TABS tablet Take 1 tablet (5 mg total) by mouth 2 (two) times daily. 07/22/21  Yes Wieting, Richard, MD  ?atorvastatin (LIPITOR) 80 MG tablet Take 80 mg by mouth at bedtime.   Yes [provider]  ?carvedilol (COREG) 3.125 MG tablet Take 1 tablet (3.125 mg total) by mouth 2 (two) times daily. 08/23/21  Yes Alisa Graff, FNP  ?cefdinir (OMNICEF) 300 MG capsule Take 300 mg by mouth 2 (two) times daily. 02/12/22  Yes [provider]  ?cholecalciferol (VITAMIN D3) 25 MCG (1000 UNIT) tablet Take 1,000 Units by mouth every evening.   Yes [provider]  ?dapagliflozin propanediol (FARXIGA) 10 MG TABS tablet Take 1 tablet (10 mg total) by mouth daily. 11/23/21  Yes Alisa Graff, FNP  ?DULoxetine (CYMBALTA) 30 MG capsule Take 30 mg by mouth 2 (two) times daily.   Yes [provider]  ?ferrous sulfate 325 (65 FE) MG tablet Take 325 mg by mouth every other day.   Yes [provider]  ?furosemide (LASIX) 20 MG tablet Take 1 tablet (20 mg total) by mouth daily as needed for fluid (shortness of breath). 11/10/21  Yes Jennye Boroughs, MD  ?furosemide (LASIX) 20 MG tablet Take 1 tablet (20 mg total) by mouth daily. 11/10/21  Yes Jennye Boroughs, MD  ?hydrOXYzine (ATARAX/VISTARIL) 25 MG tablet  Take 25 mg by mouth every 6 (six) hours as needed (allergy symptoms).   Yes [provider]  ?isosorbide mononitrate (IMDUR) 30 MG 24 hr tablet Take 1 tablet (30 mg total) by mouth daily. 03/11/20 05/27/29 Yes Sharen Hones, MD  ?lamoTRIgine (LAMICTAL) 25 MG tablet Take 25 mg by mouth 2 (two) times daily.   Yes [provider]  ?loratadine (CLARITIN) 10 MG tablet Take 10 mg by mouth daily.   Yes [provider]  ?Melatonin 3 MG TABS Take 5 mg by mouth at bedtime.   Yes [provider]  ?montelukast (SINGULAIR) 10 MG tablet Take 10 mg by mouth at bedtime.   Yes [provider]  ?  Multiple Vitamins-Minerals (PRESERVISION AREDS) CAPS Take 1 capsule by mouth in the morning and at bedtime.   Yes [provider]  ?nitroGLYCERIN (NITROSTAT) 0.4 MG SL tablet Place 0.4 mg under the tongue every 5 (five) minutes as needed for chest pain.   Yes [provider]  ?pantoprazole (PROTONIX) 40 MG tablet Take 40 mg by mouth daily.   Yes [provider]  ?potassium chloride (KLOR-CON) 10 MEQ tablet Take 1 tablet (10 mEq total) by mouth daily. 11/23/21 02/21/22 Yes Alisa Graff, FNP  ?sacubitril-valsartan (ENTRESTO) 24-26 MG Take 1 tablet by mouth 2 (two) times daily.   Yes [provider]  ?vitamin B-12 (CYANOCOBALAMIN) 1000 MCG tablet Take 1,000 mcg by mouth daily.   Yes [provider]  ? ?Review of Systems  ?Constitutional:  Negative for appetite change.  ?HENT:  Negative for congestion, postnasal drip and sore throat.   ?Eyes: Negative.   ?Respiratory:  Negative for cough and chest tightness.   ?Cardiovascular:  Negative for leg swelling.  ?Gastrointestinal:  Negative for abdominal distention.  ?Endocrine: Negative.   ?Genitourinary: Negative.   ?Musculoskeletal:  Negative for back pain and neck pain.  ?Skin: Negative.   ?Allergic/Immunologic: Negative.   ?Neurological:  Negative for dizziness and light-headedness.  ?Hematological:  Negative for  adenopathy. Does not bruise/bleed easily.  ?Psychiatric/Behavioral:  Negative for dysphoric mood and sleep disturbance (sleeping on 2 pillows). The patient is not nervous/anxious.   ? ?Vitals:  ? 04/26/2

## 2022-04-09 ENCOUNTER — Other Ambulatory Visit: Payer: Self-pay

## 2022-04-10 ENCOUNTER — Ambulatory Visit: Payer: Medicare Other | Admitting: Gastroenterology

## 2022-04-10 NOTE — Progress Notes (Deleted)
[name] MD, MRCP(U.K) [address]  [place]  [place], [place] [address]  Main: [phone]  Fax: [phone]   Primary Care Physician: [facility]  Primary Gastroenterologist:  Dr. [name]   No chief complaint on file.   HPI: [place] Duerst is a 76 y.o. male  Current Outpatient Medications  Medication Sig Dispense Refill   ACETAMINOPHEN EXTRA STRENGTH 500 MG capsule Take 1 capsule by mouth every 6 (six) hours as needed.     albuterol (VENTOLIN HFA) 108 (90 Base) MCG/ACT inhaler Inhale 2 puffs into the lungs every 6 (six) hours as needed for wheezing or shortness of breath.      apixaban (ELIQUIS) 5 MG TABS tablet Take 1 tablet (5 mg total) by mouth 2 (two) times daily. 60 tablet 0   atorvastatin (LIPITOR) 80 MG tablet Take 80 mg by mouth at bedtime.     carvedilol (COREG) 3.125 MG tablet Take 1 tablet (3.125 mg total) by mouth 2 (two) times daily. 60 tablet 3   cefdinir (OMNICEF) 300 MG capsule Take 300 mg by mouth 2 (two) times daily.     D 1000 25 MCG (1000 UT) capsule Take 1 capsule by mouth daily.     dapagliflozin propanediol (FARXIGA) 10 MG TABS tablet Take 1 tablet (10 mg total) by mouth daily. 30 tablet 5   DULoxetine (CYMBALTA) 30 MG capsule Take 30 mg by mouth 2 (two) times daily.     ferrous sulfate 325 (65 FE) MG tablet Take 325 mg by mouth every other day.     furosemide (LASIX) 20 MG tablet Take 1 tablet (20 mg total) by mouth daily as needed for fluid (shortness of breath). 30 tablet 0   furosemide (LASIX) 20 MG tablet Take 1 tablet (20 mg total) by mouth daily. 30 tablet 0   hydrOXYzine (ATARAX/VISTARIL) 25 MG tablet Take 25 mg by mouth every 6 (six) hours as needed (allergy symptoms).     isosorbide mononitrate (IMDUR) 30 MG 24 hr tablet Take 1 tablet (30 mg total) by mouth daily. 30 tablet 0   lamoTRIgine (LAMICTAL) 25 MG tablet Take 25 mg by mouth 2 (two) times daily.     loratadine (CLARITIN) 10 MG tablet Take 10 mg by mouth  daily.     Melatonin 3 MG TABS Take 5 mg by mouth at bedtime.     metolazone (ZAROXOLYN) 5 MG tablet Take 5 mg by mouth daily.     montelukast (SINGULAIR) 10 MG tablet Take 10 mg by mouth at bedtime.     Multiple Vitamins-Minerals (PRESERVISION AREDS) CAPS Take 1 capsule by mouth in the morning and at bedtime.     nitroGLYCERIN (NITROSTAT) 0.4 MG SL tablet Place 0.4 mg under the tongue every 5 (five) minutes as needed for chest pain.     pantoprazole (PROTONIX) 40 MG tablet Take 40 mg by mouth daily.     potassium chloride (KLOR-CON) 10 MEQ tablet Take 1 tablet (10 mEq total) by mouth daily. 90 tablet 3   sacubitril-valsartan (ENTRESTO) 24-26 MG Take 1 tablet by mouth 2 (two) times daily.     vitamin B-12 (CYANOCOBALAMIN) 1000 MCG tablet Take 1,000 mcg by mouth daily.     No current facility-administered medications for this visit.    Allergies as of 04/10/2022   (No Known Allergies)    ROS:  General: Negative for anorexia, weight loss, fever, chills, fatigue, weakness. ENT: Negative for hoarseness, difficulty swallowing , nasal congestion. CV: Negative for  chest pain, angina, palpitations, dyspnea on exertion, peripheral edema.  Respiratory: Negative for dyspnea at rest, dyspnea on exertion, cough, sputum, wheezing.  GI: See history of present illness. GU:  Negative for dysuria, hematuria, urinary incontinence, urinary frequency, nocturnal urination.  Endo: Negative for unusual weight change.    Physical Examination:   There were no vitals taken for this visit.  General: Well-nourished, well-developed in no acute distress.  Eyes: No icterus. Conjunctivae pink. Mouth: Oropharyngeal mucosa moist and pink , no lesions erythema or exudate. Lungs: Clear to auscultation bilaterally. Non-labored. Heart: Regular rate and rhythm, no murmurs rubs or gallops.  Abdomen: Bowel sounds are normal, nontender, nondistended, no hepatosplenomegaly or masses, no abdominal bruits or hernia , no  rebound or guarding.   Extremities: No lower extremity edema. No clubbing or deformities. Neuro: Alert and oriented x 3.  Grossly intact. Skin: Warm and dry, no jaundice.   Psych: Alert and cooperative, normal mood and affect.   Imaging Studies: No results found.  Assessment and Plan:   Wane Mollett is a 76 y.o. y/o male ***    Dr [name]  MD,MRCP [facility]) Follow up in ***

## 2022-05-16 ENCOUNTER — Ambulatory Visit: Payer: Medicare Other | Admitting: Family

## 2022-05-16 NOTE — Progress Notes (Deleted)
Patient ID: Shawn Jennings, male    DOB: Aug 03, 1946, 76 y.o.   MRN: [id number]  HPI  Shawn Jennings is a 76 y/o male with a history of CAD, DM, hyperlipidemia, HTN, CKD, thyroid disease, COPD, depression, GERD, PAF, PAD, current tobacco use and chronic heart failure.   Echo report from 08/03/21 reviewed and showed an EF of 20-25% along with mild Shawn.   RHC/LHC done 06/06/20 and showed: Prox Cx to Mid Cx lesion is 100% stenosed. Mid LAD lesion is 100% stenosed. Prox RCA lesion is 100% stenosed. LIMA graft was visualized by non-selective angiography and is normal in caliber. The graft exhibits no disease. RIMA graft was visualized by non-selective angiography and is normal in caliber. The graft exhibits no disease.  Has not been admitted or been in the ED in the last 6 months.   He presents today with a chief complaint of a follow-up visit.   Past Medical History:  Diagnosis Date   Aortic stenosis    a. Pt unaware of history but CT imaging 01/2019 consistent w/ AVR; b. 01/2019 Echo: Mild to mod AS. Mean grad 57mHg.   CAD (coronary artery disease)    a. 1994 s/p CABG ([place]; b. Reports multiple stress tests over the years w/o repeat cath; c. 01/2019 MV: large, sev, fixed inf and inflat defect extending to apex. No ischemia. EF 37%-->Med rx given atypical Ss and pt wishes.   CHF (congestive heart failure) (HCC)    CKD (chronic kidney disease), stage III (HCC)    COPD (chronic obstructive pulmonary disease) (HCC)    Depression    Essential hypertension    GERD (gastroesophageal reflux disease)    Hyperlipidemia LDL goal <70    Ischemic cardiomyopathy    a. 01/2019 Echo: EF 40-45%, impaired relaxation. Mildly dil LA. Sev mitral annular Ca2+. Mild to mod AS.   PAD (peripheral artery disease) ([place]    a. 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   PAF (paroxysmal atrial fibrillation) (HCC)    a. CHA2DS2VASc = 4-->Xarelto '15mg'$  daily in setting of CKD.   Thyrotoxicosis    Tobacco abuse    Type II diabetes  mellitus ([place]    Past Surgical History:  Procedure Laterality Date   CARDIAC CATHETERIZATION     CHOLECYSTECTOMY     COLONOSCOPY     COLONOSCOPY WITH PROPOFOL N/A 07/20/2021   Procedure: COLONOSCOPY WITH PROPOFOL;  Surgeon: VLin Landsman MD;  [facility];  Service: Gastroenterology;  Laterality: N/A;   CORONARY ARTERY BYPASS GRAFT     a. 1[place]  ENDOSCOPIC RETROGRADE CHOLANGIOPANCREATOGRAPHY (ERCP) WITH PROPOFOL N/A 10/19/2020   Procedure: ENDOSCOPIC RETROGRADE CHOLANGIOPANCREATOGRAPHY (ERCP) WITH PROPOFOL;  Surgeon: WLucilla Lame MD;  Location: [facility] ENDOSCOPY;  Service: Endoscopy;  Laterality: N/A;   ERCP N/A 12/29/2020   Procedure: ENDOSCOPIC RETROGRADE CHOLANGIOPANCREATOGRAPHY (ERCP);  Surgeon: WLucilla Lame MD;  [facility];  Service: Endoscopy;  Laterality: N/A;   ESOPHAGOGASTRODUODENOSCOPY (EGD) WITH PROPOFOL N/A 07/18/2021   Procedure: ESOPHAGOGASTRODUODENOSCOPY (EGD) WITH PROPOFOL;  Surgeon: VLin Landsman MD;  [facility];  Service: Gastroenterology;  Laterality: N/A;   Left AKA     RIGHT/LEFT HEART CATH AND CORONARY ANGIOGRAPHY N/A 06/06/2020   Procedure: RIGHT/LEFT HEART CATH AND CORONARY ANGIOGRAPHY;  Surgeon: AWellington Hampshire MD;  [facility];  Service: Cardiovascular;  Laterality: N/A;   Family History  Problem Relation Age of Onset   Heart attack Mother        died @  47.   Other Father        never knew his father.   Other Sister        overdose of sleeping pills.   Heart disease Brother        died in his 91's   Other Sister        complication of abd surgery @ 34   CAD Sister    Social History   Tobacco Use   Smoking status: Every Day    Packs/day: 1.00    Years: 62.00    Total pack years: 62.00    Types: Cigarettes   Smokeless tobacco: Never  Substance Use Topics   Alcohol use: Never   No Known Allergies   Review of Systems  Constitutional:  Negative for appetite  change.  HENT:  Negative for congestion, postnasal drip and sore throat.   Eyes: Negative.   Respiratory:  Negative for cough and chest tightness.   Cardiovascular:  Negative for leg swelling.  Gastrointestinal:  Negative for abdominal distention.  Endocrine: Negative.   Genitourinary: Negative.   Musculoskeletal:  Negative for back pain and neck pain.  Skin: Negative.   Allergic/Immunologic: Negative.   Neurological:  Negative for dizziness and light-headedness.  Hematological:  Negative for adenopathy. Does not bruise/bleed easily.  Psychiatric/Behavioral:  Negative for dysphoric mood and sleep disturbance (sleeping on 2 pillows). The patient is not nervous/anxious.       Physical Exam Vitals and nursing note reviewed.  Constitutional:      Appearance: Normal appearance.  HENT:     Head: Normocephalic and atraumatic.  Cardiovascular:     Rate and Rhythm: Normal rate. Rhythm irregular.  Pulmonary:     Effort: Pulmonary effort is normal. No respiratory distress.     Breath sounds: No wheezing or rales.  Abdominal:     General: There is no distension.     Palpations: Abdomen is soft.     Tenderness: There is no abdominal tenderness.  Musculoskeletal:        General: Deformity (left AKA) present.     Cervical back: Normal range of motion and neck supple.     Right lower leg: No edema.  Skin:    General: Skin is warm and dry.  Neurological:     General: No focal deficit present.     Mental Status: He is alert and oriented to person, place, and time.  Psychiatric:        Mood and Affect: Mood normal.        Behavior: Behavior normal.        Thought Content: Thought content normal.     Assessment & Plan:  1: Chronic heart failure with reduced ejection fraction- - NYHA class II - euvolemic today - getting weighed most days and weighs 135-136 pounds; reminded to call for an overnight weight gain of > 2 pounds or a weekly weight gain of >5 pounds - weight chart  reviewed - not adding salt but admits that he "loves" salt; encouraged him to continue to not add any - on GDMT of farxiga, carvedilol & entresto - consider MRA but unsure if labs could get done at week 1 and then week 4 unless we brought patient in for office visit - palliative care visit done 02/09/22 - BNP 08/03/21 was 1688.3  2: HTN- - BP  - [facility] - BMP 12/21/21 reviewed and showed sodium 139, potassium 4.0, creatinine 1.31 and GFR 56  3: DM- - A1c  07/15/21 was 6.5%  4: Atrial fibrillation- - cardiology Gilford Rile) 02/16/21 but NS on 09/01/21 - currently taking apixaban  5: Tobacco use- - smoking 5-6 cigarettes daily & has been smoking since the age of 40 - no desire to quit - denies alcohol or drug use   Facility medication list reviewed.

## 2022-05-30 ENCOUNTER — Ambulatory Visit (INDEPENDENT_AMBULATORY_CARE_PROVIDER_SITE_OTHER): Payer: Medicare Other | Admitting: Podiatry

## 2022-05-30 DIAGNOSIS — M79674 Pain in right toe(s): Secondary | ICD-10-CM

## 2022-05-30 DIAGNOSIS — I739 Peripheral vascular disease, unspecified: Secondary | ICD-10-CM | POA: Diagnosis not present

## 2022-05-30 DIAGNOSIS — M79675 Pain in left toe(s): Secondary | ICD-10-CM

## 2022-05-30 DIAGNOSIS — B351 Tinea unguium: Secondary | ICD-10-CM | POA: Diagnosis not present

## 2022-05-30 NOTE — Progress Notes (Signed)
  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 male presents with the above complaint. History confirmed with patient.  The nails are thickened and elongated and he is unable to cut them.  He is at [employer] and they are not able to cut them either.  He notes discoloration in his second toe on the right foot  Objective:  Physical Exam: His foot is fairly cool to touch and has a livedo reticularis type appearance and delayed capillary fill time.  The nails are thickened elongated x 5 with subungual debris and yellow discoloration Assessment:   1. PAD (peripheral artery disease) (HCC)   2. Pain due to onychomycosis of toenails of both feet      Plan:  Patient was evaluated and treated and all questions answered.  Discussed the etiology and treatment options for the condition in detail with the patient. Educated patient on the topical and oral treatment options for mycotic nails. Recommended debridement of the nails today. Sharp and mechanical debridement performed of all painful and mycotic nails today. Nails debrided in length and thickness using a nail nipper to level of comfort. Discussed treatment options including appropriate shoe gear. Follow up as needed for painful nails.   I discussed with him I suspect the discoloration is due to arterial disease.  He has not seen a vascular doctor recently and I do not see any record of this in his chart currently.  Referral was placed to [facility] to be evaluated.  I will see him back as needed for this he will follow-up in 3 months for at risk routine footcare.  Return in about 3 months (around 08/30/2022) for at risk diabetic foot care.

## 2022-06-01 ENCOUNTER — Telehealth (INDEPENDENT_AMBULATORY_CARE_PROVIDER_SITE_OTHER): Payer: Self-pay

## 2022-06-01 NOTE — Telephone Encounter (Signed)
Spoke with Shawn Jennings at [facility] - gave information for patient regarding the URGENT appt I made for him : Friday 06/08/22 at 7:30 [place] and follow up with Eulogio Ditch, NP.

## 2022-06-06 ENCOUNTER — Other Ambulatory Visit (INDEPENDENT_AMBULATORY_CARE_PROVIDER_SITE_OTHER): Payer: Self-pay | Admitting: Nurse Practitioner

## 2022-06-06 DIAGNOSIS — I739 Peripheral vascular disease, unspecified: Secondary | ICD-10-CM

## 2022-06-06 DIAGNOSIS — Z89512 Acquired absence of left leg below knee: Secondary | ICD-10-CM

## 2022-06-08 ENCOUNTER — Telehealth (INDEPENDENT_AMBULATORY_CARE_PROVIDER_SITE_OTHER): Payer: Self-pay

## 2022-06-08 ENCOUNTER — Encounter (INDEPENDENT_AMBULATORY_CARE_PROVIDER_SITE_OTHER): Payer: Medicare Other | Admitting: Nurse Practitioner

## 2022-06-08 ENCOUNTER — Encounter (INDEPENDENT_AMBULATORY_CARE_PROVIDER_SITE_OTHER): Payer: Medicare Other

## 2022-06-08 NOTE — Telephone Encounter (Signed)
[facility] as pt was supposed to be in the office this morning for ultrasound and visit with Eulogio Ditch, NP. [place] stated that he just walked past her going to breakfast. I scheduled pt and called [place] and spoke with Lacey when appt was scheduled. She states that they will call back to inform me of what is going on as she had just gotten to work and didn't know what was happening.

## 2022-06-11 ENCOUNTER — Ambulatory Visit: Payer: Medicare Other | Attending: Family | Admitting: Family

## 2022-06-11 ENCOUNTER — Other Ambulatory Visit: Payer: Self-pay | Admitting: Family

## 2022-06-11 ENCOUNTER — Encounter: Payer: Self-pay | Admitting: Family

## 2022-06-11 VITALS — BP 132/74 | HR 65 | Resp 16 | Ht 72.0 in

## 2022-06-11 DIAGNOSIS — E079 Disorder of thyroid, unspecified: Secondary | ICD-10-CM | POA: Insufficient documentation

## 2022-06-11 DIAGNOSIS — I200 Unstable angina: Secondary | ICD-10-CM

## 2022-06-11 DIAGNOSIS — E785 Hyperlipidemia, unspecified: Secondary | ICD-10-CM | POA: Insufficient documentation

## 2022-06-11 DIAGNOSIS — F32A Depression, unspecified: Secondary | ICD-10-CM | POA: Insufficient documentation

## 2022-06-11 DIAGNOSIS — I130 Hypertensive heart and chronic kidney disease with heart failure and stage 1 through stage 4 chronic kidney disease, or unspecified chronic kidney disease: Secondary | ICD-10-CM | POA: Insufficient documentation

## 2022-06-11 DIAGNOSIS — D649 Anemia, unspecified: Secondary | ICD-10-CM | POA: Insufficient documentation

## 2022-06-11 DIAGNOSIS — I5022 Chronic systolic (congestive) heart failure: Secondary | ICD-10-CM | POA: Diagnosis not present

## 2022-06-11 DIAGNOSIS — K219 Gastro-esophageal reflux disease without esophagitis: Secondary | ICD-10-CM | POA: Insufficient documentation

## 2022-06-11 DIAGNOSIS — Z7984 Long term (current) use of oral hypoglycemic drugs: Secondary | ICD-10-CM | POA: Insufficient documentation

## 2022-06-11 DIAGNOSIS — I5020 Unspecified systolic (congestive) heart failure: Secondary | ICD-10-CM | POA: Diagnosis not present

## 2022-06-11 DIAGNOSIS — Z7901 Long term (current) use of anticoagulants: Secondary | ICD-10-CM | POA: Insufficient documentation

## 2022-06-11 DIAGNOSIS — I10 Essential (primary) hypertension: Secondary | ICD-10-CM

## 2022-06-11 DIAGNOSIS — I480 Paroxysmal atrial fibrillation: Secondary | ICD-10-CM | POA: Diagnosis not present

## 2022-06-11 DIAGNOSIS — N1831 Chronic kidney disease, stage 3a: Secondary | ICD-10-CM

## 2022-06-11 DIAGNOSIS — R0989 Other specified symptoms and signs involving the circulatory and respiratory systems: Secondary | ICD-10-CM | POA: Diagnosis not present

## 2022-06-11 DIAGNOSIS — J449 Chronic obstructive pulmonary disease, unspecified: Secondary | ICD-10-CM | POA: Insufficient documentation

## 2022-06-11 DIAGNOSIS — F17210 Nicotine dependence, cigarettes, uncomplicated: Secondary | ICD-10-CM | POA: Insufficient documentation

## 2022-06-11 DIAGNOSIS — I2510 Atherosclerotic heart disease of native coronary artery without angina pectoris: Secondary | ICD-10-CM | POA: Diagnosis present

## 2022-06-11 DIAGNOSIS — E1122 Type 2 diabetes mellitus with diabetic chronic kidney disease: Secondary | ICD-10-CM

## 2022-06-11 DIAGNOSIS — Z720 Tobacco use: Secondary | ICD-10-CM

## 2022-06-11 MED ORDER — SPIRONOLACTONE 25 MG PO TABS: 25.0000 mg | ORAL_TABLET | Freq: Every day | ORAL | 3 refills | Status: AC

## 2022-06-11 NOTE — Progress Notes (Signed)
Patient ID: Shawn Jennings, male    DOB: 09-02-1946, 76 y.o.   MRN: [id number]  HPI  Mr Klem is a 76 y/o male with a history of CAD, DM, hyperlipidemia, HTN, CKD, thyroid disease, COPD, depression, GERD, PAF, PAD, current tobacco use and chronic heart failure.   Echo report from 08/03/21 reviewed and showed an EF of 20-25% along with mild MR.   RHC/LHC done 06/06/20 and showed: Prox Cx to Mid Cx lesion is 100% stenosed. Mid LAD lesion is 100% stenosed. Prox RCA lesion is 100% stenosed. LIMA graft was visualized by non-selective angiography and is normal in caliber. The graft exhibits no disease. RIMA graft was visualized by non-selective angiography and is normal in caliber. The graft exhibits no disease.  Admitted 11/08/21 due to shortness of breath and chest pain. Cardiology consult obtained. Initially given IV heparin and IV lasix. Ruled out for NSTEMI. IV iron given for anemia. Discharged after 2 days.   He presents today for a follow-up visit from [facility]. He has no complaints or issues that he would like to address during his visit. He says the facility manages all of his medications, low-sodium diet and weights. He currently has no symptoms and specifically denies any fatigue, dizziness, cough, shortness of breath, palpitations, chest pain/pressure, pedal edema, abdominal distention, constipation, or diarrhea. He is not wheezing but does have some rales in lower bilateral lung bases.   Past Medical History:  Diagnosis Date   Aortic stenosis    a. Pt unaware of history but CT imaging 01/2019 consistent w/ AVR; b. 01/2019 Echo: Mild to mod AS. Mean grad 54mHg.   CAD (coronary artery disease)    a. [place]; b. Reports multiple stress tests over the years w/o repeat cath; c. 01/2019 MV: large, sev, fixed inf and inflat defect extending to apex. No ischemia. EF 37%-->Med rx given atypical Ss and pt wishes.   CHF (congestive heart failure) (HCC)    CKD (chronic  kidney disease), stage III (HCC)    COPD (chronic obstructive pulmonary disease) (HCC)    Depression    Essential hypertension    GERD (gastroesophageal reflux disease)    Hyperlipidemia LDL goal <70    Ischemic cardiomyopathy    a. 01/2019 Echo: EF 40-45%, impaired relaxation. Mildly dil LA. Sev mitral annular Ca2+. Mild to mod AS.   PAD (peripheral artery disease) ([facility]    a. 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   PAF (paroxysmal atrial fibrillation) (HCC)    a. CHA2DS2VASc = 4-->Xarelto '15mg'$  daily in setting of CKD.   Thyrotoxicosis    Tobacco abuse    Type II diabetes mellitus ([facility]    Past Surgical History:  Procedure Laterality Date   CARDIAC CATHETERIZATION     CHOLECYSTECTOMY     COLONOSCOPY     COLONOSCOPY WITH PROPOFOL N/A 07/20/2021   Procedure: COLONOSCOPY WITH PROPOFOL;  Surgeon: VLin Landsman MD;  [facility];  Service: Gastroenterology;  Laterality: N/A;   CORONARY ARTERY BYPASS GRAFT     a. 1[place]  ENDOSCOPIC RETROGRADE CHOLANGIOPANCREATOGRAPHY (ERCP) WITH PROPOFOL N/A 10/19/2020   Procedure: ENDOSCOPIC RETROGRADE CHOLANGIOPANCREATOGRAPHY (ERCP) WITH PROPOFOL;  Surgeon: WLucilla Lame MD;  Location: [facility] ENDOSCOPY;  Service: Endoscopy;  Laterality: N/A;   ERCP N/A 12/29/2020   Procedure: ENDOSCOPIC RETROGRADE CHOLANGIOPANCREATOGRAPHY (ERCP);  Surgeon: WLucilla Lame MD;  [facility];  Service: Endoscopy;  Laterality: N/A;   ESOPHAGOGASTRODUODENOSCOPY (EGD) WITH PROPOFOL N/A 07/18/2021   Procedure: ESOPHAGOGASTRODUODENOSCOPY (EGD)  WITH PROPOFOL;  Surgeon: Lin Landsman, MD;  Location: [facility];  Service: Gastroenterology;  Laterality: N/A;   Left AKA     RIGHT/LEFT HEART CATH AND CORONARY ANGIOGRAPHY N/A 06/06/2020   Procedure: RIGHT/LEFT HEART CATH AND CORONARY ANGIOGRAPHY;  Surgeon: Wellington Hampshire, MD;  Location: [place] CV LAB;  Service: Cardiovascular;  Laterality: N/A;   Family History  Problem Relation Age of Onset    Heart attack Mother        died @ 90.   Other Father        never knew his father.   Other Sister        overdose of sleeping pills.   Heart disease Brother        died in his 62's   Other Sister        complication of abd surgery @ 73   CAD Sister    Social History   Tobacco Use   Smoking status: Every Day    Packs/day: 1.00    Years: 62.00    Total pack years: 62.00    Types: Cigarettes   Smokeless tobacco: Never  Substance Use Topics   Alcohol use: Never   No Known Allergies  Prior to Admission medications   Medication Sig Start Date End Date Taking? Authorizing Provider  ACETAMINOPHEN EXTRA STRENGTH 500 MG capsule Take 1 capsule by mouth every 6 (six) hours as needed. 01/01/22  Yes [provider]  albuterol (VENTOLIN HFA) 108 (90 Base) MCG/ACT inhaler Inhale 2 puffs into the lungs every 6 (six) hours as needed for wheezing or shortness of breath.    Yes [provider]  apixaban (ELIQUIS) 5 MG TABS tablet Take 1 tablet (5 mg total) by mouth 2 (two) times daily. 07/22/21  Yes Wieting, Richard, MD  atorvastatin (LIPITOR) 80 MG tablet Take 80 mg by mouth at bedtime.   Yes [provider]  BIOFREEZE COOL THE PAIN 4 % GEL Apply 1 Application topically 3 (three) times daily as needed. Apply to right shoulder three times daily as needed for pain 02/26/22  Yes [provider]  carvedilol (COREG) 3.125 MG tablet Take 1 tablet (3.125 mg total) by mouth 2 (two) times daily. 08/23/21  Yes Hackney, Otila Kluver A, FNP  D 1000 25 MCG (1000 UT) capsule Take 1 capsule by mouth daily. 10/25/21  Yes [provider]  dapagliflozin propanediol (FARXIGA) 10 MG TABS tablet Take 1 tablet (10 mg total) by mouth daily. 11/23/21  Yes Hackney, Otila Kluver A, FNP  DULoxetine (CYMBALTA) 30 MG capsule Take 30 mg by mouth 2 (two) times daily.   Yes [provider]  ferrous sulfate 325 (65 FE) MG tablet Take 325 mg by mouth every other day.   Yes [provider]   furosemide (LASIX) 20 MG tablet Take 1 tablet (20 mg total) by mouth daily as needed for fluid (shortness of breath). 11/10/21  Yes Jennye Boroughs, MD  furosemide (LASIX) 20 MG tablet Take 1 tablet (20 mg total) by mouth daily. 11/10/21  Yes Jennye Boroughs, MD  hydrOXYzine (ATARAX/VISTARIL) 25 MG tablet Take 25 mg by mouth every 6 (six) hours as needed (allergy symptoms).   Yes [provider]  isosorbide mononitrate (IMDUR) 30 MG 24 hr tablet Take 1 tablet (30 mg total) by mouth daily. 03/11/20 05/27/29 Yes Sharen Hones, MD  lamoTRIgine (LAMICTAL) 25 MG tablet Take 25 mg by mouth 2 (two) times daily.   Yes [provider]  loratadine (CLARITIN) 10 MG  tablet Take 10 mg by mouth daily.   Yes [provider]  Melatonin 3 MG TABS Take 5 mg by mouth at bedtime.   Yes [provider]  montelukast (SINGULAIR) 10 MG tablet Take 10 mg by mouth at bedtime.   Yes [provider]  Multiple Vitamins-Minerals (PRESERVISION AREDS) CAPS Take 1 capsule by mouth in the morning and at bedtime.   Yes [provider]  nitroGLYCERIN (NITROSTAT) 0.4 MG SL tablet Place 0.4 mg under the tongue every 5 (five) minutes as needed for chest pain.   Yes [provider]  pantoprazole (PROTONIX) 40 MG tablet Take 40 mg by mouth daily.   Yes [provider]  potassium chloride (KLOR-CON) 10 MEQ tablet Take 1 tablet (10 mEq total) by mouth daily. 11/23/21  Yes Hackney, Tina A, FNP  sacubitril-valsartan (ENTRESTO) 24-26 MG Take 1 tablet by mouth 2 (two) times daily.   Yes [provider]  vitamin B-12 (CYANOCOBALAMIN) 1000 MCG tablet Take 1,000 mcg by mouth daily.   Yes [provider]  cefdinir (OMNICEF) 300 MG capsule Take 300 mg by mouth 2 (two) times daily. 02/12/22   [provider]  metolazone (ZAROXOLYN) 5 MG tablet Take 5 mg by mouth daily. Patient not taking: Reported on 06/11/2022 12/04/21   [provider]    Review of  Systems  Constitutional:  Negative for appetite change, fatigue and unexpected weight change.  HENT:  Negative for congestion, postnasal drip and sore throat.   Eyes: Negative.   Respiratory:  Negative for cough and chest tightness.   Cardiovascular:  Negative for chest pain and leg swelling.  Gastrointestinal:  Negative for abdominal distention, constipation and diarrhea.  Endocrine: Negative.   Genitourinary: Negative.  Negative for difficulty urinating.  Musculoskeletal:  Negative for back pain and neck pain.  Skin: Negative.   Allergic/Immunologic: Negative.   Neurological:  Negative for dizziness and light-headedness.  Hematological:  Negative for adenopathy. Does not bruise/bleed easily.  Psychiatric/Behavioral:  Negative for dysphoric mood and sleep disturbance (sleeping on 2 pillows). The patient is not nervous/anxious.     There were no vitals filed for this visit.  Today's Vitals   06/11/22 0857 06/11/22 0858  BP: 132/74   Pulse: 65   Resp: 16   SpO2: 96%   Height: 6' (1.829 m)   PainSc: 0-No pain 0-No pain   Body mass index is 18.15 kg/m.    Lab Results  Component Value Date   CREATININE 1.31 (H) 12/21/2021   CREATININE 1.50 (H) 11/10/2021   CREATININE 1.49 (H) 11/09/2021    Physical Exam Vitals and nursing note reviewed.  Constitutional:      Appearance: Normal appearance.  HENT:     Head: Normocephalic and atraumatic.  Cardiovascular:     Rate and Rhythm: Normal rate and regular rhythm.  Pulmonary:     Effort: Pulmonary effort is normal. No respiratory distress.     Breath sounds: Rales present. No wheezing.  Abdominal:     General: There is no distension.     Palpations: Abdomen is soft.     Tenderness: There is no abdominal tenderness.  Musculoskeletal:        General: Deformity (left AKA) present.     Cervical back: Normal range of motion and neck supple.     Right lower leg: No edema.  Skin:    General: Skin is warm and dry.  Neurological:      General: No focal deficit present.  Mental Status: He is alert and oriented to person, place, and time.  Psychiatric:        Mood and Affect: Mood normal.        Behavior: Behavior normal.        Thought Content: Thought content normal.     Assessment & Plan:  1: Chronic heart failure with reduced ejection fraction- - NYHA class I - euvolemic today - getting weighed most days and weighs 135-136 pounds; reminded to call for an overnight weight gain of > 2 pounds or a weekly weight gain of >5 pounds - weight chart reviewed - not adding salt but admits that he "loves" salt; encouraged him to continue to not add any - on GDMT of farxiga, carvedilol & entresto - palliative care visit done 02/09/22 - BNP 11/08/21 was 2623.7 -Stop potassium today, start spirolactone '25mg'$  once daily and will report back in one week for labs BMP, and then 1 month for labs BMP.   2: HTN- - BP looks good (132/74);  - PCP is [facility] - BMP 12/21/21 reviewed and showed sodium 139, potassium 4.0, creatinine 1.31 and GFR 56  3: DM- - A1c 07/15/21 was 6.5%  4: Atrial fibrillation- - cardiology Gilford Rile) 02/16/21 but NS on 09/01/21 - currently taking apixaban  5: Tobacco use- - smoking 5-6 cigarettes daily & has been smoking since the age of 26 - no desire to quit - denies alcohol    Facility medication list reviewed.   Return in 1 month, sooner if needed.

## 2022-06-11 NOTE — Patient Instructions (Addendum)
Stop potassium today, start spirolactone '25mg'$  once daily and report back in one week for labs BMP, and then 1 month for labs BMP.   Follow-up 1 month

## 2022-06-19 ENCOUNTER — Other Ambulatory Visit: Payer: Medicare Other

## 2022-07-09 NOTE — Progress Notes (Deleted)
Patient ID: Shawn Jennings, male    DOB: Feb 16, 1946, 76 y.o.   MRN: [id number]  HPI  Shawn Jennings is Jennings 76 y/o male with Jennings history of CAD, DM, hyperlipidemia, HTN, CKD, thyroid disease, COPD, depression, GERD, PAF, PAD, current tobacco use and chronic heart failure.   Echo report from 08/03/21 reviewed and showed an EF of 20-25% along with mild Shawn.   RHC/LHC done 06/06/20 and showed: Prox Cx to Mid Cx lesion is 100% stenosed. Mid LAD lesion is 100% stenosed. Prox RCA lesion is 100% stenosed. LIMA graft was visualized by non-selective angiography and is normal in caliber. The graft exhibits no disease. RIMA graft was visualized by non-selective angiography and is normal in caliber. The graft exhibits no disease.  Admitted 11/08/21 due to shortness of breath and chest pain. Cardiology consult obtained. Initially given IV heparin and IV lasix. Ruled out for NSTEMI. IV iron given for anemia. Discharged after 2 days.   He presents today for Jennings follow-up visit from [facility] He has no complaints or issues that he would like to address during his visit. He says the facility manages all of his medications, low-sodium diet and weights. He currently has no symptoms and specifically denies any fatigue, dizziness, cough, shortness of breath, palpitations, chest pain/pressure, pedal edema, abdominal distention, constipation, or diarrhea. He is not wheezing but does have some rales in lower bilateral lung bases.   Past Medical History:  Diagnosis Date   Aortic stenosis    Jennings. Pt unaware of history but CT imaging 01/2019 consistent w/ AVR; b. 01/2019 Echo: Mild to mod AS. Mean grad 33mHg.   CAD (coronary artery disease)    Jennings. 1994 s/p CABG (Shawn Jennings; b. Reports multiple stress tests over the years w/o repeat cath; c. 01/2019 MV: large, sev, fixed inf and inflat defect extending to apex. No ischemia. EF 37%-->Med rx given atypical Ss and pt wishes.   CHF (congestive heart failure) (HCC)    CKD (chronic  kidney disease), stage III (HCC)    COPD (chronic obstructive pulmonary disease) (HCC)    Depression    Essential hypertension    GERD (gastroesophageal reflux disease)    Hyperlipidemia LDL goal <70    Ischemic cardiomyopathy    Jennings. 01/2019 Echo: EF 40-45%, impaired relaxation. Mildly dil LA. Sev mitral annular Ca2+. Mild to mod AS.   PAD (peripheral artery disease) ([place] [place]    Jennings. 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   PAF (paroxysmal atrial fibrillation) (HCC)    Jennings. CHA2DS2VASc = 4-->Xarelto '15mg'$  daily in setting of CKD.   Thyrotoxicosis    Tobacco abuse    Type II diabetes mellitus ([place]    Past Surgical History:  Procedure Laterality Date   CARDIAC CATHETERIZATION     CHOLECYSTECTOMY     COLONOSCOPY     COLONOSCOPY WITH PROPOFOL N/Jennings 07/20/2021   Procedure: COLONOSCOPY WITH PROPOFOL;  Surgeon: VLin Landsman Jennings;  [facility]  Service: Gastroenterology;  Laterality: N/Jennings;   CORONARY ARTERY BYPASS GRAFT     Jennings. [facility]  ENDOSCOPIC RETROGRADE CHOLANGIOPANCREATOGRAPHY (ERCP) WITH PROPOFOL N/Jennings 10/19/2020   Procedure: ENDOSCOPIC RETROGRADE CHOLANGIOPANCREATOGRAPHY (ERCP) WITH PROPOFOL;  Surgeon: WLucilla Lame Jennings;  Location: [facility] ENDOSCOPY;  Service: Endoscopy;  Laterality: N/Jennings;   ERCP N/Jennings 12/29/2020   Procedure: ENDOSCOPIC RETROGRADE CHOLANGIOPANCREATOGRAPHY (ERCP);  Surgeon: WLucilla Lame Jennings;  [facility]  Service: Endoscopy;  Laterality: N/Jennings;   ESOPHAGOGASTRODUODENOSCOPY (EGD) WITH PROPOFOL N/Jennings 07/18/2021   Procedure: ESOPHAGOGASTRODUODENOSCOPY (EGD)  WITH PROPOFOL;  Surgeon: Shawn Jennings;  Location: [facility] ENDOS[facility];  Service: Gastroenterology;  Laterality: N/Jennings;   Left AKA     RIGHT/LEFT HEART CATH AND CORONARY ANGIOGRAPHY N/Jennings 06/06/2020   Procedure: RIGHT/LEFT HEART CATH AND CORONARY ANGIOGRAPHY;  Surgeon: Shawn Hampshire, Jennings;  Location: [place] [facility];  Service: Cardiovascular;  Laterality: N/Jennings;   Family History  Problem Relation Age of Onset    Heart attack Mother        died @ 56.   Other Father        never knew his father.   Other Sister        overdose of sleeping pills.   Heart disease Brother        died in his 26's   Other Sister        complication of abd surgery @ 35   CAD Sister    Social History   Tobacco Use   Smoking status: Every Day    Packs/day: 1.00    Years: 62.00    Total pack years: 62.00    Types: Cigarettes   Smokeless tobacco: Never  Substance Use Topics   Alcohol use: Never   No Known Allergies  Prior to Admission medications   Medication Sig Start Date End Date Taking? Authorizing Provider  ACETAMINOPHEN EXTRA STRENGTH 500 MG capsule Take 1 capsule by mouth every 6 (six) hours as needed. 01/01/22  Yes Provider, Historical, Jennings  albuterol (VENTOLIN HFA) 108 (90 Base) MCG/ACT inhaler Inhale 2 puffs into the lungs every 6 (six) hours as needed for wheezing or shortness of breath.    Yes Provider, Historical, Jennings  apixaban (ELIQUIS) 5 MG TABS tablet Take 1 tablet (5 mg total) by mouth 2 (two) times daily. 07/22/21  Yes Wieting, Richard, Jennings  atorvastatin (LIPITOR) 80 MG tablet Take 80 mg by mouth at bedtime.   Yes Provider, Historical, Jennings  BIOFREEZE COOL THE PAIN 4 % GEL Apply 1 Application topically 3 (three) times daily as needed. Apply to right shoulder three times daily as needed for pain 02/26/22  Yes Provider, Historical, Jennings  carvedilol (COREG) 3.125 MG tablet Take 1 tablet (3.125 mg total) by mouth 2 (two) times daily. 08/23/21  Yes Hackney, Otila Kluver A, FNP  D 1000 25 MCG (1000 UT) capsule Take 1 capsule by mouth daily. 10/25/21  Yes Provider, Historical, Jennings  dapagliflozin propanediol (FARXIGA) 10 MG TABS tablet Take 1 tablet (10 mg total) by mouth daily. 11/23/21  Yes Hackney, Otila Kluver A, FNP  DULoxetine (CYMBALTA) 30 MG capsule Take 30 mg by mouth 2 (two) times daily.   Yes Provider, Historical, Jennings  ferrous sulfate 325 (65 FE) MG tablet Take 325 mg by mouth every other day.   Yes Provider, Historical, Jennings   furosemide (LASIX) 20 MG tablet Take 1 tablet (20 mg total) by mouth daily as needed for fluid (shortness of breath). 11/10/21  Yes Jennye Boroughs, Jennings  furosemide (LASIX) 20 MG tablet Take 1 tablet (20 mg total) by mouth daily. 11/10/21  Yes Jennye Boroughs, Jennings  hydrOXYzine (ATARAX/VISTARIL) 25 MG tablet Take 25 mg by mouth every 6 (six) hours as needed (allergy symptoms).   Yes Provider, Historical, Jennings  isosorbide mononitrate (IMDUR) 30 MG 24 hr tablet Take 1 tablet (30 mg total) by mouth daily. 03/11/20 05/27/29 Yes Sharen Hones, Jennings  lamoTRIgine (LAMICTAL) 25 MG tablet Take 25 mg by mouth 2 (two) times daily.   Yes Provider, Historical, Jennings  loratadine (CLARITIN) 10 MG  tablet Take 10 mg by mouth daily.   Yes Provider, Historical, Jennings  Melatonin 3 MG TABS Take 5 mg by mouth at bedtime.   Yes Provider, Historical, Jennings  montelukast (SINGULAIR) 10 MG tablet Take 10 mg by mouth at bedtime.   Yes Provider, Historical, Jennings  Multiple Vitamins-Minerals (PRESERVISION AREDS) CAPS Take 1 capsule by mouth in the morning and at bedtime.   Yes Provider, Historical, Jennings  nitroGLYCERIN (NITROSTAT) 0.4 MG SL tablet Place 0.4 mg under the tongue every 5 (five) minutes as needed for chest pain.   Yes Provider, Historical, Jennings  pantoprazole (PROTONIX) 40 MG tablet Take 40 mg by mouth daily.   Yes Provider, Historical, Jennings  potassium chloride (KLOR-CON) 10 MEQ tablet Take 1 tablet (10 mEq total) by mouth daily. 11/23/21  Yes Hackney, Tina A, FNP  sacubitril-valsartan (ENTRESTO) 24-26 MG Take 1 tablet by mouth 2 (two) times daily.   Yes Provider, Historical, Jennings  vitamin B-12 (CYANOCOBALAMIN) 1000 MCG tablet Take 1,000 mcg by mouth daily.   Yes Provider, Historical, Jennings  cefdinir (OMNICEF) 300 MG capsule Take 300 mg by mouth 2 (two) times daily. 02/12/22   Provider, Historical, Jennings  metolazone (ZAROXOLYN) 5 MG tablet Take 5 mg by mouth daily. Patient not taking: Reported on 06/11/2022 12/04/21   Provider, Historical, Jennings    Review of  Systems  Constitutional:  Negative for appetite change, fatigue and unexpected weight change.  HENT:  Negative for congestion, postnasal drip and sore throat.   Eyes: Negative.   Respiratory:  Negative for cough and chest tightness.   Cardiovascular:  Negative for chest pain and leg swelling.  Gastrointestinal:  Negative for abdominal distention, constipation and diarrhea.  Endocrine: Negative.   Genitourinary: Negative.  Negative for difficulty urinating.  Musculoskeletal:  Negative for back pain and neck pain.  Skin: Negative.   Allergic/Immunologic: Negative.   Neurological:  Negative for dizziness and light-headedness.  Hematological:  Negative for adenopathy. Does not bruise/bleed easily.  Psychiatric/Behavioral:  Negative for dysphoric mood and sleep disturbance (sleeping on 2 pillows). The patient is not nervous/anxious.     There were no vitals filed for this visit.  There were no vitals filed for this visit.  There is no height or weight on file to calculate BMI.    Lab Results  Component Value Date   CREATININE 1.31 (H) 12/21/2021   CREATININE 1.50 (H) 11/10/2021   CREATININE 1.49 (H) 11/09/2021    Physical Exam Vitals and nursing note reviewed.  Constitutional:      Appearance: Normal appearance.  HENT:     Head: Normocephalic and atraumatic.  Cardiovascular:     Rate and Rhythm: Normal rate and regular rhythm.  Pulmonary:     Effort: Pulmonary effort is normal. No respiratory distress.     Breath sounds: Rales present. No wheezing.  Abdominal:     General: There is no distension.     Palpations: Abdomen is soft.     Tenderness: There is no abdominal tenderness.  Musculoskeletal:        General: Deformity (left AKA) present.     Cervical back: Normal range of motion and neck supple.     Right lower leg: No edema.  Skin:    General: Skin is warm and dry.  Neurological:     General: No focal deficit present.     Mental Status: He is alert and oriented to  person, place, and time.  Psychiatric:        Mood  and Affect: Mood normal.        Behavior: Behavior normal.        Thought Content: Thought content normal.     Assessment & Plan:  1: Chronic heart failure with reduced ejection fraction- - NYHA class I - euvolemic today - getting weighed most days and weighs 135-136 pounds; reminded to call for an overnight weight gain of > 2 pounds or Jennings weekly weight gain of >5 pounds - weight chart reviewed - not adding salt but admits that he "loves" salt; encouraged him to continue to not add any - on GDMT of farxiga, carvedilol & entresto - palliative care visit done 02/09/22 - BNP 11/08/21 was 2623.7 -Stop potassium today, start spirolactone '25mg'$  once daily and will report back in one week for labs BMP, and then 1 month for labs BMP.   2: HTN- - BP looks good ();  - PCP is [facility] - BMP 12/21/21 reviewed and showed sodium 139, potassium 4.0, creatinine 1.31 and GFR 56  3: DM- - A1c 07/15/21 was 6.5%  4: Atrial fibrillation- - cardiology Gilford Rile) 02/16/21 but NS on 09/01/21 - currently taking apixaban  5: Tobacco use- - smoking 5-6 cigarettes daily & has been smoking since the age of 55 - no desire to quit - denies alcohol    Facility medication list reviewed.   Return in  month, sooner if needed.

## 2022-07-09 NOTE — Progress Notes (Deleted)
Cardiology Office Note    Date:  07/09/2022   ID:  Shawn Jennings, DOB [birth year], MRN [id number]  PCP:  Pcp, No  Cardiologist:  [name], MD  Electrophysiologist:  None   Chief Complaint: Follow-up  History of Present Illness:   Shawn Jennings is a 76 y.o. male with history of  CAD status post CABG in 47 in [place], [place], chronic chest pain, HFrEF secondary to ICM, PAF, aortic stenosis status post bioprosthetic AVR, PAD status post left AKA, CKD stage III, DM2, HTN, HLD, COPD, tobacco use, GERD, and depression who presents for follow-up of CAD, cardiomyopathy, A-fib, and aortic stenosis.  Shawn Jennings has a long history of chest pain over the years.  He was admitted to [facility] in 01/2019 with chest pain.  Stress testing was potentially high risk with large severe fixed inferior and inferolateral defect extends to the apex.  No ischemia noted.  EF 37%.  Echo with EF 40 to 45%.  He preferred to avoid cardiac cath and he had been medically managed since.  He was admitted in 02/2020 with recurrent atypical chest pain that was worse with deep breathing.  Troponins were normal.  He was seen by our team with recommendation for follow-up echo which was not performed.  He underwent barium esophagram that showed small hiatal hernia.  Subsequent recommended echo was obtained in 03/2020 and demonstrated a newly reduced LV systolic function with an EF of 25 to 30%, global hypokinesis, mild LVH, grade 1 diastolic dysfunction, normal RV systolic function and ventricular cavity size, bioprosthetic aortic valve with normal function, and borderline dilatation of the aortic root measuring 38 mm.  Subsequent R/LHC in 06/06/20 showed severe underlying coronary disease with patent grafts and RHC with normal filling pressure.  He was admitted in 09/2020 with choledocholithiasis and underwent ERCP.  He was seen in 10/2020 in the ED with chest pain.  He was admitted in 06/2021 with an upper GI bleed requiring 2 units of  packed red blood cells.  EGD demonstrated erosive gastropathy with no acute bleeding.  Colonoscopy showed nonbleeding hemorrhoids.  GI did clear the patient to resume Eliquis.  He was admitted in 07/2021 with COVID and acute on chronic HFrEF.  He was diuresed with symptomatic improvement.  He was most recently admitted to the hospital in 10/2021 with chronic chest pain and acute on chronic HFrEF with symptomatic improvement with diuresis.  High-sensitivity troponin peaked at 283 and was felt to be consistent with demand ischemia.  Ischemic testing was deferred given improvement in symptoms with diuresis.  Since his hospital discharge, he has been followed by the [facility] CHF clinic.  ***   Labs independently reviewed: 12/2021 - potassium 4.0, BUN 26, serum creatinine 1.31 10/2021 - magnesium 2.2, Hgb 9.8, PLT 247 07/2021 - TSH normal, albumin 3.2, AST/ALT not elevated 06/2021 - A1c 6.5 01/2019 - TC 143, TG 152, HDL 51, LDL 62  Past Medical History:  Diagnosis Date   Aortic stenosis    a. Pt unaware of history but CT imaging 01/2019 consistent w/ AVR; b. 01/2019 Echo: Mild to mod AS. Mean grad 87mHg.   CAD (coronary artery disease)    a. [place] b. Reports multiple stress tests over the years w/o repeat cath; c. 01/2019 MV: large, sev, fixed inf and inflat defect extending to apex. No ischemia. EF 37%-->Med rx given atypical Ss and pt wishes.   CHF (congestive heart failure) (HCC)    CKD (chronic kidney disease),  stage III (HCC)    COPD (chronic obstructive pulmonary disease) (HCC)    Depression    Essential hypertension    GERD (gastroesophageal reflux disease)    Hyperlipidemia LDL goal <70    Ischemic cardiomyopathy    a. 01/2019 Echo: EF 40-45%, impaired relaxation. Mildly dil LA. Sev mitral annular Ca2+. Mild to mod AS.   PAD (peripheral artery disease) ([place])    a. 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   PAF (paroxysmal atrial fibrillation) (HCC)    a. CHA2DS2VASc = 4-->Xarelto '15mg'$  daily  in setting of CKD.   Thyrotoxicosis    Tobacco abuse    Type II diabetes mellitus ([place])     Past Surgical History:  Procedure Laterality Date   CARDIAC CATHETERIZATION     CHOLECYSTECTOMY     COLONOSCOPY     COLONOSCOPY WITH PROPOFOL N/A 07/20/2021   Procedure: COLONOSCOPY WITH PROPOFOL;  Surgeon: Lin Landsman, MD;  Location: [facility] ENDOSCOPY;  Service: Gastroenterology;  Laterality: N/A;   CORONARY ARTERY BYPASS GRAFT     a. [place]   ENDOSCOPIC RETROGRADE CHOLANGIOPANCREATOGRAPHY (ERCP) WITH PROPOFOL N/A 10/19/2020   Procedure: ENDOSCOPIC RETROGRADE CHOLANGIOPANCREATOGRAPHY (ERCP) WITH PROPOFOL;  Surgeon: [name], MD;  Location: [facility] ENDOSCOPY;  Service: Endoscopy;  Laterality: N/A;   ERCP N/A 12/29/2020   Procedure: ENDOSCOPIC RETROGRADE CHOLANGIOPANCREATOGRAPHY (ERCP);  Surgeon: [name], MD;  Location: [facility] ENDOSCOPY;  Service: Endoscopy;  Laterality: N/A;   ESOPHAGOGASTRODUODENOSCOPY (EGD) WITH PROPOFOL N/A 07/18/2021   Procedure: ESOPHAGOGASTRODUODENOSCOPY (EGD) WITH PROPOFOL;  Surgeon: Lin Landsman, MD;  Location: [facility] ENDOSCOPY;  Service: Gastroenterology;  Laterality: N/A;   Left AKA     RIGHT/LEFT HEART CATH AND CORONARY ANGIOGRAPHY N/A 06/06/2020   Procedure: RIGHT/LEFT HEART CATH AND CORONARY ANGIOGRAPHY;  Surgeon: Wellington Hampshire, MD;  Location: [place] CV LAB;  Service: Cardiovascular;  Laterality: N/A;    Current Medications: No outpatient medications have been marked as taking for the 07/11/22 encounter (Appointment) with Rise Mu, PA-C.    Allergies:   Patient has no known allergies.   Social History   Socioeconomic History   Marital status: Single    Spouse name: Not on file   Number of children: Not on file   Years of education: Not on file   Highest education level: Not on file  Occupational History   Not on file  Tobacco Use   Smoking status: Every Day    Packs/day: 1.00    Years: 62.00    Total pack years:  62.00    Types: Cigarettes   Smokeless tobacco: Never  Vaping Use   Vaping Use: Never used  Substance and Sexual Activity   Alcohol use: Never   Drug use: Never   Sexual activity: Not on file  Other Topics Concern   Not on file  Social History Narrative   Retired from [employer].  From [place], [place]. Moved to [place] ~ 2011 (pt initially said that he moved here in Feb 2020, but then said 9 yrs ago).   Social Determinants of Health   Financial Resource Strain: Not on file  Food Insecurity: Not on file  Transportation Needs: Not on file  Physical Activity: Not on file  Stress: Not on file  Social Connections: Not on file     Family History:  The patient's family history includes CAD in his sister; Heart attack in his mother; Heart disease in his brother; Other in his father, sister, and sister.  ROS:   ROS  EKGs/Labs/Other Studies Reviewed:    Studies reviewed were summarized above. The additional studies were reviewed today:  2D echo 08/03/2021: 1. Left ventricular ejection fraction, by estimation, is 20 to 25%. The  left ventricle has severely decreased function. The left ventricle  demonstrates severe global hypokinesis, anterior wall motion best  preserved. The left ventricular internal cavity  size was moderately dilated.   2. Right ventricular systolic function is normal. The right ventricular  size is normal.   3. Left atrial size was moderately dilated.   4. The mitral valve is normal in structure. Mild mitral valve  regurgitation.   5. The aortic valve was not well visualized. __________  2D echo 10/19/2020 [facility]): 1. Left ventricular ejection fraction, by estimation, is 50 to 55%. The  left ventricle has low normal function. The left ventricle has no regional  wall motion abnormalities. The left ventricular internal cavity size was  mildly dilated. Left ventricular  diastolic parameters were normal.   2. Right ventricular systolic function is normal. The  right ventricular  size is mildly enlarged.   3. Left atrial size was mildly dilated.   4. Right atrial size was mildly dilated.   5. The mitral valve was not well visualized. Trivial mitral valve  regurgitation.   6. The aortic valve was not well visualized. Aortic valve regurgitation  is trivial. Mild aortic valve stenosis. __________  [facility] 06/06/2020: Prox Cx to Mid Cx lesion is 100% stenosed. Mid LAD lesion is 100% stenosed. Prox RCA lesion is 100% stenosed. LIMA graft was visualized by non-selective angiography and is normal in caliber. The graft exhibits no disease. RIMA graft was visualized by non-selective angiography and is normal in caliber. The graft exhibits no disease.   1.  Severe underlying three-vessel coronary artery disease with patent grafts including LIMA to LAD, SVG Y graft to second diagonal and OM 3 and RIMA to RCA. 2.  Left ventricular angiography was not performed.  EF was severely reduced by echo. 3.  Right heart catheterization showed normal filling pressures, minimal pulmonary hypertension and severely reduced cardiac output.   Recommendations: Continue medical therapy for chronic systolic heart failure. No need for any revascularization. Continue aggressive treatment of risk factors. Xarelto can be resumed tomorrow. __________  2D echo 05/05/2020: 1. Left ventricular ejection fraction, by estimation, is 25 to 30%. The  left ventricle has severely decreased function. The left ventricle  demonstrates global hypokinesis. There is mild left ventricular  hypertrophy. Left ventricular diastolic parameters   are consistent with Grade I diastolic dysfunction (impaired relaxation).   2. Right ventricular systolic function is normal. The right ventricular  size is normal.   3. The mitral valve is degenerative. No evidence of mitral valve  regurgitation.   4. The aortic valve has been repaired/replaced. Aortic valve  regurgitation is not visualized. There is  a a bioprosthetic valve present  in the aortic position. Echo findings are consistent with normal structure  and function of the aortic valve  prosthesis.   5. Aortic dilatation noted. There is borderline dilatation of the aortic  root measuring 38 mm.   Comparison(s): 01/26/19 TTE reported a 40% LVEF. __________  [facility] 01/27/2019: Abnormal, potentially high risk pharmacologic myocardial perfusion stress test. There is a large in size, severe, fixed inferior and inferolateral defect extending to the apex consistent with scar. No significant ischemia is identified, though sensititivity is reduced by the extent of prior infarct. The left ventricular ejection fraction is moderately to severely reduced (LVEF  20% by QGS and 37% by Siemens calculation). LVEF noted to be 40-45% on yesterday's echocardiogram. Attenuation correction CT is notable for post-CABG and AVR findings. The gallbladder is surgically absent. __________  2D echo 01/26/2019: 1. The left ventricle has mild-moderately reduced systolic function, with  an ejection fraction of 40-45%. The cavity size was mildly dilated. Left  ventricular diastolic Doppler parameters are consistent with impaired  relaxation. The study is not  adequate for wall motion abnormalities.   2. The right ventricle has normal systolic function. The cavity was  normal. There is no increase in right ventricular wall thickness.   3. Left atrial size was mildly dilated.   4. The mitral valve is degenerative. Moderate calcification of the mitral  valve leaflet. There is severe mitral annular calcification present.   5. The tricuspid valve is grossly normal.   6. The aortic valve is tricuspid. Moderate calcification of the aortic  valve. Aortic valve regurgitation was not assessed by color flow Doppler  mild-moderate stenosis of the aortic valve. Mean gradient of 13 mm Hg.   7. No pulmonic valve vegetation visualized.   EKG:  EKG is ordered today.  The  EKG ordered today demonstrates ***  Recent Labs: 08/03/2021: ALT 10 08/04/2021: TSH 0.428 11/08/2021: B Natriuretic Peptide 2,623.7 11/10/2021: Hemoglobin 9.8; Magnesium 2.2; Platelets 247 12/21/2021: BUN 26; Creatinine, Ser 1.31; Potassium 4.0; Sodium 139  Recent Lipid Panel    Component Value Date/Time   CHOL 143 01/27/2019 0237   TRIG 152 (H) 01/27/2019 0237   HDL 51 01/27/2019 0237   CHOLHDL 2.8 01/27/2019 0237   VLDL 30 01/27/2019 0237   LDLCALC 62 01/27/2019 0237    PHYSICAL EXAM:    VS:  There were no vitals taken for this visit.  BMI: There is no height or weight on file to calculate BMI.  Physical Exam  Wt Readings from Last 3 Encounters:  11/10/21 133 lb 13.1 oz (60.7 kg)  08/05/21 128 lb 12 oz (58.4 kg)  07/15/21 147 lb 11.3 oz (67 kg)     ASSESSMENT & PLAN:   CAD status post CABG without***angina:  HFrEF secondary to ICM:  PAF:  History of aortic stenosis status post bioprosthetic AVR:  PAD status post left AKA:  CKD stage III:  HTN: Blood pressure  HLD: LDL 62 in 01/2019.  Tobacco use:   {Are you ordering a CV Procedure (e.g. stress test, cath, DCCV, TEE, etc)?   Press F2        :[id number]}     Disposition: F/u with Dr. Fletcher Anon or an APP in ***.   Medication Adjustments/Labs and Tests Ordered: Current medicines are reviewed at length with the patient today.  Concerns regarding medicines are outlined above. Medication changes, Labs and Tests ordered today are summarized above and listed in the Patient Instructions accessible in Encounters.   Signed, Christell Faith, PA-C 07/09/2022 7:30 AM     [place],  [address] [phone]

## 2022-07-11 ENCOUNTER — Ambulatory Visit: Payer: Medicare Other | Attending: Physician Assistant | Admitting: Physician Assistant

## 2022-07-12 ENCOUNTER — Ambulatory Visit: Payer: Medicare Other | Admitting: Family

## 2022-07-12 ENCOUNTER — Telehealth: Payer: Self-pay | Admitting: Family

## 2022-07-12 NOTE — Telephone Encounter (Signed)
Patient did not show for his Heart Failure Clinic appointment on 07/12/22. Will attempt to reschedule.

## 2022-07-17 ENCOUNTER — Emergency Department: Payer: Medicare Other

## 2022-07-17 ENCOUNTER — Inpatient Hospital Stay
Admission: EM | Admit: 2022-07-17 | Discharge: 2022-07-19 | DRG: 246 | Disposition: A | Discharge status: Skilled Nursing Facility | Payer: Medicare Other | Attending: Internal Medicine | Admitting: Internal Medicine

## 2022-07-17 DIAGNOSIS — Z79899 Other long term (current) drug therapy: Secondary | ICD-10-CM

## 2022-07-17 DIAGNOSIS — Z89612 Acquired absence of left leg above knee: Secondary | ICD-10-CM | POA: Diagnosis not present

## 2022-07-17 DIAGNOSIS — I255 Ischemic cardiomyopathy: Secondary | ICD-10-CM | POA: Diagnosis present

## 2022-07-17 DIAGNOSIS — I130 Hypertensive heart and chronic kidney disease with heart failure and stage 1 through stage 4 chronic kidney disease, or unspecified chronic kidney disease: Secondary | ICD-10-CM | POA: Diagnosis present

## 2022-07-17 DIAGNOSIS — N179 Acute kidney failure, unspecified: Secondary | ICD-10-CM | POA: Diagnosis present

## 2022-07-17 DIAGNOSIS — K219 Gastro-esophageal reflux disease without esophagitis: Secondary | ICD-10-CM | POA: Diagnosis present

## 2022-07-17 DIAGNOSIS — I10 Essential (primary) hypertension: Secondary | ICD-10-CM | POA: Diagnosis present

## 2022-07-17 DIAGNOSIS — I2510 Atherosclerotic heart disease of native coronary artery without angina pectoris: Secondary | ICD-10-CM | POA: Diagnosis present

## 2022-07-17 DIAGNOSIS — I25810 Atherosclerosis of coronary artery bypass graft(s) without angina pectoris: Secondary | ICD-10-CM | POA: Diagnosis not present

## 2022-07-17 DIAGNOSIS — J690 Pneumonitis due to inhalation of food and vomit: Secondary | ICD-10-CM | POA: Diagnosis present

## 2022-07-17 DIAGNOSIS — J449 Chronic obstructive pulmonary disease, unspecified: Secondary | ICD-10-CM | POA: Diagnosis present

## 2022-07-17 DIAGNOSIS — E785 Hyperlipidemia, unspecified: Secondary | ICD-10-CM | POA: Diagnosis present

## 2022-07-17 DIAGNOSIS — Z8249 Family history of ischemic heart disease and other diseases of the circulatory system: Secondary | ICD-10-CM

## 2022-07-17 DIAGNOSIS — F17210 Nicotine dependence, cigarettes, uncomplicated: Secondary | ICD-10-CM | POA: Diagnosis present

## 2022-07-17 DIAGNOSIS — Z953 Presence of xenogenic heart valve: Secondary | ICD-10-CM

## 2022-07-17 DIAGNOSIS — E1122 Type 2 diabetes mellitus with diabetic chronic kidney disease: Secondary | ICD-10-CM | POA: Diagnosis present

## 2022-07-17 DIAGNOSIS — Z7984 Long term (current) use of oral hypoglycemic drugs: Secondary | ICD-10-CM

## 2022-07-17 DIAGNOSIS — I5020 Unspecified systolic (congestive) heart failure: Secondary | ICD-10-CM | POA: Diagnosis not present

## 2022-07-17 DIAGNOSIS — E1151 Type 2 diabetes mellitus with diabetic peripheral angiopathy without gangrene: Secondary | ICD-10-CM | POA: Diagnosis present

## 2022-07-17 DIAGNOSIS — I480 Paroxysmal atrial fibrillation: Secondary | ICD-10-CM | POA: Diagnosis present

## 2022-07-17 DIAGNOSIS — I214 Non-ST elevation (NSTEMI) myocardial infarction: Principal | ICD-10-CM | POA: Diagnosis present

## 2022-07-17 DIAGNOSIS — Z7901 Long term (current) use of anticoagulants: Secondary | ICD-10-CM

## 2022-07-17 DIAGNOSIS — I5022 Chronic systolic (congestive) heart failure: Secondary | ICD-10-CM | POA: Diagnosis present

## 2022-07-17 DIAGNOSIS — E876 Hypokalemia: Secondary | ICD-10-CM | POA: Diagnosis present

## 2022-07-17 DIAGNOSIS — I440 Atrioventricular block, first degree: Secondary | ICD-10-CM | POA: Diagnosis present

## 2022-07-17 DIAGNOSIS — N1831 Chronic kidney disease, stage 3a: Secondary | ICD-10-CM | POA: Diagnosis present

## 2022-07-17 LAB — CBC
HCT: 45.9 % (ref 39.0–52.0)
Hemoglobin: 14.2 g/dL (ref 13.0–17.0)
MCH: 25.1 pg — ABNORMAL LOW (ref 26.0–34.0)
MCHC: 30.9 g/dL (ref 30.0–36.0)
MCV: 81.2 fL (ref 80.0–100.0)
Platelets: 184 10*3/uL (ref 150–400)
RBC: 5.65 MIL/uL (ref 4.22–5.81)
RDW: 17.5 % — ABNORMAL HIGH (ref 11.5–15.5)
WBC: 14 10*3/uL — ABNORMAL HIGH (ref 4.0–10.5)
nRBC: 0 % (ref 0.0–0.2)

## 2022-07-17 LAB — D-DIMER, QUANTITATIVE: D-Dimer, Quant: 1.21 ug/mL-FEU — ABNORMAL HIGH (ref 0.00–0.50)

## 2022-07-17 LAB — HEPATIC FUNCTION PANEL
ALT: 13 U/L (ref 0–44)
AST: 20 U/L (ref 15–41)
Albumin: 3.4 g/dL — ABNORMAL LOW (ref 3.5–5.0)
Alkaline Phosphatase: 123 U/L (ref 38–126)
Bilirubin, Direct: 0.1 mg/dL (ref 0.0–0.2)
Indirect Bilirubin: 0.7 mg/dL (ref 0.3–0.9)
Total Bilirubin: 0.8 mg/dL (ref 0.3–1.2)
Total Protein: 6.6 g/dL (ref 6.5–8.1)

## 2022-07-17 LAB — BASIC METABOLIC PANEL
Anion gap: 10 (ref 5–15)
BUN: 24 mg/dL — ABNORMAL HIGH (ref 8–23)
CO2: 19 mmol/L — ABNORMAL LOW (ref 22–32)
Calcium: 8.9 mg/dL (ref 8.9–10.3)
Chloride: 113 mmol/L — ABNORMAL HIGH (ref 98–111)
Creatinine, Ser: 1.62 mg/dL — ABNORMAL HIGH (ref 0.61–1.24)
GFR, Estimated: 43 mL/min — ABNORMAL LOW (ref >60)
Glucose, Bld: 212 mg/dL — ABNORMAL HIGH (ref 70–99)
Potassium: 3.4 mmol/L — ABNORMAL LOW (ref 3.5–5.1)
Sodium: 142 mmol/L (ref 135–145)

## 2022-07-17 LAB — TROPONIN I (HIGH SENSITIVITY): Troponin I (High Sensitivity): 774 ng/L (ref <18)

## 2022-07-17 LAB — LIPASE, BLOOD: Lipase: 77 U/L — ABNORMAL HIGH (ref 11–51)

## 2022-07-17 MED ORDER — FUROSEMIDE 40 MG PO TABS
20.0000 mg | ORAL_TABLET | Freq: Every day | ORAL | Status: DC
  Administered 2022-07-18: 20 mg via ORAL
  Filled 2022-07-17: qty 1

## 2022-07-17 MED ORDER — METRONIDAZOLE 500 MG/100ML IV SOLN: 500.0000 mg | Freq: Three times a day (TID) | INTRAVENOUS | Status: DC

## 2022-07-17 MED ORDER — ACETAMINOPHEN 325 MG PO TABS: 650.0000 mg | ORAL_TABLET | ORAL | Status: DC | PRN

## 2022-07-17 MED ORDER — VITAMIN B-12 1000 MCG PO TABS
1000.0000 ug | ORAL_TABLET | Freq: Every day | ORAL | Status: DC
  Administered 2022-07-19: 1000 ug via ORAL
  Filled 2022-07-17 (×2): qty 1

## 2022-07-17 MED ORDER — SODIUM CHLORIDE 0.9 % IV SOLN
2.0000 g | Freq: Once | INTRAVENOUS | Status: DC
  Administered 2022-07-18: 2 g via INTRAVENOUS
  Filled 2022-07-17: qty 20

## 2022-07-17 MED ORDER — NITROGLYCERIN 0.4 MG SL SUBL
0.4000 mg | SUBLINGUAL_TABLET | SUBLINGUAL | Status: DC | PRN
  Administered 2022-07-18 (×2): 0.4 mg via SUBLINGUAL
  Filled 2022-07-17: qty 1

## 2022-07-17 MED ORDER — HYDROXYZINE HCL 25 MG PO TABS: 25.0000 mg | ORAL_TABLET | Freq: Four times a day (QID) | ORAL | Status: DC | PRN

## 2022-07-17 MED ORDER — LORATADINE 10 MG PO TABS
10.0000 mg | ORAL_TABLET | Freq: Every day | ORAL | Status: DC
  Administered 2022-07-18 – 2022-07-19 (×2): 10 mg via ORAL
  Filled 2022-07-17 (×2): qty 1

## 2022-07-17 MED ORDER — FENTANYL CITRATE PF 50 MCG/ML IJ SOSY
50.0000 ug | PREFILLED_SYRINGE | Freq: Once | INTRAMUSCULAR | Status: AC
  Administered 2022-07-17: 50 ug via INTRAVENOUS
  Filled 2022-07-17: qty 1

## 2022-07-17 MED ORDER — OCUVITE-LUTEIN PO CAPS
1.0000 | ORAL_CAPSULE | Freq: Every day | ORAL | Status: DC
  Administered 2022-07-18 – 2022-07-19 (×2): 1 via ORAL
  Filled 2022-07-17 (×2): qty 1

## 2022-07-17 MED ORDER — SACUBITRIL-VALSARTAN 24-26 MG PO TABS
1.0000 | ORAL_TABLET | Freq: Two times a day (BID) | ORAL | Status: DC
  Administered 2022-07-18 – 2022-07-19 (×3): 1 via ORAL
  Filled 2022-07-17 (×3): qty 1

## 2022-07-17 MED ORDER — MAGNESIUM HYDROXIDE 400 MG/5ML PO SUSP: 30.0000 mL | Freq: Every day | ORAL | Status: DC | PRN

## 2022-07-17 MED ORDER — ALPRAZOLAM 0.25 MG PO TABS: 0.2500 mg | ORAL_TABLET | Freq: Two times a day (BID) | ORAL | Status: DC | PRN

## 2022-07-17 MED ORDER — POTASSIUM CHLORIDE IN NACL 20-0.9 MEQ/L-% IV SOLN
INTRAVENOUS | Status: DC
  Filled 2022-07-17: qty 1000

## 2022-07-17 MED ORDER — CARVEDILOL 3.125 MG PO TABS
3.1250 mg | ORAL_TABLET | Freq: Two times a day (BID) | ORAL | Status: DC
  Administered 2022-07-18 – 2022-07-19 (×2): 3.125 mg via ORAL
  Filled 2022-07-17 (×3): qty 1

## 2022-07-17 MED ORDER — TRAZODONE HCL 50 MG PO TABS: 25.0000 mg | ORAL_TABLET | Freq: Every evening | ORAL | Status: DC | PRN

## 2022-07-17 MED ORDER — ONDANSETRON HCL 4 MG/2ML IJ SOLN: 4.0000 mg | Freq: Four times a day (QID) | INTRAMUSCULAR | Status: DC | PRN

## 2022-07-17 MED ORDER — ATORVASTATIN CALCIUM 80 MG PO TABS
80.0000 mg | ORAL_TABLET | Freq: Every day | ORAL | Status: DC
  Administered 2022-07-18: 80 mg via ORAL
  Filled 2022-07-17: qty 1

## 2022-07-17 MED ORDER — SPIRONOLACTONE 25 MG PO TABS
25.0000 mg | ORAL_TABLET | Freq: Every day | ORAL | Status: DC
  Administered 2022-07-18 – 2022-07-19 (×2): 25 mg via ORAL
  Filled 2022-07-17 (×2): qty 1

## 2022-07-17 MED ORDER — NITROGLYCERIN 0.4 MG SL SUBL: 0.4000 mg | SUBLINGUAL_TABLET | SUBLINGUAL | Status: DC | PRN

## 2022-07-17 MED ORDER — ASPIRIN 300 MG RE SUPP: 300.0000 mg | RECTAL | Status: DC

## 2022-07-17 MED ORDER — HEPARIN (PORCINE) 25000 UT/250ML-% IV SOLN
850.0000 [IU]/h | INTRAVENOUS | Status: DC
  Administered 2022-07-18: 750 [IU]/h via INTRAVENOUS
  Filled 2022-07-17: qty 250

## 2022-07-17 MED ORDER — IOHEXOL 350 MG/ML SOLN
75.0000 mL | Freq: Once | INTRAVENOUS | Status: AC | PRN
  Administered 2022-07-17: 75 mL via INTRAVENOUS

## 2022-07-17 MED ORDER — ASPIRIN 81 MG PO CHEW: 324.0000 mg | CHEWABLE_TABLET | ORAL | Status: DC

## 2022-07-17 MED ORDER — DULOXETINE HCL 30 MG PO CPEP
30.0000 mg | ORAL_CAPSULE | Freq: Two times a day (BID) | ORAL | Status: DC
  Administered 2022-07-18 – 2022-07-19 (×2): 30 mg via ORAL
  Filled 2022-07-17 (×3): qty 1

## 2022-07-17 MED ORDER — ONDANSETRON HCL 4 MG/2ML IJ SOLN
4.0000 mg | Freq: Once | INTRAMUSCULAR | Status: AC
  Administered 2022-07-17: 4 mg via INTRAVENOUS
  Filled 2022-07-17: qty 2

## 2022-07-17 MED ORDER — FERROUS SULFATE 325 (65 FE) MG PO TABS
325.0000 mg | ORAL_TABLET | ORAL | Status: DC
  Administered 2022-07-19: 325 mg via ORAL
  Filled 2022-07-17: qty 1

## 2022-07-17 MED ORDER — PANTOPRAZOLE SODIUM 40 MG PO TBEC
40.0000 mg | DELAYED_RELEASE_TABLET | Freq: Every day | ORAL | Status: DC
  Administered 2022-07-18 – 2022-07-19 (×2): 40 mg via ORAL
  Filled 2022-07-17 (×2): qty 1

## 2022-07-17 MED ORDER — LAMOTRIGINE 25 MG PO TABS
25.0000 mg | ORAL_TABLET | Freq: Two times a day (BID) | ORAL | Status: DC
  Administered 2022-07-18 – 2022-07-19 (×3): 25 mg via ORAL
  Filled 2022-07-17 (×3): qty 1

## 2022-07-17 MED ORDER — ALBUTEROL SULFATE (2.5 MG/3ML) 0.083% IN NEBU: 2.5000 mg | INHALATION_SOLUTION | Freq: Four times a day (QID) | RESPIRATORY_TRACT | Status: DC | PRN

## 2022-07-17 MED ORDER — ASPIRIN 81 MG PO TBEC
81.0000 mg | DELAYED_RELEASE_TABLET | Freq: Every day | ORAL | Status: DC
  Administered 2022-07-18 – 2022-07-19 (×2): 81 mg via ORAL
  Filled 2022-07-17 (×2): qty 1

## 2022-07-17 MED ORDER — MONTELUKAST SODIUM 10 MG PO TABS
10.0000 mg | ORAL_TABLET | Freq: Every day | ORAL | Status: DC
  Administered 2022-07-18: 10 mg via ORAL
  Filled 2022-07-17: qty 1

## 2022-07-17 MED ORDER — ISOSORBIDE MONONITRATE ER 30 MG PO TB24
30.0000 mg | ORAL_TABLET | Freq: Every day | ORAL | Status: DC
  Administered 2022-07-18 – 2022-07-19 (×2): 30 mg via ORAL
  Filled 2022-07-17 (×2): qty 1

## 2022-07-17 MED ORDER — VITAMIN D 25 MCG (1000 UNIT) PO TABS
1000.0000 [IU] | ORAL_TABLET | Freq: Every day | ORAL | Status: DC
  Administered 2022-07-18 – 2022-07-19 (×2): 1000 [IU] via ORAL
  Filled 2022-07-17 (×2): qty 1

## 2022-07-17 MED ORDER — DAPAGLIFLOZIN PROPANEDIOL 10 MG PO TABS
10.0000 mg | ORAL_TABLET | Freq: Every day | ORAL | Status: DC
  Administered 2022-07-18 – 2022-07-19 (×2): 10 mg via ORAL
  Filled 2022-07-17 (×2): qty 1

## 2022-07-17 MED ORDER — MELATONIN 5 MG PO TABS
5.0000 mg | ORAL_TABLET | Freq: Every day | ORAL | Status: DC
  Administered 2022-07-18: 5 mg via ORAL
  Filled 2022-07-17: qty 1

## 2022-07-17 NOTE — Progress Notes (Signed)
ANTICOAGULATION CONSULT NOTE - Initial Consult  Pharmacy Consult for Heparin  Indication: chest pain/ACS  No Known Allergies  Patient Measurements: Height: 6' (182.9 cm) Weight: 62.7 kg (138 lb 3.7 oz) IBW/kg (Calculated) : 77.6 Heparin Dosing Weight: 62.7 kg   Vital Signs: Temp: 97.9 F (36.6 C) (09/26 2149) Temp Source: Oral (09/26 2149) BP: 114/73 (09/26 2200) Pulse Rate: 80 (09/26 2200)  Labs: Recent Labs    07/17/22 2204  HGB 14.2  HCT 45.9  PLT 184  CREATININE 1.62*  TROPONINIHS 774*    Estimated Creatinine Clearance: 33.3 mL/min (A) (by C-G formula based on SCr of 1.62 mg/dL (H)).   Medical History: Past Medical History:  Diagnosis Date   Aortic stenosis    a. Pt unaware of history but CT imaging 01/2019 consistent w/ AVR; b. 01/2019 Echo: Mild to mod AS. Mean grad 34mHg.   CAD (coronary artery disease)    a. [place] b. Reports multiple stress tests over the years w/o repeat cath; c. 01/2019 MV: large, sev, fixed inf and inflat defect extending to apex. No ischemia. EF 37%-->Med rx given atypical Ss and pt wishes.   CHF (congestive heart failure) (HCC)    CKD (chronic kidney disease), stage III (HCC)    COPD (chronic obstructive pulmonary disease) (HCC)    Depression    Essential hypertension    GERD (gastroesophageal reflux disease)    Hyperlipidemia LDL goal <70    Ischemic cardiomyopathy    a. 01/2019 Echo: EF 40-45%, impaired relaxation. Mildly dil LA. Sev mitral annular Ca2+. Mild to mod AS.   PAD ([place]ipheral artery disease) ([place]    a. 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   PAF (paroxysmal atrial fibrillation) (HCC)    a. CHA2DS2VASc = 4-->Xarelto '15mg'$  daily in setting of CKD.   Thyrotoxicosis    Tobacco abuse    Type II diabetes mellitus (HCC)     Medications:  (Not in a hospital admission)   Assessment: Pharmacy consulted to dose heparin in this 76year old male admitted with ACS/NSTEMI.  Pt was on Eliquis 5 mg PO BID , last dose on 9/26 @  2019.   CrCl = 33.3 ml/min   Goal of Therapy:  INR 2-3 aPTT = 66 - 102  Monitor platelets by anticoagulation protocol: Yes   Plan:  Will start heparin drip @ 750 units/hr, no bolus b/c recent use of Eliquis.  Will use aPTT to guide dosing until HL and aPTT correlate. Will check aPTT and HL 8 hrs after start of drip.  Mayerly Kaman D 07/17/2022,11:30 PM

## 2022-07-17 NOTE — ED Provider Notes (Incomplete)
[facility] Provider Note    Event Date/Time   First MD Initiated Contact with Patient 07/17/22 2146     (approximate)   History   Chest Pain (Pt arrived via EMS from [facility] for c/o sudden onset substernal chest pain. Pt has hx of afib and quad cabbage. Received Nitro x3 from EMS, x1inch nitro paste and ASA. )   HPI  Shawn Jennings is a 76 y.o. male  here with severe chest pain. Pt reports that earlier tonight he had acute onset of severe, epigastric chest pain. Pain is sharp, tearing, radiates to hsi substernal area. No radiation to the back. He had nausea and an episode of emesis as well. Given ntiro x3 with ems, asa without relief. Pain is sharp, worse w/ inspiration. No known sick contacts. No suspicious food intake.       Physical Exam   Triage Vital Signs: ED Triage Vitals  Enc Vitals Group     BP 07/17/22 2149 122/69     Pulse Rate 07/17/22 2149 82     Resp 07/17/22 2149 (!) 23     Temp 07/17/22 2149 97.9 F (36.6 C)     Temp Source 07/17/22 2149 Oral     SpO2 07/17/22 2200 93 %     Weight 07/17/22 2150 138 lb 3.7 oz (62.7 kg)     Height 07/17/22 2150 6' (1.829 m)     Head Circumference --      Peak Flow --      Pain Score --      Pain Loc --      Pain Edu? --      Excl. in [place]? --     Most recent vital signs: Vitals:   07/17/22 2149 07/17/22 2200  BP: 122/69 114/73  Pulse: 82 80  Resp: (!) 23 (!) 26  Temp: 97.9 F (36.6 C)   SpO2:  93%     General: Awake, no distress.  CV:  Good peripheral perfusion.  Resp:  Normal effort. Mild tachypnea. Abd:  No distention. Mild epigastric TTP. No rebound or guarding. Other:  No Le swelling.   ED Results / Procedures / Treatments   Labs (all labs ordered are listed, but only abnormal results are displayed) Labs Reviewed  CBC - Abnormal; Notable for the following components:      Result Value   WBC 14.0 (*)    MCH 25.1 (*)    RDW 17.5 (*)    All other components within  normal limits  D-DIMER, QUANTITATIVE - Abnormal; Notable for the following components:   D-Dimer, Quant 1.21 (*)    All other components within normal limits  BASIC METABOLIC PANEL  HEPATIC FUNCTION PANEL  LIPASE, BLOOD  TROPONIN I (HIGH SENSITIVITY)     EKG Normal sinus rhythm, VR 83. PR 204, QRS 114, QTc 473. No acute ST elevations. PVC noted.   RADIOLOGY CXR: Clear    I also independently reviewed and agree with radiologist interpretations.   PROCEDURES:  Critical Care performed: No  .1-3 Lead EKG Interpretation  Performed by: Duffy Bruce, MD Authorized by: Duffy Bruce, MD     Interpretation: normal     ECG rate:  80-90   ECG rate assessment: normal     Rhythm: sinus rhythm     Ectopy: PVCs     Conduction: normal   Comments:     Indication: Chest pain     MEDICATIONS ORDERED IN ED: Medications - No data to  display   IMPRESSION / MDM / ASSESSMENT AND PLAN / ED COURSE  I reviewed the triage vital signs and the nursing notes.                               The patient is on the cardiac monitor to evaluate for evidence of arrhythmia and/or significant heart rate changes.   Ddx:  Differential includes the following, with pertinent life- or limb-threatening emergencies considered:  Atypical ACS, PE, dissection, gerd/gastritis, esophagitis, choledocholithiasis, obstruction  Patient's presentation is most consistent with acute presentation with potential threat to life or bodily function.  MDM:  76 yo m with h/o CHF, ACS   MEDICATIONS GIVEN IN ED: Medications - No data to display   Consults:  ***   EMR reviewed  ***     FINAL CLINICAL IMPRESSION(S) / ED DIAGNOSES   Final diagnoses:  None     Rx / DC Orders   ED Discharge Orders     None        Note:  This document was prepared using Dragon voice recognition software and may include unintentional dictation errors.

## 2022-07-17 NOTE — H&P (Incomplete)
[place]   PATIENT NAME: Shawn Jennings    MR#:  [id number]  DATE OF BIRTH:  03-16-1946  DATE OF ADMISSION:  07/17/2022  PRIMARY CARE PHYSICIAN: Pcp, No   Patient is coming from: Home  REQUESTING/REFERRING PHYSICIAN: Duffy Bruce, MD  CHIEF COMPLAINT:   Chief Complaint  Patient presents with  . Chest Pain    Pt arrived via EMS from [facility] for c/o sudden onset substernal chest pain. Pt has hx of afib and quad cabbage. Received Nitro x3 from EMS, x1inch nitro paste and ASA.     HISTORY OF PRESENT ILLNESS:  Shawn Jennings is a 76 y.o. Caucasian male with medical history significant for coronary artery disease status post CABG, CHF, stage III chronic kidney disease, COPD, depression, GERD, hypertension, dyslipidemia, ischemic Myopathy, paroxysmal atrial fibrillation on Eliquis, peripheral arterial disease and type 2 diabetes mellitus, who presented to the ER with acute onset of substernal chest pain  ED Course: When the patient came to the ER, respiratory rate was 23 and pulse oximetry was 91% on room air and later 93% with otherwise normal vital signs.  Labs reveal hypokalemia 3.4 and CO2 of 19 with blood glucose of 212 chloride 113.  BUN was 24 and creatinine 1.62 slightly above her previous levels.  Albumin was 3.4 with total protein of 6.6.  Serum lipase was 77.  High sensitive troponin was 774 and CBC showed leukocytosis of 14.  D-dimer was 1.21. EKG as reviewed by me : EKG showed normal sinus rhythm with a rate of 83 and multiple PVCs with borderline intraventricular conduction delay Imaging: Chest, abdominal and pelvic CT revealed no evidence for PE.  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  It also showed right lower lobe peribronchial wall thickening and plugging of bronchi and minimal airspace disease compatible with infectious/inflammatory process and could be associated  with aspiration.  It also showed mild cardiomegaly and aortic atherosclerosis.  The patient was given 50 kg of IV fentanyl, IV heparin, 2 g of IV Rocephin and IV Flagyl as well as 4 mg of IV Zofran.  He will be admitted to a progressive unit bed for further evaluation and management. PAST MEDICAL HISTORY:   Past Medical History:  Diagnosis Date  . Aortic stenosis    a. Pt unaware of history but CT imaging 01/2019 consistent w/ AVR; b. 01/2019 Echo: Mild to mod AS. Mean grad 55mHg.  .Marland KitchenCAD (coronary artery disease)    a. 1994 s/[place] CABG ([place]; b. Reports multiple stress tests over the years w/o repeat cath; c. 01/2019 MV: large, sev, fixed inf and inflat defect extending to apex. No ischemia. EF 37%-->Med rx given atypical Ss and pt wishes.  . CHF (congestive heart failure) ([place]   . CKD (chronic kidney disease), stage III ([place]   . COPD (chronic obstructive pulmonary disease) ([place]   . Depression   . Essential hypertension   . GERD (gastroesophageal reflux disease)   . Hyperlipidemia LDL goal <70   . Ischemic cardiomyopathy    a. 01/2019 Echo: EF 40-45%, impaired relaxation. Mildly dil LA. Sev mitral annular Ca2+. Mild to mod AS.  .Marland KitchenPAD (peripheral artery disease) ([place]    a. 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  .Marland KitchenPAF (paroxysmal atrial fibrillation) (HCC)    a. CHA2DS2VASc = 4-->Xarelto '15mg'$  daily in setting of CKD.  .Marland KitchenThyrotoxicosis   . Tobacco abuse   .  Type II diabetes mellitus ([place])     PAST SURGICAL HISTORY:   Past Surgical History:  Procedure Laterality Date  . CARDIAC CATHETERIZATION    . CHOLECYSTECTOMY    . COLONOSCOPY    . COLONOSCOPY WITH PROPOFOL N/A 07/20/2021   Procedure: COLONOSCOPY WITH PROPOFOL;  Surgeon: Lin Landsman, MD;  Location: [facility] ENDOSCOPY;  Service: Gastroenterology;  Laterality: N/A;  . CORONARY ARTERY BYPASS GRAFT     a. [place] CHOLANGIOPANCREATOGRAPHY (ERCP) WITH PROPOFOL N/A 10/19/2020   Procedure: ENDOSCOPIC RETROGRADE  CHOLANGIOPANCREATOGRAPHY (ERCP) WITH PROPOFOL;  Surgeon: [name], MD;  Location: [facility] ENDOSCOPY;  Service: Endoscopy;  Laterality: N/A;  . ERCP N/A 12/29/2020   Procedure: ENDOSCOPIC RETROGRADE CHOLANGIOPANCREATOGRAPHY (ERCP);  Surgeon: [name], MD;  Location: [facility] ENDOSCOPY;  Service: Endoscopy;  Laterality: N/A;  . ESOPHAGOGASTRODUODENOSCOPY (EGD) WITH PROPOFOL N/A 07/18/2021   Procedure: ESOPHAGOGASTRODUODENOSCOPY (EGD) WITH PROPOFOL;  Surgeon: Lin Landsman, MD;  Location: [facility] ENDOSCOPY;  Service: Gastroenterology;  Laterality: N/A;  . Left AKA    . RIGHT/LEFT HEART CATH AND CORONARY ANGIOGRAPHY N/A 06/06/2020   Procedure: RIGHT/LEFT HEART CATH AND CORONARY ANGIOGRAPHY;  Surgeon: Wellington Hampshire, MD;  Location: [place] CV LAB;  Service: Cardiovascular;  Laterality: N/A;    SOCIAL HISTORY:   Social History   Tobacco Use  . Smoking status: Every Day    Packs/day: 1.00    Years: 62.00    Total pack years: 62.00    Types: Cigarettes  . Smokeless tobacco: Never  Substance Use Topics  . Alcohol use: Never    FAMILY HISTORY:   Family History  Problem Relation Age of Onset  . Heart attack Mother        died @ 14.  . Other Father        never knew his father.  . Other Sister        overdose of sleeping pills.  . Heart disease Brother        died in his 27's  . Other Sister        complication of abd surgery @ 8  . CAD Sister     DRUG ALLERGIES:  No Known Allergies  REVIEW OF SYSTEMS:   ROS As per history of present illness. All pertinent systems were reviewed above. Constitutional, HEENT, cardiovascular, respiratory, GI, GU, musculoskeletal, neuro, psychiatric, endocrine, integumentary and hematologic systems were reviewed and are otherwise negative/unremarkable except for positive findings mentioned above in the HPI.   MEDICATIONS AT HOME:   Prior to Admission medications   Medication Sig Start Date End Date Taking? Authorizing Provider   ACETAMINOPHEN EXTRA STRENGTH 500 MG capsule Take 1 capsule by mouth every 6 (six) hours as needed. 01/01/22  Yes [provider]  apixaban (ELIQUIS) 5 MG TABS tablet Take 1 tablet (5 mg total) by mouth 2 (two) times daily. 07/22/21  Yes Wieting, Richard, MD  atorvastatin (LIPITOR) 80 MG tablet Take 80 mg by mouth at bedtime.   Yes [provider]  carvedilol (COREG) 3.125 MG tablet Take 1 tablet (3.125 mg total) by mouth 2 (two) times daily. 08/23/21  Yes Hackney, Otila Kluver A, FNP  D 1000 25 MCG (1000 UT) capsule Take 1 capsule by mouth daily. 10/25/21  Yes [provider]  dapagliflozin propanediol (FARXIGA) 10 MG TABS tablet Take 1 tablet (10 mg total) by mouth daily. 11/23/21  Yes Hackney, Otila Kluver A, FNP  DULoxetine (CYMBALTA) 30 MG capsule Take 30 mg by mouth 2 (two)  times daily.   Yes [provider]  ferrous sulfate 325 (65 FE) MG tablet Take 325 mg by mouth every other day.   Yes [provider]  isosorbide mononitrate (IMDUR) 30 MG 24 hr tablet Take 1 tablet (30 mg total) by mouth daily. 03/11/20 05/27/29 Yes Sharen Hones, MD  lamoTRIgine (LAMICTAL) 25 MG tablet Take 25 mg by mouth 2 (two) times daily.   Yes [provider]  loratadine (CLARITIN) 10 MG tablet Take 10 mg by mouth daily.   Yes [provider]  Melatonin 3 MG TABS Take 5 mg by mouth at bedtime.   Yes [provider]  montelukast (SINGULAIR) 10 MG tablet Take 10 mg by mouth at bedtime.   Yes [provider]  Multiple Vitamins-Minerals (PRESERVISION AREDS) CAPS Take 1 capsule by mouth in the morning and at bedtime.   Yes [provider]  nitroGLYCERIN (NITROSTAT) 0.4 MG SL tablet Place 0.4 mg under the tongue every 5 (five) minutes as needed for chest pain.   Yes [provider]  pantoprazole (PROTONIX) 40 MG tablet Take 40 mg by mouth daily.   Yes [provider]  sacubitril-valsartan (ENTRESTO) 24-26 MG Take 1 tablet by mouth 2 (two)  times daily.   Yes [provider]  spironolactone (ALDACTONE) 25 MG tablet Take 1 tablet (25 mg total) by mouth daily. 06/11/22  Yes Hackney, Otila Kluver A, FNP  vitamin B-12 (CYANOCOBALAMIN) 1000 MCG tablet Take 1,000 mcg by mouth daily.   Yes [provider]  albuterol (VENTOLIN HFA) 108 (90 Base) MCG/ACT inhaler Inhale 2 puffs into the lungs every 6 (six) hours as needed for wheezing or shortness of breath.     [provider]  BIOFREEZE COOL THE PAIN 4 % GEL Apply 1 Application topically 3 (three) times daily as needed. Apply to right shoulder three times daily as needed for pain 02/26/22   [provider]  furosemide (LASIX) 20 MG tablet Take 1 tablet (20 mg total) by mouth daily. 11/10/21   Jennye Boroughs, MD  hydrOXYzine (ATARAX/VISTARIL) 25 MG tablet Take 25 mg by mouth every 6 (six) hours as needed (allergy symptoms).    [provider]  potassium chloride (KLOR-CON) 10 MEQ tablet Take 1 tablet (10 mEq total) by mouth daily. Patient not taking: Reported on 07/17/2022 11/23/21   Alisa Graff, FNP      VITAL SIGNS:  Blood pressure 127/78, pulse 73, temperature 97.9 F (36.6 C), temperature source Oral, resp. rate (!) 25, height 6' (1.829 m), weight 62.7 kg, SpO2 97 %.  PHYSICAL EXAMINATION:  Physical Exam  GENERAL:  76 y.o.-year-old patient lying in the bed with no acute distress.  EYES: Pupils equal, round, reactive to light and accommodation. No scleral icterus. Extraocular muscles intact.  HEENT: Head atraumatic, normocephalic. Oropharynx and nasopharynx clear.  NECK:  Supple, no jugular venous distention. No thyroid enlargement, no tenderness.  LUNGS: Normal breath sounds bilaterally, no wheezing, rales,rhonchi or crepitation. No use of accessory muscles of respiration.  CARDIOVASCULAR: Regular rate and rhythm, S1, S2 normal. No murmurs, rubs, or gallops.  ABDOMEN: Soft, nondistended, nontender. Bowel sounds present. No organomegaly or mass.   EXTREMITIES: No pedal edema, cyanosis, or clubbing.  NEUROLOGIC: Cranial nerves II through XII are intact. Muscle strength 5/5 in all extremities. Sensation intact. Gait not checked.  PSYCHIATRIC: The patient is alert and oriented x 3.  Normal affect and good eye contact. SKIN: No obvious rash, lesion, or ulcer.   LABORATORY PANEL:  CBC Recent Labs  Lab 07/17/22 2204  WBC 14.0*  HGB 14.2  HCT 45.9  PLT 184   ------------------------------------------------------------------------------------------------------------------  Chemistries  Recent Labs  Lab 07/17/22 2204  NA 142  K 3.4*  CL 113*  CO2 19*  GLUCOSE 212*  BUN 24*  CREATININE 1.62*  CALCIUM 8.9  AST 20  ALT 13  ALKPHOS 123  BILITOT 0.8   ------------------------------------------------------------------------------------------------------------------  Cardiac Enzymes No results for input(s): "TROPONINI" in the last 168 hours. ------------------------------------------------------------------------------------------------------------------  RADIOLOGY:  CT ABDOMEN PELVIS W CONTRAST  Result Date: 07/17/2022 CLINICAL DATA:  Epigastric pain EXAM: CT ABDOMEN AND PELVIS WITH CONTRAST TECHNIQUE: Multidetector CT imaging of the abdomen and pelvis was performed using the standard protocol following bolus administration of intravenous contrast. RADIATION DOSE REDUCTION: This exam was performed according to the departmental dose-optimization program which includes automated exposure control, adjustment of the mA and/or kV according to patient size and/or use of iterative reconstruction technique. CONTRAST:  68m OMNIPAQUE IOHEXOL 350 MG/ML SOLN COMPARISON:  CT 07/15/2021, 10/17/2020 FINDINGS: Lower chest: Lung bases are degraded by motion. No consolidative process, see separately dictated chest CT. Aortic valve prosthesis. Hepatobiliary: No focal liver abnormality is seen. Status post cholecystectomy. No biliary dilatation.  Pancreas: Unremarkable. No pancreatic ductal dilatation or surrounding inflammatory changes. Spleen: Normal in size without focal abnormality. Adrenals/Urinary Tract: Adrenal glands are within normal limits. Kidneys show no hydronephrosis. Subcentimeter hypodense renal lesions too small to further characterize. Partially exophytic upper pole lesion on the right measuring 1.9 cm, contains slightly dense complex area, series 4, image 26. Mild wall thickening along the right bladder wall with slightly asymmetric mucosal enhancement, series 4, image 75. Stomach/Bowel: The stomach is nonenlarged. No dilated small bowel. No acute bowel wall thickening. Postsurgical changes in the left upper quadrant with chronically dilated segment of bowel in the region but no obstruction. Negative appendix Vascular/Lymphatic: Advanced aortic atherosclerosis. No aneurysm. Chronically occluded left SFA stent incompletely visualized. No suspicious lymph nodes. Reproductive: Prostate is unremarkable. Other: Negative for pelvic effusion or free air. Musculoskeletal: No acute osseous abnormality. IMPRESSION: 1. No CT evidence for acute intra-abdominal or pelvic abnormality. 2. 19 mm partially exophytic complex cystic lesion upper pole right kidney. When the patient is clinically stable and able to follow directions and hold their breath (preferably as an outpatient) further evaluation with dedicated abdominal MRI should be considered. 3. Mild wall thickening with slight asymmetric mucosal enhancement at the right posterior bladder wall, indeterminate for an area of infection or inflammation. 4. Other chronic findings as discussed above Electronically Signed   By: KDonavan FoilM.D.   On: 07/17/2022 23:16   CT Angio Chest PE W and/or Wo Contrast  Result Date: 07/17/2022 CLINICAL DATA:  Positive D-dimer with epigastric pain. EXAM: CT ANGIOGRAPHY CHEST WITH CONTRAST TECHNIQUE: Multidetector CT imaging of the chest was performed using the  standard protocol during bolus administration of intravenous contrast. Multiplanar CT image reconstructions and MIPs were obtained to evaluate the vascular anatomy. RADIATION DOSE REDUCTION: This exam was performed according to the departmental dose-optimization program which includes automated exposure control, adjustment of the mA and/or kV according to patient size and/or use of iterative reconstruction technique. CONTRAST:  736mOMNIPAQUE IOHEXOL 350 MG/ML SOLN COMPARISON:  CT angiogram chest 08/03/2021 FINDINGS: Cardiovascular: There is mild aneurysmal dilatation of the ascending aorta measuring 4.1 cm which is unchanged. There are atherosclerotic calcifications of the aorta. Aortic valve replacement is present. The heart is mildly enlarged. There is no pericardial effusion. There are atherosclerotic  calcifications of the aorta and coronary arteries. There is adequate opacification of the pulmonary arteries to the segmental level. No pulmonary embolism identified. Mediastinum/Nodes: No enlarged mediastinal, hilar, or axillary lymph nodes. Thyroid gland, trachea, and esophagus demonstrate no significant findings. Lungs/Pleura: There are minimal patchy airspace opacities in the right lower lobe. There is right lower lobe peribronchial wall thickening and plugging of bronchi similar to prior study. Right pleural effusion has resolved in the interval. There is minimal left lower lobe atelectasis. There is no evidence for pneumothorax. Upper Abdomen: Cholecystectomy clips are present. Musculoskeletal: No chest wall abnormality. No acute or significant osseous findings. Sternotomy wires are present. Review of the MIP images confirms the above findings. IMPRESSION: 1. No evidence for pulmonary embolism. 2. 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 Recommend annual imaging followup by CTA or MRA. This recommendation follows 2010 ACCF/AHA/AATS/ACR/ASA/SCA/SCAI/SIR/STS/SVM Guidelines for the  Diagnosis and Management of Patients with Thoracic Aortic Disease. Circulation. 2010; 121: H846-N629. Aortic aneurysm NOS (ICD10-I71.9) 3. Right lower lobe peribronchial wall thickening, plugging of bronchi and minimal airspace disease compatible with infectious/inflammatory process. Correlate clinically for aspiration. 4. Mild cardiomegaly. 5.  Aortic Atherosclerosis (ICD10-I70.0). Electronically Signed   By: Ronney Asters M.D.   On: 07/17/2022 23:13   DG Chest Port 1 View  Result Date: 07/17/2022 CLINICAL DATA:  Chest pain EXAM: PORTABLE CHEST 1 VIEW COMPARISON:  11/08/2021 FINDINGS: Prior CABG. Heart and mediastinal contours are within normal limits. No focal opacities or effusions. No acute bony abnormality. IMPRESSION: No active cardiopulmonary disease. Electronically Signed   By: Rolm Baptise M.D.   On: 07/17/2022 22:14      IMPRESSION AND PLAN:  Assessment and Plan: No notes have been filed under this hospital service. Service: Hospitalist      DVT prophylaxis: Lovenox***  Advanced Care Planning:  Code Status: full code***  Family Communication:  The plan of care was discussed in details with the patient (and family). I answered all questions. The patient agreed to proceed with the above mentioned plan. Further management will depend upon hospital course. Disposition Plan: Back to previous home environment Consults called: none***  All the records are reviewed and case discussed with ED provider.  Status is: Inpatient {Inpatient:23812}   At the time of the admission, it appears that the appropriate admission status for this patient is inpatient.  This is judged to be reasonable and necessary in order to provide the required intensity of service to ensure the patient's safety given the presenting symptoms, physical exam findings and initial radiographic and laboratory data in the context of comorbid conditions.  The patient requires inpatient status due to high intensity of service,  high risk of further deterioration and high frequency of surveillance required.  I certify that at the time of admission, it is my clinical judgment that the patient will require inpatient hospital care extending more than 2 midnights.                            Dispo: The patient is from: Home              Anticipated d/c is to: Home              Patient currently is not medically stable to d/c.              Difficult to place patient: No  Christel Mormon M.D on 07/17/2022 at 11:47 PM  [facility]  From 7 PM-7 AM, contact night-coverage [email or web address]  CC: Primary care physician; Pcp, No

## 2022-07-17 NOTE — ED Provider Notes (Signed)
[facility] Provider Note    Event Date/Time   First MD Initiated Contact with Patient 07/17/22 2146     (approximate)   History   Chest Pain (Pt arrived via EMS from [facility] for c/o sudden onset substernal chest pain. Pt has hx of afib and quad cabbage. Received Nitro x3 from EMS, x1inch nitro paste and ASA. )   HPI  Shawn Jennings is a 76 y.o. male  here with severe chest pain. Pt reports that earlier tonight he had acute onset of severe, epigastric chest pain. Pain is sharp, tearing, radiates to hsi substernal area. No radiation to the back. He had nausea and an episode of emesis as well. Given ntiro x3 with ems, asa without relief. Pain is sharp, worse w/ inspiration. No known sick contacts. No suspicious food intake.       Physical Exam   Triage Vital Signs: ED Triage Vitals  Enc Vitals Group     BP 07/17/22 2149 122/69     Pulse Rate 07/17/22 2149 82     Resp 07/17/22 2149 (!) 23     Temp 07/17/22 2149 97.9 F (36.6 C)     Temp Source 07/17/22 2149 Oral     SpO2 07/17/22 2200 93 %     Weight 07/17/22 2150 138 lb 3.7 oz (62.7 kg)     Height 07/17/22 2150 6' (1.829 m)     Head Circumference --      Peak Flow --      Pain Score --      Pain Loc --      Pain Edu? --      Excl. in [place]? --     Most recent vital signs: Vitals:   07/18/22 0000 07/18/22 0030  BP: (!) 95/59 123/66  Pulse: 64 70  Resp: 17 19  Temp:    SpO2: 95% 96%     General: Awake, no distress.  CV:  Good peripheral perfusion.  Resp:  Normal effort. Mild tachypnea. Abd:  No distention. Mild epigastric TTP. No rebound or guarding. Other:  No Le swelling.   ED Results / Procedures / Treatments   Labs (all labs ordered are listed, but only abnormal results are displayed) Labs Reviewed  BASIC METABOLIC PANEL - Abnormal; Notable for the following components:      Result Value   Potassium 3.4 (*)    Chloride 113 (*)    CO2 19 (*)    Glucose, Bld 212 (*)     BUN 24 (*)    Creatinine, Ser 1.62 (*)    GFR, Estimated 43 (*)    All other components within normal limits  CBC - Abnormal; Notable for the following components:   WBC 14.0 (*)    MCH 25.1 (*)    RDW 17.5 (*)    All other components within normal limits  HEPATIC FUNCTION PANEL - Abnormal; Notable for the following components:   Albumin 3.4 (*)    All other components within normal limits  LIPASE, BLOOD - Abnormal; Notable for the following components:   Lipase 77 (*)    All other components within normal limits  D-DIMER, QUANTITATIVE - Abnormal; Notable for the following components:   D-Dimer, Quant 1.21 (*)    All other components within normal limits  PROTIME-INR - Abnormal; Notable for the following components:   Prothrombin Time 15.7 (*)    INR 1.3 (*)    All other components within normal limits  TROPONIN  I (HIGH SENSITIVITY) - Abnormal; Notable for the following components:   Troponin I (High Sensitivity) 774 (*)    All other components within normal limits  TROPONIN I (HIGH SENSITIVITY) - Abnormal; Notable for the following components:   Troponin I (High Sensitivity) 1,889 (*)    All other components within normal limits  CULTURE, BLOOD (ROUTINE X 2)  CULTURE, BLOOD (ROUTINE X 2)  APTT  LIPOPROTEIN A (LPA)  HIV ANTIBODY (ROUTINE TESTING W REFLEX)  APTT  HEPARIN LEVEL (UNFRACTIONATED)     EKG Normal sinus rhythm, VR 83. PR 204, QRS 114, QTc 473. No acute ST elevations. PVC noted.   RADIOLOGY CXR: Clear    I also independently reviewed and agree with radiologist interpretations.   PROCEDURES:  Critical Care performed: No  .1-3 Lead EKG Interpretation  Performed by: Duffy Bruce, MD Authorized by: Duffy Bruce, MD     Interpretation: normal     ECG rate:  80-90   ECG rate assessment: normal     Rhythm: sinus rhythm     Ectopy: PVCs     Conduction: normal   Comments:     Indication: Chest pain     MEDICATIONS ORDERED IN ED: Medications   heparin ADULT infusion 100 units/mL (25000 units/252m) (750 Units/hr Intravenous New Bag/Given 07/18/22 0047)  atorvastatin (LIPITOR) tablet 80 mg (has no administration in time range)  carvedilol (COREG) tablet 3.125 mg (has no administration in time range)  furosemide (LASIX) tablet 20 mg (has no administration in time range)  isosorbide mononitrate (IMDUR) 24 hr tablet 30 mg (has no administration in time range)  nitroGLYCERIN (NITROSTAT) SL tablet 0.4 mg (has no administration in time range)  spironolactone (ALDACTONE) tablet 25 mg (has no administration in time range)  sacubitril-valsartan (ENTRESTO) 24-26 mg per tablet (has no administration in time range)  DULoxetine (CYMBALTA) DR capsule 30 mg (has no administration in time range)  hydrOXYzine (ATARAX) tablet 25 mg (has no administration in time range)  dapagliflozin propanediol (FARXIGA) tablet 10 mg (has no administration in time range)  pantoprazole (PROTONIX) EC tablet 40 mg (has no administration in time range)  ferrous sulfate tablet 325 mg (has no administration in time range)  cyanocobalamin (VITAMIN B12) tablet 1,000 mcg (has no administration in time range)  melatonin tablet 5 mg (has no administration in time range)  lamoTRIgine (LAMICTAL) tablet 25 mg (has no administration in time range)  cholecalciferol (VITAMIN D3) 25 MCG (1000 UNIT) tablet 1,000 Units (1,000 Units Oral Not Given 07/18/22 0012)  multivitamin-lutein (OCUVITE-LUTEIN) capsule 1 capsule (has no administration in time range)  albuterol (PROVENTIL) (2.5 MG/3ML) 0.083% nebulizer solution 2.5 mg (has no administration in time range)  loratadine (CLARITIN) tablet 10 mg (has no administration in time range)  montelukast (SINGULAIR) tablet 10 mg (has no administration in time range)  aspirin chewable tablet 324 mg (324 mg Oral Not Given 07/18/22 0011)    Or  aspirin suppository 300 mg ( Rectal See Alternative 07/18/22 0011)  aspirin EC tablet 81 mg (has no  administration in time range)  acetaminophen (TYLENOL) tablet 650 mg (has no administration in time range)  ondansetron (ZOFRAN) injection 4 mg (has no administration in time range)  0.9 % NaCl with KCl 20 mEq/ L  infusion (has no administration in time range)  traZODone (DESYREL) tablet 25 mg (has no administration in time range)  magnesium hydroxide (MILK OF MAGNESIA) suspension 30 mL (has no administration in time range)  ALPRAZolam (XANAX) tablet 0.25 mg (has no administration  in time range)  azithromycin (ZITHROMAX) 500 mg in sodium chloride 0.9 % 250 mL IVPB (500 mg Intravenous New Bag/Given 07/18/22 0116)  Ampicillin-Sulbactam (UNASYN) 3 g in sodium chloride 0.9 % 100 mL IVPB (0 g Intravenous Stopped 07/18/22 0047)  fentaNYL (SUBLIMAZE) injection 50 mcg (50 mcg Intravenous Given 07/17/22 2323)  ondansetron (ZOFRAN) injection 4 mg (4 mg Intravenous Given 07/17/22 2323)  iohexol (OMNIPAQUE) 350 MG/ML injection 75 mL (75 mLs Intravenous Contrast Given 07/17/22 2254)     IMPRESSION / MDM / ASSESSMENT AND PLAN / ED COURSE  I reviewed the triage vital signs and the nursing notes.                               The patient is on the cardiac monitor to evaluate for evidence of arrhythmia and/or significant heart rate changes.   Ddx:  Differential includes the following, with pertinent life- or limb-threatening emergencies considered:  Atypical ACS, PE, dissection, gerd/gastritis, esophagitis, choledocholithiasis, obstruction  Patient's presentation is most consistent with acute presentation with potential threat to life or bodily function.  MDM:  76 yo m with h/o CHF, ACS, here with acute chest pain. DDx broad, including ACS, PE, dissection, GERD. EKG nonischemic butr trop returned at >700. Was admitted in January with trop in 200s. No known recent cath. WIll start on heparin, plan to admit. CT scan obtained and shows no evidence of PE, dissection but ? PNA or aspiration. Will cover  empirically for this, query demand ischemia in setting of PNA. Pt in agreement. Labs o/w show mild acute on chronic kidney injury, cbc with mild leukocytosis Denies fevers. Pt has been given ASA. No improvement with nitro so will trial fentanyl.   MEDICATIONS GIVEN IN ED: Medications  heparin ADULT infusion 100 units/mL (25000 units/243m) (750 Units/hr Intravenous New Bag/Given 07/18/22 0047)  atorvastatin (LIPITOR) tablet 80 mg (has no administration in time range)  carvedilol (COREG) tablet 3.125 mg (has no administration in time range)  furosemide (LASIX) tablet 20 mg (has no administration in time range)  isosorbide mononitrate (IMDUR) 24 hr tablet 30 mg (has no administration in time range)  nitroGLYCERIN (NITROSTAT) SL tablet 0.4 mg (has no administration in time range)  spironolactone (ALDACTONE) tablet 25 mg (has no administration in time range)  sacubitril-valsartan (ENTRESTO) 24-26 mg per tablet (has no administration in time range)  DULoxetine (CYMBALTA) DR capsule 30 mg (has no administration in time range)  hydrOXYzine (ATARAX) tablet 25 mg (has no administration in time range)  dapagliflozin propanediol (FARXIGA) tablet 10 mg (has no administration in time range)  pantoprazole (PROTONIX) EC tablet 40 mg (has no administration in time range)  ferrous sulfate tablet 325 mg (has no administration in time range)  cyanocobalamin (VITAMIN B12) tablet 1,000 mcg (has no administration in time range)  melatonin tablet 5 mg (has no administration in time range)  lamoTRIgine (LAMICTAL) tablet 25 mg (has no administration in time range)  cholecalciferol (VITAMIN D3) 25 MCG (1000 UNIT) tablet 1,000 Units (1,000 Units Oral Not Given 07/18/22 0012)  multivitamin-lutein (OCUVITE-LUTEIN) capsule 1 capsule (has no administration in time range)  albuterol (PROVENTIL) (2.5 MG/3ML) 0.083% nebulizer solution 2.5 mg (has no administration in time range)  loratadine (CLARITIN) tablet 10 mg (has no  administration in time range)  montelukast (SINGULAIR) tablet 10 mg (has no administration in time range)  aspirin chewable tablet 324 mg (324 mg Oral Not Given 07/18/22 0011)    Or  aspirin suppository 300 mg ( Rectal See Alternative 07/18/22 0011)  aspirin EC tablet 81 mg (has no administration in time range)  acetaminophen (TYLENOL) tablet 650 mg (has no administration in time range)  ondansetron (ZOFRAN) injection 4 mg (has no administration in time range)  0.9 % NaCl with KCl 20 mEq/ L  infusion (has no administration in time range)  traZODone (DESYREL) tablet 25 mg (has no administration in time range)  magnesium hydroxide (MILK OF MAGNESIA) suspension 30 mL (has no administration in time range)  ALPRAZolam (XANAX) tablet 0.25 mg (has no administration in time range)  azithromycin (ZITHROMAX) 500 mg in sodium chloride 0.9 % 250 mL IVPB (500 mg Intravenous New Bag/Given 07/18/22 0116)  Ampicillin-Sulbactam (UNASYN) 3 g in sodium chloride 0.9 % 100 mL IVPB (0 g Intravenous Stopped 07/18/22 0047)  fentaNYL (SUBLIMAZE) injection 50 mcg (50 mcg Intravenous Given 07/17/22 2323)  ondansetron (ZOFRAN) injection 4 mg (4 mg Intravenous Given 07/17/22 2323)  iohexol (OMNIPAQUE) 350 MG/ML injection 75 mL (75 mLs Intravenous Contrast Given 07/17/22 2254)     Consults:  Hospitalist consulted for admission   EMR reviewed       FINAL CLINICAL IMPRESSION(S) / ED DIAGNOSES   Final diagnoses:  NSTEMI (non-ST elevated myocardial infarction) ([place])  Aspiration pneumonia of right lower lobe, unspecified aspiration pneumonia type ([place])     Rx / DC Orders   ED Discharge Orders     None        Note:  This document was prepared using Dragon voice recognition software and may include unintentional dictation errors.   Duffy Bruce, MD 07/18/22 [phone]

## 2022-07-17 NOTE — H&P (Addendum)
Shawn Jennings   PATIENT NAME: Shawn Jennings    MR#:  [id number]  DATE OF BIRTH:  07-29-1946  DATE OF ADMISSION:  07/17/2022  PRIMARY CARE PHYSICIAN: Pcp, No   Patient is coming from: Home  REQUESTING/REFERRING PHYSICIAN: Duffy Bruce, MD  CHIEF COMPLAINT:   Chief Complaint  Patient presents with   Chest Pain    Pt arrived via EMS from [facility] for c/o sudden onset substernal chest pain. Pt has hx of afib and quad cabbage. Received Nitro x3 from EMS, x1inch nitro paste and ASA.     HISTORY OF PRESENT ILLNESS:  [name] is a 76 y.o. Caucasian male with medical history significant for coronary artery disease status post CABG, CHF, stage III chronic kidney disease, COPD, depression, GERD, hypertension, dyslipidemia, ischemic Myopathy, paroxysmal atrial fibrillation on Eliquis, peripheral arterial disease and type 2 diabetes mellitus, who presented to the ER with acute onset of substernal chest pain that was sharp and graded 7/10 in severity.  He had associated nausea and vomiting as well as mild diaphoresis.  He denied any worsening leg pain or edema or recent travels or surgeries.  After having nausea and vomiting he had mild dry cough and mild dyspnea.  He denies any fever or chills.  No dysuria, oliguria or hematuria or flank pain.  He has mild headache without dizziness or blurred vision.  No paresthesias or focal muscle weakness.  ED Course: When the patient came to the ER, respiratory rate was 23 and pulse oximetry was 91% on room air and later 93% with otherwise normal vital signs.  Labs reveal hypokalemia 3.4 and CO2 of 19 with blood glucose of 212 chloride 113.  BUN was 24 and creatinine 1.62 slightly above her previous levels.  Albumin was 3.4 with total protein of 6.6.  Serum lipase was 77.  High sensitive troponin was 774 and CBC showed leukocytosis of 14.  D-dimer was 1.21. EKG as reviewed by me : EKG showed normal sinus rhythm with a rate of 83 and multiple  PVCs with borderline intraventricular conduction delay Imaging: Chest, abdominal and pelvic CT revealed no evidence for PE.  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  It also showed right lower lobe peribronchial wall thickening and plugging of bronchi and minimal airspace disease compatible with infectious/inflammatory process and could be associated with aspiration.  It also showed mild cardiomegaly and aortic atherosclerosis.  The patient was given 50 kg of IV fentanyl, IV heparin, 2 g of IV Rocephin and IV Flagyl as well as 4 mg of IV Zofran.  He was sleeping comfortably during my interview.  He will be admitted to a progressive unit bed for further evaluation and management. PAST MEDICAL HISTORY:   Past Medical History:  Diagnosis Date   Aortic stenosis    a. Pt unaware of history but CT imaging 01/2019 consistent w/ AVR; b. 01/2019 Echo: Mild to mod AS. Mean grad 68mHg.   CAD (coronary artery disease)    [place]. Reports multiple stress tests over the years w/o repeat cath; c. 01/2019 MV: large, sev, fixed inf and inflat defect extending to apex. No ischemia. EF 37%-->Med rx given atypical Ss and pt wishes.   CHF (congestive heart failure) (HCC)    CKD (chronic kidney disease), stage III (HCC)    COPD (ch[place] obstructive [place] disease) [place]    Depression  Essential hypertension    GERD (gastroesophageal reflux disease)    Hyperlipidemia LDL goal <70    Ischemic cardiomyopathy    a. 01/2019 Echo: EF 40-45%, impaired relaxation. Mildly dil LA. Sev mitral annular Ca2+. Mild to mod AS.   PAD (peripheral artery disease) ([place])    a. 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   PAF (paroxysmal atrial fibrillation) (HCC)    a. CHA2DS2VASc = 4-->Xarelto '15mg'$  daily in setting of CKD.   Thyrotoxicosis    Tobacco abuse    Type II diabetes mellitus ([place])     PAST SURGICAL HISTORY:   Past Surgical  History:  Procedure Laterality Date   CARDIAC CATHETERIZATION     CHOLECYSTECTOMY     COLONOSCOPY     COLONOSCOPY WITH PROPOFOL N/A 07/20/2021   Procedure: COLONOSCOPY WITH PROPOFOL;  Surgeon: Lin Landsman, MD;  Location: [facility] ENDOSCOPY;  Service: Gastroenterology;  Laterality: N/A;   CORONARY ARTERY BYPASS GRAFT     a. [place]   ENDOSCOPIC RETROGRADE CHOLANGIOPANCREATOGRAPHY (ERCP) WITH PROPOFOL N/A 10/19/2020   Procedure: ENDOSCOPIC RETROGRADE CHOLANGIOPANCREATOGRAPHY (ERCP) WITH PROPOFOL;  Surgeon: [name], MD;  Location: [facility] ENDOSCOPY;  Service: Endoscopy;  Laterality: N/A;   ERCP N/A 12/29/2020   Procedure: ENDOSCOPIC RETROGRADE CHOLANGIOPANCREATOGRAPHY (ERCP);  Surgeon: [name], MD;  Location: [facility] ENDOSCOPY;  Service: Endoscopy;  Laterality: N/A;   ESOPHAGOGASTRODUODENOSCOPY (EGD) WITH PROPOFOL N/A 07/18/2021   Procedure: ESOPHAGOGASTRODUODENOSCOPY (EGD) WITH PROPOFOL;  Surgeon: Lin Landsman, MD;  Location: [facility] ENDOSCOPY;  Service: Gastroenterology;  Laterality: N/A;   Left AKA     RIGHT/LEFT HEART CATH AND CORONARY ANGIOGRAPHY N/A 06/06/2020   Procedure: RIGHT/LEFT HEART CATH AND CORONARY ANGIOGRAPHY;  Surgeon: Wellington Hampshire, MD;  Location: [place] CV LAB;  Service: Cardiovascular;  Laterality: N/A;    SOCIAL HISTORY:   Social History   Tobacco Use   Smoking status: Every Day    Packs/day: 1.00    Years: 62.00    Total pack years: 62.00    Types: Cigarettes   Smokeless tobacco: Never  Substance Use Topics   Alcohol use: Never    FAMILY HISTORY:   Family History  Problem Relation Age of Onset   Heart attack Mother        died @ 59.   Other Father        never knew his father.   Other Sister        overdose of sleeping pills.   Heart disease Brother        died in his 22's   Other Sister        complication of abd surgery @ 72   CAD Sister     DRUG ALLERGIES:  No Known Allergies  REVIEW OF SYSTEMS:   ROS As  per history of present illness. All pertinent systems were reviewed above. Constitutional, HEENT, cardiovascular, respiratory, GI, GU, musculoskeletal, neuro, psychiatric, endocrine, integumentary and hematologic systems were reviewed and are otherwise negative/unremarkable except for positive findings mentioned above in the HPI.   MEDICATIONS AT HOME:   Prior to Admission medications   Medication Sig Start Date End Date Taking? Authorizing Provider  ACETAMINOPHEN EXTRA STRENGTH 500 MG capsule Take 1 capsule by mouth every 6 (six) hours as needed. 01/01/22  Yes [provider]  apixaban (ELIQUIS) 5 MG TABS tablet Take 1 tablet (5 mg total) by mouth 2 (two) times daily. 07/22/21  Yes Wieting, Richard, MD  atorvastatin (LIPITOR) 80 MG tablet Take 80 mg by mouth at bedtime.  Yes [provider]  carvedilol (COREG) 3.125 MG tablet Take 1 tablet (3.125 mg total) by mouth 2 (two) times daily. 08/23/21  Yes Hackney, Otila Kluver A, FNP  D 1000 25 MCG (1000 UT) capsule Take 1 capsule by mouth daily. 10/25/21  Yes [provider]  dapagliflozin propanediol (FARXIGA) 10 MG TABS tablet Take 1 tablet (10 mg total) by mouth daily. 11/23/21  Yes Hackney, Otila Kluver A, FNP  DULoxetine (CYMBALTA) 30 MG capsule Take 30 mg by mouth 2 (two) times daily.   Yes [provider]  ferrous sulfate 325 (65 FE) MG tablet Take 325 mg by mouth every other day.   Yes [provider]  isosorbide mononitrate (IMDUR) 30 MG 24 hr tablet Take 1 tablet (30 mg total) by mouth daily. 03/11/20 05/27/29 Yes Sharen Hones, MD  lamoTRIgine (LAMICTAL) 25 MG tablet Take 25 mg by mouth 2 (two) times daily.   Yes [provider]  loratadine (CLARITIN) 10 MG tablet Take 10 mg by mouth daily.   Yes [provider]  Melatonin 3 MG TABS Take 5 mg by mouth at bedtime.   Yes [provider]  montelukast (SINGULAIR) 10 MG tablet Take 10 mg by mouth at bedtime.   Yes [provider]  Multiple  Vitamins-Minerals (PRESERVISION AREDS) CAPS Take 1 capsule by mouth in the morning and at bedtime.   Yes [provider]  nitroGLYCERIN (NITROSTAT) 0.4 MG SL tablet Place 0.4 mg under the tongue every 5 (five) minutes as needed for chest pain.   Yes [provider]  pantoprazole (PROTONIX) 40 MG tablet Take 40 mg by mouth daily.   Yes [provider]  sacubitril-valsartan (ENTRESTO) 24-26 MG Take 1 tablet by mouth 2 (two) times daily.   Yes [provider]  spironolactone (ALDACTONE) 25 MG tablet Take 1 tablet (25 mg total) by mouth daily. 06/11/22  Yes Hackney, Otila Kluver A, FNP  vitamin B-12 (CYANOCOBALAMIN) 1000 MCG tablet Take 1,000 mcg by mouth daily.   Yes [provider]  albuterol (VENTOLIN HFA) 108 (90 Base) MCG/ACT inhaler Inhale 2 puffs into the lungs every 6 (six) hours as needed for wheezing or shortness of breath.     [provider]  BIOFREEZE COOL THE PAIN 4 % GEL Apply 1 Application topically 3 (three) times daily as needed. Apply to right shoulder three times daily as needed for pain 02/26/22   [provider]  furosemide (LASIX) 20 MG tablet Take 1 tablet (20 mg total) by mouth daily. 11/10/21   Jennye Boroughs, MD  hydrOXYzine (ATARAX/VISTARIL) 25 MG tablet Take 25 mg by mouth every 6 (six) hours as needed (allergy symptoms).    [provider]  potassium chloride (KLOR-CON) 10 MEQ tablet Take 1 tablet (10 mEq total) by mouth daily. Patient not taking: Reported on 07/17/2022 11/23/21   Alisa Graff, FNP      VITAL SIGNS:  Blood pressure (!) 158/88, pulse 69, temperature 97.9 F (36.6 C), temperature source Oral, resp. rate 11, height 6' (1.829 m), weight 62.7 kg, SpO2 100 %.  PHYSICAL EXAMINATION:  Physical Exam  GENERAL:  76 y.o.-year-old patient lying in the bed with no acute distress.  EYES: Pupils equal, round, reactive to light and accommodation. No scleral icterus. Extraocular muscles intact.  HEENT: Head  atraumatic, normocephalic. Oropharynx and nasopharynx clear.  NECK:  Supple, no jugular venous distention. No thyroid enlargement, no tenderness.  LUNGS: Diminished right basal breath sounds with right basal crackles.  No use of  accessory muscles of respiration.  CARDIOVASCULAR: Regular rate and rhythm, S1, S2 normal. No murmurs, rubs, or gallops.  ABDOMEN: Soft, nondistended, nontender. Bowel sounds present. No organomegaly or mass.  EXTREMITIES: He has a left AKA with intact stump.  No right pedal edema, cyanosis, or clubbing.  NEUROLOGIC: Cranial nerves II through XII are intact. Muscle strength 5/5 in all extremities. Sensation intact. Gait not checked.  PSYCHIATRIC: The patient is alert and oriented x 3.  Normal affect and good eye contact. SKIN: No obvious rash, lesion, or ulcer.   LABORATORY PANEL:   CBC Recent Labs  Lab 07/17/22 2204  WBC 14.0*  HGB 14.2  HCT 45.9  PLT 184   ------------------------------------------------------------------------------------------------------------------  Chemistries  Recent Labs  Lab 07/17/22 2204  NA 142  K 3.4*  CL 113*  CO2 19*  GLUCOSE 212*  BUN 24*  CREATININE 1.62*  CALCIUM 8.9  AST 20  ALT 13  ALKPHOS 123  BILITOT 0.8   ------------------------------------------------------------------------------------------------------------------  Cardiac Enzymes No results for input(s): "TROPONINI" in the last 168 hours. ------------------------------------------------------------------------------------------------------------------  RADIOLOGY:  CT ABDOMEN PELVIS W CONTRAST  Result Date: 07/17/2022 CLINICAL DATA:  Epigastric pain EXAM: CT ABDOMEN AND PELVIS WITH CONTRAST TECHNIQUE: Multidetector CT imaging of the abdomen and pelvis was performed using the standard protocol following bolus administration of intravenous contrast. RADIATION DOSE REDUCTION: This exam was performed according to the departmental dose-optimization  program which includes automated exposure control, adjustment of the mA and/or kV according to patient size and/or use of iterative reconstruction technique. CONTRAST:  45m OMNIPAQUE IOHEXOL 350 MG/ML SOLN COMPARISON:  CT 07/15/2021, 10/17/2020 FINDINGS: Lower chest: Lung bases are degraded by motion. No consolidative process, see separately dictated chest CT. Aortic valve prosthesis. Hepatobiliary: No focal liver abnormality is seen. Status post cholecystectomy. No biliary dilatation. Pancreas: Unremarkable. No pancreatic ductal dilatation or surrounding inflammatory changes. Spleen: Normal in size without focal abnormality. Adrenals/Urinary Tract: Adrenal glands are within normal limits. Kidneys show no hydronephrosis. Subcentimeter hypodense renal lesions too small to further characterize. Partially exophytic upper pole lesion on the right measuring 1.9 cm, contains slightly dense complex area, series 4, image 26. Mild wall thickening along the right bladder wall with slightly asymmetric mucosal enhancement, series 4, image 75. Stomach/Bowel: The stomach is nonenlarged. No dilated small bowel. No acute bowel wall thickening. Postsurgical changes in the left upper quadrant with chronically dilated segment of bowel in the region but no obstruction. Negative appendix Vascular/Lymphatic: Advanced aortic atherosclerosis. No aneurysm. Chronically occluded left SFA stent incompletely visualized. No suspicious lymph nodes. Reproductive: Prostate is unremarkable. Other: Negative for pelvic effusion or free air. Musculoskeletal: No acute osseous abnormality. IMPRESSION: 1. No CT evidence for acute intra-abdominal or pelvic abnormality. 2. 19 mm partially exophytic complex cystic lesion upper pole right kidney. When the patient is clinically stable and able to follow directions and hold their breath (preferably as an outpatient) further evaluation with dedicated abdominal MRI should be considered. 3. Mild wall thickening  with slight asymmetric mucosal enhancement at the right posterior bladder wall, indeterminate for an area of infection or inflammation. 4. Other chronic findings as discussed above Electronically Signed   By: KDonavan FoilM.D.   On: 07/17/2022 23:16   CT Angio Chest PE W and/or Wo Contrast  Result Date: 07/17/2022 CLINICAL DATA:  Positive D-dimer with epigastric pain. EXAM: CT ANGIOGRAPHY CHEST WITH CONTRAST TECHNIQUE: Multidetector CT imaging of the chest was performed using the standard protocol during bolus administration of intravenous contrast. Multiplanar CT image reconstructions and  MIPs were obtained to evaluate the vascular anatomy. RADIATION DOSE REDUCTION: This exam was performed according to the departmental dose-optimization program which includes automated exposure control, adjustment of the mA and/or kV according to patient size and/or use of iterative reconstruction technique. CONTRAST:  81m OMNIPAQUE IOHEXOL 350 MG/ML SOLN COMPARISON:  CT angiogram chest 08/03/2021 FINDINGS: Cardiovascular: There is mild aneurysmal dilatation of the ascending aorta measuring 4.1 cm which is unchanged. There are atherosclerotic calcifications of the aorta. Aortic valve replacement is present. The heart is mildly enlarged. There is no pericardial effusion. There are atherosclerotic calcifications of the aorta and coronary arteries. There is adequate opacification of the pulmonary arteries to the segmental level. No pulmonary embolism identified. Mediastinum/Nodes: No enlarged mediastinal, hilar, or axillary lymph nodes. Thyroid gland, trachea, and esophagus demonstrate no significant findings. Lungs/Pleura: There are minimal patchy airspace opacities in the right lower lobe. There is right lower lobe peribronchial wall thickening and plugging of bronchi similar to prior study. Right pleural effusion has resolved in the interval. There is minimal left lower lobe atelectasis. There is no evidence for pneumothorax.  Upper Abdomen: Cholecystectomy clips are present. Musculoskeletal: No chest wall abnormality. No acute or significant osseous findings. Sternotomy wires are present. Review of the MIP images confirms the above findings. IMPRESSION: 1. No evidence for pulmonary embolism. 2. 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 Recommend annual imaging followup by CTA or MRA. This recommendation follows 2010 ACCF/AHA/AATS/ACR/ASA/SCA/SCAI/SIR/STS/SVM Guidelines for the Diagnosis and Management of Patients with Thoracic Aortic Disease. Circulation. 2010; 121:: G956-O130 Aortic aneurysm NOS (ICD10-I71.9) 3. Right lower lobe peribronchial wall thickening, plugging of bronchi and minimal airspace disease compatible with infectious/inflammatory process. Correlate clinically for aspiration. 4. Mild cardiomegaly. 5.  Aortic Atherosclerosis (ICD10-I70.0). Electronically Signed   By: ARonney AstersM.D.   On: 07/17/2022 23:13   DG Chest Port 1 View  Result Date: 07/17/2022 CLINICAL DATA:  Chest pain EXAM: PORTABLE CHEST 1 VIEW COMPARISON:  11/08/2021 FINDINGS: Prior CABG. Heart and mediastinal contours are within normal limits. No focal opacities or effusions. No acute bony abnormality. IMPRESSION: No active cardiopulmonary disease. Electronically Signed   By: KRolm BaptiseM.D.   On: 07/17/2022 22:14      IMPRESSION AND PLAN:  Assessment and Plan: * Non-STEMI (non-ST elevated myocardial infarction) ([place] - The patient will be admitted to a progressive unit bed. - We will continue on IV heparin drip. - He will be resumed on high-dose statin. - We will place him on as needed sublingual nitroglycerin and IV morphine sulfate for pain. - 2D echo and cardiology consult will be obtained. - I notified Dr. ROval Linseyabout the patient.  Aspiration pneumonia ([place] - The patient will be placed on IV Unasyn and Zithromax. - We will follow blood cultures. - Mucolytic therapy will be provided as needed as  well as bronchodilator therapy.  Essential hypertension - We will continue his antihypertensives.  Dyslipidemia - The patient will be continued on high-dose statin therapy. - We will obtain fasting lipids.  HFrEF (heart failure with reduced ejection fraction) ([place] - We will continue Aldactone, Entresto, Imdur, Coreg and FIran  Type 2 diabetes mellitus with stage 3a chronic kidney disease, without long-term current use of insulin ([place] - The patient will be placed on supplement coverage with NovoLog. - We will continue his FIran  Hypokalemia - Potassium will be replaced and magnesium level will be checked.   DVT prophylaxis: IV heparin.  Advanced Care Planning:  Code Status: full code.  Family Communication:  The plan of care was discussed in details with the patient (and family). I answered all questions. The patient agreed to proceed with the above mentioned plan. Further management will depend upon hospital course. Disposition Plan: Back to previous home environment Consults called: Cardiology. All the records are reviewed and case discussed with ED provider.  Status is: Inpatient    At the time of the admission, it appears that the appropriate admission status for this patient is inpatient.  This is judged to be reasonable and necessary in order to provide the required intensity of service to ensure the patient's safety given the presenting symptoms, physical exam findings and initial radiographic and laboratory data in the context of comorbid conditions.  The patient requires inpatient status due to high intensity of service, high risk of further deterioration and high frequency of surveillance required.  I certify that at the time of admission, it is my clinical judgment that the patient will require inpatient hospital care extending more than 2 midnights.                            Dispo: The patient is from: Home              Anticipated d/c is to: Home               Patient currently is not medically stable to d/c.              Difficult to place patient: No  Christel Mormon M.D on 07/18/2022 at 5:25 AM  [facility]   From 7 PM-7 AM, contact night-coverage [email or web address]  CC: Primary care physician; Pcp, No

## 2022-07-18 ENCOUNTER — Encounter
Admission: EM | Disposition: A | Discharge status: Skilled Nursing Facility | Payer: Self-pay | Source: Home / Self Care | Attending: Family Medicine

## 2022-07-18 ENCOUNTER — Encounter: Payer: Self-pay | Admitting: Family Medicine

## 2022-07-18 ENCOUNTER — Inpatient Hospital Stay (HOSPITAL_COMMUNITY)
Admit: 2022-07-18 | Discharge: 2022-07-18 | Disposition: A | Discharge status: Home / Self Care | Payer: Medicare Other | Attending: Family Medicine | Admitting: Family Medicine

## 2022-07-18 ENCOUNTER — Other Ambulatory Visit: Payer: Self-pay

## 2022-07-18 DIAGNOSIS — I214 Non-ST elevation (NSTEMI) myocardial infarction: Secondary | ICD-10-CM

## 2022-07-18 DIAGNOSIS — I25810 Atherosclerosis of coronary artery bypass graft(s) without angina pectoris: Secondary | ICD-10-CM | POA: Diagnosis not present

## 2022-07-18 DIAGNOSIS — E785 Hyperlipidemia, unspecified: Secondary | ICD-10-CM

## 2022-07-18 DIAGNOSIS — I5020 Unspecified systolic (congestive) heart failure: Secondary | ICD-10-CM | POA: Diagnosis not present

## 2022-07-18 DIAGNOSIS — I480 Paroxysmal atrial fibrillation: Secondary | ICD-10-CM | POA: Diagnosis not present

## 2022-07-18 DIAGNOSIS — J690 Pneumonitis due to inhalation of food and vomit: Secondary | ICD-10-CM

## 2022-07-18 HISTORY — PX: RIGHT HEART CATH AND CORONARY/GRAFT ANGIOGRAPHY: CATH118265

## 2022-07-18 HISTORY — PX: CORONARY STENT INTERVENTION: CATH118234

## 2022-07-18 LAB — BASIC METABOLIC PANEL
Anion gap: 9 (ref 5–15)
Anion gap: 13 (ref 5–15)
BUN: 20 mg/dL (ref 8–23)
BUN: 20 mg/dL (ref 8–23)
CO2: 21 mmol/L — ABNORMAL LOW (ref 22–32)
CO2: 17 mmol/L — ABNORMAL LOW (ref 22–32)
Calcium: 8.4 mg/dL — ABNORMAL LOW (ref 8.9–10.3)
Calcium: 8.4 mg/dL — ABNORMAL LOW (ref 8.9–10.3)
Chloride: 114 mmol/L — ABNORMAL HIGH (ref 98–111)
Chloride: 112 mmol/L — ABNORMAL HIGH (ref 98–111)
Creatinine, Ser: 1.63 mg/dL — ABNORMAL HIGH (ref 0.61–1.24)
Creatinine, Ser: 1.56 mg/dL — ABNORMAL HIGH (ref 0.61–1.24)
GFR, Estimated: 43 mL/min — ABNORMAL LOW (ref >60)
GFR, Estimated: 45 mL/min — ABNORMAL LOW (ref >60)
Glucose, Bld: 145 mg/dL — ABNORMAL HIGH (ref 70–99)
Glucose, Bld: 133 mg/dL — ABNORMAL HIGH (ref 70–99)
Potassium: 4 mmol/L (ref 3.5–5.1)
Potassium: 4.2 mmol/L (ref 3.5–5.1)
Sodium: 144 mmol/L (ref 135–145)
Sodium: 142 mmol/L (ref 135–145)

## 2022-07-18 LAB — ECHOCARDIOGRAM COMPLETE
AR max vel: 1.91 cm2
AV Area VTI: 1.84 cm2
AV Area mean vel: 1.8 cm2
AV Mean grad: 6 mmHg
AV Peak grad: 10.2 mmHg
Ao pk vel: 1.6 m/s
Area-P 1/2: 5.9 cm2
Height: 72 in
S' Lateral: 5.1 cm
Weight: 2211.65 oz

## 2022-07-18 LAB — CBC
HCT: 48.6 % (ref 39.0–52.0)
Hemoglobin: 14.8 g/dL (ref 13.0–17.0)
MCH: 25 pg — ABNORMAL LOW (ref 26.0–34.0)
MCHC: 30.5 g/dL (ref 30.0–36.0)
MCV: 82.1 fL (ref 80.0–100.0)
Platelets: 183 10*3/uL (ref 150–400)
RBC: 5.92 MIL/uL — ABNORMAL HIGH (ref 4.22–5.81)
RDW: 18 % — ABNORMAL HIGH (ref 11.5–15.5)
WBC: 14.2 10*3/uL — ABNORMAL HIGH (ref 4.0–10.5)
nRBC: 0 % (ref 0.0–0.2)

## 2022-07-18 LAB — LIPID PANEL
Cholesterol: 164 mg/dL (ref 0–200)
HDL: 44 mg/dL (ref >40)
LDL Cholesterol: 93 mg/dL (ref 0–99)
Total CHOL/HDL Ratio: 3.7 RATIO
Triglycerides: 135 mg/dL (ref <150)
VLDL: 27 mg/dL (ref 0–40)

## 2022-07-18 LAB — PROTIME-INR
INR: 1.3 — ABNORMAL HIGH (ref 0.8–1.2)
Prothrombin Time: 15.7 seconds — ABNORMAL HIGH (ref 11.4–15.2)

## 2022-07-18 LAB — GLUCOSE, CAPILLARY
Glucose-Capillary: 118 mg/dL — ABNORMAL HIGH (ref 70–99)
Glucose-Capillary: 192 mg/dL — ABNORMAL HIGH (ref 70–99)

## 2022-07-18 LAB — TROPONIN I (HIGH SENSITIVITY): Troponin I (High Sensitivity): 1889 ng/L (ref <18)

## 2022-07-18 LAB — HEMOGLOBIN A1C
Hgb A1c MFr Bld: 7.7 % — ABNORMAL HIGH (ref 4.8–5.6)
Mean Plasma Glucose: 174.29 mg/dL

## 2022-07-18 LAB — MRSA NEXT GEN BY PCR, NASAL: MRSA by PCR Next Gen: NOT DETECTED

## 2022-07-18 LAB — CBG MONITORING, ED
Glucose-Capillary: 129 mg/dL — ABNORMAL HIGH (ref 70–99)
Glucose-Capillary: 153 mg/dL — ABNORMAL HIGH (ref 70–99)

## 2022-07-18 LAB — MAGNESIUM: Magnesium: 1.9 mg/dL (ref 1.7–2.4)

## 2022-07-18 LAB — POCT ACTIVATED CLOTTING TIME: Activated Clotting Time: 383 seconds

## 2022-07-18 LAB — HIV ANTIBODY (ROUTINE TESTING W REFLEX): HIV Screen 4th Generation wRfx: NONREACTIVE

## 2022-07-18 LAB — APTT
aPTT: 31 seconds (ref 24–36)
aPTT: 56 seconds — ABNORMAL HIGH (ref 24–36)

## 2022-07-18 LAB — HEPARIN LEVEL (UNFRACTIONATED): Heparin Unfractionated: >1.1 IU/mL — ABNORMAL HIGH (ref 0.30–0.70)

## 2022-07-18 SURGERY — RIGHT HEART CATH AND CORONARY/GRAFT ANGIOGRAPHY
Anesthesia: Moderate Sedation

## 2022-07-18 MED ORDER — HEPARIN SODIUM (PORCINE) 1000 UNIT/ML IJ SOLN
INTRAMUSCULAR | Status: AC
  Filled 2022-07-18: qty 10

## 2022-07-18 MED ORDER — NICOTINE 14 MG/24HR TD PT24
14.0000 mg | MEDICATED_PATCH | Freq: Every day | TRANSDERMAL | Status: DC
  Administered 2022-07-18 – 2022-07-19 (×2): 14 mg via TRANSDERMAL
  Filled 2022-07-18 (×3): qty 1

## 2022-07-18 MED ORDER — BIVALIRUDIN BOLUS VIA INFUSION - CUPID
INTRAVENOUS | Status: DC | PRN
  Administered 2022-07-18: 47.025 mg via INTRAVENOUS

## 2022-07-18 MED ORDER — MORPHINE SULFATE (PF) 2 MG/ML IV SOLN
2.0000 mg | Freq: Once | INTRAVENOUS | Status: AC
  Administered 2022-07-18: 2 mg via INTRAVENOUS
  Filled 2022-07-18: qty 1

## 2022-07-18 MED ORDER — HEPARIN (PORCINE) IN NACL 1000-0.9 UT/500ML-% IV SOLN
INTRAVENOUS | Status: AC
  Filled 2022-07-18: qty 1000

## 2022-07-18 MED ORDER — CLOPIDOGREL BISULFATE 75 MG PO TABS
ORAL_TABLET | ORAL | Status: AC
  Filled 2022-07-18: qty 8

## 2022-07-18 MED ORDER — HEPARIN BOLUS VIA INFUSION
900.0000 [IU] | Freq: Once | INTRAVENOUS | Status: AC
  Administered 2022-07-18: 900 [IU] via INTRAVENOUS
  Filled 2022-07-18: qty 900

## 2022-07-18 MED ORDER — LIDOCAINE HCL 1 % IJ SOLN
INTRAMUSCULAR | Status: AC
  Filled 2022-07-18: qty 20

## 2022-07-18 MED ORDER — CLOPIDOGREL BISULFATE 75 MG PO TABS
75.0000 mg | ORAL_TABLET | Freq: Every day | ORAL | Status: DC
  Administered 2022-07-19: 75 mg via ORAL
  Filled 2022-07-18: qty 1

## 2022-07-18 MED ORDER — VERAPAMIL HCL 2.5 MG/ML IV SOLN
INTRAVENOUS | Status: AC
  Filled 2022-07-18: qty 2

## 2022-07-18 MED ORDER — FENTANYL CITRATE (PF) 100 MCG/2ML IJ SOLN
INTRAMUSCULAR | Status: DC | PRN
  Administered 2022-07-18: 12.5 ug via INTRAVENOUS

## 2022-07-18 MED ORDER — SODIUM CHLORIDE 0.9 % IV SOLN
500.0000 mg | INTRAVENOUS | Status: DC
  Administered 2022-07-18 – 2022-07-19 (×2): 500 mg via INTRAVENOUS
  Filled 2022-07-18 (×2): qty 5

## 2022-07-18 MED ORDER — SODIUM CHLORIDE 0.9 % IV SOLN
INTRAVENOUS | Status: DC | PRN
  Administered 2022-07-18: 1.75 mg/kg/h via INTRAVENOUS

## 2022-07-18 MED ORDER — HEPARIN (PORCINE) 25000 UT/250ML-% IV SOLN
700.0000 [IU]/h | INTRAVENOUS | Status: DC
  Administered 2022-07-18: 850 [IU]/h via INTRAVENOUS
  Filled 2022-07-18: qty 250

## 2022-07-18 MED ORDER — SODIUM CHLORIDE 0.9 % IV SOLN
3.0000 g | Freq: Four times a day (QID) | INTRAVENOUS | Status: DC
  Administered 2022-07-18 – 2022-07-19 (×5): 3 g via INTRAVENOUS
  Filled 2022-07-18 (×7): qty 8

## 2022-07-18 MED ORDER — ACETAMINOPHEN 325 MG PO TABS: 650.0000 mg | ORAL_TABLET | ORAL | Status: DC | PRN

## 2022-07-18 MED ORDER — PERFLUTREN LIPID MICROSPHERE
1.0000 mL | INTRAVENOUS | Status: AC | PRN
  Administered 2022-07-18: 4 mL via INTRAVENOUS

## 2022-07-18 MED ORDER — SODIUM CHLORIDE 0.9% FLUSH: 3.0000 mL | INTRAVENOUS | Status: DC | PRN

## 2022-07-18 MED ORDER — MIDAZOLAM HCL 2 MG/2ML IJ SOLN
INTRAMUSCULAR | Status: DC | PRN
  Administered 2022-07-18: .5 mg via INTRAVENOUS

## 2022-07-18 MED ORDER — VERAPAMIL HCL 2.5 MG/ML IV SOLN
INTRAVENOUS | Status: DC | PRN
  Administered 2022-07-18: 500 ug via INTRACORONARY

## 2022-07-18 MED ORDER — SODIUM CHLORIDE 0.9 % IV SOLN
INTRAVENOUS | Status: DC | PRN
  Administered 2022-07-18: 250 mL via INTRAVENOUS

## 2022-07-18 MED ORDER — NITROGLYCERIN 2 % TD OINT
1.0000 [in_us] | TOPICAL_OINTMENT | Freq: Four times a day (QID) | TRANSDERMAL | Status: DC
  Administered 2022-07-18: 1 [in_us] via TOPICAL
  Filled 2022-07-18: qty 1

## 2022-07-18 MED ORDER — SODIUM CHLORIDE 0.9% FLUSH
3.0000 mL | Freq: Two times a day (BID) | INTRAVENOUS | Status: DC
  Administered 2022-07-19: 3 mL via INTRAVENOUS

## 2022-07-18 MED ORDER — LIDOCAINE HCL (PF) 1 % IJ SOLN
INTRAMUSCULAR | Status: DC | PRN
  Administered 2022-07-18 (×2): 2 mL
  Administered 2022-07-18: 15 mL

## 2022-07-18 MED ORDER — IOHEXOL 300 MG/ML  SOLN
INTRAMUSCULAR | Status: DC | PRN
  Administered 2022-07-18: 100 mL

## 2022-07-18 MED ORDER — BIVALIRUDIN TRIFLUOROACETATE 250 MG IV SOLR
INTRAVENOUS | Status: AC
  Filled 2022-07-18: qty 250

## 2022-07-18 MED ORDER — SODIUM CHLORIDE 0.9 % IV SOLN: INTRAVENOUS | Status: DC

## 2022-07-18 MED ORDER — SODIUM CHLORIDE 0.9 % IV SOLN: INTRAVENOUS | Status: AC

## 2022-07-18 MED ORDER — SODIUM CHLORIDE 0.9 % IV SOLN: 250.0000 mL | INTRAVENOUS | Status: DC | PRN

## 2022-07-18 MED ORDER — INSULIN ASPART 100 UNIT/ML IJ SOLN
0.0000 [IU] | Freq: Three times a day (TID) | INTRAMUSCULAR | Status: DC
  Administered 2022-07-18: 1 [IU] via SUBCUTANEOUS
  Administered 2022-07-18 – 2022-07-19 (×2): 2 [IU] via SUBCUTANEOUS
  Administered 2022-07-19: 1 [IU] via SUBCUTANEOUS
  Filled 2022-07-18 (×4): qty 1

## 2022-07-18 MED ORDER — CLOPIDOGREL BISULFATE 75 MG PO TABS
ORAL_TABLET | ORAL | Status: DC | PRN
  Administered 2022-07-18: 600 mg via ORAL

## 2022-07-18 MED ORDER — MORPHINE SULFATE (PF) 2 MG/ML IV SOLN
2.0000 mg | INTRAVENOUS | Status: DC | PRN
  Administered 2022-07-18: 2 mg via INTRAVENOUS
  Filled 2022-07-18: qty 1

## 2022-07-18 MED ORDER — HEPARIN (PORCINE) IN NACL 1000-0.9 UT/500ML-% IV SOLN
INTRAVENOUS | Status: DC | PRN
  Administered 2022-07-18 (×2): 500 mL

## 2022-07-18 MED ORDER — HYDRALAZINE HCL 20 MG/ML IJ SOLN: 10.0000 mg | INTRAMUSCULAR | Status: AC | PRN

## 2022-07-18 MED ORDER — MIDAZOLAM HCL 2 MG/2ML IJ SOLN
INTRAMUSCULAR | Status: AC
  Filled 2022-07-18: qty 2

## 2022-07-18 MED ORDER — SODIUM CHLORIDE 0.9% FLUSH
3.0000 mL | Freq: Two times a day (BID) | INTRAVENOUS | Status: DC
  Administered 2022-07-18 – 2022-07-19 (×2): 3 mL via INTRAVENOUS

## 2022-07-18 MED ORDER — ASPIRIN 81 MG PO CHEW: 81.0000 mg | CHEWABLE_TABLET | ORAL | Status: DC

## 2022-07-18 MED ORDER — FENTANYL CITRATE (PF) 100 MCG/2ML IJ SOLN
INTRAMUSCULAR | Status: AC
  Filled 2022-07-18: qty 2

## 2022-07-18 MED ORDER — GUAIFENESIN-DM 100-10 MG/5ML PO SYRP: 5.0000 mL | ORAL_SOLUTION | ORAL | Status: DC | PRN

## 2022-07-18 MED ORDER — INSULIN ASPART 100 UNIT/ML IJ SOLN: 0.0000 [IU] | Freq: Every day | INTRAMUSCULAR | Status: DC

## 2022-07-18 SURGICAL SUPPLY — 27 items
BALLN TREK RX 2.25X12 (BALLOONS) ×1
BALLN ~~LOC~~ TREK NEO RX 3.5X15 (BALLOONS) ×1
BALLOON TREK RX 2.25X12 (BALLOONS) IMPLANT
BALLOON ~~LOC~~ TREK NEO RX 3.5X15 (BALLOONS) IMPLANT
CANNULA 5F STIFF (CANNULA) IMPLANT
CATH BALLN WEDGE 5F 110CM (CATHETERS) IMPLANT
CATH INFINITI 5 FR IM (CATHETERS) IMPLANT
CATH INFINITI 5 FR MPA2 (CATHETERS) IMPLANT
CATH INFINITI 5FR JL4 (CATHETERS) IMPLANT
CATH LAUNCHER 6FR AL1 (CATHETERS) IMPLANT
CATHETER LAUNCHER 6FR AL1 (CATHETERS) ×1
DEVICE CLOSURE MYNXGRIP 6/7F (Vascular Products) IMPLANT
DRAPE BRACHIAL (DRAPES) IMPLANT
GLIDESHEATH SLEND A-KIT 6F 22G (SHEATH) IMPLANT
GUIDEWIRE ANGLED .035 180CM (WIRE) IMPLANT
KIT ENCORE 26 ADVANTAGE (KITS) IMPLANT
PACK CARDIAC CATH (CUSTOM PROCEDURE TRAY) ×1 IMPLANT
PROTECTION STATION PRESSURIZED (MISCELLANEOUS) ×1
SET ATX SIMPLICITY (MISCELLANEOUS) IMPLANT
SHEATH AVANTI 5FR X 11CM (SHEATH) IMPLANT
SHEATH AVANTI 6FR X 11CM (SHEATH) IMPLANT
SHEATH GLIDE SLENDER 4/5FR (SHEATH) IMPLANT
STATION PROTECTION PRESSURIZED (MISCELLANEOUS) IMPLANT
STENT ONYX FRONTIER 3.0X22 (Permanent Stent) IMPLANT
WIRE GUIDERIGHT .035X150 (WIRE) IMPLANT
WIRE HITORQ VERSACORE ST 145CM (WIRE) IMPLANT
WIRE RUNTHROUGH .014X180CM (WIRE) IMPLANT

## 2022-07-18 NOTE — Progress Notes (Signed)
ANTICOAGULATION CONSULT NOTE - Initial Consult  Pharmacy Consult for Heparin  Indication: chest pain/ACS  No Known Allergies  Patient Measurements: Height: 6' (182.9 cm) Weight: 62.7 kg (138 lb 3.7 oz) IBW/kg (Calculated) : 77.6 Heparin Dosing Weight: 62.7 kg   Vital Signs: Temp: 98.2 F (36.8 C) (09/27 0754) Temp Source: Oral (09/27 0754) BP: 155/89 (09/27 0830) Pulse Rate: 77 (09/27 0830)  Labs: Recent Labs    07/17/22 2204 07/17/22 2206 07/17/22 2324 07/18/22 0850  HGB 14.2  --   --  14.8  HCT 45.9  --   --  48.6  PLT 184  --   --  183  APTT  --  31  --   --   LABPROT  --  15.7*  --   --   INR  --  1.3*  --   --   CREATININE 1.62*  --   --   --   TROPONINIHS 774*  --  1,889*  --      Estimated Creatinine Clearance: 33.3 mL/min (A) (by C-G formula based on SCr of 1.62 mg/dL (H)).   Medical History: Past Medical History:  Diagnosis Date   Aortic stenosis    a. Pt unaware of history but CT imaging 01/2019 consistent w/ AVR; b. 01/2019 Echo: Mild to mod AS. Mean grad 28mHg.   CAD (coronary artery disease)    [place] b. Reports multiple stress tests over the years w/o repeat cath; c. 01/2019 MV: large, sev, fixed inf and inflat defect extending to apex. No ischemia. EF 37%-->Med rx given atypical Ss and pt wishes.   CHF (congestive heart failure) (HCC)    CKD (chronic kidney disease), stage III (HCC)    COPD (chronic obstructive pulmonary disease) (HCC)    Depression    Essential hypertension    GERD (gastroesophageal reflux disease)    Hyperlipidemia LDL goal <70    Ischemic cardiomyopathy    a. 01/2019 Echo: EF 40-45%, impaired relaxation. Mildly dil LA. Sev mitral annular Ca2+. Mild to mod AS.   PAD ([place] artery disease) [place]    a. 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   PAF (paroxysmal atrial fibrillation) (HCC)    a. CHA2DS2VASc = 4-->Xarelto '15mg'$  daily in setting of CKD.   Thyrotoxicosis    Tobacco abuse    Type II diabetes mellitus (HCC)      Medications:  Apixaban 5 mg PO BID  Assessment: 76yo M with PMH CAD s/p CABGx4, ischemic cardiomyopathy, paroxysmal afib, and PAD. Pt admitted with ACS/NSTEMI after expericiencing sudden onset substernal chest pain, which patient described as worsening and continuous, sharp, and tearing. Pt received NTG x3 , nitropaste, and ASA from EMS. In the ED, troponins were 74 >> 1889. Planned for cath lab this afternoon. Pt on apixaban 5 mg PO BID PTA for Afib, last dose on 9/26 @ 2019. Pharmacy is consulted to manage transitioning from apixaban to heparin for ACS/NSTEMI. aPTT and HL are not correlating yet, so continuing to titrate by aPTT. Hgb, Hct, PLT stable.  Baseline Labs: aPTT - 31; INR - 1.3 Hgb - 14.2; Plts - 184  Goal of Therapy: aPTT = 66 - 102  Monitor platelets by anticoagulation protocol: Yes  Date Time aPTT/HL Rate/Comment 9/27 0948 aPTT 56 Subtherapeutic; 750 >> 850 9/27 0948 HL >1.10 HL and aPTT not correlating   Plan:  Bolus heparin 900 units x 1 Increase heparin infusion rate to 850 un/hr Check aPTT/Anti-Xa level in 8 hours and  daily once consecutively therapeutic.  Titrate by aPTT's until lab correlation is noted, then titrate by anti-xa alone. Continue to monitor H&H and platelets daily while on heparin gtt.  Dara Hoyer, PharmD PGY-1 Pharmacy Resident 07/18/2022 9:28 AM

## 2022-07-18 NOTE — ED Notes (Signed)
Sent extra blood tubes per lab request. Red, lt green, lavender.

## 2022-07-18 NOTE — Progress Notes (Signed)
Pt drinking water, moisturizer applied to lips

## 2022-07-18 NOTE — ED Notes (Signed)
Overdue 1000 meds will come from main pharmacy.

## 2022-07-18 NOTE — Hospital Course (Addendum)
Shawn Jennings is a 76 y.o. Caucasian male with medical history significant for coronary artery disease status post CABG, HFrEF (last echocardiogram on file 08/03/2021, LVEF 20 to 25% with severely decreased LV function, global hypokinesis, moderately dilated internal cavity, RV normal), stage III chronic kidney disease, COPD, depression, GERD, hypertension, dyslipidemia, ischemic Myopathy, paroxysmal atrial fibrillation on Eliquis, peripheral arterial disease and type 2 diabetes mellitus, who presented to the ER 07/17/22 with acute onset of substernal chest pain that was sharp and graded 7/10 in severity.  He had associated nausea and vomiting as well as mild diaphoresis. Received Nitro x3 from EMS, x1inch nitro paste and ASA.  09/26: In ED, RR 23, initial SPO2 91% on RA, increased to 93-100%.  Hypokalemia mild at 3.4, creatinine 1.62 slightly elevated above baseline, WBC 14. Troponin 774.  EKG NSR, multiple PVC, borderline IV conduction delay.  CT chest/abdomen/pelvis no evidence for PE, stable ascending aortic aneurysmal dilatation, follow-up annual CTA/MRA.  RLL peribronchial thickening/plugging, infectious/inflammatory possible aspiration.  Treated with fentanyl, placed on IV heparin drip, given Rocephin/Flagyl.  Admitted to medicine service NSTEMI, aspiration pneumonia.  Per H&P, Dr. Oval Linsey of [facility] cardiology practice was notified.  Echocardiogram ordered.  Continued on IV Unasyn and Zithromax.  Second troponin was 1889 09/27: VSS, BP above goal but not seriously/symptomatically hypertensive.  Patient continues to have chest pain, unresponsive to nitro, tender to palpation on right anterior ribs/sternum.  Cardiology planning catheterization   Consultants:  Cardiology  Procedures: [Cardiac catheterization pending]      ASSESSMENT & PLAN:   Principal Problem:   Non-STEMI (non-ST elevated myocardial infarction) ([place]) Active Problems:   Aspiration pneumonia ([place])   Essential hypertension    Dyslipidemia   HFrEF (heart failure with reduced ejection fraction) (H[facility])   Hypokalemia   Type 2 diabetes mellitus with stage 3a chronic kidney disease, without long-term current use of insulin (HCC)   Non-STEMI (non-ST elevated myocardial infarction) ([place]) Persistent chest pain, DDx most likely cardiac however would include musculoskeletal pain Continue IV heparin drip High potency statin As needed SL nitro and IV morphine, added Tylenol Cardiology taking him to Cath Lab today  HFrEF (heart failure with reduced ejection fraction) ([place]) last known EF 20 to 25% as of 07/2021 Essential hypertension continue Aldactone, Entresto, Imdur, Coreg and [place]. Lasix 20 mg p.o. daily Discontinued continuous IV fluids  Aspiration pneumonia (HCC) History of COPD, not currently in exacerbation Tobacco abuse/dependence IV ampicillin-sulbactam and azithromycin - 07/18/2022 is day 2 total of antibiotics (received ceftriaxone and metronidazole in the ED) follow blood cultures. Mucolytics Bronchodilators: Albuterol nebulizer every 6 hours as needed, Nicotine replacement as needed  Dyslipidemia high-dose statin therapy. obtain fasting lipids.  Type 2 diabetes mellitus with stage 3a chronic kidney disease, without long-term current use of insulin (HCC) supplement coverage with NovoLog. continue his [place].  Hypokalemia Potassium will be replaced and magnesium level will be checked Follow-up BMP  Mental health condition, unspecified Continue home lamotrigine, duloxetine Alprazolam as needed  DVT prophylaxis: Currently on heparin drip for NSTEMI treatment Pertinent IV fluids/nutrition: Currently on maintenance normal saline with potassium at 100 mL/h, will reduce this given history of CHF.  He is on carb modified diet Central lines / invasive devices: none  Code Status: FULL CODE Family Communication: none at this time   Disposition: inpatient, anticipate eventual discharge back to  SNF Operating Room Services needs: none at this time, pt from SNF Barriers to discharge / significant pending items: Actively treating medical conditions as above

## 2022-07-18 NOTE — Assessment & Plan Note (Signed)
-   Potassium will be replaced and magnesium level will be checked. ?

## 2022-07-18 NOTE — ED Notes (Signed)
Cardiologist at bedside. RN to give PO meds once available from pharmacy.

## 2022-07-18 NOTE — Progress Notes (Signed)
*  PRELIMINARY RESULTS* Echocardiogram 2D Echocardiogram has been performed.  Shawn Jennings 07/18/2022, 12:21 PM

## 2022-07-18 NOTE — Assessment & Plan Note (Signed)
-   We will continue Aldactone, Entresto, Imdur, Coreg and [place].

## 2022-07-18 NOTE — H&P (View-Only) (Signed)
Cardiology Consultation   Patient ID: Shawn Jennings MRN: [id number]; DOB: 24-Jan-1946  Admit date: 07/17/2022 Date of Consult: 07/18/2022  PCP:  Merryl Hacker No   [facility] Providers Cardiologist:  [name], MD        Patient Profile:   Shawn Jennings is a 76 y.o. male with a hx of CAD status post CABG in 1994 in [place], chronic chest pain, HFrEF secondary to ischemic cardiomyopathy, paroxysmal atrial fibrillation, aortic stenosis status post AVR, PAD status post left AKA, CKD stage III, DM 2, hypertension, hyperlipidemia, COPD, tobacco use, gastroesophageal reflux disease, and depression who is being seen 07/18/2022 for the evaluation of chest pain and elevated troponin at the request of Dr. Sidney Ace.  History of Present Illness:   [name] has a long history of chest pain over the years.  He was admitted to [facility] on 01/2019 with chest pain.  Stress testing was potentially high risk with large severe fixed inferior and inferior lateral defect that extends to the apex.  No ischemia noted.  EF 37%.  Echo with EF 40-45%.  He preferred to avoid cardiac catheterization he had been medically managed since.  He was admitted in 02/2020 with recurrent atypical chest pain that was worse with deep breathing.  Troponins were normal.  He was seen with and by our team with recommendations for follow-up echocardiogram which was not performed.  He underwent barium esophagram that showed small hiatal hernia.  Subsequent recommended echo was obtained in 03/2020 and demonstrated a newly reduced LV systolic function with an EF of 25-30%, global hypokinesis, mild LVH, grade 1 diastolic dysfunction, normal RV systolic function and ventricular cavity size, bioprosthetic aortic valve with normal function, borderline dilatation of the aortic root measuring 38 mm.  Subsequent right/left heart catheterization in 06/06/2020 showed severe underlying coronary disease with patent grafts in the right heart cath with  normal filling pressures.  He was admitted in 09/2020 with choledocholithiasis and underwent ERCP.  He was seen in 10/2020 in the ED with chest pain.  He was admitted in 06/2021 with a upper GI bleed requiring 2 units of packed red blood cells.  EGD performed revealed erosive gastropathy with no acute bleeding.  Colonoscopy showed nonbleeding hemorrhoids.  GI did clear the patient to resume his Eliquis.  He was admitted in 07/2021 with COVID and acute on chronic HFrEF.  With diuresis he had symptomatic improvement and was discharged.  He was admitted on 11/08/2021 with sudden onset of chest pain and dyspnea at rest that began early that morning.  He had reported needed to take 1 sublingual nitro on a daily basis.  EKG did demonstrate slightly more pronounced inferior lateral ST changes when compared to prior tracings.  His high-sensitivity troponin of 283.  BNP 2623.  Hemoglobin low though largely stable at 9.8.  Chest x-ray showed right larger than left pleural effusion, potentially suspicious for pneumonia.  Overnight due to chest pain he did require nitro drip.  He had been diuresed with -1.1 L for the admission.  Once his weight was down a little over a kilogram.  Resolution of his chest pain and significant improvement in his dyspnea.  He was subsequently discharged back to the facility.  He presents to the [facility] emergency department on 07/17/2022 with complaint of chest pain.  He arrived via EMS from [place] for complaint of sudden onset substernal chest pain.  Received Nitrostat x3 from EMS and had 1 inch Nitropaste and aspirin that was placed.  When evaluated into the emergency department he describes it as continuous chest pain, pain is sharp tearing, it radiates to his substernal area.  There is no radiation to the back.  He had nausea and an episode of emesis as well.  He states there were no aggravating or alleviating factors.  He has a longstanding history of chest pain but describes this as a  worsening pain.  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 Pt unaware of history but CT imaging 01/2019 consistent w/ AVR; b. 01/2019 Echo: Mild to mod AS. Mean grad 31mHg.   CAD (coronary artery disease)    [place] b. Reports multiple stress tests over the years w/o repeat cath; c. 01/2019 MV: large, sev, fixed inf and inflat defect extending to apex. No ischemia. EF 37%-->Med rx given atypical Ss and pt wishes.   CHF (congestive heart failure) (HCC)    CKD (chronic kidney disease), stage III (HCC)    COPD (chronic obstructive pulmonary disease) (HCC)    Depression    Essential hypertension    GERD (gastroesophageal reflux disease)    Hyperlipidemia LDL goal <70    Ischemic cardiomyopathy    a. 01/2019 Echo: EF 40-45%, impaired relaxation. Mildly dil LA. Sev mitral annular Ca2+. Mild to mod AS.   PAD (peripheral artery disease) ([facility]    a. 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   PAF (paroxysmal atrial fibrillation) (HCC)    a. CHA2DS2VASc = 4-->Xarelto '15mg'$  daily in setting of CKD.   Thyrotoxicosis    Tobacco abuse    Type II diabetes mellitus ([place]     Past  Surgical History:  Procedure Laterality Date   CARDIAC CATHETERIZATION     CHOLECYSTECTOMY     COLONOSCOPY     COLONOSCOPY WITH PROPOFOL N/A 07/20/2021   Procedure: COLONOSCOPY WITH PROPOFOL;  Surgeon: VLin Landsman MD;  [facility];  Service: Gastroenterology;  Laterality: N/A;   CORONARY ARTERY BYPASS GRAFT     a. [place]  ENDOSCOPIC RETROGRADE CHOLANGIOPANCREATOGRAPHY (ERCP) WITH PROPOFOL N/A 10/19/2020   Procedure: ENDOSCOPIC RETROGRADE CHOLANGIOPANCREATOGRAPHY (ERCP) WITH PROPOFOL;  Surgeon: WLucilla Lame MD;  Location: [facility] ENDOSCOPY;  Service: Endoscopy;  Laterality: N/A;   ERCP N/A 12/29/2020   Procedure: ENDOSCOPIC RETROGRADE CHOLANGIOPANCREATOGRAPHY (ERCP);  Surgeon: WLucilla Lame MD;  [facility];  Service: Endoscopy;  Laterality: N/A;   ESOPHAGOGASTRODUODENOSCOPY (EGD) WITH PROPOFOL N/A 07/18/2021   Procedure: ESOPHAGOGASTRODUODENOSCOPY (EGD) WITH PROPOFOL;  Surgeon: VLin Landsman MD;  [facility];  Service: Gastroenterology;  Laterality: N/A;   Left AKA     RIGHT/LEFT HEART CATH AND CORONARY ANGIOGRAPHY N/A 06/06/2020   Procedure: RIGHT/LEFT HEART CATH AND CORONARY ANGIOGRAPHY;  Surgeon:  Wellington Hampshire, MD;  Location: [facility];  Service: Cardiovascular;  Laterality: N/A;     Home Medications:  Prior to Admission medications   Medication Sig Start Date End Date Taking? Authorizing Provider  ACETAMINOPHEN EXTRA STRENGTH 500 MG capsule Take 1 capsule by mouth every 6 (six) hours as needed. 01/01/22  Yes [provider]  apixaban (ELIQUIS) 5 MG TABS tablet Take 1 tablet (5 mg total) by mouth 2 (two) times daily. 07/22/21  Yes Wieting, Richard, MD  atorvastatin (LIPITOR) 80 MG tablet Take 80 mg by mouth at bedtime.   Yes [provider]  carvedilol (COREG) 3.125 MG tablet Take 1 tablet (3.125 mg total) by mouth 2 (two) times daily. 08/23/21  Yes Hackney, Otila Kluver A, FNP  D 1000 25 MCG (1000 UT)  capsule Take 1 capsule by mouth daily. 10/25/21  Yes [provider]  dapagliflozin propanediol (FARXIGA) 10 MG TABS tablet Take 1 tablet (10 mg total) by mouth daily. 11/23/21  Yes Hackney, Otila Kluver A, FNP  DULoxetine (CYMBALTA) 30 MG capsule Take 30 mg by mouth 2 (two) times daily.   Yes [provider]  ferrous sulfate 325 (65 FE) MG tablet Take 325 mg by mouth every other day.   Yes [provider]  isosorbide mononitrate (IMDUR) 30 MG 24 hr tablet Take 1 tablet (30 mg total) by mouth daily. 03/11/20 05/27/29 Yes Sharen Hones, MD  lamoTRIgine (LAMICTAL) 25 MG tablet Take 25 mg by mouth 2 (two) times daily.   Yes [provider]  loratadine (CLARITIN) 10 MG tablet Take 10 mg by mouth daily.   Yes [provider]  Melatonin 3 MG TABS Take 5 mg by mouth at bedtime.   Yes [provider]  montelukast (SINGULAIR) 10 MG tablet Take 10 mg by mouth at bedtime.   Yes [provider]  Multiple Vitamins-Minerals (PRESERVISION AREDS) CAPS Take 1 capsule by mouth in the morning and at bedtime.   Yes [provider]  nitroGLYCERIN (NITROSTAT) 0.4 MG SL tablet Place 0.4 mg under the tongue every 5 (five) minutes as needed for chest pain.   Yes [provider]  pantoprazole (PROTONIX) 40 MG tablet Take 40 mg by mouth daily.   Yes [provider]  sacubitril-valsartan (ENTRESTO) 24-26 MG Take 1 tablet by mouth 2 (two) times daily.   Yes [provider]  spironolactone (ALDACTONE) 25 MG tablet Take 1 tablet (25 mg total) by mouth daily. 06/11/22  Yes Hackney, Otila Kluver A, FNP  vitamin B-12 (CYANOCOBALAMIN) 1000 MCG tablet Take 1,000 mcg by mouth daily.   Yes [provider]  albuterol (VENTOLIN HFA) 108 (90 Base) MCG/ACT inhaler Inhale 2 puffs into the lungs every 6 (six) hours as needed for wheezing or shortness of breath.     [provider]  BIOFREEZE COOL THE PAIN 4 % GEL Apply 1 Application topically 3 (three)  times daily as needed. Apply to right shoulder three times daily as needed for pain 02/26/22   [provider]  furosemide (LASIX) 20 MG tablet Take 1 tablet (20 mg total) by mouth daily. 11/10/21   Jennye Boroughs, MD  hydrOXYzine (ATARAX/VISTARIL) 25 MG tablet Take 25 mg by mouth every 6 (six) hours as needed (allergy symptoms).    [provider]  potassium chloride (KLOR-CON) 10 MEQ tablet Take 1 tablet (10 mEq total) by mouth daily. Patient not taking: Reported on 07/17/2022 11/23/21   Alisa Graff, FNP    Inpatient Medications: Scheduled  Meds:  aspirin EC  81 mg Oral Daily   atorvastatin  80 mg Oral QHS   carvedilol  3.125 mg Oral BID WC   cholecalciferol  1,000 Units Oral Daily   cyanocobalamin  1,000 mcg Oral Daily   dapagliflozin propanediol  10 mg Oral Daily   DULoxetine  30 mg Oral BID   [START ON 07/19/2022] ferrous sulfate  325 mg Oral QODAY   furosemide  20 mg Oral Daily   insulin aspart  0-5 Units Subcutaneous QHS   insulin aspart  0-9 Units Subcutaneous TID WC   isosorbide mononitrate  30 mg Oral Daily   lamoTRIgine  25 mg Oral BID   loratadine  10 mg Oral Daily   melatonin  5 mg Oral QHS   montelukast  10 mg Oral QHS   multivitamin-lutein  1 capsule Oral Daily   nitroGLYCERIN  1 inch Topical Q6H   pantoprazole  40 mg Oral Daily   sacubitril-valsartan  1 tablet Oral BID   spironolactone  25 mg Oral Daily   Continuous Infusions:  ampicillin-sulbactam (UNASYN) IV Stopped (07/18/22 0700)   azithromycin Stopped (07/18/22 0253)   heparin 750 Units/hr (07/18/22 0047)   PRN Meds: acetaminophen, albuterol, ALPRAZolam, guaiFENesin-dextromethorphan, hydrOXYzine, magnesium hydroxide, morphine injection, nitroGLYCERIN, ondansetron (ZOFRAN) IV, traZODone  Allergies:   No Known Allergies  Social History:   Social History   Socioeconomic History   Marital status: Single    Spouse name: Not on file   Number of children: Not on file   Years of education:  Not on file   Highest education level: Not on file  Occupational History   Not on file  Tobacco Use   Smoking status: Every Day    Packs/day: 1.00    Years: 62.00    Total pack years: 62.00    Types: Cigarettes   Smokeless tobacco: Never  Vaping Use   Vaping Use: Never used  Substance and Sexual Activity   Alcohol use: Never   Drug use: Never   Sexual activity: Not on file  Other Topics Concern   Not on file  Social History Narrative   Retired from [employer].  From [place], [place]. Moved to [place] ~ 2011 (pt initially said that he moved here in Feb 2020, but then said 9 yrs ago).   Social Determinants of Health   Financial Resource Strain: Not on file  Food Insecurity: Not on file  Transportation Needs: Not on file  Physical Activity: Not on file  Stress: Not on file  Social Connections: Not on file  Intimate Partner Violence: Not on file    Family History:    Family History  Problem Relation Age of Onset   Heart attack Mother        died @ 53.   Other Father        never knew his father.   Other Sister        overdose of sleeping pills.   Heart disease Brother        died in his 35's   Other Sister        complication of abd surgery @ 64   CAD Sister      ROS:  Please see the history of present illness.  Review of Systems  Respiratory:  Positive for shortness of breath.   Cardiovascular:  Positive for chest pain.    All other ROS reviewed and negative.     Physical Exam/Data:   Vitals:   07/18/22 0530 07/18/22  0700 07/18/22 0754 07/18/22 0830  BP: (!) 151/93 (!) 152/93  (!) 155/89  Pulse: 71 75  77  Resp: (!) '21 14  19  '$ Temp:   98.2 F (36.8 C)   TempSrc:   Oral   SpO2: 99% 100%  92%  Weight:      Height:        Intake/Output Summary (Last 24 hours) at 07/18/2022 1004 Last data filed at 07/18/2022 0756 Gross per 24 hour  Intake 100 ml  Output 550 ml  Net -450 ml      07/17/2022    9:50 PM 11/10/2021    5:00 AM 11/09/2021   12:59 AM  Last 3  Weights  Weight (lbs) 138 lb 3.7 oz 133 lb 13.1 oz 140 lb 10.5 oz  Weight (kg) 62.7 kg 60.7 kg 63.8 kg     Body mass index is 18.75 kg/m.  General:  Well nourished, well developed, in mild distress HEENT: normal Neck: no JVD Vascular: No carotid bruits; Distal pulses 2+ bilaterally Cardiac:  normal S1, S2; RRR; I-II/VI systolic murmur without rubs or gallops Lungs: Diminished to auscultation bilaterally, no wheezing, rhonchi or rales, respirations are unlabored at rest on room air Abd: soft, nontender, no hepatomegaly  Ext: no edema, left BKA Musculoskeletal:  No deformities, BUE and BLE strength normal and equal Skin: warm and dry  Neuro:  CNs 2-12 intact, no focal abnormalities noted Psych:  Normal affect   EKG:  The EKG was personally reviewed and demonstrates: Sinus rhythm rate 83 unifocal PVCs, IVCD, first-degree AV block Telemetry:  Telemetry was personally reviewed and demonstrates: Sinus rhythm PVCs rate of 60-70  Relevant CV Studies: TTE 08/03/21  1. Left ventricular ejection fraction, by estimation, is 20 to 25%. The  left ventricle has severely decreased function. The left ventricle  demonstrates severe global hypokinesis, anterior wall motion best  preserved. The left ventricular internal cavity  size was moderately dilated.   2. Right ventricular systolic function is normal. The right ventricular  size is normal.   3. Left atrial size was moderately dilated.   4. The mitral valve is normal in structure. Mild mitral valve  regurgitation.   5. The aortic valve was not well visualized.   R/LHC 06/06/22 Prox Cx to Mid Cx lesion is 100% stenosed. Mid LAD lesion is 100% stenosed. Prox RCA lesion is 100% stenosed. LIMA graft was visualized by non-selective angiography and is normal in caliber. The graft exhibits no disease. RIMA graft was visualized by non-selective angiography and is normal in caliber. The graft exhibits no disease.   1.  Severe underlying  three-vessel coronary artery disease with patent grafts including LIMA to LAD, SVG Y graft to second diagonal and OM 3 and RIMA to RCA. 2.  Left ventricular angiography was not performed.  EF was severely reduced by echo. 3.  Right heart catheterization showed normal filling pressures, minimal pulmonary hypertension and severely reduced cardiac output.   Diagnostic Dominance: Right    Laboratory Data:  High Sensitivity Troponin:   Recent Labs  Lab 07/17/22 2204 07/17/22 2324  TROPONINIHS 774* 1,889*     Chemistry Recent Labs  Lab 07/17/22 2204 07/17/22 2324 07/18/22 0850  NA 142  --  144  K 3.4*  --  4.0  CL 113*  --  114*  CO2 19*  --  21*  GLUCOSE 212*  --  145*  BUN 24*  --  20  CREATININE 1.62*  --  1.63*  CALCIUM 8.9  --  8.4*  MG  --  1.9  --   GFRNONAA 43*  --  43*  ANIONGAP 10  --  9    Recent Labs  Lab 07/17/22 2204  PROT 6.6  ALBUMIN 3.4*  AST 20  ALT 13  ALKPHOS 123  BILITOT 0.8   Lipids No results for input(s): "CHOL", "TRIG", "HDL", "LABVLDL", "LDLCALC", "CHOLHDL" in the last 168 hours.  Hematology Recent Labs  Lab 07/17/22 2204 07/18/22 0850  WBC 14.0* 14.2*  RBC 5.65 5.92*  HGB 14.2 14.8  HCT 45.9 48.6  MCV 81.2 82.1  MCH 25.1* 25.0*  MCHC 30.9 30.5  RDW 17.5* 18.0*  PLT 184 183   Thyroid No results for input(s): "TSH", "FREET4" in the last 168 hours.  BNPNo results for input(s): "BNP", "PROBNP" in the last 168 hours.  DDimer  Recent Labs  Lab 07/17/22 2204  DDIMER 1.21*     Radiology/Studies:  CT ABDOMEN PELVIS W CONTRAST  Result Date: 07/17/2022 CLINICAL DATA:  Epigastric pain EXAM: CT ABDOMEN AND PELVIS WITH CONTRAST TECHNIQUE: Multidetector CT imaging of the abdomen and pelvis was performed using the standard protocol following bolus administration of intravenous contrast. RADIATION DOSE REDUCTION: This exam was performed according to the departmental dose-optimization program which includes automated exposure control,  adjustment of the mA and/or kV according to patient size and/or use of iterative reconstruction technique. CONTRAST:  10m OMNIPAQUE IOHEXOL 350 MG/ML SOLN COMPARISON:  CT 07/15/2021, 10/17/2020 FINDINGS: Lower chest: Lung bases are degraded by motion. No consolidative process, see separately dictated chest CT. Aortic valve prosthesis. Hepatobiliary: No focal liver abnormality is seen. Status post cholecystectomy. No biliary dilatation. Pancreas: Unremarkable. No pancreatic ductal dilatation or surrounding inflammatory changes. Spleen: Normal in size without focal abnormality. Adrenals/Urinary Tract: Adrenal glands are within normal limits. Kidneys show no hydronephrosis. Subcentimeter hypodense renal lesions too small to further characterize. Partially exophytic upper pole lesion on the right measuring 1.9 cm, contains slightly dense complex area, series 4, image 26. Mild wall thickening along the right bladder wall with slightly asymmetric mucosal enhancement, series 4, image 75. Stomach/Bowel: The stomach is nonenlarged. No dilated small bowel. No acute bowel wall thickening. Postsurgical changes in the left upper quadrant with chronically dilated segment of bowel in the region but no obstruction. Negative appendix Vascular/Lymphatic: Advanced aortic atherosclerosis. No aneurysm. Chronically occluded left SFA stent incompletely visualized. No suspicious lymph nodes. Reproductive: Prostate is unremarkable. Other: Negative for pelvic effusion or free air. Musculoskeletal: No acute osseous abnormality. IMPRESSION: 1. No CT evidence for acute intra-abdominal or pelvic abnormality. 2. 19 mm partially exophytic complex cystic lesion upper pole right kidney. When the patient is clinically stable and able to follow directions and hold their breath (preferably as an outpatient) further evaluation with dedicated abdominal MRI should be considered. 3. Mild wall thickening with slight asymmetric mucosal enhancement at the  right posterior bladder wall, indeterminate for an area of infection or inflammation. 4. Other chronic findings as discussed above Electronically Signed   By: KDonavan FoilM.D.   On: 07/17/2022 23:16   CT Angio Chest PE W and/or Wo Contrast  Result Date: 07/17/2022 CLINICAL DATA:  Positive D-dimer with epigastric pain. EXAM: CT ANGIOGRAPHY CHEST WITH CONTRAST TECHNIQUE: Multidetector CT imaging of the chest was performed using the standard protocol during bolus administration of intravenous contrast. Multiplanar CT image reconstructions and MIPs were obtained to evaluate the vascular anatomy. RADIATION DOSE REDUCTION: This exam was performed according to the departmental dose-optimization program which includes automated exposure control,  adjustment of the mA and/or kV according to patient size and/or use of iterative reconstruction technique. CONTRAST:  62m OMNIPAQUE IOHEXOL 350 MG/ML SOLN COMPARISON:  CT angiogram chest 08/03/2021 FINDINGS: Cardiovascular: There is mild aneurysmal dilatation of the ascending aorta measuring 4.1 cm which is unchanged. There are atherosclerotic calcifications of the aorta. Aortic valve replacement is present. The heart is mildly enlarged. There is no pericardial effusion. There are atherosclerotic calcifications of the aorta and coronary arteries. There is adequate opacification of the pulmonary arteries to the segmental level. No pulmonary embolism identified. Mediastinum/Nodes: No enlarged mediastinal, hilar, or axillary lymph nodes. Thyroid gland, trachea, and esophagus demonstrate no significant findings. Lungs/Pleura: There are minimal patchy airspace opacities in the right lower lobe. There is right lower lobe peribronchial wall thickening and plugging of bronchi similar to prior study. Right pleural effusion has resolved in the interval. There is minimal left lower lobe atelectasis. There is no evidence for pneumothorax. Upper Abdomen: Cholecystectomy clips are present.  Musculoskeletal: No chest wall abnormality. No acute or significant osseous findings. Sternotomy wires are present. Review of the MIP images confirms the above findings. IMPRESSION: 1. No evidence for pulmonary embolism. 2. 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 Recommend annual imaging followup by CTA or MRA. This recommendation follows 2010 ACCF/AHA/AATS/ACR/ASA/SCA/SCAI/SIR/STS/SVM Guidelines for the Diagnosis and Management of Patients with Thoracic Aortic Disease. Circulation. 2010; 121:: [id number] Aortic aneurysm NOS (ICD10-I71.9) 3. Right lower lobe peribronchial wall thickening, plugging of bronchi and minimal airspace disease compatible with infectious/inflammatory process. Correlate clinically for aspiration. 4. Mild cardiomegaly. 5.  Aortic Atherosclerosis (ICD10-I70.0). Electronically Signed   By: ARonney AstersM.D.   On: 07/17/2022 23:13   DG Chest Port 1 View  Result Date: 07/17/2022 CLINICAL DATA:  Chest pain EXAM: PORTABLE CHEST 1 VIEW COMPARISON:  11/08/2021 FINDINGS: Prior CABG. Heart and mediastinal contours are within normal limits. No focal opacities or effusions. No acute bony abnormality. IMPRESSION: No active cardiopulmonary disease. Electronically Signed   By: KRolm BaptiseM.D.   On: 07/17/2022 22:14     Assessment and Plan:   NSTEMI/coronary artery disease status post CABG -Continued with complaints of chest pain 6 and 7 out of 10 after receiving nitro fentanyl and morphine -High-sensitivity troponins trended 774 and 1889 -Continued on IV heparin -Started on nitro paste -With continued chest pain, EKG changes, and elevated high-sensitivity troponins patient will receive IV hydration and planned elective heart catheterization this afternoon -Remain n.p.o. -Continue cardiac monitoring -Continued on aspirin and high intensity statin as well as long acting nitrate of Imdur -EKG as needed changes and pain  Essential hypertension -Blood pressure  155/89 -Continued on PTA medications -Vital signs per unit protocol  Dyslipidemia -Continued on high intensity statin  HFrEF -Denies any current shortness of breath -Currently maintaining sats on room air -LVEF 20-25% 07/2021 -Continued on goal-directed medical therapy Coreg, Farxiga, furosemide, Entresto, spironolactone -Daily weight, I&O, low-sodium diet  Type 2 diabetes -He has been continued on Farxiga -Sliding scale insulin -Management per IM  Paroxysmal atrial fibrillation -Maintaining sinus rhythm -CHA2DS2-VASc of at least 6 -Heparin drip for now -Resume PTA Eliquis prior to discharge -Continued on carvedilol   7.   AKI -serum creatinine 1.63 -baseline serum creatinine 1.3-1.45 -daily bmp -monitor I&O -monitor/trend/replete electrolytes as needed -avoid nephrotoxic agents where able  Shared Decision Making/Informed Consent The risks [stroke (1 in 1000), death (1 in 1000), kidney failure [usually temporary] (1 in 500), bleeding (1 in 200), allergic reaction [possibly serious] (1 in  200)], benefits (diagnostic support and management of coronary artery disease) and alternatives of a cardiac catheterization were discussed in detail with Mr. Jenkinson and he is willing to proceed.  Risk Assessment/Risk Scores:     TIMI Risk Score for Unstable Angina or Non-ST Elevation MI:   The patient's TIMI risk score is 7, which indicates a 41% risk of all cause mortality, new or recurrent myocardial infarction or need for urgent revascularization in the next 14 days.  New York Heart Association (NYHA) Functional Class NYHA Class II  CHA2DS2-VASc Score = 6   This indicates a 9.7% annual risk of stroke. The patient's score is based upon: CHF History: 1 HTN History: 1 Diabetes History: 1 Stroke History: 0 Vascular Disease History: 1 Age Score: 2 Gender Score: 0         For questions or updates, please contact [facility] Please consult [email or web address] for contact  info under    Signed, Garan Frappier, NP  07/18/2022 10:04 AM

## 2022-07-18 NOTE — Assessment & Plan Note (Signed)
-   The patient will be placed on supplement coverage with NovoLog. - We will continue his [place].

## 2022-07-18 NOTE — Assessment & Plan Note (Signed)
-   The patient will be admitted to a progressive unit bed. - We will continue on IV heparin drip. - He will be resumed on high-dose statin. - We will place him on as needed sublingual nitroglycerin and IV morphine sulfate for pain. - 2D echo and cardiology consult will be obtained. - I notified Dr. Oval Linsey about the patient.

## 2022-07-18 NOTE — ED Notes (Signed)
NTG paste not yet verified by pharmacy.

## 2022-07-18 NOTE — Assessment & Plan Note (Signed)
-   The patient will be continued on high-dose statin therapy. - We will obtain fasting lipids.

## 2022-07-18 NOTE — Consult Note (Signed)
Cardiology Consultation   Patient ID: Nole Robey MRN: [id number]; DOB: 08-09-1946  Admit date: 07/17/2022 Date of Consult: 07/18/2022  PCP:  Merryl Hacker No   [facility] Providers Cardiologist:  [name], MD        Patient Profile:   Shawn Jennings is a 76 y.o. male with a hx of CAD status post CABG in 1994 in [place], chronic chest pain, HFrEF secondary to ischemic cardiomyopathy, paroxysmal atrial fibrillation, aortic stenosis status post AVR, PAD status post left AKA, CKD stage III, DM 2, hypertension, hyperlipidemia, COPD, tobacco use, gastroesophageal reflux disease, and depression who is being seen 07/18/2022 for the evaluation of chest pain and elevated troponin at the request of Dr. Sidney Ace.  History of Present Illness:   Mr. Kirsh has a long history of chest pain over the years.  He was admitted to [facility] on 01/2019 with chest pain.  Stress testing was potentially high risk with large severe fixed inferior and inferior lateral defect that extends to the apex.  No ischemia noted.  EF 37%.  Echo with EF 40-45%.  He preferred to avoid cardiac catheterization he had been medically managed since.  He was admitted in 02/2020 with recurrent atypical chest pain that was worse with deep breathing.  Troponins were normal.  He was seen with and by our team with recommendations for follow-up echocardiogram which was not performed.  He underwent barium esophagram that showed small hiatal hernia.  Subsequent recommended echo was obtained in 03/2020 and demonstrated a newly reduced LV systolic function with an EF of 25-30%, global hypokinesis, mild LVH, grade 1 diastolic dysfunction, normal RV systolic function and ventricular cavity size, bioprosthetic aortic valve with normal function, borderline dilatation of the aortic root measuring 38 mm.  Subsequent right/left heart catheterization in 06/06/2020 showed severe underlying coronary disease with patent grafts in the right heart cath with  normal filling pressures.  He was admitted in 09/2020 with choledocholithiasis and underwent ERCP.  He was seen in 10/2020 in the ED with chest pain.  He was admitted in 06/2021 with a upper GI bleed requiring 2 units of packed red blood cells.  EGD performed revealed erosive gastropathy with no acute bleeding.  Colonoscopy showed nonbleeding hemorrhoids.  GI did clear the patient to resume his Eliquis.  He was admitted in 07/2021 with COVID and acute on chronic HFrEF.  With diuresis he had symptomatic improvement and was discharged.  He was admitted on 11/08/2021 with sudden onset of chest pain and dyspnea at rest that began early that morning.  He had reported needed to take 1 sublingual nitro on a daily basis.  EKG did demonstrate slightly more pronounced inferior lateral ST changes when compared to prior tracings.  His high-sensitivity troponin of 283.  BNP 2623.  Hemoglobin low though largely stable at 9.8.  Chest x-ray showed right larger than left pleural effusion, potentially suspicious for pneumonia.  Overnight due to chest pain he did require nitro drip.  He had been diuresed with -1.1 L for the admission.  Once his weight was down a little over a kilogram.  Resolution of his chest pain and significant improvement in his dyspnea.  He was subsequently discharged back to the facility.  He presents to the [facility] emergency department on 07/17/2022 with complaint of chest pain.  He arrived via EMS from [place] for complaint of sudden onset substernal chest pain.  Received Nitrostat x3 from EMS and had 1 inch Nitropaste and aspirin that was placed.  When evaluated into the emergency department he describes it as continuous chest pain, pain is sharp tearing, it radiates to his substernal area.  There is no radiation to the back.  He had nausea and an episode of emesis as well.  He states there were no aggravating or alleviating factors.  He has a longstanding history of chest pain but describes this as a  worsening pain.  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 Pt unaware of history but CT imaging 01/2019 consistent w/ AVR; b. 01/2019 Echo: Mild to mod AS. Mean grad 96mHg.   CAD (coronary artery disease)    a. [place] b. Reports multiple stress tests over the years w/o repeat cath; c. 01/2019 MV: large, sev, fixed inf and inflat defect extending to apex. No ischemia. EF 37%-->Med rx given atypical Ss and pt wishes.   CHF (congestive heart failure) (HCC)    CKD (chronic kidney disease), stage III (HCC)    COPD (chronic obstructive pulmonary disease) (HCC)    Depression    Essential hypertension    GERD (gastroesophageal reflux disease)    Hyperlipidemia LDL goal <70    Ischemic cardiomyopathy    a. 01/2019 Echo: EF 40-45%, impaired relaxation. Mildly dil LA. Sev mitral annular Ca2+. Mild to mod AS.   PAD ([place]ipheral artery disease) ([place]    a. 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   PAF (paroxysmal atrial fibrillation) (HCC)    a. CHA2DS2VASc = 4-->Xarelto '15mg'$  daily in setting of CKD.   Thyrotoxicosis    Tobacco abuse    Type II diabetes mellitus ([place]     Past  Surgical History:  Procedure Laterality Date   CARDIAC CATHETERIZATION     CHOLECYSTECTOMY     COLONOSCOPY     COLONOSCOPY WITH PROPOFOL N/A 07/20/2021   Procedure: COLONOSCOPY WITH PROPOFOL;  Surgeon: VLin Landsman MD;  [facility];  Service: Gastroenterology;  Laterality: N/A;   CORONARY ARTERY BYPASS GRAFT     a. [place]  ENDOSCOPIC RETROGRADE CHOLANGIOPANCREATOGRAPHY (ERCP) WITH PROPOFOL N/A 10/19/2020   Procedure: ENDOSCOPIC RETROGRADE CHOLANGIOPANCREATOGRAPHY (ERCP) WITH PROPOFOL;  Surgeon: WLucilla Lame MD;  Location: [facility] ENDOSCOPY;  Service: Endoscopy;  Laterality: N/A;   ERCP N/A 12/29/2020   Procedure: ENDOSCOPIC RETROGRADE CHOLANGIOPANCREATOGRAPHY (ERCP);  Surgeon: WLucilla Lame MD;  [facility];  Service: Endoscopy;  Laterality: N/A;   ESOPHAGOGASTRODUODENOSCOPY (EGD) WITH PROPOFOL N/A 07/18/2021   Procedure: ESOPHAGOGASTRODUODENOSCOPY (EGD) WITH PROPOFOL;  Surgeon: VLin Landsman MD;  [facility];  Service: Gastroenterology;  Laterality: N/A;   Left AKA     RIGHT/LEFT HEART CATH AND CORONARY ANGIOGRAPHY N/A 06/06/2020   Procedure: RIGHT/LEFT HEART CATH AND CORONARY ANGIOGRAPHY;  Surgeon:  Wellington Hampshire, MD;  Location: [facility];  Service: Cardiovascular;  Laterality: N/A;     Home Medications:  Prior to Admission medications   Medication Sig Start Date End Date Taking? Authorizing Provider  ACETAMINOPHEN EXTRA STRENGTH 500 MG capsule Take 1 capsule by mouth every 6 (six) hours as needed. 01/01/22  Yes [provider]  apixaban (ELIQUIS) 5 MG TABS tablet Take 1 tablet (5 mg total) by mouth 2 (two) times daily. 07/22/21  Yes Wieting, Richard, MD  atorvastatin (LIPITOR) 80 MG tablet Take 80 mg by mouth at bedtime.   Yes [provider]  carvedilol (COREG) 3.125 MG tablet Take 1 tablet (3.125 mg total) by mouth 2 (two) times daily. 08/23/21  Yes Hackney, Otila Kluver A, FNP  D 1000 25 MCG (1000 UT)  capsule Take 1 capsule by mouth daily. 10/25/21  Yes [provider]  dapagliflozin propanediol (FARXIGA) 10 MG TABS tablet Take 1 tablet (10 mg total) by mouth daily. 11/23/21  Yes Hackney, Otila Kluver A, FNP  DULoxetine (CYMBALTA) 30 MG capsule Take 30 mg by mouth 2 (two) times daily.   Yes [provider]  ferrous sulfate 325 (65 FE) MG tablet Take 325 mg by mouth every other day.   Yes [provider]  isosorbide mononitrate (IMDUR) 30 MG 24 hr tablet Take 1 tablet (30 mg total) by mouth daily. 03/11/20 05/27/29 Yes Sharen Hones, MD  lamoTRIgine (LAMICTAL) 25 MG tablet Take 25 mg by mouth 2 (two) times daily.   Yes [provider]  loratadine (CLARITIN) 10 MG tablet Take 10 mg by mouth daily.   Yes [provider]  Melatonin 3 MG TABS Take 5 mg by mouth at bedtime.   Yes [provider]  montelukast (SINGULAIR) 10 MG tablet Take 10 mg by mouth at bedtime.   Yes [provider]  Multiple Vitamins-Minerals (PRESERVISION AREDS) CAPS Take 1 capsule by mouth in the morning and at bedtime.   Yes [provider]  nitroGLYCERIN (NITROSTAT) 0.4 MG SL tablet Place 0.4 mg under the tongue every 5 (five) minutes as needed for chest pain.   Yes [provider]  pantoprazole (PROTONIX) 40 MG tablet Take 40 mg by mouth daily.   Yes [provider]  sacubitril-valsartan (ENTRESTO) 24-26 MG Take 1 tablet by mouth 2 (two) times daily.   Yes [provider]  spironolactone (ALDACTONE) 25 MG tablet Take 1 tablet (25 mg total) by mouth daily. 06/11/22  Yes Hackney, Otila Kluver A, FNP  vitamin B-12 (CYANOCOBALAMIN) 1000 MCG tablet Take 1,000 mcg by mouth daily.   Yes [provider]  albuterol (VENTOLIN HFA) 108 (90 Base) MCG/ACT inhaler Inhale 2 puffs into the lungs every 6 (six) hours as needed for wheezing or shortness of breath.     [provider]  BIOFREEZE COOL THE PAIN 4 % GEL Apply 1 Application topically 3 (three)  times daily as needed. Apply to right shoulder three times daily as needed for pain 02/26/22   [provider]  furosemide (LASIX) 20 MG tablet Take 1 tablet (20 mg total) by mouth daily. 11/10/21   Jennye Boroughs, MD  hydrOXYzine (ATARAX/VISTARIL) 25 MG tablet Take 25 mg by mouth every 6 (six) hours as needed (allergy symptoms).    [provider]  potassium chloride (KLOR-CON) 10 MEQ tablet Take 1 tablet (10 mEq total) by mouth daily. Patient not taking: Reported on 07/17/2022 11/23/21   Alisa Graff, FNP    Inpatient Medications: Scheduled  Meds:  aspirin EC  81 mg Oral Daily   atorvastatin  80 mg Oral QHS   carvedilol  3.125 mg Oral BID WC   cholecalciferol  1,000 Units Oral Daily   cyanocobalamin  1,000 mcg Oral Daily   dapagliflozin propanediol  10 mg Oral Daily   DULoxetine  30 mg Oral BID   [START ON 07/19/2022] ferrous sulfate  325 mg Oral QODAY   furosemide  20 mg Oral Daily   insulin aspart  0-5 Units Subcutaneous QHS   insulin aspart  0-9 Units Subcutaneous TID WC   isosorbide mononitrate  30 mg Oral Daily   lamoTRIgine  25 mg Oral BID   loratadine  10 mg Oral Daily   melatonin  5 mg Oral QHS   montelukast  10 mg Oral QHS   multivitamin-lutein  1 capsule Oral Daily   nitroGLYCERIN  1 inch Topical Q6H   pantoprazole  40 mg Oral Daily   sacubitril-valsartan  1 tablet Oral BID   spironolactone  25 mg Oral Daily   Continuous Infusions:  ampicillin-sulbactam (UNASYN) IV Stopped (07/18/22 0700)   azithromycin Stopped (07/18/22 0253)   heparin 750 Units/hr (07/18/22 0047)   PRN Meds: acetaminophen, albuterol, ALPRAZolam, guaiFENesin-dextromethorphan, hydrOXYzine, magnesium hydroxide, morphine injection, nitroGLYCERIN, ondansetron (ZOFRAN) IV, traZODone  Allergies:   No Known Allergies  Social History:   Social History   Socioeconomic History   Marital status: Single    Spouse name: Not on file   Number of children: Not on file   Years of education:  Not on file   Highest education level: Not on file  Occupational History   Not on file  Tobacco Use   Smoking status: Every Day    Packs/day: 1.00    Years: 62.00    Total pack years: 62.00    Types: Cigarettes   Smokeless tobacco: Never  Vaping Use   Vaping Use: Never used  Substance and Sexual Activity   Alcohol use: Never   Drug use: Never   Sexual activity: Not on file  Other Topics Concern   Not on file  Social History Narrative   Retired from [employer].  From [place], [place]. Moved to [place] ~ 2011 (pt initially said that he moved here in Feb 2020, but then said 9 yrs ago).   Social Determinants of Health   Financial Resource Strain: Not on file  Food Insecurity: Not on file  Transportation Needs: Not on file  Physical Activity: Not on file  Stress: Not on file  Social Connections: Not on file  Intimate Partner Violence: Not on file    Family History:    Family History  Problem Relation Age of Onset   Heart attack Mother        died @ 63.   Other Father        never knew his father.   Other Sister        overdose of sleeping pills.   Heart disease Brother        died in his 19's   Other Sister        complication of abd surgery @ 20   CAD Sister      ROS:  Please see the history of present illness.  Review of Systems  Respiratory:  Positive for shortness of breath.   Cardiovascular:  Positive for chest pain.    All other ROS reviewed and negative.     Physical Exam/Data:   Vitals:   07/18/22 0530 07/18/22  0700 07/18/22 0754 07/18/22 0830  BP: (!) 151/93 (!) 152/93  (!) 155/89  Pulse: 71 75  77  Resp: (!) '21 14  19  '$ Temp:   98.2 F (36.8 C)   TempSrc:   Oral   SpO2: 99% 100%  92%  Weight:      Height:        Intake/Output Summary (Last 24 hours) at 07/18/2022 1004 Last data filed at 07/18/2022 0756 Gross per 24 hour  Intake 100 ml  Output 550 ml  Net -450 ml      07/17/2022    9:50 PM 11/10/2021    5:00 AM 11/09/2021   12:59 AM  Last 3  Weights  Weight (lbs) 138 lb 3.7 oz 133 lb 13.1 oz 140 lb 10.5 oz  Weight (kg) 62.7 kg 60.7 kg 63.8 kg     Body mass index is 18.75 kg/m.  General:  Well nourished, well developed, in mild distress HEENT: normal Neck: no JVD Vascular: No carotid bruits; Distal pulses 2+ bilaterally Cardiac:  normal S1, S2; RRR; I-II/VI systolic murmur without rubs or gallops Lungs: Diminished to auscultation bilaterally, no wheezing, rhonchi or rales, respirations are unlabored at rest on room air Abd: soft, nontender, no hepatomegaly  Ext: no edema, left BKA Musculoskeletal:  No deformities, BUE and BLE strength normal and equal Skin: warm and dry  Neuro:  CNs 2-12 intact, no focal abnormalities noted Psych:  Normal affect   EKG:  The EKG was personally reviewed and demonstrates: Sinus rhythm rate 83 unifocal PVCs, IVCD, first-degree AV block Telemetry:  Telemetry was personally reviewed and demonstrates: Sinus rhythm PVCs rate of 60-70  Relevant CV Studies: TTE 08/03/21  1. Left ventricular ejection fraction, by estimation, is 20 to 25%. The  left ventricle has severely decreased function. The left ventricle  demonstrates severe global hypokinesis, anterior wall motion best  preserved. The left ventricular internal cavity  size was moderately dilated.   2. Right ventricular systolic function is normal. The right ventricular  size is normal.   3. Left atrial size was moderately dilated.   4. The mitral valve is normal in structure. Mild mitral valve  regurgitation.   5. The aortic valve was not well visualized.   R/LHC 06/06/22 Prox Cx to Mid Cx lesion is 100% stenosed. Mid LAD lesion is 100% stenosed. Prox RCA lesion is 100% stenosed. LIMA graft was visualized by non-selective angiography and is normal in caliber. The graft exhibits no disease. RIMA graft was visualized by non-selective angiography and is normal in caliber. The graft exhibits no disease.   1.  Severe underlying  three-vessel coronary artery disease with patent grafts including LIMA to LAD, SVG Y graft to second diagonal and OM 3 and RIMA to RCA. 2.  Left ventricular angiography was not performed.  EF was severely reduced by echo. 3.  Right heart catheterization showed normal filling pressures, minimal pulmonary hypertension and severely reduced cardiac output.   Diagnostic Dominance: Right    Laboratory Data:  High Sensitivity Troponin:   Recent Labs  Lab 07/17/22 2204 07/17/22 2324  TROPONINIHS 774* 1,889*     Chemistry Recent Labs  Lab 07/17/22 2204 07/17/22 2324 07/18/22 0850  NA 142  --  144  K 3.4*  --  4.0  CL 113*  --  114*  CO2 19*  --  21*  GLUCOSE 212*  --  145*  BUN 24*  --  20  CREATININE 1.62*  --  1.63*  CALCIUM 8.9  --  8.4*  MG  --  1.9  --   GFRNONAA 43*  --  43*  ANIONGAP 10  --  9    Recent Labs  Lab 07/17/22 2204  PROT 6.6  ALBUMIN 3.4*  AST 20  ALT 13  ALKPHOS 123  BILITOT 0.8   Lipids No results for input(s): "CHOL", "TRIG", "HDL", "LABVLDL", "LDLCALC", "CHOLHDL" in the last 168 hours.  Hematology Recent Labs  Lab 07/17/22 2204 07/18/22 0850  WBC 14.0* 14.2*  RBC 5.65 5.92*  HGB 14.2 14.8  HCT 45.9 48.6  MCV 81.2 82.1  MCH 25.1* 25.0*  MCHC 30.9 30.5  RDW 17.5* 18.0*  PLT 184 183   Thyroid No results for input(s): "TSH", "FREET4" in the last 168 hours.  BNPNo results for input(s): "BNP", "PROBNP" in the last 168 hours.  DDimer  Recent Labs  Lab 07/17/22 2204  DDIMER 1.21*     Radiology/Studies:  CT ABDOMEN PELVIS W CONTRAST  Result Date: 07/17/2022 CLINICAL DATA:  Epigastric pain EXAM: CT ABDOMEN AND PELVIS WITH CONTRAST TECHNIQUE: Multidetector CT imaging of the abdomen and pelvis was performed using the standard protocol following bolus administration of intravenous contrast. RADIATION DOSE REDUCTION: This exam was performed according to the departmental dose-optimization program which includes automated exposure control,  adjustment of the mA and/or kV according to patient size and/or use of iterative reconstruction technique. CONTRAST:  68m OMNIPAQUE IOHEXOL 350 MG/ML SOLN COMPARISON:  CT 07/15/2021, 10/17/2020 FINDINGS: Lower chest: Lung bases are degraded by motion. No consolidative process, see separately dictated chest CT. Aortic valve prosthesis. Hepatobiliary: No focal liver abnormality is seen. Status post cholecystectomy. No biliary dilatation. Pancreas: Unremarkable. No pancreatic ductal dilatation or surrounding inflammatory changes. Spleen: Normal in size without focal abnormality. Adrenals/Urinary Tract: Adrenal glands are within normal limits. Kidneys show no hydronephrosis. Subcentimeter hypodense renal lesions too small to further characterize. Partially exophytic upper pole lesion on the right measuring 1.9 cm, contains slightly dense complex area, series 4, image 26. Mild wall thickening along the right bladder wall with slightly asymmetric mucosal enhancement, series 4, image 75. Stomach/Bowel: The stomach is nonenlarged. No dilated small bowel. No acute bowel wall thickening. Postsurgical changes in the left upper quadrant with chronically dilated segment of bowel in the region but no obstruction. Negative appendix Vascular/Lymphatic: Advanced aortic atherosclerosis. No aneurysm. Chronically occluded left SFA stent incompletely visualized. No suspicious lymph nodes. Reproductive: Prostate is unremarkable. Other: Negative for pelvic effusion or free air. Musculoskeletal: No acute osseous abnormality. IMPRESSION: 1. No CT evidence for acute intra-abdominal or pelvic abnormality. 2. 19 mm partially exophytic complex cystic lesion upper pole right kidney. When the patient is clinically stable and able to follow directions and hold their breath (preferably as an outpatient) further evaluation with dedicated abdominal MRI should be considered. 3. Mild wall thickening with slight asymmetric mucosal enhancement at the  right posterior bladder wall, indeterminate for an area of infection or inflammation. 4. Other chronic findings as discussed above Electronically Signed   By: KDonavan FoilM.D.   On: 07/17/2022 23:16   CT Angio Chest PE W and/or Wo Contrast  Result Date: 07/17/2022 CLINICAL DATA:  Positive D-dimer with epigastric pain. EXAM: CT ANGIOGRAPHY CHEST WITH CONTRAST TECHNIQUE: Multidetector CT imaging of the chest was performed using the standard protocol during bolus administration of intravenous contrast. Multiplanar CT image reconstructions and MIPs were obtained to evaluate the vascular anatomy. RADIATION DOSE REDUCTION: This exam was performed according to the departmental dose-optimization program which includes automated exposure control,  adjustment of the mA and/or kV according to patient size and/or use of iterative reconstruction technique. CONTRAST:  26m OMNIPAQUE IOHEXOL 350 MG/ML SOLN COMPARISON:  CT angiogram chest 08/03/2021 FINDINGS: Cardiovascular: There is mild aneurysmal dilatation of the ascending aorta measuring 4.1 cm which is unchanged. There are atherosclerotic calcifications of the aorta. Aortic valve replacement is present. The heart is mildly enlarged. There is no pericardial effusion. There are atherosclerotic calcifications of the aorta and coronary arteries. There is adequate opacification of the pulmonary arteries to the segmental level. No pulmonary embolism identified. Mediastinum/Nodes: No enlarged mediastinal, hilar, or axillary lymph nodes. Thyroid gland, trachea, and esophagus demonstrate no significant findings. Lungs/Pleura: There are minimal patchy airspace opacities in the right lower lobe. There is right lower lobe peribronchial wall thickening and plugging of bronchi similar to prior study. Right pleural effusion has resolved in the interval. There is minimal left lower lobe atelectasis. There is no evidence for pneumothorax. Upper Abdomen: Cholecystectomy clips are present.  Musculoskeletal: No chest wall abnormality. No acute or significant osseous findings. Sternotomy wires are present. Review of the MIP images confirms the above findings. IMPRESSION: 1. No evidence for pulmonary embolism. 2. 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 Recommend annual imaging followup by CTA or MRA. This recommendation follows 2010 ACCF/AHA/AATS/ACR/ASA/SCA/SCAI/SIR/STS/SVM Guidelines for the Diagnosis and Management of Patients with Thoracic Aortic Disease. Circulation. 2010; 121:: [id number] Aortic aneurysm NOS (ICD10-I71.9) 3. Right lower lobe peribronchial wall thickening, plugging of bronchi and minimal airspace disease compatible with infectious/inflammatory process. Correlate clinically for aspiration. 4. Mild cardiomegaly. 5.  Aortic Atherosclerosis (ICD10-I70.0). Electronically Signed   By: ARonney AstersM.D.   On: 07/17/2022 23:13   DG Chest Port 1 View  Result Date: 07/17/2022 CLINICAL DATA:  Chest pain EXAM: PORTABLE CHEST 1 VIEW COMPARISON:  11/08/2021 FINDINGS: Prior CABG. Heart and mediastinal contours are within normal limits. No focal opacities or effusions. No acute bony abnormality. IMPRESSION: No active cardiopulmonary disease. Electronically Signed   By: KRolm BaptiseM.D.   On: 07/17/2022 22:14     Assessment and Plan:   NSTEMI/coronary artery disease status post CABG -Continued with complaints of chest pain 6 and 7 out of 10 after receiving nitro fentanyl and morphine -High-sensitivity troponins trended 774 and 1889 -Continued on IV heparin -Started on nitro paste -With continued chest pain, EKG changes, and elevated high-sensitivity troponins patient will receive IV hydration and planned elective heart catheterization this afternoon -Remain n.p.o. -Continue cardiac monitoring -Continued on aspirin and high intensity statin as well as long acting nitrate of Imdur -EKG as needed changes and pain  Essential hypertension -Blood pressure  155/89 -Continued on PTA medications -Vital signs per unit protocol  Dyslipidemia -Continued on high intensity statin  HFrEF -Denies any current shortness of breath -Currently maintaining sats on room air -LVEF 20-25% 07/2021 -Continued on goal-directed medical therapy Coreg, Farxiga, furosemide, Entresto, spironolactone -Daily weight, I&O, low-sodium diet  Type 2 diabetes -He has been continued on Farxiga -Sliding scale insulin -Management per IM  Paroxysmal atrial fibrillation -Maintaining sinus rhythm -CHA2DS2-VASc of at least 6 -Heparin drip for now -Resume PTA Eliquis prior to discharge -Continued on carvedilol   7.   AKI -serum creatinine 1.63 -baseline serum creatinine 1.3-1.45 -daily bmp -monitor I&O -monitor/trend/replete electrolytes as needed -avoid nephrotoxic agents where able  Shared Decision Making/Informed Consent The risks [stroke (1 in 1000), death (1 in 1000), kidney failure [usually temporary] (1 in 500), bleeding (1 in 200), allergic reaction [possibly serious] (1 in  200)], benefits (diagnostic support and management of coronary artery disease) and alternatives of a cardiac catheterization were discussed in detail with Mr. Demond and he is willing to proceed.  Risk Assessment/Risk Scores:     TIMI Risk Score for Unstable Angina or Non-ST Elevation MI:   The patient's TIMI risk score is 7, which indicates a 41% risk of all cause mortality, new or recurrent myocardial infarction or need for urgent revascularization in the next 14 days.  New York Heart Association (NYHA) Functional Class NYHA Class II  CHA2DS2-VASc Score = 6   This indicates a 9.7% annual risk of stroke. The patient's score is based upon: CHF History: 1 HTN History: 1 Diabetes History: 1 Stroke History: 0 Vascular Disease History: 1 Age Score: 2 Gender Score: 0         For questions or updates, please contact [place] Please consult [email or web address] for contact  info under    Signed, Kealy Lewter, NP  07/18/2022 10:04 AM

## 2022-07-18 NOTE — Interval H&P Note (Signed)
History and Physical Interval Note:  07/18/2022 1:26 PM  Shawn Jennings  has presented today for surgery, with the diagnosis of NSTEMI.  The various methods of treatment have been discussed with the patient and family. After consideration of risks, benefits and other options for treatment, the patient has consented to  Procedure(s): RIGHT/LEFT HEART CATH AND CORONARY/GRAFT ANGIOGRAPHY (N/A) as a surgical intervention.  The patient's history has been reviewed, patient examined, no change in status, stable for surgery.  I have reviewed the patient's chart and labs.  Questions were answered to the patient's satisfaction.    Cath Lab Visit (complete for each Cath Lab visit)  Clinical Evaluation Leading to the Procedure:   ACS: Yes.    Non-ACS:  N/A   [name]

## 2022-07-18 NOTE — ED Notes (Signed)
Pt to cath lab.

## 2022-07-18 NOTE — Assessment & Plan Note (Signed)
-   We will continue his antihypertensives. 

## 2022-07-18 NOTE — ED Notes (Signed)
Cardiology NP was at bedside evaluating pt and talking with pt about plan of care.

## 2022-07-18 NOTE — ED Notes (Signed)
Lab called, they will need red top, lavender, lt green tubes in addition to blue top due at 0900.

## 2022-07-18 NOTE — Progress Notes (Signed)
PROGRESS NOTE    Shawn Jennings   VQM:[id number] DOB: Jan 04, 1946  DOA: 07/17/2022 Date of Service: 07/18/22 PCP: Merryl Hacker, No     Brief Narrative / Hospital Course:  Shawn Jennings is a 76 y.o. Caucasian male with medical history significant for coronary artery disease status post CABG, HFrEF (last echocardiogram on file 08/03/2021, LVEF 20 to 25% with severely decreased LV function, global hypokinesis, moderately dilated internal cavity, RV normal), stage III chronic kidney disease, COPD, depression, GERD, hypertension, dyslipidemia, ischemic Myopathy, paroxysmal atrial fibrillation on Eliquis, peripheral arterial disease and type 2 diabetes mellitus, who presented to the ER 07/17/22 with acute onset of substernal chest pain that was sharp and graded 7/10 in severity.  He had associated nausea and vomiting as well as mild diaphoresis. Received Nitro x3 from EMS, x1inch nitro paste and ASA.  09/26: In ED, RR 23, initial SPO2 91% on RA, increased to 93-100%.  Hypokalemia mild at 3.4, creatinine 1.62 slightly elevated above baseline, WBC 14. Troponin 774.  EKG NSR, multiple PVC, borderline IV conduction delay.  CT chest/abdomen/pelvis no evidence for PE, stable ascending aortic aneurysmal dilatation, follow-up annual CTA/MRA.  RLL peribronchial thickening/plugging, infectious/inflammatory possible aspiration.  Treated with fentanyl, placed on IV heparin drip, given Rocephin/Flagyl.  Admitted to medicine service NSTEMI, aspiration pneumonia.  Per H&P, Dr. Oval Linsey of [facility] cardiology practice was notified.  Echocardiogram ordered.  Continued on IV Unasyn and Zithromax.  Second troponin was 1889 09/27: VSS, BP above goal but not seriously/symptomatically hypertensive.  Patient continues to have chest pain, unresponsive to nitro, tender to palpation on right anterior ribs/sternum.  Cardiology planning catheterization   Consultants:  Cardiology  Procedures: [Cardiac catheterization  pending]      ASSESSMENT & PLAN:   Principal Problem:   Non-STEMI (non-ST elevated myocardial infarction) ([place]) Active Problems:   Aspiration pneumonia ([place])   Essential hypertension   Dyslipidemia   HFrEF (heart failure with reduced ejection fraction) (HCC)   Hypokalemia   Type 2 diabetes mellitus with stage 3a chronic kidney disease, without long-term current use of insulin (HCC)   Non-STEMI (non-ST elevated myocardial infarction) ([place]) Persistent chest pain, DDx most likely cardiac however would include musculoskeletal pain Continue IV heparin drip High potency statin As needed SL nitro and IV morphine, added Tylenol Cardiology taking him to Cath Lab today  HFrEF (heart failure with reduced ejection fraction) ([place]) last known EF 20 to 25% as of 07/2021 Essential hypertension continue Aldactone, Entresto, Imdur, Coreg and [place]. Lasix 20 mg p.o. daily Discontinued continuous IV fluids  Aspiration pneumonia (HCC) History of COPD, not currently in exacerbation Tobacco abuse/dependence IV ampicillin-sulbactam and azithromycin - 07/18/2022 is day 2 total of antibiotics (received ceftriaxone and metronidazole in the ED) follow blood cultures. Mucolytics Bronchodilators: Albuterol nebulizer every 6 hours as needed, Nicotine replacement as needed  Dyslipidemia high-dose statin therapy. obtain fasting lipids.  Type 2 diabetes mellitus with stage 3a chronic kidney disease, without long-term current use of insulin (HCC) supplement coverage with NovoLog. continue his [place].  Hypokalemia Potassium will be replaced and magnesium level will be checked Follow-up BMP  Mental health condition, unspecified Continue home lamotrigine, duloxetine Alprazolam as needed  DVT prophylaxis: Currently on heparin drip for NSTEMI treatment Pertinent IV fluids/nutrition: Currently on maintenance normal saline with potassium at 100 mL/h, will reduce this given history of CHF.  He is on  carb modified diet Central lines / invasive devices: none  Code Status: FULL CODE Family Communication: none at this time   Disposition: inpatient, anticipate  eventual discharge back to SNF TOC needs: none at this time, pt from SNF Barriers to discharge / significant pending items: Actively treating medical conditions as above             Subjective:  Patient reports continued chest pain, no shortness of breath.       Objective:  Vitals:   07/18/22 1030 07/18/22 1100 07/18/22 1130 07/18/22 1200  BP: (!) 134/91 (!) 135/107 131/67 114/83  Pulse: 75 82 79 74  Resp: '18 19 17 20  '$ Temp:      TempSrc:      SpO2: 98% 100% 94% 95%  Weight:      Height:        Intake/Output Summary (Last 24 hours) at 07/18/2022 1303 Last data filed at 07/18/2022 0756 Gross per 24 hour  Intake 100 ml  Output 550 ml  Net -450 ml   Filed Weights   07/17/22 2150  Weight: 62.7 kg    Examination:  Constitutional:  VS as above General Appearance: alert, well-developed, well-nourished, NAD Ears, Nose, Mouth, Throat: Normal external appearance MM dry Neck: No masses, trachea midline Respiratory: Normal respiratory effort No wheeze No rhonchi No rales Cardiovascular: S1/S2 normal No rub/gallop auscultated No JVD No lower extremity edema Gastrointestinal: No tenderness Musculoskeletal:  Symmetrical movement in all extremities Neurological: No cranial nerve deficit on limited exam Alert Psychiatric: Normal judgment/insight Anxious mood and affect       Scheduled Medications:   [START ON 07/19/2022] aspirin  81 mg Oral Pre-Cath   [MAR Hold] aspirin EC  81 mg Oral Daily   [MAR Hold] atorvastatin  80 mg Oral QHS   [MAR Hold] carvedilol  3.125 mg Oral BID WC   [MAR Hold] cholecalciferol  1,000 Units Oral Daily   [MAR Hold] cyanocobalamin  1,000 mcg Oral Daily   [MAR Hold] dapagliflozin propanediol  10 mg Oral Daily   [MAR Hold] DULoxetine  30 mg Oral BID   [MAR  Hold] ferrous sulfate  325 mg Oral QODAY   [MAR Hold] furosemide  20 mg Oral Daily   [MAR Hold] insulin aspart  0-5 Units Subcutaneous QHS   [MAR Hold] insulin aspart  0-9 Units Subcutaneous TID WC   [MAR Hold] isosorbide mononitrate  30 mg Oral Daily   [MAR Hold] lamoTRIgine  25 mg Oral BID   [MAR Hold] loratadine  10 mg Oral Daily   [MAR Hold] melatonin  5 mg Oral QHS   [MAR Hold] montelukast  10 mg Oral QHS   [MAR Hold] multivitamin-lutein  1 capsule Oral Daily   [MAR Hold] nitroGLYCERIN  1 inch Topical Q6H   [MAR Hold] pantoprazole  40 mg Oral Daily   [MAR Hold] sacubitril-valsartan  1 tablet Oral BID   [MAR Hold] sodium chloride flush  3 mL Intravenous Q12H   [MAR Hold] spironolactone  25 mg Oral Daily    Continuous Infusions:  sodium chloride     [START ON 07/19/2022] sodium chloride     [MAR Hold] ampicillin-sulbactam (UNASYN) IV 3 g (07/18/22 1227)   [MAR Hold] azithromycin Stopped (07/18/22 0253)   heparin 850 Units/hr (07/18/22 1032)    PRN Medications:  sodium chloride, [MAR Hold] acetaminophen, [MAR Hold] albuterol, [MAR Hold] ALPRAZolam, [MAR Hold] guaiFENesin-dextromethorphan, [MAR Hold] hydrOXYzine, [MAR Hold] magnesium hydroxide, [MAR Hold]  morphine injection, [MAR Hold] nitroGLYCERIN, [MAR Hold] ondansetron (ZOFRAN) IV, sodium chloride flush, [MAR Hold] traZODone  Antimicrobials:  Anti-infectives (From admission, onward)    Start     Dose/Rate Route  Frequency Ordered Stop   07/18/22 0015  [MAR Hold]  azithromycin (ZITHROMAX) 500 mg in sodium chloride 0.9 % 250 mL IVPB        (MAR Hold since Wed 07/18/2022 at 1301.Hold Reason: Transfer to a Procedural area)   500 mg 250 mL/hr over 60 Minutes Intravenous Every 24 hours 07/18/22 0010 07/23/22 0014   07/18/22 0015  [MAR Hold]  Ampicillin-Sulbactam (UNASYN) 3 g in sodium chloride 0.9 % 100 mL IVPB        (MAR Hold since Wed 07/18/2022 at 1301.Hold Reason: Transfer to a Procedural area)   3 g 200 mL/hr over 30 Minutes  Intravenous Every 6 hours 07/18/22 0010     07/17/22 2330  metroNIDAZOLE (FLAGYL) IVPB 500 mg  Status:  Discontinued        500 mg 100 mL/hr over 60 Minutes Intravenous Every 8 hours 07/17/22 2319 07/18/22 0010   07/17/22 2330  cefTRIAXone (ROCEPHIN) 2 g in sodium chloride 0.9 % 100 mL IVPB  Status:  Discontinued        2 g 200 mL/hr over 30 Minutes Intravenous  Once 07/17/22 2320 07/18/22 0030       Data Reviewed: I have personally reviewed following labs and imaging studies  CBC: Recent Labs  Lab 07/17/22 2204 07/18/22 0850  WBC 14.0* 14.2*  HGB 14.2 14.8  HCT 45.9 48.6  MCV 81.2 82.1  PLT 184 409   Basic Metabolic Panel: Recent Labs  Lab 07/17/22 2204 07/17/22 2324 07/18/22 0850  NA 142  --  144  K 3.4*  --  4.0  CL 113*  --  114*  CO2 19*  --  21*  GLUCOSE 212*  --  145*  BUN 24*  --  20  CREATININE 1.62*  --  1.63*  CALCIUM 8.9  --  8.4*  MG  --  1.9  --    GFR: Estimated Creatinine Clearance: 33.1 mL/min (A) (by C-G formula based on SCr of 1.63 mg/dL (H)). Liver Function Tests: Recent Labs  Lab 07/17/22 2204  AST 20  ALT 13  ALKPHOS 123  BILITOT 0.8  PROT 6.6  ALBUMIN 3.4*   Recent Labs  Lab 07/17/22 2204  LIPASE 77*   No results for input(s): "AMMONIA" in the last 168 hours. Coagulation Profile: Recent Labs  Lab 07/17/22 2206  INR 1.3*   Cardiac Enzymes: No results for input(s): "CKTOTAL", "CKMB", "CKMBINDEX", "TROPONINI" in the last 168 hours. BNP (last 3 results) No results for input(s): "PROBNP" in the last 8760 hours. HbA1C: Recent Labs    07/17/22 2204  HGBA1C 7.7*   CBG: Recent Labs  Lab 07/18/22 0751 07/18/22 1215  GLUCAP 129* 153*   Lipid Profile: Recent Labs    07/18/22 0859  CHOL 164  HDL 44  LDLCALC 93  TRIG 135  CHOLHDL 3.7   Thyroid Function Tests: No results for input(s): "TSH", "T4TOTAL", "FREET4", "T3FREE", "THYROIDAB" in the last 72 hours. Anemia Panel: No results for input(s): "VITAMINB12",  "FOLATE", "FERRITIN", "TIBC", "IRON", "RETICCTPCT" in the last 72 hours. Urine analysis:    Component Value Date/Time   COLORURINE STRAW (A) 07/15/2021 1839   APPEARANCEUR CLEAR (A) 07/15/2021 1839   LABSPEC 1.014 07/15/2021 1839   PHURINE 5.0 07/15/2021 1839   GLUCOSEU 150 (A) 07/15/2021 1839   HGBUR NEGATIVE 07/15/2021 1839   BILIRUBINUR NEGATIVE 07/15/2021 1839   KETONESUR NEGATIVE 07/15/2021 1839   PROTEINUR NEGATIVE 07/15/2021 1839   NITRITE NEGATIVE 07/15/2021 1839   LEUKOCYTESUR NEGATIVE 07/15/2021 1839  Sepsis Labs: '@LABRCNTIP'$ (procalcitonin:4,lacticidven:4)  Recent Results (from the past 240 hour(s))  Blood culture (routine x 2)     Status: None (Preliminary result)   Collection Time: 07/17/22 11:24 PM   Specimen: BLOOD  Result Value Ref Range Status   Specimen Description BLOOD LEFT FOREARM  Final   Special Requests   Final    BOTTLES DRAWN AEROBIC AND ANAEROBIC Blood Culture results may not be optimal due to an inadequate volume of blood received in culture bottles   Culture   Final    NO GROWTH < 12 HOURS Performed at [facility], [address] [place], [place] [address]    Report Status PENDING  Incomplete  Blood culture (routine x 2)     Status: None (Preliminary result)   Collection Time: 07/17/22 11:24 PM   Specimen: BLOOD  Result Value Ref Range Status   Specimen Description BLOOD RIGHT FOREARM  Final   Special Requests   Final    BOTTLES DRAWN AEROBIC AND ANAEROBIC Blood Culture results may not be optimal due to an inadequate volume of blood received in culture bottles   Culture   Final    NO GROWTH < 12 HOURS Performed at [facility], [address], [place], [place] [address]    Report Status PENDING  Incomplete         Radiology Studies: CT ABDOMEN PELVIS W CONTRAST  Result Date: 07/17/2022 CLINICAL DATA:  Epigastric pain EXAM: CT ABDOMEN AND PELVIS WITH CONTRAST TECHNIQUE: Multidetector CT imaging of the abdomen and  pelvis was performed using the standard protocol following bolus administration of intravenous contrast. RADIATION DOSE REDUCTION: This exam was performed according to the departmental dose-optimization program which includes automated exposure control, adjustment of the mA and/or kV according to patient size and/or use of iterative reconstruction technique. CONTRAST:  65m OMNIPAQUE IOHEXOL 350 MG/ML SOLN COMPARISON:  CT 07/15/2021, 10/17/2020 FINDINGS: Lower chest: Lung bases are degraded by motion. No consolidative process, see separately dictated chest CT. Aortic valve prosthesis. Hepatobiliary: No focal liver abnormality is seen. Status post cholecystectomy. No biliary dilatation. Pancreas: Unremarkable. No pancreatic ductal dilatation or surrounding inflammatory changes. Spleen: Normal in size without focal abnormality. Adrenals/Urinary Tract: Adrenal glands are within normal limits. Kidneys show no hydronephrosis. Subcentimeter hypodense renal lesions too small to further characterize. Partially exophytic upper pole lesion on the right measuring 1.9 cm, contains slightly dense complex area, series 4, image 26. Mild wall thickening along the right bladder wall with slightly asymmetric mucosal enhancement, series 4, image 75. Stomach/Bowel: The stomach is nonenlarged. No dilated small bowel. No acute bowel wall thickening. Postsurgical changes in the left upper quadrant with chronically dilated segment of bowel in the region but no obstruction. Negative appendix Vascular/Lymphatic: Advanced aortic atherosclerosis. No aneurysm. Chronically occluded left SFA stent incompletely visualized. No suspicious lymph nodes. Reproductive: Prostate is unremarkable. Other: Negative for pelvic effusion or free air. Musculoskeletal: No acute osseous abnormality. IMPRESSION: 1. No CT evidence for acute intra-abdominal or pelvic abnormality. 2. 19 mm partially exophytic complex cystic lesion upper pole right kidney. When the  patient is clinically stable and able to follow directions and hold their breath (preferably as an outpatient) further evaluation with dedicated abdominal MRI should be considered. 3. Mild wall thickening with slight asymmetric mucosal enhancement at the right posterior bladder wall, indeterminate for an area of infection or inflammation. 4. Other chronic findings as discussed above Electronically Signed   By: KDonavan FoilM.D.   On: 07/17/2022 23:16  CT Angio Chest PE W and/or Wo Contrast  Result Date: 07/17/2022 CLINICAL DATA:  Positive D-dimer with epigastric pain. EXAM: CT ANGIOGRAPHY CHEST WITH CONTRAST TECHNIQUE: Multidetector CT imaging of the chest was performed using the standard protocol during bolus administration of intravenous contrast. Multiplanar CT image reconstructions and MIPs were obtained to evaluate the vascular anatomy. RADIATION DOSE REDUCTION: This exam was performed according to the departmental dose-optimization program which includes automated exposure control, adjustment of the mA and/or kV according to patient size and/or use of iterative reconstruction technique. CONTRAST:  8m OMNIPAQUE IOHEXOL 350 MG/ML SOLN COMPARISON:  CT angiogram chest 08/03/2021 FINDINGS: Cardiovascular: There is mild aneurysmal dilatation of the ascending aorta measuring 4.1 cm which is unchanged. There are atherosclerotic calcifications of the aorta. Aortic valve replacement is present. The heart is mildly enlarged. There is no pericardial effusion. There are atherosclerotic calcifications of the aorta and coronary arteries. There is adequate opacification of the pulmonary arteries to the segmental level. No pulmonary embolism identified. Mediastinum/Nodes: No enlarged mediastinal, hilar, or axillary lymph nodes. Thyroid gland, trachea, and esophagus demonstrate no significant findings. Lungs/Pleura: There are minimal patchy airspace opacities in the right lower lobe. There is right lower lobe  peribronchial wall thickening and plugging of bronchi similar to prior study. Right pleural effusion has resolved in the interval. There is minimal left lower lobe atelectasis. There is no evidence for pneumothorax. Upper Abdomen: Cholecystectomy clips are present. Musculoskeletal: No chest wall abnormality. No acute or significant osseous findings. Sternotomy wires are present. Review of the MIP images confirms the above findings. IMPRESSION: 1. No evidence for pulmonary embolism. 2. 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 Recommend annual imaging followup by CTA or MRA. This recommendation follows 2010 ACCF/AHA/AATS/ACR/ASA/SCA/SCAI/SIR/STS/SVM Guidelines for the Diagnosis and Management of Patients with Thoracic Aortic Disease. Circulation. 2010; 121:: X450-T888 Aortic aneurysm NOS (ICD10-I71.9) 3. Right lower lobe peribronchial wall thickening, plugging of bronchi and minimal airspace disease compatible with infectious/inflammatory process. Correlate clinically for aspiration. 4. Mild cardiomegaly. 5.  Aortic Atherosclerosis (ICD10-I70.0). Electronically Signed   By: ARonney AstersM.D.   On: 07/17/2022 23:13   DG Chest Port 1 View  Result Date: 07/17/2022 CLINICAL DATA:  Chest pain EXAM: PORTABLE CHEST 1 VIEW COMPARISON:  11/08/2021 FINDINGS: Prior CABG. Heart and mediastinal contours are within normal limits. No focal opacities or effusions. No acute bony abnormality. IMPRESSION: No active cardiopulmonary disease. Electronically Signed   By: KRolm BaptiseM.D.   On: 07/17/2022 22:14            LOS: 1 day       NEmeterio Reeve DO [facility] 07/18/2022, 1:03 PM   Staff may message me via secure chat in [place] but this may not receive immediate response,  please page for urgent matters!  If 7PM-7AM, please contact night-coverage [email or web address]  Dictation software was used to generate the above note. Typos may occur and escape review, as with  typed/written notes. Please contact Dr ASheppard Coildirectly for clarity if needed.

## 2022-07-18 NOTE — ED Notes (Signed)
Messaged provider and cardiologist re: pt still has 6/10 chest pain, have given 2 NTG tabs and PRN IV morphine '2mg'$ . Pt does not appear diaphoretic, pale or SOB. Pt is on RA. Heparin is infusing at 750 units/hr and heparin level was drawn and sent.

## 2022-07-18 NOTE — Assessment & Plan Note (Addendum)
-   The patient will be placed on IV Unasyn and Zithromax. - We will follow blood cultures. - Mucolytic therapy will be provided as needed as well as bronchodilator therapy.

## 2022-07-19 DIAGNOSIS — J690 Pneumonitis due to inhalation of food and vomit: Secondary | ICD-10-CM | POA: Diagnosis not present

## 2022-07-19 DIAGNOSIS — I214 Non-ST elevation (NSTEMI) myocardial infarction: Secondary | ICD-10-CM | POA: Diagnosis not present

## 2022-07-19 DIAGNOSIS — N1831 Chronic kidney disease, stage 3a: Secondary | ICD-10-CM

## 2022-07-19 DIAGNOSIS — I10 Essential (primary) hypertension: Secondary | ICD-10-CM | POA: Diagnosis not present

## 2022-07-19 DIAGNOSIS — I5020 Unspecified systolic (congestive) heart failure: Secondary | ICD-10-CM | POA: Diagnosis not present

## 2022-07-19 DIAGNOSIS — E1122 Type 2 diabetes mellitus with diabetic chronic kidney disease: Secondary | ICD-10-CM

## 2022-07-19 LAB — BASIC METABOLIC PANEL
Anion gap: 7 (ref 5–15)
BUN: 18 mg/dL (ref 8–23)
CO2: 19 mmol/L — ABNORMAL LOW (ref 22–32)
Calcium: 8 mg/dL — ABNORMAL LOW (ref 8.9–10.3)
Chloride: 114 mmol/L — ABNORMAL HIGH (ref 98–111)
Creatinine, Ser: 1.53 mg/dL — ABNORMAL HIGH (ref 0.61–1.24)
GFR, Estimated: 46 mL/min — ABNORMAL LOW (ref >60)
Glucose, Bld: 210 mg/dL — ABNORMAL HIGH (ref 70–99)
Potassium: 3.3 mmol/L — ABNORMAL LOW (ref 3.5–5.1)
Sodium: 140 mmol/L (ref 135–145)

## 2022-07-19 LAB — CBC
HCT: 40 % (ref 39.0–52.0)
Hemoglobin: 12.7 g/dL — ABNORMAL LOW (ref 13.0–17.0)
MCH: 25.5 pg — ABNORMAL LOW (ref 26.0–34.0)
MCHC: 31.8 g/dL (ref 30.0–36.0)
MCV: 80.2 fL (ref 80.0–100.0)
Platelets: 190 10*3/uL (ref 150–400)
RBC: 4.99 MIL/uL (ref 4.22–5.81)
RDW: 17.2 % — ABNORMAL HIGH (ref 11.5–15.5)
WBC: 13 10*3/uL — ABNORMAL HIGH (ref 4.0–10.5)
nRBC: 0 % (ref 0.0–0.2)

## 2022-07-19 LAB — GLUCOSE, CAPILLARY
Glucose-Capillary: 140 mg/dL — ABNORMAL HIGH (ref 70–99)
Glucose-Capillary: 187 mg/dL — ABNORMAL HIGH (ref 70–99)

## 2022-07-19 LAB — APTT: aPTT: 125 seconds — ABNORMAL HIGH (ref 24–36)

## 2022-07-19 LAB — LIPOPROTEIN A (LPA): Lipoprotein (a): 191.8 nmol/L — ABNORMAL HIGH (ref <75.0)

## 2022-07-19 LAB — HEPARIN LEVEL (UNFRACTIONATED): Heparin Unfractionated: >1.1 IU/mL — ABNORMAL HIGH (ref 0.30–0.70)

## 2022-07-19 MED ORDER — APIXABAN 5 MG PO TABS
5.0000 mg | ORAL_TABLET | Freq: Two times a day (BID) | ORAL | Status: DC
  Administered 2022-07-19: 5 mg via ORAL
  Filled 2022-07-19: qty 1

## 2022-07-19 MED ORDER — CLOPIDOGREL BISULFATE 75 MG PO TABS: 75.0000 mg | ORAL_TABLET | Freq: Every day | ORAL | 0 refills | Status: AC

## 2022-07-19 MED ORDER — AMOXICILLIN-POT CLAVULANATE 875-125 MG PO TABS: 1.0000 | ORAL_TABLET | Freq: Two times a day (BID) | ORAL | 0 refills | Status: AC

## 2022-07-19 MED ORDER — AMOXICILLIN-POT CLAVULANATE 875-125 MG PO TABS
1.0000 | ORAL_TABLET | Freq: Two times a day (BID) | ORAL | Status: DC
  Administered 2022-07-19: 1 via ORAL
  Filled 2022-07-19: qty 1

## 2022-07-19 NOTE — Progress Notes (Signed)
Rounding Note    Patient Name: Shawn Jennings Date of Encounter: 07/19/2022  [place] Cardiologist: [name], MD   Subjective   Patient denies chest pain. Breathing is good. Some bruising on the forearms.   Inpatient Medications    Scheduled Meds:  aspirin EC  81 mg Oral Daily   atorvastatin  80 mg Oral QHS   carvedilol  3.125 mg Oral BID WC   cholecalciferol  1,000 Units Oral Daily   clopidogrel  75 mg Oral Q0600   cyanocobalamin  1,000 mcg Oral Daily   dapagliflozin propanediol  10 mg Oral Daily   DULoxetine  30 mg Oral BID   ferrous sulfate  325 mg Oral QODAY   insulin aspart  0-5 Units Subcutaneous QHS   insulin aspart  0-9 Units Subcutaneous TID WC   isosorbide mononitrate  30 mg Oral Daily   lamoTRIgine  25 mg Oral BID   loratadine  10 mg Oral Daily   melatonin  5 mg Oral QHS   montelukast  10 mg Oral QHS   multivitamin-lutein  1 capsule Oral Daily   nicotine  14 mg Transdermal Daily   pantoprazole  40 mg Oral Daily   sacubitril-valsartan  1 tablet Oral BID   sodium chloride flush  3 mL Intravenous Q12H   sodium chloride flush  3 mL Intravenous Q12H   spironolactone  25 mg Oral Daily   Continuous Infusions:  sodium chloride     ampicillin-sulbactam (UNASYN) IV 3 g (07/19/22 0630)   azithromycin 500 mg (07/19/22 0057)   heparin 700 Units/hr (07/19/22 0839)   PRN Meds: sodium chloride, acetaminophen, albuterol, ALPRAZolam, guaiFENesin-dextromethorphan, hydrOXYzine, magnesium hydroxide, morphine injection, nitroGLYCERIN, ondansetron (ZOFRAN) IV, sodium chloride flush, traZODone   Vital Signs    Vitals:   07/18/22 2008 07/18/22 2332 07/19/22 0434 07/19/22 0800  BP: 111/70 (!) 117/56 (!) 109/52 123/69  Pulse: 87 88 76 71  Resp: '20 20 20 20  '$ Temp: 98.3 F (36.8 C) 98.2 F (36.8 C) 98.5 F (36.9 C) 98.1 F (36.7 C)  TempSrc:    Oral  SpO2: 100% 95% 94% 96%  Weight:      Height:        Intake/Output Summary (Last 24 hours) at  07/19/2022 0911 Last data filed at 07/19/2022 0400 Gross per 24 hour  Intake 2243.92 ml  Output 1150 ml  Net 1093.92 ml      07/18/2022    6:10 PM 07/17/2022    9:50 PM 11/10/2021    5:00 AM  Last 3 Weights  Weight (lbs) 139 lb 12.4 oz 138 lb 3.7 oz 133 lb 13.1 oz  Weight (kg) 63.4 kg 62.7 kg 60.7 kg      Telemetry    NSR, HR 80-90s, PACs - Personally Reviewed  ECG    No new - Personally Reviewed  Physical Exam   GEN: No acute distress.   Neck: No JVD Cardiac: RRR, no murmurs, rubs, or gallops.  Respiratory: Clear to auscultation bilaterally. GI: Soft, nontender, non-distended  MS: No edema; No deformity. Neuro:  Nonfocal  Psych: Normal affect   Labs    High Sensitivity Troponin:   Recent Labs  Lab 07/17/22 2204 07/17/22 2324  TROPONINIHS 774* 1,889*     Chemistry Recent Labs  Lab 07/17/22 2204 07/17/22 2324 07/18/22 0850 07/18/22 1858 07/19/22 0644  NA 142  --  144 142 140  K 3.4*  --  4.0 4.2 3.3*  CL 113*  --  114*  112* 114*  CO2 19*  --  21* 17* 19*  GLUCOSE 212*  --  145* 133* 210*  BUN 24*  --  '20 20 18  '$ CREATININE 1.62*  --  1.63* 1.56* 1.53*  CALCIUM 8.9  --  8.4* 8.4* 8.0*  MG  --  1.9  --   --   --   PROT 6.6  --   --   --   --   ALBUMIN 3.4*  --   --   --   --   AST 20  --   --   --   --   ALT 13  --   --   --   --   ALKPHOS 123  --   --   --   --   BILITOT 0.8  --   --   --   --   GFRNONAA 43*  --  43* 45* 46*  ANIONGAP 10  --  '9 13 7    '$ Lipids  Recent Labs  Lab 07/18/22 0859  CHOL 164  TRIG 135  HDL 44  LDLCALC 93  CHOLHDL 3.7    Hematology Recent Labs  Lab 07/17/22 2204 07/18/22 0850 07/19/22 0644  WBC 14.0* 14.2* 13.0*  RBC 5.65 5.92* 4.99  HGB 14.2 14.8 12.7*  HCT 45.9 48.6 40.0  MCV 81.2 82.1 80.2  MCH 25.1* 25.0* 25.5*  MCHC 30.9 30.5 31.8  RDW 17.5* 18.0* 17.2*  PLT 184 183 190   Thyroid No results for input(s): "TSH", "FREET4" in the last 168 hours.  BNPNo results for input(s): "BNP", "PROBNP" in the last  168 hours.  DDimer  Recent Labs  Lab 07/17/22 2204  DDIMER 1.21*     Radiology    CARDIAC CATHETERIZATION  Result Date: 07/18/2022 Conclusions: Severe native coronary artery disease with chronic total occlusions of the mid LAD, mid LCx, and proximal RCA (unchanged from prior catheterization in 2021). Widely patent LIMA-LAD. Patent RIMA-RCA with 40% ostial stenosis. Patent Y graft supplying D2 and OM3 with 95% stenosis in the SVG portion to D2 just after the origin of radial-OM3. Normal left heart, right heart, and pulmonary artery pressures. Mildly reduced Fick cardiac output/index. Successful PCI to SVG-D2 using Onyx frontier 3.0 x 22 mm drug-eluting stent (postdilated to 3.6 mm) with 0% residual stenosis and TIMI-3 flow.  The stent jails the radial-OM3 limb, with stable 20% stenosis at the ostium of the graft. Occluded/heavily stenosed right radial artery that is inadequate for catheterization. Recommendations: Gentle post catheterization hydration in the setting of chronic kidney disease and contrast exposure for catheterization and CT yesterday evening. Restart heparin infusion 8 hours after sheath removal. Continue aspirin and clopidogrel overnight.  If no evidence of bleeding or vascular complication, would recommend switching to apixaban 5 mg twice daily plus clopidogrel 75 mg daily for 12 months as soon as tomorrow. Aggressive secondary prevention of coronary artery disease. Optimize goal-directed medical therapy for chronic HFrEF, as blood pressure and renal function allow. Nelva Bush, MD [facility]  ECHOCARDIOGRAM COMPLETE  Result Date: 07/18/2022    ECHOCARDIOGRAM REPORT   Patient Name:   Shawn Jennings Date of Exam: 07/18/2022 Medical Rec #:  [id number]      Height:       72.0 in Accession #:    [id number]     Weight:       138.2 lb Date of Birth:  1945-12-11      BSA:  1.821 m Patient Age:    76 years       BP:           131/67 mmHg Patient Gender: M              HR:            74 bpm. Exam Location:  [facility] Procedure: 2D Echo, Color Doppler, Cardiac Doppler and Intracardiac            Opacification Agent Indications:     I21.4 NSTEMI  History:         Patient has prior history of Echocardiogram examinations, most                  recent 08/03/2021. Ischemic Cardiomyopathy, CAD, Prior CABG,                  CKD, COPD and PAD; Risk Factors:Diabetes, Current Smoker,                  Hypertension and Dyslipidemia.                  Aortic Valve: bioprosthetic valve is present in the aortic                  position.  Sonographer:     Charmayne Sheer Referring Phys:  [id number] [place] Diagnosing Phys: Kate Sable MD IMPRESSIONS  1. Left ventricular ejection fraction, by estimation, is 20 to 25%. The left ventricle has severely decreased function. The left ventricle demonstrates global hypokinesis. There is mild left ventricular hypertrophy. Left ventricular diastolic parameters  are consistent with Grade II diastolic dysfunction (pseudonormalization).  2. Right ventricular systolic function is normal. The right ventricular size is not well visualized.  3. The mitral valve is degenerative. No evidence of mitral valve regurgitation.  4. The aortic valve has been repaired/replaced. Aortic valve regurgitation is not visualized. There is a bioprosthetic valve present in the aortic position.  5. Aortic dilatation noted. There is mild dilatation of the ascending aorta, measuring 40 mm.  6. The inferior vena cava is normal in size with greater than 50% respiratory variability, suggesting right atrial pressure of 3 mmHg. FINDINGS  Left Ventricle: Left ventricular ejection fraction, by estimation, is 20 to 25%. The left ventricle has severely decreased function. The left ventricle demonstrates global hypokinesis. Definity contrast agent was given IV to delineate the left ventricular endocardial borders. The left ventricular internal cavity size was normal in size. There is mild left ventricular  hypertrophy. Left ventricular diastolic parameters are consistent with Grade II diastolic dysfunction (pseudonormalization). Right Ventricle: The right ventricular size is not well visualized. No increase in right ventricular wall thickness. Right ventricular systolic function is normal. Left Atrium: Left atrial size was normal in size. Right Atrium: Right atrial size was normal in size. Pericardium: There is no evidence of pericardial effusion. Mitral Valve: The mitral valve is degenerative in appearance. No evidence of mitral valve regurgitation. Tricuspid Valve: The tricuspid valve is not well visualized. Tricuspid valve regurgitation is not demonstrated. Aortic Valve: The aortic valve has been repaired/replaced. Aortic valve regurgitation is not visualized. Aortic valve mean gradient measures 6.0 mmHg. Aortic valve peak gradient measures 10.2 mmHg. Aortic valve area, by VTI measures 1.84 cm. There is a bioprosthetic valve present in the aortic position. Pulmonic Valve: The pulmonic valve was not well visualized. Pulmonic valve regurgitation is not visualized. Aorta: Aortic dilatation noted. There is mild dilatation of the ascending aorta,  measuring 40 mm. Venous: The inferior vena cava is normal in size with greater than 50% respiratory variability, suggesting right atrial pressure of 3 mmHg. IAS/Shunts: No atrial level shunt detected by color flow Doppler.  LEFT VENTRICLE PLAX 2D LVIDd:         6.20 cm   Diastology LVIDs:         5.10 cm   LV e' medial:    3.59 cm/s LV PW:         1.10 cm   LV E/e' medial:  42.1 LV IVS:        1.30 cm   LV e' lateral:   6.42 cm/s LVOT diam:     1.90 cm   LV E/e' lateral: 23.5 LV SV:         51 LV SV Index:   28 LVOT Area:     2.84 cm  RIGHT VENTRICLE RV Basal diam:  3.20 cm LEFT ATRIUM           Index        RIGHT ATRIUM           Index LA diam:      3.90 cm 2.14 cm/m   RA Area:     12.00 cm LA Vol (A2C): 25.0 ml 13.73 ml/m  RA Volume:   26.70 ml  14.66 ml/m  AORTIC VALVE                      PULMONIC VALVE AV Area (Vmax):    1.91 cm      PV Vmax:       0.94 m/s AV Area (Vmean):   1.80 cm      PV Peak grad:  3.5 mmHg AV Area (VTI):     1.84 cm AV Vmax:           160.00 cm/s AV Vmean:          108.000 cm/s AV VTI:            0.280 m AV Peak Grad:      10.2 mmHg AV Mean Grad:      6.0 mmHg LVOT Vmax:         107.80 cm/s LVOT Vmean:        68.400 cm/s LVOT VTI:          0.182 m LVOT/AV VTI ratio: 0.65  AORTA Ao Root diam: 3.30 cm MITRAL VALVE MV Area (PHT): 5.90 cm     SHUNTS MV Decel Time: 129 msec     Systemic VTI:  0.18 m MV E velocity: 151.00 cm/s  Systemic Diam: 1.90 cm Kate Sable MD Electronically signed by Kate Sable MD Signature Date/Time: 07/18/2022/3:04:38 PM    Final    CT ABDOMEN PELVIS W CONTRAST  Result Date: 07/17/2022 CLINICAL DATA:  Epigastric pain EXAM: CT ABDOMEN AND PELVIS WITH CONTRAST TECHNIQUE: Multidetector CT imaging of the abdomen and pelvis was performed using the standard protocol following bolus administration of intravenous contrast. RADIATION DOSE REDUCTION: This exam was performed according to the departmental dose-optimization program which includes automated exposure control, adjustment of the mA and/or kV according to patient size and/or use of iterative reconstruction technique. CONTRAST:  53m OMNIPAQUE IOHEXOL 350 MG/ML SOLN COMPARISON:  CT 07/15/2021, 10/17/2020 FINDINGS: Lower chest: Lung bases are degraded by motion. No consolidative process, see separately dictated chest CT. Aortic valve prosthesis. Hepatobiliary: No focal liver abnormality is seen. Status post cholecystectomy. No biliary dilatation. Pancreas: Unremarkable. No pancreatic  ductal dilatation or surrounding inflammatory changes. Spleen: Normal in size without focal abnormality. Adrenals/Urinary Tract: Adrenal glands are within normal limits. Kidneys show no hydronephrosis. Subcentimeter hypodense renal lesions too small to further characterize. Partially  exophytic upper pole lesion on the right measuring 1.9 cm, contains slightly dense complex area, series 4, image 26. Mild wall thickening along the right bladder wall with slightly asymmetric mucosal enhancement, series 4, image 75. Stomach/Bowel: The stomach is nonenlarged. No dilated small bowel. No acute bowel wall thickening. Postsurgical changes in the left upper quadrant with chronically dilated segment of bowel in the region but no obstruction. Negative appendix Vascular/Lymphatic: Advanced aortic atherosclerosis. No aneurysm. Chronically occluded left SFA stent incompletely visualized. No suspicious lymph nodes. Reproductive: Prostate is unremarkable. Other: Negative for pelvic effusion or free air. Musculoskeletal: No acute osseous abnormality. IMPRESSION: 1. No CT evidence for acute intra-abdominal or pelvic abnormality. 2. 19 mm partially exophytic complex cystic lesion upper pole right kidney. When the patient is clinically stable and able to follow directions and hold their breath (preferably as an outpatient) further evaluation with dedicated abdominal MRI should be considered. 3. Mild wall thickening with slight asymmetric mucosal enhancement at the right posterior bladder wall, indeterminate for an area of infection or inflammation. 4. Other chronic findings as discussed above Electronically Signed   By: Donavan Foil M.D.   On: 07/17/2022 23:16   CT Angio Chest PE W and/or Wo Contrast  Result Date: 07/17/2022 CLINICAL DATA:  Positive D-dimer with epigastric pain. EXAM: CT ANGIOGRAPHY CHEST WITH CONTRAST TECHNIQUE: Multidetector CT imaging of the chest was performed using the standard protocol during bolus administration of intravenous contrast. Multiplanar CT image reconstructions and MIPs were obtained to evaluate the vascular anatomy. RADIATION DOSE REDUCTION: This exam was performed according to the departmental dose-optimization program which includes automated exposure control, adjustment  of the mA and/or kV according to patient size and/or use of iterative reconstruction technique. CONTRAST:  45m OMNIPAQUE IOHEXOL 350 MG/ML SOLN COMPARISON:  CT angiogram chest 08/03/2021 FINDINGS: Cardiovascular: There is mild aneurysmal dilatation of the ascending aorta measuring 4.1 cm which is unchanged. There are atherosclerotic calcifications of the aorta. Aortic valve replacement is present. The heart is mildly enlarged. There is no pericardial effusion. There are atherosclerotic calcifications of the aorta and coronary arteries. There is adequate opacification of the pulmonary arteries to the segmental level. No pulmonary embolism identified. Mediastinum/Nodes: No enlarged mediastinal, hilar, or axillary lymph nodes. Thyroid gland, trachea, and esophagus demonstrate no significant findings. Lungs/Pleura: There are minimal patchy airspace opacities in the right lower lobe. There is right lower lobe peribronchial wall thickening and plugging of bronchi similar to prior study. Right pleural effusion has resolved in the interval. There is minimal left lower lobe atelectasis. There is no evidence for pneumothorax. Upper Abdomen: Cholecystectomy clips are present. Musculoskeletal: No chest wall abnormality. No acute or significant osseous findings. Sternotomy wires are present. Review of the MIP images confirms the above findings. IMPRESSION: 1. No evidence for pulmonary embolism. 2. 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 Recommend annual imaging followup by CTA or MRA. This recommendation follows 2010 ACCF/AHA/AATS/ACR/ASA/SCA/SCAI/SIR/STS/SVM Guidelines for the Diagnosis and Management of Patients with Thoracic Aortic Disease. Circulation. 2010; 121:: [id number] Aortic aneurysm NOS (ICD10-I71.9) 3. Right lower lobe peribronchial wall thickening, plugging of bronchi and minimal airspace disease compatible with infectious/inflammatory process. Correlate clinically for aspiration. 4.  Mild cardiomegaly. 5.  Aortic Atherosclerosis (ICD10-I70.0). Electronically Signed   By: ATina GriffithsD.  On: 07/17/2022 23:13   DG Chest Port 1 View  Result Date: 07/17/2022 CLINICAL DATA:  Chest pain EXAM: PORTABLE CHEST 1 VIEW COMPARISON:  11/08/2021 FINDINGS: Prior CABG. Heart and mediastinal contours are within normal limits. No focal opacities or effusions. No acute bony abnormality. IMPRESSION: No active cardiopulmonary disease. Electronically Signed   By: Rolm Baptise M.D.   On: 07/17/2022 22:14    Cardiac Studies    Cardiac cath 07/18/22 Conclusion  Conclusions: Severe native coronary artery disease with chronic total occlusions of the mid LAD, mid LCx, and proximal RCA (unchanged from prior catheterization in 2021). Widely patent LIMA-LAD. Patent RIMA-RCA with 40% ostial stenosis. Patent Y graft supplying D2 and OM3 with 95% stenosis in the SVG portion to D2 just after the origin of radial-OM3. Normal left heart, right heart, and pulmonary artery pressures. Mildly reduced Fick cardiac output/index. Successful PCI to SVG-D2 using Onyx frontier 3.0 x 22 mm drug-eluting stent (postdilated to 3.6 mm) with 0% residual stenosis and TIMI-3 flow.  The stent jails the radial-OM3 limb, with stable 20% stenosis at the ostium of the graft. Occluded/heavily stenosed right radial artery that is inadequate for catheterization.   Recommendations: Gentle post catheterization hydration in the setting of chronic kidney disease and contrast exposure for catheterization and CT yesterday evening. Restart heparin infusion 8 hours after sheath removal. Continue aspirin and clopidogrel overnight.  If no evidence of bleeding or vascular complication, would recommend switching to apixaban 5 mg twice daily plus clopidogrel 75 mg daily for 12 months as soon as tomorrow. Aggressive secondary prevention of coronary artery disease. Optimize goal-directed medical therapy for chronic HFrEF, as blood pressure  and renal function allow.   Nelva Bush, MD [facility] Coronary Diagrams  Diagnostic Dominance: Right  Intervention     Echo 07/18/22   1. Left ventricular ejection fraction, by estimation, is 20 to 25%. The  left ventricle has severely decreased function. The left ventricle  demonstrates global hypokinesis. There is mild left ventricular  hypertrophy. Left ventricular diastolic parameters   are consistent with Grade II diastolic dysfunction (pseudonormalization).   2. Right ventricular systolic function is normal. The right ventricular  size is not well visualized.   3. The mitral valve is degenerative. No evidence of mitral valve  regurgitation.   4. The aortic valve has been repaired/replaced. Aortic valve  regurgitation is not visualized. There is a bioprosthetic valve present in  the aortic position.   5. Aortic dilatation noted. There is mild dilatation of the ascending  aorta, measuring 40 mm.   6. The inferior vena cava is normal in size with greater than 50%  respiratory variability, suggesting right atrial pressure of 3 mmHg.   Patient Profile     76 y.o. male  with a hx of CAD status post CABG in 1994 in [place], chronic chest pain, HFrEF secondary to ischemic cardiomyopathy, paroxysmal atrial fibrillation, aortic stenosis status post AVR, PAD status post left AKA, CKD stage III, DM 2, hypertension, hyperlipidemia, COPD, tobacco use, gastroesophageal reflux disease, and depression who is being seen 07/18/2022 for the evaluation of chest pain and elevated troponin.  Assessment & Plan    NSTEMI CAD s/p CABG - presented with chest pain with HS troponin elevated to 1889 started on IV heparin - LHC showed severe native CAD with unchanged disease, patent LIMA-LAD, patent RIMA to RCA with 40% ostial stenosis, patent Y graft, 95% stenosis in the SVG. Treated with PCI to the SVG. - initially DAPT with ASA  and Plavix, however plan to switch Eliquis and Plavix for 12  months - Echo showed LVEF 20-25%, G2DD - continue Statin, Coreg, and Imdur  HFrEF ICM - Echo this admission showed LVEF 20-25% - continue PTA Coreg, Farxiga, Entresto and spironolactone - euvolemic on exam - PTA lasix '20mg'$  daily  Paroxysmal Afib - remains in NSR - CHADSVASC at least 6 - start Eliquis '5mg'$  BID today - continue Coreg for rate control  AKI/CKD stage 3 - stable SCr  HTN - BP good, continue current medications  PNA - abx per IM  For questions or updates, please contact [place] Please consult [email or web address] for contact info under        Signed, Brookelyn Gaynor Ninfa Meeker, PA-C  07/19/2022, 9:11 AM

## 2022-07-19 NOTE — Discharge Summary (Signed)
Shawn Jennings VHQ:[id number] DOB: October 29, 1945 DOA: 07/17/2022  PCP: Pcp, No  Admit date: 07/17/2022 Discharge date: 07/19/2022  Admitted From: assisted living Disposition: assisted living  Recommendations for Outpatient Follow-up:  Follow up with PCP in 1 week Please obtain BMP/CBC in one week Please follow up with cardiology in 1 week    Discharge Condition:Stable CODE STATUS:full  Diet recommendation: Heart Healthy / Carb Modified / Regular / Dysphagia  Brief/Interim Summary: Per HPI: Shawn Jennings is a 76 y.o. Caucasian male with medical history significant for coronary artery disease status post CABG, CHF, stage III chronic kidney disease, COPD, depression, GERD, hypertension, dyslipidemia, ischemic Myopathy, paroxysmal atrial fibrillation on Eliquis, peripheral arterial disease and type 2 diabetes mellitus, who presented to the ER with acute onset of substernal chest pain that was sharp and graded 7/10 in severity.  He had associated nausea and vomiting as well as mild diaphoresis.ED Course: When the patient came to the ER, respiratory rate was 23 and pulse oximetry was 91% on room air and later 93% with otherwise normal vital signs.  Labs reveal hypokalemia 3.4 and CO2 of 19 with blood glucose of 212 chloride 113.  BUN was 24 and creatinine 1.62 slightly above her previous levels.  Albumin was 3.4 with total protein of 6.6.  Serum lipase was 77.  High sensitive troponin was 774 and CBC showed leukocytosis of 14.  D-dimer was 1.21. \EKG showed normal sinus rhythm with a rate of 83 and multiple PVCs with borderline intraventricular conduction delay Imaging: Chest, abdominal and pelvic CT revealed no evidence for PE.  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  It also showed right lower lobe peribronchial wall thickening and plugging of bronchi and minimal airspace disease compatible with  infectious/inflammatory process and could be associated with aspiration.  It also showed mild cardiomegaly and aortic atherosclerosis.  Cardiology was consulted for NSTEMI.  Patient underwent cardiac cath :severe native CAD with unchanged disease, patent LIMA-LAD, patent RIMA to RCA with 40% ostial stenosis, patent Y graft, 95% stenosis in the SVG. Treated with PCI to the SVG. Plan for eliquis and plavix treatment. He is cleared to be discharged home today.   Non-STEMI  Peak troponin: 1889, treated with heparin infusion Cardiac cath yesterday: Treated with PCI to the SVG. patent LIMA-LAD, patent RIMA to RCA with 40% ostial stenosis, patent Y graft, 95% stenosis in the SVG. Treat with Eliquis and Plavix for 12 months, hold asa continue Statin, Coreg, and Imdur   HFrEF (heart failure with reduced ejection fraction) (HCC) last known EF 20 to 25% as of 07/2021 Essential hypertension Ischemic cardiomyopathy EF 20 to 25% Appears euvolemic continue PTA Coreg, Farxiga, Entresto and spironolactone, and lasix F/u with chf clinic/cardiology   Aspiration pneumonia (HCC) History of COPD, not currently in exacerbation Tobacco abuse/dependence Started on iv abx, will switch to po to complete course On RA.  Cultures tdn     Dyslipidemia Continue statin   Type 2 diabetes mellitus with stage 3a chronic kidney disease, without long-term current use of insulin (HCC) Ck BG continue his [place].   Hypokalemia Received aldactone. Recheck tomorrow and replace as required.   Mental health condition, unspecified Continue home meds    Discharge Diagnoses:  Principal Problem:   Non-STEMI (non-ST elevated myocardial infarction) ([place]) Active Problems:   Aspiration pneumonia ([place])   Essential hypertension   Dyslipidemia   HFrEF (heart failure with reduced ejection fraction) ([place])  Ischemic cardiomyopathy   PAF (paroxysmal atrial fibrillation) (HCC)   Hypokalemia   Type 2 diabetes mellitus  with stage 3a chronic kidney disease, without long-term current use of insulin [facility])    Discharge Instructions  Discharge Instructions     AMB Referral to Cardiac Rehabilitation - Phase II   Complete by: As directed    Diagnosis:  Coronary Stents NSTEMI     After initial evaluation and assessments completed: Virtual Based Care may be provided alone or in conjunction with Phase 2 Cardiac Rehab based on patient barriers.: Yes   Intensive Cardiac Rehabilitation (ICR) [place] location only OR Traditional Cardiac Rehabilitation (TCR) *If criteria for ICR are not met will enroll in TCR [facility] only): Yes   Diet - low sodium heart healthy   Complete by: As directed    Increase activity slowly   Complete by: As directed       Allergies as of 07/19/2022   No Known Allergies      Medication List     STOP taking these medications    potassium chloride 10 MEQ tablet Commonly known as: KLOR-CON       TAKE these medications    Acetaminophen Extra Strength 500 MG Caps Take 1 capsule by mouth every 6 (six) hours as needed.   amoxicillin-clavulanate 875-125 MG tablet Commonly known as: AUGMENTIN Take 1 tablet by mouth every 12 (twelve) hours for 4 days.   apixaban 5 MG Tabs tablet Commonly known as: ELIQUIS Take 1 tablet (5 mg total) by mouth 2 (two) times daily.   atorvastatin 80 MG tablet Commonly known as: LIPITOR Take 80 mg by mouth at bedtime.   Biofreeze Cool The Pain 4 % Gel Generic drug: Menthol (Topical Analgesic) Apply 1 Application topically 3 (three) times daily as needed. Apply to right shoulder three times daily as needed for pain   carvedilol 3.125 MG tablet Commonly known as: COREG Take 1 tablet (3.125 mg total) by mouth 2 (two) times daily.   clopidogrel 75 MG tablet Commonly known as: PLAVIX Take 1 tablet (75 mg total) by mouth daily at 6 (six) AM. Start taking on: July 20, 2022   cyanocobalamin 1000 MCG tablet Commonly known as: VITAMIN B12 Take  1,000 mcg by mouth daily.   D 1000 25 MCG (1000 UT) capsule Generic drug: Cholecalciferol Take 1 capsule by mouth daily.   dapagliflozin propanediol 10 MG Tabs tablet Commonly known as: FARXIGA Take 1 tablet (10 mg total) by mouth daily.   DULoxetine 30 MG capsule Commonly known as: CYMBALTA Take 30 mg by mouth 2 (two) times daily.   Entresto 24-26 MG Generic drug: sacubitril-valsartan Take 1 tablet by mouth 2 (two) times daily.   ferrous sulfate 325 (65 FE) MG tablet Take 325 mg by mouth every other day.   furosemide 20 MG tablet Commonly known as: LASIX Take 1 tablet (20 mg total) by mouth daily.   hydrOXYzine 25 MG tablet Commonly known as: ATARAX Take 25 mg by mouth every 6 (six) hours as needed (allergy symptoms).   isosorbide mononitrate 30 MG 24 hr tablet Commonly known as: IMDUR Take 1 tablet (30 mg total) by mouth daily.   lamoTRIgine 25 MG tablet Commonly known as: LAMICTAL Take 25 mg by mouth 2 (two) times daily.   loratadine 10 MG tablet Commonly known as: CLARITIN Take 10 mg by mouth daily.   melatonin 3 MG Tabs tablet Take 5 mg by mouth at bedtime.   montelukast 10 MG tablet Commonly known  as: SINGULAIR Take 10 mg by mouth at bedtime.   nitroGLYCERIN 0.4 MG SL tablet Commonly known as: NITROSTAT Place 0.4 mg under the tongue every 5 (five) minutes as needed for chest pain.   pantoprazole 40 MG tablet Commonly known as: PROTONIX Take 40 mg by mouth daily.   PreserVision AREDS Caps Take 1 capsule by mouth in the morning and at bedtime.   spironolactone 25 MG tablet Commonly known as: ALDACTONE Take 1 tablet (25 mg total) by mouth daily.   Ventolin HFA 108 (90 Base) MCG/ACT inhaler Generic drug: albuterol Inhale 2 puffs into the lungs every 6 (six) hours as needed for wheezing or shortness of breath.               Durable Medical Equipment  (From admission, onward)           Start     Ordered   07/19/22 1215  For home use  only DME Walker rolling  Once       Question Answer Comment  Walker: With [place] Wheels   Patient needs a walker to treat with the following condition Unsteady gait   Patient needs a walker to treat with the following condition Impaired ambulation      07/19/22 1214   07/19/22 1214  For home use only DME Walker rolling  Once       Question Answer Comment  Walker: With [place]   Patient needs a walker to treat with the following condition Weakness      07/19/22 1214            Follow-up Information     Wellington Hampshire, MD Follow up in 1 week(s).   Specialty: Cardiology Contact information: [place] [place] [address] [phone]                No Known Allergies  Consultations: cardiology   Procedures/Studies: CARDIAC CATHETERIZATION  Result Date: 07/18/2022 Conclusions: Severe native coronary artery disease with chronic total occlusions of the mid LAD, mid LCx, and proximal RCA (unchanged from prior catheterization in 2021). Widely patent LIMA-LAD. Patent RIMA-RCA with 40% ostial stenosis. Patent Y graft supplying D2 and OM3 with 95% stenosis in the SVG portion to D2 just after the origin of radial-OM3. Normal left heart, right heart, and pulmonary artery pressures. Mildly reduced Fick cardiac output/index. Successful PCI to SVG-D2 using Onyx frontier 3.0 x 22 mm drug-eluting stent (postdilated to 3.6 mm) with 0% residual stenosis and TIMI-3 flow.  The stent jails the radial-OM3 limb, with stable 20% stenosis at the ostium of the graft. Occluded/heavily stenosed right radial artery that is inadequate for catheterization. Recommendations: Gentle post catheterization hydration in the setting of chronic kidney disease and contrast exposure for catheterization and CT yesterday evening. Restart heparin infusion 8 hours after sheath removal. Continue aspirin and clopidogrel overnight.  If no evidence of bleeding or vascular complication, would  recommend switching to apixaban 5 mg twice daily plus clopidogrel 75 mg daily for 12 months as soon as tomorrow. Aggressive secondary prevention of coronary artery disease. Optimize goal-directed medical therapy for chronic HFrEF, as blood pressure and renal function allow. Nelva Bush, MD [facility]  ECHOCARDIOGRAM COMPLETE  Result Date: 07/18/2022    ECHOCARDIOGRAM REPORT   Patient Name:   [facility] Date of Exam: 07/18/2022 Medical Rec #:  [id number]      Height:       72.0 in Accession #:    [id number]  Weight:       138.2 lb Date of Birth:  03/15/1946      BSA:          1.821 m Patient Age:    9 years       BP:           131/67 mmHg Patient Gender: M              HR:           74 bpm. Exam Location:  [facility] Procedure: 2D Echo, Color Doppler, Cardiac Doppler and Intracardiac            Opacification Agent Indications:     I21.4 NSTEMI  History:         Patient has prior history of Echocardiogram examinations, most                  recent 08/03/2021. Ischemic Cardiomyopathy, CAD, Prior CABG,                  CKD, COPD and PAD; Risk Factors:Diabetes, Current Smoker,                  Hypertension and Dyslipidemia.                  Aortic Valve: bioprosthetic valve is present in the aortic                  position.  Sonographer:     Charmayne Sheer Referring Phys:  [id number] [place] Diagnosing Phys: Kate Sable MD IMPRESSIONS  1. Left ventricular ejection fraction, by estimation, is 20 to 25%. The left ventricle has severely decreased function. The left ventricle demonstrates global hypokinesis. There is mild left ventricular hypertrophy. Left ventricular diastolic parameters  are consistent with Grade II diastolic dysfunction (pseudonormalization).  2. Right ventricular systolic function is normal. The right ventricular size is not well visualized.  3. The mitral valve is degenerative. No evidence of mitral valve regurgitation.  4. The aortic valve has been repaired/replaced. Aortic valve  regurgitation is not visualized. There is a bioprosthetic valve present in the aortic position.  5. Aortic dilatation noted. There is mild dilatation of the ascending aorta, measuring 40 mm.  6. The inferior vena cava is normal in size with greater than 50% respiratory variability, suggesting right atrial pressure of 3 mmHg. FINDINGS  Left Ventricle: Left ventricular ejection fraction, by estimation, is 20 to 25%. The left ventricle has severely decreased function. The left ventricle demonstrates global hypokinesis. Definity contrast agent was given IV to delineate the left ventricular endocardial borders. The left ventricular internal cavity size was normal in size. There is mild left ventricular hypertrophy. Left ventricular diastolic parameters are consistent with Grade II diastolic dysfunction (pseudonormalization). Right Ventricle: The right ventricular size is not well visualized. No increase in right ventricular wall thickness. Right ventricular systolic function is normal. Left Atrium: Left atrial size was normal in size. Right Atrium: Right atrial size was normal in size. Pericardium: There is no evidence of pericardial effusion. Mitral Valve: The mitral valve is degenerative in appearance. No evidence of mitral valve regurgitation. Tricuspid Valve: The tricuspid valve is not well visualized. Tricuspid valve regurgitation is not demonstrated. Aortic Valve: The aortic valve has been repaired/replaced. Aortic valve regurgitation is not visualized. Aortic valve mean gradient measures 6.0 mmHg. Aortic valve peak gradient measures 10.2 mmHg. Aortic valve area, by VTI measures 1.84 cm. There is a bioprosthetic valve present in the aortic  position. Pulmonic Valve: The pulmonic valve was not well visualized. Pulmonic valve regurgitation is not visualized. Aorta: Aortic dilatation noted. There is mild dilatation of the ascending aorta, measuring 40 mm. Venous: The inferior vena cava is normal in size with greater  than 50% respiratory variability, suggesting right atrial pressure of 3 mmHg. IAS/Shunts: No atrial level shunt detected by color flow Doppler.  LEFT VENTRICLE PLAX 2D LVIDd:         6.20 cm   Diastology LVIDs:         5.10 cm   LV e' medial:    3.59 cm/s LV PW:         1.10 cm   LV E/e' medial:  42.1 LV IVS:        1.30 cm   LV e' lateral:   6.42 cm/s LVOT diam:     1.90 cm   LV E/e' lateral: 23.5 LV SV:         51 LV SV Index:   28 LVOT Area:     2.84 cm  RIGHT VENTRICLE RV Basal diam:  3.20 cm LEFT ATRIUM           Index        RIGHT ATRIUM           Index LA diam:      3.90 cm 2.14 cm/m   RA Area:     12.00 cm LA Vol (A2C): 25.0 ml 13.73 ml/m  RA Volume:   26.70 ml  14.66 ml/m  AORTIC VALVE                     PULMONIC VALVE AV Area (Vmax):    1.91 cm      PV Vmax:       0.94 m/s AV Area (Vmean):   1.80 cm      PV Peak grad:  3.5 mmHg AV Area (VTI):     1.84 cm AV Vmax:           160.00 cm/s AV Vmean:          108.000 cm/s AV VTI:            0.280 m AV Peak Grad:      10.2 mmHg AV Mean Grad:      6.0 mmHg LVOT Vmax:         107.80 cm/s LVOT Vmean:        68.400 cm/s LVOT VTI:          0.182 m LVOT/AV VTI ratio: 0.65  AORTA Ao Root diam: 3.30 cm MITRAL VALVE MV Area (PHT): 5.90 cm     SHUNTS MV Decel Time: 129 msec     Systemic VTI:  0.18 m MV E velocity: 151.00 cm/s  Systemic Diam: 1.90 cm Kate Sable MD Electronically signed by Kate Sable MD Signature Date/Time: 07/18/2022/3:04:38 PM    Final    CT ABDOMEN PELVIS W CONTRAST  Result Date: 07/17/2022 CLINICAL DATA:  Epigastric pain EXAM: CT ABDOMEN AND PELVIS WITH CONTRAST TECHNIQUE: Multidetector CT imaging of the abdomen and pelvis was performed using the standard protocol following bolus administration of intravenous contrast. RADIATION DOSE REDUCTION: This exam was performed according to the departmental dose-optimization program which includes automated exposure control, adjustment of the mA and/or kV according to patient size and/or  use of iterative reconstruction technique. CONTRAST:  45m OMNIPAQUE IOHEXOL 350 MG/ML SOLN COMPARISON:  CT 07/15/2021, 10/17/2020 FINDINGS: Lower chest: Lung bases are degraded by  motion. No consolidative process, see separately dictated chest CT. Aortic valve prosthesis. Hepatobiliary: No focal liver abnormality is seen. Status post cholecystectomy. No biliary dilatation. Pancreas: Unremarkable. No pancreatic ductal dilatation or surrounding inflammatory changes. Spleen: Normal in size without focal abnormality. Adrenals/Urinary Tract: Adrenal glands are within normal limits. Kidneys show no hydronephrosis. Subcentimeter hypodense renal lesions too small to further characterize. Partially exophytic upper pole lesion on the right measuring 1.9 cm, contains slightly dense complex area, series 4, image 26. Mild wall thickening along the right bladder wall with slightly asymmetric mucosal enhancement, series 4, image 75. Stomach/Bowel: The stomach is nonenlarged. No dilated small bowel. No acute bowel wall thickening. Postsurgical changes in the left upper quadrant with chronically dilated segment of bowel in the region but no obstruction. Negative appendix Vascular/Lymphatic: Advanced aortic atherosclerosis. No aneurysm. Chronically occluded left SFA stent incompletely visualized. No suspicious lymph nodes. Reproductive: Prostate is unremarkable. Other: Negative for pelvic effusion or free air. Musculoskeletal: No acute osseous abnormality. IMPRESSION: 1. No CT evidence for acute intra-abdominal or pelvic abnormality. 2. 19 mm partially exophytic complex cystic lesion upper pole right kidney. When the patient is clinically stable and able to follow directions and hold their breath (preferably as an outpatient) further evaluation with dedicated abdominal MRI should be considered. 3. Mild wall thickening with slight asymmetric mucosal enhancement at the right posterior bladder wall, indeterminate for an area of  infection or inflammation. 4. Other chronic findings as discussed above Electronically Signed   By: Donavan Foil M.D.   On: 07/17/2022 23:16   CT Angio Chest PE W and/or Wo Contrast  Result Date: 07/17/2022 CLINICAL DATA:  Positive D-dimer with epigastric pain. EXAM: CT ANGIOGRAPHY CHEST WITH CONTRAST TECHNIQUE: Multidetector CT imaging of the chest was performed using the standard protocol during bolus administration of intravenous contrast. Multiplanar CT image reconstructions and MIPs were obtained to evaluate the vascular anatomy. RADIATION DOSE REDUCTION: This exam was performed according to the departmental dose-optimization program which includes automated exposure control, adjustment of the mA and/or kV according to patient size and/or use of iterative reconstruction technique. CONTRAST:  47m OMNIPAQUE IOHEXOL 350 MG/ML SOLN COMPARISON:  CT angiogram chest 08/03/2021 FINDINGS: Cardiovascular: There is mild aneurysmal dilatation of the ascending aorta measuring 4.1 cm which is unchanged. There are atherosclerotic calcifications of the aorta. Aortic valve replacement is present. The heart is mildly enlarged. There is no pericardial effusion. There are atherosclerotic calcifications of the aorta and coronary arteries. There is adequate opacification of the pulmonary arteries to the segmental level. No pulmonary embolism identified. Mediastinum/Nodes: No enlarged mediastinal, hilar, or axillary lymph nodes. Thyroid gland, trachea, and esophagus demonstrate no significant findings. Lungs/Pleura: There are minimal patchy airspace opacities in the right lower lobe. There is right lower lobe peribronchial wall thickening and plugging of bronchi similar to prior study. Right pleural effusion has resolved in the interval. There is minimal left lower lobe atelectasis. There is no evidence for pneumothorax. Upper Abdomen: Cholecystectomy clips are present. Musculoskeletal: No chest wall abnormality. No acute or  significant osseous findings. Sternotomy wires are present. Review of the MIP images confirms the above findings. IMPRESSION: 1. No evidence for pulmonary embolism. 2. 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 Recommend annual imaging followup by CTA or MRA. This recommendation follows 2010 ACCF/AHA/AATS/ACR/ASA/SCA/SCAI/SIR/STS/SVM Guidelines for the Diagnosis and Management of Patients with Thoracic Aortic Disease. Circulation. 2010; 121:: S937-D428 Aortic aneurysm NOS (ICD10-I71.9) 3. Right lower lobe peribronchial wall thickening, plugging of bronchi and minimal airspace  disease compatible with infectious/inflammatory process. Correlate clinically for aspiration. 4. Mild cardiomegaly. 5.  Aortic Atherosclerosis (ICD10-I70.0). Electronically Signed   By: Ronney Asters M.D.   On: 07/17/2022 23:13   DG Chest Port 1 View  Result Date: 07/17/2022 CLINICAL DATA:  Chest pain EXAM: PORTABLE CHEST 1 VIEW COMPARISON:  11/08/2021 FINDINGS: Prior CABG. Heart and mediastinal contours are within normal limits. No focal opacities or effusions. No acute bony abnormality. IMPRESSION: No active cardiopulmonary disease. Electronically Signed   By: Rolm Baptise M.D.   On: 07/17/2022 22:14      Subjective: No cp or sob  Discharge Exam: Vitals:   07/19/22 0800 07/19/22 1200  BP: 123/69 122/84  Pulse: 71 78  Resp: 20 20  Temp: 98.1 F (36.7 C) 97.9 F (36.6 C)  SpO2: 96%    Vitals:   07/18/22 2332 07/19/22 0434 07/19/22 0800 07/19/22 1200  BP: (!) 117/56 (!) 109/52 123/69 122/84  Pulse: 88 76 71 78  Resp: 20 20 20 20   Temp: 98.2 F (36.8 C) 98.5 F (36.9 C) 98.1 F (36.7 C) 97.9 F (36.6 C)  TempSrc:   Oral Oral  SpO2: 95% 94% 96%   Weight:      Height:        General: Pt is alert, awake, not in acute distress Cardiovascular: RRR, S1/S2 +, no rubs, no gallops Respiratory: CTA bilaterally, no wheezing, no rhonchi Abdominal: Soft, NT, ND, bowel sounds + Extremities:  no edema, no cyanosis    The results of significant diagnostics from this hospitalization (including imaging, microbiology, ancillary and laboratory) are listed below for reference.     Microbiology: Recent Results (from the past 240 hour(s))  Blood culture (routine x 2)     Status: None (Preliminary result)   Collection Time: 07/17/22 11:24 PM   Specimen: BLOOD  Result Value Ref Range Status   Specimen Description BLOOD LEFT FOREARM  Final   Special Requests   Final    BOTTLES DRAWN AEROBIC AND ANAEROBIC Blood Culture results may not be optimal due to an inadequate volume of blood received in culture bottles   Culture   Final    NO GROWTH 1 DAY Performed at [facility], [address], [place], [place] [address]    Report Status PENDING  Incomplete  Blood culture (routine x 2)     Status: None (Preliminary result)   Collection Time: 07/17/22 11:24 PM   Specimen: BLOOD  Result Value Ref Range Status   Specimen Description BLOOD RIGHT FOREARM  Final   Special Requests   Final    BOTTLES DRAWN AEROBIC AND ANAEROBIC Blood Culture results may not be optimal due to an inadequate volume of blood received in culture bottles   Culture   Final    NO GROWTH 1 DAY Performed at [facility], [address] [place], [place] [address]    Report Status PENDING  Incomplete  MRSA Next Gen by PCR, Nasal     Status: None   Collection Time: 07/18/22 12:18 PM   Specimen: Nasal Mucosa; Nasal Swab  Result Value Ref Range Status   MRSA by PCR Next Gen NOT DETECTED NOT DETECTED Final    Comment: (NOTE) The GeneXpert MRSA Assay (FDA approved for NASAL specimens only), is one component of a comprehensive MRSA colonization surveillance program. It is not intended to diagnose MRSA infection nor to guide or monitor treatment for MRSA infections. Test performance is not FDA approved in patients less than 2 years  old. Performed at [facility], [address]  [place], [place] [address]      Labs: BNP (last 3 results) Recent Labs    08/03/21 0355 11/08/21 1529  BNP 1,688.3* 1,660.6*   Basic Metabolic Panel: Recent Labs  Lab 07/17/22 2204 07/17/22 2324 07/18/22 0850 07/18/22 1858 07/19/22 0644  NA 142  --  144 142 140  K 3.4*  --  4.0 4.2 3.3*  CL 113*  --  114* 112* 114*  CO2 19*  --  21* 17* 19*  GLUCOSE 212*  --  145* 133* 210*  BUN 24*  --  20 20 18   CREATININE 1.62*  --  1.63* 1.56* 1.53*  CALCIUM 8.9  --  8.4* 8.4* 8.0*  MG  --  1.9  --   --   --    Liver Function Tests: Recent Labs  Lab 07/17/22 2204  AST 20  ALT 13  ALKPHOS 123  BILITOT 0.8  PROT 6.6  ALBUMIN 3.4*   Recent Labs  Lab 07/17/22 2204  LIPASE 77*   No results for input(s): "AMMONIA" in the last 168 hours. CBC: Recent Labs  Lab 07/17/22 2204 07/18/22 0850 07/19/22 0644  WBC 14.0* 14.2* 13.0*  HGB 14.2 14.8 12.7*  HCT 45.9 48.6 40.0  MCV 81.2 82.1 80.2  PLT 184 183 190   Cardiac Enzymes: No results for input(s): "CKTOTAL", "CKMB", "CKMBINDEX", "TROPONINI" in the last 168 hours. BNP: Invalid input(s): "POCBNP" CBG: Recent Labs  Lab 07/18/22 1215 07/18/22 1820 07/18/22 2055 07/19/22 0841 07/19/22 1207  GLUCAP 153* 118* 192* 140* 187*   D-Dimer Recent Labs    07/17/22 2204  DDIMER 1.21*   Hgb A1c Recent Labs    07/17/22 2204  HGBA1C 7.7*   Lipid Profile Recent Labs    07/18/22 0859  CHOL 164  HDL 44  LDLCALC 93  TRIG 135  CHOLHDL 3.7   Thyroid function studies No results for input(s): "TSH", "T4TOTAL", "T3FREE", "THYROIDAB" in the last 72 hours.  Invalid input(s): "FREET3" Anemia work up No results for input(s): "VITAMINB12", "FOLATE", "FERRITIN", "TIBC", "IRON", "RETICCTPCT" in the last 72 hours. Urinalysis    Component Value Date/Time   COLORURINE STRAW (A) 07/15/2021 1839   APPEARANCEUR CLEAR (A) 07/15/2021 1839   LABSPEC 1.014 07/15/2021 1839   PHURINE 5.0 07/15/2021 1839   GLUCOSEU 150 (A) 07/15/2021  1839   HGBUR NEGATIVE 07/15/2021 1839   BILIRUBINUR NEGATIVE 07/15/2021 1839   KETONESUR NEGATIVE 07/15/2021 1839   PROTEINUR NEGATIVE 07/15/2021 1839   NITRITE NEGATIVE 07/15/2021 1839   LEUKOCYTESUR NEGATIVE 07/15/2021 1839   Sepsis Labs Recent Labs  Lab 07/17/22 2204 07/18/22 0850 07/19/22 0644  WBC 14.0* 14.2* 13.0*   Microbiology Recent Results (from the past 240 hour(s))  Blood culture (routine x 2)     Status: None (Preliminary result)   Collection Time: 07/17/22 11:24 PM   Specimen: BLOOD  Result Value Ref Range Status   Specimen Description BLOOD LEFT FOREARM  Final   Special Requests   Final    BOTTLES DRAWN AEROBIC AND ANAEROBIC Blood Culture results may not be optimal due to an inadequate volume of blood received in culture bottles   Culture   Final    NO GROWTH 1 DAY Performed at [facility], [address], [place], [place] [address]    Report Status PENDING  Incomplete  Blood culture (routine x 2)     Status: None (Preliminary result)   Collection Time: 07/17/22 11:24 PM  Specimen: BLOOD  Result Value Ref Range Status   Specimen Description BLOOD RIGHT FOREARM  Final   Special Requests   Final    BOTTLES DRAWN AEROBIC AND ANAEROBIC Blood Culture results may not be optimal due to an inadequate volume of blood received in culture bottles   Culture   Final    NO GROWTH 1 DAY Performed at [facility], [address], [place], [place] [address]    Report Status PENDING  Incomplete  MRSA Next Gen by PCR, Nasal     Status: None   Collection Time: 07/18/22 12:18 PM   Specimen: Nasal Mucosa; Nasal Swab  Result Value Ref Range Status   MRSA by PCR Next Gen NOT DETECTED NOT DETECTED Final    Comment: (NOTE) The GeneXpert MRSA Assay (FDA approved for NASAL specimens only), is one component of a comprehensive MRSA colonization surveillance program. It is not intended to diagnose MRSA infection nor to guide or monitor treatment for MRSA  infections. Test performance is not FDA approved in patients less than 22 years old. Performed at [facility], [address], [place], [place] [address]      Time coordinating discharge: Over 30 minutes  SIGNED:   Nolberto Hanlon, MD  [facility] 07/19/2022, 1:08 PM Pager   If 7PM-7AM, please contact night-coverage [email or web address] Password [email or web address]

## 2022-07-19 NOTE — TOC Transition Note (Addendum)
Transition of Care [facility]) - CM/SW Discharge Note   Patient Details  Name: Shawn Jennings MRN: [id number] Date of Birth: 1945-11-13  Transition of [facility]) CM/SW Contact:  Alberteen Sam, LCSW Phone Number: 07/19/2022, 12:18 PM   Clinical Narrative:     Patient to dc back home to [facility] today. CSW attempted to call main number, no answer and went to VM that was full. CSW called Anderson Malta with [employer], received verbally aggressive language from her, stating she was on vacation and to not call her. CSW attempted to inform her of this patient's return, she called this CSW back and had facility on phone. They reported they can take patietn back. CSW inquired if they needed any paperwork prior to his return, at first they said no. Then she requested dc summary be faxed to [phone]. CSW informed them EMS will be called for patient, they report no other dc needs.   Per patient request RW was ordered via [employer] to be shipped to [facility]   Final next level of care: Assisted Living Barriers to Discharge: No Barriers Identified   Patient Goals and CMS Choice Patient states their goals for this hospitalization and ongoing recovery are:: to go home CMS Medicare.gov Compare Post Acute Care list provided to:: Patient Choice offered to / list presented to : Patient  Discharge Placement                    Patient and family notified of of transfer: 07/19/22  Discharge Plan and Services                DME Arranged: Gilford Rile DME Agency: [employer] Date DME Agency Contacted: 07/19/22   Representative spoke with at DME Agency: Suanne Marker            Social Determinants of Health ([place]) Interventions     Readmission Risk Interventions     No data to display

## 2022-07-19 NOTE — Progress Notes (Addendum)
ANTICOAGULATION CONSULT NOTE - Initial Consult  Pharmacy Consult for Heparin  Indication: chest pain/ACS  No Known Allergies  Patient Measurements: Height: 6' (182.9 cm) Weight: 63.4 kg (139 lb 12.4 oz) IBW/kg (Calculated) : 77.6 Heparin Dosing Weight: 62.7 kg   Vital Signs: Temp: 98.5 F (36.9 C) (09/28 0434) BP: 109/52 (09/28 0434) Pulse Rate: 76 (09/28 0434)  Labs: Recent Labs    07/17/22 2204 07/17/22 2206 07/17/22 2324 07/18/22 0425 07/18/22 0850 07/18/22 1858 07/19/22 0644  HGB 14.2  --   --   --  14.8  --  12.7*  HCT 45.9  --   --   --  48.6  --  40.0  PLT 184  --   --   --  183  --  190  APTT  --  31  --  56*  --   --  125*  LABPROT  --  15.7*  --   --   --   --   --   INR  --  1.3*  --   --   --   --   --   HEPARINUNFRC  --   --   --  >1.10*  --   --  >1.10*  CREATININE 1.62*  --   --   --  1.63* 1.56* 1.53*  TROPONINIHS 774*  --  1,889*  --   --   --   --      Estimated Creatinine Clearance: 35.7 mL/min (A) (by C-G formula based on SCr of 1.53 mg/dL (H)).   Medical History: Past Medical History:  Diagnosis Date   Aortic stenosis    a. Pt unaware of history but CT imaging 01/2019 consistent w/ AVR; b. 01/2019 Echo: Mild to mod AS. Mean grad 13mHg.   CAD (coronary artery disease)    [place]. [place] b. Reports multiple stress tests over the years w/o repeat cath; c. 01/2019 MV: large, sev, fixed inf and inflat defect extending to apex. No ischemia. EF 37%-->Med rx given atypical Ss and pt wishes.   CHF (congestive heart failure) (HCC)    CKD (chronic kidney disease), stage III (HCC)    COPD (chronic obstructive pulmonary disease) (HCC)    Depression    Essential hypertension    GERD (gastroesophageal reflux disease)    Hyperlipidemia LDL goal <70    Ischemic cardiomyopathy    a. 01/2019 Echo: EF 40-45%, impaired relaxation. Mildly dil LA. Sev mitral annular Ca2+. Mild to mod AS.   PAD ([place]ipheral artery disease) ([place]    a. 2016 s/p L  AKA.   PAF (paroxysmal atrial fibrillation) (HCC)    a. CHA2DS2VASc = 4-->Xarelto '15mg'$  daily in setting of CKD.   Thyrotoxicosis    Tobacco abuse    Type II diabetes mellitus (HCC)     Medications:  Apixaban 5 mg PO BID  Assessment: 76yo M with PMH CAD s/p CABGx4, ischemic cardiomyopathy, paroxysmal afib, and PAD. Pt admitted with ACS/NSTEMI after expericiencing sudden onset substernal chest pain, which patient described as worsening and continuous, sharp, and tearing. Pt received NTG x3 , nitropaste, and ASA from EMS. In the ED, troponins were 74 >> 1889. Pt on apixaban 5 mg PO BID PTA for Afib, last dose on 9/26 @ 2019. Pharmacy is consulted to manage transitioning from apixaban to heparin for ACS/NSTEMI. aPTT and HL are not correlating yet, so continuing to titrate by aPTT. Hgb, Hct, PLT stable.  Baseline Labs: aPTT - 31; INR -  1.3 Hgb - 14.2; Plts - 184  Goal of Therapy: aPTT = 66 - 102  Monitor platelets by anticoagulation protocol: Yes  Date Time aPTT HL Comment 9/27 0948 56 >1.10 Subtherapeutic 9/28 0644 125 >1.10 Supratherapeutic   Plan:  Decrease heparin infusion rate from 850 un/hr to 700 un/hr Check aPTT in 8 hours and anti-Xa with AM labs Titrate by aPTT's until lab correlation is noted, then titrate by anti-xa alone. Continue to monitor H&H and platelets daily while on heparin gtt.   Venora Maples, Pharmacy Student  07/19/22 8:10AM

## 2022-07-20 ENCOUNTER — Non-Acute Institutional Stay: Payer: Medicare Other | Admitting: Student

## 2022-07-20 DIAGNOSIS — Z515 Encounter for palliative care: Secondary | ICD-10-CM

## 2022-07-20 DIAGNOSIS — I214 Non-ST elevation (NSTEMI) myocardial infarction: Secondary | ICD-10-CM

## 2022-07-20 DIAGNOSIS — Z720 Tobacco use: Secondary | ICD-10-CM

## 2022-07-20 DIAGNOSIS — I5023 Acute on chronic systolic (congestive) heart failure: Secondary | ICD-10-CM

## 2022-07-20 NOTE — Progress Notes (Signed)
[facility] Consult Note Telephone: [phone]  Fax: [phone]    Date of encounter: 07/20/22 12:30PM PATIENT NAME: Shawn Jennings [id number]   [phone] (home)  DOB: 07-27-1946 MRN: [id number] PRIMARY CARE PROVIDER:    Estell Harpin, NP  REFERRING PROVIDER:   Estell Harpin, NP  RESPONSIBLE PARTY:    Contact Information     Name Relation Home Work [place] Other   [phone]        I met face to face with patient in the facility. Palliative Care was asked to follow this patient by consultation request of  Eventus Su Monks, NP to address advance care planning and complex medical decision making. This is a follow up visit.                                   ASSESSMENT AND PLAN / RECOMMENDATIONS:   Advance Care Planning/Goals of Care: Goals include to maximize quality of life and symptom management. Patient/health care surrogate gave his/her permission to discuss. Our advance care planning conversation included a discussion about:    The value and importance of advance care planning  Experiences with loved ones who have been seriously ill or have died  Exploration of personal, cultural or spiritual beliefs that might influence medical decisions  Exploration of goals of care in the event of a sudden injury or illness  CODE STATUS: Full Code  Education provided on Palliative Medicine. We discussed his code status; at facility he had elected DNR. During recent hospitalization, he was a Full Code. He states he would like to continue Full Code. We discussed what CPR would look like for patient.   Symptom Management/Plan:  Chronic systolic and diastolic heart failure-EF 20-25%. Monitor for worsening shortness of breath and edema. Patient only weighing routinely, recommend daily weights at facility. Continue carvedilol, Farxiga,  entresto, furosemide and spironolactone as directed. Follow up with HF clinic as scheduled.   NSTEMI- s/p cardiac cath. He is to take Eliquis and Plavix x 12 months. Continue carvedilol, Imdur and statin. Heart healthy, low sodium diet.   Smoking cessation-Education provided on smoking cessation. He is down to 6 cigarettes a day.   Follow up Palliative Care Visit: Palliative care will continue to follow for complex medical decision making, advance care planning, and clarification of goals. Return in 8 weeks or prn.   This visit was coded based on medical decision making (MDM).  PPS: 50%  HOSPICE ELIGIBILITY/DIAGNOSIS: TBD  Chief Complaint: Palliative Medicine follow up visit.   HISTORY OF PRESENT ILLNESS:  Shawn Jennings is a 76 y.o. year old male  with heart failure with reduced EF, hypertension, ischemic cardiomyopathy, atrial fibrillation, CKD, hx of COPD, aspiration pneumonia, T2DM, hx of GI bleed. Patient hospitalized 9/26-9/28/23 due to NSTEMI.  Patient resides at [facility]. He reports feeling better. He denies pain, chest pain, shortness of breath, edema. He reports weakness, but improving. He is still smoking; states he is down to 6 cigarettes a day. Patient observed leaving dining room from lunch. Pleasant affect, engages in conversation.   History obtained from review of EMR, discussion with primary team, and interview with family, facility staff/caregiver and/or Shawn Jennings.  I reviewed available labs, medications, imaging, studies and related documents from the EMR.  Records reviewed and summarized above.   ROS  A 10-Point ROS is  negative, except for the pertinent positives and negatives detailed per the HPI.    Physical Exam: Pulse 78, resp 16, b/p 124/76, sats 93% on room air Constitutional: NAD General: frail appearing EYES: anicteric sclera, lids intact, no discharge  ENMT: intact hearing, oral mucous membranes moist, dentition intact CV: S1S2, RRR, no LE  edema Pulmonary: LCTA, no increased work of breathing, no cough, room air Abdomen: normo-active BS + 4 quadrants, soft and non tender GU: deferred MSK: left AKA, w/c for locomotion Skin: warm and dry, no rashes or wounds on visible skin Neuro: + generalized weakness Psych: non-anxious affect, A and O x 3 Hem/lymph/immuno: no widespread bruising   Thank you for the opportunity to participate in the care of Shawn Jennings.  The palliative care team will continue to follow. Please call our office at [phone] if we can be of additional assistance.   Ezekiel Slocumb, NP   COVID-19 PATIENT SCREENING TOOL Asked and negative response unless otherwise noted:   Have you had symptoms of covid, tested positive or been in contact with someone with symptoms/positive test in the past 5-10 days? No

## 2022-07-24 LAB — CULTURE, BLOOD (ROUTINE X 2)
Culture: NO GROWTH
Culture: NO GROWTH

## 2022-07-29 NOTE — Progress Notes (Deleted)
Patient ID: Shawn Jennings, male    DOB: 07-23-1946, 76 y.o.   MRN: [id number]  HPI  Shawn Jennings is a 76 y/o male with a history of CAD, DM, hyperlipidemia, HTN, CKD, thyroid disease, COPD, depression, GERD, PAF, PAD, current tobacco use and chronic heart failure.   Echo report from 07/18/22 reviewed and showed an EF of 20-25% along with mild LVH. Echo report from 08/03/21 reviewed and showed an EF of 20-25% along with mild Shawn.   RHC done 07/18/22 and showed: Severe native coronary artery disease with chronic total occlusions of the mid LAD, mid LCx, and proximal RCA (unchanged from prior catheterization in 2021). Widely patent LIMA-LAD. Patent RIMA-RCA with 40% ostial stenosis. Patent Y graft supplying D2 and OM3 with 95% stenosis in the SVG portion to D2 just after the origin of radial-OM3. Normal left heart, right heart, and pulmonary artery pressures. Mildly reduced Fick cardiac output/index. Successful PCI to SVG-D2 using Onyx frontier 3.0 x 22 mm drug-eluting stent (postdilated to 3.6 mm) with 0% residual stenosis and TIMI-3 flow.  The stent jails the radial-OM3 limb, with stable 20% stenosis at the ostium of the graft. Occluded/heavily stenosed right radial artery that is inadequate for catheterization.  RHC/LHC done 06/06/20 and showed: Prox Cx to Mid Cx lesion is 100% stenosed. Mid LAD lesion is 100% stenosed. Prox RCA lesion is 100% stenosed. LIMA graft was visualized by non-selective angiography and is normal in caliber. The graft exhibits no disease. RIMA graft was visualized by non-selective angiography and is normal in caliber. The graft exhibits no disease.  Admitted 07/17/22 due to acute chest pain. 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 Cardiology consult obtained. Heparin infusion started. Cath done and  treated with PCI to the SVG. Discharged after 2 days.   He presents today for a follow-up visit with a chief complaint of    Past  Medical History:  Diagnosis Date   Aortic stenosis    a. Pt unaware of history but CT imaging 01/2019 consistent w/ AVR; b. 01/2019 Echo: Mild to mod AS. Mean grad 61mHg.   CAD (coronary artery disease)    [place] b. Reports multiple stress tests over the years w/o repeat cath; c. 01/2019 MV: large, sev, fixed inf and inflat defect extending to apex. No ischemia. EF 37%-->Med rx given atypical Ss and pt wishes.   CHF (congestive heart failure) (HCC)    CKD (chronic kidney disease), stage III (HCC)    COPD (chronic obstructive pulmonary disease) (HCC)    Depression    Essential hypertension    GERD (gastroesophageal reflux disease)    Hyperlipidemia LDL goal <70    Ischemic cardiomyopathy    a. 01/2019 Echo: EF 40-45%, impaired relaxation. Mildly dil LA. Sev mitral annular Ca2+. Mild to mod AS.   PAD ([place]ipheral artery disease) ([place]    a. 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   PAF (paroxysmal atrial fibrillation) (HCC)    a. CHA2DS2VASc = 4-->Xarelto '15mg'$  daily in setting of CKD.   Thyrotoxicosis    Tobacco abuse    Type II diabetes mellitus ([place]    Past Surgical History:  Procedure Laterality Date   CARDIAC CATHETERIZATION     CHOLECYSTECTOMY     COLONOSCOPY     COLONOSCOPY WITH PROPOFOL N/A 07/20/2021   Procedure: COLONOSCOPY WITH PROPOFOL;  Surgeon: VLin Landsman MD;  [facility];  Service: Gastroenterology;  Laterality: N/A;   CORONARY ARTERY BYPASS  GRAFT     a. [place]   CORONARY STENT INTERVENTION N/A 07/18/2022   Procedure: CORONARY STENT INTERVENTION;  Surgeon: Nelva Bush, MD;  Location: [place] [facility];  Service: Cardiovascular;  Laterality: N/A;   ENDOSCOPIC RETROGRADE CHOLANGIOPANCREATOGRAPHY (ERCP) WITH PROPOFOL N/A 10/19/2020   Procedure: ENDOSCOPIC RETROGRADE CHOLANGIOPANCREATOGRAPHY (ERCP) WITH PROPOFOL;  Surgeon: [name], MD;  Location: [facility] ENDOSCOPY;  Service: Endoscopy;  Laterality: N/A;   ERCP N/A 12/29/2020    Procedure: ENDOSCOPIC RETROGRADE CHOLANGIOPANCREATOGRAPHY (ERCP);  Surgeon: [name], MD;  Location[facility] ENDOSCOPY;  Service: Endoscopy;  Laterality: N/A;   ESOPHAGOGASTRODUODENOSCOPY (EGD) WITH PROPOFOL N/A 07/18/2021   Procedure: ESOPHAGOGASTRODUODENOSCOPY (EGD) WITH PROPOFOL;  Surgeon: Lin Landsman, MD;  Location: [facility] ENDOSCOPY;  Service: Gastroenterology;  Laterality: N/A;   Left AKA     RIGHT HEART CATH AND CORONARY/GRAFT ANGIOGRAPHY N/A 07/18/2022   Procedure: RIGHT HEART CATH AND CORONARY/GRAFT ANGIOGRAPHY;  Surgeon: Nelva Bush, MD;  Location: [place] [facility];  Service: Cardiovascular;  Laterality: N/A;   RIGHT/LEFT HEART CATH AND CORONARY ANGIOGRAPHY N/A 06/06/2020   Procedure: RIGHT/LEFT HEART CATH AND CORONARY ANGIOGRAPHY;  Surgeon: Wellington Hampshire, MD;  Location: [place] CV LAB;  Service: Cardiovascular;  Laterality: N/A;   Family History  Problem Relation Age of Onset   Heart attack Mother        died @ 32.   Other Father        never knew his father.   Other Sister        overdose of sleeping pills.   Heart disease Brother        died in his 56's   Other Sister        complication of abd surgery @ 16   CAD Sister    Social History   Tobacco Use   Smoking status: Every Day    Packs/day: 1.00    Years: 62.00    Total pack years: 62.00    Types: Cigarettes   Smokeless tobacco: Never  Substance Use Topics   Alcohol use: Never   No Known Allergies    Review of Systems  Constitutional:  Negative for appetite change, fatigue and unexpected weight change.  HENT:  Negative for congestion, postnasal drip and sore throat.   Eyes: Negative.   Respiratory:  Negative for cough and chest tightness.   Cardiovascular:  Negative for chest pain and leg swelling.  Gastrointestinal:  Negative for abdominal distention, constipation and diarrhea.  Endocrine: Negative.   Genitourinary: Negative.  Negative for difficulty urinating.  Musculoskeletal:   Negative for back pain and neck pain.  Skin: Negative.   Allergic/Immunologic: Negative.   Neurological:  Negative for dizziness and light-headedness.  Hematological:  Negative for adenopathy. Does not bruise/bleed easily.  Psychiatric/Behavioral:  Negative for dysphoric mood and sleep disturbance (sleeping on 2 pillows). The patient is not nervous/anxious.        Physical Exam Vitals and nursing note reviewed.  Constitutional:      Appearance: Normal appearance.  HENT:     Head: Normocephalic and atraumatic.  Cardiovascular:     Rate and Rhythm: Normal rate and regular rhythm.  Pulmonary:     Effort: Pulmonary effort is normal. No respiratory distress.     Breath sounds: Rales present. No wheezing.  Abdominal:     General: There is no distension.     Palpations: Abdomen is soft.     Tenderness: There is no abdominal tenderness.  Musculoskeletal:  General: Deformity (left AKA) present.     Cervical back: Normal range of motion and neck supple.     Right lower leg: No edema.  Skin:    General: Skin is warm and dry.  Neurological:     General: No focal deficit present.     Mental Status: He is alert and oriented to person, place, and time.  Psychiatric:        Mood and Affect: Mood normal.        Behavior: Behavior normal.        Thought Content: Thought content normal.     Assessment & Plan:  1: Chronic heart failure with reduced ejection fraction- - NYHA class I - euvolemic today - getting weighed most days and weighs 135-136 pounds; reminded to call for an overnight weight gain of > 2 pounds or a weekly weight gain of >5 pounds - weight chart reviewed - not adding salt but admits that he "loves" salt; encouraged him to continue to not add any - on GDMT of farxiga, carvedilol, entresto & spironolactone - palliative care visit done 07/20/22 - BNP 11/08/21 was 2623.7  2: HTN- - BP  - PCP is [facility] - BMP 07/19/22 reviewed and showed sodium  140, potassium 3.3, creatinine 1.53 and GFR 46  3: DM- - A1c 07/17/22 was 7.7%  4: Atrial fibrillation- - currently taking apixaban  5: NSTEMI- - recent PCI with stent - peak troponin was 1889 - sees cardiology Sharolyn Douglas) 07/31/22  6: Tobacco use- - smoking 5-6 cigarettes daily & has been smoking since the age of 20 - no desire to quit - denies alcohol    Facility medication list reviewed.

## 2022-07-30 ENCOUNTER — Telehealth: Payer: Self-pay | Admitting: Family

## 2022-07-30 ENCOUNTER — Ambulatory Visit: Payer: Medicare Other | Admitting: Family

## 2022-07-30 NOTE — Telephone Encounter (Signed)
Patient did not show for his Heart Failure Clinic appointment on 07/30/22. Will attempt to reschedule.

## 2022-07-31 ENCOUNTER — Ambulatory Visit: Payer: Medicare Other | Attending: Nurse Practitioner | Admitting: Nurse Practitioner

## 2022-07-31 ENCOUNTER — Encounter: Payer: Self-pay | Admitting: Nurse Practitioner

## 2022-07-31 NOTE — Progress Notes (Deleted)
Office Visit    Patient Name: Shawn Jennings Date of Encounter: 07/31/2022  Primary Care Provider:  Pcp, No Primary Cardiologist:  [name], MD  Chief Complaint    76 year old male with history of CAD and aortic stenosis status post CABG and bioprosthetic aortic valve replacement in 1994 [place], chronic HFrEF, ischemic cardiomyopathy, paroxysmal atrial fibrillation, peripheral arterial disease status post left AKA, stage III chronic kidney disease, type 2 diabetes mellitus, hypertension, hyperlipidemia, COPD, tobacco use, GERD, and depression, who presents for follow-up after recent hospitalization for non-STEMI and PCI.  Past Medical History    Past Medical History:  Diagnosis Date   Acquired dilation of ascending aorta and aortic root ([place])    a. 06/2022 Echo: Asc Ao 36m.   Aortic stenosis    a. Pt unaware of history but CT imaging 01/2019 consistent w/ AVR; b. 01/2019 Echo: Mild to mod AS. Mean grad 169mg; c. 06/2022 Echo: Nl fxn'ing prosth AoV. Asc Ao 4040m  CAD (coronary artery disease)    [place]. [place] Cath: Sev 3VD, patent grafts->Med Rx; c. 06/2022 NSTEMI/PCI: LM diff dzs, LAD 100m34m 40, LCX 100p/m, OM3 mod dzs, RCA 100p, LIMA->LAD ok, RIMA->RPDA 40ost, VG->D2 30p/95p (3.0x22 Onyx Frontier DES)--< L Radial-OM3 20ost.   Chronic HFrEF (heart failure with reduced ejection fraction) (HCC)[place] a. 01/2019 Echo: EF 40-45%; b. 04/2020 Echo: EF 25-30%; c. 09/2020 Echo: EF 50-55%; d. 07/2021 Echo: EF 20-25%; e. 06/2022 Echo: EF 20-25%, glob HK, mild LVH, GrII DD, nl RV fxn, nl fxn'ing bioprosth AoV, Asc Ao 40mm15mCKD (chronic kidney disease), stage III (HCC)    COPD (chronic obstructive pulmonary disease) (HCC) [place]    Essential hypertension    GERD (gastroesophageal reflux disease)    Hyperlipidemia LDL goal <70    Ischemic cardiomyopathy    a. 01/2019 Echo: EF 40-45%; b. 04/2020 Echo: EF 25-30%; c. 09/2020 Echo: EF 50-55%; d. 07/2021 Echo:  EF 20-25%; e. 06/2022 Echo: EF 20-25%.   PAD (peripheral artery disease) (HCC) [place]. 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   PAF (paroxysmal atrial fibrillation) (HCC)    a. CHA2DS2VASc = 4-->Xarelto '15mg'$  daily in setting of CKD.   Thyrotoxicosis    Tobacco abuse    Type II diabetes mellitus (HCC) [place]:  Procedure Laterality Date   CARDIAC CATHETERIZATION     CHOLECYSTECTOMY     COLONOSCOPY     COLONOSCOPY WITH PROPOFOL N/A 07/20/2021   Procedure: COLONOSCOPY WITH PROPOFOL;  Surgeon: VangaLin Landsman  [facility];  Service: Gastroenterology;  Laterality: N/A;   CORONARY ARTERY BYPASS GRAFT     a. 1994 [place] 07/18/2022   Procedure: CORONARY STENT INTERVENTION;  Surgeon: End, Nelva Bush  [facility];  Service: Cardiovascular;  Laterality: N/A;   ENDOSCOPIC RETROGRADE CHOLANGIOPANCREATOGRAPHY (ERCP) WITH PROPOFOL N/A 10/19/2020   Procedure: ENDOSCOPIC RETROGRADE CHOLANGIOPANCREATOGRAPHY (ERCP) WITH PROPOFOL;  Surgeon: Wohl,[name]  Location: [facility];  Service: Endoscopy;  Laterality: N/A;   ERCP N/A 12/29/2020   Procedure: ENDOSCOPIC RETROGRADE CHOLANGIOPANCREATOGRAPHY (ERCP);  Surgeon: Wohl,[name]  [facility];  Service: Endoscopy;  Laterality: N/A;   ESOPHAGOGASTRODUODENOSCOPY (EGD) WITH PROPOFOL N/A 07/18/2021   Procedure: ESOPHAGOGASTRODUODENOSCOPY (EGD) WITH PROPOFOL;  Surgeon: VangaLin Landsman  [facility];  Service: Gastroenterology;  Laterality: N/A;   Left AKA  RIGHT HEART CATH AND CORONARY/GRAFT ANGIOGRAPHY N/A 07/18/2022   Procedure: RIGHT HEART CATH AND CORONARY/GRAFT ANGIOGRAPHY;  Surgeon: Nelva Bush, MD;  Location: [place] [facility];  Service: Cardiovascular;  Laterality: N/A;   RIGHT/LEFT HEART CATH AND CORONARY ANGIOGRAPHY N/A 06/06/2020   Procedure: RIGHT/LEFT HEART CATH AND CORONARY ANGIOGRAPHY;  Surgeon: Wellington Hampshire, MD;   Location: [place] [facility];  Service: Cardiovascular;  Laterality: N/A;    Allergies  No Known Allergies  History of Present Illness    76 year old male with a history of CAD and aortic stenosis status post CABG and bioprosthetic aortic valve replacement in 1994 in [place], chronic HFrEF, ischemic cardiomyopathy, paroxysmal atrial fibrillation, peripheral arterial disease status post left AKA, stage III chronic kidney disease, type 2 diabetes mellitus, hypertension, hyperlipidemia, COPD, tobacco abuse, GERD, and depression.  In April 2020, he was admitted to [facility] with chest pain.  Stress testing was potentially high risk with a large severe fixed inferior and inferolateral defect extending to the apex but without ischemia.  EF was 37% (40-45% by echo).  Based on his preference, he was medically managed.  He was readmitted with pleuritic chest discomfort in April 2021.  Barium esophagram showed small hiatal hernia.  He subsequently had follow-up echo that showed newly reduced EF of 25 to 30% with global hypokinesis.  He underwent right and left heart cardiac catheterization August 2021 showing severe underlying coronary artery disease with patent grafts and normal filling pressures.  He was medically managed.  In December 2021, he was admitted with abdominal pain and choledocholithiasis and underwent ERCP.  In September 2022, he was admitted with upper GI bleed requiring 2 units of packed red blood cells.  EGD showed erosive gastropathy without acute bleeding.  Colonoscopy showed Nonna bleeding hemorrhoids.  He was cleared to resume Eliquis by GI.  In January 2023, he was admitted with chest pain and dyspnea.  He was noted to have more pronounced inferior and lateral ST changes.  Troponin peaked at 283.  He was volume overloaded and required diuresis with subsequent improvement in chest pain and dyspnea and he was medically managed.  Shawn Jennings was admitted to [facility] July 17, 2022 with chest pain  and non-STEMI.  Repeat echo showed persistent LV dysfunction with an EF of 20 to 25% with global hypokinesis, grade 2 diastolic dysfunction, normal functioning bioprosthetic aortic valve.  The ascending aorta was dilated at 40 mm.  Diagnostic catheterization was performed and again showed native multivessel coronary artery disease however he had a new stenosis in the vein graft to the diagonal chest after the Y radial graft to the third obtuse marginal.  The vein graft to the diagonal was successfully stented with a 3.0 x 22 mm Onyx frontier drug-eluting stent.  He was discharged home on September 28 on Eliquis and Plavix times at least 12 months.  Creatinine was stable throughout admission.  Since discharge, ***  Home Medications    Current Outpatient Medications  Medication Sig Dispense Refill   ACETAMINOPHEN EXTRA STRENGTH 500 MG capsule Take 1 capsule by mouth every 6 (six) hours as needed.     albuterol (VENTOLIN HFA) 108 (90 Base) MCG/ACT inhaler Inhale 2 puffs into the lungs every 6 (six) hours as needed for wheezing or shortness of breath.      apixaban (ELIQUIS) 5 MG TABS tablet Take 1 tablet (5 mg total) by mouth 2 (two) times daily. 60 tablet 0   atorvastatin (LIPITOR) 80 MG tablet Take 80 mg  by mouth at bedtime.     BIOFREEZE COOL THE PAIN 4 % GEL Apply 1 Application topically 3 (three) times daily as needed. Apply to right shoulder three times daily as needed for pain     carvedilol (COREG) 3.125 MG tablet Take 1 tablet (3.125 mg total) by mouth 2 (two) times daily. 60 tablet 3   clopidogrel (PLAVIX) 75 MG tablet Take 1 tablet (75 mg total) by mouth daily at 6 (six) AM. 30 tablet 0   D 1000 25 MCG (1000 UT) capsule Take 1 capsule by mouth daily.     dapagliflozin propanediol (FARXIGA) 10 MG TABS tablet Take 1 tablet (10 mg total) by mouth daily. 30 tablet 5   DULoxetine (CYMBALTA) 30 MG capsule Take 30 mg by mouth 2 (two) times daily.     ferrous sulfate 325 (65 FE) MG tablet Take 325 mg  by mouth every other day.     furosemide (LASIX) 20 MG tablet Take 1 tablet (20 mg total) by mouth daily. 30 tablet 0   hydrOXYzine (ATARAX/VISTARIL) 25 MG tablet Take 25 mg by mouth every 6 (six) hours as needed (allergy symptoms).     isosorbide mononitrate (IMDUR) 30 MG 24 hr tablet Take 1 tablet (30 mg total) by mouth daily. 30 tablet 0   lamoTRIgine (LAMICTAL) 25 MG tablet Take 25 mg by mouth 2 (two) times daily.     loratadine (CLARITIN) 10 MG tablet Take 10 mg by mouth daily.     Melatonin 3 MG TABS Take 5 mg by mouth at bedtime.     montelukast (SINGULAIR) 10 MG tablet Take 10 mg by mouth at bedtime.     Multiple Vitamins-Minerals (PRESERVISION AREDS) CAPS Take 1 capsule by mouth in the morning and at bedtime.     nitroGLYCERIN (NITROSTAT) 0.4 MG SL tablet Place 0.4 mg under the tongue every 5 (five) minutes as needed for chest pain.     pantoprazole (PROTONIX) 40 MG tablet Take 40 mg by mouth daily.     sacubitril-valsartan (ENTRESTO) 24-26 MG Take 1 tablet by mouth 2 (two) times daily.     spironolactone (ALDACTONE) 25 MG tablet Take 1 tablet (25 mg total) by mouth daily. 90 tablet 3   vitamin B-12 (CYANOCOBALAMIN) 1000 MCG tablet Take 1,000 mcg by mouth daily.     No current facility-administered medications for this visit.     Review of Systems    ***.  All other systems reviewed and are otherwise negative except as noted above. {The patient has an active order for outpatient cardiac rehabilitation.   Please indicate if the patient is ready to start. Do NOT delete this.  It will auto delete.  Refresh note, then sign.              Click here to document readiness and see contraindications.  :1}  Cardiac Rehabilitation Eligibility Assessment      Physical Exam    VS:  There were no vitals taken for this visit. , BMI There is no height or weight on file to calculate BMI.     GEN: Well nourished, well developed, in no acute distress. HEENT: normal. Neck: Supple, no JVD,  carotid bruits, or masses. Cardiac: RRR, no murmurs, rubs, or gallops. No clubbing, cyanosis, edema.  Radials/DP/PT 2+ and equal bilaterally.  Respiratory:  Respirations regular and unlabored, clear to auscultation bilaterally. GI: Soft, nontender, nondistended, BS + x 4. MS: no deformity or atrophy. Skin: warm and dry, no rash. Neuro:  Strength and sensation are intact. Psych: Normal affect.  Accessory Clinical Findings    ECG personally reviewed by me today - *** - no acute changes.  Lab Results  Component Value Date   WBC 13.0 (H) 07/19/2022   HGB 12.7 (L) 07/19/2022   HCT 40.0 07/19/2022   MCV 80.2 07/19/2022   PLT 190 07/19/2022   Lab Results  Component Value Date   CREATININE 1.53 (H) 07/19/2022   BUN 18 07/19/2022   NA 140 07/19/2022   K 3.3 (L) 07/19/2022   CL 114 (H) 07/19/2022   CO2 19 (L) 07/19/2022   Lab Results  Component Value Date   ALT 13 07/17/2022   AST 20 07/17/2022   ALKPHOS 123 07/17/2022   BILITOT 0.8 07/17/2022   Lab Results  Component Value Date   CHOL 164 07/18/2022   HDL 44 07/18/2022   LDLCALC 93 07/18/2022   TRIG 135 07/18/2022   CHOLHDL 3.7 07/18/2022    Lab Results  Component Value Date   HGBA1C 7.7 (H) 07/17/2022    Assessment & Plan    1.  ***   Murray Hodgkins, NP 07/31/2022, 8:05 AM

## 2022-08-01 ENCOUNTER — Encounter: Payer: Self-pay | Admitting: Nurse Practitioner

## 2022-08-08 DIAGNOSIS — I70219 Atherosclerosis of native arteries of extremities with intermittent claudication, unspecified extremity: Secondary | ICD-10-CM | POA: Insufficient documentation

## 2022-08-08 NOTE — Progress Notes (Deleted)
MRN : [id number]  Shawn Jennings is a 76 y.o. ([birth year]) male who presents with chief complaint of check circulation.  History of Present Illness:    The patient is seen for evaluation of painful lower extremities and diminished pulses. Patient notes the pain is always associated with activity and is very consistent day today. Typically, the pain occurs at less than one block, progress is as activity continues to the point that the patient must stop walking. Resting including standing still for several minutes allows the patient to walk a similar distance before being forced to stop again. Uneven terrain and inclines shorten the distance. The pain has been progressive over the past several years. The patient denies any abrupt changes in claudication symptoms.  The patient states the inability to walk is causing problems with daily activities.  The patient denies rest pain or dangling of an extremity off the side of the bed during the night for relief. No open wounds or sores at this time. No prior interventions or surgeries.  No history of back problems or DJD of the lumbar sacral spine.   The patient's blood pressure has been stable and relatively well controlled. The patient denies amaurosis fugax or recent TIA symptoms. There are no recent neurological changes noted. The patient denies history of DVT, PE or superficial thrombophlebitis. The patient denies recent episodes of angina or shortness of breath.   No outpatient medications have been marked as taking for the 08/13/22 encounter (Appointment) with Delana Meyer, Dolores Lory, MD.    Past Medical History:  Diagnosis Date   Acquired dilation of ascending aorta and aortic root ([place])    a. 06/2022 Echo: Asc Ao 82m.   Aortic stenosis    a. Pt unaware of history but CT imaging 01/2019 consistent w/ AVR; b. 01/2019 Echo: Mild to mod AS. Mean grad 160mg; c. 06/2022 Echo: Nl fxn'ing prosth AoV. Asc Ao 4073m  CAD (coronary  artery disease)    [place]. [place] Cath: Sev 3VD, patent grafts->Med Rx; c. 06/2022 NSTEMI/PCI: LM diff dzs, LAD 100m49m 40, LCX 100p/m, OM3 mod dzs, RCA 100p, LIMA->LAD ok, RIMA->RPDA 40ost, VG->D2 30p/95p (3.0x22 Onyx Frontier DES)--< L Radial-OM3 20ost.   Chronic HFrEF (heart failure with reduced ejection fraction) (HCC)[place] a. 01/2019 Echo: EF 40-45%; b. 04/2020 Echo: EF 25-30%; c. 09/2020 Echo: EF 50-55%; d. 07/2021 Echo: EF 20-25%; e. 06/2022 Echo: EF 20-25%, glob HK, mild LVH, GrII DD, nl RV fxn, nl fxn'ing bioprosth AoV, Asc Ao 40mm75mCKD (chronic kidney disease), stage III (HCC)    COPD (chronic obstructive pulmonary disease) (HCC) [place]    Essential hypertension    GERD (gastroesophageal reflux disease)    Hyperlipidemia LDL goal <70    Ischemic cardiomyopathy    a. 01/2019 Echo: EF 40-45%; b. 04/2020 Echo: EF 25-30%; c. 09/2020 Echo: EF 50-55%; d. 07/2021 Echo: EF 20-25%; e. 06/2022 Echo: EF 20-25%.   PAD (peripheral artery disease) (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   PAF (paroxysmal atrial fibrillation) (HCC)    a. CHA2DS2VASc = 4-->Xarelto '15mg'$  daily in setting of CKD.   Thyrotoxicosis    Tobacco abuse    Type II diabetes mellitus (HCC) [place] Past Surgical History:  Procedure Laterality Date   CARDIAC CATHETERIZATION     CHOLECYSTECTOMY     COLONOSCOPY  COLONOSCOPY WITH PROPOFOL N/A 07/20/2021   Procedure: COLONOSCOPY WITH PROPOFOL;  Surgeon: Lin Landsman, MD;  Location: [facility] ENDOSCOPY;  Service: Gastroenterology;  Laterality: N/A;   CORONARY ARTERY BYPASS GRAFT     a. [place], [place]   CORONARY STENT INTERVENTION N/A 07/18/2022   Procedure: CORONARY STENT INTERVENTION;  Surgeon: Nelva Bush, MD;  Location: [place] [facility] LAB;  Service: Cardiovascular;  Laterality: N/A;   ENDOSCOPIC RETROGRADE CHOLANGIOPANCREATOGRAPHY (ERCP) WITH PROPOFOL N/A 10/19/2020   Procedure: ENDOSCOPIC RETROGRADE CHOLANGIOPANCREATOGRAPHY (ERCP) WITH PROPOFOL;   Surgeon: [name], MD;  Location: [facility] ENDOSCOPY;  Service: Endoscopy;  Laterality: N/A;   ERCP N/A 12/29/2020   Procedure: ENDOSCOPIC RETROGRADE CHOLANGIOPANCREATOGRAPHY (ERCP);  Surgeon: [name], MD;  Location: [facility];  Service: Endoscopy;  Laterality: N/A;   ESOPHAGOGASTRODUODENOSCOPY (EGD) WITH PROPOFOL N/A 07/18/2021   Procedure: ESOPHAGOGASTRODUODENOSCOPY (EGD) WITH PROPOFOL;  Surgeon: Lin Landsman, MD;  Location: [facility] ENDOSCOPY;  Service: Gastroenterology;  Laterality: N/A;   Left AKA     RIGHT HEART CATH AND CORONARY/GRAFT ANGIOGRAPHY N/A 07/18/2022   Procedure: RIGHT HEART CATH AND CORONARY/GRAFT ANGIOGRAPHY;  Surgeon: Nelva Bush, MD;  Location: [name] [facility];  Service: Cardiovascular;  Laterality: N/A;   RIGHT/LEFT HEART CATH AND CORONARY ANGIOGRAPHY N/A 06/06/2020   Procedure: RIGHT/LEFT HEART CATH AND CORONARY ANGIOGRAPHY;  Surgeon: Wellington Hampshire, MD;  Location: [place] [facility];  Service: Cardiovascular;  Laterality: N/A;    Social History Social History   Tobacco Use   Smoking status: Every Day    Packs/day: 1.00    Years: 62.00    Total pack years: 62.00    Types: Cigarettes   Smokeless tobacco: Never  Vaping Use   Vaping Use: Never used  Substance Use Topics   Alcohol use: Never   Drug use: Never    Family History Family History  Problem Relation Age of Onset   Heart attack Mother        died @ 69.   Other Father        never knew his father.   Other Sister        overdose of sleeping pills.   Heart disease Brother        died in his 76's   Other Sister        complication of abd surgery @ 58   CAD Sister     No Known Allergies   REVIEW OF SYSTEMS (Negative unless checked)  Constitutional: '[]'$ Weight loss  '[]'$ Fever  '[]'$ Chills Cardiac: '[]'$ Chest pain   '[]'$ Chest pressure   '[]'$ Palpitations   '[]'$ Shortness of breath when laying flat   '[]'$ Shortness of breath with exertion. Vascular:  '[x]'$ Pain in legs with walking   '[]'$ Pain  in legs at rest  '[]'$ History of DVT   '[]'$ Phlebitis   '[]'$ Swelling in legs   '[]'$ Varicose veins   '[]'$ Non-healing ulcers Pulmonary:   '[]'$ Uses home oxygen   '[]'$ Productive cough   '[]'$ Hemoptysis   '[]'$ Wheeze  '[]'$ COPD   '[]'$ Asthma Neurologic:  '[]'$ Dizziness   '[]'$ Seizures   '[]'$ History of stroke   '[]'$ History of TIA  '[]'$ Aphasia   '[]'$ Vissual changes   '[]'$ Weakness or numbness in arm   '[]'$ Weakness or numbness in leg Musculoskeletal:   '[]'$ Joint swelling   '[]'$ Joint pain   '[]'$ Low back pain Hematologic:  '[]'$ Easy bruising  '[]'$ Easy bleeding   '[]'$ Hypercoagulable state   '[]'$ Anemic Gastrointestinal:  '[]'$ Diarrhea   '[]'$ Vomiting  '[x]'$ Gastroesophageal reflux/heartburn   '[]'$ Difficulty swallowing. Genitourinary:  '[]'$ Chronic kidney disease   '[]'$ Difficult urination  '[]'$ Frequent urination   '[]'$   Blood in urine Skin:  '[]'$ Rashes   '[]'$ Ulcers  Psychological:  '[]'$ History of anxiety   '[]'$  History of major depression.  Physical Examination  There were no vitals filed for this visit. There is no height or weight on file to calculate BMI. Gen: WD/WN, NAD Head: [place]/AT, No temporalis wasting.  Ear/Nose/Throat: Hearing grossly intact, nares w/o erythema or drainage Eyes: PER, EOMI, sclera nonicteric.  Neck: Supple, no masses.  No bruit or JVD.  Pulmonary:  Good air movement, no audible wheezing, no use of accessory muscles.  Cardiac: RRR, normal S1, S2, no Murmurs. Vascular:  mild trophic changes, no open wounds Vessel Right Left  Radial Palpable Palpable  PT Not Palpable Not Palpable  DP Not Palpable Not Palpable  Gastrointestinal: soft, non-distended. No guarding/no peritoneal signs.  Musculoskeletal: M/S 5/5 throughout.  No visible deformity.  Neurologic: CN 2-12 intact. Pain and light touch intact in extremities.  Symmetrical.  Speech is fluent. Motor exam as listed above. Psychiatric: Judgment intact, Mood & affect appropriate for pt's clinical situation. Dermatologic: No rashes or ulcers noted.  No changes consistent with cellulitis.   CBC Lab Results  Component  Value Date   WBC 13.0 (H) 07/19/2022   HGB 12.7 (L) 07/19/2022   HCT 40.0 07/19/2022   MCV 80.2 07/19/2022   PLT 190 07/19/2022    BMET    Component Value Date/Time   NA 140 07/19/2022 0644   NA 144 06/17/2020 1159   K 3.3 (L) 07/19/2022 0644   CL 114 (H) 07/19/2022 0644   CO2 19 (L) 07/19/2022 0644   GLUCOSE 210 (H) 07/19/2022 0644   BUN 18 07/19/2022 0644   BUN 18 06/17/2020 1159   CREATININE 1.53 (H) 07/19/2022 0644   CALCIUM 8.0 (L) 07/19/2022 0644   GFRNONAA 46 (L) 07/19/2022 0644   GFRAA 44 (L) 06/17/2020 1159   CrCl cannot be calculated (Unknown ideal weight.).  COAG Lab Results  Component Value Date   INR 1.3 (H) 07/17/2022   INR 1.2 11/08/2021   INR 2.2 (H) 07/15/2021    Radiology CARDIAC CATHETERIZATION  Result Date: 07/18/2022 Conclusions: Severe native coronary artery disease with chronic total occlusions of the mid LAD, mid LCx, and proximal RCA (unchanged from prior catheterization in 2021). Widely patent LIMA-LAD. Patent RIMA-RCA with 40% ostial stenosis. Patent Y graft supplying D2 and OM3 with 95% stenosis in the SVG portion to D2 just after the origin of radial-OM3. Normal left heart, right heart, and pulmonary artery pressures. Mildly reduced Fick cardiac output/index. Successful PCI to SVG-D2 using Onyx frontier 3.0 x 22 mm drug-eluting stent (postdilated to 3.6 mm) with 0% residual stenosis and TIMI-3 flow.  The stent jails the radial-OM3 limb, with stable 20% stenosis at the ostium of the graft. Occluded/heavily stenosed right radial artery that is inadequate for catheterization. Recommendations: Gentle post catheterization hydration in the setting of chronic kidney disease and contrast exposure for catheterization and CT yesterday evening. Restart heparin infusion 8 hours after sheath removal. Continue aspirin and clopidogrel overnight.  If no evidence of bleeding or vascular complication, would recommend switching to apixaban 5 mg twice daily plus  clopidogrel 75 mg daily for 12 months as soon as tomorrow. Aggressive secondary prevention of coronary artery disease. Optimize goal-directed medical therapy for chronic HFrEF, as blood pressure and renal function allow. Nelva Bush, MD [facility]  ECHOCARDIOGRAM COMPLETE  Result Date: 07/18/2022    ECHOCARDIOGRAM REPORT   Patient Name:   Shawn Jennings Date of Exam: 07/18/2022 Medical Rec #:  [id number]      Height:       72.0 in Accession #:    [id number]     Weight:       138.2 lb Date of Birth:  [birth year]      BSA:          1.821 m Patient Age:    33 years       BP:           131/67 mmHg Patient Gender: M              HR:           74 bpm. Exam Location:  [facility] Procedure: 2D Echo, Color Doppler, Cardiac Doppler and Intracardiac            Opacification Agent Indications:     I21.4 NSTEMI  History:         Patient has prior history of Echocardiogram examinations, most                  recent 08/03/2021. Ischemic Cardiomyopathy, CAD, Prior CABG,                  CKD, COPD and PAD; Risk Factors:Diabetes, Current Smoker,                  Hypertension and Dyslipidemia.                  Aortic Valve: bioprosthetic valve is present in the aortic                  position.  Sonographer:     Charmayne Sheer Referring Phys:  [id number] [place] Diagnosing Phys: Kate Sable MD IMPRESSIONS  1. Left ventricular ejection fraction, by estimation, is 20 to 25%. The left ventricle has severely decreased function. The left ventricle demonstrates global hypokinesis. There is mild left ventricular hypertrophy. Left ventricular diastolic parameters  are consistent with Grade II diastolic dysfunction (pseudonormalization).  2. Right ventricular systolic function is normal. The right ventricular size is not well visualized.  3. The mitral valve is degenerative. No evidence of mitral valve regurgitation.  4. The aortic valve has been repaired/replaced. Aortic valve regurgitation is not visualized. There is a bioprosthetic  valve present in the aortic position.  5. Aortic dilatation noted. There is mild dilatation of the ascending aorta, measuring 40 mm.  6. The inferior vena cava is normal in size with greater than 50% respiratory variability, suggesting right atrial pressure of 3 mmHg. FINDINGS  Left Ventricle: Left ventricular ejection fraction, by estimation, is 20 to 25%. The left ventricle has severely decreased function. The left ventricle demonstrates global hypokinesis. Definity contrast agent was given IV to delineate the left ventricular endocardial borders. The left ventricular internal cavity size was normal in size. There is mild left ventricular hypertrophy. Left ventricular diastolic parameters are consistent with Grade II diastolic dysfunction (pseudonormalization). Right Ventricle: The right ventricular size is not well visualized. No increase in right ventricular wall thickness. Right ventricular systolic function is normal. Left Atrium: Left atrial size was normal in size. Right Atrium: Right atrial size was normal in size. Pericardium: There is no evidence of pericardial effusion. Mitral Valve: The mitral valve is degenerative in appearance. No evidence of mitral valve regurgitation. Tricuspid Valve: The tricuspid valve is not well visualized. Tricuspid valve regurgitation is not demonstrated. Aortic Valve: The aortic valve has been repaired/replaced. Aortic valve regurgitation is not visualized. Aortic valve mean gradient measures 6.0  mmHg. Aortic valve peak gradient measures 10.2 mmHg. Aortic valve area, by VTI measures 1.84 cm. There is a bioprosthetic valve present in the aortic position. Pulmonic Valve: The pulmonic valve was not well visualized. Pulmonic valve regurgitation is not visualized. Aorta: Aortic dilatation noted. There is mild dilatation of the ascending aorta, measuring 40 mm. Venous: The inferior vena cava is normal in size with greater than 50% respiratory variability, suggesting right atrial  pressure of 3 mmHg. IAS/Shunts: No atrial level shunt detected by color flow Doppler.  LEFT VENTRICLE PLAX 2D LVIDd:         6.20 cm   Diastology LVIDs:         5.10 cm   LV e' medial:    3.59 cm/s LV PW:         1.10 cm   LV E/e' medial:  42.1 LV IVS:        1.30 cm   LV e' lateral:   6.42 cm/s LVOT diam:     1.90 cm   LV E/e' lateral: 23.5 LV SV:         51 LV SV Index:   28 LVOT Area:     2.84 cm  RIGHT VENTRICLE RV Basal diam:  3.20 cm LEFT ATRIUM           Index        RIGHT ATRIUM           Index LA diam:      3.90 cm 2.14 cm/m   RA Area:     12.00 cm LA Vol (A2C): 25.0 ml 13.73 ml/m  RA Volume:   26.70 ml  14.66 ml/m  AORTIC VALVE                     PULMONIC VALVE AV Area (Vmax):    1.91 cm      PV Vmax:       0.94 m/s AV Area (Vmean):   1.80 cm      PV Peak grad:  3.5 mmHg AV Area (VTI):     1.84 cm AV Vmax:           160.00 cm/s AV Vmean:          108.000 cm/s AV VTI:            0.280 m AV Peak Grad:      10.2 mmHg AV Mean Grad:      6.0 mmHg LVOT Vmax:         107.80 cm/s LVOT Vmean:        68.400 cm/s LVOT VTI:          0.182 m LVOT/AV VTI ratio: 0.65  AORTA Ao Root diam: 3.30 cm MITRAL VALVE MV Area (PHT): 5.90 cm     SHUNTS MV Decel Time: 129 msec     Systemic VTI:  0.18 m MV E velocity: 151.00 cm/s  Systemic Diam: 1.90 cm Kate Sable MD Electronically signed by Kate Sable MD Signature Date/Time: 07/18/2022/3:04:38 PM    Final    CT ABDOMEN PELVIS W CONTRAST  Result Date: 07/17/2022 CLINICAL DATA:  Epigastric pain EXAM: CT ABDOMEN AND PELVIS WITH CONTRAST TECHNIQUE: Multidetector CT imaging of the abdomen and pelvis was performed using the standard protocol following bolus administration of intravenous contrast. RADIATION DOSE REDUCTION: This exam was performed according to the departmental dose-optimization program which includes automated exposure control, adjustment of the mA and/or kV according to patient size and/or use of  iterative reconstruction technique. CONTRAST:   27m OMNIPAQUE IOHEXOL 350 MG/ML SOLN COMPARISON:  CT 07/15/2021, 10/17/2020 FINDINGS: Lower chest: Lung bases are degraded by motion. No consolidative process, see separately dictated chest CT. Aortic valve prosthesis. Hepatobiliary: No focal liver abnormality is seen. Status post cholecystectomy. No biliary dilatation. Pancreas: Unremarkable. No pancreatic ductal dilatation or surrounding inflammatory changes. Spleen: Normal in size without focal abnormality. Adrenals/Urinary Tract: Adrenal glands are within normal limits. Kidneys show no hydronephrosis. Subcentimeter hypodense renal lesions too small to further characterize. Partially exophytic upper pole lesion on the right measuring 1.9 cm, contains slightly dense complex area, series 4, image 26. Mild wall thickening along the right bladder wall with slightly asymmetric mucosal enhancement, series 4, image 75. Stomach/Bowel: The stomach is nonenlarged. No dilated small bowel. No acute bowel wall thickening. Postsurgical changes in the left upper quadrant with chronically dilated segment of bowel in the region but no obstruction. Negative appendix Vascular/Lymphatic: Advanced aortic atherosclerosis. No aneurysm. Chronically occluded left SFA stent incompletely visualized. No suspicious lymph nodes. Reproductive: Prostate is unremarkable. Other: Negative for pelvic effusion or free air. Musculoskeletal: No acute osseous abnormality. IMPRESSION: 1. No CT evidence for acute intra-abdominal or pelvic abnormality. 2. 19 mm partially exophytic complex cystic lesion upper pole right kidney. When the patient is clinically stable and able to follow directions and hold their breath (preferably as an outpatient) further evaluation with dedicated abdominal MRI should be considered. 3. Mild wall thickening with slight asymmetric mucosal enhancement at the right posterior bladder wall, indeterminate for an area of infection or inflammation. 4. Other chronic findings as  discussed above Electronically Signed   By: KDonavan FoilM.D.   On: 07/17/2022 23:16   CT Angio Chest PE W and/or Wo Contrast  Result Date: 07/17/2022 CLINICAL DATA:  Positive D-dimer with epigastric pain. EXAM: CT ANGIOGRAPHY CHEST WITH CONTRAST TECHNIQUE: Multidetector CT imaging of the chest was performed using the standard protocol during bolus administration of intravenous contrast. Multiplanar CT image reconstructions and MIPs were obtained to evaluate the vascular anatomy. RADIATION DOSE REDUCTION: This exam was performed according to the departmental dose-optimization program which includes automated exposure control, adjustment of the mA and/or kV according to patient size and/or use of iterative reconstruction technique. CONTRAST:  717mOMNIPAQUE IOHEXOL 350 MG/ML SOLN COMPARISON:  CT angiogram chest 08/03/2021 FINDINGS: Cardiovascular: There is mild aneurysmal dilatation of the ascending aorta measuring 4.1 cm which is unchanged. There are atherosclerotic calcifications of the aorta. Aortic valve replacement is present. The heart is mildly enlarged. There is no pericardial effusion. There are atherosclerotic calcifications of the aorta and coronary arteries. There is adequate opacification of the pulmonary arteries to the segmental level. No pulmonary embolism identified. Mediastinum/Nodes: No enlarged mediastinal, hilar, or axillary lymph nodes. Thyroid gland, trachea, and esophagus demonstrate no significant findings. Lungs/Pleura: There are minimal patchy airspace opacities in the right lower lobe. There is right lower lobe peribronchial wall thickening and plugging of bronchi similar to prior study. Right pleural effusion has resolved in the interval. There is minimal left lower lobe atelectasis. There is no evidence for pneumothorax. Upper Abdomen: Cholecystectomy clips are present. Musculoskeletal: No chest wall abnormality. No acute or significant osseous findings. Sternotomy wires are  present. Review of the MIP images confirms the above findings. IMPRESSION: 1. No evidence for pulmonary embolism. 2. 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 Recommend annual imaging followup by CTA or MRA. This recommendation follows 2010 ACCF/AHA/AATS/ACR/ASA/SCA/SCAI/SIR/STS/SVM Guidelines for the Diagnosis and Management of Patients  with Thoracic Aortic Disease. Circulation. 2010; 121: T740-Z927. Aortic aneurysm NOS (ICD10-I71.9) 3. Right lower lobe peribronchial wall thickening, plugging of bronchi and minimal airspace disease compatible with infectious/inflammatory process. Correlate clinically for aspiration. 4. Mild cardiomegaly. 5.  Aortic Atherosclerosis (ICD10-I70.0). Electronically Signed   By: Ronney Asters M.D.   On: 07/17/2022 23:13   DG Chest Port 1 View  Result Date: 07/17/2022 CLINICAL DATA:  Chest pain EXAM: PORTABLE CHEST 1 VIEW COMPARISON:  11/08/2021 FINDINGS: Prior CABG. Heart and mediastinal contours are within normal limits. No focal opacities or effusions. No acute bony abnormality. IMPRESSION: No active cardiopulmonary disease. Electronically Signed   By: Rolm Baptise M.D.   On: 07/17/2022 22:14     Assessment/Plan There are no diagnoses linked to this encounter.   Hortencia Pilar, MD  08/08/2022 2:41 PM

## 2022-08-10 ENCOUNTER — Other Ambulatory Visit (INDEPENDENT_AMBULATORY_CARE_PROVIDER_SITE_OTHER): Payer: Self-pay | Admitting: Vascular Surgery

## 2022-08-10 DIAGNOSIS — I739 Peripheral vascular disease, unspecified: Secondary | ICD-10-CM

## 2022-08-13 ENCOUNTER — Encounter (INDEPENDENT_AMBULATORY_CARE_PROVIDER_SITE_OTHER): Payer: Medicare Other

## 2022-08-13 ENCOUNTER — Encounter (INDEPENDENT_AMBULATORY_CARE_PROVIDER_SITE_OTHER): Payer: Medicare Other | Admitting: Vascular Surgery

## 2022-08-13 DIAGNOSIS — I25118 Atherosclerotic heart disease of native coronary artery with other forms of angina pectoris: Secondary | ICD-10-CM

## 2022-08-13 DIAGNOSIS — E782 Mixed hyperlipidemia: Secondary | ICD-10-CM

## 2022-08-13 DIAGNOSIS — I70213 Atherosclerosis of native arteries of extremities with intermittent claudication, bilateral legs: Secondary | ICD-10-CM

## 2022-08-13 DIAGNOSIS — J449 Chronic obstructive pulmonary disease, unspecified: Secondary | ICD-10-CM

## 2022-08-13 DIAGNOSIS — I10 Essential (primary) hypertension: Secondary | ICD-10-CM

## 2022-08-13 DIAGNOSIS — N1831 Chronic kidney disease, stage 3a: Secondary | ICD-10-CM

## 2022-08-16 ENCOUNTER — Ambulatory Visit: Payer: Medicare Other | Admitting: Gastroenterology

## 2022-08-30 ENCOUNTER — Ambulatory Visit (INDEPENDENT_AMBULATORY_CARE_PROVIDER_SITE_OTHER): Payer: Medicare Other | Admitting: Podiatry

## 2022-08-30 ENCOUNTER — Encounter: Payer: Self-pay | Admitting: Podiatry

## 2022-08-30 DIAGNOSIS — M79675 Pain in left toe(s): Secondary | ICD-10-CM

## 2022-08-30 DIAGNOSIS — M79674 Pain in right toe(s): Secondary | ICD-10-CM

## 2022-08-30 DIAGNOSIS — I739 Peripheral vascular disease, unspecified: Secondary | ICD-10-CM | POA: Diagnosis not present

## 2022-08-30 DIAGNOSIS — B351 Tinea unguium: Secondary | ICD-10-CM

## 2022-08-30 NOTE — Progress Notes (Signed)
This patient returns to my office for at risk foot care.  This patient requires this care by a professional since this patient will be at risk due to having  PAD and amputation left leg.  This patient is unable to cut nails himself since the patient cannot reach his nails.These nails are painful walking and wearing shoes. Patient presents to the office in a wheelchair.   This patient presents for at risk foot care today.  General Appearance  Alert, conversant and in no acute stress.  Vascular  Dorsalis pedis and posterior tibial  pulses are  weakly/not palpable  bilaterally.  Capillary return is within normal limits  bilaterally. Cold feet  bilaterally.  Neurologic  Senn-Weinstein monofilament wire test within normal limits  bilaterally. Muscle power within normal limits bilaterally.  Nails Thick disfigured discolored nails with subungual debris  from hallux to fifth toes right.. No evidence of bacterial infection or drainage bilaterally.  Orthopedic  No limitations of motion  feet .  No crepitus or effusions noted.  No bony pathology or digital deformities noted.  Skin  normotropic skin with no porokeratosis noted bilaterally.  No signs of infections or ulcers noted.     Onychomycosis  Pain in right toes  Pain in left toes  Consent was obtained for treatment procedures.   Mechanical debridement of nails 1-5  bilaterally performed with a nail nipper.  Filed with dremel without incident.    Return office visit   3 months                   Told patient to return for periodic foot care and evaluation due to potential at risk complications.   Nana Vastine DPM   

## 2022-09-27 ENCOUNTER — Ambulatory Visit: Payer: Medicare Other | Admitting: Gastroenterology

## 2022-09-27 NOTE — Progress Notes (Deleted)
Primary Care Physician: Pcp, No  Primary Gastroenterologist:  Dr. [name]  No chief complaint on file.   HPI: Shawn Jennings is a 76 y.o. male here for follow-up.  The patient was originally seen by Dr. Luanna Cole and then I was brought in to do an ERCP.  The patient had an ERCP with a stent placement.  He then subsequently had a repeat ERCP with the stent removal.  Subsequent to that the patient was admitted with a GI bleed for which she was seen by me and Dr. Marius Ditch.  The patient is now here for a follow-up.  The patient's hospital course was complicated by him having a NSTEMI.  Past Medical History:  Diagnosis Date   Acquired dilation of ascending aorta and aortic root ([place])    a. 06/2022 Echo: Asc Ao 44m.   Aortic stenosis    a. Pt unaware of history but CT imaging 01/2019 consistent w/ AVR; b. 01/2019 Echo: Mild to mod AS. Mean grad 185mg; c. 06/2022 Echo: Nl fxn'ing prosth AoV. Asc Ao 4045m  CAD (coronary artery disease)    [place]. [place] 05[place] Cath: Sev 3VD, patent grafts->Med Rx; c. 06/2022 NSTEMI/PCI: LM diff dzs, LAD 100m27m 40, LCX 100p/m, OM3 mod dzs, RCA 100p, LIMA->LAD ok, RIMA->RPDA 40ost, VG->D2 30p/95p (3.0x22 Onyx Frontier DES)--< L Radial-OM3 20ost.   Chronic HFrEF (heart failure with reduced ejection fraction) (HCC)[place] a. 01/2019 Echo: EF 40-45%; b. 04/2020 Echo: EF 25-30%; c. 09/2020 Echo: EF 50-55%; d. 07/2021 Echo: EF 20-25%; e. 06/2022 Echo: EF 20-25%, glob HK, mild LVH, GrII DD, nl RV fxn, nl fxn'ing bioprosth AoV, Asc Ao 40mm52mCKD (chronic kidney disease), stage III (HCC)    COPD (chronic obstructive pulmonary disease) (HCC) [place]    Essential hypertension    GERD (gastroesophageal reflux disease)    Hyperlipidemia LDL goal <70    Ischemic cardiomyopathy    a. 01/2019 Echo: EF 40-45%; b. 04/2020 Echo: EF 25-30%; c. 09/2020 Echo: EF 50-55%; d. 07/2021 Echo: EF 20-25%; e. 06/2022 Echo: EF 20-25%.   PAD (peripheral artery disease) (HCC) [place] a. 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   PAF (paroxysmal atrial fibrillation) (HCC)    a. CHA2DS2VASc = 4-->Xarelto '15mg'$  daily in setting of CKD.   Thyrotoxicosis    Tobacco abuse    Type II diabetes mellitus (HCC)     Current Outpatient Medications  Medication Sig Dispense Refill   ACETAMINOPHEN EXTRA STRENGTH 500 MG capsule Take 1 capsule by mouth every 6 (six) hours as needed.     albuterol (VENTOLIN HFA) 108 (90 Base) MCG/ACT inhaler Inhale 2 puffs into the lungs every 6 (six) hours as needed for wheezing or shortness of breath.      apixaban (ELIQUIS) 5 MG TABS tablet Take 1 tablet (5 mg total) by mouth 2 (two) times daily. 60 tablet 0   atorvastatin (LIPITOR) 80 MG tablet Take 80 mg by mouth at bedtime.     BIOFREEZE COOL THE PAIN 4 % GEL Apply 1 Application topically 3 (three) times daily as needed. Apply to right shoulder three times daily as needed for pain     carvedilol (COREG) 3.125 MG tablet Take 1 tablet (3.125 mg total) by mouth 2 (two) times daily. 60 tablet 3   D 1000 25 MCG (1000 UT) capsule Take 1 capsule by mouth daily.     dapagliflozin propanediol (FARXIGA) 10 MG TABS tablet  Take 1 tablet (10 mg total) by mouth daily. 30 tablet 5   DULoxetine (CYMBALTA) 30 MG capsule Take 30 mg by mouth 2 (two) times daily.     ferrous sulfate 325 (65 FE) MG tablet Take 325 mg by mouth every other day.     furosemide (LASIX) 20 MG tablet Take 1 tablet (20 mg total) by mouth daily. 30 tablet 0   hydrOXYzine (ATARAX/VISTARIL) 25 MG tablet Take 25 mg by mouth every 6 (six) hours as needed (allergy symptoms).     isosorbide mononitrate (IMDUR) 30 MG 24 hr tablet Take 1 tablet (30 mg total) by mouth daily. 30 tablet 0   lamoTRIgine (LAMICTAL) 25 MG tablet Take 25 mg by mouth 2 (two) times daily.     loratadine (CLARITIN) 10 MG tablet Take 10 mg by mouth daily.     Melatonin 3 MG TABS Take 5 mg by mouth at bedtime.     montelukast (SINGULAIR) 10 MG tablet Take 10 mg by mouth at bedtime.     Multiple  Vitamins-Minerals (PRESERVISION AREDS) CAPS Take 1 capsule by mouth in the morning and at bedtime.     nitroGLYCERIN (NITROSTAT) 0.4 MG SL tablet Place 0.4 mg under the tongue every 5 (five) minutes as needed for chest pain.     pantoprazole (PROTONIX) 40 MG tablet Take 40 mg by mouth daily.     sacubitril-valsartan (ENTRESTO) 24-26 MG Take 1 tablet by mouth 2 (two) times daily.     spironolactone (ALDACTONE) 25 MG tablet Take 1 tablet (25 mg total) by mouth daily. 90 tablet 3   vitamin B-12 (CYANOCOBALAMIN) 1000 MCG tablet Take 1,000 mcg by mouth daily.     No current facility-administered medications for this visit.    Allergies as of 09/27/2022   (No Known Allergies)    ROS:  General: Negative for anorexia, weight loss, fever, chills, fatigue, weakness. ENT: Negative for hoarseness, difficulty swallowing , nasal congestion. CV: Negative for chest pain, angina, palpitations, dyspnea on exertion, peripheral edema.  Respiratory: Negative for dyspnea at rest, dyspnea on exertion, cough, sputum, wheezing.  GI: See history of present illness. GU:  Negative for dysuria, hematuria, urinary incontinence, urinary frequency, nocturnal urination.  Endo: Negative for unusual weight change.    Physical Examination:   There were no vitals taken for this visit.  General: Well-nourished, well-developed in no acute distress.  Eyes: No icterus. Conjunctivae pink. Lungs: Clear to auscultation bilaterally. Non-labored. Heart: Regular rate and rhythm, no murmurs rubs or gallops.  Abdomen: Bowel sounds are normal, nontender, nondistended, no hepatosplenomegaly or masses, no abdominal bruits or hernia , no rebound or guarding.   Extremities: No lower extremity edema. No clubbing or deformities. Neuro: Alert and oriented x 3.  Grossly intact. Skin: Warm and dry, no jaundice.   Psych: Alert and cooperative, normal mood and affect.  Labs:    Imaging Studies: No results found.  Assessment and Plan:    Shawn Jennings is a 76 y.o. y/o male ***     [name], MD. Marval Regal    Note: This dictation was prepared with Dragon dictation along with smaller phrase technology. Any transcriptional errors that result from this process are unintentional.

## 2022-10-25 ENCOUNTER — Non-Acute Institutional Stay: Payer: Medicare Other | Admitting: Hospice

## 2022-10-25 DIAGNOSIS — Z515 Encounter for palliative care: Secondary | ICD-10-CM

## 2022-10-25 DIAGNOSIS — Z716 Tobacco abuse counseling: Secondary | ICD-10-CM

## 2022-10-25 DIAGNOSIS — J449 Chronic obstructive pulmonary disease, unspecified: Secondary | ICD-10-CM

## 2022-10-25 DIAGNOSIS — G4700 Insomnia, unspecified: Secondary | ICD-10-CM

## 2022-10-25 DIAGNOSIS — I5042 Chronic combined systolic (congestive) and diastolic (congestive) heart failure: Secondary | ICD-10-CM

## 2022-10-25 NOTE — Progress Notes (Signed)
    [name] Consult Note Telephone: [phone]  Fax: [phone]    Date of encounter: 10/25/22 12:30PM PATIENT NAME: Shawn Jennings [id number]   [phone] (home)  DOB: [birth year] MRN: [id number] PRIMARY CARE PROVIDER:    Estell Harpin, NP  REFERRING PROVIDER:   Estell Harpin, NP  RESPONSIBLE PARTY:    Contact Information     Name Relation Home Work [place] Other   [phone]        I met face to face with patient in the facility. Palliative Care was asked to follow this patient by consultation request of  Eventus Su Monks, NP to address advance care planning and complex medical decision making. This is a follow up visit.                                   ASSESSMENT AND PLAN / RECOMMENDATIONS:   Advance Care Planning/Goals of Care: Goals include to maximize quality of life and symptom management.   CODE STATUS: Full Code  Symptom Management/Plan:  Chronic systolic and diastolic heart failure-in no fluid overload.  Continue carvedilol, Farxiga, entresto, furosemide and spironolactone as directed. Follow up with HF clinic as scheduled.  COPD: No exacerbation. Uses oxygen supplementation at night 2L/Min. Continue breathing treatments as ordered. Avoid triggers/smoking.  Insomnia: Managed with melatonin.  Sleep hygiene encouraged. Smoking cessation-Education reiterated on smoking cessation. He is down to 6 cigarettes a day.   Follow up Palliative Care Visit: Palliative care will continue to follow for complex medical decision making, advance care planning, and clarification of goals. Return in 8 weeks or prn.  This visit was coded based on medical decision making (MDM).  PPS: 50%  HOSPICE ELIGIBILITY/DIAGNOSIS: TBD  Chief Complaint: Palliative Medicine follow up visit.   HISTORY OF PRESENT ILLNESS:  Shawn Jennings  is a 77 y.o. year old male  with heart failure with reduced EF, hypertension, ischemic cardiomyopathy, atrial fibrillation, CKD, hx of COPD, aspiration pneumonia, T2DM, insomnia, hx of GI bleed. Patient hospitalized 9/26-9/28/23 due to NSTEMI- s/p cardiac cath, LAKA. History obtained from review of EMR, discussion with primary team, and interview with family, facility staff/caregiver and/or Mr. Slutsky.  A 10 point ROS asked and negative. I reviewed available labs, medications, imaging, studies and related documents from the EMR.  Records reviewed and summarized above.   I spent 45 minutes providing this consultation; this includes time spent with patient/family, chart review and documentation. More than 50% of the time in this consultation was spent on counseling and coordinating communication. Thank you for the opportunity to participate in the care of Shawn Jennings.  The palliative care team will continue to follow. Please call our office at [phone] if we can be of additional assistance.   Teodoro Spray, NP

## 2022-10-31 ENCOUNTER — Emergency Department: Payer: Medicare Other

## 2022-10-31 ENCOUNTER — Encounter: Payer: Self-pay | Admitting: *Deleted

## 2022-10-31 ENCOUNTER — Inpatient Hospital Stay
Admission: EM | Admit: 2022-10-31 | Discharge: 2022-11-14 | DRG: 377 | Disposition: A | Discharge status: Nursing Home (Includes ICF) | Payer: Medicare Other | Source: Skilled Nursing Facility | Attending: Family Medicine | Admitting: Family Medicine

## 2022-10-31 ENCOUNTER — Other Ambulatory Visit: Payer: Self-pay

## 2022-10-31 DIAGNOSIS — Z951 Presence of aortocoronary bypass graft: Secondary | ICD-10-CM

## 2022-10-31 DIAGNOSIS — K254 Chronic or unspecified gastric ulcer with hemorrhage: Principal | ICD-10-CM | POA: Diagnosis present

## 2022-10-31 DIAGNOSIS — I2510 Atherosclerotic heart disease of native coronary artery without angina pectoris: Secondary | ICD-10-CM | POA: Diagnosis present

## 2022-10-31 DIAGNOSIS — J690 Pneumonitis due to inhalation of food and vomit: Secondary | ICD-10-CM

## 2022-10-31 DIAGNOSIS — I10 Essential (primary) hypertension: Secondary | ICD-10-CM | POA: Diagnosis present

## 2022-10-31 DIAGNOSIS — I25118 Atherosclerotic heart disease of native coronary artery with other forms of angina pectoris: Secondary | ICD-10-CM | POA: Diagnosis present

## 2022-10-31 DIAGNOSIS — K5900 Constipation, unspecified: Secondary | ICD-10-CM | POA: Diagnosis present

## 2022-10-31 DIAGNOSIS — Z7901 Long term (current) use of anticoagulants: Secondary | ICD-10-CM

## 2022-10-31 DIAGNOSIS — N1831 Chronic kidney disease, stage 3a: Secondary | ICD-10-CM | POA: Diagnosis present

## 2022-10-31 DIAGNOSIS — I130 Hypertensive heart and chronic kidney disease with heart failure and stage 1 through stage 4 chronic kidney disease, or unspecified chronic kidney disease: Secondary | ICD-10-CM | POA: Diagnosis present

## 2022-10-31 DIAGNOSIS — R1013 Epigastric pain: Secondary | ICD-10-CM

## 2022-10-31 DIAGNOSIS — F32A Depression, unspecified: Secondary | ICD-10-CM | POA: Diagnosis present

## 2022-10-31 DIAGNOSIS — D649 Anemia, unspecified: Secondary | ICD-10-CM | POA: Diagnosis present

## 2022-10-31 DIAGNOSIS — I472 Ventricular tachycardia, unspecified: Secondary | ICD-10-CM | POA: Diagnosis not present

## 2022-10-31 DIAGNOSIS — D62 Acute posthemorrhagic anemia: Secondary | ICD-10-CM | POA: Diagnosis present

## 2022-10-31 DIAGNOSIS — Z79899 Other long term (current) drug therapy: Secondary | ICD-10-CM

## 2022-10-31 DIAGNOSIS — Z993 Dependence on wheelchair: Secondary | ICD-10-CM

## 2022-10-31 DIAGNOSIS — K219 Gastro-esophageal reflux disease without esophagitis: Secondary | ICD-10-CM | POA: Diagnosis present

## 2022-10-31 DIAGNOSIS — Z955 Presence of coronary angioplasty implant and graft: Secondary | ICD-10-CM

## 2022-10-31 DIAGNOSIS — K922 Gastrointestinal hemorrhage, unspecified: Secondary | ICD-10-CM

## 2022-10-31 DIAGNOSIS — E785 Hyperlipidemia, unspecified: Secondary | ICD-10-CM | POA: Diagnosis present

## 2022-10-31 DIAGNOSIS — I480 Paroxysmal atrial fibrillation: Secondary | ICD-10-CM | POA: Diagnosis present

## 2022-10-31 DIAGNOSIS — I959 Hypotension, unspecified: Secondary | ICD-10-CM | POA: Diagnosis present

## 2022-10-31 DIAGNOSIS — I255 Ischemic cardiomyopathy: Secondary | ICD-10-CM | POA: Diagnosis present

## 2022-10-31 DIAGNOSIS — Z8249 Family history of ischemic heart disease and other diseases of the circulatory system: Secondary | ICD-10-CM

## 2022-10-31 DIAGNOSIS — I739 Peripheral vascular disease, unspecified: Secondary | ICD-10-CM | POA: Diagnosis present

## 2022-10-31 DIAGNOSIS — E1151 Type 2 diabetes mellitus with diabetic peripheral angiopathy without gangrene: Secondary | ICD-10-CM | POA: Diagnosis present

## 2022-10-31 DIAGNOSIS — E872 Acidosis, unspecified: Secondary | ICD-10-CM

## 2022-10-31 DIAGNOSIS — J449 Chronic obstructive pulmonary disease, unspecified: Secondary | ICD-10-CM | POA: Diagnosis present

## 2022-10-31 DIAGNOSIS — I252 Old myocardial infarction: Secondary | ICD-10-CM

## 2022-10-31 DIAGNOSIS — I5022 Chronic systolic (congestive) heart failure: Secondary | ICD-10-CM | POA: Diagnosis present

## 2022-10-31 DIAGNOSIS — Z89612 Acquired absence of left leg above knee: Secondary | ICD-10-CM

## 2022-10-31 DIAGNOSIS — F17210 Nicotine dependence, cigarettes, uncomplicated: Secondary | ICD-10-CM | POA: Diagnosis present

## 2022-10-31 DIAGNOSIS — E1122 Type 2 diabetes mellitus with diabetic chronic kidney disease: Secondary | ICD-10-CM | POA: Diagnosis present

## 2022-10-31 DIAGNOSIS — D500 Iron deficiency anemia secondary to blood loss (chronic): Principal | ICD-10-CM

## 2022-10-31 DIAGNOSIS — K259 Gastric ulcer, unspecified as acute or chronic, without hemorrhage or perforation: Secondary | ICD-10-CM | POA: Insufficient documentation

## 2022-10-31 DIAGNOSIS — D6832 Hemorrhagic disorder due to extrinsic circulating anticoagulants: Secondary | ICD-10-CM | POA: Diagnosis present

## 2022-10-31 DIAGNOSIS — R109 Unspecified abdominal pain: Secondary | ICD-10-CM | POA: Diagnosis present

## 2022-10-31 DIAGNOSIS — I5020 Unspecified systolic (congestive) heart failure: Secondary | ICD-10-CM | POA: Diagnosis present

## 2022-10-31 DIAGNOSIS — I70219 Atherosclerosis of native arteries of extremities with intermittent claudication, unspecified extremity: Secondary | ICD-10-CM | POA: Diagnosis present

## 2022-10-31 LAB — CBC
HCT: 23.3 % — ABNORMAL LOW (ref 39.0–52.0)
Hemoglobin: 6.3 g/dL — ABNORMAL LOW (ref 13.0–17.0)
MCH: 26.5 pg (ref 26.0–34.0)
MCHC: 27 g/dL — ABNORMAL LOW (ref 30.0–36.0)
MCV: 97.9 fL (ref 80.0–100.0)
Platelets: 411 10*3/uL — ABNORMAL HIGH (ref 150–400)
RBC: 2.38 MIL/uL — ABNORMAL LOW (ref 4.22–5.81)
RDW: 17.8 % — ABNORMAL HIGH (ref 11.5–15.5)
WBC: 15.1 10*3/uL — ABNORMAL HIGH (ref 4.0–10.5)
nRBC: 0 % (ref 0.0–0.2)

## 2022-10-31 LAB — BASIC METABOLIC PANEL
Anion gap: 9 (ref 5–15)
BUN: 28 mg/dL — ABNORMAL HIGH (ref 8–23)
CO2: 16 mmol/L — ABNORMAL LOW (ref 22–32)
Calcium: 8.2 mg/dL — ABNORMAL LOW (ref 8.9–10.3)
Chloride: 111 mmol/L (ref 98–111)
Creatinine, Ser: 1.56 mg/dL — ABNORMAL HIGH (ref 0.61–1.24)
GFR, Estimated: 45 mL/min — ABNORMAL LOW (ref >60)
Glucose, Bld: 129 mg/dL — ABNORMAL HIGH (ref 70–99)
Potassium: 4.5 mmol/L (ref 3.5–5.1)
Sodium: 136 mmol/L (ref 135–145)

## 2022-10-31 LAB — TROPONIN I (HIGH SENSITIVITY)
Troponin I (High Sensitivity): 9 ng/L (ref <18)
Troponin I (High Sensitivity): 11 ng/L (ref <18)

## 2022-10-31 LAB — HEPATIC FUNCTION PANEL
ALT: 9 U/L (ref 0–44)
AST: 15 U/L (ref 15–41)
Albumin: 3.1 g/dL — ABNORMAL LOW (ref 3.5–5.0)
Alkaline Phosphatase: 80 U/L (ref 38–126)
Bilirubin, Direct: <0.1 mg/dL (ref 0.0–0.2)
Total Bilirubin: 0.7 mg/dL (ref 0.3–1.2)
Total Protein: 6.5 g/dL (ref 6.5–8.1)

## 2022-10-31 LAB — RETICULOCYTES
Immature Retic Fract: 31.3 % — ABNORMAL HIGH (ref 2.3–15.9)
RBC.: 2.6 MIL/uL — ABNORMAL LOW (ref 4.22–5.81)
Retic Count, Absolute: 166.1 10*3/uL (ref 19.0–186.0)
Retic Ct Pct: 6.4 % — ABNORMAL HIGH (ref 0.4–3.1)

## 2022-10-31 LAB — LACTIC ACID, PLASMA: Lactic Acid, Venous: 1.8 mmol/L (ref 0.5–1.9)

## 2022-10-31 MED ORDER — SODIUM CHLORIDE 0.9 % IV SOLN
500.0000 mg | Freq: Once | INTRAVENOUS | Status: AC
  Administered 2022-10-31: 500 mg via INTRAVENOUS
  Filled 2022-10-31: qty 5

## 2022-10-31 MED ORDER — SODIUM CHLORIDE 0.9 % IV SOLN
1.0000 g | Freq: Once | INTRAVENOUS | Status: AC
  Administered 2022-10-31: 1 g via INTRAVENOUS
  Filled 2022-10-31: qty 10

## 2022-10-31 MED ORDER — PANTOPRAZOLE 80MG IVPB - SIMPLE MED
80.0000 mg | Freq: Once | INTRAVENOUS | Status: AC
  Administered 2022-10-31: 80 mg via INTRAVENOUS
  Filled 2022-10-31: qty 100

## 2022-10-31 MED ORDER — IOHEXOL 350 MG/ML SOLN
100.0000 mL | Freq: Once | INTRAVENOUS | Status: AC | PRN
  Administered 2022-10-31: 80 mL via INTRAVENOUS

## 2022-10-31 MED ORDER — SODIUM CHLORIDE 0.9% IV SOLUTION: Freq: Once | INTRAVENOUS | Status: AC

## 2022-10-31 MED ORDER — SODIUM CHLORIDE 0.9 % IV BOLUS
500.0000 mL | Freq: Once | INTRAVENOUS | Status: AC
  Administered 2022-10-31: 500 mL via INTRAVENOUS

## 2022-10-31 MED ORDER — HYDROMORPHONE HCL 1 MG/ML IJ SOLN
0.5000 mg | Freq: Once | INTRAMUSCULAR | Status: AC
  Administered 2022-10-31: 0.5 mg via INTRAVENOUS
  Filled 2022-10-31: qty 0.5

## 2022-10-31 NOTE — ED Provider Notes (Addendum)
[facility] Provider Note    Event Date/Time   First MD Initiated Contact with Patient 10/31/22 1914     (approximate)   History   Chest Pain   HPI  Shawn Jennings is a 77 y.o. male   Past medical history of CAD, heart failure, CKD, COPD, depression, hypertension, hyperlipidemia, peripheral artery disease, paroxysmal atrial fibrillation on Eliquis, diabetes, left leg amputation presents with epigastric pain for the past 4 days.  Denies nausea or vomiting, denies GI bleeding.  Some exertional shortness of breath and generalized weakness.  Of note he has had upper GI bleeding back in 2022.  History was obtained via the patient.  I reviewed external medical notes including a upper endoscopy from 2022 which showed gastropathy, no varices, no ulcer      Physical Exam   Triage Vital Signs: ED Triage Vitals  Enc Vitals Group     BP 10/31/22 1723 (!) 102/58     Pulse Rate 10/31/22 1723 76     Resp 10/31/22 1723 18     Temp 10/31/22 1723 97.8 F (36.6 C)     Temp Source 10/31/22 1723 Oral     SpO2 10/31/22 1723 95 %     Weight 10/31/22 1720 161 lb (73 kg)     Height 10/31/22 1720 6' (1.829 m)     Head Circumference --      Peak Flow --      Pain Score 10/31/22 1720 7     Pain Loc --      Pain Edu? --      Excl. in [place]? --     Most recent vital signs: Vitals:   10/31/22 2056 10/31/22 2110  BP: (!) 95/59 107/61  Pulse: 77 78  Resp: (!) 21 (!) 21  Temp: (!) 97.5 F (36.4 C)   SpO2:  99%    General: Awake, no distress.  CV:  Pale conjunctiva.  Blood pressure slightly low at 100 over 50s. Resp:  Normal effort.  Abd:  No distention.  Epigastric tenderness to palpation. Other:  His rectal exam shows some dark green stool, watery, guaiac positive.  No frank blood or melena.   ED Results / Procedures / Treatments   Labs (all labs ordered are listed, but only abnormal results are displayed) Labs Reviewed  BASIC METABOLIC PANEL - Abnormal;  Notable for the following components:      Result Value   CO2 16 (*)    Glucose, Bld 129 (*)    BUN 28 (*)    Creatinine, Ser 1.56 (*)    Calcium 8.2 (*)    GFR, Estimated 45 (*)    All other components within normal limits  CBC - Abnormal; Notable for the following components:   WBC 15.1 (*)    RBC 2.38 (*)    Hemoglobin 6.3 (*)    HCT 23.3 (*)    MCHC 27.0 (*)    RDW 17.8 (*)    Platelets 411 (*)    All other components within normal limits  HEPATIC FUNCTION PANEL - Abnormal; Notable for the following components:   Albumin 3.1 (*)    All other components within normal limits  LACTIC ACID, PLASMA  LIPASE, BLOOD  LACTIC ACID, PLASMA  TYPE AND SCREEN  PREPARE RBC (CROSSMATCH)  TROPONIN I (HIGH SENSITIVITY)  TROPONIN I (HIGH SENSITIVITY)     I reviewed labs and they are notable for he has a hemoglobin of 6 from 12 a few  months ago, normocytic, and a BUN of 28.  Creatinine at baseline at 1.5.  EKG  ED ECG REPORT I, Lucillie Garfinkel, the attending physician, personally viewed and interpreted this ECG.   Date: 10/31/2022  EKG Time: 1720  Rate: 76  Rhythm: Normal sinus rhythm  Axis: nl  Intervals:none  ST&T Change: No acute ischemic changes    RADIOLOGY I independently reviewed and interpreted chest x-ray and see no obvious signs of pneumothorax or focalities   PROCEDURES:  Critical Care performed: Yes, see critical care procedure note(s)  .Critical Care  Performed by: Lucillie Garfinkel, MD Authorized by: Lucillie Garfinkel, MD   Critical care provider statement:    Critical care time (minutes):  30   Critical care was necessary to treat or prevent imminent or life-threatening deterioration of the following conditions: Acute blood loss anemia requiring blood transfusion emergently.   Critical care was time spent personally by me on the following activities:  Development of treatment plan with patient or surrogate, discussions with consultants, evaluation of patient's response to  treatment, examination of patient, ordering and review of laboratory studies, ordering and review of radiographic studies, ordering and performing treatments and interventions, pulse oximetry, re-evaluation of patient's condition and review of old [place] ED: Medications  cefTRIAXone (ROCEPHIN) 1 g in sodium chloride 0.9 % 100 mL IVPB (has no administration in time range)  azithromycin (ZITHROMAX) 500 mg in sodium chloride 0.9 % 250 mL IVPB (has no administration in time range)  sodium chloride 0.9 % bolus 500 mL (has no administration in time range)  pantoprazole (PROTONIX) 80 mg /NS 100 mL IVPB (0 mg Intravenous Stopped 10/31/22 2106)  0.9 %  sodium chloride infusion (Manually program via Guardrails IV Fluids) (0 mLs Intravenous Paused 10/31/22 2106)  iohexol (OMNIPAQUE) 350 MG/ML injection 100 mL (80 mLs Intravenous Contrast Given 10/31/22 1948)  HYDROmorphone (DILAUDID) injection 0.5 mg (0.5 mg Intravenous Given 10/31/22 2030)    Consultants:  I spoke with hospitalist for admission and regarding care plan for this patient.   IMPRESSION / MDM / ASSESSMENT AND PLAN / ED COURSE  I reviewed the triage vital signs and the nursing notes.                              Differential diagnosis includes, but is not limited to, acute blood loss anemia, upper GI bleeding, ACS   The patient is on the cardiac monitor to evaluate for evidence of arrhythmia and/or significant heart rate changes.  MDM: Patient with suspicion for acute blood loss anemia upper GI bleeding with a history of the same epigastric tenderness to palpation with guaiac positive stools and acute drop in hemoglobin now down to 6 requiring blood transfusion.  Blood pressure slightly low 100/58 but normal heart rate.  He appears pale.  On Eliquis for atrial fibrillation paroxysmal, will hold for now.  Fortunately EKG is nonischemic and his initial troponin is negative I doubt this is cardiac in instead  epigastric pain.  Protonix given.  Admission.  CT angiogram for acute bleeding requiring IR intervention, if negative can admit to our [place] service.   CT angiogram with no active bleed.  There seems to be a aspiration pneumonia, he does complain of some chest pain, will antibiosis.  Admission.  Patient's presentation is most consistent with acute presentation with potential threat to life or bodily function.       FINAL CLINICAL  IMPRESSION(S) / ED DIAGNOSES   Final diagnoses:  Blood loss anemia  UGIB (upper gastrointestinal bleed)  Epigastric pain     Rx / DC Orders   ED Discharge Orders     None        Note:  This document was prepared using Dragon voice recognition software and may include unintentional dictation errors.    Lucillie Garfinkel, MD 10/31/22 2019    Lucillie Garfinkel, MD 10/31/22 2112

## 2022-10-31 NOTE — ED Notes (Addendum)
First Nurse Note: Patient to ED via [facility] from [facility] for chest pain. Patient states pain started at 1200 and was given 1 nitro at 1230. Currently patient has no complaints.  100% RA 110/30 79 HR 97.3 170 cbg

## 2022-10-31 NOTE — ED Triage Notes (Signed)
Pt brought in via ems from [facility] with chest pain.  Pt has pain in the center of chest for 1 hour.  Pt has sob.  Cig smoker.  No cough.  Pt alert

## 2022-11-01 ENCOUNTER — Encounter: Payer: Self-pay | Admitting: Internal Medicine

## 2022-11-01 DIAGNOSIS — F32A Depression, unspecified: Secondary | ICD-10-CM

## 2022-11-01 DIAGNOSIS — I480 Paroxysmal atrial fibrillation: Secondary | ICD-10-CM

## 2022-11-01 DIAGNOSIS — Z79899 Other long term (current) drug therapy: Secondary | ICD-10-CM | POA: Diagnosis not present

## 2022-11-01 DIAGNOSIS — D62 Acute posthemorrhagic anemia: Secondary | ICD-10-CM | POA: Diagnosis present

## 2022-11-01 DIAGNOSIS — E872 Acidosis, unspecified: Secondary | ICD-10-CM

## 2022-11-01 DIAGNOSIS — I25118 Atherosclerotic heart disease of native coronary artery with other forms of angina pectoris: Secondary | ICD-10-CM

## 2022-11-01 DIAGNOSIS — J690 Pneumonitis due to inhalation of food and vomit: Secondary | ICD-10-CM | POA: Diagnosis present

## 2022-11-01 DIAGNOSIS — K254 Chronic or unspecified gastric ulcer with hemorrhage: Secondary | ICD-10-CM | POA: Diagnosis present

## 2022-11-01 DIAGNOSIS — I5022 Chronic systolic (congestive) heart failure: Secondary | ICD-10-CM | POA: Diagnosis present

## 2022-11-01 DIAGNOSIS — D649 Anemia, unspecified: Secondary | ICD-10-CM | POA: Diagnosis not present

## 2022-11-01 DIAGNOSIS — N1831 Chronic kidney disease, stage 3a: Secondary | ICD-10-CM | POA: Diagnosis present

## 2022-11-01 DIAGNOSIS — I130 Hypertensive heart and chronic kidney disease with heart failure and stage 1 through stage 4 chronic kidney disease, or unspecified chronic kidney disease: Secondary | ICD-10-CM | POA: Diagnosis present

## 2022-11-01 DIAGNOSIS — E1151 Type 2 diabetes mellitus with diabetic peripheral angiopathy without gangrene: Secondary | ICD-10-CM | POA: Diagnosis present

## 2022-11-01 DIAGNOSIS — Z89612 Acquired absence of left leg above knee: Secondary | ICD-10-CM | POA: Diagnosis not present

## 2022-11-01 DIAGNOSIS — D508 Other iron deficiency anemias: Secondary | ICD-10-CM | POA: Diagnosis not present

## 2022-11-01 DIAGNOSIS — D6832 Hemorrhagic disorder due to extrinsic circulating anticoagulants: Secondary | ICD-10-CM | POA: Diagnosis present

## 2022-11-01 DIAGNOSIS — I70219 Atherosclerosis of native arteries of extremities with intermittent claudication, unspecified extremity: Secondary | ICD-10-CM | POA: Diagnosis not present

## 2022-11-01 DIAGNOSIS — R109 Unspecified abdominal pain: Secondary | ICD-10-CM | POA: Diagnosis not present

## 2022-11-01 DIAGNOSIS — R1013 Epigastric pain: Secondary | ICD-10-CM

## 2022-11-01 DIAGNOSIS — K219 Gastro-esophageal reflux disease without esophagitis: Secondary | ICD-10-CM | POA: Diagnosis present

## 2022-11-01 DIAGNOSIS — I5020 Unspecified systolic (congestive) heart failure: Secondary | ICD-10-CM

## 2022-11-01 DIAGNOSIS — I472 Ventricular tachycardia, unspecified: Secondary | ICD-10-CM | POA: Diagnosis not present

## 2022-11-01 DIAGNOSIS — D500 Iron deficiency anemia secondary to blood loss (chronic): Secondary | ICD-10-CM | POA: Diagnosis not present

## 2022-11-01 DIAGNOSIS — Z951 Presence of aortocoronary bypass graft: Secondary | ICD-10-CM | POA: Diagnosis not present

## 2022-11-01 DIAGNOSIS — Z7901 Long term (current) use of anticoagulants: Secondary | ICD-10-CM | POA: Diagnosis not present

## 2022-11-01 DIAGNOSIS — E1122 Type 2 diabetes mellitus with diabetic chronic kidney disease: Secondary | ICD-10-CM | POA: Diagnosis present

## 2022-11-01 DIAGNOSIS — I959 Hypotension, unspecified: Secondary | ICD-10-CM | POA: Diagnosis present

## 2022-11-01 DIAGNOSIS — I255 Ischemic cardiomyopathy: Secondary | ICD-10-CM

## 2022-11-01 DIAGNOSIS — F17210 Nicotine dependence, cigarettes, uncomplicated: Secondary | ICD-10-CM | POA: Diagnosis present

## 2022-11-01 DIAGNOSIS — D509 Iron deficiency anemia, unspecified: Secondary | ICD-10-CM | POA: Diagnosis not present

## 2022-11-01 DIAGNOSIS — J449 Chronic obstructive pulmonary disease, unspecified: Secondary | ICD-10-CM

## 2022-11-01 DIAGNOSIS — E785 Hyperlipidemia, unspecified: Secondary | ICD-10-CM | POA: Diagnosis present

## 2022-11-01 DIAGNOSIS — E782 Mixed hyperlipidemia: Secondary | ICD-10-CM

## 2022-11-01 DIAGNOSIS — I739 Peripheral vascular disease, unspecified: Secondary | ICD-10-CM | POA: Diagnosis present

## 2022-11-01 DIAGNOSIS — I10 Essential (primary) hypertension: Secondary | ICD-10-CM

## 2022-11-01 LAB — CBC
HCT: 23.2 % — ABNORMAL LOW (ref 39.0–52.0)
HCT: 21.8 % — ABNORMAL LOW (ref 39.0–52.0)
HCT: 21.8 % — ABNORMAL LOW (ref 39.0–52.0)
Hemoglobin: 6.8 g/dL — ABNORMAL LOW (ref 13.0–17.0)
Hemoglobin: 6.4 g/dL — ABNORMAL LOW (ref 13.0–17.0)
Hemoglobin: 6.5 g/dL — ABNORMAL LOW (ref 13.0–17.0)
MCH: 25.2 pg — ABNORMAL LOW (ref 26.0–34.0)
MCH: 25.3 pg — ABNORMAL LOW (ref 26.0–34.0)
MCH: 25.7 pg — ABNORMAL LOW (ref 26.0–34.0)
MCHC: 29.3 g/dL — ABNORMAL LOW (ref 30.0–36.0)
MCHC: 29.4 g/dL — ABNORMAL LOW (ref 30.0–36.0)
MCHC: 29.8 g/dL — ABNORMAL LOW (ref 30.0–36.0)
MCV: 85.9 fL (ref 80.0–100.0)
MCV: 86.2 fL (ref 80.0–100.0)
MCV: 86.2 fL (ref 80.0–100.0)
Platelets: 335 10*3/uL (ref 150–400)
Platelets: 332 10*3/uL (ref 150–400)
Platelets: 332 10*3/uL (ref 150–400)
RBC: 2.7 MIL/uL — ABNORMAL LOW (ref 4.22–5.81)
RBC: 2.53 MIL/uL — ABNORMAL LOW (ref 4.22–5.81)
RBC: 2.53 MIL/uL — ABNORMAL LOW (ref 4.22–5.81)
RDW: 22.2 % — ABNORMAL HIGH (ref 11.5–15.5)
RDW: 22.2 % — ABNORMAL HIGH (ref 11.5–15.5)
RDW: 22.4 % — ABNORMAL HIGH (ref 11.5–15.5)
WBC: 17.5 10*3/uL — ABNORMAL HIGH (ref 4.0–10.5)
WBC: 16.1 10*3/uL — ABNORMAL HIGH (ref 4.0–10.5)
WBC: 16.2 10*3/uL — ABNORMAL HIGH (ref 4.0–10.5)
nRBC: 0 % (ref 0.0–0.2)
nRBC: 0 % (ref 0.0–0.2)
nRBC: 0 % (ref 0.0–0.2)

## 2022-11-01 LAB — IRON AND TIBC
Iron: 59 ug/dL (ref 45–182)
Saturation Ratios: 20 % (ref 17.9–39.5)
TIBC: 302 ug/dL (ref 250–450)
UIBC: 243 ug/dL

## 2022-11-01 LAB — PREPARE RBC (CROSSMATCH)

## 2022-11-01 LAB — PROTIME-INR
INR: 1.5 — ABNORMAL HIGH (ref 0.8–1.2)
Prothrombin Time: 18.3 seconds — ABNORMAL HIGH (ref 11.4–15.2)

## 2022-11-01 LAB — BASIC METABOLIC PANEL
Anion gap: 6 (ref 5–15)
BUN: 26 mg/dL — ABNORMAL HIGH (ref 8–23)
CO2: 19 mmol/L — ABNORMAL LOW (ref 22–32)
Calcium: 7.8 mg/dL — ABNORMAL LOW (ref 8.9–10.3)
Chloride: 114 mmol/L — ABNORMAL HIGH (ref 98–111)
Creatinine, Ser: 1.51 mg/dL — ABNORMAL HIGH (ref 0.61–1.24)
GFR, Estimated: 47 mL/min — ABNORMAL LOW (ref >60)
Glucose, Bld: 123 mg/dL — ABNORMAL HIGH (ref 70–99)
Potassium: 4.1 mmol/L (ref 3.5–5.1)
Sodium: 139 mmol/L (ref 135–145)

## 2022-11-01 LAB — APTT: aPTT: 29 seconds (ref 24–36)

## 2022-11-01 LAB — HEMOGLOBIN AND HEMATOCRIT, BLOOD
HCT: 29.5 % — ABNORMAL LOW (ref 39.0–52.0)
Hemoglobin: 8.9 g/dL — ABNORMAL LOW (ref 13.0–17.0)

## 2022-11-01 LAB — PHOSPHORUS: Phosphorus: 3.4 mg/dL (ref 2.5–4.6)

## 2022-11-01 LAB — LIPASE, BLOOD: Lipase: 90 U/L — ABNORMAL HIGH (ref 11–51)

## 2022-11-01 LAB — VITAMIN B12: Vitamin B-12: 672 pg/mL (ref 180–914)

## 2022-11-01 LAB — FERRITIN: Ferritin: 17 ng/mL — ABNORMAL LOW (ref 24–336)

## 2022-11-01 LAB — FOLATE: Folate: 8.7 ng/mL (ref >5.9)

## 2022-11-01 LAB — LACTIC ACID, PLASMA: Lactic Acid, Venous: 0.8 mmol/L (ref 0.5–1.9)

## 2022-11-01 MED ORDER — LAMOTRIGINE 25 MG PO TABS
25.0000 mg | ORAL_TABLET | Freq: Two times a day (BID) | ORAL | Status: DC
  Administered 2022-11-01 – 2022-11-14 (×26): 25 mg via ORAL
  Filled 2022-11-01 (×28): qty 1

## 2022-11-01 MED ORDER — SODIUM CHLORIDE 0.9% FLUSH
3.0000 mL | Freq: Two times a day (BID) | INTRAVENOUS | Status: DC
  Administered 2022-11-01 – 2022-11-13 (×26): 3 mL via INTRAVENOUS

## 2022-11-01 MED ORDER — OXYCODONE HCL 5 MG PO TABS
5.0000 mg | ORAL_TABLET | ORAL | Status: DC | PRN
  Administered 2022-11-01 – 2022-11-10 (×3): 5 mg via ORAL
  Filled 2022-11-01 (×3): qty 1

## 2022-11-01 MED ORDER — SACUBITRIL-VALSARTAN 24-26 MG PO TABS
1.0000 | ORAL_TABLET | Freq: Two times a day (BID) | ORAL | Status: DC
  Filled 2022-11-01: qty 1

## 2022-11-01 MED ORDER — ISOSORBIDE MONONITRATE ER 60 MG PO TB24: 30.0000 mg | ORAL_TABLET | Freq: Every day | ORAL | Status: DC

## 2022-11-01 MED ORDER — ALBUTEROL SULFATE HFA 108 (90 BASE) MCG/ACT IN AERS: 2.0000 | INHALATION_SPRAY | Freq: Four times a day (QID) | RESPIRATORY_TRACT | Status: DC | PRN

## 2022-11-01 MED ORDER — SODIUM BICARBONATE 650 MG PO TABS
650.0000 mg | ORAL_TABLET | Freq: Three times a day (TID) | ORAL | Status: DC
  Administered 2022-11-01 – 2022-11-11 (×25): 650 mg via ORAL
  Filled 2022-11-01 (×28): qty 1

## 2022-11-01 MED ORDER — SPIRONOLACTONE 12.5 MG HALF TABLET
12.5000 mg | ORAL_TABLET | Freq: Every day | ORAL | Status: DC
  Administered 2022-11-02: 12.5 mg via ORAL
  Filled 2022-11-01 (×2): qty 1

## 2022-11-01 MED ORDER — PEG 3350-KCL-NA BICARB-NACL 420 G PO SOLR
2000.0000 mL | Freq: Once | ORAL | Status: DC
  Filled 2022-11-01: qty 4000

## 2022-11-01 MED ORDER — ATORVASTATIN CALCIUM 20 MG PO TABS
80.0000 mg | ORAL_TABLET | Freq: Every day | ORAL | Status: DC
  Administered 2022-11-01 – 2022-11-13 (×14): 80 mg via ORAL
  Filled 2022-11-01 (×14): qty 4

## 2022-11-01 MED ORDER — SODIUM CHLORIDE 0.9% IV SOLUTION
Freq: Once | INTRAVENOUS | Status: AC
  Filled 2022-11-01: qty 250

## 2022-11-01 MED ORDER — DULOXETINE HCL 30 MG PO CPEP
30.0000 mg | ORAL_CAPSULE | Freq: Two times a day (BID) | ORAL | Status: DC
  Administered 2022-11-01 – 2022-11-14 (×24): 30 mg via ORAL
  Filled 2022-11-01 (×24): qty 1

## 2022-11-01 MED ORDER — SODIUM CHLORIDE 0.9 % IV SOLN: 1.0000 g | INTRAVENOUS | Status: DC

## 2022-11-01 MED ORDER — SPIRONOLACTONE 25 MG PO TABS: 25.0000 mg | ORAL_TABLET | Freq: Every day | ORAL | Status: DC

## 2022-11-01 MED ORDER — NITROGLYCERIN 0.4 MG SL SUBL: 0.4000 mg | SUBLINGUAL_TABLET | SUBLINGUAL | Status: DC | PRN

## 2022-11-01 MED ORDER — ACETAMINOPHEN 325 MG PO TABS
650.0000 mg | ORAL_TABLET | Freq: Four times a day (QID) | ORAL | Status: DC | PRN
  Administered 2022-11-01 – 2022-11-02 (×2): 650 mg via ORAL
  Filled 2022-11-01 (×2): qty 2

## 2022-11-01 MED ORDER — SODIUM CHLORIDE 0.9 % IV SOLN: 500.0000 mg | INTRAVENOUS | Status: DC

## 2022-11-01 MED ORDER — LORATADINE 10 MG PO TABS
10.0000 mg | ORAL_TABLET | Freq: Every day | ORAL | Status: DC
  Administered 2022-11-02 – 2022-11-14 (×12): 10 mg via ORAL
  Filled 2022-11-01 (×13): qty 1

## 2022-11-01 MED ORDER — FERROUS SULFATE 325 (65 FE) MG PO TABS
325.0000 mg | ORAL_TABLET | ORAL | Status: DC
  Administered 2022-11-03 – 2022-11-11 (×5): 325 mg via ORAL
  Filled 2022-11-01 (×7): qty 1

## 2022-11-01 MED ORDER — DULOXETINE HCL 30 MG PO CPEP
30.0000 mg | ORAL_CAPSULE | Freq: Two times a day (BID) | ORAL | Status: DC
  Filled 2022-11-01: qty 1

## 2022-11-01 MED ORDER — ALBUTEROL SULFATE (2.5 MG/3ML) 0.083% IN NEBU
2.5000 mg | INHALATION_SOLUTION | Freq: Four times a day (QID) | RESPIRATORY_TRACT | Status: DC | PRN
  Administered 2022-11-01: 2.5 mg via RESPIRATORY_TRACT
  Filled 2022-11-01: qty 3

## 2022-11-01 MED ORDER — ISOSORBIDE MONONITRATE ER 30 MG PO TB24
15.0000 mg | ORAL_TABLET | Freq: Every day | ORAL | Status: DC
  Administered 2022-11-02: 15 mg via ORAL
  Filled 2022-11-01 (×2): qty 1

## 2022-11-01 MED ORDER — CARVEDILOL 3.125 MG PO TABS
3.1250 mg | ORAL_TABLET | Freq: Two times a day (BID) | ORAL | Status: DC
  Administered 2022-11-01 – 2022-11-02 (×3): 3.125 mg via ORAL
  Filled 2022-11-01 (×3): qty 1

## 2022-11-01 MED ORDER — MELATONIN 5 MG PO TABS
5.0000 mg | ORAL_TABLET | Freq: Every day | ORAL | Status: DC
  Administered 2022-11-01 – 2022-11-13 (×14): 5 mg via ORAL
  Filled 2022-11-01 (×14): qty 1

## 2022-11-01 MED ORDER — ALUM & MAG HYDROXIDE-SIMETH 200-200-20 MG/5ML PO SUSP
15.0000 mL | ORAL | Status: DC | PRN
  Administered 2022-11-02: 15 mL via ORAL
  Filled 2022-11-01: qty 30

## 2022-11-01 MED ORDER — HYDROXYZINE HCL 50 MG PO TABS
25.0000 mg | ORAL_TABLET | Freq: Four times a day (QID) | ORAL | Status: DC | PRN
  Administered 2022-11-01 – 2022-11-02 (×3): 25 mg via ORAL
  Filled 2022-11-01 (×3): qty 1

## 2022-11-01 MED ORDER — FUROSEMIDE 40 MG PO TABS: 20.0000 mg | ORAL_TABLET | Freq: Every day | ORAL | Status: DC

## 2022-11-01 MED ORDER — PANTOPRAZOLE 80MG IVPB - SIMPLE MED
8.0000 mg/h | INTRAVENOUS | Status: AC
  Administered 2022-11-01 – 2022-11-04 (×6): 8 mg/h via INTRAVENOUS
  Filled 2022-11-01 (×9): qty 100

## 2022-11-01 MED ORDER — VITAMIN B-12 1000 MCG PO TABS
1000.0000 ug | ORAL_TABLET | Freq: Every day | ORAL | Status: DC
  Administered 2022-11-02 – 2022-11-14 (×12): 1000 ug via ORAL
  Filled 2022-11-01 (×5): qty 1
  Filled 2022-11-01: qty 2
  Filled 2022-11-01 (×7): qty 1

## 2022-11-01 MED ORDER — VITAMIN D 25 MCG (1000 UNIT) PO TABS
1000.0000 [IU] | ORAL_TABLET | Freq: Every day | ORAL | Status: DC
  Administered 2022-11-02 – 2022-11-14 (×11): 1000 [IU] via ORAL
  Filled 2022-11-01 (×14): qty 1

## 2022-11-01 MED ORDER — MONTELUKAST SODIUM 10 MG PO TABS
10.0000 mg | ORAL_TABLET | Freq: Every day | ORAL | Status: DC
  Administered 2022-11-01 – 2022-11-13 (×14): 10 mg via ORAL
  Filled 2022-11-01 (×14): qty 1

## 2022-11-01 NOTE — Progress Notes (Signed)
PROGRESS NOTE    Shawn Jennings   OXB:[id number] DOB: 06-Sep-1946  DOA: 10/31/2022 Date of Service: 11/01/22 PCP: Merryl Hacker, No     Brief Narrative / Hospital Course:  Shawn Jennings is a 77 y.o. 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  Patient reports that since morning 10/31/21. mild epigastric pain that is "annoying" no radiation no aggravating or relieving factors.  No associated diarrhea or vomiting or fevers.  01/10: ED course - Hgb 6.3, received 1 unit PRBC in ED. Retic ct high at 6.4. CTA GI bleed No appreciable active GI bleeding noted.  01/11: Hgb still low at 6.8, ordered another unit blood. GI to scope after Eliquis washout in next few days as patient cannot confirm most recent dose   Consultants:  Gastroenterology   Procedures: None at this time       ASSESSMENT & PLAN:   Principal Problem:   Anemia Active Problems:   Essential hypertension   HFrEF (heart failure with reduced ejection fraction) (HCC)   Stage 3a chronic kidney disease (CKD) ([place])   Coronary artery disease of native artery of native heart with stable angina pectoris (HCC)   Ischemic cardiomyopathy   PAF (paroxysmal atrial fibrillation) (HCC)   On anticoagulant therapy   Depression   GERD (gastroesophageal reflux disease)   Hyperlipidemia   Abdominal pain   Acute blood loss anemia   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)   Chronic obstructive pulmonary disease, unspecified (HCC)   Atherosclerosis 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 (HCC)   Metabolic acidosis   Dyspepsia   Anemia likely due to GI bleed (acute blood loss anemia), on anticoagulation due to PAF Acute blood loss anemia Abdominal pain History of GI bleed GERD (gastroesophageal reflux  disease) Dyspepsia Holding anticoagulation PPI in ED, starting drip  GI to scope after Eliquis washout in next few days as patient cannot confirm most recent dose  H Pylori Ag ordered Anemia workup started by admitting physician, will follow results and engage w/ hematology as needed pending workup but seems likely d/t GI bleed so I do not think he needs Hem/Onc consult at this time   Essential hypertension HFrEF (heart failure with reduced ejection fraction) ([place]) Coronary artery disease of native artery of native heart with stable angina pectoris (HCC) Ischemic cardiomyopathy PAF (paroxysmal atrial fibrillation) (HCC) On anticoagulant therapy Hyperlipidemia Atherosclerosis 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 (HCC) BP soft on this admission, concerning for hypovolemia d/t anemia  Adjusted home medications: held Eliquis, lowered doses of spironolactone, isosorbide; continue current low-dose beta-blocker for rate control; held Entresto, held Lasix  Stage 3a chronic kidney disease (CKD) ([place]) Metabolic acidosis Admitting physician started on bicarb Recheck BMP I do not think he needs nephrology consult at this time  Other chronic medical conditions, stable: Depression -continue home lamotrigine, duloxetine 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[place]) -consider PT/OT when able Chronic obstructive pulmonary disease, unspecified ([place]) -continue   DVT prophylaxis: none at this time Pertinent IV fluids/nutrition: no continuous IV fluids Central lines / invasive devices: none  Code Status: FULL CODE   Current Admission Status: observation  TOC needs / Dispo plan: none at this time, anticipate dc back to previous home environment +/- HH Barriers to discharge / significant pending items: GI eval for bleeding  Subjective / Brief ROS:  Patient reports feeling tired, feels overall about the same as when he came into the ED Denies  CP/SOB.  Pain controlled.  Denies new weakness.  Reports no concerns w/ urination/defecation.   Family Communication: pt declined call to family/support person(s)    Objective Findings:  Vitals:   11/01/22 1245 11/01/22 1300 11/01/22 1315 11/01/22 1330  BP: 133/64 127/60 121/63 114/65  Pulse: 68 65 67 66  Resp: (!) 21 (!) 21 20 (!) 22  Temp:      TempSrc:      SpO2: 99% 100% 100% 100%  Weight:      Height:        Intake/Output Summary (Last 24 hours) at 11/01/2022 1526 Last data filed at 11/01/2022 1123 Gross per 24 hour  Intake 1510 ml  Output --  Net 1510 ml   Filed Weights   10/31/22 1720  Weight: 73 kg    Examination:  Physical Exam Constitutional:      General: He is not in acute distress.    Appearance: He is ill-appearing.  Cardiovascular:     Rate and Rhythm: Normal rate and regular rhythm.     Heart sounds: Murmur heard.     Systolic murmur is present.  Pulmonary:     Effort: Pulmonary effort is normal. No respiratory distress.     Breath sounds: Normal breath sounds.  Abdominal:     Palpations: Abdomen is soft.  Musculoskeletal:     Right lower leg: No edema.  Skin:    General: Skin is warm and dry.  Neurological:     General: No focal deficit present.     Mental Status: He is alert.  Psychiatric:        Mood and Affect: Mood normal.        Behavior: Behavior normal.          Scheduled Medications:   atorvastatin  80 mg Oral QHS   carvedilol  3.125 mg Oral BID   cholecalciferol  1,000 Units Oral Daily   cyanocobalamin  1,000 mcg Oral Daily   DULoxetine  30 mg Oral BID   ferrous sulfate  325 mg Oral QODAY   isosorbide mononitrate  15 mg Oral Daily   lamoTRIgine  25 mg Oral BID   loratadine  10 mg Oral Daily   melatonin  5 mg Oral QHS   montelukast  10 mg Oral QHS   polyethylene glycol-electrolytes  2,000 mL Oral Once   sodium bicarbonate  650 mg Oral TID   sodium chloride flush  3 mL Intravenous Q12H   spironolactone  12.5 mg  Oral Daily    Continuous Infusions:  pantoprazole 8 mg/hr (11/01/22 0856)    PRN Medications:  acetaminophen, albuterol, alum & mag hydroxide-simeth, hydrOXYzine, nitroGLYCERIN, oxyCODONE  Antimicrobials from admission:  Anti-infectives (From admission, onward)    Start     Dose/Rate Route Frequency Ordered Stop   11/01/22 2300  azithromycin (ZITHROMAX) 500 mg in sodium chloride 0.9 % 250 mL IVPB  Status:  Discontinued        500 mg 250 mL/hr over 60 Minutes Intravenous Every 24 hours 11/01/22 0107 11/01/22 0748   11/01/22 2200  cefTRIAXone (ROCEPHIN) 1 g in sodium chloride 0.9 % 100 mL IVPB  Status:  Discontinued        1 g 200 mL/hr over 30 Minutes Intravenous Every 24 hours 11/01/22 0107 11/01/22 0748   11/01/22 0100  cefTRIAXone (ROCEPHIN) 1 g in sodium chloride 0.9 %  100 mL IVPB  Status:  Discontinued        1 g 200 mL/hr over 30 Minutes Intravenous Every 24 hours 11/01/22 0056 11/01/22 0107   11/01/22 0100  azithromycin (ZITHROMAX) 500 mg in sodium chloride 0.9 % 250 mL IVPB  Status:  Discontinued        500 mg 250 mL/hr over 60 Minutes Intravenous Every 24 hours 11/01/22 0056 11/01/22 0107   10/31/22 2115  cefTRIAXone (ROCEPHIN) 1 g in sodium chloride 0.9 % 100 mL IVPB        1 g 200 mL/hr over 30 Minutes Intravenous  Once 10/31/22 2111 10/31/22 2333   10/31/22 2115  azithromycin (ZITHROMAX) 500 mg in sodium chloride 0.9 % 250 mL IVPB        500 mg 250 mL/hr over 60 Minutes Intravenous  Once 10/31/22 2111 11/01/22 0055           Data Reviewed:  I have personally reviewed the following...  CBC: Recent Labs  Lab 10/31/22 1722 10/31/22 2355 11/01/22 0450 11/01/22 0750  WBC 15.1* 16.2* 17.5* 16.1*  HGB 6.3* 6.5* 6.8* 6.4*  HCT 23.3* 21.8* 23.2* 21.8*  MCV 97.9 86.2 85.9 86.2  PLT 411* 332 335 259   Basic Metabolic Panel: Recent Labs  Lab 10/31/22 1722 11/01/22 0450 11/01/22 0750  NA 136  --  139  K 4.5  --  4.1  CL 111  --  114*  CO2 16*  --  19*   GLUCOSE 129*  --  123*  BUN 28*  --  26*  CREATININE 1.56*  --  1.51*  CALCIUM 8.2*  --  7.8*  PHOS  --  3.4  --    GFR: Estimated Creatinine Clearance: 41.6 mL/min (A) (by C-G formula based on SCr of 1.51 mg/dL (H)). Liver Function Tests: Recent Labs  Lab 10/31/22 1934  AST 15  ALT 9  ALKPHOS 80  BILITOT 0.7  PROT 6.5  ALBUMIN 3.1*   Recent Labs  Lab 10/31/22 2344  LIPASE 90*   No results for input(s): "AMMONIA" in the last 168 hours. Coagulation Profile: Recent Labs  Lab 11/01/22 0450  INR 1.5*   Cardiac Enzymes: No results for input(s): "CKTOTAL", "CKMB", "CKMBINDEX", "TROPONINI" in the last 168 hours. BNP (last 3 results) No results for input(s): "PROBNP" in the last 8760 hours. HbA1C: No results for input(s): "HGBA1C" in the last 72 hours. CBG: No results for input(s): "GLUCAP" in the last 168 hours. Lipid Profile: No results for input(s): "CHOL", "HDL", "LDLCALC", "TRIG", "CHOLHDL", "LDLDIRECT" in the last 72 hours. Thyroid Function Tests: No results for input(s): "TSH", "T4TOTAL", "FREET4", "T3FREE", "THYROIDAB" in the last 72 hours. Anemia Panel: Recent Labs    10/31/22 2345  VITAMINB12 672  FOLATE 8.7  FERRITIN 17*  TIBC 302  IRON 59  RETICCTPCT 6.4*   Most Recent Urinalysis On File:     Component Value Date/Time   COLORURINE STRAW (A) 07/15/2021 1839   APPEARANCEUR CLEAR (A) 07/15/2021 1839   LABSPEC 1.014 07/15/2021 1839   PHURINE 5.0 07/15/2021 1839   GLUCOSEU 150 (A) 07/15/2021 1839   HGBUR NEGATIVE 07/15/2021 1839   BILIRUBINUR NEGATIVE 07/15/2021 1839   KETONESUR NEGATIVE 07/15/2021 1839   PROTEINUR NEGATIVE 07/15/2021 1839   NITRITE NEGATIVE 07/15/2021 1839   LEUKOCYTESUR NEGATIVE 07/15/2021 1839   Sepsis Labs: '@LABRCNTIP'$ (procalcitonin:4,lacticidven:4) Microbiology: No results found for this or any previous visit (from the past 240 hour(s)).    Radiology Studies last 3 days: CT ANGIO GI  BLEED  Result Date:  10/31/2022 CLINICAL DATA:  Upper GI bleed with chest pain. EXAM: CTA ABDOMEN AND PELVIS WITHOUT AND WITH CONTRAST TECHNIQUE: Multidetector CT imaging of the abdomen and pelvis was performed using the standard protocol during bolus administration of intravenous contrast. Multiplanar reconstructed images and MIPs were obtained and reviewed to evaluate the vascular anatomy. RADIATION DOSE REDUCTION: This exam was performed according to the departmental dose-optimization program which includes automated exposure control, adjustment of the mA and/or kV according to patient size and/or use of iterative reconstruction technique. CONTRAST:  27m OMNIPAQUE IOHEXOL 350 MG/ML SOLN COMPARISON:  CTs of abdomen and pelvis with contrast 07/17/2022 and 07/15/2021. FINDINGS: VASCULAR Aorta: There is extensive calcific plaque in the aortic wall without penetrating ulcer, critical aortic stenosis or dissection. Maximum aortic caliber 2.6 cm.Recommend follow-up every 5 years. Reference: J Am Coll Radiol 27591;[id number] Celiac: There are ostial calcifications causing a 50% vessel origin stenosis. There are additional nonstenosing wall calcifications more distally. There are patchy calcific plaques in the splenic artery and hepatic arteries. SMA: There moderate to heavy patchy wall calcifications but no more than 40% luminal stenosis. There is however, a high-grade proximal mixed plaque stenosis in the dominant right-sided trifurcation division of the SMA, with reconstitution distally as it extends to the left. Renals: Both renal arteries are single. There are patchy calcific plaques on the left-greater-than-right. There is up to 50-60% irregular stenosis of the left renal artery, right renal artery with up to 40% origin stenosis and otherwise well patent, with renovascular calcifications at both renal hila, more extensive on the left. IMA: Occluded. Inflow: Heavily calcified. 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 Slightly distal to this there is a 75% calcific stenosis focally just proximal the vessel bifurcation and no other focal stenosis. On the left there is a 60% common iliac arterial focal stenosis at the bifurcation. There is moderate irregular stenosis along both internal iliac arteries. Right external iliac artery demonstrating irregular up to 50% stenosis due to calcific plaques. Left external iliac artery occludes for a short-segment distal to its origin, then reconstitutes for the rest of its length but with only about 20% patency generally. Proximal Outflow: There is at least 80% calcific stenosis in the proximal right common femoral artery. The proximal deep and circumflex femoral arteries are heavily calcified and at least moderately stenotic. The proximal right SFA is occluded. There is a bypass graft from the right common femoral artery to the lowest point of the study near the base of the scrotum, which is patent. The graft arises distal to the proximal common femoral artery stenosis. On the left, the deep and circumflex femoral arteries are heavily calcified but at least partially patent. There is occlusion of the left common femoral artery, occlusion of the native left SFA, and an occluded left femoral bypass graft arising from the proximal common femoral artery and itself is proximally collapsed. There are multiple surgical clips in both inguinal areas. Veins: Patent as far as visualized. The left iliac veins are smaller than the right especially the left common iliac vein, which is compressed against the spine by the aorta and proximal left common iliac artery. Review of the MIP images confirms the above findings. NON-VASCULAR Lower chest: The cardiac size is normal. There are three-vessel coronary artery calcifications, aortic valve TAVR, and heavy calcification in the inferolateral mitral ring. The intraventricular blood pool is low in density consistent  with anemia.  There is interval worsening of bilateral lower lobe bronchial thickening, and on the right, interval worsening multifocal segmental and subsegmental bronchial/bronchiolar plugging in the middle and lower lobes, with patchy tree-in-bud interstitial changes also worsened. There is associated hazy airspace disease in the right middle lobe, posterior basal consolidation right lower lobe. Findings consistent with bronchopneumonia and probable aspiration pneumonitis. Please correlate clinically and consider aspiration precautions. Lung bases elsewhere are clear. Hepatobiliary: Mildly steatotic liver without mass. Surgically absent gallbladder without biliary dilatation. Pancreas: No abnormality. Spleen: No abnormality.  No splenomegaly. Adrenals/Urinary Tract: There is no adrenal mass. There are a few cortical hypodensities in both kidneys which are too small to characterize. These are unchanged. Again noted is a 1.7 cm complex septated cystic lesion in the superior pole of the right kidney. Although unchanged, MRI follow-up is recommended for further evaluation. There are renovascular calcifications at the renal hila but no urolithiasis is seen, no hydronephrosis or urinary obstruction. The bladder is unremarkable. 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 Endoscopy may be indicated but this seems unchanged. There is a debris-filled descending duodenal diverticulum again measuring 2.6 cm. The small and large bowel show no acute abnormality. The appendix is normal caliber. There is chronic dilatation of a left lower quadrant postsurgical small bowel segment, without associated obstruction. There is moderate retained stool ascending and transverse colon with terminal ileal fecal backup. There are scattered uncomplicated sigmoid diverticula. No contrast extravasation is seen into bowel. Lymphatic: No adenopathy is seen. Reproductive: There  is a normal size prostate with dystrophic calcifications centrally. Both testicles are in the scrotal sac. There are epididymal tail calcifications. No hydroceles. Other: There is no free air, free fluid, free hemorrhage or incarcerated hernia. Musculoskeletal: There is osteopenia with degenerative changes of the spine. There is mild chronic anterior wedging of the T12 vertebral body. L5 is transitional. There are chronic healed left pubic rami fractures. IMPRESSION: 1. No appreciable active GI bleeding. 2. Extensive aortic and branch vessel atherosclerosis. 3. 2.6 cm abdominal aortic caliber. Imaging follow-up every 5 years recommended. 4. 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 5. High-grade proximal mixed plaque stenosis in the dominant right-sided trifurcation division of the SMA, with reconstitution distally. 6. Bilateral renal artery stenoses, left greater than right. 7. Left external iliac artery proximal occlusion with about 20% reconstitution for the rest of its length. 8. Right common femoral artery bypass graft is patent as far as seen. 9. Bilateral native SFA occlusion, occluded left common femoral artery, and occluded left femoral bypass graft. 10. Interval worsening of bilateral lower lobe bronchial thickening and right middle and lower lobe segmental and subsegmental bronchial/bronchiolar plugging with associated tree-in-bud interstitial changes and increased airspace disease in the right middle lobe and posterior basal right lower lobe. Findings consistent with bronchopneumonia with high likelihood of aspiration etiology. Please correlate clinically and consider aspiration precautions. 11. 1.7 cm complex septated cystic lesion in the superior pole of the right kidney. Nonemergent MRI follow-up is recommended. 12. Constipation with diverticulosis and distal ileal fecal back up. 13. Remaining findings discussed above. Aortic Atherosclerosis (ICD10-I70.0). Electronically Signed   By: Telford Nab M.D.    On: 10/31/2022 20:56   DG Chest 2 View  Result Date: 10/31/2022 CLINICAL DATA:  Chest pain EXAM: CHEST - 2 VIEW COMPARISON:  07/17/2022 FINDINGS: Post sternotomy changes. No acute airspace disease or effusion. Streaky atelectasis or scarring at the posterior lung base. Valve prosthesis. Stable cardiomediastinal silhouette with aortic atherosclerosis. No  pneumothorax IMPRESSION: No active cardiopulmonary disease. Streaky atelectasis or scarring at the bases. Electronically Signed   By: Donavan Foil M.D.   On: 10/31/2022 17:59             LOS: 0 days      Emeterio Reeve, DO [facility] 11/01/2022, 3:26 PM    Dictation software may have been used to generate the above note. Typos may occur and escape review in typed/dictated notes. Please contact Dr Sheppard Coil directly for clarity if needed.  Staff may message me via secure chat in [place]  but this may not receive an immediate response,  please page me for urgent matters!  If 7PM-7AM, please contact night coverage [email or web address]

## 2022-11-01 NOTE — Assessment & Plan Note (Signed)
Known CKD, start patient on sodium bicarbonate

## 2022-11-01 NOTE — ED Notes (Signed)
Reported to MD that patient is c/o intense abdominal pain

## 2022-11-01 NOTE — Consult Note (Addendum)
Shawn Jennings , MD [address] [place], [place], [place], [address] [id number] [place], [place], [place], [address] Phone: [phone]  Fax: [phone]  Consultation  Referring Provider:    Dr. Sheppard Coil Primary Care Physician:  Pcp, No Primary Gastroenterologist:  Dr. Marius Ditch    Reason for Consultation:     Anemia  Date of Admission:  10/31/2022 Date of Consultation:  11/01/2022         HPI:   Shawn Jennings is a 77 y.o. 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  Presented emergency room with epigastric pain.  In the ER hemoglobin was found to be 6.3 g baseline 12.7 g 3 months back, normal troponin, elevated creatinine at baseline no elevated BUN/creatinine ratio.  Ferritin 17B12 normal folate normal iron 1.5 this morning hemoglobin is 6.4 g.  Had a CT angiogram GI bleed protocol that showed no active bleeding extensive aortic branch vessel atherosclerosis 2.6 cm abdominal aortic caliber 50% celiac artery origin stenosis high-grade proximal mixed plaque stenosis in the dominant right-sided trifurcation division of the SMA bilateral renal artery stenosis bilateral native SFA occlusion bronchial wall thickening right middle and lower lobe.  Complex cystic septated lesion in the right kidney MRI recommended.  Significant constipation also noted.   He denies any nosebleed, black-colored stools denies having a bowel movement for many days.  Complains of lower generalized abdominal discomfort denies any use of NSAIDs.  He states he is on a blood thinner but cannot recollect.last dose.  Last colonoscopy was many years back as per our records colonoscopy 2022 with Dr. Marius Ditch with colon prep was poor and a few sessile subcentimeter polyps were resected in the ascending colon and rectum.He also had an upper endoscopy at the same.  That showed no gross abnormality except gastritis.  The polyps were tubular adenoma polyps.  Biopsies of stomach showed healing  mucosal injury .  Past Medical History:  Diagnosis Date   Acquired dilation of ascending aorta and aortic root ([place])    a. 06/2022 Echo: Asc Ao 82m.   Aortic stenosis    a. Pt unaware of history but CT imaging 01/2019 consistent w/ AVR; b. 01/2019 Echo: Mild to mod AS. Mean grad 133mg; c. 06/2022 Echo: Nl fxn'ing prosth AoV. Asc Ao 4036m  CAD (coronary artery disease)    a. [place] 05[place] Cath: Sev 3VD, patent grafts->Med Rx; c. 06/2022 NSTEMI/PCI: LM diff dzs, LAD 100m45m 40, LCX 100p/m, OM3 mod dzs, RCA 100p, LIMA->LAD ok, RIMA->RPDA 40ost, VG->D2 30p/95p (3.0x22 Onyx Frontier DES)--< L Radial-OM3 20ost.   Chronic HFrEF (heart failure with reduced ejection fraction) (HCC)[place] a. 01/2019 Echo: EF 40-45%; b. 04/2020 Echo: EF 25-30%; c. 09/2020 Echo: EF 50-55%; d. 07/2021 Echo: EF 20-25%; e. 06/2022 Echo: EF 20-25%, glob HK, mild LVH, GrII DD, nl RV fxn, nl fxn'ing bioprosth AoV, Asc Ao 40mm58mCKD (chronic kidney disease), stage III (HCC)    COPD (chronic obstructive pulmonary disease) (HCC) [place]    Essential hypertension    GERD (gastroesophageal reflux disease)    Hyperlipidemia LDL goal <70    Ischemic cardiomyopathy    a. 01/2019 Echo: EF 40-45%; b. 04/2020 Echo: EF 25-30%; c. 09/2020 Echo: EF 50-55%; d. 07/2021 Echo: EF 20-25%; e. 06/2022 Echo: EF 20-25%.   PAD (peripheral artery disease) (HCC) 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   PAF (paroxysmal atrial fibrillation) (  [place])    a. CHA2DS2VASc = 4-->Xarelto '15mg'$  daily in setting of CKD.   Thyrotoxicosis    Tobacco abuse    Type II diabetes mellitus ([place])     Past Surgical History:  Procedure Laterality Date   CARDIAC CATHETERIZATION     CHOLECYSTECTOMY     COLONOSCOPY     COLONOSCOPY WITH PROPOFOL N/A 07/20/2021   Procedure: COLONOSCOPY WITH PROPOFOL;  Surgeon: Lin Landsman, MD;  [facility] END[facility]COPY;  Service: Gastroenterology;  Laterality: N/A;   CORONARY ARTERY BYPASS GRAFT     a. [place], [place]    CORONARY STENT INTERVENTION N/A 07/18/2022   Procedure: CORONARY STENT INTERVENTION;  Surgeon: Nelva Bush, MD;  Location: [place] CV LAB;  Service: Cardiovascular;  Laterality: N/A;   ENDOSCOPIC RETROGRADE CHOLANGIOPANCREATOGRAPHY (ERCP) WITH PROPOFOL N/A 10/19/2020   Procedure: ENDOSCOPIC RETROGRADE CHOLANGIOPANCREATOGRAPHY (ERCP) WITH PROPOFOL;  Surgeon: [name], MD;  Location: [facility] ENDOSCOPY;  Service: Endoscopy;  Laterality: N/A;   ERCP N/A 12/29/2020   Procedure: ENDOSCOPIC RETROGRADE CHOLANGIOPANCREATOGRAPHY (ERCP);  Surgeon: [name], MD;  Location: [facility] ENDOSCOPY;  Service: Endoscopy;  Laterality: N/A;   ESOPHAGOGASTRODUODENOSCOPY (EGD) WITH PROPOFOL N/A 07/18/2021   Procedure: ESOPHAGOGASTRODUODENOSCOPY (EGD) WITH PROPOFOL;  Surgeon: Lin Landsman, MD;  Location[facility] ENDOSCOPY;  Service: Gastroenterology;  Laterality: N/A;   Left AKA     RIGHT HEART CATH AND CORONARY/GRAFT ANGIOGRAPHY N/A 07/18/2022   Procedure: RIGHT HEART CATH AND CORONARY/GRAFT ANGIOGRAPHY;  Surgeon: Nelva Bush, MD;  Location: [place] CV LAB;  Service: Cardiovascular;  Laterality: N/A;   RIGHT/LEFT HEART CATH AND CORONARY ANGIOGRAPHY N/A 06/06/2020   Procedure: RIGHT/LEFT HEART CATH AND CORONARY ANGIOGRAPHY;  Surgeon: Wellington Hampshire, MD;  Location: [place] CV LAB;  Service: Cardiovascular;  Laterality: N/A;    Prior to Admission medications   Medication Sig Start Date End Date Taking? Authorizing Provider  albuterol (VENTOLIN HFA) 108 (90 Base) MCG/ACT inhaler Inhale 2 puffs into the lungs every 6 (six) hours as needed for wheezing or shortness of breath.    Yes [provider]  apixaban (ELIQUIS) 5 MG TABS tablet Take 1 tablet (5 mg total) by mouth 2 (two) times daily. 07/22/21  Yes Wieting, Richard, MD  dapagliflozin propanediol (FARXIGA) 10 MG TABS tablet Take 1 tablet (10 mg total) by mouth daily. 11/23/21  Yes Hackney, Otila Kluver A, FNP  DULoxetine (CYMBALTA) 30 MG  capsule Take 30 mg by mouth 2 (two) times daily.   Yes [provider]  furosemide (LASIX) 20 MG tablet Take 1 tablet (20 mg total) by mouth daily. 11/10/21  Yes Jennye Boroughs, MD  isosorbide mononitrate (IMDUR) 30 MG 24 hr tablet Take 1 tablet (30 mg total) by mouth daily. 03/11/20 05/27/29 Yes Sharen Hones, MD  lamoTRIgine (LAMICTAL) 25 MG tablet Take 25 mg by mouth 2 (two) times daily.   Yes [provider]  Melatonin 5 MG CAPS Take 5 mg by mouth at bedtime.   Yes [provider]  montelukast (SINGULAIR) 10 MG tablet Take 10 mg by mouth at bedtime.   Yes [provider]  Multiple Vitamins-Minerals (PRESERVISION AREDS) CAPS Take 1 capsule by mouth in the morning and at bedtime.   Yes [provider]  nitroGLYCERIN (NITROSTAT) 0.4 MG SL tablet Place 0.4 mg under the tongue every 5 (five) minutes as needed for chest pain. Give one tablet sublingually q5 minutes x 3 doses PRN for chest pain relief   Yes [provider]  pantoprazole (PROTONIX) 40 MG tablet  Take 40 mg by mouth daily.   Yes [provider]  sacubitril-valsartan (ENTRESTO) 24-26 MG Take 1 tablet by mouth 2 (two) times daily.   Yes [provider]  Semaglutide, 1 MG/DOSE, (OZEMPIC, 1 MG/DOSE,) 4 MG/3ML SOPN Inject 1 mg into the skin once a week.   Yes [provider]  spironolactone (ALDACTONE) 25 MG tablet Take 1 tablet (25 mg total) by mouth daily. 06/11/22  Yes Hackney, Tina A, FNP  ACETAMINOPHEN EXTRA STRENGTH 500 MG capsule Take 1 capsule by mouth every 6 (six) hours as needed. Patient not taking: Reported on 11/01/2022 01/01/22   [provider]  atorvastatin (LIPITOR) 80 MG tablet Take 80 mg by mouth at bedtime. Patient not taking: Reported on 11/01/2022    [provider]  BIOFREEZE COOL THE PAIN 4 % GEL Apply 1 Application topically 3 (three) times daily as needed. Apply to right shoulder three times daily as needed for pain Patient not  taking: Reported on 11/01/2022 02/26/22   [provider]  carvedilol (COREG) 3.125 MG tablet Take 1 tablet (3.125 mg total) by mouth 2 (two) times daily. Patient not taking: Reported on 11/01/2022 08/23/21   Alisa Graff, FNP  D 1000 25 MCG (1000 UT) capsule Take 1 capsule by mouth daily. Patient not taking: Reported on 11/01/2022 10/25/21   [provider]  ferrous sulfate 325 (65 FE) MG tablet Take 325 mg by mouth every other day.    [provider]  hydrOXYzine (ATARAX/VISTARIL) 25 MG tablet Take 25 mg by mouth every 6 (six) hours as needed (allergy symptoms). Patient not taking: Reported on 11/01/2022    [provider]  loratadine (CLARITIN) 10 MG tablet Take 10 mg by mouth daily. Patient not taking: Reported on 11/01/2022    [provider]  vitamin B-12 (CYANOCOBALAMIN) 1000 MCG tablet Take 1,000 mcg by mouth daily. Patient not taking: Reported on 11/01/2022    [provider]    Family History  Problem Relation Age of Onset   Heart attack Mother        died @ 36.   Other Father        never knew his father.   Other Sister        overdose of sleeping pills.   Heart disease Brother        died in his 31's   Other Sister        complication of abd surgery @ 34   CAD Sister      Social History   Tobacco Use   Smoking status: Every Day    Packs/day: 1.00    Years: 62.00    Total pack years: 62.00    Types: Cigarettes   Smokeless tobacco: Never  Vaping Use   Vaping Use: Never used  Substance Use Topics   Alcohol use: Never   Drug use: Never    Allergies as of 10/31/2022   (No Known Allergies)    Review of Systems:    All systems reviewed and negative except where noted in HPI.   Physical Exam:  Vital signs in last 24 hours: Temp:  [97.5 F (36.4 C)-98.4 F (36.9 C)] 98.4 F (36.9 C) (01/11 0839) Pulse Rate:  [64-80] 64 (01/11 0839) Resp:  [16-29] 20 (01/11 0839) BP: (82-115)/(51-68) 111/55 (01/11 0839) SpO2:   [90 %-100 %] 100 % (01/11 0700) Weight:  [73 kg] 73 kg (01/10 1720)   General:   Pleasant, cooperative in NAD Head:  Normocephalic  and atraumatic. Eyes:   No icterus.   Conjunctiva pink. PERRLA. Ears:  Normal auditory acuity. Neck:  Supple; no masses or thyroidomegaly Lungs: Respirations even and unlabored. Lungs clear to auscultation bilaterally.   No wheezes, crackles, or rhonchi.  Heart:  Regular rate and rhythm;  Without murmur, clicks, rubs or gallops Abdomen:  Soft, nondistended, nontender. Normal bowel sounds. No appreciable masses or hepatomegaly.  No rebound or guarding.  Neurologic:  Alert and oriented x3;  grossly normal neurologically. Psych:  Alert and cooperative. Normal affect.  LAB RESULTS: Recent Labs    10/31/22 2355 11/01/22 0450 11/01/22 0750  WBC 16.2* 17.5* 16.1*  HGB 6.5* 6.8* 6.4*  HCT 21.8* 23.2* 21.8*  PLT 332 335 332   BMET Recent Labs    10/31/22 1722 11/01/22 0750  NA 136 139  K 4.5 4.1  CL 111 114*  CO2 16* 19*  GLUCOSE 129* 123*  BUN 28* 26*  CREATININE 1.56* 1.51*  CALCIUM 8.2* 7.8*   LFT Recent Labs    10/31/22 1934  PROT 6.5  ALBUMIN 3.1*  AST 15  ALT 9  ALKPHOS 80  BILITOT 0.7  BILIDIR <0.1  IBILI NOT CALCULATED   PT/INR Recent Labs    11/01/22 0450  LABPROT 18.3*  INR 1.5*    STUDIES: CT ANGIO GI BLEED  Result Date: 10/31/2022 CLINICAL DATA:  Upper GI bleed with chest pain. EXAM: CTA ABDOMEN AND PELVIS WITHOUT AND WITH CONTRAST TECHNIQUE: Multidetector CT imaging of the abdomen and pelvis was performed using the standard protocol during bolus administration of intravenous contrast. Multiplanar reconstructed images and MIPs were obtained and reviewed to evaluate the vascular anatomy. RADIATION DOSE REDUCTION: This exam was performed according to the departmental dose-optimization program which includes automated exposure control, adjustment of the mA and/or kV according to patient size and/or use of iterative  reconstruction technique. CONTRAST:  43m OMNIPAQUE IOHEXOL 350 MG/ML SOLN COMPARISON:  CTs of abdomen and pelvis with contrast 07/17/2022 and 07/15/2021. FINDINGS: VASCULAR Aorta: There is extensive calcific plaque in the aortic wall without penetrating ulcer, critical aortic stenosis or dissection. Maximum aortic caliber 2.6 cm.Recommend follow-up every 5 years. Reference: J Am Coll Radiol 25681;27:517-001 Celiac: There are ostial calcifications causing a 50% vessel origin stenosis. There are additional nonstenosing wall calcifications more distally. There are patchy calcific plaques in the splenic artery and hepatic arteries. SMA: There moderate to heavy patchy wall calcifications but no more than 40% luminal stenosis. There is however, a high-grade proximal mixed plaque stenosis in the dominant right-sided trifurcation division of the SMA, with reconstitution distally as it extends to the left. Renals: Both renal arteries are single. There are patchy calcific plaques on the left-greater-than-right. There is up to 50-60% irregular stenosis of the left renal artery, right renal artery with up to 40% origin stenosis and otherwise well patent, with renovascular calcifications at both renal hila, more extensive on the left. IMA: Occluded. Inflow: Heavily calcified. 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 Slightly distal to this there is a 75% calcific stenosis focally just proximal the vessel bifurcation and no other focal stenosis. On the left there is a 60% common iliac arterial focal stenosis at the bifurcation. There is moderate irregular stenosis along both internal iliac arteries. Right external iliac artery demonstrating irregular up to 50% stenosis due to calcific plaques. Left external iliac artery occludes for a short-segment distal to its origin, then reconstitutes for the rest of  its length but with only about 20% patency generally. Proximal  Outflow: There is at least 80% calcific stenosis in the proximal right common femoral artery. The proximal deep and circumflex femoral arteries are heavily calcified and at least moderately stenotic. The proximal right SFA is occluded. There is a bypass graft from the right common femoral artery to the lowest point of the study near the base of the scrotum, which is patent. The graft arises distal to the proximal common femoral artery stenosis. On the left, the deep and circumflex femoral arteries are heavily calcified but at least partially patent. There is occlusion of the left common femoral artery, occlusion of the native left SFA, and an occluded left femoral bypass graft arising from the proximal common femoral artery and itself is proximally collapsed. There are multiple surgical clips in both inguinal areas. Veins: Patent as far as visualized. The left iliac veins are smaller than the right especially the left common iliac vein, which is compressed against the spine by the aorta and proximal left common iliac artery. Review of the MIP images confirms the above findings. NON-VASCULAR Lower chest: The cardiac size is normal. There are three-vessel coronary artery calcifications, aortic valve TAVR, and heavy calcification in the inferolateral mitral ring. The intraventricular blood pool is low in density consistent with anemia. There is interval worsening of bilateral lower lobe bronchial thickening, and on the right, interval worsening multifocal segmental and subsegmental bronchial/bronchiolar plugging in the middle and lower lobes, with patchy tree-in-bud interstitial changes also worsened. There is associated hazy airspace disease in the right middle lobe, posterior basal consolidation right lower lobe. Findings consistent with bronchopneumonia and probable aspiration pneumonitis. Please correlate clinically and consider aspiration precautions. Lung bases elsewhere are clear. Hepatobiliary: Mildly steatotic  liver without mass. Surgically absent gallbladder without biliary dilatation. Pancreas: No abnormality. Spleen: No abnormality.  No splenomegaly. Adrenals/Urinary Tract: There is no adrenal mass. There are a few cortical hypodensities in both kidneys which are too small to characterize. These are unchanged. Again noted is a 1.7 cm complex septated cystic lesion in the superior pole of the right kidney. Although unchanged, MRI follow-up is recommended for further evaluation. There are renovascular calcifications at the renal hila but no urolithiasis is seen, no hydronephrosis or urinary obstruction. The bladder is unremarkable. 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 Endoscopy may be indicated but this seems unchanged. There is a debris-filled descending duodenal diverticulum again measuring 2.6 cm. The small and large bowel show no acute abnormality. The appendix is normal caliber. There is chronic dilatation of a left lower quadrant postsurgical small bowel segment, without associated obstruction. There is moderate retained stool ascending and transverse colon with terminal ileal fecal backup. There are scattered uncomplicated sigmoid diverticula. No contrast extravasation is seen into bowel. Lymphatic: No adenopathy is seen. Reproductive: There is a normal size prostate with dystrophic calcifications centrally. Both testicles are in the scrotal sac. There are epididymal tail calcifications. No hydroceles. Other: There is no free air, free fluid, free hemorrhage or incarcerated hernia. Musculoskeletal: There is osteopenia with degenerative changes of the spine. There is mild chronic anterior wedging of the T12 vertebral body. L5 is transitional. There are chronic healed left pubic rami fractures. IMPRESSION: 1. No appreciable active GI bleeding. 2. Extensive aortic and branch vessel atherosclerosis. 3. 2.6 cm abdominal aortic caliber.  Imaging follow-up every 5 years recommended. 4. 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 5. High-grade proximal mixed plaque stenosis in  the dominant right-sided trifurcation division of the SMA, with reconstitution distally. 6. Bilateral renal artery stenoses, left greater than right. 7. Left external iliac artery proximal occlusion with about 20% reconstitution for the rest of its length. 8. Right common femoral artery bypass graft is patent as far as seen. 9. Bilateral native SFA occlusion, occluded left common femoral artery, and occluded left femoral bypass graft. 10. Interval worsening of bilateral lower lobe bronchial thickening and right middle and lower lobe segmental and subsegmental bronchial/bronchiolar plugging with associated tree-in-bud interstitial changes and increased airspace disease in the right middle lobe and posterior basal right lower lobe. Findings consistent with bronchopneumonia with high likelihood of aspiration etiology. Please correlate clinically and consider aspiration precautions. 11. 1.7 cm complex septated cystic lesion in the superior pole of the right kidney. Nonemergent MRI follow-up is recommended. 12. Constipation with diverticulosis and distal ileal fecal back up. 13. Remaining findings discussed above. Aortic Atherosclerosis (ICD10-I70.0). Electronically Signed   By: Telford Nab M.D.   On: 10/31/2022 20:56   DG Chest 2 View  Result Date: 10/31/2022 CLINICAL DATA:  Chest pain EXAM: CHEST - 2 VIEW COMPARISON:  07/17/2022 FINDINGS: Post sternotomy changes. No acute airspace disease or effusion. Streaky atelectasis or scarring at the posterior lung base. Valve prosthesis. Stable cardiomediastinal silhouette with aortic atherosclerosis. No pneumothorax IMPRESSION: No active cardiopulmonary disease. Streaky atelectasis or scarring at the bases. Electronically Signed   By: Donavan Foil M.D.   On: 10/31/2022 17:59      Impression / Plan:   Shawn Jennings is a 77 y.o. y/o male  with history of peripheral arterial disease left above-knee amputation present to the ER with epigastric pain found to have a 6 g drop in hemoglobin from baseline of 12.7 g 3 months back.  Iron low.  CT angiogram show no active bleeding but incidental findings of cyst in the kidney and significant constipation noted.  Stenosis of various vessels were also noted.  Plan 1.  Iron deficiency anemia will require EGD and colonoscopy once hemoglobin over 7 g and he is off Eliquis for 2 days, can plan on Sunday and Monday.  If negative will require capsule study of the small bowel 2.  In view of severe constipation will give him half a gallon of GoLytely today to drink and clear out his colon subsequently start  bowel prep on Saturday 3.  Follow-up cystic lesion in the kidney may require MRI. 4.  IV iron 5.  Monitor CBC and transfuse as needed   I have discussed alternative options, risks & benefits,  which include, but are not limited to, bleeding, infection, perforation,respiratory complication & drug reaction.  The patient agrees with this plan & written consent will be obtained.     Thank you for involving me in the care of this patient.      LOS: 0 days   Shawn Bellows, MD  11/01/2022, 8:59 AM

## 2022-11-01 NOTE — Assessment & Plan Note (Signed)
Holding anticoagulation as above, continue with telemetry as ordered in ER

## 2022-11-01 NOTE — Evaluation (Addendum)
Clinical/Bedside Swallow Evaluation Patient Details  Name: Shawn Jennings MRN: [id number] Date of Birth: Mar 18, 1946  Today's Date: 11/01/2022 Time: SLP Start Time (ACUTE ONLY): 58 SLP Stop Time (ACUTE ONLY): 1105 SLP Time Calculation (min) (ACUTE ONLY): 50 min  Past Medical History:  Past Medical History:  Diagnosis Date   Acquired dilation of ascending aorta and aortic root ([place])    a. 06/2022 Echo: Asc Ao 15m.   Aortic stenosis    a. Pt unaware of history but CT imaging 01/2019 consistent w/ AVR; b. 01/2019 Echo: Mild to mod AS. Mean grad 155mg; c. 06/2022 Echo: Nl fxn'ing prosth AoV. Asc Ao 4014m  CAD (coronary artery disease)    a. [place] 05[place] Cath: Sev 3VD, patent grafts->Med Rx; c. 06/2022 NSTEMI/PCI: LM diff dzs, LAD 100m29m 40, LCX 100p/m, OM3 mod dzs, RCA 100p, LIMA->LAD ok, RIMA->RPDA 40ost, VG->D2 30p/95p (3.0x22 [employer] DES)--< L Radial-OM3 20ost.   Chronic HFrEF (heart failure with reduced ejection fraction) (HCC)[place] a. 01/2019 Echo: EF 40-45%; b. 04/2020 Echo: EF 25-30%; c. 09/2020 Echo: EF 50-55%; d. 07/2021 Echo: EF 20-25%; e. 06/2022 Echo: EF 20-25%, glob HK, mild LVH, GrII DD, nl RV fxn, nl fxn'ing bioprosth AoV, Asc Ao 40mm23mCKD (chronic kidney disease), stage III (HCC)    COPD (chronic obstructive pulmonary disease) (HCC) [place]    Essential hypertension    GERD (gastroesophageal reflux disease)    Hyperlipidemia LDL goal <70    Ischemic cardiomyopathy    a. 01/2019 Echo: EF 40-45%; b. 04/2020 Echo: EF 25-30%; c. 09/2020 Echo: EF 50-55%; d. 07/2021 Echo: EF 20-25%; e. 06/2022 Echo: EF 20-25%.   PAD (peripheral artery disease) (HCC) [place]. 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   PAF (paroxysmal atrial fibrillation) (HCC)    a. CHA2DS2VASc = 4-->Xarelto '15mg'$  daily in setting of CKD.   Thyrotoxicosis    Tobacco abuse    Type II diabetes mellitus (HCC) [place]:  Past Surgical History:  Procedure Laterality Date   CARDIAC  CATHETERIZATION     CHOLECYSTECTOMY     COLONOSCOPY     COLONOSCOPY WITH PROPOFOL N/A 07/20/2021   Procedure: COLONOSCOPY WITH PROPOFOL;  Surgeon: VangaLin Landsman  [facility];  Service: Gastroenterology;  Laterality: N/A;   CORONARY ARTERY BYPASS GRAFT     a. 1994 [place]A 07/18/2022   Procedure: CORONARY STENT INTERVENTION;  Surgeon: End, Nelva Bush  [facility];  Service: Cardiovascular;  Laterality: N/A;   ENDOSCOPIC RETROGRADE CHOLANGIOPANCREATOGRAPHY (ERCP) WITH PROPOFOL N/A 10/19/2020   Procedure: ENDOSCOPIC RETROGRADE CHOLANGIOPANCREATOGRAPHY (ERCP) WITH PROPOFOL;  Surgeon: Wohl,[name]  [facility]: [facility];  Service: Endoscopy;  Laterality: N/A;   ERCP N/A 12/29/2020   Procedure: ENDOSCOPIC RETROGRADE CHOLANGIOPANCREATOGRAPHY (ERCP);  Surgeon: Wohl,[name]  [facility];  Service: Endoscopy;  Laterality: N/A;   ESOPHAGOGASTRODUODENOSCOPY (EGD) WITH PROPOFOL N/A 07/18/2021   Procedure: ESOPHAGOGASTRODUODENOSCOPY (EGD) WITH PROPOFOL;  Surgeon: VangaLin Landsman  [facility];  Service: Gastroenterology;  Laterality: N/A;   Left AKA     RIGHT HEART CATH AND CORONARY/GRAFT ANGIOGRAPHY N/A 07/18/2022   Procedure: RIGHT HEART CATH AND CORONARY/GRAFT ANGIOGRAPHY;  Surgeon: End, Nelva Bush  [facility];  Service: Cardiovascular;  Laterality: N/A;   RIGHT/LEFT HEART CATH AND CORONARY ANGIOGRAPHY N/A 06/06/2020   Procedure: RIGHT/LEFT HEART CATH AND CORONARY  ANGIOGRAPHY;  Surgeon: Wellington Hampshire, MD;  Location: [facility];  Service: Cardiovascular;  Laterality: N/A;   HPI:  Pt is a 77 y.o. male  w/ past medical history of CAD, heart failure, CKD, CABG, COPD, GERD, oxygen supplementation at night 2L, etoh, depression, hypertension, hyperlipidemia, peripheral artery disease, paroxysmal atrial fibrillation on Eliquis, diabetes, left leg  amputation presents with epigastric pain for the past 4 days.  Denies nausea or vomiting, denies GI bleeding.  Some exertional shortness of breath and generalized weakness. Pt had upper GI bleeding back in 2022 which showed gastropathy.   CXR: No active cardiopulmonary disease. Streaky atelectasis or scarring  at the bases.  He resides at a [facility].  Gi was consulted this admit.  OF NOTE: receives Palliative Care services baseline per chart notes/visits.    Assessment / Plan / Recommendation  Clinical Impression   Pt seen for BSE today. Pt awakened easily, verbal. Required min verbal cues for follow through w/ tasks; full support in positioning upright in bed. Extremely dry mouth; oral care provided -- no bottom Dentition.  On [place] O2 2L; afebrile, WBC elevated.  Per NSG report, pt "choked" this morning attempting to drink thin liquids.   Pt appears to oropharyngeal phase dysphagia in setting of overt s/s of dysphagia, aspiration w/ trials of thin liquids w/ NSG(significant coughing event reported). PER A PALLIATIVE CARE NOTE, PT HAS A H/O ASPIRATION PNEUMONIA LISTED IN THE NOTE. However, previous BSE assessments in 2021 and 2022 did not reveal pharyngeal phase swallowing deficits, and pt was recommended mech soft/regular diet consistencies (no bottom dentition for effective mastication noted).   In setting of pt's new illness/hospitalization, baseline/CHRONIC medical issues including Pulmonary decline, and current weakness/deconditioning along w/ this morning's dysphagia event w/ NSG, pt appears at increased risk for aspiration/aspiration pneumonia.   During this assessment, trials of Nectar consistency liquids and purees/softened solids were given (see above for reason). Pt demonstrated functional swallowing w/ no overt neuromuscular deficits noted and no overt clinical s/s of aspiration noted during/post po trials. No overt WOB w/ the exertion of po tasks but Rest Breaks were given. Pt appears  at reduced risk for aspiration following general aspiration precautions w/ a dysphagia diet at this time.  During po trials, pt consumed consistencies w/ no overt coughing, decline in vocal quality, or sustained change in respiratory presentation during/post trials. Rest Breaks and time for Belching and to calm RR when any WOB noted from the exertion of self-feeding, and po's. Oral phase appeared grossly [facility] w/ timely bolus management, mashing, and control of bolus propulsion for A-P transfer for swallowing. Oral clearing achieved w/ all trial consistencies given min time when needed.  OM Exam appeared [facility] w/ no unilateral weakness noted. Speech intelligible. Pt fed self w/ support.  Recommend a Dysphagia level 2 w/ moistened foods; Nectar liquids. Recommend general aspiration precautions, Pills WHOLE vs Crushed in Puree for safer, easier swallowing. Tray setup and support w/ meals; positioning support d/t weakness. Less distractions and talking at meals. Rest Breaks during meals for conservation of energy.  Education provided to pt on above including the diet; food consistencies and easy to eat options; general aspiration precautions; need for Rest Breaks during meals to calm Breathing; GERD precautions; pill in puree. Discussed ways to conserve energy during meals to lessen risk for choking/aspiration.   ST services will f/u w/ pt tomorrow for trials to upgrade diet as appropriate. NSG/MD updated also.  SLP Visit Diagnosis: Dysphagia, oropharyngeal phase (  R13.12)    Aspiration Risk  Mild aspiration risk;Risk for inadequate nutrition/hydration    Diet Recommendation   Dysphagia level 2 w/ moistened foods; Nectar liquids. Recommend general aspiration precautions. Tray setup and support w/ meals; positioning support d/t weakness. Less distractions and talking at meals. Rest Breaks during meals for conservation of energy.   Medication Administration: Whole meds with puree (vs need to Crush)    Other   Recommendations Recommended Consults:  (Palliative Care for [place]; Dietician) Oral Care Recommendations: Oral care BID;Oral care before and after PO;Staff/trained caregiver to provide oral care (denture care) Other Recommendations: Order thickener from pharmacy;Prohibited food (jello, ice cream, thin soups);Remove water pitcher;Have oral suction available    Recommendations for follow up therapy are one component of a multi-disciplinary discharge planning process, led by the attending physician.  Recommendations may be updated based on patient status, additional functional criteria and insurance authorization.  Follow up Recommendations Skilled nursing-short term rehab (<3 hours/day) (TBD)      Assistance Recommended at Discharge  Intermittent-full  Functional Status Assessment Patient has had a recent decline in their functional status and/or demonstrates limited ability to make significant improvements in function in a reasonable and predictable amount of time  Frequency and Duration min 2x/week  2 weeks       Prognosis Prognosis for Safe Diet Advancement: Fair Barriers to Reach Goals: Cognitive deficits;Time post onset;Severity of deficits Barriers/Prognosis Comment: baseline      Swallow Study   General Date of Onset: 10/31/22 HPI: Pt is a 77 y.o. male  w/ past medical history of CAD, heart failure, CKD, CABG, COPD, oxygen supplementation at night 2L, etoh, depression, hypertension, hyperlipidemia, peripheral artery disease, paroxysmal atrial fibrillation on Eliquis, diabetes, left leg amputation presents with epigastric pain for the past 4 days.  Denies nausea or vomiting, denies GI bleeding.  Some exertional shortness of breath and generalized weakness.     Of note he has had upper GI bleeding back in 2022 which showed gastropathy.   CXR: No active cardiopulmonary disease. Streaky atelectasis or scarring  at the bases.  He resides at a [employer].   OF NOTE: receives Palliative  Care services baseline. Type of Study: Bedside Swallow Evaluation Previous Swallow Assessment: 2021, 2022 Diet Prior to this Study: Regular;Thin liquids Temperature Spikes Noted: No (wbc 16.1 declining) Respiratory Status: Nasal cannula (2L) History of Recent Intubation: No Behavior/Cognition: Alert;Cooperative;Pleasant mood;Distractible;Requires cueing Oral Cavity Assessment: Dry Oral Care Completed by SLP: Yes Oral Cavity - Dentition: Dentures, top (no bottom dentition) Vision: Functional for self-feeding Self-Feeding Abilities: Able to feed self;Needs assist;Needs set up;Total assist Patient Positioning: Upright in bed (needed support and positioning) Baseline Vocal Quality: Normal (gravely) Volitional Cough: Strong;Congested Volitional Swallow: Able to elicit    Oral/Motor/Sensory Function Overall Oral Motor/Sensory Function: Within functional limits   Ice Chips Ice chips: Not tested   Thin Liquid Thin Liquid: Not tested    Nectar Thick Nectar Thick Liquid: Within functional limits Presentation: Self Fed;Straw;Spoon (supported; ~3 ozs)   Honey Thick Honey Thick Liquid: Not tested   Puree Puree: Within functional limits Presentation: Spoon (fed; 10 trials)   Solid     Solid: Impaired Presentation: Spoon (fed; 8 trials) Oral Phase Impairments: Impaired mastication (missing bottom dentition) Oral Phase Functional Implications: Impaired mastication Pharyngeal Phase Impairments:  (none)        [name], MS, CCC-SLP Speech Language Pathologist Rehab Services; [facility] [phone] (ascom) Andreia Gandolfi 11/01/2022,4:44 PM

## 2022-11-01 NOTE — H&P (Signed)
History and Physical    Patient: Shawn Jennings WEX:[id number] DOB: 1946-02-18 DOA: 10/31/2022 DOS: the patient was seen and examined on 11/01/2022 PCP: Pcp, No  Patient coming from: ALF/ILF  Chief Complaint:  Chief Complaint  Patient presents with   Chest Pain   HPI: Shawn Jennings is a 77 y.o. 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  Patient is wheelchair-bound.  Patient reports that since earlier this a.m. patient is having mild epigastric pain that is "annoying "no radiation no aggravating or relieving factors.  No associated diarrhea or vomiting or fevers.  Patient came to the ER for evaluation of this.  Patient reports no preceding symptoms like this in the past.  ER evaluation was notable for marked anemia,Guaiac positive stool.  Patient has since received 1 unit of PRBC in the ER.  Patient at this time does not have any symptoms including no epigastric pain.  Patient denies any melena denies any nasal bleeding gum bleeding.  Patient is wheelchair-bound therefore does not report having had any presyncope or exertional shortness of breath. Review of Systems: Point review of system negative except as above Past Medical History:  Diagnosis Date   Acquired dilation of ascending aorta and aortic root ([place])    a. 06/2022 Echo: Asc Ao 36m.   Aortic stenosis    a. Pt unaware of history but CT imaging 01/2019 consistent w/ AVR; b. 01/2019 Echo: Mild to mod AS. Mean grad 129mg; c. 06/2022 Echo: Nl fxn'ing prosth AoV. Asc Ao 4048m  CAD (coronary artery disease)    a. [place] Cath: Sev 3VD, patent grafts->Med Rx; c. 06/2022 NSTEMI/PCI: LM diff dzs, LAD 100m93m 40, LCX 100p/m, OM3 mod dzs, RCA 100p, LIMA->LAD ok, RIMA->RPDA 40ost, VG->D2 30p/95p (3.0x22 Onyx Frontier DES)--< L Radial-OM3 20ost.   Chronic HFrEF (heart failure with reduced ejection fraction) (HCC)[place] a. 01/2019 Echo: EF 40-45%; b. 04/2020 Echo: EF  25-30%; c. 09/2020 Echo: EF 50-55%; d. 07/2021 Echo: EF 20-25%; e. 06/2022 Echo: EF 20-25%, glob HK, mild LVH, GrII DD, nl RV fxn, nl fxn'ing bioprosth AoV, Asc Ao 40mm44mCKD (chronic kidney disease), stage III (HCC)    COPD (chronic obstructive pulmonary disease) (HCC) [place]    Essential hypertension    GERD (gastroesophageal reflux disease)    Hyperlipidemia LDL goal <70    Ischemic cardiomyopathy    a. 01/2019 Echo: EF 40-45%; b. 04/2020 Echo: EF 25-30%; c. 09/2020 Echo: EF 50-55%; d. 07/2021 Echo: EF 20-25%; e. 06/2022 Echo: EF 20-25%.   PAD (peripheral artery disease) (HCC) [place]. 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   PAF (paroxysmal atrial fibrillation) (HCC)    a. CHA2DS2VASc = 4-->Xarelto '15mg'$  daily in setting of CKD.   Thyrotoxicosis    Tobacco abuse    Type II diabetes mellitus (HCC) [place]:  Procedure Laterality Date   CARDIAC CATHETERIZATION     CHOLECYSTECTOMY     COLONOSCOPY     COLONOSCOPY WITH PROPOFOL N/A 07/20/2021   Procedure: COLONOSCOPY WITH PROPOFOL;  Surgeon: VangaLin Landsman  [facility];  Service: Gastroenterology;  Laterality: N/A;   CORONARY ARTERY BYPASS GRAFT     a. 1994 [place]A 07/18/2022   Procedure: CORONARY STENT INTERVENTION;  Surgeon: End, Nelva Bush  [facility];  Service: Cardiovascular;  Laterality: N/A;  ENDOSCOPIC RETROGRADE CHOLANGIOPANCREATOGRAPHY (ERCP) WITH PROPOFOL N/A 10/19/2020   Procedure: ENDOSCOPIC RETROGRADE CHOLANGIOPANCREATOGRAPHY (ERCP) WITH PROPOFOL;  Surgeon: [name], MD;  Location[facility];  Service: Endoscopy;  Laterality: N/A;   ERCP N/A 12/29/2020   Procedure: ENDOSCOPIC RETROGRADE CHOLANGIOPANCREATOGRAPHY (ERCP);  Surgeon: [name], MD;  [facility];  Service: Endoscopy;  Laterality: N/A;   ESOPHAGOGASTRODUODENOSCOPY (EGD) WITH PROPOFOL N/A 07/18/2021   Procedure: ESOPHAGOGASTRODUODENOSCOPY (EGD) WITH PROPOFOL;   Surgeon: Lin Landsman, MD;  [facility];  Service: Gastroenterology;  Laterality: N/A;   Left AKA     RIGHT HEART CATH AND CORONARY/GRAFT ANGIOGRAPHY N/A 07/18/2022   Procedure: RIGHT HEART CATH AND CORONARY/GRAFT ANGIOGRAPHY;  Surgeon: Nelva Bush, MD;  Location: [place] CV LAB;  Service: Cardiovascular;  Laterality: N/A;   RIGHT/LEFT HEART CATH AND CORONARY ANGIOGRAPHY N/A 06/06/2020   Procedure: RIGHT/LEFT HEART CATH AND CORONARY ANGIOGRAPHY;  Surgeon: Wellington Hampshire, MD;  Location: [place] CV LAB;  Service: Cardiovascular;  Laterality: N/A;   Social History:  reports that he has been smoking cigarettes. He has a 62.00 pack-year smoking history. He has never used smokeless tobacco. He reports that he does not drink alcohol and does not use drugs.  No Known Allergies  Family History  Problem Relation Age of Onset   Heart attack Mother        died @ 29.   Other Father        never knew his father.   Other Sister        overdose of sleeping pills.   Heart disease Brother        died in his 51's   Other Sister        complication of abd surgery @ 59   CAD Sister     Prior to Admission medications   Medication Sig Start Date End Date Taking? Authorizing Provider  ACETAMINOPHEN EXTRA STRENGTH 500 MG capsule Take 1 capsule by mouth every 6 (six) hours as needed. 01/01/22   [provider]  albuterol (VENTOLIN HFA) 108 (90 Base) MCG/ACT inhaler Inhale 2 puffs into the lungs every 6 (six) hours as needed for wheezing or shortness of breath.     [provider]  apixaban (ELIQUIS) 5 MG TABS tablet Take 1 tablet (5 mg total) by mouth 2 (two) times daily. 07/22/21   Loletha Grayer, MD  atorvastatin (LIPITOR) 80 MG tablet Take 80 mg by mouth at bedtime.    [provider]  BIOFREEZE COOL THE PAIN 4 % GEL Apply 1 Application topically 3 (three) times daily as needed. Apply to right shoulder three times daily as needed for pain  02/26/22   [provider]  carvedilol (COREG) 3.125 MG tablet Take 1 tablet (3.125 mg total) by mouth 2 (two) times daily. 08/23/21   Alisa Graff, FNP  D 1000 25 MCG (1000 UT) capsule Take 1 capsule by mouth daily. 10/25/21   [provider]  dapagliflozin propanediol (FARXIGA) 10 MG TABS tablet Take 1 tablet (10 mg total) by mouth daily. 11/23/21   Alisa Graff, FNP  DULoxetine (CYMBALTA) 30 MG capsule Take 30 mg by mouth 2 (two) times daily.    [provider]  ferrous sulfate 325 (65 FE) MG tablet Take 325 mg by mouth every other day.    [provider]  furosemide (LASIX) 20 MG tablet Take 1 tablet (20 mg total) by mouth daily. 11/10/21   Jennye Boroughs, MD  hydrOXYzine (ATARAX/VISTARIL) 25 MG tablet Take  25 mg by mouth every 6 (six) hours as needed (allergy symptoms).    [provider]  isosorbide mononitrate (IMDUR) 30 MG 24 hr tablet Take 1 tablet (30 mg total) by mouth daily. 03/11/20 05/27/29  Sharen Hones, MD  lamoTRIgine (LAMICTAL) 25 MG tablet Take 25 mg by mouth 2 (two) times daily.    [provider]  loratadine (CLARITIN) 10 MG tablet Take 10 mg by mouth daily.    [provider]  Melatonin 3 MG TABS Take 5 mg by mouth at bedtime.    [provider]  montelukast (SINGULAIR) 10 MG tablet Take 10 mg by mouth at bedtime.    [provider]  Multiple Vitamins-Minerals (PRESERVISION AREDS) CAPS Take 1 capsule by mouth in the morning and at bedtime.    [provider]  nitroGLYCERIN (NITROSTAT) 0.4 MG SL tablet Place 0.4 mg under the tongue every 5 (five) minutes as needed for chest pain.    [provider]  pantoprazole (PROTONIX) 40 MG tablet Take 40 mg by mouth daily.    [provider]  sacubitril-valsartan (ENTRESTO) 24-26 MG Take 1 tablet by mouth 2 (two) times daily.    [provider]  spironolactone (ALDACTONE) 25 MG tablet Take 1 tablet (25 mg total) by mouth daily.  06/11/22   Alisa Graff, FNP  vitamin B-12 (CYANOCOBALAMIN) 1000 MCG tablet Take 1,000 mcg by mouth daily.    [provider]    Physical Exam: Vitals:   10/31/22 2130 10/31/22 2200 10/31/22 2259 10/31/22 2330  BP: (!) 102/57 (!) 91/54 (!) 103/59 (!) 102/58  Pulse: 71 74 73 74  Resp: 19 19 (!) 21 20  Temp:   97.9 F (36.6 C)   TempSrc:   Oral   SpO2: 96% 98% 93% 100%  Weight:      Height:       Patient appears to be in no distress,  Alert awake talking coherently, however is scant on details of his symptoms. Cardiovascular exam S1-S2 normal Abdominal exam soft nontender bowel sounds normal Respiratory exam bilateral air entry vesicular Musculoskeletal exam left above-knee amputation from before Data Reviewed:  Results for orders placed or performed during the hospital encounter of 10/31/22 (from the past 24 hour(s))  Basic metabolic panel     Status: Abnormal   Collection Time: 10/31/22  5:22 PM  Result Value Ref Range   Sodium 136 135 - 145 mmol/L   Potassium 4.5 3.5 - 5.1 mmol/L   Chloride 111 98 - 111 mmol/L   CO2 16 (L) 22 - 32 mmol/L   Glucose, Bld 129 (H) 70 - 99 mg/dL   BUN 28 (H) 8 - 23 mg/dL   Creatinine, Ser 1.56 (H) 0.61 - 1.24 mg/dL   Calcium 8.2 (L) 8.9 - 10.3 mg/dL   GFR, Estimated 45 (L) >60 mL/min   Anion gap 9 5 - 15  CBC     Status: Abnormal   Collection Time: 10/31/22  5:22 PM  Result Value Ref Range   WBC 15.1 (H) 4.0 - 10.5 K/uL   RBC 2.38 (L) 4.22 - 5.81 MIL/uL   Hemoglobin 6.3 (L) 13.0 - 17.0 g/dL   HCT 23.3 (L) 39.0 - 52.0 %   MCV 97.9 80.0 - 100.0 fL   MCH 26.5 26.0 - 34.0 pg   MCHC 27.0 (L) 30.0 - 36.0 g/dL   RDW 17.8 (H) 11.5 - 15.5 %   Platelets 411 (H) 150 - 400 K/uL   nRBC  0.0 0.0 - 0.2 %  Troponin I (High Sensitivity)     Status: None   Collection Time: 10/31/22  5:22 PM  Result Value Ref Range   Troponin I (High Sensitivity) 9 <18 ng/L  Prepare RBC (crossmatch)     Status: None   Collection Time: 10/31/22  7:30 PM   Result Value Ref Range   Order Confirmation      ORDER PROCESSED BY BLOOD BANK Performed at [facility], [address] [place], [place] [address]   Troponin I (High Sensitivity)     Status: None   Collection Time: 10/31/22  7:34 PM  Result Value Ref Range   Troponin I (High Sensitivity) 11 <18 ng/L  Hepatic function panel     Status: Abnormal   Collection Time: 10/31/22  7:34 PM  Result Value Ref Range   Total Protein 6.5 6.5 - 8.1 g/dL   Albumin 3.1 (L) 3.5 - 5.0 g/dL   AST 15 15 - 41 U/L   ALT 9 0 - 44 U/L   Alkaline Phosphatase 80 38 - 126 U/L   Total Bilirubin 0.7 0.3 - 1.2 mg/dL   Bilirubin, Direct <0.1 0.0 - 0.2 mg/dL   Indirect Bilirubin NOT CALCULATED 0.3 - 0.9 mg/dL  Type and screen [place]     Status: None (Preliminary result)   Collection Time: 10/31/22  7:34 PM  Result Value Ref Range   ABO/RH(D) B POS    Antibody Screen NEG    Sample Expiration 11/03/2022,2359    Unit Number [id number]    Blood Component Type RED CELLS,LR    Unit division 00    Status of Unit ISSUED    Transfusion Status OK TO TRANSFUSE    Crossmatch Result      Compatible Performed at [facility], [address] [place], [place] [address]   Lactic acid, plasma     Status: None   Collection Time: 10/31/22  7:38 PM  Result Value Ref Range   Lactic Acid, Venous 1.8 0.5 - 1.9 mmol/L  Reticulocytes     Status: Abnormal   Collection Time: 10/31/22 11:45 PM  Result Value Ref Range   Retic Ct Pct 6.4 (H) 0.4 - 3.1 %   RBC. 2.60 (L) 4.22 - 5.81 MIL/uL   Retic Count, Absolute 166.1 19.0 - 186.0 K/uL   Immature Retic Fract 31.3 (H) 2.3 - 15.9 %     Assessment and Plan: * Anemia There are several possible reasons for patient's anemia including chronic blood loss in the setting of guaiac positive stool, chronic kidney disease as well as as yet undiagnosed problem.  Given patient's association with CKD I will check a serum electrophoresis and  also I will order other anemia workup such as ferritin B12 folate reticulocyte count and haptoglobin.  Anemia is severe, patient has received transfusion and at this time no exsanguinating blood loss is suspected.  I will check a posttransfusion CBC and proceed from there  Dyspepsia Stool for H. pylori.  GI evaluation requested as above.  Metabolic acidosis Known CKD, start patient on sodium bicarbonate  On anticoagulant therapy At this time I will hold anticoagulation therapy I have requested evaluation from Dr. Mardi Mainland in the morning for gastroenterology.  PAF (paroxysmal atrial fibrillation) (HCC) Holding anticoagulation as above, continue with telemetry as ordered in ER  Chronic kidney disease Teen nephrology evaluation I will check phos level      Advance Care Planning:   Code Status: Full  Code   Consults: GI, Heme, outpatient renal referral may be sent  Family Communication:   Severity of Illness: The appropriate patient status for this patient is OBSERVATION. Observation status is judged to be reasonable and necessary in order to provide the required intensity of service to ensure the patient's safety. The patient's presenting symptoms, physical exam findings, and initial radiographic and laboratory data in the context of their medical condition is felt to place them at decreased risk for further clinical deterioration. Furthermore, it is anticipated that the patient will be medically stable for discharge from the hospital within 2 midnights of admission.   Author: Gertie Fey, MD 11/01/2022 12:06 AM  For on call review [email or web address].

## 2022-11-01 NOTE — Assessment & Plan Note (Signed)
At this time I will hold anticoagulation therapy I have requested evaluation from Dr. Mardi Mainland in the morning for gastroenterology.

## 2022-11-01 NOTE — ED Notes (Signed)
Messaged DO about pt failing swallow screen. Awaiting orders at this time.

## 2022-11-01 NOTE — Assessment & Plan Note (Signed)
Stool for H. pylori.  GI evaluation requested as above.

## 2022-11-01 NOTE — Assessment & Plan Note (Addendum)
There are several possible reasons for patient's anemia including chronic blood loss in the setting of guaiac positive stool, chronic kidney disease as well as as yet undiagnosed problem.  Given patient's association with CKD I will check a serum electrophoresis and also I will order other anemia workup such as ferritin B12 folate reticulocyte count and haptoglobin.  Anemia is severe, patient has received transfusion and at this time no exsanguinating blood loss is suspected.  I will check a posttransfusion CBC and proceed from there

## 2022-11-01 NOTE — Assessment & Plan Note (Signed)
Teen nephrology evaluation I will check phos level

## 2022-11-01 NOTE — Hospital Course (Addendum)
Shawn Jennings is a 77 y.o. 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  Patient reports that since morning 10/31/21. mild epigastric pain that is "annoying" no radiation no aggravating or relieving factors.  No associated diarrhea or vomiting or fevers.  01/10: ED course - Hgb 6.3, received 1 unit PRBC in ED. Retic ct high at 6.4. CTA GI bleed No appreciable active GI bleeding noted.  01/11: Hgb still low at 6.8, ordered another unit blood. 01/12: Anticipate EGD/colonoscopy Monday, today is Friday.  Patient needs to be off Eliquis 2 days and hemoglobin over 7.  Low BP today, discontinue the rest of his antihypertensives.  Hgb down to 7.5, ordered another unit PRBC.  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  Patient needs to be off Eliquis 2 days and hemoglobin over 7  Essential hypertension HFrEF (heart failure with reduced ejection fraction) (HCC) Coronary artery disease of native artery of native heart with stable angina pectoris (HCC) Ischemic cardiomyopathy PAF (paroxysmal atrial fibrillation) (HCC) On anticoagulant therapy Hyperlipidemia Atherosclerosis 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 (HCC) BP soft on this admission, concerning for hypovolemia d/t anemia  Holding home medications: Initially held Eliquis, Entresto, Lasix; also holding spironolactone, isosorbide, beta-blocker due to low blood pressure  Stage 3a chronic kidney disease (CKD) (HCC) Metabolic acidosis bicarb Recheck BMP I do not think he needs nephrology consult at this time  Other chronic medical conditions, stable: Depression -continue home lamotrigine, duloxetine 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[place]) -consider PT/OT when able Chronic obstructive pulmonary disease, unspecified ([place]) -continue   DVT prophylaxis: none at this time Pertinent IV fluids/nutrition: no continuous IV fluids Central lines / invasive devices: none  Code Status: FULL CODE   Current Admission Status: inpatient  TOC needs / Dispo plan: none at this time, anticipate dc back to previous home environment +/- HH Barriers to discharge / significant pending items: GI eval for bleeding - Anticipate EGD/colonoscopy Monday, today is Friday.  Patient needs to be off Eliquis 2 days and hemoglobin over 7

## 2022-11-02 DIAGNOSIS — J449 Chronic obstructive pulmonary disease, unspecified: Secondary | ICD-10-CM | POA: Diagnosis not present

## 2022-11-02 DIAGNOSIS — D508 Other iron deficiency anemias: Secondary | ICD-10-CM | POA: Diagnosis not present

## 2022-11-02 DIAGNOSIS — D649 Anemia, unspecified: Secondary | ICD-10-CM | POA: Diagnosis not present

## 2022-11-02 DIAGNOSIS — R109 Unspecified abdominal pain: Secondary | ICD-10-CM | POA: Diagnosis not present

## 2022-11-02 DIAGNOSIS — I70219 Atherosclerosis of native arteries of extremities with intermittent claudication, unspecified extremity: Secondary | ICD-10-CM | POA: Diagnosis not present

## 2022-11-02 LAB — BASIC METABOLIC PANEL
Anion gap: 7 (ref 5–15)
BUN: 26 mg/dL — ABNORMAL HIGH (ref 8–23)
CO2: 18 mmol/L — ABNORMAL LOW (ref 22–32)
Calcium: 7.9 mg/dL — ABNORMAL LOW (ref 8.9–10.3)
Chloride: 115 mmol/L — ABNORMAL HIGH (ref 98–111)
Creatinine, Ser: 1.45 mg/dL — ABNORMAL HIGH (ref 0.61–1.24)
GFR, Estimated: 49 mL/min — ABNORMAL LOW (ref >60)
Glucose, Bld: 136 mg/dL — ABNORMAL HIGH (ref 70–99)
Potassium: 4 mmol/L (ref 3.5–5.1)
Sodium: 140 mmol/L (ref 135–145)

## 2022-11-02 LAB — CBC
HCT: 25 % — ABNORMAL LOW (ref 39.0–52.0)
Hemoglobin: 7.7 g/dL — ABNORMAL LOW (ref 13.0–17.0)
MCH: 25.8 pg — ABNORMAL LOW (ref 26.0–34.0)
MCHC: 30.8 g/dL (ref 30.0–36.0)
MCV: 83.6 fL (ref 80.0–100.0)
Platelets: 330 10*3/uL (ref 150–400)
RBC: 2.99 MIL/uL — ABNORMAL LOW (ref 4.22–5.81)
RDW: 21 % — ABNORMAL HIGH (ref 11.5–15.5)
WBC: 13.8 10*3/uL — ABNORMAL HIGH (ref 4.0–10.5)
nRBC: 0 % (ref 0.0–0.2)

## 2022-11-02 LAB — HEMOGLOBIN AND HEMATOCRIT, BLOOD
HCT: 24.8 % — ABNORMAL LOW (ref 39.0–52.0)
Hemoglobin: 7.5 g/dL — ABNORMAL LOW (ref 13.0–17.0)

## 2022-11-02 LAB — HAPTOGLOBIN: Haptoglobin: 267 mg/dL (ref 34–355)

## 2022-11-02 LAB — PREPARE RBC (CROSSMATCH)

## 2022-11-02 MED ORDER — SODIUM CHLORIDE 0.9% IV SOLUTION: Freq: Once | INTRAVENOUS | Status: AC

## 2022-11-02 NOTE — Progress Notes (Signed)
**Note Shawn-Identified via Obfuscation** Speech Language Pathology Treatment: Dysphagia  Patient Details Name: Shawn Jennings MRN: [id number] DOB: [birth year] Today's Date: 11/02/2022 Time: 2841-3244 SLP Time Calculation (min) (ACUTE ONLY): 40 min  Assessment / Plan / Recommendation Clinical Impression  Pt seen for today toleration of diet; trials to upgrade diet. Pt resting in bed, verbal. Required min verbal cues for follow through w/ tasks; full support in positioning upright in bed. Oral care provided -- no bottom Dentition.  On [place] O2 2L; afebrile, WBC elevated but trending down.    Pt appears to present w/ ongoing pharyngeal phase dysphagia in setting of overt s/s of aspiration, dysphagia w/ trials of thin liquids. Throat clearing/coughing (despite following aspiration precautions) were delayed but present and suspected directly related to the sips of thin liquids just consumed. PER AN OUTPATIENT PALLIATIVE CARE NOTE, PT HAS A H/O ASPIRATION PNEUMONIA LISTED IN THE NOTE.   In setting of pt's illness/hospitalization, baseline/CHRONIC medical issues including Pulmonary decline and aspiration pneumonia, and current weakness/deconditioning along w/ overt s/s of dysphagia, pt appears at increased risk for aspiration/aspiration pneumonia and is recommended to remain on a dysphagia diet w/ Nectar consistency liquids until a MBSS can be performed.   OF NOTE: SEE GI NOTE TODAY INDICATING PATIENT "needs blood with a severe gi bleed" -- RECOMMEND HOLDING ON OBJECTIVE SWALLOW ASSESSMENT UNTIL THIS IS RESOLVED/CLEARED BY GI TO HAVE A MBS.  MONITOR PT'S TOLERATION OF ORAL DIET W/ NSG.    During po trials of Nectar liquids and foods, no overt coughing/throat clearing, decline in vocal quality, nor sustained change in respiratory presentation during/post trials. Rest Breaks and time for Belching and to calm RR when any WOB noted from the exertion of self-feeding, and po's. OVERT s/s of swallowing deficits were noted w/ trials of Thin liquids despite  aspiration precautions.  Oral phase appeared grossly [facility] w/ timely bolus management, mashing, and control of bolus propulsion for A-P transfer for swallowing. Oral clearing achieved w/ all trial consistencies given min time when needed.    Recommend continue a Dysphagia level 2 w/ moistened foods; Nectar liquids. Recommend general aspiration precautions, Pills WHOLE vs Crushed in Puree for safer, easier swallowing. Tray setup and support w/ meals; positioning support d/t weakness. Less distractions and talking at meals. Rest Breaks during meals for conservation of energy.  Education provided to pt on above including the diet; food consistencies and easy to eat options; general aspiration precautions; need for Rest Breaks during meals to calm Breathing; GERD precautions; pills in puree; oral care; and need for a dysphagia diet at this time d/t risk for aspiration/aspiration pneumonia. A MBSS will be scheduled when appropriate next week to further assess the swallow function.    ST services will monitor pt's status next 2-3 days. NSG/MD updated also.        HPI HPI: Pt is a 77 y.o. male  w/ past medical history of CAD, heart failure, CKD, CABG, COPD, oxygen supplementation at night 2L, etoh, depression, hypertension, hyperlipidemia, peripheral artery disease, paroxysmal atrial fibrillation on Eliquis, diabetes, left leg amputation presents with epigastric pain for the past 4 days.  Denies nausea or vomiting, denies GI bleeding.  Some exertional shortness of breath and generalized weakness.     Of note he has had upper GI bleeding back in 2022 which showed gastropathy.   CXR: No active cardiopulmonary disease. Streaky atelectasis or scarring  at the bases.  [facility] resides at a [facility].   OF NOTE: receives Palliative Care services baseline.  SLP Plan  Continue with current plan of care;MBS (mbs next week)      Recommendations for follow up therapy are one component of a multi-disciplinary  discharge planning process, led by the attending physician.  Recommendations may be updated based on patient status, additional functional criteria and insurance authorization.    Recommendations  Diet recommendations: Dysphagia 2 (fine chop);Nectar-thick liquid Liquids provided via: Cup Medication Administration: Whole meds with puree (vs need to Crush) Supervision: Patient able to self feed;Intermittent supervision to cue for compensatory strategies Compensations: Minimize environmental distractions;Slow rate;Small sips/bites;Lingual sweep for clearance of pocketing;Follow solids with liquid Postural Changes and/or Swallow Maneuvers: Out of bed for meals;Seated upright 90 degrees;Upright 30-60 min after meal                General recommendations:  (Dietician f/u; [facility] f/u for Shawn [facility]) Oral Care Recommendations: Oral care BID;Oral care before and after PO;Staff/trained caregiver to provide oral care (denture care) Follow Up Recommendations: Skilled nursing-short term rehab (<3 hours/day) Assistance recommended at discharge: Intermittent Supervision/Assistance SLP Visit Diagnosis: Dysphagia, oropharyngeal phase (R13.12) Plan: Continue with current plan of care;MBS (mbs next week)            Shawn Jennings, [place], [place]; [place] [phone] (ascom) Shawn Jennings  11/02/2022, 6:08 PM

## 2022-11-02 NOTE — Progress Notes (Signed)
   11/02/22 1214  Assess: MEWS Score  Temp 98 F (36.7 C)  BP (!) 77/52  MAP (mmHg) (!) 62  Pulse Rate 78  Resp 15  SpO2 98 %  O2 Device Room Air  Assess: if the MEWS score is Yellow or Red  Were vital signs taken at a resting state? Yes  Focused Assessment Change from prior assessment (see assessment flowsheet)  Does the patient meet 2 or more of the SIRS criteria? No  MEWS guidelines implemented *See Row Information* Yes  Treat  Pain Scale 0-10  Pain Score 0  Take Vital Signs  Increase Vital Sign Frequency  Yellow: Q 2hr X 2 then Q 4hr X 2, if remains yellow, continue Q 4hrs  Escalate  MEWS: Escalate Yellow: discuss with charge nurse/RN and consider discussing with provider and RRT  Notify: Charge Nurse/RN  Name of Charge Nurse/RN Notified Siri Cole  Date Charge Nurse/RN Notified 11/02/22  Time Charge Nurse/RN Notified 1221  Provider Notification  Provider Name/Title Dr Sheppard Coil  Date Provider Notified 11/02/22  Time Provider Notified 1219  Method of Notification  (text)  Notification Reason Change in status (BP  lowe)  Provider response Other (Comment) (she will be discontiuing some meds, patient is asymtomatic contune to evaluate no bolus for now)  Date of Provider Response 11/02/22  Time of Provider Response 1228

## 2022-11-02 NOTE — Progress Notes (Addendum)
PROGRESS NOTE    Shawn Jennings   IWP:[id number] DOB: 1946-01-11  DOA: 10/31/2022 Date of Service: 11/02/22 PCP: Merryl Hacker, No     Brief Narrative / Hospital Course:  Shawn Jennings is a 77 y.o. 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  Patient reports that since morning 10/31/21. mild epigastric pain that is "annoying" no radiation no aggravating or relieving factors.  No associated diarrhea or vomiting or fevers.  01/10: ED course - Hgb 6.3, received 1 unit PRBC in ED. Retic ct high at 6.4. CTA GI bleed No appreciable active GI bleeding noted.  01/11: Hgb still low at 6.8, ordered another unit blood. 01/12: Anticipate EGD/colonoscopy Monday, today is Friday.  Patient needs to be off Eliquis 2 days and hemoglobin over 7.  Low BP today, discontinue the rest of his antihypertensives.  Hgb down to 7.5, ordered another unit PRBC.  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  Patient needs to be off Eliquis 2 days and hemoglobin over 7  Essential hypertension HFrEF (heart failure with reduced ejection fraction) (HCC) Coronary artery disease of native artery of native heart with stable angina pectoris (HCC) Ischemic cardiomyopathy PAF (paroxysmal atrial fibrillation) (HCC) On anticoagulant therapy Hyperlipidemia Atherosclerosis 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 (HCC) BP soft on this admission, concerning for hypovolemia d/t anemia  Holding home medications: Initially held Eliquis, Entresto, Lasix; also holding spironolactone, isosorbide, beta-blocker due to low blood pressure  Stage 3a chronic kidney disease (CKD) (HCC) Metabolic acidosis bicarb Recheck BMP I do not think he needs nephrology consult at this time  Other chronic medical conditions, stable: Depression -continue home lamotrigine, duloxetine 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[place]) -consider PT/OT when able Chronic obstructive pulmonary disease, unspecified ([place]) -continue   DVT prophylaxis: none at this time Pertinent IV fluids/nutrition: no continuous IV fluids Central lines / invasive devices: none  Code Status: FULL CODE   Current Admission Status: inpatient  TOC needs / Dispo plan: none at this time, anticipate dc back to previous home environment +/- HH Barriers to discharge / significant pending items: GI eval for bleeding - Anticipate EGD/colonoscopy Monday, today is Friday.  Patient needs to be off Eliquis 2 days and hemoglobin over 7     ADDENDUM 11/02/22 3:55 PM  Regarding  consent for blood administration, per protocol if patient is not alert and oriented x 4 they cannot sign own consent.  Attempts have  been made to reach reasonably available surrogate decision makers, emergency contact is not answering, patient does not have legal guardian.  Per [place] statute below, attending physician may consent on patient's behalf - I have discussed the situation with Dr. Vicente Males and he is in agreement. Patient is assenting and I believe has capacity to understand his options in this case even if he is not meeting strict protocols for consent paperwork. May proceed with blood administration as ordered. See: [place] 90. Medicine and Allied Occupations  90-21.13. Informed consent to health care treatment or procedure. In brief summary, this statute outlines that the following persons, in order indicated, are authorized to consent to medical treatment on behalf of the patient who does not demonstrate capacity to do so: Legally assigned guardian appointed by the court > healthcare agent appointed by legal healthcare power of attorney > healthcare agent appointed by the patient > legal spouse > majority of the patient's reasonably available parents and children who are at least 7 years of age > individual who has an established relationship with the patient, who is acting in good faith on behalf of the patient, and who can reliably convey the patient's wishes > attending physician with confirmation by another physician of the patient's condition and the necessity for treatment (unless in urgent/emergent situation)          Subjective / Brief ROS:  Patient reports feeling tired, a bit lightheaded today Denies CP/SOB.  Pain controlled.  Denies new weakness.  Reports no concerns w/ urination/defecation.   Family Communication: pt declined call to family/support person(s)    Objective Findings:  Vitals:   11/02/22 0627 11/02/22 0754 11/02/22 1214 11/02/22 1428  BP: (!) 92/49 (!) 92/52 (!) 77/52 (!) 91/54  Pulse: 67 78 78 71  Resp: '19 16 15   '$ Temp: 97.9 F (36.6 C) 98.3 F (36.8  C) 98 F (36.7 C)   TempSrc:      SpO2: 96% 97% 98% 97%  Weight:      Height:        Intake/Output Summary (Last 24 hours) at 11/02/2022 1521 Last data filed at 11/02/2022 0700 Gross per 24 hour  Intake 706.65 ml  Output 800 ml  Net -93.35 ml   Filed Weights   10/31/22 1720  Weight: 73 kg    Examination:  Physical Exam Constitutional:      General: He is not in acute distress.    Appearance: He is ill-appearing.  Cardiovascular:     Rate and Rhythm: Normal rate and regular rhythm.     Heart sounds: Murmur heard.     Systolic murmur is present.  Pulmonary:     Effort: Pulmonary effort is normal. No respiratory distress.     Breath sounds: Normal breath sounds.  Abdominal:     Palpations: Abdomen is soft.  Musculoskeletal:     Right lower leg: No edema.  Skin:    General: Skin is warm and dry.  Neurological:     General: No focal deficit present.     Mental Status: He is alert.  Psychiatric:        Mood and Affect: Mood normal.        Behavior: Behavior normal.          Scheduled Medications:   sodium chloride   Intravenous Once  atorvastatin  80 mg Oral QHS   cholecalciferol  1,000 Units Oral Daily   cyanocobalamin  1,000 mcg Oral Daily   DULoxetine  30 mg Oral BID   ferrous sulfate  325 mg Oral QODAY   lamoTRIgine  25 mg Oral BID   loratadine  10 mg Oral Daily   melatonin  5 mg Oral QHS   montelukast  10 mg Oral QHS   polyethylene glycol-electrolytes  2,000 mL Oral Once   sodium bicarbonate  650 mg Oral TID   sodium chloride flush  3 mL Intravenous Q12H    Continuous Infusions:  pantoprazole 8 mg/hr (11/02/22 0754)    PRN Medications:  acetaminophen, albuterol, alum & mag hydroxide-simeth, hydrOXYzine, nitroGLYCERIN, oxyCODONE  Antimicrobials from admission:  Anti-infectives (From admission, onward)    Start     Dose/Rate Route Frequency Ordered Stop   11/01/22 2300  azithromycin (ZITHROMAX) 500 mg in sodium chloride 0.9 % 250 mL IVPB   Status:  Discontinued        500 mg 250 mL/hr over 60 Minutes Intravenous Every 24 hours 11/01/22 0107 11/01/22 0748   11/01/22 2200  cefTRIAXone (ROCEPHIN) 1 g in sodium chloride 0.9 % 100 mL IVPB  Status:  Discontinued        1 g 200 mL/hr over 30 Minutes Intravenous Every 24 hours 11/01/22 0107 11/01/22 0748   11/01/22 0100  cefTRIAXone (ROCEPHIN) 1 g in sodium chloride 0.9 % 100 mL IVPB  Status:  Discontinued        1 g 200 mL/hr over 30 Minutes Intravenous Every 24 hours 11/01/22 0056 11/01/22 0107   11/01/22 0100  azithromycin (ZITHROMAX) 500 mg in sodium chloride 0.9 % 250 mL IVPB  Status:  Discontinued        500 mg 250 mL/hr over 60 Minutes Intravenous Every 24 hours 11/01/22 0056 11/01/22 0107   10/31/22 2115  cefTRIAXone (ROCEPHIN) 1 g in sodium chloride 0.9 % 100 mL IVPB        1 g 200 mL/hr over 30 Minutes Intravenous  Once 10/31/22 2111 10/31/22 2333   10/31/22 2115  azithromycin (ZITHROMAX) 500 mg in sodium chloride 0.9 % 250 mL IVPB        500 mg 250 mL/hr over 60 Minutes Intravenous  Once 10/31/22 2111 11/01/22 0055           Data Reviewed:  I have personally reviewed the following...  CBC: Recent Labs  Lab 10/31/22 1722 10/31/22 2355 11/01/22 0450 11/01/22 0750 11/01/22 2111 11/02/22 0734 11/02/22 1258  WBC 15.1* 16.2* 17.5* 16.1*  --  13.8*  --   HGB 6.3* 6.5* 6.8* 6.4* 8.9* 7.7* 7.5*  HCT 23.3* 21.8* 23.2* 21.8* 29.5* 25.0* 24.8*  MCV 97.9 86.2 85.9 86.2  --  83.6  --   PLT 411* 332 335 332  --  330  --    Basic Metabolic Panel: Recent Labs  Lab 10/31/22 1722 11/01/22 0450 11/01/22 0750 11/02/22 0734  NA 136  --  139 140  K 4.5  --  4.1 4.0  CL 111  --  114* 115*  CO2 16*  --  19* 18*  GLUCOSE 129*  --  123* 136*  BUN 28*  --  26* 26*  CREATININE 1.56*  --  1.51* 1.45*  CALCIUM 8.2*  --  7.8* 7.9*  PHOS  --  3.4  --   --    GFR: Estimated Creatinine Clearance: 43.4 mL/min (A) (by C-G formula based on SCr  of 1.45 mg/dL (H)). Liver  Function Tests: Recent Labs  Lab 10/31/22 1934  AST 15  ALT 9  ALKPHOS 80  BILITOT 0.7  PROT 6.5  ALBUMIN 3.1*   Recent Labs  Lab 10/31/22 2344  LIPASE 90*   No results for input(s): "AMMONIA" in the last 168 hours. Coagulation Profile: Recent Labs  Lab 11/01/22 0450  INR 1.5*   Cardiac Enzymes: No results for input(s): "CKTOTAL", "CKMB", "CKMBINDEX", "TROPONINI" in the last 168 hours. BNP (last 3 results) No results for input(s): "PROBNP" in the last 8760 hours. HbA1C: No results for input(s): "HGBA1C" in the last 72 hours. CBG: No results for input(s): "GLUCAP" in the last 168 hours. Lipid Profile: No results for input(s): "CHOL", "HDL", "LDLCALC", "TRIG", "CHOLHDL", "LDLDIRECT" in the last 72 hours. Thyroid Function Tests: No results for input(s): "TSH", "T4TOTAL", "FREET4", "T3FREE", "THYROIDAB" in the last 72 hours. Anemia Panel: Recent Labs    10/31/22 2345  VITAMINB12 672  FOLATE 8.7  FERRITIN 17*  TIBC 302  IRON 59  RETICCTPCT 6.4*   Most Recent Urinalysis On File:     Component Value Date/Time   COLORURINE STRAW (A) 07/15/2021 1839   APPEARANCEUR CLEAR (A) 07/15/2021 1839   LABSPEC 1.014 07/15/2021 1839   PHURINE 5.0 07/15/2021 1839   GLUCOSEU 150 (A) 07/15/2021 1839   HGBUR NEGATIVE 07/15/2021 1839   BILIRUBINUR NEGATIVE 07/15/2021 1839   KETONESUR NEGATIVE 07/15/2021 1839   PROTEINUR NEGATIVE 07/15/2021 1839   NITRITE NEGATIVE 07/15/2021 1839   LEUKOCYTESUR NEGATIVE 07/15/2021 1839   Sepsis Labs: '@LABRCNTIP'$ (procalcitonin:4,lacticidven:4) Microbiology: No results found for this or any previous visit (from the past 240 hour(s)).    Radiology Studies last 3 days: CT ANGIO GI BLEED  Result Date: 10/31/2022 CLINICAL DATA:  Upper GI bleed with chest pain. EXAM: CTA ABDOMEN AND PELVIS WITHOUT AND WITH CONTRAST TECHNIQUE: Multidetector CT imaging of the abdomen and pelvis was performed using the standard protocol during bolus administration of  intravenous contrast. Multiplanar reconstructed images and MIPs were obtained and reviewed to evaluate the vascular anatomy. RADIATION DOSE REDUCTION: This exam was performed according to the departmental dose-optimization program which includes automated exposure control, adjustment of the mA and/or kV according to patient size and/or use of iterative reconstruction technique. CONTRAST:  72m OMNIPAQUE IOHEXOL 350 MG/ML SOLN COMPARISON:  CTs of abdomen and pelvis with contrast 07/17/2022 and 07/15/2021. FINDINGS: VASCULAR Aorta: There is extensive calcific plaque in the aortic wall without penetrating ulcer, critical aortic stenosis or dissection. Maximum aortic caliber 2.6 cm.Recommend follow-up every 5 years. Reference: J Am Coll Radiol [id number];[id number] Celiac: There are ostial calcifications causing a 50% vessel origin stenosis. There are additional nonstenosing wall calcifications more distally. There are patchy calcific plaques in the splenic artery and hepatic arteries. SMA: There moderate to heavy patchy wall calcifications but no more than 40% luminal stenosis. There is however, a high-grade proximal mixed plaque stenosis in the dominant right-sided trifurcation division of the SMA, with reconstitution distally as it extends to the left. Renals: Both renal arteries are single. There are patchy calcific plaques on the left-greater-than-right. There is up to 50-60% irregular stenosis of the left renal artery, right renal artery with up to 40% origin stenosis and otherwise well patent, with renovascular calcifications at both renal hila, more extensive on the left. IMA: Occluded. Inflow: Heavily calcified. 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 Slightly distal to this there is a 75% calcific stenosis focally  just proximal the vessel bifurcation and no other focal stenosis. On the left there is a 60% common iliac arterial focal stenosis at the  bifurcation. There is moderate irregular stenosis along both internal iliac arteries. Right external iliac artery demonstrating irregular up to 50% stenosis due to calcific plaques. Left external iliac artery occludes for a short-segment distal to its origin, then reconstitutes for the rest of its length but with only about 20% patency generally. Proximal Outflow: There is at least 80% calcific stenosis in the proximal right common femoral artery. The proximal deep and circumflex femoral arteries are heavily calcified and at least moderately stenotic. The proximal right SFA is occluded. There is a bypass graft from the right common femoral artery to the lowest point of the study near the base of the scrotum, which is patent. The graft arises distal to the proximal common femoral artery stenosis. On the left, the deep and circumflex femoral arteries are heavily calcified but at least partially patent. There is occlusion of the left common femoral artery, occlusion of the native left SFA, and an occluded left femoral bypass graft arising from the proximal common femoral artery and itself is proximally collapsed. There are multiple surgical clips in both inguinal areas. Veins: Patent as far as visualized. The left iliac veins are smaller than the right especially the left common iliac vein, which is compressed against the spine by the aorta and proximal left common iliac artery. Review of the MIP images confirms the above findings. NON-VASCULAR Lower chest: The cardiac size is normal. There are three-vessel coronary artery calcifications, aortic valve TAVR, and heavy calcification in the inferolateral mitral ring. The intraventricular blood pool is low in density consistent with anemia. There is interval worsening of bilateral lower lobe bronchial thickening, and on the right, interval worsening multifocal segmental and subsegmental bronchial/bronchiolar plugging in the middle and lower lobes, with patchy tree-in-bud  interstitial changes also worsened. There is associated hazy airspace disease in the right middle lobe, posterior basal consolidation right lower lobe. Findings consistent with bronchopneumonia and probable aspiration pneumonitis. Please correlate clinically and consider aspiration precautions. Lung bases elsewhere are clear. Hepatobiliary: Mildly steatotic liver without mass. Surgically absent gallbladder without biliary dilatation. Pancreas: No abnormality. Spleen: No abnormality.  No splenomegaly. Adrenals/Urinary Tract: There is no adrenal mass. There are a few cortical hypodensities in both kidneys which are too small to characterize. These are unchanged. Again noted is a 1.7 cm complex septated cystic lesion in the superior pole of the right kidney. Although unchanged, MRI follow-up is recommended for further evaluation. There are renovascular calcifications at the renal hila but no urolithiasis is seen, no hydronephrosis or urinary obstruction. The bladder is unremarkable. 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 Endoscopy may be indicated but this seems unchanged. There is a debris-filled descending duodenal diverticulum again measuring 2.6 cm. The small and large bowel show no acute abnormality. The appendix is normal caliber. There is chronic dilatation of a left lower quadrant postsurgical small bowel segment, without associated obstruction. There is moderate retained stool ascending and transverse colon with terminal ileal fecal backup. There are scattered uncomplicated sigmoid diverticula. No contrast extravasation is seen into bowel. Lymphatic: No adenopathy is seen. Reproductive: There is a normal size prostate with dystrophic calcifications centrally. Both testicles are in the scrotal sac. There are epididymal tail calcifications. No hydroceles. Other: There is no free air, free fluid, free hemorrhage or incarcerated hernia.  Musculoskeletal: There is osteopenia  with degenerative changes of the spine. There is mild chronic anterior wedging of the T12 vertebral body. L5 is transitional. There are chronic healed left pubic rami fractures. IMPRESSION: 1. No appreciable active GI bleeding. 2. Extensive aortic and branch vessel atherosclerosis. 3. 2.6 cm abdominal aortic caliber. Imaging follow-up every 5 years recommended. 4. 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 5. High-grade proximal mixed plaque stenosis in the dominant right-sided trifurcation division of the SMA, with reconstitution distally. 6. Bilateral renal artery stenoses, left greater than right. 7. Left external iliac artery proximal occlusion with about 20% reconstitution for the rest of its length. 8. Right common femoral artery bypass graft is patent as far as seen. 9. Bilateral native SFA occlusion, occluded left common femoral artery, and occluded left femoral bypass graft. 10. Interval worsening of bilateral lower lobe bronchial thickening and right middle and lower lobe segmental and subsegmental bronchial/bronchiolar plugging with associated tree-in-bud interstitial changes and increased airspace disease in the right middle lobe and posterior basal right lower lobe. Findings consistent with bronchopneumonia with high likelihood of aspiration etiology. Please correlate clinically and consider aspiration precautions. 11. 1.7 cm complex septated cystic lesion in the superior pole of the right kidney. Nonemergent MRI follow-up is recommended. 12. Constipation with diverticulosis and distal ileal fecal back up. 13. Remaining findings discussed above. Aortic Atherosclerosis (ICD10-I70.0). Electronically Signed   By: Telford Nab M.D.   On: 10/31/2022 20:56   DG Chest 2 View  Result Date: 10/31/2022 CLINICAL DATA:  Chest pain EXAM: CHEST - 2 VIEW COMPARISON:  07/17/2022 FINDINGS: Post sternotomy changes. No acute airspace disease or effusion. Streaky atelectasis or scarring  at the posterior lung base. Valve prosthesis. Stable cardiomediastinal silhouette with aortic atherosclerosis. No pneumothorax IMPRESSION: No active cardiopulmonary disease. Streaky atelectasis or scarring at the bases. Electronically Signed   By: Donavan Foil M.D.   On: 10/31/2022 17:59             LOS: 1 day      Emeterio Reeve, DO [facility] 11/02/2022, 3:21 PM    Dictation software may have been used to generate the above note. Typos may occur and escape review in typed/dictated notes. Please contact Dr Sheppard Coil directly for clarity if needed.  Staff may message me via secure chat in [place]  but this may not receive an immediate response,  please page me for urgent matters!  If 7PM-7AM, please contact night coverage [email or web address]

## 2022-11-02 NOTE — Progress Notes (Signed)
Shawn Jennings , MD [address] [place], [place], [place], [id number], [place], [place], Shawn [place], [address] Phone: [phone]  Fax: [phone]   Shawn Jennings is being followed for iron deficiency anemia  Day 1 of follow up   Subjective: Denies any bleeding    Objective: Vital signs in last 24 hours: Vitals:   11/01/22 2219 11/01/22 2354 11/02/22 0627 11/02/22 0754  BP:  109/60 (!) 92/49 (!) 92/52  Pulse:  83 67 78  Resp:  '20 19 16  '$ Temp:  98.6 F (37 C) 97.9 F (36.6 C) 98.3 F (36.8 C)  TempSrc:      SpO2: 97% 94% 96% 97%  Weight:      Height:       Weight change:   Intake/Output Summary (Last 24 hours) at 11/02/2022 3267 Last data filed at 11/02/2022 0700 Gross per 24 hour  Intake 1066.65 ml  Output 800 ml  Net 266.65 ml     Exam: Heart:: Regular rate and rhythm or S1S2 present Lungs: normal and clear to auscultation Abdomen: soft, nontender, normal bowel sounds   Lab Results: '@LABTEST2'$ @ Micro Results: No results found for this or any previous visit (from the past 240 hour(s)). Studies/Results: CT ANGIO GI BLEED  Result Date: 10/31/2022 CLINICAL DATA:  Upper GI bleed with chest pain. EXAM: CTA ABDOMEN AND PELVIS WITHOUT AND WITH CONTRAST TECHNIQUE: Multidetector CT imaging of the abdomen and pelvis was performed using the standard protocol during bolus administration of intravenous contrast. Multiplanar reconstructed images and MIPs were obtained and reviewed to evaluate the vascular anatomy. RADIATION DOSE REDUCTION: This exam was performed according to the departmental dose-optimization program which includes automated exposure control, adjustment of the mA and/or kV according to patient size and/or use of iterative reconstruction technique. CONTRAST:  67m OMNIPAQUE IOHEXOL 350 MG/ML SOLN COMPARISON:  CTs of abdomen and pelvis with contrast 07/17/2022 and 07/15/2021. FINDINGS: VASCULAR Aorta: There is extensive calcific plaque in the  aortic wall without penetrating ulcer, critical aortic stenosis or dissection. Maximum aortic caliber 2.6 cm.Recommend follow-up every 5 years. Reference: J Am Coll Radiol [id number];[id number] Celiac: There are ostial calcifications causing a 50% vessel origin stenosis. There are additional nonstenosing wall calcifications more distally. There are patchy calcific plaques in the splenic artery and hepatic arteries. SMA: There moderate to heavy patchy wall calcifications but no more than 40% luminal stenosis. There is however, a high-grade proximal mixed plaque stenosis in the dominant right-sided trifurcation division of the SMA, with reconstitution distally as it extends to the left. Renals: Both renal arteries are single. There are patchy calcific plaques on the left-greater-than-right. There is up to 50-60% irregular stenosis of the left renal artery, right renal artery with up to 40% origin stenosis and otherwise well patent, with renovascular calcifications at both renal hila, more extensive on the left. IMA: Occluded. Inflow: Heavily calcified. 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 Slightly distal to this there is a 75% calcific stenosis focally just proximal the vessel bifurcation and no other focal stenosis. On the left there is a 60% common iliac arterial focal stenosis at the bifurcation. There is moderate irregular stenosis along both internal iliac arteries. Right external iliac artery demonstrating irregular up to 50% stenosis due to calcific plaques. Left external iliac artery occludes for a short-segment distal to its origin, then reconstitutes for the rest of its length but with only about 20% patency generally. Proximal Outflow:  There is at least 80% calcific stenosis in the proximal right common femoral artery. The proximal deep and circumflex femoral arteries are heavily calcified and at least moderately stenotic. The proximal right SFA is  occluded. There is a bypass graft from the right common femoral artery to the lowest point of the study near the base of the scrotum, which is patent. The graft arises distal to the proximal common femoral artery stenosis. On the left, the deep and circumflex femoral arteries are heavily calcified but at least partially patent. There is occlusion of the left common femoral artery, occlusion of the native left SFA, and an occluded left femoral bypass graft arising from the proximal common femoral artery and itself is proximally collapsed. There are multiple surgical clips in both inguinal areas. Veins: Patent as far as visualized. The left iliac veins are smaller than the right especially the left common iliac vein, which is compressed against the spine by the aorta and proximal left common iliac artery. Review of the MIP images confirms the above findings. NON-VASCULAR Lower chest: The cardiac size is normal. There are three-vessel coronary artery calcifications, aortic valve TAVR, and heavy calcification in the inferolateral mitral ring. The intraventricular blood pool is low in density consistent with anemia. There is interval worsening of bilateral lower lobe bronchial thickening, and on the right, interval worsening multifocal segmental and subsegmental bronchial/bronchiolar plugging in the middle and lower lobes, with patchy tree-in-bud interstitial changes also worsened. There is associated hazy airspace disease in the right middle lobe, posterior basal consolidation right lower lobe. Findings consistent with bronchopneumonia and probable aspiration pneumonitis. Please correlate clinically and consider aspiration precautions. Lung bases elsewhere are clear. Hepatobiliary: Mildly steatotic liver without mass. Surgically absent gallbladder without biliary dilatation. Pancreas: No abnormality. Spleen: No abnormality.  No splenomegaly. Adrenals/Urinary Tract: There is no adrenal mass. There are a few cortical  hypodensities in both kidneys which are too small to characterize. These are unchanged. Again noted is a 1.7 cm complex septated cystic lesion in the superior pole of the right kidney. Although unchanged, MRI follow-up is recommended for further evaluation. There are renovascular calcifications at the renal hila but no urolithiasis is seen, no hydronephrosis or urinary obstruction. The bladder is unremarkable. 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 Endoscopy may be indicated but this seems unchanged. There is a debris-filled descending duodenal diverticulum again measuring 2.6 cm. The small and large bowel show no acute abnormality. The appendix is normal caliber. There is chronic dilatation of a left lower quadrant postsurgical small bowel segment, without associated obstruction. There is moderate retained stool ascending and transverse colon with terminal ileal fecal backup. There are scattered uncomplicated sigmoid diverticula. No contrast extravasation is seen into bowel. Lymphatic: No adenopathy is seen. Reproductive: There is a normal size prostate with dystrophic calcifications centrally. Both testicles are in the scrotal sac. There are epididymal tail calcifications. No hydroceles. Other: There is no free air, free fluid, free hemorrhage or incarcerated hernia. Musculoskeletal: There is osteopenia with degenerative changes of the spine. There is mild chronic anterior wedging of the T12 vertebral body. L5 is transitional. There are chronic healed left pubic rami fractures. IMPRESSION: 1. No appreciable active GI bleeding. 2. Extensive aortic and branch vessel atherosclerosis. 3. 2.6 cm abdominal aortic caliber. Imaging follow-up every 5 years recommended. 4. 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 5. High-grade proximal mixed plaque stenosis in the dominant right-sided trifurcation division of the SMA, with reconstitution distally. 6.  Bilateral renal artery stenoses, left greater than right. 7. Left external iliac artery proximal occlusion with about 20% reconstitution for the rest of its length. 8. Right common femoral artery bypass graft is patent as far as seen. 9. Bilateral native SFA occlusion, occluded left common femoral artery, and occluded left femoral bypass graft. 10. Interval worsening of bilateral lower lobe bronchial thickening and right middle and lower lobe segmental and subsegmental bronchial/bronchiolar plugging with associated tree-in-bud interstitial changes and increased airspace disease in the right middle lobe and posterior basal right lower lobe. Findings consistent with bronchopneumonia with high likelihood of aspiration etiology. Please correlate clinically and consider aspiration precautions. 11. 1.7 cm complex septated cystic lesion in the superior pole of the right kidney. Nonemergent MRI follow-up is recommended. 12. Constipation with diverticulosis and distal ileal fecal back up. 13. Remaining findings discussed above. Aortic Atherosclerosis (ICD10-I70.0). Electronically Signed   By: Telford Nab M.D.   On: 10/31/2022 20:56   DG Chest 2 View  Result Date: 10/31/2022 CLINICAL DATA:  Chest pain EXAM: CHEST - 2 VIEW COMPARISON:  07/17/2022 FINDINGS: Post sternotomy changes. No acute airspace disease or effusion. Streaky atelectasis or scarring at the posterior lung base. Valve prosthesis. Stable cardiomediastinal silhouette with aortic atherosclerosis. No pneumothorax IMPRESSION: No active cardiopulmonary disease. Streaky atelectasis or scarring at the bases. Electronically Signed   By: Donavan Foil M.D.   On: 10/31/2022 17:59   Medications: I have reviewed the patient's current medications. Scheduled Meds:  atorvastatin  80 mg Oral QHS   carvedilol  3.125 mg Oral BID   cholecalciferol  1,000 Units Oral Daily   cyanocobalamin  1,000 mcg Oral Daily   DULoxetine  30 mg Oral BID   ferrous sulfate  325 mg  Oral QODAY   isosorbide mononitrate  15 mg Oral Daily   lamoTRIgine  25 mg Oral BID   loratadine  10 mg Oral Daily   melatonin  5 mg Oral QHS   montelukast  10 mg Oral QHS   polyethylene glycol-electrolytes  2,000 mL Oral Once   sodium bicarbonate  650 mg Oral TID   sodium chloride flush  3 mL Intravenous Q12H   spironolactone  12.5 mg Oral Daily   Continuous Infusions:  pantoprazole 8 mg/hr (11/02/22 0754)   PRN Meds:.acetaminophen, albuterol, alum & mag hydroxide-simeth, hydrOXYzine, nitroGLYCERIN, oxyCODONE   Assessment: Principal Problem:   Anemia Active Problems:   Stage 3a chronic kidney disease (CKD) (HCC)   Essential hypertension   Coronary artery disease of native artery of native heart with stable angina pectoris (HCC)   Ischemic cardiomyopathy   PAF (paroxysmal atrial fibrillation) (HCC)   On anticoagulant therapy   Depression   GERD (gastroesophageal reflux disease)   Hyperlipidemia   Abdominal pain   Acute blood loss anemia   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 y/o male with history of peripheral arterial disease left above-knee amputation present to the ER with epigastric pain found to have a 6 g drop in hemoglobin from baseline of 12.7 g 3 months back.  Iron low.  CT angiogram show no active bleeding but incidental findings of cyst in the kidney and significant constipation noted.  Stenosis of various vessels were also noted.   Plan  1.  Iron deficiency anemia will require EGD and colonoscopy once hemoglobin over 7 g and he is off Eliquis for 2 days, can plan on Monday.  If negative will require capsule study of the small bowel 2.   Follow-up cystic lesion in the kidney may require  MRI. 3.  IV iron 4.  Monitor CBC and transfuse as needed       LOS: 1 day   Shawn Bellows, MD 11/02/2022, 8:19 AM

## 2022-11-02 NOTE — Progress Notes (Signed)
  I have discussed with Dr Sheppard Coil and we spoke and agreed that the patient needs blood with a severe gi bleed  Dr Damaris Hippo

## 2022-11-02 NOTE — Progress Notes (Signed)
Called emergency contact [place]   [phone] file left message to return call

## 2022-11-03 DIAGNOSIS — R109 Unspecified abdominal pain: Secondary | ICD-10-CM | POA: Diagnosis not present

## 2022-11-03 DIAGNOSIS — D649 Anemia, unspecified: Secondary | ICD-10-CM | POA: Diagnosis not present

## 2022-11-03 DIAGNOSIS — J449 Chronic obstructive pulmonary disease, unspecified: Secondary | ICD-10-CM | POA: Diagnosis not present

## 2022-11-03 DIAGNOSIS — I70219 Atherosclerosis of native arteries of extremities with intermittent claudication, unspecified extremity: Secondary | ICD-10-CM | POA: Diagnosis not present

## 2022-11-03 LAB — TYPE AND SCREEN
ABO/RH(D): B POS
Antibody Screen: NEGATIVE
Unit division: 0
Unit division: 0
Unit division: 0

## 2022-11-03 LAB — CBC
HCT: 27.4 % — ABNORMAL LOW (ref 39.0–52.0)
Hemoglobin: 8.4 g/dL — ABNORMAL LOW (ref 13.0–17.0)
MCH: 26 pg (ref 26.0–34.0)
MCHC: 30.7 g/dL (ref 30.0–36.0)
MCV: 84.8 fL (ref 80.0–100.0)
Platelets: 295 10*3/uL (ref 150–400)
RBC: 3.23 MIL/uL — ABNORMAL LOW (ref 4.22–5.81)
RDW: 18.7 % — ABNORMAL HIGH (ref 11.5–15.5)
WBC: 12.2 10*3/uL — ABNORMAL HIGH (ref 4.0–10.5)
nRBC: 0 % (ref 0.0–0.2)

## 2022-11-03 LAB — HEMOGLOBIN AND HEMATOCRIT, BLOOD
HCT: 27.5 % — ABNORMAL LOW (ref 39.0–52.0)
HCT: 28.6 % — ABNORMAL LOW (ref 39.0–52.0)
Hemoglobin: 8.5 g/dL — ABNORMAL LOW (ref 13.0–17.0)
Hemoglobin: 8.6 g/dL — ABNORMAL LOW (ref 13.0–17.0)

## 2022-11-03 LAB — BPAM RBC
Blood Product Expiration Date: 202401252359
Blood Product Expiration Date: 202401252359
Blood Product Expiration Date: 202402132359
ISSUE DATE / TIME: 202401102049
ISSUE DATE / TIME: 202401110832
ISSUE DATE / TIME: 202401121645
Unit Type and Rh: 7300
Unit Type and Rh: 7300
Unit Type and Rh: 7300

## 2022-11-03 LAB — BASIC METABOLIC PANEL WITH GFR
Anion gap: 7 (ref 5–15)
BUN: 27 mg/dL — ABNORMAL HIGH (ref 8–23)
CO2: 19 mmol/L — ABNORMAL LOW (ref 22–32)
Calcium: 8.1 mg/dL — ABNORMAL LOW (ref 8.9–10.3)
Chloride: 116 mmol/L — ABNORMAL HIGH (ref 98–111)
Creatinine, Ser: 1.41 mg/dL — ABNORMAL HIGH (ref 0.61–1.24)
GFR, Estimated: 51 mL/min — ABNORMAL LOW
Glucose, Bld: 132 mg/dL — ABNORMAL HIGH (ref 70–99)
Potassium: 3.9 mmol/L (ref 3.5–5.1)
Sodium: 142 mmol/L (ref 135–145)

## 2022-11-03 LAB — PREPARE RBC (CROSSMATCH)

## 2022-11-03 NOTE — Progress Notes (Signed)
PROGRESS NOTE    Shawn Jennings   PTW:[id number] DOB: 1946/01/12  DOA: 10/31/2022 Date of Service: 11/03/22 PCP: Merryl Hacker, No     Brief Narrative / Hospital Course:  Shawn Jennings is a 77 y.o. 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  Patient reports that since morning 10/31/21. mild epigastric pain that is "annoying" no radiation no aggravating or relieving factors.  No associated diarrhea or vomiting or fevers.  01/10: ED course - Hgb 6.3, received 1 unit PRBC in ED. Retic ct high at 6.4. CTA GI bleed No appreciable active GI bleeding noted.  01/11: Hgb still low at 6.8, ordered another unit blood. 01/12: Anticipate EGD/colonoscopy Monday, today is Friday.  Patient needs to be off Eliquis 2 days and hemoglobin over 7.  Low BP today, discontinue the rest of his antihypertensives.  Hgb down to 7.5, ordered another unit PRBC (3rd unit thus far). 01/13: Hgb maintaining at 8.5, 8.4   Consultants:  Gastroenterology   Procedures: None at this time       ASSESSMENT & PLAN:   Principal Problem:   Anemia Active Problems:   Essential hypertension   HFrEF (heart failure with reduced ejection fraction) (HCC)   Stage 3a chronic kidney disease (CKD) (HCC)   Coronary artery disease of native artery of native heart with stable angina pectoris (HCC)   Ischemic cardiomyopathy   PAF (paroxysmal atrial fibrillation) (HCC)   On anticoagulant therapy   Depression   GERD (gastroesophageal reflux disease)   Hyperlipidemia   Abdominal pain   Acute blood loss anemia   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  Patient needs to be off Eliquis 2 days and hemoglobin over 7  Essential hypertension with current hypotension likely volume loss related  HFrEF (heart failure with reduced ejection fraction) (HCC) not in exacerbation Coronary artery disease of native artery of native heart with stable angina pectoris (HCC) Ischemic cardiomyopathy PAF (paroxysmal atrial fibrillation) ([place]) on anticoagulant therapy Hyperlipidemia Atherosclerosis 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 (HCC) BP soft on this admission, concerning for hypovolemia d/t anemia  Holding home medications: Initially held Eliquis, Entresto, Lasix; also holding spironolactone, isosorbide, beta-blocker due to low blood pressure monitor VS for BP of course, rebound tachycardia or afib/RVR while off rate control meds, restart beta blocker as able   Stage 3a chronic kidney disease (CKD) (HCC) Metabolic acidosis Bicarb Treat underlying anemia Recheck BMP periodically   Other chronic medical conditions, stable: Depression -continue home lamotrigine, duloxetine 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[place]) -consider PT/OT when able Chronic obstructive pulmonary disease, unspecified ([place]) -continue   DVT prophylaxis: none at this time Pertinent IV fluids/nutrition: no continuous IV fluids Central lines / invasive devices: none  Code Status: FULL CODE   Current Admission Status: inpatient  TOC needs / Dispo plan: none at this time, anticipate dc  back to previous home environment +/- HH Barriers to discharge / significant pending items: GI eval for bleeding -  Anticipate EGD/colonoscopy Monday, today is Saturday.  Patient needs to be off Eliquis 2+ days and hemoglobin over 7           Subjective / Brief ROS:  Patient reports feeling a bit better today compared to yesterday Denies CP/SOB.  Pain controlled.  Denies new weakness.  Reports no concerns w/ urination/defecation.   Family Communication: pt declined call to family/support person(s)    Objective Findings:  Vitals:   11/02/22 2056 11/03/22 0202 11/03/22 0618 11/03/22 0839  BP: (!) 106/56 95/64 107/63 111/75  Pulse: 71 82 74 71  Resp: '20 18 18 16  '$ Temp: 98.2 F (36.8 C) 98.3 F (36.8 C) 98.8 F (37.1 C) (!) 97.5 F (36.4 C)  TempSrc: Oral Oral Oral   SpO2: 94% 100% 98% 91%  Weight:      Height:        Intake/Output Summary (Last 24 hours) at 11/03/2022 1312 Last data filed at 11/03/2022 9562 Gross per 24 hour  Intake 1149.39 ml  Output 1100 ml  Net 49.39 ml   Filed Weights   10/31/22 1720  Weight: 73 kg    Examination:  Physical Exam Constitutional:      General: He is not in acute distress.    Appearance: He is ill-appearing.  Cardiovascular:     Rate and Rhythm: Normal rate and regular rhythm.     Heart sounds: Murmur heard.     Systolic murmur is present.  Pulmonary:     Effort: Pulmonary effort is normal. No respiratory distress.     Breath sounds: Normal breath sounds.  Abdominal:     Palpations: Abdomen is soft.  Musculoskeletal:     Right lower leg: No edema.  Skin:    General: Skin is warm and dry.  Neurological:     General: No focal deficit present.     Mental Status: He is alert.     Comments: He is oriented to person, knows he is in the hospital but cannot name which hospital, reports it is 2003 but not when he is corrected to 2023, states the month is March  Psychiatric:        Mood and Affect: Mood normal.        Behavior: Behavior normal.          Scheduled Medications:   atorvastatin  80 mg Oral QHS   cholecalciferol   1,000 Units Oral Daily   cyanocobalamin  1,000 mcg Oral Daily   DULoxetine  30 mg Oral BID   ferrous sulfate  325 mg Oral QODAY   lamoTRIgine  25 mg Oral BID   loratadine  10 mg Oral Daily   melatonin  5 mg Oral QHS   montelukast  10 mg Oral QHS   polyethylene glycol-electrolytes  2,000 mL Oral Once   sodium bicarbonate  650 mg Oral TID   sodium chloride flush  3 mL Intravenous Q12H    Continuous Infusions:  pantoprazole 8 mg/hr (11/03/22 0444)    PRN Medications:  acetaminophen, albuterol, alum & mag hydroxide-simeth, hydrOXYzine, nitroGLYCERIN, oxyCODONE  Antimicrobials from admission:  Anti-infectives (From admission, onward)    Start     Dose/Rate Route Frequency Ordered Stop   11/01/22 2300  azithromycin (ZITHROMAX) 500 mg in sodium chloride 0.9 % 250 mL IVPB  Status:  Discontinued        500 mg 250 mL/hr  over 60 Minutes Intravenous Every 24 hours 11/01/22 0107 11/01/22 0748   11/01/22 2200  cefTRIAXone (ROCEPHIN) 1 g in sodium chloride 0.9 % 100 mL IVPB  Status:  Discontinued        1 g 200 mL/hr over 30 Minutes Intravenous Every 24 hours 11/01/22 0107 11/01/22 0748   11/01/22 0100  cefTRIAXone (ROCEPHIN) 1 g in sodium chloride 0.9 % 100 mL IVPB  Status:  Discontinued        1 g 200 mL/hr over 30 Minutes Intravenous Every 24 hours 11/01/22 0056 11/01/22 0107   11/01/22 0100  azithromycin (ZITHROMAX) 500 mg in sodium chloride 0.9 % 250 mL IVPB  Status:  Discontinued        500 mg 250 mL/hr over 60 Minutes Intravenous Every 24 hours 11/01/22 0056 11/01/22 0107   10/31/22 2115  cefTRIAXone (ROCEPHIN) 1 g in sodium chloride 0.9 % 100 mL IVPB        1 g 200 mL/hr over 30 Minutes Intravenous  Once 10/31/22 2111 10/31/22 2333   10/31/22 2115  azithromycin (ZITHROMAX) 500 mg in sodium chloride 0.9 % 250 mL IVPB        500 mg 250 mL/hr over 60 Minutes Intravenous  Once 10/31/22 2111 11/01/22 0055           Data Reviewed:  I have personally reviewed the  following...  CBC: Recent Labs  Lab 10/31/22 2355 11/01/22 0450 11/01/22 0750 11/01/22 2111 11/02/22 0734 11/02/22 1258 11/03/22 0105 11/03/22 0625  WBC 16.2* 17.5* 16.1*  --  13.8*  --   --  12.2*  HGB 6.5* 6.8* 6.4* 8.9* 7.7* 7.5* 8.5* 8.4*  HCT 21.8* 23.2* 21.8* 29.5* 25.0* 24.8* 27.5* 27.4*  MCV 86.2 85.9 86.2  --  83.6  --   --  84.8  PLT 332 335 332  --  330  --   --  035   Basic Metabolic Panel: Recent Labs  Lab 10/31/22 1722 11/01/22 0450 11/01/22 0750 11/02/22 0734 11/03/22 0625  NA 136  --  139 140 142  K 4.5  --  4.1 4.0 3.9  CL 111  --  114* 115* 116*  CO2 16*  --  19* 18* 19*  GLUCOSE 129*  --  123* 136* 132*  BUN 28*  --  26* 26* 27*  CREATININE 1.56*  --  1.51* 1.45* 1.41*  CALCIUM 8.2*  --  7.8* 7.9* 8.1*  PHOS  --  3.4  --   --   --    GFR: Estimated Creatinine Clearance: 44.6 mL/min (A) (by C-G formula based on SCr of 1.41 mg/dL (H)). Liver Function Tests: Recent Labs  Lab 10/31/22 1934  AST 15  ALT 9  ALKPHOS 80  BILITOT 0.7  PROT 6.5  ALBUMIN 3.1*   Recent Labs  Lab 10/31/22 2344  LIPASE 90*   No results for input(s): "AMMONIA" in the last 168 hours. Coagulation Profile: Recent Labs  Lab 11/01/22 0450  INR 1.5*   Cardiac Enzymes: No results for input(s): "CKTOTAL", "CKMB", "CKMBINDEX", "TROPONINI" in the last 168 hours. BNP (last 3 results) No results for input(s): "PROBNP" in the last 8760 hours. HbA1C: No results for input(s): "HGBA1C" in the last 72 hours. CBG: No results for input(s): "GLUCAP" in the last 168 hours. Lipid Profile: No results for input(s): "CHOL", "HDL", "LDLCALC", "TRIG", "CHOLHDL", "LDLDIRECT" in the last 72 hours. Thyroid Function Tests: No results for input(s): "TSH", "T4TOTAL", "FREET4", "T3FREE", "THYROIDAB" in the last 72 hours. Anemia  Panel: Recent Labs    10/31/22 2345  VITAMINB12 672  FOLATE 8.7  FERRITIN 17*  TIBC 302  IRON 59  RETICCTPCT 6.4*   Most Recent Urinalysis On File:      Component Value Date/Time   COLORURINE STRAW (A) 07/15/2021 1839   APPEARANCEUR CLEAR (A) 07/15/2021 1839   LABSPEC 1.014 07/15/2021 1839   PHURINE 5.0 07/15/2021 1839   GLUCOSEU 150 (A) 07/15/2021 1839   HGBUR NEGATIVE 07/15/2021 1839   BILIRUBINUR NEGATIVE 07/15/2021 1839   KETONESUR NEGATIVE 07/15/2021 1839   PROTEINUR NEGATIVE 07/15/2021 1839   NITRITE NEGATIVE 07/15/2021 1839   LEUKOCYTESUR NEGATIVE 07/15/2021 1839   Sepsis Labs: '@LABRCNTIP'$ (procalcitonin:4,lacticidven:4) Microbiology: No results found for this or any previous visit (from the past 240 hour(s)).    Radiology Studies last 3 days: CT ANGIO GI BLEED  Result Date: 10/31/2022 CLINICAL DATA:  Upper GI bleed with chest pain. EXAM: CTA ABDOMEN AND PELVIS WITHOUT AND WITH CONTRAST TECHNIQUE: Multidetector CT imaging of the abdomen and pelvis was performed using the standard protocol during bolus administration of intravenous contrast. Multiplanar reconstructed images and MIPs were obtained and reviewed to evaluate the vascular anatomy. RADIATION DOSE REDUCTION: This exam was performed according to the departmental dose-optimization program which includes automated exposure control, adjustment of the mA and/or kV according to patient size and/or use of iterative reconstruction technique. CONTRAST:  55m OMNIPAQUE IOHEXOL 350 MG/ML SOLN COMPARISON:  CTs of abdomen and pelvis with contrast 07/17/2022 and 07/15/2021. FINDINGS: VASCULAR Aorta: There is extensive calcific plaque in the aortic wall without penetrating ulcer, critical aortic stenosis or dissection. Maximum aortic caliber 2.6 cm.Recommend follow-up every 5 years. Reference: J Am Coll Radiol 29935;70:[id number] Celiac: There are ostial calcifications causing a 50% vessel origin stenosis. There are additional nonstenosing wall calcifications more distally. There are patchy calcific plaques in the splenic artery and hepatic arteries. SMA: There moderate to heavy patchy wall  calcifications but no more than 40% luminal stenosis. There is however, a high-grade proximal mixed plaque stenosis in the dominant right-sided trifurcation division of the SMA, with reconstitution distally as it extends to the left. Renals: Both renal arteries are single. There are patchy calcific plaques on the left-greater-than-right. There is up to 50-60% irregular stenosis of the left renal artery, right renal artery with up to 40% origin stenosis and otherwise well patent, with renovascular calcifications at both renal hila, more extensive on the left. IMA: Occluded. Inflow: Heavily calcified. 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 Slightly distal to this there is a 75% calcific stenosis focally just proximal the vessel bifurcation and no other focal stenosis. On the left there is a 60% common iliac arterial focal stenosis at the bifurcation. There is moderate irregular stenosis along both internal iliac arteries. Right external iliac artery demonstrating irregular up to 50% stenosis due to calcific plaques. Left external iliac artery occludes for a short-segment distal to its origin, then reconstitutes for the rest of its length but with only about 20% patency generally. Proximal Outflow: There is at least 80% calcific stenosis in the proximal right common femoral artery. The proximal deep and circumflex femoral arteries are heavily calcified and at least moderately stenotic. The proximal right SFA is occluded. There is a bypass graft from the right common femoral artery to the lowest point of the study near the base of the scrotum, which is patent. The graft arises distal to the proximal common femoral artery stenosis. On the  left, the deep and circumflex femoral arteries are heavily calcified but at least partially patent. There is occlusion of the left common femoral artery, occlusion of the native left SFA, and an occluded left femoral bypass  graft arising from the proximal common femoral artery and itself is proximally collapsed. There are multiple surgical clips in both inguinal areas. Veins: Patent as far as visualized. The left iliac veins are smaller than the right especially the left common iliac vein, which is compressed against the spine by the aorta and proximal left common iliac artery. Review of the MIP images confirms the above findings. NON-VASCULAR Lower chest: The cardiac size is normal. There are three-vessel coronary artery calcifications, aortic valve TAVR, and heavy calcification in the inferolateral mitral ring. The intraventricular blood pool is low in density consistent with anemia. There is interval worsening of bilateral lower lobe bronchial thickening, and on the right, interval worsening multifocal segmental and subsegmental bronchial/bronchiolar plugging in the middle and lower lobes, with patchy tree-in-bud interstitial changes also worsened. There is associated hazy airspace disease in the right middle lobe, posterior basal consolidation right lower lobe. Findings consistent with bronchopneumonia and probable aspiration pneumonitis. Please correlate clinically and consider aspiration precautions. Lung bases elsewhere are clear. Hepatobiliary: Mildly steatotic liver without mass. Surgically absent gallbladder without biliary dilatation. Pancreas: No abnormality. Spleen: No abnormality.  No splenomegaly. Adrenals/Urinary Tract: There is no adrenal mass. There are a few cortical hypodensities in both kidneys which are too small to characterize. These are unchanged. Again noted is a 1.7 cm complex septated cystic lesion in the superior pole of the right kidney. Although unchanged, MRI follow-up is recommended for further evaluation. There are renovascular calcifications at the renal hila but no urolithiasis is seen, no hydronephrosis or urinary obstruction. The bladder is unremarkable. 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 Endoscopy may be indicated but this seems unchanged. There is a debris-filled descending duodenal diverticulum again measuring 2.6 cm. The small and large bowel show no acute abnormality. The appendix is normal caliber. There is chronic dilatation of a left lower quadrant postsurgical small bowel segment, without associated obstruction. There is moderate retained stool ascending and transverse colon with terminal ileal fecal backup. There are scattered uncomplicated sigmoid diverticula. No contrast extravasation is seen into bowel. Lymphatic: No adenopathy is seen. Reproductive: There is a normal size prostate with dystrophic calcifications centrally. Both testicles are in the scrotal sac. There are epididymal tail calcifications. No hydroceles. Other: There is no free air, free fluid, free hemorrhage or incarcerated hernia. Musculoskeletal: There is osteopenia with degenerative changes of the spine. There is mild chronic anterior wedging of the T12 vertebral body. L5 is transitional. There are chronic healed left pubic rami fractures. IMPRESSION: 1. No appreciable active GI bleeding. 2. Extensive aortic and branch vessel atherosclerosis. 3. 2.6 cm abdominal aortic caliber. Imaging follow-up every 5 years recommended. 4. 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 5. High-grade proximal mixed plaque stenosis in the dominant right-sided trifurcation division of the SMA, with reconstitution distally. 6. Bilateral renal artery stenoses, left greater than right. 7. Left external iliac artery proximal occlusion with about 20% reconstitution for the rest of its length. 8. Right common femoral artery bypass graft is patent as far as seen. 9. Bilateral native SFA occlusion, occluded left common femoral artery, and occluded left femoral bypass graft. 10. Interval worsening of bilateral lower lobe bronchial thickening and right middle and lower lobe  segmental and subsegmental bronchial/bronchiolar plugging  with associated tree-in-bud interstitial changes and increased airspace disease in the right middle lobe and posterior basal right lower lobe. Findings consistent with bronchopneumonia with high likelihood of aspiration etiology. Please correlate clinically and consider aspiration precautions. 11. 1.7 cm complex septated cystic lesion in the superior pole of the right kidney. Nonemergent MRI follow-up is recommended. 12. Constipation with diverticulosis and distal ileal fecal back up. 13. Remaining findings discussed above. Aortic Atherosclerosis (ICD10-I70.0). Electronically Signed   By: Telford Nab M.D.   On: 10/31/2022 20:56   DG Chest 2 View  Result Date: 10/31/2022 CLINICAL DATA:  Chest pain EXAM: CHEST - 2 VIEW COMPARISON:  07/17/2022 FINDINGS: Post sternotomy changes. No acute airspace disease or effusion. Streaky atelectasis or scarring at the posterior lung base. Valve prosthesis. Stable cardiomediastinal silhouette with aortic atherosclerosis. No pneumothorax IMPRESSION: No active cardiopulmonary disease. Streaky atelectasis or scarring at the bases. Electronically Signed   By: Donavan Foil M.D.   On: 10/31/2022 17:59             LOS: 2 days      Emeterio Reeve, DO [facility] 11/03/2022, 1:12 PM    Dictation software may have been used to generate the above note. Typos may occur and escape review in typed/dictated notes. Please contact Dr Sheppard Coil directly for clarity if needed.  Staff may message me via secure chat in [place]  but this may not receive an immediate response,  please page me for urgent matters!  If 7PM-7AM, please contact night coverage [email or web address]

## 2022-11-04 DIAGNOSIS — D649 Anemia, unspecified: Secondary | ICD-10-CM | POA: Diagnosis not present

## 2022-11-04 DIAGNOSIS — R109 Unspecified abdominal pain: Secondary | ICD-10-CM | POA: Diagnosis not present

## 2022-11-04 DIAGNOSIS — I70219 Atherosclerosis of native arteries of extremities with intermittent claudication, unspecified extremity: Secondary | ICD-10-CM | POA: Diagnosis not present

## 2022-11-04 DIAGNOSIS — J449 Chronic obstructive pulmonary disease, unspecified: Secondary | ICD-10-CM | POA: Diagnosis not present

## 2022-11-04 LAB — HEMOGLOBIN AND HEMATOCRIT, BLOOD
HCT: 28.2 % — ABNORMAL LOW (ref 39.0–52.0)
HCT: 29.5 % — ABNORMAL LOW (ref 39.0–52.0)
Hemoglobin: 8.6 g/dL — ABNORMAL LOW (ref 13.0–17.0)
Hemoglobin: 8.8 g/dL — ABNORMAL LOW (ref 13.0–17.0)

## 2022-11-04 MED ORDER — PEG 3350-KCL-NA BICARB-NACL 420 G PO SOLR
4000.0000 mL | Freq: Once | ORAL | Status: AC
  Administered 2022-11-04: 4000 mL via ORAL
  Filled 2022-11-04: qty 4000

## 2022-11-04 NOTE — Progress Notes (Signed)
CCMD notified this RN of increased v-tack episode. This RN ensured telemetry connected correctly with full charged battery.   Dr Sheppard Coil notified with no new orders.   ADDENDUM 11/05/22 7:59 AM to RN note from [place] -  EKG was ordered

## 2022-11-04 NOTE — Plan of Care (Signed)
  Problem: Education: Goal: Knowledge of General Education information will improve Description: Including pain rating scale, medication(s)/side effects and non-pharmacologic comfort measures Outcome: Progressing   Problem: Health Behavior/Discharge Planning: Goal: Ability to manage health-related needs will improve Outcome: Progressing   Problem: Clinical Measurements: Goal: Ability to maintain clinical measurements within normal limits will improve Outcome: Progressing   Problem: Activity: Goal: Risk for activity intolerance will decrease Outcome: Progressing   Problem: Elimination: Goal: Will not experience complications related to bowel motility Outcome: Progressing   Problem: Pain Managment: Goal: General experience of comfort will improve Outcome: Progressing   Problem: Safety: Goal: Ability to remain free from injury will improve Outcome: Progressing

## 2022-11-04 NOTE — Progress Notes (Signed)
PROGRESS NOTE    Shawn Jennings   OZH:[id number] DOB: 1946/07/04  DOA: 10/31/2022 Date of Service: 11/04/22 PCP: Merryl Hacker, No     Brief Narrative / Hospital Course:  Shawn Jennings is a 77 y.o. 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  Patient reports that since morning 10/31/21. mild epigastric pain that is "annoying" no radiation no aggravating or relieving factors.  No associated diarrhea or vomiting or fevers.  01/10: ED course - Hgb 6.3, received 1 unit PRBC in ED. Retic ct high at 6.4. CTA GI bleed No appreciable active GI bleeding noted.  01/11: Hgb still low at 6.8, ordered another unit blood. 01/12: Anticipate EGD/colonoscopy Monday, today is Friday.  Patient needs to be off Eliquis 2 days and hemoglobin over 7.  Low BP today, discontinue the rest of his antihypertensives.  Hgb down to 7.5, ordered another unit PRBC (3rd unit thus far). 01/13: Hgb maintaining at 8.5, 8.4  01/14: Hgb maintaining at 8.6, 8.6.  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  Patient needs to be off Eliquis 2 days and hemoglobin over 7 - these goals have been met as of today  Confirmed plan EGD for tomorrow - today start CLD, NPO p mn, bowel prep ordered  Essential hypertension with current hypotension likely volume loss related  HFrEF (heart failure with reduced ejection fraction) (HCC) not in exacerbation Coronary artery disease of native artery of native heart with stable angina pectoris (HCC) Nonsustained VTach x5 beats 11/04/22  Ischemic cardiomyopathy PAF (paroxysmal atrial fibrillation) (HCC) on anticoagulant therapy Hyperlipidemia Atherosclerosis 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 (HCC) BP soft on this admission, concerning for hypovolemia d/t anemia  Holding home medications: Initially held Eliquis, Entresto, Lasix; also holding spironolactone, isosorbide, beta-blocker due to low blood pressure monitor VS for BP of course, rebound tachycardia or afib/RVR while off rate control meds, restart beta blocker as able but HR has been WNL NS VT no symptoms 11/04/22, 12-lead EKG ordered   Stage 3a chronic kidney disease (CKD) (HCC) Metabolic acidosis Bicarb Treat underlying anemia Recheck BMP   Other chronic medical conditions, stable: Depression -continue home lamotrigine, duloxetine 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[name]) -consider PT/OT when able Chronic obstructive  pulmonary disease, unspecified ([name]) -continue   DVT prophylaxis: none at this time Pertinent IV fluids/nutrition: no continuous IV fluids. Wil lmake npo after mn t [employer]  / invasive devices: none  Code Status: FULL CODE   Current Admission Status: inpatient  TOC needs / Dispo plan: none at this time, anticipate dc back to previous home environment +/- HH. Will ask PT/OT to eval after scope tomorrow Barriers to discharge / significant pending items: GI eval for bleeding - EGD/colonoscopy Monday, today is 'Sunday.             Subjective / Brief ROS:  Patient reports feeling okay today Denies CP/SOB.  Pain controlled.  Denies new weakness.  Reports no concerns w/ urination/defecation.   Family Communication: pt declined call to family/support person(s)    Objective Findings:  Vitals:   11/03/22 2107 11/03/22 2330 11/04/22 0605 11/04/22 0905  BP: 120/67 120/74 100/66 121/75  Pulse: 84 90 80 80  Resp: 18 20 18 16  Temp: 98 F (36.7 C) 98.1 F (36.7 C) 98 F (36.7 C) 98 F (36.7 C)  TempSrc: Oral Oral Oral   SpO2: 95% 95% 100% 97%  Weight:      Height:        Intake/Output Summary (Last 24 hours) at 11/04/2022 1215 Last data filed at 11/04/2022 0647 Gross per 24 hour  Intake --  Output 1100 ml  Net -1100 ml   Filed Weights   10/31/22 1720  Weight: 73 kg    Examination:  Physical Exam Constitutional:      General: He is not in acute distress.    Appearance: He is ill-appearing.  Cardiovascular:     Rate and Rhythm: Normal rate and regular rhythm.     Heart sounds: Murmur heard.     Systolic murmur is present.  Pulmonary:     Effort: Pulmonary effort is normal. No respiratory distress.     Breath sounds: Normal breath sounds.  Abdominal:     Palpations: Abdomen is soft.  Musculoskeletal:     Right lower leg: No edema.  Skin:    General: Skin is warm and dry.  Neurological:     General: No focal deficit present.     Mental Status: He is alert.  Psychiatric:        Mood and Affect: Mood normal.        Behavior: Behavior normal.          Scheduled Medications:   atorvastatin  80 mg Oral QHS    cholecalciferol  1,000 Units Oral Daily   cyanocobalamin  1,000 mcg Oral Daily   DULoxetine  30 mg Oral BID   ferrous sulfate  325 mg Oral QODAY   lamoTRIgine  25 mg Oral BID   loratadine  10 mg Oral Daily   melatonin  5 mg Oral QHS   montelukast  10 mg Oral QHS   polyethylene glycol-electrolytes  4,000 mL Oral Once   sodium bicarbonate  650 mg Oral TID   sodium chloride flush  3 mL Intravenous Q12H    Continuous Infusions:    PRN Medications:  acetaminophen, albuterol, alum & mag hydroxide-simeth, hydrOXYzine, nitroGLYCERIN, oxyCODONE  Antimicrobials from admission:  Anti-infectives (From admission, onward)    Start     Dose/Rate Route Frequency Ordered Stop   11/01/22 2300  azithromycin (ZITHROMAX) 500 mg in sodium chloride 0.9 % 250 mL IVPB  Status:  Discontinued        50'$ 0 mg 250 mL/hr over  60 Minutes Intravenous Every 24 hours 11/01/22 0107 11/01/22 0748   11/01/22 2200  cefTRIAXone (ROCEPHIN) 1 g in sodium chloride 0.9 % 100 mL IVPB  Status:  Discontinued        1 g 200 mL/hr over 30 Minutes Intravenous Every 24 hours 11/01/22 0107 11/01/22 0748   11/01/22 0100  cefTRIAXone (ROCEPHIN) 1 g in sodium chloride 0.9 % 100 mL IVPB  Status:  Discontinued        1 g 200 mL/hr over 30 Minutes Intravenous Every 24 hours 11/01/22 0056 11/01/22 0107   11/01/22 0100  azithromycin (ZITHROMAX) 500 mg in sodium chloride 0.9 % 250 mL IVPB  Status:  Discontinued        500 mg 250 mL/hr over 60 Minutes Intravenous Every 24 hours 11/01/22 0056 11/01/22 0107   10/31/22 2115  cefTRIAXone (ROCEPHIN) 1 g in sodium chloride 0.9 % 100 mL IVPB        1 g 200 mL/hr over 30 Minutes Intravenous  Once 10/31/22 2111 10/31/22 2333   10/31/22 2115  azithromycin (ZITHROMAX) 500 mg in sodium chloride 0.9 % 250 mL IVPB        500 mg 250 mL/hr over 60 Minutes Intravenous  Once 10/31/22 2111 11/01/22 0055           Data Reviewed:  I have personally reviewed the following...  CBC: Recent Labs   Lab 10/31/22 2355 11/01/22 0450 11/01/22 0750 11/01/22 2111 11/02/22 0734 11/02/22 1258 11/03/22 0105 11/03/22 0625 11/03/22 1656 11/04/22 0451  WBC 16.2* 17.5* 16.1*  --  13.8*  --   --  12.2*  --   --   HGB 6.5* 6.8* 6.4*   < > 7.7* 7.5* 8.5* 8.4* 8.6* 8.6*  HCT 21.8* 23.2* 21.8*   < > 25.0* 24.8* 27.5* 27.4* 28.6* 28.2*  MCV 86.2 85.9 86.2  --  83.6  --   --  84.8  --   --   PLT 332 335 332  --  330  --   --  295  --   --    < > = values in this interval not displayed.   Basic Metabolic Panel: Recent Labs  Lab 10/31/22 1722 11/01/22 0450 11/01/22 0750 11/02/22 0734 11/03/22 0625  NA 136  --  139 140 142  K 4.5  --  4.1 4.0 3.9  CL 111  --  114* 115* 116*  CO2 16*  --  19* 18* 19*  GLUCOSE 129*  --  123* 136* 132*  BUN 28*  --  26* 26* 27*  CREATININE 1.56*  --  1.51* 1.45* 1.41*  CALCIUM 8.2*  --  7.8* 7.9* 8.1*  PHOS  --  3.4  --   --   --    GFR: Estimated Creatinine Clearance: 44.6 mL/min (A) (by C-G formula based on SCr of 1.41 mg/dL (H)). Liver Function Tests: Recent Labs  Lab 10/31/22 1934  AST 15  ALT 9  ALKPHOS 80  BILITOT 0.7  PROT 6.5  ALBUMIN 3.1*   Recent Labs  Lab 10/31/22 2344  LIPASE 90*   No results for input(s): "AMMONIA" in the last 168 hours. Coagulation Profile: Recent Labs  Lab 11/01/22 0450  INR 1.5*   Cardiac Enzymes: No results for input(s): "CKTOTAL", "CKMB", "CKMBINDEX", "TROPONINI" in the last 168 hours. BNP (last 3 results) No results for input(s): "PROBNP" in the last 8760 hours. HbA1C: No results for input(s): "HGBA1C" in the last 72 hours. CBG: No results for  input(s): "GLUCAP" in the last 168 hours. Lipid Profile: No results for input(s): "CHOL", "HDL", "LDLCALC", "TRIG", "CHOLHDL", "LDLDIRECT" in the last 72 hours. Thyroid Function Tests: No results for input(s): "TSH", "T4TOTAL", "FREET4", "T3FREE", "THYROIDAB" in the last 72 hours. Anemia Panel: No results for input(s): "VITAMINB12", "FOLATE",  "FERRITIN", "TIBC", "IRON", "RETICCTPCT" in the last 72 hours.  Most Recent Urinalysis On File:     Component Value Date/Time   COLORURINE STRAW (A) 07/15/2021 1839   APPEARANCEUR CLEAR (A) 07/15/2021 1839   LABSPEC 1.014 07/15/2021 1839   PHURINE 5.0 07/15/2021 1839   GLUCOSEU 150 (A) 07/15/2021 1839   HGBUR NEGATIVE 07/15/2021 1839   BILIRUBINUR NEGATIVE 07/15/2021 1839   KETONESUR NEGATIVE 07/15/2021 1839   PROTEINUR NEGATIVE 07/15/2021 1839   NITRITE NEGATIVE 07/15/2021 1839   LEUKOCYTESUR NEGATIVE 07/15/2021 1839   Sepsis Labs: '@LABRCNTIP'$ (procalcitonin:4,lacticidven:4) Microbiology: No results found for this or any previous visit (from the past 240 hour(s)).    Radiology Studies last 3 days: CT ANGIO GI BLEED  Result Date: 10/31/2022 CLINICAL DATA:  Upper GI bleed with chest pain. EXAM: CTA ABDOMEN AND PELVIS WITHOUT AND WITH CONTRAST TECHNIQUE: Multidetector CT imaging of the abdomen and pelvis was performed using the standard protocol during bolus administration of intravenous contrast. Multiplanar reconstructed images and MIPs were obtained and reviewed to evaluate the vascular anatomy. RADIATION DOSE REDUCTION: This exam was performed according to the departmental dose-optimization program which includes automated exposure control, adjustment of the mA and/or kV according to patient size and/or use of iterative reconstruction technique. CONTRAST:  46m OMNIPAQUE IOHEXOL 350 MG/ML SOLN COMPARISON:  CTs of abdomen and pelvis with contrast 07/17/2022 and 07/15/2021. FINDINGS: VASCULAR Aorta: There is extensive calcific plaque in the aortic wall without penetrating ulcer, critical aortic stenosis or dissection. Maximum aortic caliber 2.6 cm.Recommend follow-up every 5 years. Reference: J Am Coll Radiol 23235;57:[id number] Celiac: There are ostial calcifications causing a 50% vessel origin stenosis. There are additional nonstenosing wall calcifications more distally. There are patchy  calcific plaques in the splenic artery and hepatic arteries. SMA: There moderate to heavy patchy wall calcifications but no more than 40% luminal stenosis. There is however, a high-grade proximal mixed plaque stenosis in the dominant right-sided trifurcation division of the SMA, with reconstitution distally as it extends to the left. Renals: Both renal arteries are single. There are patchy calcific plaques on the left-greater-than-right. There is up to 50-60% irregular stenosis of the left renal artery, right renal artery with up to 40% origin stenosis and otherwise well patent, with renovascular calcifications at both renal hila, more extensive on the left. IMA: Occluded. Inflow: Heavily calcified. 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 Slightly distal to this there is a 75% calcific stenosis focally just proximal the vessel bifurcation and no other focal stenosis. On the left there is a 60% common iliac arterial focal stenosis at the bifurcation. There is moderate irregular stenosis along both internal iliac arteries. Right external iliac artery demonstrating irregular up to 50% stenosis due to calcific plaques. Left external iliac artery occludes for a short-segment distal to its origin, then reconstitutes for the rest of its length but with only about 20% patency generally. Proximal Outflow: There is at least 80% calcific stenosis in the proximal right common femoral artery. The proximal deep and circumflex femoral arteries are heavily calcified and at least moderately stenotic. The proximal right SFA is occluded. There is a bypass graft from the right common  femoral artery to the lowest point of the study near the base of the scrotum, which is patent. The graft arises distal to the proximal common femoral artery stenosis. On the left, the deep and circumflex femoral arteries are heavily calcified but at least partially patent. There is occlusion of  the left common femoral artery, occlusion of the native left SFA, and an occluded left femoral bypass graft arising from the proximal common femoral artery and itself is proximally collapsed. There are multiple surgical clips in both inguinal areas. Veins: Patent as far as visualized. The left iliac veins are smaller than the right especially the left common iliac vein, which is compressed against the spine by the aorta and proximal left common iliac artery. Review of the MIP images confirms the above findings. NON-VASCULAR Lower chest: The cardiac size is normal. There are three-vessel coronary artery calcifications, aortic valve TAVR, and heavy calcification in the inferolateral mitral ring. The intraventricular blood pool is low in density consistent with anemia. There is interval worsening of bilateral lower lobe bronchial thickening, and on the right, interval worsening multifocal segmental and subsegmental bronchial/bronchiolar plugging in the middle and lower lobes, with patchy tree-in-bud interstitial changes also worsened. There is associated hazy airspace disease in the right middle lobe, posterior basal consolidation right lower lobe. Findings consistent with bronchopneumonia and probable aspiration pneumonitis. Please correlate clinically and consider aspiration precautions. Lung bases elsewhere are clear. Hepatobiliary: Mildly steatotic liver without mass. Surgically absent gallbladder without biliary dilatation. Pancreas: No abnormality. Spleen: No abnormality.  No splenomegaly. Adrenals/Urinary Tract: There is no adrenal mass. There are a few cortical hypodensities in both kidneys which are too small to characterize. These are unchanged. Again noted is a 1.7 cm complex septated cystic lesion in the superior pole of the right kidney. Although unchanged, MRI follow-up is recommended for further evaluation. There are renovascular calcifications at the renal hila but no urolithiasis is seen, no  hydronephrosis or urinary obstruction. The bladder is unremarkable. 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 Endoscopy may be indicated but this seems unchanged. There is a debris-filled descending duodenal diverticulum again measuring 2.6 cm. The small and large bowel show no acute abnormality. The appendix is normal caliber. There is chronic dilatation of a left lower quadrant postsurgical small bowel segment, without associated obstruction. There is moderate retained stool ascending and transverse colon with terminal ileal fecal backup. There are scattered uncomplicated sigmoid diverticula. No contrast extravasation is seen into bowel. Lymphatic: No adenopathy is seen. Reproductive: There is a normal size prostate with dystrophic calcifications centrally. Both testicles are in the scrotal sac. There are epididymal tail calcifications. No hydroceles. Other: There is no free air, free fluid, free hemorrhage or incarcerated hernia. Musculoskeletal: There is osteopenia with degenerative changes of the spine. There is mild chronic anterior wedging of the T12 vertebral body. L5 is transitional. There are chronic healed left pubic rami fractures. IMPRESSION: 1. No appreciable active GI bleeding. 2. Extensive aortic and branch vessel atherosclerosis. 3. 2.6 cm abdominal aortic caliber. Imaging follow-up every 5 years recommended. 4. 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 5. High-grade proximal mixed plaque stenosis in the dominant right-sided trifurcation division of the SMA, with reconstitution distally. 6. Bilateral renal artery stenoses, left greater than right. 7. Left external iliac artery proximal occlusion with about 20% reconstitution for the rest of its length. 8. Right common femoral artery bypass graft is patent as far as seen. 9. Bilateral native SFA occlusion, occluded  left common femoral artery, and occluded left femoral bypass graft. 10.  Interval worsening of bilateral lower lobe bronchial thickening and right middle and lower lobe segmental and subsegmental bronchial/bronchiolar plugging with associated tree-in-bud interstitial changes and increased airspace disease in the right middle lobe and posterior basal right lower lobe. Findings consistent with bronchopneumonia with high likelihood of aspiration etiology. Please correlate clinically and consider aspiration precautions. 11. 1.7 cm complex septated cystic lesion in the superior pole of the right kidney. Nonemergent MRI follow-up is recommended. 12. Constipation with diverticulosis and distal ileal fecal back up. 13. Remaining findings discussed above. Aortic Atherosclerosis (ICD10-I70.0). Electronically Signed   By: Telford Nab M.D.   On: 10/31/2022 20:56   DG Chest 2 View  Result Date: 10/31/2022 CLINICAL DATA:  Chest pain EXAM: CHEST - 2 VIEW COMPARISON:  07/17/2022 FINDINGS: Post sternotomy changes. No acute airspace disease or effusion. Streaky atelectasis or scarring at the posterior lung base. Valve prosthesis. Stable cardiomediastinal silhouette with aortic atherosclerosis. No pneumothorax IMPRESSION: No active cardiopulmonary disease. Streaky atelectasis or scarring at the bases. Electronically Signed   By: Donavan Foil M.D.   On: 10/31/2022 17:59             LOS: 3 days      Emeterio Reeve, DO [facility] 11/04/2022, 12:15 PM    Dictation software may have been used to generate the above note. Typos may occur and escape review in typed/dictated notes. Please contact Dr Sheppard Coil directly for clarity if needed.  Staff may message me via secure chat in [place]  but this may not receive an immediate response,  please page me for urgent matters!  If 7PM-7AM, please contact night coverage [email or web address]

## 2022-11-05 DIAGNOSIS — I70219 Atherosclerosis of native arteries of extremities with intermittent claudication, unspecified extremity: Secondary | ICD-10-CM | POA: Diagnosis not present

## 2022-11-05 DIAGNOSIS — D500 Iron deficiency anemia secondary to blood loss (chronic): Secondary | ICD-10-CM

## 2022-11-05 DIAGNOSIS — D649 Anemia, unspecified: Secondary | ICD-10-CM | POA: Diagnosis not present

## 2022-11-05 DIAGNOSIS — R109 Unspecified abdominal pain: Secondary | ICD-10-CM | POA: Diagnosis not present

## 2022-11-05 DIAGNOSIS — J449 Chronic obstructive pulmonary disease, unspecified: Secondary | ICD-10-CM | POA: Diagnosis not present

## 2022-11-05 LAB — BASIC METABOLIC PANEL
Anion gap: 6 (ref 5–15)
BUN: 23 mg/dL (ref 8–23)
CO2: 22 mmol/L (ref 22–32)
Calcium: 7.9 mg/dL — ABNORMAL LOW (ref 8.9–10.3)
Chloride: 112 mmol/L — ABNORMAL HIGH (ref 98–111)
Creatinine, Ser: 1.22 mg/dL (ref 0.61–1.24)
GFR, Estimated: >60 mL/min (ref >60)
Glucose, Bld: 127 mg/dL — ABNORMAL HIGH (ref 70–99)
Potassium: 4.2 mmol/L (ref 3.5–5.1)
Sodium: 140 mmol/L (ref 135–145)

## 2022-11-05 LAB — CBC
HCT: 29.2 % — ABNORMAL LOW (ref 39.0–52.0)
Hemoglobin: 8.7 g/dL — ABNORMAL LOW (ref 13.0–17.0)
MCH: 25.6 pg — ABNORMAL LOW (ref 26.0–34.0)
MCHC: 29.8 g/dL — ABNORMAL LOW (ref 30.0–36.0)
MCV: 85.9 fL (ref 80.0–100.0)
Platelets: 309 10*3/uL (ref 150–400)
RBC: 3.4 MIL/uL — ABNORMAL LOW (ref 4.22–5.81)
RDW: 17.9 % — ABNORMAL HIGH (ref 11.5–15.5)
WBC: 11.8 10*3/uL — ABNORMAL HIGH (ref 4.0–10.5)
nRBC: 0 % (ref 0.0–0.2)

## 2022-11-05 LAB — PROTEIN ELECTROPHORESIS, SERUM
A/G Ratio: 1 (ref 0.7–1.7)
Albumin ELP: 2.5 g/dL — ABNORMAL LOW (ref 2.9–4.4)
Alpha-1-Globulin: 0.3 g/dL (ref 0.0–0.4)
Alpha-2-Globulin: 0.6 g/dL (ref 0.4–1.0)
Beta Globulin: 0.8 g/dL (ref 0.7–1.3)
Gamma Globulin: 0.6 g/dL (ref 0.4–1.8)
Globulin, Total: 2.4 g/dL (ref 2.2–3.9)
Total Protein ELP: 4.9 g/dL — ABNORMAL LOW (ref 6.0–8.5)

## 2022-11-05 MED ORDER — PEG 3350-KCL-NA BICARB-NACL 420 G PO SOLR
4000.0000 mL | Freq: Once | ORAL | Status: AC
  Administered 2022-11-05: 4000 mL via ORAL
  Filled 2022-11-05: qty 4000

## 2022-11-05 MED ORDER — SODIUM CHLORIDE 0.9 % IV BOLUS
500.0000 mL | Freq: Once | INTRAVENOUS | Status: AC
  Administered 2022-11-05: 500 mL via INTRAVENOUS

## 2022-11-05 MED ORDER — DIGOXIN 0.25 MG/ML IJ SOLN
0.2500 mg | Freq: Once | INTRAMUSCULAR | Status: AC
  Administered 2022-11-05: 0.25 mg via INTRAVENOUS
  Filled 2022-11-05: qty 2

## 2022-11-05 NOTE — Progress Notes (Signed)
SLP Cancellation Note  Patient Details Name: Shawn Jennings MRN: [id number] DOB: 08/21/1946   Cancelled treatment:       Reason Eval/Treat Not Completed: Medical issues which prohibited therapy   Pt NPO for EGD. Will continue efforts next date as appropriate.  Cherrie Gauze, M.S., [facility] [phone] [place])  Quintella Baton 11/05/2022, 7:54 AM

## 2022-11-05 NOTE — Progress Notes (Signed)
   11/05/22 0115  Assess: MEWS Score  BP (!) 88/60  MAP (mmHg) 68  Pulse Rate (!) 124  SpO2 96 %  Assess: MEWS Score  MEWS Temp 0  MEWS Systolic 1  MEWS Pulse 2  MEWS RR 0  MEWS LOC 0  MEWS Score 3  MEWS Score Color Yellow  Assess: if the MEWS score is Yellow or Red  Were vital signs taken at a resting state? Yes  Focused Assessment No change from prior assessment  Does the patient meet 2 or more of the SIRS criteria? No  Does the patient have a confirmed or suspected source of infection? No  MEWS guidelines implemented *See Row Information* Yes  Treat  MEWS Interventions Other (Comment) (provider informed)  Take Vital Signs  Increase Vital Sign Frequency  Yellow: Q 2hr X 2 then Q 4hr X 2, if remains yellow, continue Q 4hrs  Notify: Charge Nurse/RN  Name of Charge Nurse/RN Notified Mary RN  Date Charge Nurse/RN Notified 11/05/22  Time Charge Nurse/RN Notified 0222  Provider Notification  Provider Name/Title Dr Sidney Ace  Date Provider Notified 11/05/22  Time Provider Notified 0222  Method of Notification Page  Notification Reason Other (Comment) (abnormal bp and HR)  Document  Progress note created (see row info) Yes  Assess: SIRS CRITERIA  SIRS Temperature  0  SIRS Pulse 1  SIRS Respirations  0  SIRS WBC 0  SIRS Score Sum  1

## 2022-11-05 NOTE — Progress Notes (Signed)
OT Cancellation Note  Patient Details Name: Shawn Jennings MRN: [id number] DOB: 05-11-1946   Cancelled Treatment:    Reason Eval/Treat Not Completed: OT screened, no needs identified, will sign off;Patient declined, no reason specified. Chart reviewed, per PT pt refusing therapy at discharge. Pt reports being agitated that he does not have his w/c present (states he knows we are at hospital, then states w/c is in room 111, then states it is at his facility). Pt stating he does not want to work with OT while here and will not get OOB without his w/c. Plan to sign off, please re-consult if new needs arise.   Dessie Coma, M.S. OTR/L  11/05/22, 12:44 PM  ascom [phone]

## 2022-11-05 NOTE — Progress Notes (Signed)
Patient had about 5 cups of the bowel prep, at 1 am stated he can't drink anymore; explained the importance of completing the prep, seeing that he have not had a bowel movement. States that's ok he just wont have the procedure then. Pt went back to sleep. From the start of shift patient was coached to drink. This morning he started to sip again; I explained how this work and he said ok, sip again. Been sipping on 1 cup since 1am

## 2022-11-05 NOTE — Evaluation (Signed)
Physical Therapy Evaluation Patient Details Name: Shawn Jennings MRN: [id number] DOB: January 13, 1946 Today's Date: 11/05/2022  History of Present Illness  Patient is a 77 year old male presenting with epigastric pain, anemia. History of left AKA, CAD, heart failure, COPD, CKD, HTN, PAD, A-fib, diabetes, GI bleed.  Clinical Impression  Patient is agreeable to PT. He reports difficulty with drinking bowel prep due to the taste and states he has not had a bowel movement. He has been at [facility] for 2 years and is Mod I at the wheelchair level.  He does not currently get PT at the facility and is not interested in PT once discharged.  The patient was able to sit up on edge of bed with minimal assistance. He can scoot posteriorly and repositioning in bed without physical assistance. No dizziness or pain is reported with upright activity. He declined getting out of bed to the chair at this time. Anticipate patient can return to previous living arrangements. PT will follow while in the hospital to maximize independence and decrease caregiver burden.      Recommendations for follow up therapy are one component of a multi-disciplinary discharge planning process, led by the attending physician.  Recommendations may be updated based on patient status, additional functional criteria and insurance authorization.  Follow Up Recommendations Long-term institutional care without follow-up therapy      Assistance Recommended at Discharge Intermittent Supervision/Assistance  Patient can return home with the following  A little help with walking and/or transfers;A little help with bathing/dressing/bathroom;Assist for transportation;Help with stairs or ramp for entrance    Equipment Recommendations None recommended by PT  Recommendations for Other Services       Functional Status Assessment Patient has had a recent decline in their functional status and demonstrates the ability to make significant  improvements in function in a reasonable and predictable amount of time.     Precautions / Restrictions Precautions Precautions: Fall Restrictions Weight Bearing Restrictions: No      Mobility  Bed Mobility Overal bed mobility: Needs Assistance Bed Mobility: Supine to Sit, Sit to Supine     Supine to sit: Min assist Sit to supine: Supervision   General bed mobility comments: trunk support for sitting upright    Transfers     Transfers: Bed to chair/wheelchair/BSC         Anterior-Posterior transfers: Supervision   General transfer comment: patient able to scoot in bed without physical assistance. he declined getting up to the chair. no dizziness reported with upright activity    Ambulation/Gait               General Gait Details: patient reports he is non ambulatory at baseline  Stairs            Wheelchair Mobility    Modified Rankin (Stroke Patients Only)       Balance Overall balance assessment: Needs assistance Sitting-balance support: Feet supported Sitting balance-Leahy Scale: Good                                       Pertinent Vitals/Pain Pain Assessment Pain Assessment: No/denies pain    Home Living Family/patient expects to be discharged to:: Assisted living                   Additional Comments: lives at [facility] for 2 years. patient reports supplemental oxygen at baseline 4 L02  Prior Function Prior Level of Function : Needs assist             Mobility Comments: patient reports he is independent with squat pivot transfer to wheelchair. no physical assistance required for wheelchair management. no standing or walking at baseline ADLs Comments: at least set-up assistance provided. can have physical assistance if needed with bathing and dressing     Hand Dominance        Extremity/Trunk Assessment   Upper Extremity Assessment Upper Extremity Assessment: Generalized weakness     Lower Extremity Assessment Lower Extremity Assessment: Generalized weakness       Communication   Communication: No difficulties  Cognition Arousal/Alertness: Awake/alert Behavior During Therapy: WFL for tasks assessed/performed Overall Cognitive Status: Within Functional Limits for tasks assessed                                          General Comments      Exercises     Assessment/Plan    PT Assessment Patient needs continued PT services  PT Problem List Decreased strength;Decreased activity tolerance;Decreased balance;Decreased mobility       PT Treatment Interventions DME instruction;Gait training;Functional mobility training;Stair training;Therapeutic activities;Therapeutic exercise;Balance training;Neuromuscular re-education;Patient/family education;Wheelchair mobility training    PT Goals (Current goals can be found in the Care Plan section)  Acute Rehab PT Goals Patient Stated Goal: to have his procedure and go home PT Goal Formulation: With patient Time For Goal Achievement: 11/19/22 Potential to Achieve Goals: Fair Additional Goals Additional Goal #1: Mod I for propelling wheelchair 232f with no safety cues required in preparation for mobility at ALF    Frequency Min 2X/week     Co-evaluation               AM-PAC PT "6 Clicks" Mobility  Outcome Measure Help needed turning from your back to your side while in a flat bed without using bedrails?: None Help needed moving from lying on your back to sitting on the side of a flat bed without using bedrails?: A Little Help needed moving to and from a bed to a chair (including a wheelchair)?: A Little Help needed standing up from a chair using your arms (e.g., wheelchair or bedside chair)?: A Lot Help needed to walk in hospital room?: A Lot Help needed climbing 3-5 steps with a railing? : Total 6 Click Score: 15    End of Session Equipment Utilized During Treatment: Oxygen Activity  Tolerance: Patient tolerated treatment well Patient left: with call bell/phone within reach;with bed alarm set;in bed Nurse Communication: Mobility status PT Visit Diagnosis: Muscle weakness (generalized) (M62.81);Difficulty in walking, not elsewhere classified (R26.2)    Time: [id number] Time Calculation (min) (ACUTE ONLY): 13 min   Charges:   PT Evaluation $PT Eval Low Complexity: 1 Low          TMinna Merritts PT, MPT   TPercell Locus1/15/2024, 10:01 AM

## 2022-11-05 NOTE — Progress Notes (Signed)
[name], MD [facility]   [address], [place], [place] [address] Phone: [phone] Fax : [phone]   Subjective: The patient is sitting comfortably in bed.  He knows that he is at the hospital.  He states that he does not have any family and his facility makes his decisions with him.  The patient denies any abdominal pain at the present time and no black stools.  The patient was supposed to have an EGD and colonoscopy today but did not take the prep and refused it.  I asked him why he said that it was because he was stubborn but regretted his decision.   Objective: Vital signs in last 24 hours: Vitals:   11/05/22 0115 11/05/22 0330 11/05/22 0530 11/05/22 0832  BP: (!) 88/60 (!) 89/66 101/67 92/71  Pulse: (!) 124 (!) 129 93 (!) 56  Resp:  '18 18 16  '$ Temp:  97.9 F (36.6 C) 97.6 F (36.4 C) 97.8 F (36.6 C)  TempSrc:  Oral  Oral  SpO2: 96% 94% 93% 96%  Weight:      Height:       Weight change:   Intake/Output Summary (Last 24 hours) at 11/05/2022 1158 Last data filed at 11/05/2022 1145 Gross per 24 hour  Intake 500 ml  Output 800 ml  Net -300 ml     Exam: Heart:: Positive murmur Lungs: normal and clear to auscultation and percussion Abdomen: soft, nontender, normal bowel sounds   Lab Results: '@LABTEST2'$ @ Micro Results: No results found for this or any previous visit (from the past 240 hour(s)). Studies/Results: No results found. Medications: I have reviewed the patient's current medications. Scheduled Meds:  atorvastatin  80 mg Oral QHS   cholecalciferol  1,000 Units Oral Daily   cyanocobalamin  1,000 mcg Oral Daily   DULoxetine  30 mg Oral BID   ferrous sulfate  325 mg Oral QODAY   lamoTRIgine  25 mg Oral BID   loratadine  10 mg Oral Daily   melatonin  5 mg Oral QHS   montelukast  10 mg Oral QHS   sodium bicarbonate  650 mg Oral TID   sodium chloride flush  3 mL Intravenous Q12H   Continuous Infusions: PRN Meds:.acetaminophen, albuterol,  alum & mag hydroxide-simeth, hydrOXYzine, nitroGLYCERIN, oxyCODONE   Assessment: Principal Problem:   Anemia Active Problems:   Stage 3a chronic kidney disease (CKD) (HCC)   Essential hypertension   Coronary artery disease of native artery of native heart with stable angina pectoris (HCC)   Ischemic cardiomyopathy   PAF (paroxysmal atrial fibrillation) (HCC)   On anticoagulant therapy   Depression   GERD (gastroesophageal reflux disease)   Hyperlipidemia   Abdominal pain   Acute blood loss anemia   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 Vicente Males.  The patient did not take his prep last night.  The patient states that he will take a prep tonight and understands the importance of doing the upper endoscopy and colonoscopy due to his drop in hemoglobin.  The patient will be reprepped for a colonoscopy and EGD for tomorrow.  The patient has been explained the plan agrees with it.   LOS:  4 days   Lewayne Bunting 11/05/2022, 11:58 AM Pager [phone] 7am-5pm  Check AMION for 5pm -7am coverage and on weekends

## 2022-11-05 NOTE — Progress Notes (Signed)
   11/05/22 0330  Vitals  Temp 97.9 F (36.6 C)  Temp Source Oral  BP (!) 89/66  MAP (mmHg) 75  BP Location Right Arm  BP Method Automatic  Patient Position (if appropriate) Lying  Pulse Rate (!) 129  Pulse Rate Source Monitor  Resp 18  MEWS COLOR  MEWS Score Color Yellow  Oxygen Therapy  SpO2 94 %  O2 Device Nasal Cannula  O2 Flow Rate (L/min) 2 L/min  MEWS Score  MEWS Temp 0  MEWS Systolic 1  MEWS Pulse 2  MEWS RR 0  MEWS LOC 0  MEWS Score 3  Provider Notification  Provider Name/Title Dr.Mansy  Date Provider Notified 11/05/22  Time Provider Notified 0345  Method of Notification Page  Notification Reason Other (Comment) (vital sign)  Provider response See new orders  Date of Provider Response 11/05/22  Time of Provider Response 0400

## 2022-11-05 NOTE — Progress Notes (Signed)
PROGRESS NOTE    Shawn Jennings   ZOX:[id number] DOB: 16-Aug-1946  DOA: 10/31/2022 Date of Service: 11/05/22 PCP: Merryl Hacker, No     Brief Narrative / Hospital Course:  Shawn Jennings is a 77 y.o. 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  Patient reports that since morning 10/31/21. mild epigastric pain that is "annoying" no radiation no aggravating or relieving factors.  No associated diarrhea or vomiting or fevers.  01/10: ED course - Hgb 6.3, received 1 unit PRBC in ED. Retic ct high at 6.4. CTA GI bleed No appreciable active GI bleeding noted.  01/11: Hgb still low at 6.8, ordered another unit blood. 01/12: Anticipate EGD/colonoscopy Monday, today is Friday.  Patient needs to be off Eliquis 2 days and hemoglobin over 7.  Low BP today, discontinue the rest of his antihypertensives.  Hgb down to 7.5, ordered another unit PRBC (3rd unit thus far). 01/13: Hgb maintaining at 8.5, 8.4  01/14: Hgb maintaining at 8.6, 8.6. Later in day refusing bowel prep 01/15: repeat bowel prep today, EGD/colonoscopy tomorrow   Consultants:  Gastroenterology   Procedures: None at this time       ASSESSMENT & PLAN:   Principal Problem:   Anemia Active Problems:   Essential hypertension   HFrEF (heart failure with reduced ejection fraction) (HCC)   Stage 3a chronic kidney disease (CKD) ([place])   Coronary artery disease of native artery of native heart with stable angina pectoris (HCC)   Ischemic cardiomyopathy   PAF (paroxysmal atrial fibrillation) (HCC)   On anticoagulant therapy   Depression   GERD (gastroesophageal reflux disease)   Hyperlipidemia   Abdominal pain   Acute blood loss anemia   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  Patient needs to be off Eliquis 2 days and hemoglobin over 7 - these goals have been met  plan EGD for 01/15  however patient was refusing prep... amenable now, reschedule procedures for tomorrow  Essential hypertension with current hypotension likely volume loss related  HFrEF (heart failure with reduced ejection fraction) (HCC) not in exacerbation Coronary artery disease of native artery of native heart with stable angina pectoris (HCC) Nonsustained VTach x5 beats 11/04/22  Ischemic cardiomyopathy PAF (paroxysmal atrial fibrillation) ([place]) on anticoagulant therapy Hyperlipidemia Atherosclerosis 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 (HCC) BP soft on this admission, concerning for hypovolemia d/t anemia  Holding home medications: Initially held Eliquis, Entresto, Lasix; also holding spironolactone, isosorbide, beta-blocker due to low blood pressure monitor VS for BP of course, rebound tachycardia or afib/RVR while off rate control meds, restart beta blocker as able but HR has been WNL NSVT no symptoms 11/04/22, 12-lead EKG ok   Stage 3a chronic kidney disease (CKD) (HCC) Metabolic acidosis Bicarb Treat underlying anemia monitor BMP   Other chronic medical conditions, stable: Depression -continue home lamotrigine, duloxetine Acquired absence of  left leg above knee (HCC) -consider PT/OT when able Chronic obstructive pulmonary disease, unspecified ([place]) -continue home meds   DVT prophylaxis:  none at this time Pertinent IV fluids/nutrition: no continuous IV fluids. NPO p mn.  Central lines / invasive devices: none  Code Status: FULL CODE   Current Admission Status: inpatient  TOC needs / Dispo plan: none at this time, anticipate dc back to previous home environment +/- HH. Will ask PT/OT to eval after scope  Barriers to discharge / significant pending items: GI eval for bleeding - EGD/colonoscopy tomorrow see above.             Subjective / Brief ROS:  Patient reports feeling okay today Denies CP/SOB.  Pain controlled.  Denies new weakness.  Reports no concerns w/ urination/defecation.  Discussed need to take bowel prep, he will do this he says   Family Communication: pt declined call to family/support person(s)    Objective Findings:  Vitals:   11/05/22 0330 11/05/22 0530 11/05/22 0832 11/05/22 1209  BP: (!) 89/66 101/67 92/71 107/69  Pulse: (!) 129 93 (!) 56 77  Resp: '18 18 16 18  '$ Temp: 97.9 F (36.6 C) 97.6 F (36.4 C) 97.8 F (36.6 C) 98 F (36.7 C)  TempSrc: Oral  Oral   SpO2: 94% 93% 96% 98%  Weight:      Height:        Intake/Output Summary (Last 24 hours) at 11/05/2022 1312 Last data filed at 11/05/2022 1145 Gross per 24 hour  Intake 500 ml  Output 800 ml  Net -300 ml    Filed Weights   10/31/22 1720  Weight: 73 kg    Examination:  Physical Exam Constitutional:      General: He is not in acute distress.    Appearance: He is ill-appearing.  Cardiovascular:     Rate and Rhythm: Normal rate and regular rhythm.     Heart sounds: Murmur heard.     Systolic murmur is present.  Pulmonary:     Effort: Pulmonary effort is normal. No respiratory distress.     Breath sounds: Normal breath sounds.  Abdominal:     Palpations: Abdomen is soft.  Musculoskeletal:     Right lower leg: No edema.  Skin:    General: Skin is warm and dry.  Neurological:     General: No focal deficit present.     Mental Status: He is alert.  Psychiatric:         Mood and Affect: Mood normal.        Behavior: Behavior normal.          Scheduled Medications:   atorvastatin  80 mg Oral QHS   cholecalciferol  1,000 Units Oral Daily   cyanocobalamin  1,000 mcg Oral Daily   DULoxetine  30 mg Oral BID   ferrous sulfate  325 mg Oral QODAY   lamoTRIgine  25 mg Oral BID   loratadine  10 mg Oral Daily   melatonin  5 mg Oral QHS   montelukast  10 mg Oral QHS   polyethylene glycol-electrolytes  4,000 mL Oral Once   sodium bicarbonate  650 mg Oral TID   sodium chloride flush  3 mL Intravenous Q12H    Continuous Infusions:    PRN Medications:  acetaminophen, albuterol, alum & mag hydroxide-simeth, hydrOXYzine, nitroGLYCERIN, oxyCODONE  Antimicrobials from admission:  Anti-infectives (From admission, onward)    Start     Dose/Rate Route Frequency Ordered Stop   11/01/22 2300  azithromycin (ZITHROMAX) 500 mg in sodium chloride 0.9 % 250 mL IVPB  Status:  Discontinued        500 mg 250 mL/hr over 60 Minutes Intravenous Every 24 hours 11/01/22 0107 11/01/22 0748   11/01/22 2200  cefTRIAXone (ROCEPHIN) 1 g in sodium chloride 0.9 % 100 mL IVPB  Status:  Discontinued        1 g 200 mL/hr over 30 Minutes Intravenous Every 24 hours 11/01/22 0107 11/01/22 0748   11/01/22 0100  cefTRIAXone (ROCEPHIN) 1 g in sodium chloride 0.9 % 100 mL IVPB  Status:  Discontinued        1 g 200 mL/hr over 30 Minutes Intravenous Every 24 hours 11/01/22 0056 11/01/22 0107   11/01/22 0100  azithromycin (ZITHROMAX) 500 mg in sodium chloride 0.9 % 250 mL IVPB  Status:  Discontinued        500 mg 250 mL/hr over 60 Minutes Intravenous Every 24 hours 11/01/22 0056 11/01/22 0107   10/31/22 2115  cefTRIAXone (ROCEPHIN) 1 g in sodium chloride 0.9 % 100 mL IVPB        1 g 200 mL/hr over 30 Minutes Intravenous  Once 10/31/22 2111 10/31/22 2333   10/31/22 2115  azithromycin (ZITHROMAX) 500 mg in sodium chloride 0.9 % 250 mL IVPB        500 mg 250 mL/hr over 60 Minutes  Intravenous  Once 10/31/22 2111 11/01/22 0055           Data Reviewed:  I have personally reviewed the following...  CBC: Recent Labs  Lab 11/01/22 0450 11/01/22 0750 11/01/22 2111 11/02/22 0734 11/02/22 1258 11/03/22 0625 11/03/22 1656 11/04/22 0451 11/04/22 1622 11/05/22 0506  WBC 17.5* 16.1*  --  13.8*  --  12.2*  --   --   --  11.8*  HGB 6.8* 6.4*   < > 7.7*   < > 8.4* 8.6* 8.6* 8.8* 8.7*  HCT 23.2* 21.8*   < > 25.0*   < > 27.4* 28.6* 28.2* 29.5* 29.2*  MCV 85.9 86.2  --  83.6  --  84.8  --   --   --  85.9  PLT 335 332  --  330  --  295  --   --   --  309   < > = values in this interval not displayed.    Basic Metabolic Panel: Recent Labs  Lab 10/31/22 1722 11/01/22 0450 11/01/22 0750 11/02/22 0734 11/03/22 0625 11/05/22 0506  NA 136  --  139 140 142 140  K 4.5  --  4.1 4.0 3.9 4.2  CL 111  --  114* 115* 116* 112*  CO2 16*  --  19* 18* 19* 22  GLUCOSE 129*  --  123* 136* 132* 127*  BUN 28*  --  26* 26* 27* 23  CREATININE 1.56*  --  1.51* 1.45* 1.41* 1.22  CALCIUM 8.2*  --  7.8* 7.9* 8.1* 7.9*  PHOS  --  3.4  --   --   --   --     GFR: Estimated Creatinine Clearance: 51.5 mL/min (by C-G formula based on SCr of 1.22 mg/dL). Liver Function Tests: Recent Labs  Lab 10/31/22 1934  AST 15  ALT 9  ALKPHOS 80  BILITOT 0.7  PROT 6.5  ALBUMIN 3.1*    Recent Labs  Lab 10/31/22 2344  LIPASE 90*    No results for input(s): "AMMONIA" in the last 168 hours. Coagulation Profile: Recent Labs  Lab 11/01/22 0450  INR 1.5*    Cardiac Enzymes: No results for input(s): "CKTOTAL", "CKMB", "CKMBINDEX", "TROPONINI" in the last 168 hours. BNP (last 3 results) No results for input(s): "PROBNP" in the last 8760 hours. HbA1C: No results for input(s): "HGBA1C" in the last 72 hours. CBG: No results for input(s): "GLUCAP" in the last 168 hours. Lipid Profile: No results for input(s): "CHOL", "HDL", "LDLCALC", "TRIG", "CHOLHDL", "LDLDIRECT" in the last 72  hours. Thyroid Function Tests: No results for input(s): "TSH", "T4TOTAL", "FREET4", "T3FREE", "THYROIDAB" in the last 72 hours. Anemia Panel: No results for input(s): "VITAMINB12", "FOLATE", "FERRITIN", "TIBC", "IRON", "RETICCTPCT" in the last 72 hours.  Most Recent Urinalysis On File:     Component Value Date/Time   COLORURINE STRAW (A) 07/15/2021 1839   APPEARANCEUR CLEAR (A) 07/15/2021 1839   LABSPEC 1.014 07/15/2021 1839   PHURINE 5.0 07/15/2021 1839   GLUCOSEU 150 (A) 07/15/2021 1839   HGBUR NEGATIVE 07/15/2021 1839   BILIRUBINUR NEGATIVE 07/15/2021 1839   KETONESUR NEGATIVE 07/15/2021 1839   PROTEINUR NEGATIVE 07/15/2021 1839   NITRITE NEGATIVE 07/15/2021 1839   LEUKOCYTESUR NEGATIVE 07/15/2021 1839   Sepsis Labs: '@LABRCNTIP'$ (procalcitonin:4,lacticidven:4) Microbiology: No results found for this or any previous visit (from the past 240 hour(s)).    Radiology Studies last 3 days: No results found.           LOS: 4 days      Emeterio Reeve, DO [facility] 11/05/2022, 1:12 PM    Dictation software may have been used to generate the above note. Typos may occur and escape review in typed/dictated notes. Please contact Dr Sheppard Coil directly for clarity if needed.  Staff may message me via secure chat in [place]  but this may not receive an immediate response,  please page me for urgent matters!  If 7PM-7AM, please contact night coverage [email or web address]

## 2022-11-05 NOTE — Care Management Important Message (Signed)
Important Message  Patient Details  Name: Shawn Jennings MRN: [id number] Date of Birth: Nov 17, 1945   Medicare Important Message Given:  N/A - LOS <3 / Initial given by admissions     [name] 11/05/2022, 7:50 AM

## 2022-11-05 NOTE — TOC Progression Note (Signed)
Transition of Care [facility]) - Progression Note    Patient Details  Name: Shawn Jennings MRN: [id number] Date of Birth: April 04, 1946  [facility] CM/SW Contact  Gerilyn Pilgrim, LCSW Phone Number: 11/05/2022, 3:17 PM  Clinical Narrative:   LVM with Anderson Malta at [facility] about patient returning after EGD.          Expected Discharge Plan and Services                                               Social Determinants of Health (SDOH) Interventions SDOH Screenings   Food Insecurity: No Food Insecurity (11/01/2022)  Housing: Low Risk  (11/01/2022)  Transportation Needs: No Transportation Needs (11/01/2022)  Utilities: Not At Risk (11/01/2022)  Depression (PHQ2-9): Low Risk  (06/11/2022)  Tobacco Use: High Risk (11/01/2022)    Readmission Risk Interventions     No data to display

## 2022-11-06 ENCOUNTER — Inpatient Hospital Stay: Payer: Medicare Other | Admitting: Anesthesiology

## 2022-11-06 ENCOUNTER — Encounter: Payer: Self-pay | Admitting: Internal Medicine

## 2022-11-06 ENCOUNTER — Encounter
Admission: EM | Disposition: A | Discharge status: Nursing Home (Includes ICF) | Payer: Self-pay | Source: Skilled Nursing Facility | Attending: Family Medicine

## 2022-11-06 DIAGNOSIS — R109 Unspecified abdominal pain: Secondary | ICD-10-CM | POA: Diagnosis not present

## 2022-11-06 DIAGNOSIS — D500 Iron deficiency anemia secondary to blood loss (chronic): Secondary | ICD-10-CM | POA: Diagnosis not present

## 2022-11-06 DIAGNOSIS — D649 Anemia, unspecified: Secondary | ICD-10-CM | POA: Diagnosis not present

## 2022-11-06 DIAGNOSIS — K259 Gastric ulcer, unspecified as acute or chronic, without hemorrhage or perforation: Secondary | ICD-10-CM | POA: Insufficient documentation

## 2022-11-06 DIAGNOSIS — I70219 Atherosclerosis of native arteries of extremities with intermittent claudication, unspecified extremity: Secondary | ICD-10-CM | POA: Diagnosis not present

## 2022-11-06 HISTORY — PX: COLONOSCOPY WITH PROPOFOL: SHX5780

## 2022-11-06 HISTORY — PX: ESOPHAGOGASTRODUODENOSCOPY (EGD) WITH PROPOFOL: SHX5813

## 2022-11-06 LAB — HEPATIC FUNCTION PANEL
ALT: 10 U/L (ref 0–44)
AST: 11 U/L — ABNORMAL LOW (ref 15–41)
Albumin: 2.3 g/dL — ABNORMAL LOW (ref 3.5–5.0)
Alkaline Phosphatase: 61 U/L (ref 38–126)
Bilirubin, Direct: 0.1 mg/dL (ref 0.0–0.2)
Indirect Bilirubin: 0.6 mg/dL (ref 0.3–0.9)
Total Bilirubin: 0.7 mg/dL (ref 0.3–1.2)
Total Protein: 5.4 g/dL — ABNORMAL LOW (ref 6.5–8.1)

## 2022-11-06 LAB — BASIC METABOLIC PANEL
Anion gap: 5 (ref 5–15)
BUN: 24 mg/dL — ABNORMAL HIGH (ref 8–23)
CO2: 23 mmol/L (ref 22–32)
Calcium: 8.2 mg/dL — ABNORMAL LOW (ref 8.9–10.3)
Chloride: 111 mmol/L (ref 98–111)
Creatinine, Ser: 1.24 mg/dL (ref 0.61–1.24)
GFR, Estimated: 60 mL/min — ABNORMAL LOW (ref >60)
Glucose, Bld: 119 mg/dL — ABNORMAL HIGH (ref 70–99)
Potassium: 4.4 mmol/L (ref 3.5–5.1)
Sodium: 139 mmol/L (ref 135–145)

## 2022-11-06 LAB — CBC
HCT: 27.4 % — ABNORMAL LOW (ref 39.0–52.0)
Hemoglobin: 8.2 g/dL — ABNORMAL LOW (ref 13.0–17.0)
MCH: 25.9 pg — ABNORMAL LOW (ref 26.0–34.0)
MCHC: 29.9 g/dL — ABNORMAL LOW (ref 30.0–36.0)
MCV: 86.4 fL (ref 80.0–100.0)
Platelets: 315 10*3/uL (ref 150–400)
RBC: 3.17 MIL/uL — ABNORMAL LOW (ref 4.22–5.81)
RDW: 17.2 % — ABNORMAL HIGH (ref 11.5–15.5)
WBC: 10.5 10*3/uL (ref 4.0–10.5)
nRBC: 0 % (ref 0.0–0.2)

## 2022-11-06 SURGERY — ESOPHAGOGASTRODUODENOSCOPY (EGD) WITH PROPOFOL
Anesthesia: General

## 2022-11-06 MED ORDER — SODIUM CHLORIDE 0.9 % IV SOLN: INTRAVENOUS | Status: DC | PRN

## 2022-11-06 MED ORDER — LIDOCAINE HCL (CARDIAC) PF 100 MG/5ML IV SOSY
PREFILLED_SYRINGE | INTRAVENOUS | Status: DC | PRN
  Administered 2022-11-06: 40 mg via INTRAVENOUS

## 2022-11-06 MED ORDER — PANTOPRAZOLE SODIUM 40 MG PO TBEC
40.0000 mg | DELAYED_RELEASE_TABLET | Freq: Every day | ORAL | Status: DC
  Administered 2022-11-08 – 2022-11-13 (×6): 40 mg via ORAL
  Filled 2022-11-06 (×6): qty 1

## 2022-11-06 MED ORDER — NICOTINE 7 MG/24HR TD PT24
7.0000 mg | MEDICATED_PATCH | Freq: Every day | TRANSDERMAL | Status: DC
  Administered 2022-11-06 – 2022-11-14 (×9): 7 mg via TRANSDERMAL
  Filled 2022-11-06 (×10): qty 1

## 2022-11-06 MED ORDER — PROPOFOL 10 MG/ML IV BOLUS
INTRAVENOUS | Status: DC | PRN
  Administered 2022-11-06: 50 mg via INTRAVENOUS

## 2022-11-06 MED ORDER — PHENYLEPHRINE HCL (PRESSORS) 10 MG/ML IV SOLN
INTRAVENOUS | Status: DC | PRN
  Administered 2022-11-06 (×2): 80 ug via INTRAVENOUS

## 2022-11-06 MED ORDER — PHENYLEPHRINE 80 MCG/ML (10ML) SYRINGE FOR IV PUSH (FOR BLOOD PRESSURE SUPPORT)
PREFILLED_SYRINGE | INTRAVENOUS | Status: DC | PRN
  Administered 2022-11-06: 80 ug via INTRAVENOUS

## 2022-11-06 MED ORDER — SODIUM CHLORIDE 0.9 % IV SOLN: Freq: Once | INTRAVENOUS | Status: AC

## 2022-11-06 NOTE — Anesthesia Preprocedure Evaluation (Addendum)
Anesthesia Evaluation  Patient identified by MRN, date of birth, ID band Patient awake    Reviewed: Allergy & Precautions, NPO status , Patient's Chart, lab work & pertinent test results  Airway Mallampati: III  TM Distance: >3 FB Neck ROM: full    Dental  (+) Edentulous Upper, Edentulous Lower   Pulmonary COPD, Current Smoker and Patient abstained from smoking.   Pulmonary exam normal        Cardiovascular hypertension, + CAD, + Cardiac Stents, + CABG, + Peripheral Vascular Disease and +CHF (HFrEF)  Normal cardiovascular exam+ Valvular Problems/Murmurs (prosthetic aortic valve)      Neuro/Psych  PSYCHIATRIC DISORDERS      negative neurological ROS     GI/Hepatic Neg liver ROS,GERD  Controlled,,  Endo/Other  diabetes, Type 2    Renal/GU Renal disease  negative genitourinary   Musculoskeletal   Abdominal Normal abdominal exam  (+)   Peds  Hematology  (+) Blood dyscrasia, anemia   Anesthesia Other Findings S/p pRBC 1/10-12  Past Medical History: No date: Aortic stenosis     Comment:  a. Pt unaware of history but CT imaging 01/2019               consistent w/ AVR; b. 01/2019 Echo: Mild to mod AS. Mean               grad 43mHg. No date: CAD (coronary artery disease)     Comment:  a. 1994 [place]; b. Reports multiple               stress tests over the years w/o repeat cath; c. 01/2019               MV: large, sev, fixed inf and inflat defect extending to               apex. No ischemia. EF 37%-->Med rx given atypical Ss and               pt wishes. No date: CKD (chronic kidney disease), stage III (HCC) No date: COPD (chronic obstructive pulmonary disease) (HCC) No date: Depression No date: Essential hypertension No date: GERD (gastroesophageal reflux disease) No date: Hyperlipidemia LDL goal <70 No date: Ischemic cardiomyopathy     Comment:  a. 01/2019 Echo: EF 40-45%, impaired relaxation. Mildly                dil LA. Sev mitral annular Ca2+. Mild to mod AS. No date: PAD (peripheral artery disease) ([place]     Comment:  a. 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 No date: PAF (paroxysmal atrial fibrillation) (HCC)     Comment:  a. CHA2DS2VASc = 4-->Xarelto '15mg'$  daily in setting of               CKD. No date: Thyrotoxicosis No date: Tobacco abuse No date: Type II diabetes mellitus ([place]  Past Surgical History: No date: CARDIAC CATHETERIZATION No date: CHOLECYSTECTOMY No date: COLONOSCOPY No date: CORONARY ARTERY BYPASS GRAFT     Comment:  a. 1[place]12/29/2021: ENDOSCOPIC RETROGRADE CHOLANGIOPANCREATOGRAPHY (ERCP)  WITH PROPOFOL; N/A     Comment:  Procedure: ENDOSCOPIC RETROGRADE               CHOLANGIOPANCREATOGRAPHY (ERCP) WITH PROPOFOL;  Surgeon:               WLucilla Lame MD;  Location: [facility] ENDOSCOPY;  Service:  Endoscopy;  Laterality: N/A; 12/29/2020: ERCP; N/A     Comment:  Procedure: ENDOSCOPIC RETROGRADE               CHOLANGIOPANCREATOGRAPHY (ERCP);  Surgeon: [name],               MD;  Location: [facility] ENDOSCOPY;  Service: Endoscopy;                Laterality: N/A; 07/18/2021: ESOPHAGOGASTRODUODENOSCOPY (EGD) WITH PROPOFOL; N/A     Comment:  Procedure: ESOPHAGOGASTRODUODENOSCOPY (EGD) WITH               PROPOFOL;  Surgeon: Lin Landsman, MD;  Location:               [facility] ENDOSCOPY;  Service: Gastroenterology;  Laterality:               N/A; No date: Left AKA 06/06/2020: RIGHT/LEFT HEART CATH AND CORONARY ANGIOGRAPHY; N/A     Comment:  Procedure: RIGHT/LEFT HEART CATH AND CORONARY               ANGIOGRAPHY;  Surgeon: Wellington Hampshire, MD;  Location:               [facility];  Service: Cardiovascular;                Laterality: N/A;  BMI    Body Mass Index: 20.03 kg/m      Reproductive/Obstetrics negative OB ROS                             Anesthesia Physical Anesthesia Plan  ASA: 3  Anesthesia Plan: General    Post-op Pain Management:    Induction: Intravenous  PONV Risk Score and Plan: Propofol infusion and TIVA  Airway Management Planned: Natural Airway and Nasal Cannula  Additional Equipment:   Intra-op Plan:   Post-operative Plan:   Informed Consent: I have reviewed the patients History and Physical, chart, labs and discussed the procedure including the risks, benefits and alternatives for the proposed anesthesia with the patient or authorized representative who has indicated his/her understanding and acceptance.   Patient has DNR.   Dental Advisory Given  Plan Discussed with: Anesthesiologist, CRNA and Surgeon  Anesthesia Plan Comments: (Pre-op Interview not performed yet. )        Anesthesia Quick Evaluation

## 2022-11-06 NOTE — Plan of Care (Signed)
  Problem: Education: Goal: Knowledge of General Education information will improve Description: Including pain rating scale, medication(s)/side effects and non-pharmacologic comfort measures Outcome: Progressing   Problem: Health Behavior/Discharge Planning: Goal: Ability to manage health-related needs will improve Outcome: Progressing   Problem: Clinical Measurements: Goal: Ability to maintain clinical measurements within normal limits will improve Outcome: Progressing   Problem: Activity: Goal: Risk for activity intolerance will decrease Outcome: Progressing   Problem: Nutrition: Goal: Adequate nutrition will be maintained Outcome: Progressing   Problem: Pain Managment: Goal: General experience of comfort will improve Outcome: Progressing   Problem: Skin Integrity: Goal: Risk for impaired skin integrity will decrease Outcome: Progressing

## 2022-11-06 NOTE — Progress Notes (Addendum)
PROGRESS NOTE    Shawn Jennings   WJX:[id number] DOB: [birth year]  DOA: 10/31/2022 Date of Service: 11/06/22 PCP: Merryl Hacker, No     Brief Narrative / Hospital Course:  Shawn Jennings is a 77 y.o. 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  Patient reports that since morning 10/31/21. mild epigastric pain that is "annoying" no radiation no aggravating or relieving factors.  No associated diarrhea or vomiting or fevers.  01/10: ED course - Hgb 6.3, received 1 unit PRBC in ED. Retic ct high at 6.4. CTA GI bleed No appreciable active GI bleeding noted.  01/11: Hgb still low at 6.8, ordered another unit blood. 01/12: Anticipate EGD/colonoscopy Monday, today is Friday.  Patient needs to be off Eliquis 2 days and hemoglobin over 7.  Low BP today, discontinue the rest of his antihypertensives.  Hgb down to 7.5, ordered another unit PRBC (3rd unit thus far). 01/13: Hgb maintaining at 8.5, 8.4  01/14: Hgb maintaining at 8.6, 8.6. Later in day refusing bowel prep 01/15: repeat bowel prep today, EGD/colonoscopy tomorrow  01/16: EGD/colonoscopy today.  SNF pending.  TOC consulted to reach out to DSS regarding guardianship given patient's limited capacity. EGD (+)gastric ulcer nonbleeding  Consultants:  Gastroenterology   Procedures: EGD 11/06/22 (+)gastric ulcer       ASSESSMENT & PLAN:   Principal Problem:   Anemia Active Problems:   Essential hypertension   HFrEF (heart failure with reduced ejection fraction) (HCC)   Stage 3a chronic kidney disease (CKD) (HCC)   Coronary artery disease of native artery of native heart with stable angina pectoris (HCC)   Ischemic cardiomyopathy   PAF (paroxysmal atrial fibrillation) (HCC)   On anticoagulant therapy   Depression   GERD (gastroesophageal reflux  disease)   Hyperlipidemia   Abdominal pain   Acute blood loss anemia   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  Patient needs to be off Eliquis 2 days and hemoglobin over 7 - these goals have been met  Plan was for EGD/colonoscopy for 01/15  however patient was refusing prep...  EGD 01/16 (+)gastric ulcer Colonoscopy per GI  Trend CBC  Essential hypertension with current hypotension likely volume loss related  HFrEF (heart failure with reduced ejection fraction) (HCC) not in exacerbation Coronary artery disease of native artery of native heart with stable angina pectoris (HCC) Nonsustained VTach x5 beats 11/04/22  Ischemic cardiomyopathy PAF (paroxysmal atrial fibrillation) (HCC) on anticoagulant therapy Hyperlipidemia Atherosclerosis 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 (HCC) BP soft on this admission, concerning for hypovolemia d/t anemia  Holding home medications: Initially held Eliquis, Entresto, Lasix; also holding spironolactone, isosorbide, beta-blocker due to low blood pressure monitor VS for BP of course, rebound tachycardia or afib/RVR while off rate control meds, restart beta blocker as able  but HR has been WNL NSVT no symptoms 11/04/22, 12-lead EKG ok   Stage 3a chronic kidney disease (CKD) (HCC) Metabolic acidosis Bicarb Treat underlying  anemia monitor BMP   Cognitive impairment Memory deficit  Patient has some limitations in his decision-making capacity, but I believe he understands the basics of the risks versus benefits of EGD/colonoscopy to determine etiology of anemia and to evaluate for GI bleed.  This morning, he states "I want to get the procedure done so that I can feel better and go home," he states he is not sure what procedure he is having.  I explained to him that we are wanting to do an EGD/colonoscopy since his blood counts were low and we need to look for bleeding.  He says okay.  We talked about other things for a while, I asked him a bit later, what procedures we are trying to do and why, he can repeat that "we are going into the stomach with a camera and into the colon with a camera to look for blood."  Given that this procedure is medically necessary, that benefits outweigh risks, and that patient has no other available next of kin or legal guardian/surrogate decision maker to decide for him, and cooperation with another physician (Dr. Allen Norris, GI service) per [place] statute noted below, I am willing to make formal medical decisions on this patient's behalf.  Have consulted TOC regarding getting him set up with DSS guardian     Other chronic medical conditions, stable: Depression -continue home lamotrigine, duloxetine 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[place]) -consider PT/OT when able Chronic obstructive pulmonary disease, unspecified ([place]) -continue home meds   DVT prophylaxis: none at this time Pertinent IV fluids/nutrition: no continuous IV fluids. NPO p mn.  Central lines / invasive devices: none  Code Status: FULL CODE   Current Admission Status: inpatient  TOC needs / Dispo plan: none at this time, anticipate dc back to previous home environment +/- HH. Will ask PT/OT to eval after scope  Barriers to discharge / significant pending items: GI eval for bleeding - EGD/colonoscopy tomorrow see above.              Subjective / Brief ROS:  Patient reports feeling okay today Denies chest pain/shortness of breath, denies bleeding  Family Communication: pt declined call to family/support person(s) -he states he does not have anyone designated to make decisions for him, no family.  When I asked what  does would happen if he were unconscious and could not talk to [place] to make decisions, he states "my facility helps with that kind of thing."    Objective Findings:  Vitals:   11/06/22 0739 11/06/22 1245 11/06/22 1255 11/06/22 1305  BP: 109/70 (!) 101/55 (!) 102/55 120/64  Pulse: 67     Resp: '20 19 17 20  '$ Temp: 98.5 F (36.9 C)     TempSrc:      SpO2: 96% 100% 100% 100%  Weight:      Height:        Intake/Output Summary (Last 24 hours) at 11/06/2022 1454 Last data filed at 11/06/2022 1245 Gross per 24 hour  Intake 800 ml  Output 1402 ml  Net -602 ml   Filed Weights   10/31/22 1720  Weight: 73 kg    Examination:  Physical Exam Constitutional:      General: He is not in acute distress.    Appearance: He is ill-appearing.  Cardiovascular:     Rate and Rhythm:  Normal rate and regular rhythm.     Heart sounds: Murmur heard.     Systolic murmur is present.  Pulmonary:     Effort: Pulmonary effort is normal. No respiratory distress.     Breath sounds: Normal breath sounds.  Abdominal:     Palpations: Abdomen is soft.  Musculoskeletal:     Right lower leg: No edema.  Skin:    General: Skin is warm and dry.  Neurological:     General: No focal deficit present.     Mental Status: He is alert.  Psychiatric:        Mood and Affect: Mood normal.        Behavior: Behavior normal.          Scheduled Medications:   atorvastatin  80 mg Oral QHS   cholecalciferol  1,000 Units Oral Daily   cyanocobalamin  1,000 mcg Oral Daily   DULoxetine  30 mg Oral BID   ferrous sulfate  325 mg Oral QODAY   lamoTRIgine  25 mg Oral BID   loratadine  10 mg Oral Daily   melatonin   5 mg Oral QHS   montelukast  10 mg Oral QHS   pantoprazole  40 mg Oral Daily   sodium bicarbonate  650 mg Oral TID   sodium chloride flush  3 mL Intravenous Q12H    Continuous Infusions:    PRN Medications:  acetaminophen, albuterol, alum & mag hydroxide-simeth, hydrOXYzine, nitroGLYCERIN, oxyCODONE  Antimicrobials from admission:  Anti-infectives (From admission, onward)    Start     Dose/Rate Route Frequency Ordered Stop   11/01/22 2300  azithromycin (ZITHROMAX) 500 mg in sodium chloride 0.9 % 250 mL IVPB  Status:  Discontinued        500 mg 250 mL/hr over 60 Minutes Intravenous Every 24 hours 11/01/22 0107 11/01/22 0748   11/01/22 2200  cefTRIAXone (ROCEPHIN) 1 g in sodium chloride 0.9 % 100 mL IVPB  Status:  Discontinued        1 g 200 mL/hr over 30 Minutes Intravenous Every 24 hours 11/01/22 0107 11/01/22 0748   11/01/22 0100  cefTRIAXone (ROCEPHIN) 1 g in sodium chloride 0.9 % 100 mL IVPB  Status:  Discontinued        1 g 200 mL/hr over 30 Minutes Intravenous Every 24 hours 11/01/22 0056 11/01/22 0107   11/01/22 0100  azithromycin (ZITHROMAX) 500 mg in sodium chloride 0.9 % 250 mL IVPB  Status:  Discontinued        500 mg 250 mL/hr over 60 Minutes Intravenous Every 24 hours 11/01/22 0056 11/01/22 0107   10/31/22 2115  cefTRIAXone (ROCEPHIN) 1 g in sodium chloride 0.9 % 100 mL IVPB        1 g 200 mL/hr over 30 Minutes Intravenous  Once 10/31/22 2111 10/31/22 2333   10/31/22 2115  azithromycin (ZITHROMAX) 500 mg in sodium chloride 0.9 % 250 mL IVPB        500 mg 250 mL/hr over 60 Minutes Intravenous  Once 10/31/22 2111 11/01/22 0055           Data Reviewed:  I have personally reviewed the following...  CBC: Recent Labs  Lab 11/01/22 0750 11/01/22 2111 11/02/22 0734 11/02/22 1258 11/03/22 0625 11/03/22 1656 11/04/22 0451 11/04/22 1622 11/05/22 0506 11/06/22 0402  WBC 16.1*  --  13.8*  --  12.2*  --   --   --  11.8* 10.5  HGB 6.4*   < > 7.7*   < >  8.4*  8.6* 8.6* 8.8* 8.7* 8.2*  HCT 21.8*   < > 25.0*   < > 27.4* 28.6* 28.2* 29.5* 29.2* 27.4*  MCV 86.2  --  83.6  --  84.8  --   --   --  85.9 86.4  PLT 332  --  330  --  295  --   --   --  309 315   < > = values in this interval not displayed.   Basic Metabolic Panel: Recent Labs  Lab 11/01/22 0450 11/01/22 0750 11/02/22 0734 11/03/22 0625 11/05/22 0506 11/06/22 0402  NA  --  139 140 142 140 139  K  --  4.1 4.0 3.9 4.2 4.4  CL  --  114* 115* 116* 112* 111  CO2  --  19* 18* 19* 22 23  GLUCOSE  --  123* 136* 132* 127* 119*  BUN  --  26* 26* 27* 23 24*  CREATININE  --  1.51* 1.45* 1.41* 1.22 1.24  CALCIUM  --  7.8* 7.9* 8.1* 7.9* 8.2*  PHOS 3.4  --   --   --   --   --    GFR: Estimated Creatinine Clearance: 50.7 mL/min (by C-G formula based on SCr of 1.24 mg/dL). Liver Function Tests: Recent Labs  Lab 10/31/22 1934 11/06/22 0402  AST 15 11*  ALT 9 10  ALKPHOS 80 61  BILITOT 0.7 0.7  PROT 6.5 5.4*  ALBUMIN 3.1* 2.3*   Recent Labs  Lab 10/31/22 2344  LIPASE 90*   No results for input(s): "AMMONIA" in the last 168 hours. Coagulation Profile: Recent Labs  Lab 11/01/22 0450  INR 1.5*   Cardiac Enzymes: No results for input(s): "CKTOTAL", "CKMB", "CKMBINDEX", "TROPONINI" in the last 168 hours. BNP (last 3 results) No results for input(s): "PROBNP" in the last 8760 hours. HbA1C: No results for input(s): "HGBA1C" in the last 72 hours. CBG: No results for input(s): "GLUCAP" in the last 168 hours. Lipid Profile: No results for input(s): "CHOL", "HDL", "LDLCALC", "TRIG", "CHOLHDL", "LDLDIRECT" in the last 72 hours. Thyroid Function Tests: No results for input(s): "TSH", "T4TOTAL", "FREET4", "T3FREE", "THYROIDAB" in the last 72 hours. Anemia Panel: No results for input(s): "VITAMINB12", "FOLATE", "FERRITIN", "TIBC", "IRON", "RETICCTPCT" in the last 72 hours.  Most Recent Urinalysis On File:     Component Value Date/Time   COLORURINE STRAW (A) 07/15/2021 1839    APPEARANCEUR CLEAR (A) 07/15/2021 1839   LABSPEC 1.014 07/15/2021 1839   PHURINE 5.0 07/15/2021 1839   GLUCOSEU 150 (A) 07/15/2021 1839   HGBUR NEGATIVE 07/15/2021 1839   BILIRUBINUR NEGATIVE 07/15/2021 1839   KETONESUR NEGATIVE 07/15/2021 1839   PROTEINUR NEGATIVE 07/15/2021 1839   NITRITE NEGATIVE 07/15/2021 1839   LEUKOCYTESUR NEGATIVE 07/15/2021 1839   Sepsis Labs: '@LABRCNTIP'$ (procalcitonin:4,lacticidven:4) Microbiology: No results found for this or any previous visit (from the past 240 hour(s)).    Radiology Studies last 3 days: No results found.      APPENDIX:  See: [place] 90. Medicine and Allied Occupations  90-21.13. Informed consent to health care treatment or procedure. In brief summary, this statute outlines that the following persons, in order indicated, are authorized to consent to medical treatment on behalf of the patient who does not demonstrate capacity to do so: Legally assigned guardian appointed by the court > healthcare agent appointed by legal healthcare power of attorney > healthcare agent appointed by the patient > legal spouse > majority of the patient's reasonably available parents and  children who are at least 61 years of age > individual who has an established relationship with the patient, who is acting in good faith on behalf of the patient, and who can reliably convey the patient's wishes > attending physician with confirmation by another physician of the patient's condition and the necessity for treatment (unless in urgent/emergent situation wherein one physician may act unilaterally)       LOS: 5 days      Emeterio Reeve, DO [facility] 11/06/2022, 2:54 PM    Dictation software may have been used to generate the above note. Typos may occur and escape review in typed/dictated notes. Please contact Dr Sheppard Coil directly for clarity if needed.  Staff may message me via secure chat in [place]  but this  may not receive an immediate response,  please page me for urgent matters!  If 7PM-7AM, please contact night coverage [email or web address]

## 2022-11-06 NOTE — Care Management Important Message (Signed)
Important Message  Patient Details  Name: Shawn Jennings MRN: [id number] Date of Birth: February 05, 1946   Medicare Important Message Given:  Yes     Juliann Pulse A Kamon Fahr 11/06/2022, 10:30 AM

## 2022-11-06 NOTE — TOC Progression Note (Signed)
Transition of Care [facility]) - Progression Note    Patient Details  Name: Shawn Jennings MRN: [id number] Date of Birth: 1946/02/19  Transition of Care [facility]) CM/SW Contact  Gerilyn Pilgrim, LCSW Phone Number: 11/06/2022, 2:37 PM  Clinical Narrative:   Second voicemail left for Anderson Malta at [place] regarding patient.          Expected Discharge Plan and Services                                               Social Determinants of Health (SDOH) Interventions SDOH Screenings   Food Insecurity: No Food Insecurity (11/01/2022)  Housing: Low Risk  (11/01/2022)  Transportation Needs: No Transportation Needs (11/01/2022)  Utilities: Not At Risk (11/01/2022)  Depression (PHQ2-9): Low Risk  (06/11/2022)  Tobacco Use: High Risk (11/06/2022)    Readmission Risk Interventions     No data to display

## 2022-11-06 NOTE — Transfer of Care (Signed)
Immediate Anesthesia Transfer of Care Note  Patient: Shawn Jennings  Procedure(s) Performed: ESOPHAGOGASTRODUODENOSCOPY (EGD) WITH PROPOFOL COLONOSCOPY WITH PROPOFOL  Patient Location: PACU  Anesthesia Type:General  Level of Consciousness: drowsy and patient cooperative  Airway & Oxygen Therapy: Patient Spontanous Breathing and Patient connected to nasal cannula oxygen  Post-op Assessment: Report given to RN and Post -op Vital signs reviewed and stable  Post vital signs: Reviewed and stable  Last Vitals:  Vitals Value Taken Time  BP 101/55 11/06/22 1245  Temp    Pulse    Resp 19 11/06/22 1245  SpO2 100 % 11/06/22 1245    Last Pain:  Vitals:   11/05/22 2001  TempSrc:   PainSc: 0-No pain         Complications: No notable events documented.

## 2022-11-06 NOTE — Op Note (Signed)
[facility] Patient Name: Shawn Jennings Procedure Date: 11/06/2022 11:25 AM MRN: [id number] Account #: [id number] Date of Birth: Feb 28, 1946 Admit Type: Outpatient Age: 77 Room: [facility] ENDO ROOM 4 Gender: Male Note Status: Finalized Instrument Name: Upper Endoscope [id number] Procedure:             Upper GI endoscopy Indications:           Iron deficiency anemia Providers:             [name] MD, MD Medicines:             Propofol per Anesthesia Complications:         No immediate complications. Procedure:             Pre-Anesthesia Assessment:                        - Prior to the procedure, a History and Physical was                         performed, and patient medications and allergies were                         reviewed. The patient's tolerance of previous                         anesthesia was also reviewed. The risks and benefits                         of the procedure and the sedation options and risks                         were discussed with the patient. All questions were                         answered, and informed consent was obtained. Prior                         Anticoagulants: The patient has taken no anticoagulant                         or antiplatelet agents. ASA Grade Assessment: III - A                         patient with severe systemic disease. After reviewing                         the risks and benefits, the patient was deemed in                         satisfactory condition to undergo the procedure.                        After obtaining informed consent, the endoscope was                         passed under direct vision. Throughout the procedure,                         the patient's  blood pressure, pulse, and oxygen                         saturations were monitored continuously. The Endoscope                         was introduced through the mouth, and advanced to the                         second part  of duodenum. The upper GI endoscopy was                         accomplished without difficulty. The patient tolerated                         the procedure well. Findings:      The examined esophagus was normal.      One non-bleeding cratered gastric ulcer with no stigmata of bleeding was       found in the gastric antrum.      The examined duodenum was normal. Impression:            - Normal esophagus.                        - Non-bleeding gastric ulcer with no stigmata of                         bleeding.                        - Normal examined duodenum.                        - No specimens collected. Recommendation:        - Return patient to hospital ward for ongoing care.                        - Resume previous diet.                        - Continue present medications.                        - Patient unlikely to do a prep after offeed a prep                         twice. Procedure Code(s):     --- Professional ---                        [id number], Esophagogastroduodenoscopy, flexible,                         transoral; diagnostic, including collection of                         specimen(s) by brushing or washing, when performed                         (separate procedure) Diagnosis Code(s):     --- Professional ---  D50.9, Iron deficiency anemia, unspecified                        K25.9, Gastric ulcer, unspecified as acute or chronic,                         without hemorrhage or perforation CPT copyright 2022 American [employer] Association. All rights reserved. The codes documented in this report are preliminary and upon coder review may  be revised to meet current compliance requirements. [name] MD, MD 11/06/2022 12:33:20 PM This report has been signed electronically. Number of Addenda: 0 Note Initiated On: 11/06/2022 11:25 AM Estimated Blood Loss:  Estimated blood loss: none.      [facility]

## 2022-11-07 ENCOUNTER — Inpatient Hospital Stay: Payer: Medicare Other

## 2022-11-07 ENCOUNTER — Encounter: Payer: Self-pay | Admitting: Gastroenterology

## 2022-11-07 DIAGNOSIS — D649 Anemia, unspecified: Secondary | ICD-10-CM | POA: Diagnosis not present

## 2022-11-07 LAB — BLOOD GAS, VENOUS
Acid-base deficit: 0.5 mmol/L (ref 0.0–2.0)
Bicarbonate: 24.2 mmol/L (ref 20.0–28.0)
O2 Saturation: 57.3 %
Patient temperature: 37
pCO2, Ven: 39 mmHg — ABNORMAL LOW (ref 44–60)
pH, Ven: 7.4 (ref 7.25–7.43)
pO2, Ven: 36 mmHg (ref 32–45)

## 2022-11-07 LAB — CBC WITH DIFFERENTIAL/PLATELET
Abs Immature Granulocytes: 0.05 10*3/uL (ref 0.00–0.07)
Basophils Absolute: 0.1 10*3/uL (ref 0.0–0.1)
Basophils Relative: 1 %
Eosinophils Absolute: 0.6 10*3/uL — ABNORMAL HIGH (ref 0.0–0.5)
Eosinophils Relative: 6 %
HCT: 26.1 % — ABNORMAL LOW (ref 39.0–52.0)
Hemoglobin: 7.7 g/dL — ABNORMAL LOW (ref 13.0–17.0)
Immature Granulocytes: 1 %
Lymphocytes Relative: 17 %
Lymphs Abs: 1.5 10*3/uL (ref 0.7–4.0)
MCH: 25.3 pg — ABNORMAL LOW (ref 26.0–34.0)
MCHC: 29.5 g/dL — ABNORMAL LOW (ref 30.0–36.0)
MCV: 85.9 fL (ref 80.0–100.0)
Monocytes Absolute: 0.6 10*3/uL (ref 0.1–1.0)
Monocytes Relative: 7 %
Neutro Abs: 6.1 10*3/uL (ref 1.7–7.7)
Neutrophils Relative %: 68 %
Platelets: 298 10*3/uL (ref 150–400)
RBC: 3.04 MIL/uL — ABNORMAL LOW (ref 4.22–5.81)
RDW: 16.7 % — ABNORMAL HIGH (ref 11.5–15.5)
WBC: 8.9 10*3/uL (ref 4.0–10.5)
nRBC: 0 % (ref 0.0–0.2)

## 2022-11-07 LAB — CBC
HCT: 24.8 % — ABNORMAL LOW (ref 39.0–52.0)
Hemoglobin: 7.4 g/dL — ABNORMAL LOW (ref 13.0–17.0)
MCH: 25.7 pg — ABNORMAL LOW (ref 26.0–34.0)
MCHC: 29.8 g/dL — ABNORMAL LOW (ref 30.0–36.0)
MCV: 86.1 fL (ref 80.0–100.0)
Platelets: 301 10*3/uL (ref 150–400)
RBC: 2.88 MIL/uL — ABNORMAL LOW (ref 4.22–5.81)
RDW: 16.7 % — ABNORMAL HIGH (ref 11.5–15.5)
WBC: 9.8 10*3/uL (ref 4.0–10.5)
nRBC: 0 % (ref 0.0–0.2)

## 2022-11-07 LAB — BASIC METABOLIC PANEL
Anion gap: 7 (ref 5–15)
Anion gap: 8 (ref 5–15)
BUN: 24 mg/dL — ABNORMAL HIGH (ref 8–23)
BUN: 24 mg/dL — ABNORMAL HIGH (ref 8–23)
CO2: 20 mmol/L — ABNORMAL LOW (ref 22–32)
CO2: 21 mmol/L — ABNORMAL LOW (ref 22–32)
Calcium: 8 mg/dL — ABNORMAL LOW (ref 8.9–10.3)
Calcium: 8 mg/dL — ABNORMAL LOW (ref 8.9–10.3)
Chloride: 111 mmol/L (ref 98–111)
Chloride: 109 mmol/L (ref 98–111)
Creatinine, Ser: 1.19 mg/dL (ref 0.61–1.24)
Creatinine, Ser: 1.3 mg/dL — ABNORMAL HIGH (ref 0.61–1.24)
GFR, Estimated: >60 mL/min (ref >60)
GFR, Estimated: 56 mL/min — ABNORMAL LOW (ref >60)
Glucose, Bld: 126 mg/dL — ABNORMAL HIGH (ref 70–99)
Glucose, Bld: 129 mg/dL — ABNORMAL HIGH (ref 70–99)
Potassium: 4.2 mmol/L (ref 3.5–5.1)
Potassium: 4.1 mmol/L (ref 3.5–5.1)
Sodium: 138 mmol/L (ref 135–145)
Sodium: 138 mmol/L (ref 135–145)

## 2022-11-07 LAB — MAGNESIUM: Magnesium: 2.2 mg/dL (ref 1.7–2.4)

## 2022-11-07 LAB — LACTIC ACID, PLASMA: Lactic Acid, Venous: 1.3 mmol/L (ref 0.5–1.9)

## 2022-11-07 LAB — TROPONIN I (HIGH SENSITIVITY): Troponin I (High Sensitivity): 25 ng/L — ABNORMAL HIGH (ref <18)

## 2022-11-07 LAB — AMMONIA: Ammonia: 17 umol/L (ref 9–35)

## 2022-11-07 LAB — GLUCOSE, CAPILLARY
Glucose-Capillary: 129 mg/dL — ABNORMAL HIGH (ref 70–99)
Glucose-Capillary: 230 mg/dL — ABNORMAL HIGH (ref 70–99)

## 2022-11-07 NOTE — Significant Event (Signed)
Rapid Response Event Note   Reason for Call :  Unresponsiveness  Initial Focused Assessment:   Per Bedside RN- patient not responding to sternal rub.  Vitals and blood sugar stable. Dr. Dwyane Dee at bedside unable to get pt to respond to vigorous sternal rub and voice.      Interventions:  Dr. Dwyane Dee ordered labs, EKG, and stat CT of head. Also ordered patient to transfer to SD for management.  Plan of Care:  After patient was brought to stepdown and move onto the ICU- patient opens his eyes asking were he was at.  Patient alert and oriented and vitals stable- patient laughing to the situation of him not waking up- and stated if he would have some coffee.  Updated Dr. Dwyane Dee who discontinued the transfer order to ICU and stated that patient can move back to his room to 119.  Event Summary:   MD Notified: Dr. Dwyane Dee Call Time: Arrival Time:0759 End Time:0900  Reggie Pile, RN

## 2022-11-07 NOTE — Progress Notes (Signed)
The patient has anemia without any gross sign of bleeding with an EGD yesterday. See EGD note.  The patient has been given multiple attempts to take a prep and has refused and then been none compliant.  CT angio with no source seen.  If any gross bleeding then repeat CT angio. Transfuse as needed Not much more to be offered from a GI point of view at this point.  Please call if any further questions.

## 2022-11-07 NOTE — Care Plan (Signed)
   BRIEF PCCM NOTE   BRIEF PT DESCRIPTION / SYNOPSIS :  77 y.o. 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  Patient underwent EGD on 11/06/2022 showed nonbleeding gastric ulcer, did not have preparation for colonoscopy.  On 1/17 had brief episode of unresponsiveness/encephalopathy which has self resolved.  SUBJECTIVE / INTERVAL HISTORY :  -This morning pt was noted by nursing staff to be unresponsive (even to sternal rub) -Hemodynamically stable, protecting his airway -CT Head obtained which is negative, and VBG normal -Upon arrival to [facility], pt is awake and alert, conversing, answering questions appropriately    OBJECTIVE :   Today's Vitals   11/07/22 0755 11/07/22 0758 11/07/22 0933 11/07/22 1147  BP: 110/73 116/62 108/64 (!) 101/58  Pulse: 83 73 71 74  Resp: '20 16 16 17  '$ Temp: 98.2 F (36.8 C)  98.7 F (37.1 C) 98 F (36.7 C)  TempSrc:   Oral   SpO2: 92% 98% 100% 100%  Weight:      Height:      PainSc:       Body mass index is 21.84 kg/m.   ASSESSMENT / PLAN :   Acute Metabolic Encephalopathy / Unresponsiveness ~ RESOLVED PMHx: Cognitive Impairment/memory deficit, depression -Provide supportive care -Promote normal sleep/wake cycle and family presence -Avoid sedating meds as able -CT Head negative for acute intracranial abnormality -VBG without hypercapnia (7.4/39/36/24.2), ammonia normal at 17, no severe metabolic derangements -Consider EEG     Unresponsiveness/encephalopathy has resolved.  Pt is awake and alert, protecting his airway, hemodynamically stable.  No current Critical Care needs.  PCCM will sign off at this time.  Please re-consult should any  critical care needs arise  or if we can be of further assistance.   Darel Hong, AGACNP-BC [facility] Prefer epic messenger for cross cover needs If after hours, please call E-link

## 2022-11-07 NOTE — Progress Notes (Addendum)
Speech Language Pathology Treatment: Dysphagia  Patient Details Name: Shawn Jennings MRN: [id number] DOB: Jan 08, 1946 Today's Date: 11/07/2022 Time: 1100-1140 SLP Time Calculation (min) (ACUTE ONLY): 40 min  Assessment / Plan / Recommendation Clinical Impression  Pt seen for today ongoing toleration of diet s/p Rapid Response called this AM and recent GI endoscopy procedure for GI bleed. Per chart notes/NSG, pt returned to his Baseline - alert and verbal, resting in bed.   He had eaten the majority of his breakfast meal w/ no c/o difficulty swallowing but unsure of pt's Baseline Cognitive status and accuracy of reporting.  He required min verbal cues for orientation/awareness to events and for follow through w/ tasks. Engaged appropriately and could answer basic questions re: self and follow 1-step commands. Upper Denture plate in place; no bottom Dentition.  On [place] O2 2L; afebrile, WBC WNL.  OF NOTE: post GI procedure yesterday, pt was placed on a GI SOFT diet w/ thin liquids by MD; pt has previously been on Nectar consistency liquids post BSE d/t concern for aspiration. Pt exhibits increased engagement at this tx session vs at BSE.    Pt appears to present w/ ongoing pharyngeal phase dysphagia in setting of subtle, overt s/s of aspiration w/ trials of thin liquids c/b immediate/delayed throat clearing/coughing (despite following aspiration precautions). NO decline in respiratory status/effort noted; no discomfort reported by pt or observed post several trials(8+). When asked about the coughing post drinking liquids, pt answered that he was not aware he had any problems swallowing or coughing when drinking(again, unsure of Baseline Cognition). PER AN OUTPATIENT PALLIATIVE CARE NOTE, PT HAS A H/O "ASPIRATION PNEUMONIA" LISTED IN THE NOTE.    In setting of pt's illness/hospitalization, baseline/CHRONIC medical issues including GI bleed and Pulmonary decline and aspiration pneumonia, and current  weakness/deconditioning along w/ overt s/s of dysphagia moreso w/ thin liquids, pt appears at increased risk for aspiration/aspiration pneumonia and is recommended to have a MBSS to objectively assess swallow function and safety w/ thin liquids.    During po trials, oral phase appeared grossly [facility] w/ timely bolus management, mastication/mashing, and control of bolus propulsion for A-P transfer for swallowing w/ both liquids and softened solids. Oral clearing achieved w/ all trial consistencies given min time when needed. Belching+ intermittently.   Recommend continue a current diet w/ modification to Dysphagia level 3 w/ moistened foods d/t lacking bottom Dentition. Recommend aspiration precautions of SINGLE, SMALL sips via CUP w/ thin liquids(until MBSS), Pills WHOLE vs Crushed in Puree for safer, easier swallowing. Tray setup and support w/ meals; positioning support d/t weakness. Less distractions and talking at meals. Rest Breaks during meals for conservation of energy.  Incentive spirometer given for Pulmonary ex/support given his more sedentary status. PT f/u w/ education on it during their session.  Education provided to pt on above including the diet; food consistencies and easy to eat options; general aspiration precautions; need for Rest Breaks during meals to calm Breathing; GERD precautions; pills in puree; oral care; and possible need for more of a dysphagia diet per MBSS recommendations -- uncertain of pt's follow through w/ strategies and recommendations however. MBSS scheduled for tomorrow, 11/08/22. Pt agreed. MD/NSG updated.       HPI HPI: Pt is a 77 y.o. male  w/ past medical history of CAD, heart failure, CKD, CABG, COPD, oxygen supplementation at night 2L, etoh, depression, hypertension, hyperlipidemia, peripheral artery disease, paroxysmal atrial fibrillation on Eliquis, diabetes, left leg amputation presents with epigastric pain for the past 4  days.  Denies nausea or vomiting,  denies GI bleeding.  Some exertional shortness of breath and generalized weakness.     Of note he has had upper GI bleeding back in 2022 which showed gastropathy.   CXR: No active cardiopulmonary disease. Streaky atelectasis or scarring  at the bases.  He resides at a [employer].   OF NOTE: receives Palliative Care services baseline.      SLP Plan  Continue with current plan of care;MBS;New goals to be determined pending instrumental study      Recommendations for follow up therapy are one component of a multi-disciplinary discharge planning process, led by the attending physician.  Recommendations may be updated based on patient status, additional functional criteria and insurance authorization.    Recommendations  Diet recommendations: Dysphagia 3 (mechanical soft);Thin liquid Liquids provided via: Cup;No straw Medication Administration: Whole meds with puree Supervision: Patient able to self feed;Intermittent supervision to cue for compensatory strategies Compensations: Minimize environmental distractions;Slow rate;Small sips/bites;Lingual sweep for clearance of pocketing;Follow solids with liquid Postural Changes and/or Swallow Maneuvers: Out of bed for meals;Seated upright 90 degrees;Upright 30-60 min after meal                General recommendations:  (Palliative Care consult for [place] discussion) Oral Care Recommendations: Oral care BID;Oral care before and after PO;Staff/trained caregiver to provide oral care (denture care) Follow Up Recommendations: Skilled nursing-short term rehab (<3 hours/day) Assistance recommended at discharge: Intermittent Supervision/Assistance SLP Visit Diagnosis: Dysphagia, pharyngeal phase (R13.13) Plan: Continue with current plan of care;MBS;New goals to be determined pending instrumental study             Shawn Jennings, [name], [name]; [name] [phone]  (ascom) [name]  11/07/2022, 5:25 PM

## 2022-11-07 NOTE — Progress Notes (Addendum)
PROGRESS NOTE    Shawn Jennings  ZOX:[id number] DOB: 26-Apr-1946 DOA: 10/31/2022 PCP: Pcp, No   Brief Narrative:  This 77 yrs old male with PMH significant of Parox Afib on anticoagulation w/ Eliquis, DM2, CKD3a, CAD, Ischemic cardiomyopathy, HFrEF, GERD, Hx. 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  Patient presented in the ED with mild epigastric abdominal pain associated with anemia, hemoglobin 6.3 in ED . Patient has received 3 units of PRBC so far.  CTA negative for any active GI bleeding.  Patient is admitted for further evaluation.  GI is consulted.  Patient needed to be off of Eliquis for 2 days.  Patient underwent EGD on 11/06/2021 showed nonbleeding gastric ulcer, did not have preparation for colonoscopy.  SNF is pending,  TOC consulted to reach out to DSS regarding guardianship given patient's limited capacity.   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  He has received 3 units of PRBC so far.  Hemoglobin 7.7 today. Hold  anticoagulation in the setting of GI bleed. Follow HH, has been better will check q12h /as needed  GI consulted.  Patient underwent EGD 11/06/2022. EGD shows nonbleeding gastric ulcer, patient refused preparation for colonoscopy. Follow-up GI for further evaluation.  Chronic heart failure with reduced EF: Does not appear in exacerbation.  Blood pressure remains soft. Holding home medications, Entresto, Lasix, spironolactone, Imdur, beta-blocker. Resume when blood pressure improves.  Paroxysmal atrial fibrillation: Heart rate is now well-controlled. Eliquis is on hold in the setting of GI bleed.   Resume Eliquis once cleared by GI  Essential hypertension: Blood pressure is soft, hold blood pressure medications.  Hyperlipidemia Continue Lipitor 80 mg daily  CAD with stable angina pectoralis: Ischemic cardiomyopathy: Continue Lipitor, Hold Imdur, Entresto due to low BP.  CKD stage IIIa: Metabolic acidosis : Continue sodium bicarbonate infusion. Monitor serum creatinine, avoid nephrotoxic medications  Cognitive impairment: Memory deficit: Patient does have some memory deficits, cognitive impairment. Have consulted TOC regarding getting him set up with DSS guardian     Depression: Continue Cymbalta and lamotrigine.  Left AKA: Stable.  PT and OT when able.  COPD.: Stable, continue home medications.  Not in exacerbation.  Syncope: Patient was found unresponsive in the morning, no response despite given sternal rub,  painful stimuli. He was hemodynamically stable.  History of CT head negative, ammonia level 17, ABG pCO2 34. Patient woke up after having CT head.  EKG normal sinus rhythm.    DVT prophylaxis: None at this time. Code Status: Full code Family Communication: No family at bedside Disposition Plan:  Status is: Inpatient Remains inpatient appropriate because: GI evaluation for bleeding.  EGD shows nonbleeding gastric ulcers, colonoscopy is pending.    Consultants:  Gastroenterology  Procedures: 11/06/2022 showed nonbleeding gastric ulcer  Antimicrobials: Anti-infectives (From admission, onward)    Start     Dose/Rate Route Frequency Ordered Stop   11/01/22 2300  azithromycin (ZITHROMAX) 500 mg in sodium chloride 0.9 % 250 mL IVPB  Status:  Discontinued        500 mg 250 mL/hr over 60 Minutes Intravenous Every 24 hours 11/01/22 0107 11/01/22 0748   11/01/22 2200  cefTRIAXone (ROCEPHIN) 1 g in sodium chloride 0.9 % 100 mL IVPB  Status:  Discontinued        1 g 200 mL/hr over 30 Minutes Intravenous Every 24 hours 11/01/22 0107 11/01/22 0748   11/01/22 0100  cefTRIAXone (ROCEPHIN) 1 g in sodium chloride 0.9 % 100 mL IVPB  Status:  Discontinued        1 g 200 mL/hr over 30 Minutes Intravenous Every 24 hours 11/01/22 0056 11/01/22 0107   11/01/22 0100  azithromycin (ZITHROMAX) 500 mg in sodium chloride 0.9 % 250 mL IVPB  Status:  Discontinued        500 mg 250 mL/hr over 60 Minutes Intravenous Every 24 hours 11/01/22 0056 11/01/22 0107   10/31/22 2115  cefTRIAXone (ROCEPHIN) 1 g in sodium chloride 0.9 % 100 mL IVPB        1 g 200 mL/hr over 30 Minutes Intravenous  Once 10/31/22 2111 10/31/22 2333   10/31/22 2115  azithromycin (ZITHROMAX) 500 mg in sodium chloride 0.9 % 250 mL IVPB        500 mg 250 mL/hr over 60 Minutes Intravenous  Once 10/31/22 2111 11/01/22 0055      Subjective: Patient was seen and examined at bedside.  Overnight events noted. Patient found unresponsive in the morning, RRT was called.  Patient not responsive despite being a sternal rub. CT head, ammonia level,  all workup negative.  Patient woke up after having CT head.  Objective: Vitals:   11/07/22 0755 11/07/22 0758 11/07/22 0933 11/07/22 1147  BP: 110/73 116/62 108/64 (!) 101/58  Pulse: 83 73 71 74  Resp: '20 16 16 17  '$ Temp: 98.2 F (36.8 C)  98.7 F (37.1 C) 98 F (36.7 C)  TempSrc:   Oral   SpO2: 92% 98% 100% 100%  Weight:      Height:         Intake/Output Summary (Last 24 hours) at 11/07/2022 1159 Last data filed at 11/06/2022 2357 Gross per 24 hour  Intake 100 ml  Output 800 ml  Net -700 ml   Filed Weights   10/31/22 1720  Weight: 73 kg    Examination:  General exam: Appears comfortable.Not in any acute distress.  Deconditioned. Respiratory system: CTA  bilaterally, no wheezing, no crackles, normal respiratory effort Cardiovascular system: S1 & S2 heard, regular rate and rhythm, no murmur. Gastrointestinal system: Abdomen is soft, non tender, non distended, BS+ Central nervous system: Alert and oriented x 3 . No focal neurological deficits. Extremities: Left AKA, no edema, no cyanosis, no clubbing Skin: No rashes, lesions or ulcers Psychiatry: Judgement and insight appear normal. Mood & affect appropriate.     Data Reviewed: I have personally reviewed following labs and imaging studies  CBC: Recent Labs  Lab 11/03/22 0625 11/03/22 1656 11/04/22 1622 11/05/22 0506 11/06/22 0402 11/07/22 0528 11/07/22 0820  WBC 12.2*  --   --  11.8*  10.5 9.8 8.9  NEUTROABS  --   --   --   --   --   --  6.1  HGB 8.4*   < > 8.8* 8.7* 8.2* 7.4* 7.7*  HCT 27.4*   < > 29.5* 29.2* 27.4* 24.8* 26.1*  MCV 84.8  --   --  85.9 86.4 86.1 85.9  PLT 295  --   --  309 315 301 298   < > = values in this interval not displayed.   Basic Metabolic Panel: Recent Labs  Lab 11/01/22 0450 11/01/22 0750 11/03/22 0625 11/05/22 0506 11/06/22 0402 11/07/22 0528 11/07/22 0820  NA  --    < > 142 140 139 138 138  K  --    < > 3.9 4.2 4.4 4.2 4.1  CL  --    < > 116* 112* 111 111 109  CO2  --    < > 19* 22 23 20* 21*  GLUCOSE  --    < > 132* 127* 119* 126* 129*  BUN  --    < > 27* 23 24* 24* 24*  CREATININE  --    < > 1.41* 1.22 1.24 1.19 1.30*  CALCIUM  --    < > 8.1* 7.9* 8.2* 8.0* 8.0*  MG  --   --   --   --   --  2.2  --   PHOS 3.4  --   --   --   --   --   --    < > = values in this interval not displayed.   GFR: Estimated  Creatinine Clearance: 48.4 mL/min (A) (by C-G formula based on SCr of 1.3 mg/dL (H)). Liver Function Tests: Recent Labs  Lab 10/31/22 1934 11/06/22 0402  AST 15 11*  ALT 9 10  ALKPHOS 80 61  BILITOT 0.7 0.7  PROT 6.5 5.4*  ALBUMIN 3.1* 2.3*   Recent Labs  Lab 10/31/22 2344  LIPASE 90*   Recent Labs  Lab 11/07/22 0820  AMMONIA 17   Coagulation Profile: Recent Labs  Lab 11/01/22 0450  INR 1.5*   Cardiac Enzymes: No results for input(s): "CKTOTAL", "CKMB", "CKMBINDEX", "TROPONINI" in the last 168 hours. BNP (last 3 results) No results for input(s): "PROBNP" in the last 8760 hours. HbA1C: No results for input(s): "HGBA1C" in the last 72 hours. CBG: Recent Labs  Lab 11/07/22 0758 11/07/22 0846  GLUCAP 129* 230*   Lipid Profile: No results for input(s): "CHOL", "HDL", "LDLCALC", "TRIG", "CHOLHDL", "LDLDIRECT" in the last 72 hours. Thyroid Function Tests: No results for input(s): "TSH", "T4TOTAL", "FREET4", "T3FREE", "THYROIDAB" in the last 72 hours. Anemia Panel: No results for input(s): "VITAMINB12", "FOLATE", "FERRITIN", "TIBC", "IRON", "RETICCTPCT" in the last 72 hours. Sepsis Labs: Recent Labs  Lab 10/31/22 1938 10/31/22 2345 11/07/22 0820  LATICACIDVEN 1.8 0.8 1.3    No results found for this or any previous visit (from the past 240 hour(s)).  Radiology Studies: CT HEAD WO CONTRAST (5MM)  Result Date: 11/07/2022 CLINICAL DATA:  Altered level of consciousness. EXAM: CT HEAD WITHOUT CONTRAST TECHNIQUE: Contiguous axial images were obtained from the base of the skull through the vertex without intravenous contrast. RADIATION DOSE REDUCTION: This exam was performed according to the departmental dose-optimization program which includes automated exposure control, adjustment of the mA and/or kV according to patient size and/or use of iterative reconstruction technique. COMPARISON:  None Available. FINDINGS: Brain: There is no evidence for acute hemorrhage,  hydrocephalus, mass lesion, or abnormal  extra-axial fluid collection. No definite CT evidence for acute infarction. Diffuse loss of parenchymal volume is consistent with atrophy. Patchy low attenuation in the deep hemispheric and periventricular white matter is nonspecific, but likely reflects chronic microvascular ischemic demyelination. 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 Vascular: No hyperdense vessel or unexpected calcification. Skull: No evidence for fracture. No worrisome lytic or sclerotic lesion. Sinuses/Orbits: The visualized paranasal sinuses and mastoid air cells are clear. Visualized portions of the globes and intraorbital fat are unremarkable. Other: None. IMPRESSION: 1. No acute intracranial abnormality. 2. 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 3. Atrophy with chronic small vessel ischemic disease. Electronically Signed   By: Misty Stanley M.D.   On: 11/07/2022 08:42    Scheduled Meds:  atorvastatin  80 mg Oral QHS   cholecalciferol  1,000 Units Oral Daily   cyanocobalamin  1,000 mcg Oral Daily   DULoxetine  30 mg Oral BID   ferrous sulfate  325 mg Oral QODAY   lamoTRIgine  25 mg Oral BID   loratadine  10 mg Oral Daily   melatonin  5 mg Oral QHS   montelukast  10 mg Oral QHS   nicotine  7 mg Transdermal Daily   pantoprazole  40 mg Oral Daily   sodium bicarbonate  650 mg Oral TID   sodium chloride flush  3 mL Intravenous Q12H   Continuous Infusions:   LOS: 6 days    Time spent: 50 mins    Gerrard Crystal, MD [facility]   If 7PM-7AM, please contact night-coverage

## 2022-11-07 NOTE — Progress Notes (Signed)
  Chaplain On-Call responded to Rapid Response notification at 0756 hours.  The medical team was providing treatment and care for the patient. Staff reported that the patient's vital signs have stabilized; however, the patient remains unresponsive to verbal and physical stimuli.  At [place], the patient was transported to the CT Scan area.  Staff and Physician attempted to reach the patient's contact person, but were unsuccessful.  Chaplain Pollyann Samples M.Div., [facility]

## 2022-11-07 NOTE — Progress Notes (Signed)
40: Entered room to change tele battery. Patient noted to be unresponsive, even to sternal rub. Bedside nurse present and NT taking vital signs. Heart rate noted to be 37. Bedside nurse informed to call rapid.  Rapid called at 0756.  0758: Vitals signs stabilized, patient still unresponsive to sternal rub. Respirations equal and unlabored. Pulse palpable and apical pulse noted to be 70. BS 129.  0759: Attending MD at bedside. New orders placed.

## 2022-11-07 NOTE — Progress Notes (Signed)
Physical Therapy Treatment Patient Details Name: Law Corsino MRN: [id number] DOB: 1946-04-08 Today's Date: 11/07/2022   History of Present Illness Patient is a 77 year old male presenting with epigastric pain, anemia. History of left AKA, CAD, heart failure, COPD, CKD, HTN, PAD, A-fib, diabetes, GI bleed.    PT Comments    Patient is agreeable to PT. Vitals stable throughout session. He continues to require minimal assistance for bed mobility with fair sitting balance. He declined transfers this session but feels like he would easily be able to get to his wheelchair if needed. PT will continue to follow while in the hospital to maximize independence and decrease caregiver burden.    Recommendations for follow up therapy are one component of a multi-disciplinary discharge planning process, led by the attending physician.  Recommendations may be updated based on patient status, additional functional criteria and insurance authorization.  Follow Up Recommendations  Long-term institutional care without follow-up therapy Can patient physically be transported by private vehicle: Yes   Assistance Recommended at Discharge Intermittent Supervision/Assistance  Patient can return home with the following A little help with walking and/or transfers;A little help with bathing/dressing/bathroom;Assist for transportation;Help with stairs or ramp for entrance   Equipment Recommendations  None recommended by PT    Recommendations for Other Services       Precautions / Restrictions Precautions Precautions: Fall Restrictions Weight Bearing Restrictions: No     Mobility  Bed Mobility Overal bed mobility: Needs Assistance Bed Mobility: Supine to Sit, Sit to Supine     Supine to sit: Min assist Sit to supine: Min assist   General bed mobility comments: assistance for trunk support to sit upright. verbal cues for technique    Transfers                   General transfer comment:  patient declined due to fatigue. he reports he feels he would be able to easily get to his wheelchair which is his primary means of mobility    Ambulation/Gait                   Stairs             Wheelchair Mobility    Modified Rankin (Stroke Patients Only)       Balance Overall balance assessment: Needs assistance Sitting-balance support: Feet supported Sitting balance-Leahy Scale: Good                                      Cognition Arousal/Alertness: Awake/alert Behavior During Therapy: WFL for tasks assessed/performed Overall Cognitive Status: Within Functional Limits for tasks assessed                                          Exercises      General Comments        Pertinent Vitals/Pain Pain Assessment Pain Assessment: No/denies pain    Home Living                          Prior Function            PT Goals (current goals can now be found in the care plan section) Acute Rehab PT Goals PT Goal Formulation: With patient Time For Goal Achievement: 11/19/22 Potential to  Achieve Goals: Fair Progress towards PT goals: Progressing toward goals    Frequency    Min 2X/week      PT Plan Current plan remains appropriate    Co-evaluation              AM-PAC PT "6 Clicks" Mobility   Outcome Measure  Help needed turning from your back to your side while in a flat bed without using bedrails?: None Help needed moving from lying on your back to sitting on the side of a flat bed without using bedrails?: A Little Help needed moving to and from a bed to a chair (including a wheelchair)?: A Little Help needed standing up from a chair using your arms (e.g., wheelchair or bedside chair)?: A Lot Help needed to walk in hospital room?: A Lot Help needed climbing 3-5 steps with a railing? : Total 6 Click Score: 15    End of Session   Activity Tolerance: Patient tolerated treatment well Patient left:  in bed;with call bell/phone within reach;with bed alarm set Nurse Communication: Mobility status PT Visit Diagnosis: Muscle weakness (generalized) (M62.81);Difficulty in walking, not elsewhere classified (R26.2)     Time: 8101-7510 PT Time Calculation (min) (ACUTE ONLY): 10 min  Charges:  $Therapeutic Activity: 8-22 mins                     Minna Merritts, PT, MPT    [name] 11/07/2022, 1:34 PM

## 2022-11-07 NOTE — Progress Notes (Signed)
Patient transferred back to this unit from ICU following a unresponsive episode resulting in transfer to step down unit.  Patient was transferred back to this unit following increased alertness.  Patient alert, oriented to self, location and situation, denies pain.

## 2022-11-08 ENCOUNTER — Inpatient Hospital Stay: Payer: Medicare Other

## 2022-11-08 DIAGNOSIS — D649 Anemia, unspecified: Secondary | ICD-10-CM | POA: Diagnosis not present

## 2022-11-08 LAB — CBC
HCT: 23.6 % — ABNORMAL LOW (ref 39.0–52.0)
Hemoglobin: 6.9 g/dL — ABNORMAL LOW (ref 13.0–17.0)
MCH: 25.3 pg — ABNORMAL LOW (ref 26.0–34.0)
MCHC: 29.2 g/dL — ABNORMAL LOW (ref 30.0–36.0)
MCV: 86.4 fL (ref 80.0–100.0)
Platelets: 332 10*3/uL (ref 150–400)
RBC: 2.73 MIL/uL — ABNORMAL LOW (ref 4.22–5.81)
RDW: 16.5 % — ABNORMAL HIGH (ref 11.5–15.5)
WBC: 11 10*3/uL — ABNORMAL HIGH (ref 4.0–10.5)
nRBC: 0 % (ref 0.0–0.2)

## 2022-11-08 LAB — BASIC METABOLIC PANEL
Anion gap: 9 (ref 5–15)
BUN: 29 mg/dL — ABNORMAL HIGH (ref 8–23)
CO2: 20 mmol/L — ABNORMAL LOW (ref 22–32)
Calcium: 7.9 mg/dL — ABNORMAL LOW (ref 8.9–10.3)
Chloride: 109 mmol/L (ref 98–111)
Creatinine, Ser: 1.3 mg/dL — ABNORMAL HIGH (ref 0.61–1.24)
GFR, Estimated: 56 mL/min — ABNORMAL LOW (ref >60)
Glucose, Bld: 157 mg/dL — ABNORMAL HIGH (ref 70–99)
Potassium: 4.3 mmol/L (ref 3.5–5.1)
Sodium: 138 mmol/L (ref 135–145)

## 2022-11-08 LAB — THYROID PANEL WITH TSH
Free Thyroxine Index: 2.1 (ref 1.2–4.9)
T3 Uptake Ratio: 31 % (ref 24–39)
T4, Total: 6.9 ug/dL (ref 4.5–12.0)
TSH: 1.53 u[IU]/mL (ref 0.450–4.500)

## 2022-11-08 LAB — PHOSPHORUS: Phosphorus: 4.3 mg/dL (ref 2.5–4.6)

## 2022-11-08 LAB — MAGNESIUM: Magnesium: 2.3 mg/dL (ref 1.7–2.4)

## 2022-11-08 LAB — PREPARE RBC (CROSSMATCH)

## 2022-11-08 MED ORDER — PEG 3350-KCL-NA BICARB-NACL 420 G PO SOLR
4000.0000 mL | Freq: Once | ORAL | Status: AC
  Administered 2022-11-08: 4000 mL via ORAL
  Filled 2022-11-08: qty 4000

## 2022-11-08 MED ORDER — SODIUM CHLORIDE 0.9% IV SOLUTION: Freq: Once | INTRAVENOUS | Status: AC

## 2022-11-08 NOTE — Progress Notes (Signed)
PROGRESS NOTE    Shawn Jennings  HER:[id number] DOB: 12-Jan-1946 DOA: 10/31/2022 PCP: Pcp, No   Brief Narrative:  This 77 yrs old male with PMH significant of Parox Afib on anticoagulation w/ Eliquis, DM2, CKD3a, CAD, Ischemic cardiomyopathy, HFrEF, GERD, Hx. 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  Patient presented in the ED with mild epigastric abdominal pain associated with anemia, hemoglobin 6.3 in ED . Patient has received 3 units of PRBC so far.  CTA negative for any active GI bleeding.  Patient is admitted for further evaluation.  GI is consulted.  Patient needed to be off of Eliquis for 2 days.  Patient underwent EGD on 11/06/2021 showed nonbleeding gastric ulcer, did not have preparation for colonoscopy.  SNF is pending,  TOC consulted to reach out to DSS regarding guardianship given patient's limited capacity.  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  He has received 3 units of PRBC so far.  Hemoglobin 6.9 today. Transfuse 1  PRBC Hold anticoagulation in the setting of GI bleed. Monitor H/ H q12hr, Transfuse PRBC if Hb below 7.0 GI consulted.  Patient underwent EGD 11/06/2022. EGD shows nonbleeding gastric ulcer, patient refused preparation for colonoscopy. Patient agreed for colonoscopy, GI. Notified.  Patient is scheduled for colonoscopy tomorrow.  Chronic heart failure with reduced EF: Does not appear in exacerbation. Blood pressure remains soft. Holding home medications, Entresto, Lasix, spironolactone, Imdur, beta-blocker. Resume when blood pressure improves.  Paroxysmal atrial fibrillation: Heart rate is now well-controlled. Eliquis is on hold in the setting of GI bleed.   Resume Eliquis once cleared by GI  Essential hypertension: Blood pressure is soft, hold blood pressure medications.  Hyperlipidemia Continue Lipitor 80 mg daily  CAD with stable angina pectoralis: Ischemic cardiomyopathy: Continue Lipitor, Hold Imdur, Entresto due to low BP.  CKD stage IIIa: Metabolic acidosis : Continue sodium bicarbonate infusion. Monitor serum creatinine, avoid nephrotoxic medications  Cognitive impairment: Memory deficit: Patient does have some memory deficits, cognitive impairment. Have consulted TOC regarding getting him set up with DSS guardian.    Depression: Continue Cymbalta and lamotrigine.  Left AKA: Stable.  PT and OT when able.  COPD.: Stable, continue home medications.  Not in exacerbation.  Syncope: Patient was found unresponsive in the morning on 1/17, no response despite given sternal rub,  painful stimuli. He was hemodynamically stable.  CT head negative, ammonia level 17, ABG pCO2 34. Patient woke up after having CT head.  EKG normal sinus rhythm.    DVT prophylaxis: None at this time. Code Status: Full code Family  Communication: No family at bedside Disposition Plan:   Status is: Inpatient Remains inpatient appropriate because: GI evaluation for bleeding.  EGD shows  nonbleeding gastric ulcers, colonoscopy is pending.   Consultants:  Gastroenterology  Procedures: 11/06/2022 showed nonbleeding gastric ulcer  Antimicrobials: Anti-infectives (From admission, onward)    Start     Dose/Rate Route Frequency Ordered Stop   11/01/22 2300  azithromycin (ZITHROMAX) 500 mg in sodium chloride 0.9 % 250 mL IVPB  Status:  Discontinued        500 mg 250 mL/hr over 60 Minutes Intravenous Every 24 hours 11/01/22 0107 11/01/22 0748   11/01/22 2200  cefTRIAXone (ROCEPHIN) 1 g in sodium chloride 0.9 % 100 mL IVPB  Status:  Discontinued        1 g 200 mL/hr over 30 Minutes Intravenous Every 24 hours 11/01/22 0107 11/01/22 0748   11/01/22 0100  cefTRIAXone (ROCEPHIN) 1 g in sodium chloride 0.9 % 100 mL IVPB  Status:  Discontinued        1 g 200 mL/hr over 30 Minutes Intravenous Every 24 hours 11/01/22 0056 11/01/22 0107   11/01/22 0100  azithromycin (ZITHROMAX) 500 mg in sodium chloride 0.9 % 250 mL IVPB  Status:  Discontinued        500 mg 250 mL/hr over 60 Minutes Intravenous Every 24 hours 11/01/22 0056 11/01/22 0107   10/31/22 2115  cefTRIAXone (ROCEPHIN) 1 g in sodium chloride 0.9 % 100 mL IVPB        1 g 200 mL/hr over 30 Minutes Intravenous  Once 10/31/22 2111 10/31/22 2333   10/31/22 2115  azithromycin (ZITHROMAX) 500 mg in sodium chloride 0.9 % 250 mL IVPB        500 mg 250 mL/hr over 60 Minutes Intravenous  Once 10/31/22 2111 11/01/22 0055      Subjective: Patient was seen and examined at bedside.Overnight events noted. Patient reports doing better. Denies any chest pain or shortness of breath.Patient has agreed for colonoscopy. GI was notified and he is scheduled for colonoscopy tomorrow.Getting 1 unit of PRBC for hemoglobin 6.9.  Objective: Vitals:   11/08/22 0741 11/08/22 1132 11/08/22 1236 11/08/22 1255  BP: 106/63 106/60 109/60 114/60  Pulse: 80 81 73 71  Resp: '16 18 18 18  '$ Temp: 98 F (36.7 C) 97.9 F (36.6 C) 98.7 F (37.1 C) 97.7 F (36.5  C)  TempSrc:   Oral Oral  SpO2: 92% 97% 98% 99%  Weight:      Height:        Intake/Output Summary (Last 24 hours) at 11/08/2022 1341 Last data filed at 11/08/2022 1236 Gross per 24 hour  Intake 240 ml  Output 500 ml  Net -260 ml   Filed Weights   10/31/22 1720  Weight: 73 kg    Examination:  General exam: Appears comfortable, not in any acute distress.  Deconditioned. Respiratory system: CTA bilaterally, no wheezing, no crackles, respiratory effort normal, RR 12. Cardiovascular system: S1 & S2 heard, regular rate and rhythm, no murmur. Gastrointestinal system: Abdomen is soft, non tender, non distended, BS+ Central nervous system: Alert and oriented x 3 . No focal neurological deficits. Extremities: Left AKA, no edema, no cyanosis, no clubbing. Skin: No rashes, lesions or ulcers Psychiatry: Judgement and insight appear normal. Mood & affect appropriate.     Data Reviewed: I have personally reviewed following labs and imaging studies  CBC: Recent Labs  Lab 11/05/22 0506 11/06/22 0402 11/07/22 0528 11/07/22 0820 11/08/22 0314  WBC 11.8* 10.5  9.8 8.9 11.0*  NEUTROABS  --   --   --  6.1  --   HGB 8.7* 8.2* 7.4* 7.7* 6.9*  HCT 29.2* 27.4* 24.8* 26.1* 23.6*  MCV 85.9 86.4 86.1 85.9 86.4  PLT 309 315 301 298 850   Basic Metabolic Panel: Recent Labs  Lab 11/05/22 0506 11/06/22 0402 11/07/22 0528 11/07/22 0820 11/08/22 0314  NA 140 139 138 138 138  K 4.2 4.4 4.2 4.1 4.3  CL 112* 111 111 109 109  CO2 22 23 20* 21* 20*  GLUCOSE 127* 119* 126* 129* 157*  BUN 23 24* 24* 24* 29*  CREATININE 1.22 1.24 1.19 1.30* 1.30*  CALCIUM 7.9* 8.2* 8.0* 8.0* 7.9*  MG  --   --  2.2  --  2.3  PHOS  --   --   --   --  4.3   GFR: Estimated Creatinine Clearance: 48.4 mL/min (A) (by C-G formula based on SCr of 1.3 mg/dL (H)). Liver Function Tests: Recent Labs  Lab 11/06/22 0402  AST 11*  ALT 10  ALKPHOS 61  BILITOT 0.7  PROT 5.4*  ALBUMIN 2.3*   No results for  input(s): "LIPASE", "AMYLASE" in the last 168 hours.  Recent Labs  Lab 11/07/22 0820  AMMONIA 17   Coagulation Profile: No results for input(s): "INR", "PROTIME" in the last 168 hours.  Cardiac Enzymes: No results for input(s): "CKTOTAL", "CKMB", "CKMBINDEX", "TROPONINI" in the last 168 hours. BNP (last 3 results) No results for input(s): "PROBNP" in the last 8760 hours. HbA1C: No results for input(s): "HGBA1C" in the last 72 hours. CBG: Recent Labs  Lab 11/07/22 0758 11/07/22 0846  GLUCAP 129* 230*   Lipid Profile: No results for input(s): "CHOL", "HDL", "LDLCALC", "TRIG", "CHOLHDL", "LDLDIRECT" in the last 72 hours. Thyroid Function Tests: Recent Labs    11/07/22 1210  TSH 1.530  T4TOTAL 6.9   Anemia Panel: No results for input(s): "VITAMINB12", "FOLATE", "FERRITIN", "TIBC", "IRON", "RETICCTPCT" in the last 72 hours. Sepsis Labs: Recent Labs  Lab 11/07/22 0820  LATICACIDVEN 1.3    No results found for this or any previous visit (from the past 240 hour(s)).  Radiology Studies: DG Swallowing Func-Speech Pathology  Result Date: 11/08/2022 Calla Kicks     11/08/2022  9:54 AM Objective Swallowing Evaluation: Type of Study: MBS-Modified Barium Swallow Study  Patient Details Name: Shawn Jennings MRN: [id number] Date of Birth: 07-Dec-1945 Today's Date: 11/08/2022 Time: SLP Start Time (ACUTE ONLY): 0805 -SLP Stop Time (ACUTE ONLY): 0850 SLP Time Calculation (min) (ACUTE ONLY): 45 min Past Medical History: Past Medical History: Diagnosis Date  Acquired dilation of ascending aorta and aortic root ([place])   a. 06/2022 Echo: Asc Ao 44m.  Aortic stenosis   a. Pt unaware of history but CT imaging 01/2019 consistent w/ AVR; b. 01/2019 Echo: Mild to mod AS. Mean grad 168mg; c. 06/2022 Echo: Nl fxn'ing prosth AoV. Asc Ao 4072m CAD (coronary artery disease)   a. [place] 05[place]2020 Cath: Sev 3VD, patent grafts->Med Rx; c. 06/2022 NSTEMI/PCI: LM diff dzs, LAD  100m52m 40, LCX 100p/m, OM3 mod dzs, RCA 100p, LIMA->LAD ok, RIMA->RPDA 40ost, VG->D2 30p/95p (3.0x22 Onyx Frontier DES)--< L Radial-OM3 20ost.  Chronic HFrEF (heart failure with reduced ejection fraction) (HCC)[place]. 01/2019 Echo: EF 40-45%; b. 04/2020 Echo: EF 25-30%; c. 09/2020 Echo: EF 50-55%; d. 07/2021 Echo: EF 20-25%; e. 06/2022 Echo: EF 20-25%, glob HK, mild LVH, GrII DD, nl RV fxn,  nl fxn'ing bioprosth AoV, Asc Ao 17m.  CKD (chronic kidney disease), stage III (HCC)   COPD (chronic obstructive pulmonary disease) ([place]   Depression   Essential hypertension   GERD (gastroesophageal reflux disease)   Hyperlipidemia LDL goal <70   Ischemic cardiomyopathy   a. 01/2019 Echo: EF 40-45%; b. 04/2020 Echo: EF 25-30%; c. 09/2020 Echo: EF 50-55%; d. 07/2021 Echo: EF 20-25%; e. 06/2022 Echo: EF 20-25%.  PAD (peripheral artery disease) ([place]   a. 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  PAF (paroxysmal atrial fibrillation) (HCC)   a. CHA2DS2VASc = 4-->Xarelto '15mg'$  daily in setting of CKD.  Thyrotoxicosis   Tobacco abuse   Type II diabetes mellitus ([place]  Past Surgical History: Past Surgical History: Procedure Laterality Date  CARDIAC CATHETERIZATION    CHOLECYSTECTOMY    COLONOSCOPY    COLONOSCOPY WITH PROPOFOL N/A 07/20/2021  Procedure: COLONOSCOPY WITH PROPOFOL;  Surgeon: VLin Landsman MD;  [facility];  Service: Gastroenterology;  Laterality: N/A;  COLONOSCOPY WITH PROPOFOL N/A 11/06/2022  Procedure: COLONOSCOPY WITH PROPOFOL;  Surgeon: WLucilla Lame MD;  [facility];  Service: Endoscopy;  Laterality: N/A;  CORONARY ARTERY BYPASS GRAFT    a. [place] CORONARY STENT INTERVENTION N/A 07/18/2022  Procedure: CORONARY STENT INTERVENTION;  Surgeon: ENelva Bush MD;  [facility];  Service: Cardiovascular;  Laterality: N/A;  ENDOSCOPIC RETROGRADE CHOLANGIOPANCREATOGRAPHY (ERCP) WITH PROPOFOL N/A 10/19/2020  Procedure: ENDOSCOPIC RETROGRADE CHOLANGIOPANCREATOGRAPHY (ERCP) WITH PROPOFOL;   Surgeon: WLucilla Lame MD;  Location: [facility] ENDOSCOPY;  Service: Endoscopy;  Laterality: N/A;  ERCP N/A 12/29/2020  Procedure: ENDOSCOPIC RETROGRADE CHOLANGIOPANCREATOGRAPHY (ERCP);  Surgeon: WLucilla Lame MD;  [facility];  Service: Endoscopy;  Laterality: N/A;  ESOPHAGOGASTRODUODENOSCOPY (EGD) WITH PROPOFOL N/A 07/18/2021  Procedure: ESOPHAGOGASTRODUODENOSCOPY (EGD) WITH PROPOFOL;  Surgeon: VLin Landsman MD;  [facility];  Service: Gastroenterology;  Laterality: N/A;  ESOPHAGOGASTRODUODENOSCOPY (EGD) WITH PROPOFOL N/A 11/06/2022  Procedure: ESOPHAGOGASTRODUODENOSCOPY (EGD) WITH PROPOFOL;  Surgeon: WLucilla Lame MD;  Location: [facility] ENDOSCOPY;  Service: Endoscopy;  Laterality: N/A;  Left AKA    RIGHT HEART CATH AND CORONARY/GRAFT ANGIOGRAPHY N/A 07/18/2022  Procedure: RIGHT HEART CATH AND CORONARY/GRAFT ANGIOGRAPHY;  Surgeon: ENelva Bush MD;  [facility];  Service: Cardiovascular;  Laterality: N/A;  RIGHT/LEFT HEART CATH AND CORONARY ANGIOGRAPHY N/A 06/06/2020  Procedure: RIGHT/LEFT HEART CATH AND CORONARY ANGIOGRAPHY;  Surgeon: AWellington Hampshire MD;  [facility];  Service: Cardiovascular;  Laterality: N/A; HPI: Pt is a 77y.o. male  w/ past medical history of CAD, heart failure, CKD, CABG, COPD, oxygen supplementation at night 2L, etoh, depression, hypertension, hyperlipidemia, peripheral artery disease, paroxysmal atrial fibrillation on Eliquis, diabetes, left leg amputation presents with epigastric pain for the past 4 days.  Denies nausea or vomiting, denies GI bleeding.  Some exertional shortness of breath and generalized weakness.     Of note he has had upper GI bleeding back in 2022 which showed gastropathy.   CXR: No active cardiopulmonary disease. Streaky atelectasis or scarring  at the bases.  [facility]   OF NOTE: receives Palliative Care services baseline.  Subjective: Pt alert, pleasant, and cooperative.  Denies hx of dysphagia, but endorses chronic cough which pt attributes to smoking.  Recommendations for follow up therapy are one component of a multi-disciplinary discharge planning process, led by the attending physician.  Recommendations may be updated based on patient status, additional functional criteria and insurance authorization. Assessment / Plan / Recommendation  11/08/2022   9:43 AM Clinical Impressions Clinical Impression Pt seen for MBSS. Pt alert, pleasant, and cooperative. hx of dysphagia, but endorses chronic cough which pt attributes to smoking. On 2L/min O2 via [place]. Pt given trials of solid, pureed, nectar-thick liquids (via tsp, cup), and thin liquids (via tsp, cup). Lordotic spinal curvature appreciated.  Pt presents with a mild oropharyngeal dysphagia. Oral phase notable for trace oral residual across consistencies and prolonged mastication of solids. Pharyngeal phase notable for trace-mild pharyngeal stasis (vallecular, pyriform sinus, and posterior pharyngeal wall), swallow initiation at the vallecula and/or pyriform sinus, transient during the swallow penetration of nectar-thick liquids via cup, and SILENT before the swallow aspiration of thin liquids via cup. Above mentioned deficits secondary to: xerostomia, reduced base of tongue retraction, reduced hyolaryngeal elevation, reduced/mistimed laryngeal vesitbule closure, reduced pharyngeal stripping wave, and reduced laryngeal sensation. Recommend a mech soft diet with nectar-thick liquids with safe swallowing strategies/aspiration precautions as outlined below. Education: Pt educated at [employer] re: diet recommendations, safe swallowing strategies/aspiration precautions, results of assessment, rationale for thickened liquids, risks of aspiration/aspiration PNA, relationship between breathing/swallowing, and SLP POC. Pt shown videofluoroscopic images on TIMS after study was completed. Pt asked pertinent questions and verbalized satisfaction  with answers provided by SLP. Sign posted in pt's room with diet recommendations and safe swallowing strategies/aspiration precautions. RN made aware of results, recommendations and SLP POC. SLP POC: SLP to f/u per POC for diet tolerance and follow up education. Recommend post-acute SLP services for dysphagia tx.     11/08/2022   9:32 AM Treatment Recommendations Treatment Recommendations Therapy as outlined in treatment plan below     11/08/2022   9:33 AM Prognosis Prognosis for Safe Diet Advancement Fair Barriers to Reach Goals Cognitive deficits;Time post onset;Severity of deficits   11/08/2022   9:32 AM Diet Recommendations SLP Diet Recommendations Dysphagia 3 (Mech soft) solids;Nectar thick liquid Medication Administration -- Compensations Minimize environmental distractions;Slow rate;Small sips/bites Postural Changes Remain semi-upright after after feeds/meals (Comment);Seated upright at 90 degrees     11/08/2022   9:32 AM Other Recommendations Oral Care Recommendations Oral care QID Other Recommendations Order thickener from pharmacy;Prohibited food (jello, ice cream, thin soups);Remove water pitcher;Have oral suction available Follow Up Recommendations Skilled nursing-short term rehab (<3 hours/day) Functional Status Assessment Patient has had a recent decline in their functional status and/or demonstrates limited ability to make significant improvements in function in a reasonable and predictable amount of time   11/08/2022   9:32 AM Frequency and Duration  Speech Therapy Frequency (ACUTE ONLY) min 2x/week     11/08/2022   9:39 AM Oral Phase Oral - Nectar Teaspoon Lingual/palatal residue;Premature spillage Oral - Nectar Cup Premature spillage;Lingual/palatal residue Oral - Nectar Straw NT Oral - Thin Teaspoon Lingual/palatal residue;Premature spillage Oral - Thin Cup Lingual/palatal residue;Premature spillage Oral - Thin Straw NT Oral - Puree WFL Oral - Mech Soft NT Oral - Regular Impaired mastication     11/08/2022   9:40 AM Pharyngeal Phase Pharyngeal- Nectar Teaspoon Delayed swallow initiation-pyriform sinuses;Pharyngeal residue - pyriform;Pharyngeal residue - valleculae;Reduced tongue base retraction;Reduced laryngeal elevation Pharyngeal Material does not enter airway Pharyngeal- Nectar Cup Delayed swallow initiation-pyriform sinuses;Reduced airway/laryngeal closure;Penetration/Aspiration during swallow;Pharyngeal residue - valleculae;Pharyngeal residue - pyriform;Reduced laryngeal elevation;Reduced tongue base retraction Pharyngeal Material enters airway, remains ABOVE vocal cords then ejected out Pharyngeal- Nectar Straw NT Pharyngeal- Puree Delayed swallow initiation-vallecula Pharyngeal Material does not enter airway Pharyngeal- Mechanical Soft NT Pharyngeal- Regular Delayed swallow initiation-vallecula Pharyngeal- Multi-consistency NT    11/08/2022  9:73 AM Cervical Esophageal Phase  Cervical Esophageal Phase [facility] Cherrie Gauze, M.S., [facility] [phone] [place]) Quintella Baton 11/08/2022, 9:49 AM                     CT HEAD WO CONTRAST (5MM)  Result Date: 11/07/2022 CLINICAL DATA:  Altered level of consciousness. EXAM: CT HEAD WITHOUT CONTRAST TECHNIQUE: Contiguous axial images were obtained from the base of the skull through the vertex without intravenous contrast. RADIATION DOSE REDUCTION: This exam was performed according to the departmental dose-optimization program which includes automated exposure control, adjustment of the mA and/or kV according to patient size and/or use of iterative reconstruction technique. COMPARISON:  None Available. FINDINGS: Brain: There is no evidence for acute hemorrhage, hydrocephalus, mass lesion, or abnormal extra-axial fluid collection. No definite CT evidence for acute infarction. Diffuse loss of parenchymal volume is consistent with atrophy. Patchy low attenuation in the deep  hemispheric and periventricular white matter is nonspecific, but likely reflects chronic microvascular ischemic demyelination. 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 Vascular: No hyperdense vessel or unexpected calcification. Skull: No evidence for fracture. No worrisome lytic or sclerotic lesion. Sinuses/Orbits: The visualized paranasal sinuses and mastoid air cells are clear. Visualized portions of the globes and intraorbital fat are unremarkable. Other: None. IMPRESSION: 1. No acute intracranial abnormality. 2. 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 3. Atrophy with chronic small vessel ischemic disease. Electronically Signed   By: Misty Stanley M.D.   On: 11/07/2022 08:42    Scheduled Meds:  atorvastatin  80 mg Oral QHS   cholecalciferol  1,000 Units Oral Daily   cyanocobalamin  1,000 mcg Oral Daily   DULoxetine  30 mg Oral BID   ferrous sulfate  325 mg Oral QODAY   lamoTRIgine  25 mg Oral BID   loratadine  10 mg Oral Daily   melatonin  5 mg Oral QHS   montelukast  10 mg Oral QHS   nicotine  7 mg Transdermal Daily   pantoprazole  40 mg Oral Daily   polyethylene glycol-electrolytes  4,000 mL Oral Once   sodium bicarbonate  650 mg Oral TID   sodium chloride flush  3 mL Intravenous Q12H   Continuous Infusions:   LOS: 7 days    Time spent: 35 mins    Torez Beauregard, MD [facility]   If 7PM-7AM, please contact night-coverage

## 2022-11-08 NOTE — TOC Initial Note (Addendum)
Transition of Care [facility]) - Initial/Assessment Note    Patient Details  Name: Shawn Jennings MRN: [id number] Date of Birth: 20-Dec-1945  Transition of Care [facility]) CM/SW Contact:    Gerilyn Pilgrim, LCSW Phone Number: 11/08/2022, 1:06 PM  Clinical Narrative:  SW spoke with Anderson Malta at [facility] who reports that she would love to receive the pt back if he is still appropriate for ALF care. Pt to have colonoscopy 1/19 and per MD be ready to discharge on Saturday. Will follow up with [facility] to see if they will be able to take him and what [facility] agency that they prefer. Anderson Malta reports pt has no family involved in his care at al.      1:13pm Spoke with Engineer, civil (consulting) at [employer]. Pt cannot return over the weekend. Anderson Malta states pt would have to return on either Friday or Monday. Anderson Malta also states preferred provider for facility is  [facility]. CSW to arrange [facility] services.               Expected Discharge Plan: Assisted Living Barriers to Discharge: Continued Medical Work up   Patient Goals and CMS Choice            Expected Discharge Plan and Services                                              Prior Living Arrangements/Services                       Activities of Daily Living Home Assistive Devices/Equipment: Environmental consultant (specify type) ADL Screening (condition at time of admission) Patient's cognitive ability adequate to safely complete daily activities?: Yes Is the patient deaf or have difficulty hearing?: No Does the patient have difficulty seeing, even when wearing glasses/contacts?: No Does the patient have difficulty concentrating, remembering, or making decisions?: Yes Patient able to express need for assistance with ADLs?: Yes Does the patient have difficulty dressing or bathing?: No Independently performs ADLs?: No Communication: Independent Dressing (OT): Needs assistance Is this a change from baseline?: Pre-admission  baseline Grooming: Independent Feeding: Independent Bathing: Needs assistance Is this a change from baseline?: Pre-admission baseline Toileting: Needs assistance Is this a change from baseline?: Pre-admission baseline In/Out Bed: Needs assistance Is this a change from baseline?: Pre-admission baseline Walks in Home: Needs assistance Is this a change from baseline?: Pre-admission baseline Does the patient have difficulty walking or climbing stairs?: Yes Weakness of Legs: Both Weakness of Arms/Hands: Both  Permission Sought/Granted                  Emotional Assessment       Orientation: : Fluctuating Orientation (Suspected and/or reported Sundowners)      Admission diagnosis:  Blood loss anemia [D50.0] Epigastric pain [R10.13] Anemia [D64.9] UGIB (upper gastrointestinal bleed) [K92.2] Aspiration pneumonia, unspecified aspiration pneumonia type, unspecified laterality, unspecified part of lung ([place]) [J69.0] Patient Active Problem List   Diagnosis Date Noted   Gastric ulcer 11/06/2022   Blood loss anemia 73/41/9379   Metabolic acidosis 02/40/9735   Dyspepsia 11/01/2022   Atherosclerosis 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 ([place]) 08/08/2022   Aspiration pneumonia ([place]) 07/18/2022   Dyslipidemia 07/18/2022   Non-STEMI (non-ST elevated myocardial infarction) ([place]) 07/17/2022   HFrEF (heart failure with reduced ejection fraction) ([place]) 06/11/2022   Anemia 11/09/2021   NSTEMI (non-ST  elevated myocardial infarction) ([place]) 11/08/2021   Acute on chronic systolic CHF (congestive heart failure) ([place]) 11/08/2021   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[place]) 08/04/2021   Chronic obstructive pulmonary disease, unspecified ([place]) 08/04/2021   Hyperthyroidism 08/04/2021   CHF exacerbation ([place]) 65/46/5035   Acute diastolic CHF (congestive heart failure) ([place]) 08/03/2021   COVID-19 virus infection 08/03/2021   Hypotension    Acute kidney injury  superimposed on CKD ([place])    Acute blood loss anemia    Hypokalemia    Type 2 diabetes mellitus with stage 3a chronic kidney disease, without long-term current use of insulin (HCC)    Abdominal pain    GI bleed 07/15/2021   PAF (paroxysmal atrial fibrillation) ([place]) 07/15/2021   On anticoagulant therapy 07/15/2021   Depression 07/15/2021   GERD (gastroesophageal reflux disease) 07/15/2021   Tobacco abuse 07/15/2021   Hyperlipidemia 07/15/2021   Upper GI bleed    Encounter for fitting and adjustment of other gastrointestinal appliance and device    Cholangitis    Protein-calorie malnutrition, severe 10/18/2020   Choledocholithiasis 10/17/2020   Coronary artery disease of native artery of native heart with stable angina pectoris ([place])    Ischemic cardiomyopathy    Ischemic chest pain ([place]) 03/10/2020   Chronic kidney disease, stage 3a ([place]) 03/09/2020   Essential hypertension 03/09/2020   Chest pain 01/27/2019   Stage 3a chronic kidney disease (CKD) ([place])    Nonspecific chest pain 01/26/2019   PCP:  Pcp, No Pharmacy:   [place], [place] - [address] [place] [address] Phone: [phone] Fax: [phone]     Social Determinants of Health (SDOH) Social History: SDOH Screenings   Food Insecurity: No Food Insecurity (11/01/2022)  Housing: Low Risk  (11/01/2022)  Transportation Needs: No Transportation Needs (11/01/2022)  Utilities: Not At Risk (11/01/2022)  Depression (PHQ2-9): Low Risk  (06/11/2022)  Tobacco Use: High Risk (11/07/2022)   SDOH Interventions:     Readmission Risk Interventions    11/08/2022    1:04 PM  Readmission Risk Prevention Plan  Transportation Screening Complete  Medication Review (RN Care Manager) Complete  PCP or Specialist appointment within 3-5 days of discharge Complete  HRI or Home Care Consult Complete  Palliative Care Screening Not [place] Complete

## 2022-11-08 NOTE — Procedures (Signed)
Objective Swallowing Evaluation: Type of Study: MBS-Modified Barium Swallow Study   Patient Details  Name: Shawn Jennings MRN: [id number] Date of Birth: 10/18/1946  Today's Date: 11/08/2022 Time: SLP Start Time (ACUTE ONLY): 0805 -SLP Stop Time (ACUTE ONLY): 0850  SLP Time Calculation (min) (ACUTE ONLY): 45 min   Past Medical History:  Past Medical History:  Diagnosis Date   Acquired dilation of ascending aorta and aortic root ([place])    a. 06/2022 Echo: Asc Ao 66m.   Aortic stenosis    a. Pt unaware of history but CT imaging 01/2019 consistent w/ AVR; b. 01/2019 Echo: Mild to mod AS. Mean grad 150mg; c. 06/2022 Echo: Nl fxn'ing prosth AoV. Asc Ao 4078m  CAD (coronary artery disease)    a. [place]G [place] 05[place] Cath: Sev 3VD, patent grafts->Med Rx; c. 06/2022 NSTEMI/PCI: LM diff dzs, LAD 100m65m 40, LCX 100p/m, OM3 mod dzs, RCA 100p, LIMA->LAD ok, RIMA->RPDA 40ost, VG->D2 30p/95p (3.0x22 Onyx Frontier DES)--< L Radial-OM3 20ost.   Chronic HFrEF (heart failure with reduced ejection fraction) (HCC)[place] a. 01/2019 Echo: EF 40-45%; b. 04/2020 Echo: EF 25-30%; c. 09/2020 Echo: EF 50-55%; d. 07/2021 Echo: EF 20-25%; e. 06/2022 Echo: EF 20-25%, glob HK, mild LVH, GrII DD, nl RV fxn, nl fxn'ing bioprosth AoV, Asc Ao 40mm34mCKD (chronic kidney disease), stage III (HCC)    COPD (chronic obstructive pulmonary disease) (HCC) [place]    Essential hypertension    GERD (gastroesophageal reflux disease)    Hyperlipidemia LDL goal <70    Ischemic cardiomyopathy    a. 01/2019 Echo: EF 40-45%; b. 04/2020 Echo: EF 25-30%; c. 09/2020 Echo: EF 50-55%; d. 07/2021 Echo: EF 20-25%; e. 06/2022 Echo: EF 20-25%.   PAD (peripheral artery disease) (HCC) [place]. 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   PAF (paroxysmal atrial fibrillation) (HCC)    a. CHA2DS2VASc = 4-->Xarelto '15mg'$  daily in setting of CKD.   Thyrotoxicosis    Tobacco abuse    Type II diabetes mellitus (HCC) [place]:  Past Surgical  History:  Procedure Laterality Date   CARDIAC CATHETERIZATION     CHOLECYSTECTOMY     COLONOSCOPY     COLONOSCOPY WITH PROPOFOL N/A 07/20/2021   Procedure: COLONOSCOPY WITH PROPOFOL;  Surgeon: VangaLin Landsman  [facility];  Service: Gastroenterology;  Laterality: N/A;   COLONOSCOPY WITH PROPOFOL N/A 11/06/2022   Procedure: COLONOSCOPY WITH PROPOFOL;  Surgeon: Wohl,[name]  [facility];  Service: Endoscopy;  Laterality: N/A;   CORONARY ARTERY BYPASS GRAFT     a. 1994 [place]VENT[place] N/A 07/18/2022   Procedure: CORONARY STENT INTERVENTION;  Surgeon: End, Nelva Bush  [facility];  Service: Cardiovascular;  Laterality: N/A;   ENDOSCOPIC RETROGRADE CHOLANGIOPANCREATOGRAPHY (ERCP) WITH PROPOFOL N/A 10/19/2020   Procedure: ENDOSCOPIC RETROGRADE CHOLANGIOPANCREATOGRAPHY (ERCP) WITH PROPOFOL;  Surgeon: Wohl,[name]  [facility]: [facility];  Service: Endoscopy;  Laterality: N/A;   ERCP N/A 12/29/2020   Procedure: ENDOSCOPIC RETROGRADE CHOLANGIOPANCREATOGRAPHY (ERCP);  Surgeon: Wohl,[name]  [facility];  Service: Endoscopy;  Laterality: N/A;   ESOPHAGOGASTRODUODENOSCOPY (EGD) WITH PROPOFOL N/A 07/18/2021   Procedure: ESOPHAGOGASTRODUODENOSCOPY (EGD) WITH PROPOFOL;  Surgeon: VangaLin Landsman  [facility];  Service: Gastroenterology;  Laterality: N/A;   ESOPHAGOGASTRODUODENOSCOPY (EGD) WITH PROPOFOL N/A 11/06/2022   Procedure: ESOPHAGOGASTRODUODENOSCOPY (EGD) WITH PROPOFOL;  Surgeon: Wohl,[name]  Location: [facility];  Service: Endoscopy;  Laterality: N/A;   Left AKA     RIGHT HEART CATH AND CORONARY/GRAFT ANGIOGRAPHY N/A 07/18/2022   Procedure: RIGHT HEART CATH AND CORONARY/GRAFT ANGIOGRAPHY;  Surgeon: Nelva Bush, MD;  Location: [facility];  Service: Cardiovascular;  Laterality: N/A;   RIGHT/LEFT HEART CATH AND CORONARY ANGIOGRAPHY N/A 06/06/2020    Procedure: RIGHT/LEFT HEART CATH AND CORONARY ANGIOGRAPHY;  Surgeon: Wellington Hampshire, MD;  Location: [facility];  Service: Cardiovascular;  Laterality: N/A;   HPI: Pt is a 77 y.o. male  w/ past medical history of CAD, heart failure, CKD, CABG, COPD, oxygen supplementation at night 2L, etoh, depression, hypertension, hyperlipidemia, peripheral artery disease, paroxysmal atrial fibrillation on Eliquis, diabetes, left leg amputation presents with epigastric pain for the past 4 days.  Denies nausea or vomiting, denies GI bleeding.  Some exertional shortness of breath and generalized weakness.     Of note he has had upper GI bleeding back in 2022 which showed gastropathy.   CXR: No active cardiopulmonary disease. Streaky atelectasis or scarring  at the bases.  He resides at a [facility].   OF NOTE: receives Palliative Care services baseline.   Subjective: Pt alert, pleasant, and cooperative. Denies hx of dysphagia, but endorses chronic cough which pt attributes to smoking.    Recommendations for follow up therapy are one component of a multi-disciplinary discharge planning process, led by the attending physician.  Recommendations may be updated based on patient status, additional functional criteria and insurance authorization.  Assessment / Plan / Recommendation     11/08/2022    9:43 AM  Clinical Impressions  Clinical Impression Pt seen for MBSS. Pt alert, pleasant, and cooperative. hx of dysphagia, but endorses chronic cough which pt attributes to smoking. On 2L/min O2 via [place]. Pt given trials of solid, pureed, nectar-thick liquids (via tsp, cup), and thin liquids (via tsp, cup). Lordotic spinal curvature appreciated.    Pt presents with a mild oropharyngeal dysphagia. Oral phase notable for trace oral residual across consistencies and prolonged mastication of solids. Pharyngeal phase notable for trace-mild pharyngeal stasis (vallecular, pyriform sinus, and posterior pharyngeal  wall), swallow initiation at the vallecula and/or pyriform sinus, transient during the swallow penetration of nectar-thick liquids via cup, and SILENT before the swallow aspiration of thin liquids via cup. Above mentioned deficits secondary to: xerostomia, reduced base of tongue retraction, reduced hyolaryngeal elevation, reduced/mistimed laryngeal vesitbule closure, reduced pharyngeal stripping wave, and reduced laryngeal sensation.   Recommend a mech soft diet with nectar-thick liquids with safe swallowing strategies/aspiration precautions as outlined below.   Education: Pt educated at [employer] re: diet recommendations, safe swallowing strategies/aspiration precautions, results of assessment, rationale for thickened liquids, risks of aspiration/aspiration PNA, relationship between breathing/swallowing, and SLP POC. Pt shown videofluoroscopic images on TIMS after study was completed. Pt asked pertinent questions and verbalized satisfaction with answers provided by SLP. Sign posted in pt's room with diet recommendations and safe swallowing strategies/aspiration precautions. RN made aware of results, recommendations and SLP POC.   SLP POC: SLP to f/u per POC for diet tolerance and follow up education. Recommend post-acute SLP services for dysphagia tx.         11/08/2022    9:32 AM  Treatment Recommendations  Treatment Recommendations Therapy as outlined in treatment plan below        11/08/2022    9:33 AM  Prognosis  Prognosis for Safe Diet Advancement Fair  Barriers to Reach Goals Cognitive deficits;Time post  onset;Severity of deficits       11/08/2022    9:32 AM  Diet Recommendations  SLP Diet Recommendations Dysphagia 3 (Mech soft) solids;Nectar thick liquid  Medication Administration --  Compensations Minimize environmental distractions;Slow rate;Small sips/bites  Postural Changes Remain semi-upright after after feeds/meals (Comment);Seated upright at 90 degrees         11/08/2022     9:32 AM  Other Recommendations  Oral Care Recommendations Oral care QID  Other Recommendations Order thickener from pharmacy;Prohibited food (jello, ice cream, thin soups);Remove water pitcher;Have oral suction available  Follow Up Recommendations Skilled nursing-short term rehab (<3 hours/day)  Functional Status Assessment Patient has had a recent decline in their functional status and/or demonstrates limited ability to make significant improvements in function in a reasonable and predictable amount of time       11/08/2022    9:32 AM  Frequency and Duration   Speech Therapy Frequency (ACUTE ONLY) min 2x/week         11/08/2022    9:39 AM  Oral Phase  Oral - Nectar Teaspoon Lingual/palatal residue;Premature spillage  Oral - Nectar Cup Premature spillage;Lingual/palatal residue  Oral - Nectar Straw NT  Oral - Thin Teaspoon Lingual/palatal residue;Premature spillage  Oral - Thin Cup Lingual/palatal residue;Premature spillage  Oral - Thin Straw NT  Oral - Puree WFL  Oral - Mech Soft NT  Oral - Regular Impaired mastication       11/08/2022    9:40 AM  Pharyngeal Phase  Pharyngeal- Nectar Teaspoon Delayed swallow initiation-pyriform sinuses;Pharyngeal residue - pyriform;Pharyngeal residue - valleculae;Reduced tongue base retraction;Reduced laryngeal elevation  Pharyngeal Material does not enter airway  Pharyngeal- Nectar Cup Delayed swallow initiation-pyriform sinuses;Reduced airway/laryngeal closure;Penetration/Aspiration during swallow;Pharyngeal residue - valleculae;Pharyngeal residue - pyriform;Reduced laryngeal elevation;Reduced tongue base retraction  Pharyngeal Material enters airway, remains ABOVE vocal cords then ejected out  Pharyngeal- Nectar Straw NT  Pharyngeal- Puree Delayed swallow initiation-vallecula  Pharyngeal Material does not enter airway  Pharyngeal- Mechanical Soft NT  Pharyngeal- Regular Delayed swallow initiation-vallecula  Pharyngeal- Multi-consistency NT         11/08/2022    9:37 AM  Cervical Esophageal Phase   Cervical Esophageal Phase [facility]    Cherrie Gauze, M.S., [facility] [phone] [place])  Quintella Baton 11/08/2022, 9:49 AM

## 2022-11-08 NOTE — Progress Notes (Signed)
Physical Therapy Treatment Patient Details Name: Shawn Jennings MRN: [id number] DOB: [birth year] Today's Date: 11/08/2022   History of Present Illness Patient is a 77 year old male presenting with epigastric pain, anemia. History of left AKA, CAD, heart failure, COPD, CKD, HTN, PAD, A-fib, diabetes, GI bleed.   PT Comments    Patient needs encouragement to participate with PT. He was able to complete posterior scooting in bed with Min guard assistance and cues for technique. However he adamantly refused to attempt getting out of bed with assistance despite encouragement. Sp02 98% with supplemental 02 in place in sitting and appears fatigued with minimal activity. Recommend to continue PT to maximize independence and decrease caregiver burden.    Recommendations for follow up therapy are one component of a multi-disciplinary discharge planning process, led by the attending physician.  Recommendations may be updated based on patient status, additional functional criteria and insurance authorization.  Follow Up Recommendations  Long-term institutional care without follow-up therapy Can patient physically be transported by private vehicle: Yes   Assistance Recommended at Discharge Intermittent Supervision/Assistance  Patient can return home with the following A little help with walking and/or transfers;A little help with bathing/dressing/bathroom;Assist for transportation;Help with stairs or ramp for entrance   Equipment Recommendations  None recommended by PT    Recommendations for Other Services       Precautions / Restrictions Precautions Precautions: Fall Restrictions Weight Bearing Restrictions: No     Mobility  Bed Mobility Overal bed mobility: Needs Assistance Bed Mobility: Supine to Sit, Sit to Supine     Supine to sit: Supervision, HOB elevated Sit to supine: Supervision   General bed mobility comments: increased time and effort required for technique     Transfers Overall transfer level: Needs assistance             Anterior-Posterior transfers: Min guard   General transfer comment: patient completed posterior scooting x 2 bouts with Min guard and cues for task initiation and technique. he refused to get out of the bed despite encouragement. anticipate patient may now need some assistance with transfer to his wheelchair    Ambulation/Gait                   Stairs             Wheelchair Mobility    Modified Rankin (Stroke Patients Only)       Balance Overall balance assessment: Needs assistance Sitting-balance support: Feet supported Sitting balance-Leahy Scale: Good                                      Cognition Arousal/Alertness: Awake/alert Behavior During Therapy: WFL for tasks assessed/performed Overall Cognitive Status: No family/caregiver present to determine baseline cognitive functioning                                 General Comments: patient needed encouragement today to participate with PT. he asks what day it is and seems confused about acute medical issues, upcoming procedures, etc.        Exercises      General Comments General comments (skin integrity, edema, etc.): patient appears fatigued with activity. patient had taken his oxygen off and was sleeping on arrival to room. Sp02 98% in sitting with supplemental oxygen in place      Pertinent Vitals/Pain Pain Assessment  Pain Assessment: No/denies pain    Home Living                          Prior Function            PT Goals (current goals can now be found in the care plan section) Acute Rehab PT Goals Patient Stated Goal: to go home PT Goal Formulation: With patient Time For Goal Achievement: 11/19/22 Potential to Achieve Goals: Fair Progress towards PT goals: Progressing toward goals    Frequency    Min 2X/week      PT Plan Current plan remains appropriate     Co-evaluation              AM-PAC PT "6 Clicks" Mobility   Outcome Measure  Help needed turning from your back to your side while in a flat bed without using bedrails?: None Help needed moving from lying on your back to sitting on the side of a flat bed without using bedrails?: A Little Help needed moving to and from a bed to a chair (including a wheelchair)?: A Little Help needed standing up from a chair using your arms (e.g., wheelchair or bedside chair)?: A Lot Help needed to walk in hospital room?: A Lot Help needed climbing 3-5 steps with a railing? : Total 6 Click Score: 15    End of Session Equipment Utilized During Treatment: Oxygen Activity Tolerance: Patient tolerated treatment well;Patient limited by fatigue Patient left: in bed;with call bell/phone within reach;with bed alarm set Nurse Communication: Mobility status PT Visit Diagnosis: Muscle weakness (generalized) (M62.81);Difficulty in walking, not elsewhere classified (R26.2)     Time: 1121-6244 PT Time Calculation (min) (ACUTE ONLY): 12 min  Charges:  $Therapeutic Activity: 8-22 mins                     Minna Merritts, PT, MPT    [name] 11/08/2022, 3:41 PM

## 2022-11-08 NOTE — Anesthesia Postprocedure Evaluation (Signed)
Anesthesia Post Note  Patient: Shawn Jennings  Procedure(s) Performed: ESOPHAGOGASTRODUODENOSCOPY (EGD) WITH PROPOFOL COLONOSCOPY WITH PROPOFOL  Patient location during evaluation: Endoscopy Anesthesia Type: General Level of consciousness: awake and alert Pain management: pain level controlled Vital Signs Assessment: post-procedure vital signs reviewed and stable Respiratory status: spontaneous breathing, nonlabored ventilation and respiratory function stable Cardiovascular status: blood pressure returned to baseline and stable Postop Assessment: no apparent nausea or vomiting Anesthetic complications: no   No notable events documented.   Last Vitals:  Vitals:   11/08/22 0029 11/08/22 0446  BP: 126/79 116/71  Pulse: 84 73  Resp: 20 19  Temp: 36.8 C 36.7 C  SpO2: 91% 94%    Last Pain:  Vitals:   11/07/22 2300  TempSrc:   PainSc: 0-No pain                 [place] Ouch

## 2022-11-09 DIAGNOSIS — D649 Anemia, unspecified: Secondary | ICD-10-CM | POA: Diagnosis not present

## 2022-11-09 DIAGNOSIS — D500 Iron deficiency anemia secondary to blood loss (chronic): Secondary | ICD-10-CM | POA: Diagnosis not present

## 2022-11-09 LAB — TYPE AND SCREEN
ABO/RH(D): B POS
Antibody Screen: NEGATIVE
Unit division: 0

## 2022-11-09 LAB — BPAM RBC
Blood Product Expiration Date: 202401272359
ISSUE DATE / TIME: 202401181234
Unit Type and Rh: 1700

## 2022-11-09 LAB — HEMOGLOBIN AND HEMATOCRIT, BLOOD
HCT: 26.8 % — ABNORMAL LOW (ref 39.0–52.0)
Hemoglobin: 8.2 g/dL — ABNORMAL LOW (ref 13.0–17.0)

## 2022-11-09 MED ORDER — SODIUM CHLORIDE 0.9 % IV SOLN: INTRAVENOUS | Status: DC

## 2022-11-09 MED ORDER — POLYETHYLENE GLYCOL 3350 17 GM/SCOOP PO POWD
1.0000 | Freq: Once | ORAL | Status: DC
  Filled 2022-11-09: qty 255

## 2022-11-09 NOTE — Progress Notes (Signed)
Pt supposed to have colonoscopy tomorrow but pt can't have thin liquid because of aspiration risk. Gastroenterologist suggested NG tube for bowel prep, but pt refused to have NG tube. Attending MD and GI MD both made aware

## 2022-11-09 NOTE — Progress Notes (Signed)
Speech Language Pathology Treatment: Dysphagia  Patient Details Name: Shawn Jennings MRN: [id number] DOB: [birth year] Today's Date: 11/09/2022 Time: 5726-2035 SLP Time Calculation (min) (ACUTE ONLY): 30 min  Assessment / Plan / Recommendation Clinical Impression  Pt seen for today ongoing toleration of diet s/p MBSS and modification of diet to Nectar liquids. Pt was alert and verbal, resting in bed. Stated he "wanted food" -- this was relayed to GI.  Unsure of pt's Baseline Cognitive status and accuracy of reporting -- GI/MD suggested pt has Cognitive decline/Dementia.  He required min verbal cues for orientation/awareness to events and for follow through w/ tasks. Engaged appropriately and could answer basic questions re: self and follow 1-step commands. Upper Denture plate in place; no bottom Dentition.  On [place] O2 1L; afebrile, WBC 11.0.   OF NOTE: pt is still Pending GI procedure and remains on a clear liquids diet -- Nectar consistency. ALSO: education and discussion on his MBSS results from yesterday re: his swallowing was had -- he could not recount the results of the MBSS nor the recommendations but stated he was doing "fine" w/ the thickened liquids.  PER AN OUTPATIENT PALLIATIVE CARE NOTE, PT HAS A H/O "ASPIRATION PNEUMONIA" LISTED IN THE NOTE.    Pt presents w/ pharyngeal phase dysphagia in setting of SILENT aspiration of thin liquids per MBSS; delayed pharyngeal swallow initiation. A more timely pharyngeal swallow w/out aspiration was noted w/ Nectar consistency liquids. During tx session, pt consumed trials of Nectar liquids via Cup this tx session w/ No respiratory status/effort decline noted; no coughing. Timely oral phase management and clearing of boluses noted. Discussed his MBSS results and the complication of aspiration of thin liquids including aspiration pneumonia.    In setting of pt's illness/hospitalization, baseline/CHRONIC medical issues including GI bleed and Pulmonary  decline and aspiration pneumonia, oropharyngeal phase Dysphagia, and current weakness/deconditioning, pt appears at increased risk for aspiration/aspiration pneumonia and is recommended to remain on a Dysphagia diet w/ Nectar liquids for safer swallowing; aspiration precautions.    Recommend diet modification to Dysphagia level 3 w/ moistened foods d/t lacking bottom Dentition (when GI ready); Nectar consistency liquids. Recommend aspiration precautions of SINGLE, SMALL sips via CUP. Pills WHOLE vs Crushed in Puree for safer, easier swallowing. Tray setup and support w/ meals; positioning support d/t weakness. Less distractions and talking at meals. Rest Breaks during meals for conservation of energy.  Incentive spirometer given for Pulmonary ex/support given his more sedentary status.    Education provided to pt on above including the dysphagia diet; food and Nectar consistencies and easy to eat options; general aspiration precautions; need for Rest Breaks during meals to calm breathing; GERD precautions; pills in puree; oral care. Uncertain of pt's follow through w/ strategies and recommendations however d/t Cognitive decline -- Supervision during meals may be needed. Pt agreed. GI MD/NSG updated.        HPI HPI: Pt is a 77 y.o. male  w/ past medical history of CAD, heart failure, CKD, CABG, COPD, oxygen supplementation at night 2L, etoh, depression, hypertension, hyperlipidemia, peripheral artery disease, paroxysmal atrial fibrillation on Eliquis, diabetes, left leg amputation presents with epigastric pain for the past 4 days.  Denies nausea or vomiting, denies GI bleeding.  Some exertional shortness of breath and generalized weakness.     Of note he has had upper GI bleeding back in 2022 which showed gastropathy.   CXR: No active cardiopulmonary disease. Streaky atelectasis or scarring  at the bases.  [facility] [place].   OF NOTE: receives Palliative Care services baseline.  MD/GI  suggested pt may have Cognitive decline/Dementia.      SLP Plan  Continue with current plan of care (as needed during admit)      Recommendations for follow up therapy are one component of a multi-disciplinary discharge planning process, led by the attending physician.  Recommendations may be updated based on patient status, additional functional criteria and insurance authorization.    Recommendations  Diet recommendations: Nectar-thick liquid (clear liquids diet per GI currently -- dysphagia level 3(chopped, moistened) when GI ready) Liquids provided via: Cup;No straw Medication Administration: Whole meds with puree Supervision: Patient able to self feed;Intermittent supervision to cue for compensatory strategies Compensations: Minimize environmental distractions;Slow rate;Small sips/bites Postural Changes and/or Swallow Maneuvers: Out of bed for meals;Seated upright 90 degrees;Upright 30-60 min after meal                General recommendations:  (Dietician f/u; Palliative Care f/u for [name]) Oral Care Recommendations: Oral care BID;Oral care before and after PO;Staff/trained caregiver to provide oral care (denture care) Follow Up Recommendations: Skilled nursing-short term rehab (<3 hours/day) (TBD) Assistance recommended at discharge: Intermittent Supervision/Assistance SLP Visit Diagnosis: Dysphagia, oropharyngeal phase (R13.12) Plan: Continue with current plan of care (as needed during admit)              Shawn Jennings, [place], [place]; [place] [phone] (ascom) Shawn Jennings  11/09/2022, 2:25 PM

## 2022-11-09 NOTE — Progress Notes (Signed)
PROGRESS NOTE    Shawn Jennings  MLY:[id number] DOB: 1946/05/17 DOA: 10/31/2022 PCP: Pcp, No   Brief Narrative:  This 77 yrs old male with PMH significant of Parox Afib on anticoagulation w/ Eliquis, DM2, CKD3a, CAD, Ischemic cardiomyopathy, HFrEF, GERD, Hx. 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  Patient presented in the ED with mild epigastric abdominal pain associated with anemia, hemoglobin 6.3 in ED . Patient has received 3 units of PRBC so far.  CTA negative for any active GI bleeding.  Patient is admitted for further evaluation.  GI is consulted.  Patient needed to be off of Eliquis for 2 days.  Patient underwent EGD on 11/06/2021 showed nonbleeding gastric ulcer, did not have preparation for colonoscopy.  SNF is pending,  TOC consulted to reach out to DSS regarding guardianship given patient's limited capacity.  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  He has received 4 units of PRBC so far.  Hemoglobin dropped to 6.9 on 11/08/22 .  Transfused 1 PRBC, Hb 8.2 Hold anticoagulation in the setting of GI bleed. Monitor H/ H q12hr, Transfuse PRBC if Hb below 7.0 GI consulted.  Patient underwent EGD 11/06/2022. EGD shows nonbleeding gastric ulcer, Patient refused preparation for colonoscopy 11/07/2022. Patient agreed for colonoscopy, GI. Notified.  Patient has not completed preparation for colonoscopy. Colonoscopy is rescheduled for tomorrow.  Will continue prep for colonoscopy today.  Chronic heart failure with reduced EF: Does not appear in exacerbation. Blood pressure remains soft. Holding home medications, Entresto, Lasix, spironolactone, Imdur, beta-blocker. Resume when blood pressure improves.  Paroxysmal atrial fibrillation: Heart rate is now well-controlled. Eliquis is on hold in the setting of GI bleed.   Resume Eliquis once cleared by GI.  Essential hypertension: Blood pressure is soft, hold blood pressure medications.  Hyperlipidemia Continue Lipitor 80 mg daily.  Coronary artery disease: Ischemic cardiomyopathy: Continue Lipitor, Hold Imdur, Entresto due to low BP. He denies any chest pain, palpitations.  CKD stage IIIa: Metabolic acidosis : Continue sodium bicarbonate  Monitor serum creatinine, avoid nephrotoxic medications.  Cognitive impairment: Memory deficit: Patient does have some memory deficits, cognitive impairment. Have consulted TOC regarding getting him set up with DSS guardian.    Depression: Continue Cymbalta and lamotrigine.  Left AKA: Stable.  PT and OT when able.  COPD.: Stable, continue home medications.  Not in exacerbation.  Syncope: He was hemodynamically stable.  CT head negative, ammonia level 17, ABG pCO2 34. Patient woke up after having CT head.  EKG normal sinus rhythm. Workup negative.    DVT prophylaxis: None  at this time. Code Status: Full code Family Communication: No family at bedside Disposition Plan:   Status is: Inpatient Remains inpatient  appropriate because: GI evaluation for bleeding.  EGD shows nonbleeding gastric ulcers, colonoscopy is pending.   Consultants:  Gastroenterology  Procedures: 11/06/2022 showed nonbleeding gastric ulcer  Antimicrobials: Anti-infectives (From admission, onward)    Start     Dose/Rate Route Frequency Ordered Stop   11/01/22 2300  azithromycin (ZITHROMAX) 500 mg in sodium chloride 0.9 % 250 mL IVPB  Status:  Discontinued        500 mg 250 mL/hr over 60 Minutes Intravenous Every 24 hours 11/01/22 0107 11/01/22 0748   11/01/22 2200  cefTRIAXone (ROCEPHIN) 1 g in sodium chloride 0.9 % 100 mL IVPB  Status:  Discontinued        1 g 200 mL/hr over 30 Minutes Intravenous Every 24 hours 11/01/22 0107 11/01/22 0748   11/01/22 0100  cefTRIAXone (ROCEPHIN) 1 g in sodium chloride 0.9 % 100 mL IVPB  Status:  Discontinued        1 g 200 mL/hr over 30 Minutes Intravenous Every 24 hours 11/01/22 0056 11/01/22 0107   11/01/22 0100  azithromycin (ZITHROMAX) 500 mg in sodium chloride 0.9 % 250 mL IVPB  Status:  Discontinued        500 mg 250 mL/hr over 60 Minutes Intravenous Every 24 hours 11/01/22 0056 11/01/22 0107   10/31/22 2115  cefTRIAXone (ROCEPHIN) 1 g in sodium chloride 0.9 % 100 mL IVPB        1 g 200 mL/hr over 30 Minutes Intravenous  Once 10/31/22 2111 10/31/22 2333   10/31/22 2115  azithromycin (ZITHROMAX) 500 mg in sodium chloride 0.9 % 250 mL IVPB        500 mg 250 mL/hr over 60 Minutes Intravenous  Once 10/31/22 2111 11/01/22 0055      Subjective: Patient was seen and examined at bedside.Overnight events noted. Patient reports doing better.He denies any chest pain or shortness of breath. Patient has not completed preparation for colonoscopy. GI is notified  and colonoscopy rescheduled for tomorrow.   Hemoglobin 8.2 after 1 unit of PRBC.  Denies any obvious visible bleeding.   Objective: Vitals:   11/08/22 1729 11/08/22 2009 11/09/22 0458 11/09/22 0740  BP: 128/76 122/83 111/76  120/75  Pulse: 76 77 71 72  Resp: '18 19 18 16  '$ Temp: 98 F (36.7 C) 98 F (36.7 C) 97.6 F (36.4 C) 97.9 F (36.6 C)  TempSrc: Oral Oral Oral   SpO2: 94% 100% 94% 98%  Weight:      Height:        Intake/Output Summary (Last 24 hours) at 11/09/2022 1041 Last data filed at 11/08/2022 1513 Gross per 24 hour  Intake 357.33 ml  Output 350 ml  Net 7.33 ml   Filed Weights   10/31/22 1720  Weight: 73 kg    Examination:  General exam: Appears comfortable, not in any acute distress.  Deconditioned. Respiratory system: CTA bilaterally, respiratory effort normal, no accessory muscle use, RR 15 Cardiovascular system: S1 & S2 heard, regular rate and rhythm, no murmur. Gastrointestinal system: Abdomen is soft, non tender, non distended, BS+ Central nervous system: Alert and oriented x 3 . No focal neurological deficits. Extremities: Left AKA, no edema, no cyanosis, no clubbing. Skin: No rashes, lesions or ulcers Psychiatry: Judgement and insight appear normal. Mood & affect appropriate.     Data Reviewed: I have personally reviewed following labs and imaging studies  CBC: Recent Labs  Lab 11/05/22 0506 11/06/22 0402 11/07/22 0528 11/07/22 0820 11/08/22 0314 11/09/22 0840  WBC 11.8* 10.5 9.8 8.9 11.0*  --   NEUTROABS  --   --   --  6.1  --   --   HGB 8.7* 8.2* 7.4* 7.7* 6.9* 8.2*  HCT 29.2* 27.4* 24.8* 26.1* 23.6* 26.8*  MCV 85.9 86.4 86.1 85.9 86.4  --   PLT 309 315 301 298 332  --    Basic Metabolic Panel: Recent Labs  Lab 11/05/22 0506 11/06/22 0402 11/07/22 0528 11/07/22 0820 11/08/22 0314  NA 140 139 138 138 138  K 4.2 4.4 4.2 4.1 4.3  CL 112* 111 111 109 109  CO2 22 23 20* 21* 20*  GLUCOSE 127* 119* 126* 129* 157*  BUN 23 24* 24* 24* 29*  CREATININE 1.22 1.24 1.19 1.30* 1.30*  CALCIUM 7.9* 8.2* 8.0* 8.0* 7.9*  MG  --   --  2.2  --  2.3  PHOS  --   --   --   --  4.3   GFR: Estimated Creatinine Clearance: 48.4 mL/min (A) (by C-G formula based on SCr of  1.3 mg/dL (H)). Liver Function Tests: Recent Labs  Lab 11/06/22 0402  AST 11*  ALT 10  ALKPHOS 61  BILITOT 0.7  PROT 5.4*  ALBUMIN 2.3*   No results for input(s): "LIPASE", "AMYLASE" in the last 168 hours.  Recent Labs  Lab 11/07/22 0820  AMMONIA 17   Coagulation Profile: No results for input(s): "INR", "PROTIME" in the last 168 hours.  Cardiac Enzymes: No results for input(s): "CKTOTAL", "CKMB", "CKMBINDEX", "TROPONINI" in the last 168 hours. BNP (last 3 results) No results for input(s): "PROBNP" in the last 8760 hours. HbA1C: No results for input(s): "HGBA1C" in the last 72 hours. CBG: Recent Labs  Lab 11/07/22 0758 11/07/22 0846  GLUCAP 129* 230*   Lipid Profile: No results for input(s): "CHOL", "HDL", "LDLCALC", "TRIG", "CHOLHDL", "LDLDIRECT" in the last 72 hours. Thyroid Function Tests: Recent Labs    11/07/22 1210  TSH 1.530  T4TOTAL 6.9   Anemia Panel: No results for input(s): "VITAMINB12", "FOLATE", "FERRITIN", "TIBC", "IRON", "RETICCTPCT" in the last 72 hours. Sepsis Labs: Recent Labs  Lab 11/07/22 0820  LATICACIDVEN 1.3    No results found for this or any previous visit (from the past 240 hour(s)).  Radiology Studies: DG Swallowing Func-Speech Pathology  Result Date: 11/08/2022 Calla Kicks     11/08/2022  9:54 AM Objective Swallowing Evaluation: Type of Study: MBS-Modified Barium Swallow Study  Patient Details Name: Shawn Jennings MRN: [id number] Date of Birth: 11-10-1945 Today's Date: 11/08/2022 Time: SLP Start Time (ACUTE ONLY): 0805 -SLP Stop Time (ACUTE ONLY): 0850 SLP Time Calculation (min) (ACUTE ONLY): 45 min Past Medical History: Past Medical History: Diagnosis Date  Acquired dilation of ascending aorta and aortic root ([place])   a. 06/2022 Echo: Asc Ao 58m.  Aortic stenosis   a. Pt unaware of history but CT imaging 01/2019 consistent w/ AVR; b. 01/2019 Echo: Mild to mod AS. Mean grad 112mg; c. 06/2022 Echo: Nl fxn'ing prosth AoV. Asc  Ao 4055m CAD (coronary artery disease)   a. [place] 05[place]2020 Cath: Sev 3VD, patent grafts->Med Rx; c. 06/2022 NSTEMI/PCI: LM diff dzs, LAD 100m56m 40, LCX 100p/m, OM3 mod dzs, RCA 100p, LIMA->LAD ok, RIMA->RPDA 40ost, VG->D2 30p/95p (3.0x22 Onyx Frontier DES)--< L Radial-OM3 20ost.  Chronic HFrEF (heart failure with reduced ejection fraction) (HCC)[place]  a. 01/2019 Echo: EF 40-45%; b. 04/2020 Echo: EF 25-30%; c. 09/2020 Echo: EF 50-55%; d. 07/2021 Echo: EF 20-25%; e. 06/2022 Echo: EF 20-25%, glob HK, mild LVH, GrII DD, nl RV fxn, nl fxn'ing bioprosth AoV, Asc Ao 69m.  CKD (chronic kidney disease), stage III (HCC)   COPD (chronic obstructive pulmonary disease) ([place]   Depression   Essential hypertension   GERD (gastroesophageal reflux disease)   Hyperlipidemia LDL goal <70   Ischemic cardiomyopathy   a. 01/2019 Echo: EF 40-45%; b. 04/2020 Echo: EF 25-30%; c. 09/2020 Echo: EF 50-55%; d. 07/2021 Echo: EF 20-25%; e. 06/2022 Echo: EF 20-25%.  PAD (peripheral artery disease) ([place]   a. 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  PAF (paroxysmal atrial fibrillation) (HCC)   a. CHA2DS2VASc = 4-->Xarelto '15mg'$  daily in setting of CKD.  Thyrotoxicosis   Tobacco abuse   Type II diabetes mellitus ([place]  Past Surgical History: Past Surgical History: Procedure Laterality Date  CARDIAC CATHETERIZATION    CHOLECYSTECTOMY    COLONOSCOPY    COLONOSCOPY WITH PROPOFOL N/A 07/20/2021  Procedure: COLONOSCOPY WITH PROPOFOL;  Surgeon: VLin Landsman MD;  [facility];  Service: Gastroenterology;  Laterality: N/A;  COLONOSCOPY WITH PROPOFOL N/A 11/06/2022  Procedure: COLONOSCOPY WITH PROPOFOL;  Surgeon: WLucilla Lame MD;  [facility];  Service: Endoscopy;  Laterality: N/A;  CORONARY ARTERY BYPASS GRAFT    a. [place] CORONARY STENT INTERVENTION N/A 07/18/2022  Procedure: CORONARY STENT INTERVENTION;  Surgeon: ENelva Bush MD;  [place] [facility];  Service: Cardiovascular;  Laterality: N/A;  ENDOSCOPIC  RETROGRADE CHOLANGIOPANCREATOGRAPHY (ERCP) WITH PROPOFOL N/A 10/19/2020  Procedure: ENDOSCOPIC RETROGRADE CHOLANGIOPANCREATOGRAPHY (ERCP) WITH PROPOFOL;  Surgeon: WLucilla Lame MD;  Location: [facility] ENDOSCOPY;  Service: Endoscopy;  Laterality: N/A;  ERCP N/A 12/29/2020  Procedure: ENDOSCOPIC RETROGRADE CHOLANGIOPANCREATOGRAPHY (ERCP);  Surgeon: WLucilla Lame MD;  [facility];  Service: Endoscopy;  Laterality: N/A;  ESOPHAGOGASTRODUODENOSCOPY (EGD) WITH PROPOFOL N/A 07/18/2021  Procedure: ESOPHAGOGASTRODUODENOSCOPY (EGD) WITH PROPOFOL;  Surgeon: VLin Landsman MD;  [facility];  Service: Gastroenterology;  Laterality: N/A;  ESOPHAGOGASTRODUODENOSCOPY (EGD) WITH PROPOFOL N/A 11/06/2022  Procedure: ESOPHAGOGASTRODUODENOSCOPY (EGD) WITH PROPOFOL;  Surgeon: WLucilla Lame MD;  Location: [facility] ENDOSCOPY;  Service: Endoscopy;  Laterality: N/A;  Left AKA    RIGHT HEART CATH AND CORONARY/GRAFT ANGIOGRAPHY N/A 07/18/2022  Procedure: RIGHT HEART CATH AND CORONARY/GRAFT ANGIOGRAPHY;  Surgeon: ENelva Bush MD;  [place];  Service: Cardiovascular;  Laterality: N/A;  RIGHT/LEFT HEART CATH AND CORONARY ANGIOGRAPHY N/A 06/06/2020  Procedure: RIGHT/LEFT HEART CATH AND CORONARY ANGIOGRAPHY;  Surgeon: AWellington Hampshire MD;  [place] [facility];  Service: Cardiovascular;  Laterality: N/A; HPI: Pt is a 77y.o. male  w/ past medical history of CAD, heart failure, CKD, CABG, COPD, oxygen supplementation at night 2L, etoh, depression, hypertension, hyperlipidemia, peripheral artery disease, paroxysmal atrial fibrillation on Eliquis, diabetes, left leg amputation presents with epigastric pain for the past 4 days.  Denies nausea or vomiting, denies GI bleeding.  Some exertional shortness of breath and generalized weakness.     Of note he has had upper GI bleeding back in 2022 which showed gastropathy.   CXR: No active cardiopulmonary disease. Streaky atelectasis or scarring  at  the bases.  [facility]   OF NOTE: receives Palliative Care services baseline.  Subjective: Pt alert, pleasant, and cooperative. Denies hx of dysphagia, but endorses chronic cough which pt attributes to smoking.  Recommendations for follow up therapy are  one component of a multi-disciplinary discharge planning process, led by the attending physician.  Recommendations may be updated based on patient status, additional functional criteria and insurance authorization. Assessment / Plan / Recommendation   11/08/2022   9:43 AM Clinical Impressions Clinical Impression Pt seen for MBSS. Pt alert, pleasant, and cooperative. hx of dysphagia, but endorses chronic cough which pt attributes to smoking. On 2L/min O2 via [place]. Pt given trials of solid, pureed, nectar-thick liquids (via tsp, cup), and thin liquids (via tsp, cup). Lordotic spinal curvature appreciated.  Pt presents with a mild oropharyngeal dysphagia. Oral phase notable for trace oral residual across consistencies and prolonged mastication of solids. Pharyngeal phase notable for trace-mild pharyngeal stasis (vallecular, pyriform sinus, and posterior pharyngeal wall), swallow initiation at the vallecula and/or pyriform sinus, transient during the swallow penetration of nectar-thick liquids via cup, and SILENT before the swallow aspiration of thin liquids via cup. Above mentioned deficits secondary to: xerostomia, reduced base of tongue retraction, reduced hyolaryngeal elevation, reduced/mistimed laryngeal vesitbule closure, reduced pharyngeal stripping wave, and reduced laryngeal sensation. Recommend a mech soft diet with nectar-thick liquids with safe swallowing strategies/aspiration precautions as outlined below. Education: Pt educated at [employer] re: diet recommendations, safe swallowing strategies/aspiration precautions, results of assessment, rationale for thickened liquids, risks of aspiration/aspiration PNA, relationship between  breathing/swallowing, and SLP POC. Pt shown videofluoroscopic images on TIMS after study was completed. Pt asked pertinent questions and verbalized satisfaction with answers provided by SLP. Sign posted in pt's room with diet recommendations and safe swallowing strategies/aspiration precautions. RN made aware of results, recommendations and SLP POC. SLP POC: SLP to f/u per POC for diet tolerance and follow up education. Recommend post-acute SLP services for dysphagia tx.     11/08/2022   9:32 AM Treatment Recommendations Treatment Recommendations Therapy as outlined in treatment plan below     11/08/2022   9:33 AM Prognosis Prognosis for Safe Diet Advancement Fair Barriers to Reach Goals Cognitive deficits;Time post onset;Severity of deficits   11/08/2022   9:32 AM Diet Recommendations SLP Diet Recommendations Dysphagia 3 (Mech soft) solids;Nectar thick liquid Medication Administration -- Compensations Minimize environmental distractions;Slow rate;Small sips/bites Postural Changes Remain semi-upright after after feeds/meals (Comment);Seated upright at 90 degrees     11/08/2022   9:32 AM Other Recommendations Oral Care Recommendations Oral care QID Other Recommendations Order thickener from pharmacy;Prohibited food (jello, ice cream, thin soups);Remove water pitcher;Have oral suction available Follow Up Recommendations Skilled nursing-short term rehab (<3 hours/day) Functional Status Assessment Patient has had a recent decline in their functional status and/or demonstrates limited ability to make significant improvements in function in a reasonable and predictable amount of time   11/08/2022   9:32 AM Frequency and Duration  Speech Therapy Frequency (ACUTE ONLY) min 2x/week     11/08/2022   9:39 AM Oral Phase Oral - Nectar Teaspoon Lingual/palatal residue;Premature spillage Oral - Nectar Cup Premature spillage;Lingual/palatal residue Oral - Nectar Straw NT Oral - Thin Teaspoon Lingual/palatal residue;Premature spillage  Oral - Thin Cup Lingual/palatal residue;Premature spillage Oral - Thin Straw NT Oral - Puree WFL Oral - Mech Soft NT Oral - Regular Impaired mastication    11/08/2022   9:40 AM Pharyngeal Phase Pharyngeal- Nectar Teaspoon Delayed swallow initiation-pyriform sinuses;Pharyngeal residue - pyriform;Pharyngeal residue - valleculae;Reduced tongue base retraction;Reduced laryngeal elevation Pharyngeal Material does not enter airway Pharyngeal- Nectar Cup Delayed swallow initiation-pyriform sinuses;Reduced airway/laryngeal closure;Penetration/Aspiration during swallow;Pharyngeal residue - valleculae;Pharyngeal residue - pyriform;Reduced laryngeal elevation;Reduced tongue base retraction Pharyngeal Material enters airway, remains ABOVE vocal cords then  ejected out Pharyngeal- Nectar Straw NT Pharyngeal- Puree Delayed swallow initiation-vallecula Pharyngeal Material does not enter airway Pharyngeal- Mechanical Soft NT Pharyngeal- Regular Delayed swallow initiation-vallecula Pharyngeal- Multi-consistency NT    11/08/2022   9:37 AM Cervical Esophageal Phase  Cervical Esophageal Phase [facility] Cherrie Gauze, M.S., [facility] [phone] [place] Denis) Quintella Baton 11/08/2022, 9:49 AM                      Scheduled Meds:  atorvastatin  80 mg Oral QHS   cholecalciferol  1,000 Units Oral Daily   cyanocobalamin  1,000 mcg Oral Daily   DULoxetine  30 mg Oral BID   ferrous sulfate  325 mg Oral QODAY   lamoTRIgine  25 mg Oral BID   loratadine  10 mg Oral Daily   melatonin  5 mg Oral QHS   montelukast  10 mg Oral QHS   nicotine  7 mg Transdermal Daily   pantoprazole  40 mg Oral Daily   sodium bicarbonate  650 mg Oral TID   sodium chloride flush  3 mL Intravenous Q12H   Continuous Infusions:   LOS: 8 days    Time spent: 35 mins    Damek Ende, MD [facility]   If 7PM-7AM, please contact night-coverage

## 2022-11-09 NOTE — Care Management Important Message (Signed)
Important Message  Patient Details  Name: Odin Mariani MRN: [id number] Date of Birth: Apr 05, 1946   Medicare Important Message Given:  Yes     Juliann Pulse A Ellouise Mcwhirter 11/09/2022, 2:17 PM

## 2022-11-09 NOTE — Progress Notes (Signed)
Shawn Jennings , MD [address], [place], [place], [place], [address] [address], [place], [place], [place], [address] Phone: [phone]  Fax: [phone]   Shawn Jennings is being followed for iron deficiency anemia   Subjective: No bowel movements, took only two glasses of prep yesterday    Objective: Vital signs in last 24 hours: Vitals:   11/08/22 1729 11/08/22 2009 11/09/22 0458 11/09/22 0740  BP: 128/76 122/83 111/76 120/75  Pulse: 76 77 71 72  Resp: '18 19 18 16  '$ Temp: 98 F (36.7 C) 98 F (36.7 C) 97.6 F (36.4 C) 97.9 F (36.6 C)  TempSrc: Oral Oral Oral   SpO2: 94% 100% 94% 98%  Weight:      Height:       Weight change:   Intake/Output Summary (Last 24 hours) at 11/09/2022 1005 Last data filed at 11/08/2022 1513 Gross per 24 hour  Intake 357.33 ml  Output 350 ml  Net 7.33 ml     Exam: Heart:: Regular rate and rhythm Lungs: normal Abdomen: soft, nontender, normal bowel sounds   Lab Results: '@LABTEST2'$ @ Micro Results: No results found for this or any previous visit (from the past 240 hour(s)). Studies/Results: DG Swallowing Func-Speech Pathology  Result Date: 11/08/2022 Shawn Jennings     11/08/2022  9:54 AM Objective Swallowing Evaluation: Type of Study: MBS-Modified Barium Swallow Study  Patient Details Name: Shawn Jennings MRN: [id number] Date of Birth: 03/26/1946 Today's Date: 11/08/2022 Time: SLP Start Time (ACUTE ONLY): 0805 -SLP Stop Time (ACUTE ONLY): 0850 SLP Time Calculation (min) (ACUTE ONLY): 45 min Past Medical History: Past Medical History: Diagnosis Date  Acquired dilation of ascending aorta and aortic root ([place])   a. 06/2022 Echo: Asc Ao 69m.  Aortic stenosis   a. Pt unaware of history but CT imaging 01/2019 consistent w/ AVR; b. 01/2019 Echo: Mild to mod AS. Mean grad 130mg; c. 06/2022 Echo: Nl fxn'ing prosth AoV. Asc Ao 4055m CAD (coronary artery disease)   a. [place] 05[place] Cath: Sev 3VD, patent  grafts->Med Rx; c. 06/2022 NSTEMI/PCI: LM diff dzs, LAD 100m46m 40, LCX 100p/m, OM3 mod dzs, RCA 100p, LIMA->LAD ok, RIMA->RPDA 40ost, VG->D2 30p/95p (3.0x22 Onyx Frontier DES)--< L Radial-OM3 20ost.  Chronic HFrEF (heart failure with reduced ejection fraction) ([place]. 01/2019 Echo: EF 40-45%; b. 04/2020 Echo: EF 25-30%; c. 09/2020 Echo: EF 50-55%; d. 07/2021 Echo: EF 20-25%; e. 06/2022 Echo: EF 20-25%, glob HK, mild LVH, GrII DD, nl RV fxn, nl fxn'ing bioprosth AoV, Asc Ao 40mm60mKD (chronic kidney disease), stage III (HCC)   COPD (chronic obstructive pulmonary disease) (HCC) [place]   Essential hypertension   GERD (gastroesophageal reflux disease)   Hyperlipidemia LDL goal <70   Ischemic cardiomyopathy   a. 01/2019 Echo: EF 40-45%; b. 04/2020 Echo: EF 25-30%; c. 09/2020 Echo: EF 50-55%; d. 07/2021 Echo: EF 20-25%; e. 06/2022 Echo: EF 20-25%.  PAD (peripheral artery disease) (HCC) [place]. 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  PAF (paroxysmal atrial fibrillation) (HCC)   a. CHA2DS2VASc = 4-->Xarelto '15mg'$  daily in setting of CKD.  Thyrotoxicosis   Tobacco abuse   Type II diabetes mellitus (HCC) [place] Surgical History: Past Surgical History: Procedure Laterality Date  CARDIAC CATHETERIZATION    CHOLECYSTECTOMY    COLONOSCOPY    COLONOSCOPY WITH PROPOFOL N/A 07/20/2021  Procedure: COLONOSCOPY WITH PROPOFOL;  Surgeon: VangaLin Landsman  [facility];  Service: Gastroenterology;  Laterality: N/A;  COLONOSCOPY WITH PROPOFOL N/A 11/06/2022  Procedure: COLONOSCOPY WITH PROPOFOL;  Surgeon: [name], MD;  Location: [facility]COPY;  Service: Endoscopy;  Laterality: N/A;  CORONARY ARTERY BYPASS GRAFT    a. [place], [place]  CORONARY STENT INTERVENTION N/A 07/18/2022  Procedure: CORONARY STENT INTERVENTION;  Surgeon: Nelva Bush, MD;  Location: [facility] CV LAB;  Service: Cardiovascular;  Laterality: N/A;  ENDOSCOPIC RETROGRADE CHOLANGIOPANCREATOGRAPHY (ERCP) WITH PROPOFOL N/A 10/19/2020  Procedure: ENDOSCOPIC  RETROGRADE CHOLANGIOPANCREATOGRAPHY (ERCP) WITH PROPOFOL;  Surgeon: [name], MD;  Location: [facility] ENDOSCOPY;  Service: Endoscopy;  Laterality: N/A;  ERCP N/A 12/29/2020  Procedure: ENDOSCOPIC RETROGRADE CHOLANGIOPANCREATOGRAPHY (ERCP);  Surgeon: [name], MD;  Location: [facility];  Service: Endoscopy;  Laterality: N/A;  ESOPHAGOGASTRODUODENOSCOPY (EGD) WITH PROPOFOL N/A 07/18/2021  Procedure: ESOPHAGOGASTRODUODENOSCOPY (EGD) WITH PROPOFOL;  Surgeon: Lin Landsman, MD;  Location: [facility] ENDOSCOPY;  Service: Gastroenterology;  Laterality: N/A;  ESOPHAGOGASTRODUODENOSCOPY (EGD) WITH PROPOFOL N/A 11/06/2022  Procedure: ESOPHAGOGASTRODUODENOSCOPY (EGD) WITH PROPOFOL;  Surgeon: [name], MD;  Location: [facility] ENDOSCOPY;  Service: Endoscopy;  Laterality: N/A;  Left AKA    RIGHT HEART CATH AND CORONARY/GRAFT ANGIOGRAPHY N/A 07/18/2022  Procedure: RIGHT HEART CATH AND CORONARY/GRAFT ANGIOGRAPHY;  Surgeon: Nelva Bush, MD;  Location: [place] CV LAB;  Service: Cardiovascular;  Laterality: N/A;  RIGHT/LEFT HEART CATH AND CORONARY ANGIOGRAPHY N/A 06/06/2020  Procedure: RIGHT/LEFT HEART CATH AND CORONARY ANGIOGRAPHY;  Surgeon: Wellington Hampshire, MD;  Location: [place] [facility] CV LAB;  Service: Cardiovascular;  Laterality: N/A; HPI: Pt is a 77 y.o. male  w/ past medical history of CAD, heart failure, CKD, CABG, COPD, oxygen supplementation at night 2L, etoh, depression, hypertension, hyperlipidemia, peripheral artery disease, paroxysmal atrial fibrillation on Eliquis, diabetes, left leg amputation presents with epigastric pain for the past 4 days.  Denies nausea or vomiting, denies GI bleeding.  Some exertional shortness of breath and generalized weakness.     Of note he has had upper GI bleeding back in 2022 which showed gastropathy.   CXR: No active cardiopulmonary disease. Streaky atelectasis or scarring  at the bases.  He resides at a [employer] [facility].   OF NOTE: receives Palliative Care services  baseline.  Subjective: Pt alert, pleasant, and cooperative. Denies hx of dysphagia, but endorses chronic cough which pt attributes to smoking.  Recommendations for follow up therapy are one component of a multi-disciplinary discharge planning process, led by the attending physician.  Recommendations may be updated based on patient status, additional functional criteria and insurance authorization. Assessment / Plan / Recommendation   11/08/2022   9:43 AM Clinical Impressions Clinical Impression Pt seen for MBSS. Pt alert, pleasant, and cooperative. hx of dysphagia, but endorses chronic cough which pt attributes to smoking. On 2L/min O2 via [place]. Pt given trials of solid, pureed, nectar-thick liquids (via tsp, cup), and thin liquids (via tsp, cup). Lordotic spinal curvature appreciated.  Pt presents with a mild oropharyngeal dysphagia. Oral phase notable for trace oral residual across consistencies and prolonged mastication of solids. Pharyngeal phase notable for trace-mild pharyngeal stasis (vallecular, pyriform sinus, and posterior pharyngeal wall), swallow initiation at the vallecula and/or pyriform sinus, transient during the swallow penetration of nectar-thick liquids via cup, and SILENT before the swallow aspiration of thin liquids via cup. Above mentioned deficits secondary to: xerostomia, reduced base of tongue retraction, reduced hyolaryngeal elevation, reduced/mistimed laryngeal vesitbule closure, reduced pharyngeal stripping wave, and reduced laryngeal sensation. Recommend a mech soft diet with nectar-thick liquids with safe swallowing strategies/aspiration precautions as outlined below. Education:  Pt educated at length re: diet recommendations, safe swallowing strategies/aspiration precautions, results of assessment, rationale for thickened liquids, risks of aspiration/aspiration PNA, relationship between breathing/swallowing, and SLP POC. Pt shown videofluoroscopic images on TIMS after study was completed.  Pt asked pertinent questions and verbalized satisfaction with answers provided by SLP. Sign posted in pt's room with diet recommendations and safe swallowing strategies/aspiration precautions. RN made aware of results, recommendations and SLP POC. SLP POC: SLP to f/u per POC for diet tolerance and follow up education. Recommend post-acute SLP services for dysphagia tx.     11/08/2022   9:32 AM Treatment Recommendations Treatment Recommendations Therapy as outlined in treatment plan below     11/08/2022   9:33 AM Prognosis Prognosis for Safe Diet Advancement Fair Barriers to Reach Goals Cognitive deficits;Time post onset;Severity of deficits   11/08/2022   9:32 AM Diet Recommendations SLP Diet Recommendations Dysphagia 3 (Mech soft) solids;Nectar thick liquid Medication Administration -- Compensations Minimize environmental distractions;Slow rate;Small sips/bites Postural Changes Remain semi-upright after after feeds/meals (Comment);Seated upright at 90 degrees     11/08/2022   9:32 AM Other Recommendations Oral Care Recommendations Oral care QID Other Recommendations Order thickener from pharmacy;Prohibited food (jello, ice cream, thin soups);Remove water pitcher;Have oral suction available Follow Up Recommendations Skilled nursing-short term rehab (<3 hours/day) Functional Status Assessment Patient has had a recent decline in their functional status and/or demonstrates limited ability to make significant improvements in function in a reasonable and predictable amount of time   11/08/2022   9:32 AM Frequency and Duration  Speech Therapy Frequency (ACUTE ONLY) min 2x/week     11/08/2022   9:39 AM Oral Phase Oral - Nectar Teaspoon Lingual/palatal residue;Premature spillage Oral - Nectar Cup Premature spillage;Lingual/palatal residue Oral - Nectar Straw NT Oral - Thin Teaspoon Lingual/palatal residue;Premature spillage Oral - Thin Cup Lingual/palatal residue;Premature spillage Oral - Thin Straw NT Oral - Puree WFL Oral -  Mech Soft NT Oral - Regular Impaired mastication    11/08/2022   9:40 AM Pharyngeal Phase Pharyngeal- Nectar Teaspoon Delayed swallow initiation-pyriform sinuses;Pharyngeal residue - pyriform;Pharyngeal residue - valleculae;Reduced tongue base retraction;Reduced laryngeal elevation Pharyngeal Material does not enter airway Pharyngeal- Nectar Cup Delayed swallow initiation-pyriform sinuses;Reduced airway/laryngeal closure;Penetration/Aspiration during swallow;Pharyngeal residue - valleculae;Pharyngeal residue - pyriform;Reduced laryngeal elevation;Reduced tongue base retraction Pharyngeal Material enters airway, remains ABOVE vocal cords then ejected out Pharyngeal- Nectar Straw NT Pharyngeal- Puree Delayed swallow initiation-vallecula Pharyngeal Material does not enter airway Pharyngeal- Mechanical Soft NT Pharyngeal- Regular Delayed swallow initiation-vallecula Pharyngeal- Multi-consistency NT    11/08/2022   9:37 AM Cervical Esophageal Phase  Cervical Esophageal Phase [facility] Shawn Jennings, M.S., [facility] [phone]) Quintella Baton 11/08/2022, 9:49 AM                     Medications: I have reviewed the patient's current medications. Scheduled Meds:  atorvastatin  80 mg Oral QHS   cholecalciferol  1,000 Units Oral Daily   cyanocobalamin  1,000 mcg Oral Daily   DULoxetine  30 mg Oral BID   ferrous sulfate  325 mg Oral QODAY   lamoTRIgine  25 mg Oral BID   loratadine  10 mg Oral Daily   melatonin  5 mg Oral QHS   montelukast  10 mg Oral QHS   nicotine  7 mg Transdermal Daily   pantoprazole  40 mg Oral Daily   sodium bicarbonate  650 mg Oral TID   sodium chloride flush  3 mL Intravenous Q12H   Continuous Infusions: PRN Meds:.acetaminophen, albuterol, alum & mag hydroxide-simeth, hydrOXYzine, nitroGLYCERIN, oxyCODONE   Assessment: Principal Problem:   Anemia Active Problems:   Stage 3a chronic kidney disease  (CKD) (HCC)   Essential hypertension   Coronary artery disease of native artery of native heart with stable angina pectoris (HCC)   Ischemic cardiomyopathy   PAF (paroxysmal atrial fibrillation) (HCC)   On anticoagulant therapy   Depression   GERD (gastroesophageal reflux disease)   Hyperlipidemia   Abdominal pain   Acute blood loss anemia   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 y/o male with history of peripheral arterial disease left above-knee amputation present to the ER with epigastric pain found to have a 6 g drop in hemoglobin from baseline of 12.7 g 3 months back.  Iron low.  CT angiogram show no active bleeding but incidental findings of cyst in the kidney and significant constipation noted.  Stenosis of various vessels were also noted.EGD showed non bleeding gastric ulcer. No overt blood loss, he has been having difficulty doing bowel prep    Plan 1.  Iron deficiency anemia colonoscopy tomorrow , start bowel prep now-miralax+gatorade -  discussed with Dr Dwyane Dee If negative will require capsule study of the small bowel 2. If hb continues to drop consider tagged RBC scan  3.  IV iron 4.  Monitor CBC and transfuse as needed   I have discussed alternative options, risks & benefits,  which include, but are not limited to, bleeding, infection, perforation,respiratory complication & drug reaction.  The patient agrees with this plan & written consent will be obtained.       LOS: 8 days   Shawn Bellows, MD 11/09/2022, 10:05 AM

## 2022-11-09 NOTE — TOC Progression Note (Signed)
Transition of Care [facility]) - Progression Note    Patient Details  Name: Shawn Jennings MRN: [id number] Date of Birth: Aug 16, 1946  [facility] of [facility]) CM/SW Contact  Gerilyn Pilgrim, LCSW Phone Number: 11/09/2022, 1:04 PM  Clinical Narrative:   Pt now getting colonoscopy on 1/20. [facility] unable to accept back over the weekend but could accept on Monday.     Expected Discharge Plan: Assisted Living Barriers to Discharge: Continued Medical Work up  Expected Discharge Plan and Services                                               Social Determinants of Health (SDOH) Interventions SDOH Screenings   Food Insecurity: No Food Insecurity (11/01/2022)  Housing: Low Risk  (11/01/2022)  Transportation Needs: No Transportation Needs (11/01/2022)  Utilities: Not At Risk (11/01/2022)  Depression (PHQ2-9): Low Risk  (06/11/2022)  Tobacco Use: High Risk (11/07/2022)    Readmission Risk Interventions    11/08/2022    1:04 PM  Readmission Risk Prevention Plan  Transportation Screening Complete  Medication Review ([place]) Complete  PCP or Specialist appointment within 3-5 days of discharge Complete  HRI or Home Care Consult Complete  [place] Complete

## 2022-11-10 ENCOUNTER — Encounter
Admission: EM | Disposition: A | Discharge status: Nursing Home (Includes ICF) | Payer: Self-pay | Source: Skilled Nursing Facility | Attending: Family Medicine

## 2022-11-10 DIAGNOSIS — D649 Anemia, unspecified: Secondary | ICD-10-CM | POA: Diagnosis not present

## 2022-11-10 LAB — BASIC METABOLIC PANEL
Anion gap: 6 (ref 5–15)
BUN: 16 mg/dL (ref 8–23)
CO2: 24 mmol/L (ref 22–32)
Calcium: 8.1 mg/dL — ABNORMAL LOW (ref 8.9–10.3)
Chloride: 108 mmol/L (ref 98–111)
Creatinine, Ser: 1.11 mg/dL (ref 0.61–1.24)
GFR, Estimated: >60 mL/min (ref >60)
Glucose, Bld: 93 mg/dL (ref 70–99)
Potassium: 4.5 mmol/L (ref 3.5–5.1)
Sodium: 138 mmol/L (ref 135–145)

## 2022-11-10 LAB — CBC
HCT: 26.4 % — ABNORMAL LOW (ref 39.0–52.0)
Hemoglobin: 7.9 g/dL — ABNORMAL LOW (ref 13.0–17.0)
MCH: 25.5 pg — ABNORMAL LOW (ref 26.0–34.0)
MCHC: 29.9 g/dL — ABNORMAL LOW (ref 30.0–36.0)
MCV: 85.2 fL (ref 80.0–100.0)
Platelets: 344 10*3/uL (ref 150–400)
RBC: 3.1 MIL/uL — ABNORMAL LOW (ref 4.22–5.81)
RDW: 16 % — ABNORMAL HIGH (ref 11.5–15.5)
WBC: 10.2 10*3/uL (ref 4.0–10.5)
nRBC: 0 % (ref 0.0–0.2)

## 2022-11-10 SURGERY — COLONOSCOPY WITH PROPOFOL
Anesthesia: General

## 2022-11-10 NOTE — Progress Notes (Signed)
GI note  Patient refused to drink colon prep - refused NG tube. Cannot do colonoscopy .   Dr [name] MD,MRCP [facility]) Gastroenterology/Hepatology Pager: [phone]

## 2022-11-10 NOTE — Progress Notes (Signed)
PROGRESS NOTE    Shawn Jennings  WGY:[id number] DOB: [birth year] DOA: 10/31/2022 PCP: Pcp, No   Brief Narrative:  This 77 yrs old male with PMH significant of Parox Afib on anticoagulation w/ Eliquis, DM2, CKD3a, CAD, Ischemic cardiomyopathy, HFrEF, GERD, Hx. 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  Patient presented in the ED with mild epigastric abdominal pain associated with anemia, hemoglobin 6.3 in ED . Patient has received 3 units of PRBC so far.  CTA negative for any active GI bleeding.  Patient is admitted for further evaluation.  GI is consulted.  Patient needed to be off of Eliquis for 2 days.  Patient underwent EGD on 11/06/2021 showed nonbleeding gastric ulcer, did not have preparation for colonoscopy.  SNF is pending,  TOC consulted to reach out to DSS regarding guardianship given patient's limited capacity.  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  He has received 4 units of PRBC so far.  Hemoglobin dropped to 6.9 on 11/08/22 .  Transfused 1 PRBC, Hb 8.2 Hold anticoagulation in the setting of GI bleed. Monitor H/ H q12hr, Transfuse PRBC if Hb below 7.0 GI consulted.  Patient underwent EGD 11/06/2022. EGD shows nonbleeding gastric ulcer, Patient refused preparation for colonoscopy 11/07/2022. Patient agreed for colonoscopy, GI. Notified.  Patient has not completed preparation for colonoscopy. Colonoscopy rescheduled twice, Patient has not completed prep for colonoscopy , He refused NG tube. Discussed with GI and he recommended RBC tagged scan. Hb 7.9  Chronic heart failure with reduced EF: Does not appear in exacerbation. Blood pressure remains soft. Holding home medications, Entresto, Lasix, spironolactone, Imdur, beta-blocker. Resume when blood pressure improves.  Paroxysmal atrial fibrillation: Heart rate is now well-controlled. Eliquis is on hold in the setting of GI bleed.   Resume Eliquis once cleared by GI.  Essential hypertension: Blood pressure is soft, hold blood pressure medications.  Hyperlipidemia Continue Lipitor 80 mg daily.  Coronary artery disease: Ischemic cardiomyopathy: Continue Lipitor, Hold Imdur, Entresto due to low BP. He denies any chest pain, palpitations.  CKD stage IIIa: Metabolic acidosis : Continue sodium bicarbonate  Monitor serum creatinine, avoid nephrotoxic medications.  Cognitive impairment: Memory deficit: Patient does have some memory deficits, cognitive impairment. Have consulted TOC regarding getting him set up with DSS guardian.    Depression: Continue Cymbalta and lamotrigine.  Left AKA: Stable.  PT and OT when able.  COPD.: Stable, continue home medications.  Not in exacerbation.  Syncope: He was hemodynamically stable.  CT head negative, ammonia level 17, ABG pCO2 34. Patient woke up after having CT  head.  EKG normal sinus rhythm. Workup negative.    DVT prophylaxis: None at this time. Code Status: Full code Family Communication: No family at  bedside Disposition Plan:   Status is: Inpatient Remains inpatient appropriate because: GI evaluation for bleeding.  EGD shows nonbleeding gastric ulcers, colonoscopy is pending.   Consultants:  Gastroenterology  Procedures: 11/06/2022 showed nonbleeding gastric ulcer  Antimicrobials: Anti-infectives (From admission, onward)    Start     Dose/Rate Route Frequency Ordered Stop   11/01/22 2300  azithromycin (ZITHROMAX) 500 mg in sodium chloride 0.9 % 250 mL IVPB  Status:  Discontinued        500 mg 250 mL/hr over 60 Minutes Intravenous Every 24 hours 11/01/22 0107 11/01/22 0748   11/01/22 2200  cefTRIAXone (ROCEPHIN) 1 g in sodium chloride 0.9 % 100 mL IVPB  Status:  Discontinued        1 g 200 mL/hr over 30 Minutes Intravenous Every 24 hours 11/01/22 0107 11/01/22 0748   11/01/22 0100  cefTRIAXone (ROCEPHIN) 1 g in sodium chloride 0.9 % 100 mL IVPB  Status:  Discontinued        1 g 200 mL/hr over 30 Minutes Intravenous Every 24 hours 11/01/22 0056 11/01/22 0107   11/01/22 0100  azithromycin (ZITHROMAX) 500 mg in sodium chloride 0.9 % 250 mL IVPB  Status:  Discontinued        500 mg 250 mL/hr over 60 Minutes Intravenous Every 24 hours 11/01/22 0056 11/01/22 0107   10/31/22 2115  cefTRIAXone (ROCEPHIN) 1 g in sodium chloride 0.9 % 100 mL IVPB        1 g 200 mL/hr over 30 Minutes Intravenous  Once 10/31/22 2111 10/31/22 2333   10/31/22 2115  azithromycin (ZITHROMAX) 500 mg in sodium chloride 0.9 % 250 mL IVPB        500 mg 250 mL/hr over 60 Minutes Intravenous  Once 10/31/22 2111 11/01/22 0055      Subjective: Patient was seen and examined at bedside.Overnight events noted. Patient reports he has completed prep liquid for colonoscopy but been refusing to drink and NG tube. GI has cancelled colonoscopy given no prep and patient refused NG tube.  Objective: Vitals:   11/09/22 2032 11/10/22 0059 11/10/22 0506 11/10/22 0903  BP: 121/69 102/65 103/61 111/64  Pulse: 73 68 74 67   Resp: '16 16 16 16  '$ Temp: 98.4 F (36.9 C) 98 F (36.7 C) 98.1 F (36.7 C) 97.6 F (36.4 C)  TempSrc:    Oral  SpO2: 99% 95% 95% 97%  Weight:      Height:        Intake/Output Summary (Last 24 hours) at 11/10/2022 1050 Last data filed at 11/10/2022 0647 Gross per 24 hour  Intake --  Output 500 ml  Net -500 ml   Filed Weights   10/31/22 1720  Weight: 73 kg    Examination:  General exam: Appears comfortable, not in any acute distress.  Deconditioned. Respiratory system: CTA bilaterally, respiratory effort normal, no accessory muscle use, RR 15 Cardiovascular system: S1 & S2 heard, regular rate and rhythm, no murmur. Gastrointestinal system: Abdomen is soft, non tender, non distended, BS+ Central nervous system: Alert and oriented x 3 . No focal neurological deficits. Extremities: Left AKA, no edema, no cyanosis, no clubbing. Skin: No rashes, lesions or ulcers Psychiatry: Judgement and insight appear normal. Mood & affect appropriate.     Data Reviewed: I have personally reviewed following labs and imaging studies  CBC: Recent  Labs  Lab 11/06/22 0402 11/07/22 0528 11/07/22 0820 11/08/22 0314 11/09/22 0840 11/10/22 0530  WBC 10.5 9.8 8.9 11.0*  --  10.2  NEUTROABS  --   --  6.1  --   --   --   HGB 8.2* 7.4* 7.7* 6.9* 8.2* 7.9*  HCT 27.4* 24.8* 26.1* 23.6* 26.8* 26.4*  MCV 86.4 86.1 85.9 86.4  --  85.2  PLT 315 301 298 332  --  700   Basic Metabolic Panel: Recent Labs  Lab 11/06/22 0402 11/07/22 0528 11/07/22 0820 11/08/22 0314 11/10/22 0530  NA 139 138 138 138 138  K 4.4 4.2 4.1 4.3 4.5  CL 111 111 109 109 108  CO2 23 20* 21* 20* 24  GLUCOSE 119* 126* 129* 157* 93  BUN 24* 24* 24* 29* 16  CREATININE 1.24 1.19 1.30* 1.30* 1.11  CALCIUM 8.2* 8.0* 8.0* 7.9* 8.1*  MG  --  2.2  --  2.3  --   PHOS  --   --   --  4.3  --    GFR: Estimated Creatinine Clearance: 56.6 mL/min (by C-G formula based on SCr of 1.11 mg/dL). Liver Function Tests: Recent Labs   Lab 11/06/22 0402  AST 11*  ALT 10  ALKPHOS 61  BILITOT 0.7  PROT 5.4*  ALBUMIN 2.3*   No results for input(s): "LIPASE", "AMYLASE" in the last 168 hours.  Recent Labs  Lab 11/07/22 0820  AMMONIA 17   Coagulation Profile: No results for input(s): "INR", "PROTIME" in the last 168 hours.  Cardiac Enzymes: No results for input(s): "CKTOTAL", "CKMB", "CKMBINDEX", "TROPONINI" in the last 168 hours. BNP (last 3 results) No results for input(s): "PROBNP" in the last 8760 hours. HbA1C: No results for input(s): "HGBA1C" in the last 72 hours. CBG: Recent Labs  Lab 11/07/22 0758 11/07/22 0846  GLUCAP 129* 230*   Lipid Profile: No results for input(s): "CHOL", "HDL", "LDLCALC", "TRIG", "CHOLHDL", "LDLDIRECT" in the last 72 hours. Thyroid Function Tests: Recent Labs    11/07/22 1210  TSH 1.530  T4TOTAL 6.9   Anemia Panel: No results for input(s): "VITAMINB12", "FOLATE", "FERRITIN", "TIBC", "IRON", "RETICCTPCT" in the last 72 hours. Sepsis Labs: Recent Labs  Lab 11/07/22 0820  LATICACIDVEN 1.3    No results found for this or any previous visit (from the past 240 hour(s)).  Radiology Studies: No results found.  Scheduled Meds:  atorvastatin  80 mg Oral QHS   cholecalciferol  1,000 Units Oral Daily   cyanocobalamin  1,000 mcg Oral Daily   DULoxetine  30 mg Oral BID   ferrous sulfate  325 mg Oral QODAY   lamoTRIgine  25 mg Oral BID   loratadine  10 mg Oral Daily   melatonin  5 mg Oral QHS   montelukast  10 mg Oral QHS   nicotine  7 mg Transdermal Daily   pantoprazole  40 mg Oral Daily   polyethylene glycol powder  1 Container Oral Once   sodium bicarbonate  650 mg Oral TID   sodium chloride flush  3 mL Intravenous Q12H   Continuous Infusions:  sodium chloride 20 mL/hr at 11/09/22 1325     LOS: 9 days    Time spent: 35 mins    Dorette Hartel, MD [facility]   If 7PM-7AM, please contact night-coverage Ankle prep liquid for colonoscopy.GI  states

## 2022-11-11 ENCOUNTER — Inpatient Hospital Stay: Payer: Medicare Other

## 2022-11-11 DIAGNOSIS — D649 Anemia, unspecified: Secondary | ICD-10-CM | POA: Diagnosis not present

## 2022-11-11 LAB — HEMOGLOBIN AND HEMATOCRIT, BLOOD
HCT: 25 % — ABNORMAL LOW (ref 39.0–52.0)
Hemoglobin: 7.5 g/dL — ABNORMAL LOW (ref 13.0–17.0)

## 2022-11-11 MED ORDER — TECHNETIUM TC 99M-LABELED RED BLOOD CELLS IV KIT
20.0000 | PACK | Freq: Once | INTRAVENOUS | Status: AC | PRN
  Administered 2022-11-11: 23.47 via INTRAVENOUS

## 2022-11-11 NOTE — Progress Notes (Signed)
Speech Language Pathology Treatment:    Patient Details Name: Shawn Jennings MRN: [id number] DOB: 1946-04-18 Today's Date: 11/11/2022 Time: 1017-5102 SLP Time Calculation (min) (ACUTE ONLY): 12 min  Assessment / Plan / Recommendation Clinical Impression  Pt seen for diet tolerance and f/u education. Pt alert, pleasant, and cooperative. Pt with subjective improvement in coughing with meals since initiating nectar-thick liquids. RN at bedside midway through session. Pt left in care of RN.   Pt had MBSS completed 11/08/22. Pt initially restricted to a nectar-thick liquid diet by GI. Pt has seen been upgraded back to mech soft diet with nectar-thick liquids.   Pt observed with items from meal tray as well as meds whole with pureed as administered by RN. Pt demonstrated adequate mastication and oral clearance of soft solids. Pharyngeal swallow appeared [facility] with soft solids, pureed, and nectar-thick liquids. No overt s/sx pharyngeal dysphagia. No change to vocal quality or changes to respiratory status.   Reviewed results of MBSS, diet recommendations, safe swallowing strategies/aspiration precautions, relationship between breathing/swallowing, risks/sequelae of aspiration/aspiration PNA, and SLP POC with pt. Pt verbalized understanding/agreement; however, suspect need for reinforcement given documented cognitive deficits.   Recommend continuation of a mech soft diet with nectar-thick liquids and safe swallowing strategies/aspiration precautions as outlined below.   SLP to sign off as pt has no acute SLP needs.   RN made aware of results, recommendations, and SLP POC.    HPI HPI: Pt is a 77 y.o. male  w/ past medical history of CAD, heart failure, CKD, CABG, COPD, oxygen supplementation at night 2L, etoh, depression, hypertension, hyperlipidemia, peripheral artery disease, paroxysmal atrial fibrillation on Eliquis, diabetes, left leg amputation presents with epigastric pain for the past 4 days.   Denies nausea or vomiting, denies GI bleeding.  Some exertional shortness of breath and generalized weakness.     Of note he has had upper GI bleeding back in 2022 which showed gastropathy.   CXR: No active cardiopulmonary disease. Streaky atelectasis or scarring  at the bases.  He resides at a [facility].   OF NOTE: receives Palliative Care services baseline.  MD/GI suggested pt may have Cognitive decline/Dementia.      SLP Plan  All goals met;Discharge SLP treatment due to (comment) (all goals met)      Recommendations for follow up therapy are one component of a multi-disciplinary discharge planning process, led by the attending physician.  Recommendations may be updated based on patient status, additional functional criteria and insurance authorization.    Recommendations  Diet recommendations: Dysphagia 3 (mechanical soft);Nectar-thick liquid Medication Administration: Whole meds with puree Supervision: Patient able to self feed;Intermittent supervision to cue for compensatory strategies Compensations: Minimize environmental distractions;Slow rate;Small sips/bites Postural Changes and/or Swallow Maneuvers: Out of bed for meals;Seated upright 90 degrees;Upright 30-60 min after meal                Oral Care Recommendations: Oral care BID;Oral care before and after PO;Staff/trained caregiver to provide oral care (denture care) Follow Up Recommendations: Skilled nursing-short term rehab (<3 hours/day) Assistance recommended at discharge: Intermittent Supervision/Assistance SLP Visit Diagnosis: Dysphagia, oropharyngeal phase (R13.12) Plan: All goals met;Discharge SLP treatment due to (comment) (all goals met)          Shawn Jennings, M.S., Shawn Jennings [phone] Shawn Jennings)  Shawn Jennings  11/11/2022, 10:11 AM

## 2022-11-11 NOTE — Progress Notes (Addendum)
PT Cancellation Note  Patient Details Name: Shawn Jennings MRN: [id number] DOB: [birth year]   Cancelled Treatment:    Reason Eval/Treat Not Completed: Patient at procedure or test/unavailable Attempted to see pt for PT tx. Pt noted to be off the floor at this time. Will f/u as Shawn.  Attempted to see pt again at 12:07pm but pt still out of room. Will re-attempt as Shawn.  1:10PM: Pt back in room, PT attempted to see pt for PT tx. Pt reports he's almost finished with lunch & PT offered to assist pt to recliner or sit EOB to finish eating. Pt refuses getting OOB to sit in recliner multiple times despite encouragement/education. Will f/u as Shawn.   Lavone Nian, PT, DPT 11/11/22, 1:14 PM   [name] 11/11/2022, 10:25 AM

## 2022-11-11 NOTE — Progress Notes (Signed)
PROGRESS NOTE    Shawn Jennings  MOQ:[id number] DOB: 09/25/1946 DOA: 10/31/2022 PCP: Pcp, No   Brief Narrative:  This 77 yrs old male with PMH significant of Parox Afib on anticoagulation w/ Eliquis, DM2, CKD3a, CAD, Ischemic cardiomyopathy, HFrEF, GERD, Hx. 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  Patient presented in the ED with mild epigastric abdominal pain associated with anemia, hemoglobin 6.3 in ED . Patient has received 3 units of PRBC so far.  CTA negative for any active GI bleeding.  Patient is admitted for further evaluation.  GI is consulted.  Patient needed to be off of Eliquis for 2 days.  Patient underwent EGD on 11/06/2021 showed nonbleeding gastric ulcer, did not have preparation for colonoscopy.  SNF is pending,  TOC consulted to reach out to DSS regarding guardianship given patient's limited capacity.  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  He has received 4 units of PRBC so far.  Hemoglobin dropped to 6.9 on 11/08/22 .  Transfused 1 PRBC, Hb 8.2 Hold anticoagulation in the setting of GI bleed. Monitor H/ H q12hr, Transfuse PRBC if Hb below 7.0 GI consulted.  Patient underwent EGD 11/06/2022. EGD shows nonbleeding gastric ulcer, Patient refused preparation for colonoscopy 11/07/2022. Patient agreed for colonoscopy, GI. Notified.  Patient has not completed preparation for colonoscopy. Colonoscopy rescheduled twice, Patient has not completed prep for colonoscopy , He also refused NG tube for prep fluid. Discussed with GI and he recommended RBC tagged scan. Hb 7.9 RBC tagged scan completed, report pending.  Chronic heart failure with reduced EF: Does not appear in exacerbation. Blood pressure remains soft. Holding home medications, Entresto, Lasix, spironolactone, Imdur, beta-blocker. Resume when blood pressure improves.  Paroxysmal atrial fibrillation: Heart rate is now well-controlled. Eliquis is on hold in the setting of GI bleed.   Resume Eliquis once cleared by GI.  Essential hypertension: Blood pressure is soft, hold blood pressure medications.  Hyperlipidemia Continue Lipitor 80 mg daily.  Coronary artery disease: Ischemic cardiomyopathy: Continue Lipitor, Hold Imdur, Entresto due to low BP. He denies any chest pain, palpitations.  CKD stage IIIa: Metabolic acidosis : > Resolved Discontinue sodium bicarbonate. Monitor serum creatinine, avoid nephrotoxic medications.  Cognitive impairment: Memory deficit: Patient does have some memory deficits, cognitive impairment. Have consulted TOC regarding getting him set up with DSS guardian.    Depression: Continue Cymbalta and lamotrigine.  Left AKA: Stable.  PT and OT when able.  COPD.: Stable, continue home medications.  Not in exacerbation.  Syncope: He was hemodynamically stable.  CT head negative, ammonia  level 17, ABG pCO2 34. Patient woke up after having CT head.  EKG normal sinus rhythm. Workup negative.    DVT prophylaxis:  None at this time. Code Status: Full code Family Communication: No family at bedside Disposition Plan:   Status is: Inpatient Remains inpatient appropriate because: GI evaluation for bleeding.  EGD shows nonbleeding gastric ulcers, colonoscopy is pending.   Consultants:  Gastroenterology  Procedures: 11/06/2022 showed nonbleeding gastric ulcer  Antimicrobials: Anti-infectives (From admission, onward)    Start     Dose/Rate Route Frequency Ordered Stop   11/01/22 2300  azithromycin (ZITHROMAX) 500 mg in sodium chloride 0.9 % 250 mL IVPB  Status:  Discontinued        500 mg 250 mL/hr over 60 Minutes Intravenous Every 24 hours 11/01/22 0107 11/01/22 0748   11/01/22 2200  cefTRIAXone (ROCEPHIN) 1 g in sodium chloride 0.9 % 100 mL IVPB  Status:  Discontinued        1 g 200 mL/hr over 30 Minutes Intravenous Every 24 hours 11/01/22 0107 11/01/22 0748   11/01/22 0100  cefTRIAXone (ROCEPHIN) 1 g in sodium chloride 0.9 % 100 mL IVPB  Status:  Discontinued        1 g 200 mL/hr over 30 Minutes Intravenous Every 24 hours 11/01/22 0056 11/01/22 0107   11/01/22 0100  azithromycin (ZITHROMAX) 500 mg in sodium chloride 0.9 % 250 mL IVPB  Status:  Discontinued        500 mg 250 mL/hr over 60 Minutes Intravenous Every 24 hours 11/01/22 0056 11/01/22 0107   10/31/22 2115  cefTRIAXone (ROCEPHIN) 1 g in sodium chloride 0.9 % 100 mL IVPB        1 g 200 mL/hr over 30 Minutes Intravenous  Once 10/31/22 2111 10/31/22 2333   10/31/22 2115  azithromycin (ZITHROMAX) 500 mg in sodium chloride 0.9 % 250 mL IVPB        500 mg 250 mL/hr over 60 Minutes Intravenous  Once 10/31/22 2111 11/01/22 0055      Subjective: Patient was seen and examined at bedside.Overnight events noted. Patient still refused  drinking GoLytely and refusing NG tube. GI has cancelled colonoscopy given no prep . Rbc tagged scan ordered.  Objective: Vitals:   11/10/22 2028 11/10/22 2142 11/11/22 0514 11/11/22 0935  BP: 122/76 114/69  112/74 (!) 94/54  Pulse: 79 87 76 75  Resp: '18 18 16 17  '$ Temp: 98.3 F (36.8 C) 97.8 F (36.6 C) 98.4 F (36.9 C) 97.8 F (36.6 C)  TempSrc:      SpO2: 96% 97% 97% 97%  Weight:      Height:        Intake/Output Summary (Last 24 hours) at 11/11/2022 1059 Last data filed at 11/11/2022 0300 Gross per 24 hour  Intake 220 ml  Output 350 ml  Net -130 ml   Filed Weights   10/31/22 1720  Weight: 73 kg    Examination:  General exam: Appears comfortable, not in any acute distress.  Deconditioned. Respiratory system: CTA bilaterally, respiratory effort normal, RR 16. Cardiovascular system: S1 & S2 heard, regular rate and rhythm, no murmur. Gastrointestinal system: Abdomen is soft, non tender, non distended, BS+ Central nervous system: Alert and oriented x 3 . No focal neurological deficits. Extremities: Left AKA, no edema, no cyanosis, no clubbing. Skin: No rashes, lesions or ulcers Psychiatry: Judgement and insight appear normal. Mood & affect appropriate.     Data Reviewed: I have personally reviewed following labs and imaging studies  CBC: Recent Labs  Lab 11/06/22 0402 11/07/22 0528 11/07/22 0820 11/08/22 0314 11/09/22 0840 11/10/22 0530 11/11/22 0334  WBC 10.5 9.8 8.9 11.0*  --  10.2  --   NEUTROABS  --   --  6.1  --   --   --   --   HGB 8.2* 7.4* 7.7* 6.9* 8.2* 7.9* 7.5*  HCT 27.4* 24.8* 26.1* 23.6* 26.8* 26.4* 25.0*  MCV 86.4 86.1 85.9 86.4  --  85.2  --   PLT 315 301 298 332  --  344  --    Basic Metabolic Panel: Recent Labs  Lab 11/06/22 0402 11/07/22 0528 11/07/22 0820 11/08/22 0314 11/10/22 0530  NA 139 138 138 138 138  K 4.4 4.2 4.1 4.3 4.5  CL 111 111 109 109 108  CO2 23 20* 21* 20* 24  GLUCOSE 119* 126* 129* 157* 93  BUN 24* 24* 24* 29* 16  CREATININE 1.24 1.19 1.30* 1.30* 1.11  CALCIUM 8.2* 8.0* 8.0* 7.9* 8.1*  MG  --  2.2  --  2.3  --   PHOS  --   --   --  4.3  --    GFR: Estimated Creatinine Clearance: 56.6 mL/min (by C-G formula based  on SCr of 1.11 mg/dL). Liver Function Tests: Recent Labs  Lab 11/06/22 0402  AST 11*  ALT 10  ALKPHOS 61  BILITOT 0.7  PROT 5.4*  ALBUMIN 2.3*   No results for input(s): "LIPASE", "AMYLASE" in the last 168 hours.  Recent Labs  Lab 11/07/22 0820  AMMONIA 17   Coagulation Profile: No results for input(s): "INR", "PROTIME" in the last 168 hours.  Cardiac Enzymes: No results for input(s): "CKTOTAL", "CKMB", "CKMBINDEX", "TROPONINI" in the last 168 hours. BNP (last 3 results) No results for input(s): "PROBNP" in the last 8760 hours. HbA1C: No results for input(s): "HGBA1C" in the last 72 hours. CBG: Recent Labs  Lab 11/07/22 0758 11/07/22 0846  GLUCAP 129* 230*   Lipid Profile: No results for input(s): "CHOL", "HDL", "LDLCALC", "TRIG", "CHOLHDL", "LDLDIRECT" in the last 72 hours. Thyroid Function Tests: No results for input(s): "TSH", "T4TOTAL", "FREET4", "T3FREE", "THYROIDAB" in the last 72 hours.  Anemia Panel: No results for input(s): "VITAMINB12", "FOLATE", "FERRITIN", "TIBC", "IRON", "RETICCTPCT" in the last 72 hours. Sepsis Labs: Recent Labs  Lab 11/07/22 0820  LATICACIDVEN 1.3    No results found for this or any previous visit (from the past 240 hour(s)).  Radiology Studies: No results found.  Scheduled Meds:  atorvastatin  80 mg Oral QHS   cholecalciferol  1,000 Units Oral Daily   cyanocobalamin  1,000 mcg Oral Daily   DULoxetine  30 mg Oral BID   ferrous sulfate  325 mg Oral QODAY   lamoTRIgine  25 mg Oral BID   loratadine  10 mg Oral Daily   melatonin  5 mg Oral QHS   montelukast  10 mg Oral QHS   nicotine  7 mg Transdermal Daily   pantoprazole  40 mg Oral Daily   polyethylene glycol powder  1 Container Oral Once   sodium chloride flush  3 mL Intravenous Q12H   Continuous Infusions:  sodium chloride 20 mL/hr at 11/09/22 1325     LOS: 10 days   Time spent: 35 mins  Keltin Baird, MD [facility]   If 7PM-7AM, please contact  night-coverage Ankle prep liquid for colonoscopy.GI states

## 2022-11-12 DIAGNOSIS — D509 Iron deficiency anemia, unspecified: Secondary | ICD-10-CM

## 2022-11-12 DIAGNOSIS — D500 Iron deficiency anemia secondary to blood loss (chronic): Secondary | ICD-10-CM | POA: Diagnosis not present

## 2022-11-12 DIAGNOSIS — R1013 Epigastric pain: Secondary | ICD-10-CM | POA: Diagnosis not present

## 2022-11-12 DIAGNOSIS — D649 Anemia, unspecified: Secondary | ICD-10-CM | POA: Diagnosis not present

## 2022-11-12 LAB — HEMOGLOBIN AND HEMATOCRIT, BLOOD
HCT: 26.8 % — ABNORMAL LOW (ref 39.0–52.0)
Hemoglobin: 8 g/dL — ABNORMAL LOW (ref 13.0–17.0)

## 2022-11-12 MED ORDER — POLYETHYLENE GLYCOL 3350 17 GM/SCOOP PO POWD
1.0000 | Freq: Once | ORAL | Status: AC
  Administered 2022-11-12: 255 g via ORAL
  Filled 2022-11-12: qty 255

## 2022-11-12 MED ORDER — SODIUM CHLORIDE 0.9 % IV SOLN: INTRAVENOUS | Status: DC

## 2022-11-12 MED ORDER — SODIUM CHLORIDE 0.9 % IV SOLN
125.0000 mg | Freq: Once | INTRAVENOUS | Status: AC
  Administered 2022-11-12: 125 mg via INTRAVENOUS
  Filled 2022-11-12: qty 10

## 2022-11-12 NOTE — Progress Notes (Signed)
Shawn Darby, MD [address]  [place]  [place],  [address]  Main: [phone]  Fax: [phone] Pager: [phone]   Subjective: No acute events overnight.  Patient denies any episodes of rectal bleeding, abdominal pain, nausea or vomiting.   Objective: Vital signs in last 24 hours: Vitals:   11/12/22 0519 11/12/22 0910 11/12/22 1623 11/12/22 2100  BP: 108/62 107/64 120/74 110/65  Pulse: 72 76 75 79  Resp: '19 20 20   '$ Temp: 98.6 F (37 C) 98.1 F (36.7 C) 98 F (36.7 C) 98 F (36.7 C)  TempSrc:    Oral  SpO2: 100% 94% 98% 97%  Weight:      Height:       Weight change:   Intake/Output Summary (Last 24 hours) at 11/12/2022 2317 Last data filed at 11/12/2022 1623 Gross per 24 hour  Intake --  Output 1500 ml  Net -1500 ml     Exam: Heart:: Regular rate and rhythm Lungs: normal Abdomen: soft, nontender, normal bowel sounds   Lab Results: '@LABTEST2'$ @ Micro Results: No results found for this or any previous visit (from the past 240 hour(s)). Studies/Results: NM GI Blood Loss  Result Date: 11/11/2022 CLINICAL DATA:  GI bleeding. EXAM: NUCLEAR MEDICINE GASTROINTESTINAL BLEEDING SCAN TECHNIQUE: Sequential abdominal images were obtained following intravenous administration of Tc-93mlabeled red blood cells. RADIOPHARMACEUTICALS:  23.5 mCi Tc-956mertechnetate in-vitro labeled red cells. COMPARISON:  None Available. FINDINGS: Good blood pool activity evident. There is no evidence for accumulation over time of radiotracer in the abdomen or pelvis to suggest a site of active GI bleeding. IMPRESSION: Negative for active GI bleeding. Electronically Signed   By: ErMisty Stanley.D.   On: 11/11/2022 12:10   Medications: I have reviewed the patient's current medications. Prior to Admission:  Medications Prior to Admission  Medication Sig Dispense Refill Last Dose   albuterol (VENTOLIN HFA) 108 (90 Base) MCG/ACT inhaler Inhale 2 puffs into the lungs every 6  (six) hours as needed for wheezing or shortness of breath.    prn   apixaban (ELIQUIS) 5 MG TABS tablet Take 1 tablet (5 mg total) by mouth 2 (two) times daily. 60 tablet 0 10/31/2022 at 0748   dapagliflozin propanediol (FARXIGA) 10 MG TABS tablet Take 1 tablet (10 mg total) by mouth daily. 30 tablet 5 10/31/2022 at 0748   DULoxetine (CYMBALTA) 30 MG capsule Take 30 mg by mouth 2 (two) times daily.   10/31/2022 at 0748   furosemide (LASIX) 20 MG tablet Take 1 tablet (20 mg total) by mouth daily. 30 tablet 0 10/31/2022 at 0748   isosorbide mononitrate (IMDUR) 30 MG 24 hr tablet Take 1 tablet (30 mg total) by mouth daily. 30 tablet 0 10/31/2022 at 0748   lamoTRIgine (LAMICTAL) 25 MG tablet Take 25 mg by mouth 2 (two) times daily.   10/31/2022 at 0748   Melatonin 5 MG CAPS Take 5 mg by mouth at bedtime.   10/30/2022 at 1912   montelukast (SINGULAIR) 10 MG tablet Take 10 mg by mouth at bedtime.   10/30/2022 at 1912   Multiple Vitamins-Minerals (PRESERVISION AREDS) CAPS Take 1 capsule by mouth in the morning and at bedtime.   10/31/2022 at 0748   nitroGLYCERIN (NITROSTAT) 0.4 MG SL tablet Place 0.4 mg under the tongue every 5 (five) minutes as needed for chest pain. Give one tablet sublingually q5 minutes x 3 doses PRN for chest pain relief   10/28/2022 at 1330   pantoprazole (PROTONIX)  40 MG tablet Take 40 mg by mouth daily.   10/31/2022 at 0748   sacubitril-valsartan (ENTRESTO) 24-26 MG Take 1 tablet by mouth 2 (two) times daily.   10/31/2022 at 0748   Semaglutide, 1 MG/DOSE, (OZEMPIC, 1 MG/DOSE,) 4 MG/3ML SOPN Inject 1 mg into the skin once a week.   10/25/2022 at 1052   spironolactone (ALDACTONE) 25 MG tablet Take 1 tablet (25 mg total) by mouth daily. 90 tablet 3 10/31/2022 at 0748   ACETAMINOPHEN EXTRA STRENGTH 500 MG capsule Take 1 capsule by mouth every 6 (six) hours as needed. (Patient not taking: Reported on 11/01/2022)   Not Taking   atorvastatin (LIPITOR) 80 MG tablet Take 80 mg by mouth at bedtime. (Patient not  taking: Reported on 11/01/2022)   Not Taking   BIOFREEZE COOL THE PAIN 4 % GEL Apply 1 Application topically 3 (three) times daily as needed. Apply to right shoulder three times daily as needed for pain (Patient not taking: Reported on 11/01/2022)   Not Taking   carvedilol (COREG) 3.125 MG tablet Take 1 tablet (3.125 mg total) by mouth 2 (two) times daily. (Patient not taking: Reported on 11/01/2022) 60 tablet 3 Not Taking   D 1000 25 MCG (1000 UT) capsule Take 1 capsule by mouth daily. (Patient not taking: Reported on 11/01/2022)   Not Taking   ferrous sulfate 325 (65 FE) MG tablet Take 325 mg by mouth every other day.   10/29/2022 at 0756   hydrOXYzine (ATARAX/VISTARIL) 25 MG tablet Take 25 mg by mouth every 6 (six) hours as needed (allergy symptoms). (Patient not taking: Reported on 11/01/2022)   Not Taking   loratadine (CLARITIN) 10 MG tablet Take 10 mg by mouth daily. (Patient not taking: Reported on 11/01/2022)   Not Taking   vitamin B-12 (CYANOCOBALAMIN) 1000 MCG tablet Take 1,000 mcg by mouth daily. (Patient not taking: Reported on 11/01/2022)   Not Taking   Scheduled:  atorvastatin  80 mg Oral QHS   cholecalciferol  1,000 Units Oral Daily   cyanocobalamin  1,000 mcg Oral Daily   DULoxetine  30 mg Oral BID   ferrous sulfate  325 mg Oral QODAY   lamoTRIgine  25 mg Oral BID   loratadine  10 mg Oral Daily   melatonin  5 mg Oral QHS   montelukast  10 mg Oral QHS   nicotine  7 mg Transdermal Daily   pantoprazole  40 mg Oral Daily   sodium chloride flush  3 mL Intravenous Q12H   Continuous:  sodium chloride 20 mL/hr at 11/09/22 1325   [START ON 11/13/2022] sodium chloride     RCV:[id number], albuterol, alum & mag hydroxide-simeth, hydrOXYzine, nitroGLYCERIN, oxyCODONE Anti-infectives (From admission, onward)    Start     Dose/Rate Route Frequency Ordered Stop   11/01/22 2300  azithromycin (ZITHROMAX) 500 mg in sodium chloride 0.9 % 250 mL IVPB  Status:  Discontinued        500 mg 250  mL/hr over 60 Minutes Intravenous Every 24 hours 11/01/22 0107 11/01/22 0748   11/01/22 2200  cefTRIAXone (ROCEPHIN) 1 g in sodium chloride 0.9 % 100 mL IVPB  Status:  Discontinued        1 g 200 mL/hr over 30 Minutes Intravenous Every 24 hours 11/01/22 0107 11/01/22 0748   11/01/22 0100  cefTRIAXone (ROCEPHIN) 1 g in sodium chloride 0.9 % 100 mL IVPB  Status:  Discontinued        1 g 200 mL/hr over 30 Minutes  Intravenous Every 24 hours 11/01/22 0056 11/01/22 0107   11/01/22 0100  azithromycin (ZITHROMAX) 500 mg in sodium chloride 0.9 % 250 mL IVPB  Status:  Discontinued        500 mg 250 mL/hr over 60 Minutes Intravenous Every 24 hours 11/01/22 0056 11/01/22 0107   10/31/22 2115  cefTRIAXone (ROCEPHIN) 1 g in sodium chloride 0.9 % 100 mL IVPB        1 g 200 mL/hr over 30 Minutes Intravenous  Once 10/31/22 2111 10/31/22 2333   10/31/22 2115  azithromycin (ZITHROMAX) 500 mg in sodium chloride 0.9 % 250 mL IVPB        500 mg 250 mL/hr over 60 Minutes Intravenous  Once 10/31/22 2111 11/01/22 0055      Scheduled Meds:  atorvastatin  80 mg Oral QHS   cholecalciferol  1,000 Units Oral Daily   cyanocobalamin  1,000 mcg Oral Daily   DULoxetine  30 mg Oral BID   ferrous sulfate  325 mg Oral QODAY   lamoTRIgine  25 mg Oral BID   loratadine  10 mg Oral Daily   melatonin  5 mg Oral QHS   montelukast  10 mg Oral QHS   nicotine  7 mg Transdermal Daily   pantoprazole  40 mg Oral Daily   sodium chloride flush  3 mL Intravenous Q12H   Continuous Infusions:  sodium chloride 20 mL/hr at 11/09/22 1325   [START ON 11/13/2022] sodium chloride     PRN Meds:.acetaminophen, albuterol, alum & mag hydroxide-simeth, hydrOXYzine, nitroGLYCERIN, oxyCODONE   Assessment: Principal Problem:   Anemia Active Problems:   Stage 3a chronic kidney disease (CKD) (HCC)   Essential hypertension   Coronary artery disease of native artery of native heart with stable angina pectoris (HCC)   Ischemic  cardiomyopathy   PAF (paroxysmal atrial fibrillation) (HCC)   On anticoagulant therapy   Depression   GERD (gastroesophageal reflux disease)   Hyperlipidemia   Abdominal pain   Acute blood loss anemia   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  CT angio did not reveal any evidence of active GI bleed.  EGD revealed a nonbleeding gastric ulcer.  Eliquis has been held since admission in setting of acute anemia  Plan: Discussed with patient again today, he agreed to try MiraLAX Gatorade prep to undergo colonoscopy.  If colonoscopy is negative, recommend video capsule endoscopy Recommend H. pylori breath test given presence of gastric ulcer to rule out any H. pylori infection Recommend to continue long-term Protonix 40 mg p.o. twice daily Recommend IV iron as inpatient Patient will need close follow-up with GI upon discharge N.p.o. effective 5 AM tomorrow Continue clear liquid diet today    LOS: 11 days   Tayler Lassen 11/12/2022, 11:17 PM

## 2022-11-12 NOTE — Progress Notes (Signed)
PROGRESS NOTE    Shawn Jennings  SFK:[id number] DOB: [birth year] DOA: 10/31/2022 PCP: Pcp, No   Brief Narrative:  This 77 yrs old male with PMH significant of Parox Afib on anticoagulation w/ Eliquis, DM2, CKD3a, CAD, Ischemic cardiomyopathy, HFrEF, GERD, Hx. 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  Patient presented in the ED with mild epigastric abdominal pain associated with anemia, hemoglobin 6.3 in ED . Patient has received 3 units of PRBC so far.  CTA negative for any active GI bleeding.  Patient is admitted for further evaluation.  GI is consulted.  Patient needed to be off of Eliquis for 2 days.  Patient underwent EGD on 11/06/2021 showed nonbleeding gastric ulcer, did not have preparation for colonoscopy.  SNF is pending,  TOC consulted to reach out to DSS regarding guardianship given patient's limited capacity.  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  He has received 4 units of PRBC so far.  Hemoglobin dropped to 6.9 on 11/08/22 .  Transfused 1 PRBC, Hb 8.2 Hold anticoagulation in the setting of GI bleed. Monitor H/ H q12hr, Transfuse PRBC if Hb below 7.0 GI consulted.  Patient underwent EGD 11/06/2022. EGD shows nonbleeding gastric ulcer, Patient refused preparation for colonoscopy 11/07/2022. Patient agreed for colonoscopy, GI. Notified.  Patient has not completed preparation for colonoscopy. Colonoscopy rescheduled twice, Patient has not completed prep for colonoscopy , He also refused NG tube for prep fluid. Discussed with GI and recommended RBC tagged scan. Hb 7.9 RBC tagged scan negative for active GI bleeding. GI suggested small bowel enteroscopy or capsule endoscopy  Chronic heart failure with reduced EF: Does not appear in exacerbation. Blood pressure remains soft. Holding home medications, Entresto, Lasix, spironolactone, Imdur, beta-blocker. Resume when blood pressure improves.  Paroxysmal atrial fibrillation: Heart rate is now well-controlled. Eliquis is on hold in the setting of GI bleed.   Resume Eliquis once cleared by GI.  Essential hypertension: Blood pressure is soft, hold blood pressure medications.  Hyperlipidemia Continue Lipitor 80 mg daily.  Coronary artery disease: Ischemic cardiomyopathy: Continue Lipitor, Hold Imdur, Entresto due to low BP. He denies any chest pain, palpitations.  CKD stage IIIa: Metabolic acidosis : > Resolved Discontinue sodium bicarbonate. Monitor serum creatinine, avoid nephrotoxic medications.  Cognitive impairment: Memory deficit: Patient does have some memory deficits, cognitive impairment. Have consulted TOC regarding getting him set up with DSS guardian.    Depression: Continue Cymbalta and lamotrigine.  Left AKA: Stable.  PT and OT when able.  COPD.: Stable, continue home medications.  Not in exacerbation.  Syncope:  He was hemodynamically stable.  CT head negative, ammonia level 17, ABG pCO2 34. Patient woke up after having CT head.  EKG  normal sinus rhythm. Workup negative.    DVT prophylaxis: None at this time. Code Status: Full code Family Communication: No family at bedside. Disposition Plan:   Status is: Inpatient Remains inpatient appropriate because: GI evaluation for bleeding.  EGD shows nonbleeding gastric ulcers, colonoscopy has not completed.  GI is offering small bowel enteroscopy or capsule endoscopy.   Consultants:  Gastroenterology  Procedures: 11/06/2022 showed nonbleeding gastric ulcer  Antimicrobials: Anti-infectives (From admission, onward)    Start     Dose/Rate Route Frequency Ordered Stop   11/01/22 2300  azithromycin (ZITHROMAX) 500 mg in sodium chloride 0.9 % 250 mL IVPB  Status:  Discontinued        500 mg 250 mL/hr over 60 Minutes Intravenous Every 24 hours 11/01/22 0107 11/01/22 0748   11/01/22 2200  cefTRIAXone (ROCEPHIN) 1 g in sodium chloride 0.9 % 100 mL IVPB  Status:  Discontinued        1 g 200 mL/hr over 30 Minutes Intravenous Every 24 hours 11/01/22 0107 11/01/22 0748   11/01/22 0100  cefTRIAXone (ROCEPHIN) 1 g in sodium chloride 0.9 % 100 mL IVPB  Status:  Discontinued        1 g 200 mL/hr over 30 Minutes Intravenous Every 24 hours 11/01/22 0056 11/01/22 0107   11/01/22 0100  azithromycin (ZITHROMAX) 500 mg in sodium chloride 0.9 % 250 mL IVPB  Status:  Discontinued        500 mg 250 mL/hr over 60 Minutes Intravenous Every 24 hours 11/01/22 0056 11/01/22 0107   10/31/22 2115  cefTRIAXone (ROCEPHIN) 1 g in sodium chloride 0.9 % 100 mL IVPB        1 g 200 mL/hr over 30 Minutes Intravenous  Once 10/31/22 2111 10/31/22 2333   10/31/22 2115  azithromycin (ZITHROMAX) 500 mg in sodium chloride 0.9 % 250 mL IVPB        500 mg 250 mL/hr over 60 Minutes Intravenous  Once 10/31/22 2111 11/01/22 0055      Subjective: Patient was seen and examined at bedside.Overnight events noted. Patient is still refused to have NG tube insertion or colonoscopy. RBC tagged scan negative for active  bleeding.  GI is offering small bowel enteroscopy.  Objective: Vitals:   11/11/22 1714 11/11/22 2113 11/12/22 0519 11/12/22 0910  BP: 111/65 119/71 108/62 107/64  Pulse: 79 79 72 76  Resp: '18 20 19 20  '$ Temp: 97.7 F (36.5 C) 98.6 F (37 C) 98.6 F (37 C) 98.1 F (36.7 C)  TempSrc:      SpO2: 99% 100% 100% 94%  Weight:      Height:        Intake/Output Summary (Last 24 hours) at 11/12/2022 1403 Last data filed at 11/11/2022 2323 Gross per 24 hour  Intake --  Output 900 ml  Net -900 ml   Filed Weights   10/31/22 1720  Weight: 73 kg    Examination:  General exam: Appears comfortable, not in any acute distress.  Deconditioned. Respiratory system: CTA bilaterally, respiratory effort normal, RR 16. Cardiovascular system: S1 & S2 heard, regular rate and rhythm, no murmur. Gastrointestinal system: Abdomen is soft, mildly tender, non distended, BS+ Central nervous system: Alert and oriented x 3 . No focal neurological deficits. Extremities: Left AKA, no edema, no cyanosis, no clubbing. Skin: No rashes, lesions or ulcers Psychiatry: Judgement and insight appear  normal. Mood & affect appropriate.     Data Reviewed: I have personally reviewed following labs and imaging studies  CBC: Recent Labs  Lab 11/06/22 0402 11/07/22 0528 11/07/22 0820 11/08/22 0314 11/09/22 0840 11/10/22 0530 11/11/22 0334 11/12/22 0858  WBC 10.5 9.8 8.9 11.0*  --  10.2  --   --   NEUTROABS  --   --  6.1  --   --   --   --   --   HGB 8.2* 7.4* 7.7* 6.9* 8.2* 7.9* 7.5* 8.0*  HCT 27.4* 24.8* 26.1* 23.6* 26.8* 26.4* 25.0* 26.8*  MCV 86.4 86.1 85.9 86.4  --  85.2  --   --   PLT 315 301 298 332  --  344  --   --    Basic Metabolic Panel: Recent Labs  Lab 11/06/22 0402 11/07/22 0528 11/07/22 0820 11/08/22 0314 11/10/22 0530  NA 139 138 138 138 138  K 4.4 4.2 4.1 4.3 4.5  CL 111 111 109 109 108  CO2 23 20* 21* 20* 24  GLUCOSE 119* 126* 129* 157* 93  BUN 24* 24* 24* 29* 16  CREATININE  1.24 1.19 1.30* 1.30* 1.11  CALCIUM 8.2* 8.0* 8.0* 7.9* 8.1*  MG  --  2.2  --  2.3  --   PHOS  --   --   --  4.3  --    GFR: Estimated Creatinine Clearance: 56.6 mL/min (by C-G formula based on SCr of 1.11 mg/dL). Liver Function Tests: Recent Labs  Lab 11/06/22 0402  AST 11*  ALT 10  ALKPHOS 61  BILITOT 0.7  PROT 5.4*  ALBUMIN 2.3*   No results for input(s): "LIPASE", "AMYLASE" in the last 168 hours.  Recent Labs  Lab 11/07/22 0820  AMMONIA 17   Coagulation Profile: No results for input(s): "INR", "PROTIME" in the last 168 hours.  Cardiac Enzymes: No results for input(s): "CKTOTAL", "CKMB", "CKMBINDEX", "TROPONINI" in the last 168 hours. BNP (last 3 results) No results for input(s): "PROBNP" in the last 8760 hours. HbA1C: No results for input(s): "HGBA1C" in the last 72 hours. CBG: Recent Labs  Lab 11/07/22 0758 11/07/22 0846  GLUCAP 129* 230*   Lipid Profile: No results for input(s): "CHOL", "HDL", "LDLCALC", "TRIG", "CHOLHDL", "LDLDIRECT" in the last 72 hours. Thyroid Function Tests: No results for input(s): "TSH", "T4TOTAL", "FREET4", "T3FREE", "THYROIDAB" in the last 72 hours.  Anemia Panel: No results for input(s): "VITAMINB12", "FOLATE", "FERRITIN", "TIBC", "IRON", "RETICCTPCT" in the last 72 hours. Sepsis Labs: Recent Labs  Lab 11/07/22 0820  LATICACIDVEN 1.3    No results found for this or any previous visit (from the past 240 hour(s)).  Radiology Studies: NM GI Blood Loss  Result Date: 11/11/2022 CLINICAL DATA:  GI bleeding. EXAM: NUCLEAR MEDICINE GASTROINTESTINAL BLEEDING SCAN TECHNIQUE: Sequential abdominal images were obtained following intravenous administration of Tc-79mlabeled red blood cells. RADIOPHARMACEUTICALS:  23.5 mCi Tc-966mertechnetate in-vitro labeled red cells. COMPARISON:  None Available. FINDINGS: Good blood pool activity evident. There is no evidence for accumulation over time of radiotracer in the abdomen or pelvis to suggest  a site of active GI bleeding. IMPRESSION: Negative for active GI bleeding. Electronically Signed   By: ErMisty Stanley.D.   On: 11/11/2022 12:10    Scheduled Meds:  atorvastatin  80 mg Oral QHS   cholecalciferol  1,000 Units Oral Daily   cyanocobalamin  1,000 mcg Oral Daily   DULoxetine  30 mg Oral BID   ferrous sulfate  325 mg  Oral QODAY   lamoTRIgine  25 mg Oral BID   loratadine  10 mg Oral Daily   melatonin  5 mg Oral QHS   montelukast  10 mg Oral QHS   nicotine  7 mg Transdermal Daily   pantoprazole  40 mg Oral Daily   polyethylene glycol powder  1 Container Oral Once   sodium chloride flush  3 mL Intravenous Q12H   Continuous Infusions:  sodium chloride 20 mL/hr at 11/09/22 1325   ferric gluconate (FERRLECIT) IVPB       LOS: 11 days   Time spent: 35 mins  Malayia Spizzirri, MD [facility]   If 7PM-7AM, please contact night-coverage Ankle prep liquid for colonoscopy.GI states

## 2022-11-12 NOTE — Progress Notes (Signed)
Physical Therapy Treatment Patient Details Name: Shawn Jennings MRN: [id number] DOB: 02-11-1946 Today's Date: 11/12/2022   History of Present Illness Patient is a 77 year old male presenting with epigastric pain, anemia. History of left AKA, CAD, heart failure, COPD, CKD, HTN, PAD, A-fib, diabetes, GI bleed.    PT Comments    Patient is agreeable to PT session with encouragement. With minimal assistance, the patient performed squat pivot transfer from bed to wheelchair and back to bed. Cues provided for tips to increase independence with transfers to wheelchair with limited interest in performing different techniques. He also required safety cues and minimal assistance for management of wheelchair break on the left side.  The patient was fatigued with activity. Recommend to continue PT while in the hospital to maximize independence and decrease caregiver burden at the facility.    Recommendations for follow up therapy are one component of a multi-disciplinary discharge planning process, led by the attending physician.  Recommendations may be updated based on patient status, additional functional criteria and insurance authorization.  Follow Up Recommendations  Long-term institutional care without follow-up therapy Can patient physically be transported by private vehicle: Yes   Assistance Recommended at Discharge Intermittent Supervision/Assistance  Patient can return home with the following A little help with walking and/or transfers;A little help with bathing/dressing/bathroom;Assist for transportation;Help with stairs or ramp for entrance   Equipment Recommendations  None recommended by PT    Recommendations for Other Services       Precautions / Restrictions Precautions Precautions: Fall Restrictions Weight Bearing Restrictions: No     Mobility  Bed Mobility Overal bed mobility: Needs Assistance Bed Mobility: Supine to Sit, Sit to Supine     Supine to sit: Supervision Sit  to supine: Supervision   General bed mobility comments: increased time with no physical assistance required    Transfers Overall transfer level: Needs assistance Equipment used: None Transfers: Bed to chair/wheelchair/BSC       Squat pivot transfers: Min assist     General transfer comment: squat pivot transfer performed from bed to wheelchair and wheelchair to bed. the patient required cues for safety and physical assistance to complete transfer. he was fatigued with activity and required several minute rest break before return to bed. educated patient on different techniques including removing wheelchair arm for a more lateral scoot transfer for increased independence. little regard to therapist suggestions.    Ambulation/Gait                   Theme park manager mobility: Yes Wheelchair Assistance Details (indicate cue type and reason): patient needs minimal assistance for left break management and safety cues to make sure breaks were loced prior to transfer to chair  Modified Rankin (Stroke Patients Only)       Balance Overall balance assessment: Needs assistance Sitting-balance support: Feet supported Sitting balance-Leahy Scale: Good                                      Cognition Arousal/Alertness: Awake/alert Behavior During Therapy: WFL for tasks assessed/performed Overall Cognitive Status: No family/caregiver present to determine baseline cognitive functioning                                 General Comments: encouragement  needed to participate        Exercises      General Comments General comments (skin integrity, edema, etc.): patient had no oxygen on on arrival to room. he requested to replace the supplmental 02 on due to fatigue with mobility. Sp02 98% on 2 L02      Pertinent Vitals/Pain Pain Assessment Pain Assessment: No/denies pain    Home Living                           Prior Function            PT Goals (current goals can now be found in the care plan section) Acute Rehab PT Goals Patient Stated Goal: to go home PT Goal Formulation: With patient Time For Goal Achievement: 11/19/22 Potential to Achieve Goals: Fair Progress towards PT goals: Progressing toward goals    Frequency    Min 2X/week      PT Plan Current plan remains appropriate    Co-evaluation              AM-PAC PT "6 Clicks" Mobility   Outcome Measure  Help needed turning from your back to your side while in a flat bed without using bedrails?: None Help needed moving from lying on your back to sitting on the side of a flat bed without using bedrails?: A Little Help needed moving to and from a bed to a chair (including a wheelchair)?: A Little Help needed standing up from a chair using your arms (e.g., wheelchair or bedside chair)?: A Lot Help needed to walk in hospital room?: A Lot Help needed climbing 3-5 steps with a railing? : Total 6 Click Score: 15    End of Session Equipment Utilized During Treatment: Oxygen Activity Tolerance: Patient tolerated treatment well;Patient limited by fatigue Patient left: in bed;with call bell/phone within reach;with bed alarm set Nurse Communication: Mobility status PT Visit Diagnosis: Muscle weakness (generalized) (M62.81);Difficulty in walking, not elsewhere classified (R26.2)     Time: 1206-1223 PT Time Calculation (min) (ACUTE ONLY): 17 min  Charges:  $Therapeutic Activity: 8-22 mins                     Minna Merritts, PT, MPT   [name] 11/12/2022, 2:30 PM

## 2022-11-12 NOTE — TOC Progression Note (Signed)
Transition of Care [facility]) - Progression Note    Patient Details  Name: Shawn Jennings MRN: [id number] Date of Birth: 03/15/1946  Transition of Care [facility] CM/SW Contact  Gerilyn Pilgrim, LCSW Phone Number: 11/12/2022, 12:15 PM  Clinical Narrative:   LVM with [facility] about pt returning.     Expected Discharge Plan: Assisted Living Barriers to Discharge: Continued Medical Work up  Expected Discharge Plan and Services                                               Social Determinants of Health (SDOH) Interventions SDOH Screenings   Food Insecurity: No Food Insecurity (11/01/2022)  Housing: Low Risk  (11/01/2022)  Transportation Needs: No Transportation Needs (11/01/2022)  Utilities: Not At Risk (11/01/2022)  Depression (PHQ2-9): Low Risk  (06/11/2022)  Tobacco Use: High Risk (11/07/2022)    Readmission Risk Interventions    11/08/2022    1:04 PM  Readmission Risk Prevention Plan  Transportation Screening Complete  Medication Review ([place]) Complete  PCP or Specialist appointment within 3-5 days of discharge Complete  HRI or Home Care Consult Complete  [place] Complete

## 2022-11-13 ENCOUNTER — Encounter
Admission: EM | Disposition: A | Discharge status: Nursing Home (Includes ICF) | Payer: Self-pay | Source: Skilled Nursing Facility | Attending: Family Medicine

## 2022-11-13 DIAGNOSIS — D649 Anemia, unspecified: Secondary | ICD-10-CM | POA: Diagnosis not present

## 2022-11-13 LAB — HEMOGLOBIN AND HEMATOCRIT, BLOOD
HCT: 26.6 % — ABNORMAL LOW (ref 39.0–52.0)
Hemoglobin: 8 g/dL — ABNORMAL LOW (ref 13.0–17.0)

## 2022-11-13 SURGERY — COLONOSCOPY WITH PROPOFOL
Anesthesia: General

## 2022-11-13 MED ORDER — PANTOPRAZOLE SODIUM 40 MG PO TBEC: 40.0000 mg | DELAYED_RELEASE_TABLET | Freq: Two times a day (BID) | ORAL | 2 refills | Status: AC

## 2022-11-13 MED ORDER — PANTOPRAZOLE SODIUM 40 MG PO TBEC
40.0000 mg | DELAYED_RELEASE_TABLET | Freq: Two times a day (BID) | ORAL | Status: DC
  Administered 2022-11-13 – 2022-11-14 (×2): 40 mg via ORAL
  Filled 2022-11-13 (×2): qty 1

## 2022-11-13 NOTE — Progress Notes (Addendum)
Colonoscopy has been canceled as patient did not finish the prep.  Apparently, it was witnessed by the patient's nurse he was pouring some of the prep into the trash can and onto the floor.  He did not have any bowel movement overnight.  Multiple attempts have been made to convince patient to do the bowel prep and not successful.  Therefore, do not recommend any further GI evaluation at this time Patient can follow-up in GI clinic Recommend Protonix 40 mg p.o. twice daily for treatment of peptic ulcer and continue long-term Okay to restart Eliquis with close monitoring of his anemia as outpatient Recommend IV iron With hematology follow-up or oral iron as outpatient  GI will sign off at this time, please call us back with questions or concerns  Sherri Sear, MD

## 2022-11-13 NOTE — Discharge Instructions (Signed)
Advised to follow up Gastroenterology as scheduled. Advised to take pantoprazole 40 mg every 12 hours for 3 months for gastric ulcers.

## 2022-11-13 NOTE — Progress Notes (Signed)
PROGRESS NOTE    Shawn Jennings  CZY:[id number] DOB: January 06, 1946 DOA: 10/31/2022 PCP: Pcp, No   Brief Narrative:  This 77 yrs old male with PMH significant of Parox Afib on anticoagulation w/ Eliquis, DM2, CKD3a, CAD, Ischemic cardiomyopathy, HFrEF, GERD, Hx. 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  Patient presented in the ED with mild epigastric abdominal pain associated with anemia, hemoglobin 6.3 in ED . Patient has received 3 units of PRBC so far.  CTA negative for any active GI bleeding.  Patient is admitted for further evaluation.  GI is consulted.  Patient needed to be off of Eliquis for 2 days.  Patient underwent EGD on 11/06/2021 showed nonbleeding gastric ulcer, did not have preparation for colonoscopy.  SNF is pending,  TOC consulted to reach out to DSS regarding guardianship given patient's limited capacity.  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  He has received 4 units of PRBC so far.  Hemoglobin dropped to 6.9 on 11/08/22 .  Transfused 1 PRBC, Hb 8.2 Hold anticoagulation in the setting of GI bleed. GI consulted.  Patient underwent EGD 11/06/2022. EGD shows nonbleeding gastric ulcer, Patient refused preparation for colonoscopy 11/07/2022. Patient agreed for colonoscopy, GI. Notified.  Patient has not completed preparation for colonoscopy. Colonoscopy rescheduled twice, Patient has not completed prep for colonoscopy , He also refused NG tube for prep fluid. Discussed with GI and recommended RBC tagged scan. Hb 7.9 RBC tagged scan negative for active GI bleeding. GI suggested small bowel enteroscopy or capsule endoscopy. Patient has been pouring some of the prep into the trash which was witnessed by the nurse. GI do not recommend further GI evaluation at this time.   Follow-up in GI clinic.  Continue pantoprazole 40 mg twice daily for peptic ulcer.  Chronic heart failure with reduced EF: Does not appear in exacerbation. Blood pressure remains soft. Holding home medications, Entresto, Lasix, spironolactone, Imdur, beta-blocker. Resume when blood pressure improves.  Paroxysmal atrial fibrillation: Heart rate is now well-controlled. Eliquis resumed.  Essential hypertension: Blood pressure is soft, hold blood pressure medications.  Hyperlipidemia Continue Lipitor 80 mg daily.  Coronary artery disease: Ischemic cardiomyopathy: Continue Lipitor, Hold Imdur, Entresto due to low BP. He denies any chest pain, palpitations.  CKD stage IIIa: Metabolic acidosis : > Resolved Discontinue sodium bicarbonate. Monitor serum creatinine, avoid nephrotoxic medications.  Cognitive impairment: Memory deficit: Patient does have some memory deficits, cognitive impairment. Have consulted TOC regarding getting him set up with DSS guardian.    Depression: Continue Cymbalta and lamotrigine.  Left AKA: Stable.PT/OT> return  back to ALF.  COPD.: Stable, continue home medications.  Not in exacerbation.  Syncope: He was  hemodynamically stable.  CT head negative, ammonia level 17, ABG pCO2 34. Patient woke up after having CT head.  EKG normal sinus rhythm. Workup negative.    DVT prophylaxis: None at this time. Code Status: Full code Family Communication: No family at bedside. Disposition Plan:   Status is: Inpatient Remains inpatient appropriate because: GI evaluation for bleeding.  EGD shows nonbleeding gastric ulcers, colonoscopy has not completed.  GI is offering small bowel enteroscopy or capsule endoscopy. Anticipated discharge back to assisted living facility 11/14/2022   Consultants:  Gastroenterology  Procedures: 11/06/2022 showed nonbleeding gastric ulcer  Antimicrobials: Anti-infectives (From admission, onward)    Start     Dose/Rate Route Frequency Ordered Stop   11/01/22 2300  azithromycin (ZITHROMAX) 500 mg in sodium chloride 0.9 % 250 mL IVPB  Status:  Discontinued        500 mg 250 mL/hr over 60 Minutes Intravenous Every 24 hours 11/01/22 0107 11/01/22 0748   11/01/22 2200  cefTRIAXone (ROCEPHIN) 1 g in sodium chloride 0.9 % 100 mL IVPB  Status:  Discontinued        1 g 200 mL/hr over 30 Minutes Intravenous Every 24 hours 11/01/22 0107 11/01/22 0748   11/01/22 0100  cefTRIAXone (ROCEPHIN) 1 g in sodium chloride 0.9 % 100 mL IVPB  Status:  Discontinued        1 g 200 mL/hr over 30 Minutes Intravenous Every 24 hours 11/01/22 0056 11/01/22 0107   11/01/22 0100  azithromycin (ZITHROMAX) 500 mg in sodium chloride 0.9 % 250 mL IVPB  Status:  Discontinued        500 mg 250 mL/hr over 60 Minutes Intravenous Every 24 hours 11/01/22 0056 11/01/22 0107   10/31/22 2115  cefTRIAXone (ROCEPHIN) 1 g in sodium chloride 0.9 % 100 mL IVPB        1 g 200 mL/hr over 30 Minutes Intravenous  Once 10/31/22 2111 10/31/22 2333   10/31/22 2115  azithromycin (ZITHROMAX) 500 mg in sodium chloride 0.9 % 250 mL IVPB        500 mg 250 mL/hr over 60 Minutes Intravenous  Once 10/31/22 2111 11/01/22 0055       Subjective: Patient was seen and examined at bedside.Overnight events noted. Patient has not completed prep for colonoscopy in fact he was witnessed, that he has been pouring prep liquid in the trash. RBC tagged scan negative for active bleeding.  GI recommended no further GI workup.  Objective: Vitals:   11/12/22 1623 11/12/22 2100 11/13/22 0500 11/13/22 0814  BP: 120/74 110/65 126/71 112/66  Pulse: 75 79 75 70  Resp: '20  18 18  '$ Temp: 98 F (36.7 C) 98 F (36.7 C) 98 F (36.7 C) 97.6 F (36.4 C)  TempSrc:  Oral Oral   SpO2: 98% 97% 96% 97%  Weight:      Height:        Intake/Output Summary (Last 24 hours) at 11/13/2022 1502 Last data filed at 11/12/2022 1623 Gross per 24 hour  Intake --  Output 600 ml  Net -600 ml   Filed Weights   10/31/22 1720  Weight: 73 kg    Examination:  General exam: Appears comfortable, not in any acute distress.  Deconditioned. Respiratory system: CTA bilaterally, respiratory effort normal, RR 16. Cardiovascular system: S1 & S2 heard, regular rate and rhythm, no murmur. Gastrointestinal system: Abdomen is Soft, non distended, nontender, BS+  Central nervous system: Alert and oriented x 3 . No focal neurological deficits. Extremities: Left AKA, no edema, no cyanosis, no clubbing. Skin: No rashes, lesions or ulcers Psychiatry: Judgement and insight appear normal. Mood & affect appropriate.     Data Reviewed: I have personally reviewed following labs and imaging studies  CBC: Recent Labs  Lab 11/07/22 0528 11/07/22 0820 11/08/22 0314 11/09/22 0840 11/10/22 0530 11/11/22 0334 11/12/22 0858 11/13/22 0500  WBC 9.8 8.9 11.0*  --  10.2  --   --   --   NEUTROABS  --  6.1  --   --   --   --   --   --   HGB 7.4* 7.7* 6.9* 8.2* 7.9* 7.5* 8.0* 8.0*  HCT 24.8* 26.1* 23.6* 26.8* 26.4* 25.0* 26.8* 26.6*  MCV 86.1 85.9 86.4  --  85.2  --   --   --   PLT 301 298 332  --  344  --   --   --    Basic Metabolic Panel: Recent Labs  Lab  11/07/22 0528 11/07/22 0820 11/08/22 0314 11/10/22 0530  NA 138 138 138 138  K 4.2 4.1 4.3 4.5  CL 111 109 109 108  CO2 20* 21* 20* 24  GLUCOSE 126* 129* 157* 93  BUN 24* 24* 29* 16  CREATININE 1.19 1.30* 1.30* 1.11  CALCIUM 8.0* 8.0* 7.9* 8.1*  MG 2.2  --  2.3  --   PHOS  --   --  4.3  --    GFR: Estimated Creatinine Clearance: 56.6 mL/min (by C-G formula based on SCr of 1.11 mg/dL). Liver Function Tests: No results for input(s): "AST", "ALT", "ALKPHOS", "BILITOT", "PROT", "ALBUMIN" in the last 168 hours.  No results for input(s): "LIPASE", "AMYLASE" in the last 168 hours.  Recent Labs  Lab 11/07/22 0820  AMMONIA 17   Coagulation Profile: No results for input(s): "INR", "PROTIME" in the last 168 hours.  Cardiac Enzymes: No results for input(s): "CKTOTAL", "CKMB", "CKMBINDEX", "TROPONINI" in the last 168 hours. BNP (last 3 results) No results for input(s): "PROBNP" in the last 8760 hours. HbA1C: No results for input(s): "HGBA1C" in the last 72 hours. CBG: Recent Labs  Lab 11/07/22 0758 11/07/22 0846  GLUCAP 129* 230*   Lipid Profile: No results for input(s): "CHOL", "HDL", "LDLCALC", "TRIG", "CHOLHDL", "LDLDIRECT" in the last 72 hours. Thyroid Function Tests: No results for input(s): "TSH", "T4TOTAL", "FREET4", "T3FREE", "THYROIDAB" in the last 72 hours.  Anemia Panel: No results for input(s): "VITAMINB12", "FOLATE", "FERRITIN", "TIBC", "IRON", "RETICCTPCT" in the last 72 hours. Sepsis Labs: Recent Labs  Lab 11/07/22 0820  LATICACIDVEN 1.3    No results found for this or any previous visit (from the past 240 hour(s)).  Radiology Studies: No results found.  Scheduled Meds:  atorvastatin  80 mg Oral QHS   cholecalciferol  1,000 Units Oral Daily   cyanocobalamin  1,000 mcg Oral Daily   DULoxetine  30 mg Oral BID   lamoTRIgine  25 mg Oral BID   loratadine  10 mg Oral Daily   melatonin  5 mg Oral QHS   montelukast  10 mg Oral QHS   nicotine  7 mg  Transdermal Daily   pantoprazole  40 mg Oral BID AC   sodium chloride flush  3 mL Intravenous Q12H   Continuous Infusions:  sodium chloride 20 mL/hr at 11/09/22 1325   sodium chloride Stopped (11/13/22 0001)     LOS: 12 days   Time spent: 35  mins  Shawna Clamp, MD [facility]   If 7PM-7AM, please contact night-coverage Ankle prep liquid for colonoscopy.GI states

## 2022-11-13 NOTE — Discharge Summary (Signed)
Physician Discharge Summary  Shawn Jennings NLZ:[id number] DOB: 01-Jul-1946 DOA: 10/31/2022  PCP: Pcp, No  Admit date: 10/31/2022  Discharge date: 11/14/2022  Admitted From: Home.  Disposition:  Assisted Living facility.  Recommendations for Outpatient Follow-up:  Follow up with PCP in 1-2 weeks. Please obtain BMP/CBC in one week Advised to follow up Gastroenterology as scheduled. Advised to take pantoprazole 40 mg every 12 hours for 3 months for gastric ulcers.  Home Health: None Equipment/Devices:None  Discharge Condition: Stable CODE STATUS:Full code Diet recommendation: Heart Healthy   Brief [facility] Course: This 77 yrs old male with PMH significant of Parox Afib on anticoagulation w/ Eliquis, DM2, CKD3a, CAD, Ischemic cardiomyopathy, HFrEF, GERD, Hx. 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  Patient presented in the ED with mild epigastric abdominal pain associated with anemia, hemoglobin 6.3 in ED . Patient has received 3 units of PRBC so far.  CT Abdomen negative for any active GI bleeding.  Patient was admitted for further evaluation. GI was consulted.  Patient needed to be off of Eliquis for 2 days.  Patient underwent EGD on 11/06/2021 showed nonbleeding gastric ulcer, did not have preparation for colonoscopy. Colonoscopy was cancelled. Patient refused to drink liquid for colonoscopy, infact  he was pouring liquid in the trash can. GI recommended no further GI workup. Hb remains stable, SNF is pending, Eliquis resumed.  Patient is being discharged back to assisted living facility.  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  He has received 4 units of PRBC so far.  Hb remains stable afterwards >8.2 Held anticoagulation in the setting of GI bleed. Monitor H/ H q12hr, Transfuse PRBC if Hb below 7.0 GI consulted.  Patient underwent EGD 11/06/2022. EGD shows nonbleeding gastric ulcer, Patient refused preparation for colonoscopy 11/07/2022. Colonoscopy rescheduled twice, Patient has not completed prep for colonoscopy , He also refused NG tube for prep fluid. Discussed with GI and recommended RBC tagged scan. Hb 7.9 RBC tagged scan negative for active GI bleeding. Patient poured liquid in the trash can.  Colonoscopy was canceled. Advised to follow-up with GI outpatient.   Chronic heart failure with reduced EF: Does not appear in exacerbation. Blood pressure remains soft. Holding home medications, Entresto, Lasix, spironolactone, Imdur, beta-blocker. Resume when blood pressure improves.   Paroxysmal atrial fibrillation: Heart rate is now well-controlled. Eliquis was on hold in the setting of GI bleed.   Eliquis resumed.   Essential hypertension: Blood pressure is soft, hold blood pressure medications.   Hyperlipidemia Continue Lipitor 80 mg daily.   Coronary artery disease:  Ischemic cardiomyopathy: Continue Lipitor, Hold Imdur, Entresto due to low BP. He denies any chest pain, palpitations.    CKD stage IIIa: Metabolic acidosis : > Resolved Discontinue sodium bicarbonate. Monitor serum creatinine, avoid nephrotoxic medications.   Cognitive impairment: Memory deficit: Patient does have some memory deficits, cognitive impairment. Have consulted TOC regarding getting him set up with DSS guardian.    Depression: Continue Cymbalta and lamotrigine.   Left AKA: Stable.  PT and OT when able.   COPD.: Stable, continue home medications.  Not in exacerbation.   Syncope: He was hemodynamically stable.  CT head negative, ammonia level 17, ABG pCO2 34. Patient woke up after having CT head.  EKG normal sinus rhythm. Workup negative.  Discharge Instructions  Discharge Instructions     Call MD for:  difficulty breathing, headache or visual disturbances   Complete by: As directed    Call MD for:  persistant dizziness or light-headedness   Complete by: As directed    Call MD for:  persistant nausea and vomiting   Complete by: As directed    Diet - low sodium heart healthy   Complete by: As directed    Diet Carb Modified   Complete by: As directed    Discharge instructions   Complete by: As directed    Advised to follow up Gastroenterology as scheduled. Advised to take pantoprazole 40 mg every 12 hours for 3 months for gastric ulcers.   Increase activity slowly   Complete by: As directed       Allergies as of 11/14/2022   No Known Allergies      Medication List     STOP taking these medications    Acetaminophen Extra Strength 500 MG Caps   atorvastatin 80 MG tablet Commonly known as: LIPITOR   Biofreeze Cool The Pain 4 % Gel Generic drug: Menthol (Topical Analgesic)   carvedilol 3.125 MG tablet Commonly known as: COREG   cyanocobalamin 1000 MCG tablet Commonly known as: VITAMIN B12   D 1000 25 MCG (1000 UT) capsule Generic drug: Cholecalciferol   hydrOXYzine 25 MG tablet Commonly known as: ATARAX   loratadine 10 MG tablet Commonly known as:  CLARITIN       TAKE these medications    apixaban 5 MG Tabs tablet Commonly known as: ELIQUIS Take 1 tablet (5 mg total) by mouth 2 (two) times daily.   dapagliflozin propanediol 10 MG Tabs tablet Commonly known as: FARXIGA Take 1 tablet (10 mg total) by mouth daily.   DULoxetine 30 MG capsule Commonly known as: CYMBALTA Take 30 mg by mouth 2 (two) times daily.   Entresto 24-26 MG Generic drug: sacubitril-valsartan Take 1 tablet by mouth 2 (two) times daily.   ferrous sulfate 325 (65 FE) MG tablet Take 325 mg by mouth every other day.   furosemide 20 MG tablet Commonly known as: LASIX Take 1 tablet (20 mg total) by mouth daily.   isosorbide mononitrate 30 MG 24 hr tablet Commonly known as: IMDUR Take 1 tablet (30 mg total) by mouth daily.   lamoTRIgine 25 MG tablet Commonly known as: LAMICTAL Take 25 mg by mouth 2 (two) times daily.   Melatonin 5 MG Caps Take 5 mg by mouth at bedtime.   montelukast 10 MG tablet Commonly known as: SINGULAIR Take 10 mg by mouth at bedtime.   nitroGLYCERIN 0.4 MG SL tablet Commonly known as: NITROSTAT Place 0.4 mg under the tongue every 5 (five) minutes as needed for chest pain. Give one tablet  sublingually q5 minutes x 3 doses PRN for chest pain relief   Ozempic (1 MG/DOSE) 4 MG/3ML Sopn Generic drug: Semaglutide (1 MG/DOSE) Inject 1 mg into the skin once a week.   pantoprazole 40 MG tablet Commonly known as: PROTONIX Take 1 tablet (40 mg total) by mouth 2 (two) times daily before a meal. What changed: when to take this   PreserVision AREDS Caps Take 1 capsule by mouth in the morning and at bedtime.   spironolactone 25 MG tablet Commonly known as: ALDACTONE Take 1 tablet (25 mg total) by mouth daily.   Ventolin HFA 108 (90 Base) MCG/ACT inhaler Generic drug: albuterol Inhale 2 puffs into the lungs every 6 (six) hours as needed for wheezing or shortness of breath.        Follow-up Information     [name], MD.  Go in 2 week(s).   Specialty: Gastroenterology Why: Office will call pt to schedule follow-up appt. Contact information: [place] [phone]                No Known Allergies  Consultations: Gastroenterology PCCM   Procedures/Studies: NM GI Blood Loss  Result Date: 11/11/2022 CLINICAL DATA:  GI bleeding. EXAM: NUCLEAR MEDICINE GASTROINTESTINAL BLEEDING SCAN TECHNIQUE: Sequential abdominal images were obtained following intravenous administration of Tc-40mlabeled red blood cells. RADIOPHARMACEUTICALS:  23.5 mCi Tc-955mertechnetate in-vitro labeled red cells. COMPARISON:  None Available. FINDINGS: Good blood pool activity evident. There is no evidence for accumulation over time of radiotracer in the abdomen or pelvis to suggest a site of active GI bleeding. IMPRESSION: Negative for active GI bleeding. Electronically Signed   By: ErMisty Stanley.D.   On: 11/11/2022 12:10   DG Swallowing Func-Speech Pathology  Result Date: 11/08/2022 MaCalla Kicks   11/08/2022  9:54 AM Objective Swallowing Evaluation: Type of Study: MBS-Modified Barium Swallow Study  Patient Details Name: DaSai ZinnRN: [id number] of Birth: 9/June 05, 1947oday's Date: 11/08/2022 Time: SLP Start Time (ACUTE ONLY): 0805 -SLP Stop Time (ACUTE ONLY): 0850 SLP Time Calculation (min) (ACUTE ONLY): 45 min Past Medical History: Past Medical History: Diagnosis Date  Acquired dilation of ascending aorta and aortic root ([place]  a. 06/2022 Echo: Asc Ao 4028m Aortic stenosis   a. Pt unaware of history but CT imaging 01/2019 consistent w/ AVR; b. 01/2019 Echo: Mild to mod AS. Mean grad 93m3m c. 06/2022 Echo: Nl fxn'ing prosth AoV. Asc Ao 40mm26mAD (coronary artery disease)   a. 1994 [place] CABG [place]; [place] 05/2020 Cath: Sev 3VD, patent grafts->Med Rx; c. 06/2022 NSTEMI/PCI: LM diff dzs, LAD 100m, 66m0, LCX 100p/m, OM3 mod dzs, RCA 100p, LIMA->LAD ok, RIMA->RPDA 40ost,  VG->D2 30p/95p (3.0x22 Onyx Frontier DES)--< L Radial-OM3 20ost.  Chronic HFrEF (heart failure with reduced ejection fraction) (HCC)  [place] 01/2019 Echo: EF 40-45%; b. 04/2020 Echo: EF 25-30%; c. 09/2020 Echo: EF 50-55%; d. 07/2021 Echo: EF 20-25%; e. 06/2022 Echo: EF 20-25%, glob HK, mild LVH, GrII DD, nl RV fxn, nl fxn'ing bioprosth AoV, Asc Ao 40mm. 75m (chronic kidney disease), stage III (HCC)   COPD (chronic obstructive pulmonary disease) (HCC)   Reddingression   Essential hypertension   GERD (gastroesophageal reflux disease)   Hyperlipidemia LDL goal <70   Ischemic cardiomyopathy   a. 01/2019 Echo: EF 40-45%; b. 04/2020 Echo: EF 25-30%; c. 09/2020 Echo: EF 50-55%; d. 07/2021 Echo: EF 20-25%; e. 06/2022 Echo: EF 20-25%.  PAD (peripheral artery disease) ([place])   a. 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  PAF (paroxysmal atrial fibrillation) (HCC)   a. CHA2DS2VASc = 4-->Xarelto '15mg'$  daily in setting of CKD.  Thyrotoxicosis   Tobacco abuse   Type II diabetes mellitus ([place])  Past Surgical History: Past Surgical History: Procedure Laterality Date  CARDIAC CATHETERIZATION    CHOLECYSTECTOMY    COLONOSCOPY    COLONOSCOPY WITH PROPOFOL N/A 07/20/2021  Procedure: COLONOSCOPY WITH PROPOFOL;  Surgeon: Lin Landsman, MD;  Location: [facility] ENDOSCOPY;  Service: Gastroenterology;  Laterality: N/A;  COLONOSCOPY WITH PROPOFOL N/A 11/06/2022  Procedure: COLONOSCOPY WITH PROPOFOL;  Surgeon: [name], MD;  Location: [facility] ENDOSCOPY;  Service: Endoscopy;  Laterality: N/A;  CORONARY ARTERY BYPASS GRAFT    a. [place], [place]  CORONARY STENT INTERVENTION N/A 07/18/2022  Procedure: CORONARY STENT INTERVENTION;  Surgeon: Nelva Bush, MD;  Location: [place] CV LAB;  Service: Cardiovascular;  Laterality: N/A;  ENDOSCOPIC RETROGRADE CHOLANGIOPANCREATOGRAPHY (ERCP) WITH PROPOFOL N/A 10/19/2020  Procedure: ENDOSCOPIC RETROGRADE CHOLANGIOPANCREATOGRAPHY (ERCP) WITH PROPOFOL;  Surgeon: [name], MD;  Location: [facility] ENDOSCOPY;  Service: Endoscopy;   Laterality: N/A;  ERCP N/A 12/29/2020  Procedure: ENDOSCOPIC RETROGRADE CHOLANGIOPANCREATOGRAPHY (ERCP);  Surgeon: [name], MD;  Location: [facility] ENDOSCOPY;  Service: Endoscopy;  Laterality: N/A;  ESOPHAGOGASTRODUODENOSCOPY (EGD) WITH PROPOFOL N/A 07/18/2021  Procedure: ESOPHAGOGASTRODUODENOSCOPY (EGD) WITH PROPOFOL;  Surgeon: Lin Landsman, MD;  Location: [facility] ENDOSCOPY;  Service: Gastroenterology;  Laterality: N/A;  ESOPHAGOGASTRODUODENOSCOPY (EGD) WITH PROPOFOL N/A 11/06/2022  Procedure: ESOPHAGOGASTRODUODENOSCOPY (EGD) WITH PROPOFOL;  Surgeon: [name], MD;  Location: [facility] ENDOSCOPY;  Service: Endoscopy;  Laterality: N/A;  Left AKA    RIGHT HEART CATH AND CORONARY/GRAFT ANGIOGRAPHY N/A 07/18/2022  Procedure: RIGHT HEART CATH AND CORONARY/GRAFT ANGIOGRAPHY;  Surgeon: Nelva Bush, MD;  Location: [place] CV LAB;  Service: Cardiovascular;  Laterality: N/A;  RIGHT/LEFT HEART CATH AND CORONARY ANGIOGRAPHY N/A 06/06/2020  Procedure: RIGHT/LEFT HEART CATH AND CORONARY ANGIOGRAPHY;  Surgeon: Wellington Hampshire, MD;  Location: [place] CV LAB;  Service: Cardiovascular;  Laterality: N/A; HPI: Pt is a 77 y.o. male  w/ past medical history of CAD, heart failure, CKD, CABG, COPD, oxygen supplementation at night 2L, etoh, depression, hypertension, hyperlipidemia, peripheral artery disease, paroxysmal atrial fibrillation on Eliquis, diabetes, left leg amputation presents with epigastric pain for the past 4 days.  Denies nausea or vomiting, denies GI bleeding.  Some exertional shortness of breath and generalized weakness.     Of note he has had upper GI bleeding back in 2022 which showed gastropathy.   CXR: No active cardiopulmonary disease. Streaky atelectasis or scarring  at the bases.  He resides at a [employer].   OF NOTE: receives Palliative Care services baseline.  Subjective: Pt alert, pleasant, and cooperative. Denies hx of dysphagia, but endorses chronic cough which pt attributes to  smoking.  Recommendations for follow up therapy are one component of a multi-disciplinary discharge planning process, led by the attending physician.  Recommendations may be updated based on patient status, additional functional criteria and insurance authorization. Assessment / Plan / Recommendation   11/08/2022   9:43 AM Clinical Impressions Clinical Impression Pt seen for MBSS. Pt alert, pleasant, and cooperative. hx of dysphagia, but endorses chronic cough which pt attributes to smoking. On 2L/min O2 via [place]. Pt given trials of solid, pureed, nectar-thick liquids (via tsp, cup), and thin liquids (via tsp, cup). Lordotic spinal curvature appreciated.  Pt presents with a mild oropharyngeal dysphagia. Oral phase notable for trace oral residual across consistencies  and prolonged mastication of solids. Pharyngeal phase notable for trace-mild pharyngeal stasis (vallecular, pyriform sinus, and posterior pharyngeal wall), swallow initiation at the vallecula and/or pyriform sinus, transient during the swallow penetration of nectar-thick liquids via cup, and SILENT before the swallow aspiration of thin liquids via cup. Above mentioned deficits secondary to: xerostomia, reduced base of tongue retraction, reduced hyolaryngeal elevation, reduced/mistimed laryngeal vesitbule closure, reduced pharyngeal stripping wave, and reduced laryngeal sensation. Recommend a mech soft diet with nectar-thick liquids with safe swallowing strategies/aspiration precautions as outlined below. Education: Pt educated at [employer] re: diet recommendations, safe swallowing strategies/aspiration precautions, results of assessment, rationale for thickened liquids, risks of aspiration/aspiration PNA, relationship between breathing/swallowing, and SLP POC. Pt shown videofluoroscopic images on TIMS after study was completed. Pt asked pertinent questions and verbalized satisfaction with answers provided by SLP. Sign posted in pt's room with diet  recommendations and safe swallowing strategies/aspiration precautions. RN made aware of results, recommendations and SLP POC. SLP POC: SLP to f/u per POC for diet tolerance and follow up education. Recommend post-acute SLP services for dysphagia tx.     11/08/2022   9:32 AM Treatment Recommendations Treatment Recommendations Therapy as outlined in treatment plan below     11/08/2022   9:33 AM Prognosis Prognosis for Safe Diet Advancement Fair Barriers to Reach Goals Cognitive deficits;Time post onset;Severity of deficits   11/08/2022   9:32 AM Diet Recommendations SLP Diet Recommendations Dysphagia 3 (Mech soft) solids;Nectar thick liquid Medication Administration -- Compensations Minimize environmental distractions;Slow rate;Small sips/bites Postural Changes Remain semi-upright after after feeds/meals (Comment);Seated upright at 90 degrees     11/08/2022   9:32 AM Other Recommendations Oral Care Recommendations Oral care QID Other Recommendations Order thickener from pharmacy;Prohibited food (jello, ice cream, thin soups);Remove water pitcher;Have oral suction available Follow Up Recommendations Skilled nursing-short term rehab (<3 hours/day) Functional Status Assessment Patient has had a recent decline in their functional status and/or demonstrates limited ability to make significant improvements in function in a reasonable and predictable amount of time   11/08/2022   9:32 AM Frequency and Duration  Speech Therapy Frequency (ACUTE ONLY) min 2x/week     11/08/2022   9:39 AM Oral Phase Oral - Nectar Teaspoon Lingual/palatal residue;Premature spillage Oral - Nectar Cup Premature spillage;Lingual/palatal residue Oral - Nectar Straw NT Oral - Thin Teaspoon Lingual/palatal residue;Premature spillage Oral - Thin Cup Lingual/palatal residue;Premature spillage Oral - Thin Straw NT Oral - Puree WFL Oral - Mech Soft NT Oral - Regular Impaired mastication    11/08/2022   9:40 AM Pharyngeal Phase Pharyngeal- Nectar Teaspoon Delayed  swallow initiation-pyriform sinuses;Pharyngeal residue - pyriform;Pharyngeal residue - valleculae;Reduced tongue base retraction;Reduced laryngeal elevation Pharyngeal Material does not enter airway Pharyngeal- Nectar Cup Delayed swallow initiation-pyriform sinuses;Reduced airway/laryngeal closure;Penetration/Aspiration during swallow;Pharyngeal residue - valleculae;Pharyngeal residue - pyriform;Reduced laryngeal elevation;Reduced tongue base retraction Pharyngeal Material enters airway, remains ABOVE vocal cords then ejected out Pharyngeal- Nectar Straw NT Pharyngeal- Puree Delayed swallow initiation-vallecula Pharyngeal Material does not enter airway Pharyngeal- Mechanical Soft NT Pharyngeal- Regular Delayed swallow initiation-vallecula Pharyngeal- Multi-consistency NT    11/08/2022   9:37 AM Cervical Esophageal Phase  Cervical Esophageal Phase [facility] Cherrie Gauze, M.S., [facility] [phone] [place] Denis) Quintella Baton 11/08/2022, 9:49 AM                     CT HEAD WO CONTRAST (5MM)  Result Date: 11/07/2022 CLINICAL DATA:  Altered level of consciousness. EXAM: CT HEAD WITHOUT CONTRAST  TECHNIQUE: Contiguous axial images were obtained from the base of the skull through the vertex without intravenous contrast. RADIATION DOSE REDUCTION: This exam was performed according to the departmental dose-optimization program which includes automated exposure control, adjustment of the mA and/or kV according to patient size and/or use of iterative reconstruction technique. COMPARISON:  None Available. FINDINGS: Brain: There is no evidence for acute hemorrhage, hydrocephalus, mass lesion, or abnormal extra-axial fluid collection. No definite CT evidence for acute infarction. Diffuse loss of parenchymal volume is consistent with atrophy. Patchy low attenuation in the deep hemispheric and periventricular white matter is nonspecific, but likely reflects  chronic microvascular ischemic demyelination. 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 Vascular: No hyperdense vessel or unexpected calcification. Skull: No evidence for fracture. No worrisome lytic or sclerotic lesion. Sinuses/Orbits: The visualized paranasal sinuses and mastoid air cells are clear. Visualized portions of the globes and intraorbital fat are unremarkable. Other: None. IMPRESSION: 1. No acute intracranial abnormality. 2. 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 3. Atrophy with chronic small vessel ischemic disease. Electronically Signed   By: Misty Stanley M.D.   On: 11/07/2022 08:42   CT ANGIO GI BLEED  Result Date: 10/31/2022 CLINICAL DATA:  Upper GI bleed with chest pain. EXAM: CTA ABDOMEN AND PELVIS WITHOUT AND WITH CONTRAST TECHNIQUE: Multidetector CT imaging of the abdomen and pelvis was performed using the standard protocol during bolus administration of intravenous contrast. Multiplanar reconstructed images and MIPs were obtained and reviewed to evaluate the vascular anatomy. RADIATION DOSE REDUCTION: This exam was performed according to the departmental dose-optimization program which includes automated exposure control, adjustment of the mA and/or kV according to patient size and/or use of iterative reconstruction technique. CONTRAST:  44m OMNIPAQUE IOHEXOL 350 MG/ML SOLN COMPARISON:  CTs of abdomen and pelvis with contrast 07/17/2022 and 07/15/2021. FINDINGS: VASCULAR Aorta: There is extensive calcific plaque in the aortic wall without penetrating ulcer, critical aortic stenosis or dissection. Maximum aortic caliber 2.6 cm.Recommend follow-up every 5 years. Reference: J Am Coll Radiol 27616;07:[id number] Celiac: There are ostial calcifications causing a 50% vessel origin stenosis. There are additional nonstenosing wall calcifications more distally. There are patchy calcific plaques in the splenic artery and hepatic arteries. SMA: There moderate to heavy patchy wall calcifications but no more  than 40% luminal stenosis. There is however, a high-grade proximal mixed plaque stenosis in the dominant right-sided trifurcation division of the SMA, with reconstitution distally as it extends to the left. Renals: Both renal arteries are single. There are patchy calcific plaques on the left-greater-than-right. There is up to 50-60% irregular stenosis of the left renal artery, right renal artery with up to 40% origin stenosis and otherwise well patent, with renovascular calcifications at both renal hila, more extensive on the left. IMA: Occluded. Inflow: Heavily calcified. 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 Slightly distal to this there is a 75% calcific stenosis focally just proximal the vessel bifurcation and no other focal stenosis. On the left there is a 60% common iliac arterial focal stenosis at the bifurcation. There is moderate irregular stenosis along both internal iliac arteries. Right external iliac artery demonstrating irregular up to 50% stenosis due to calcific plaques. Left external iliac artery occludes for a short-segment distal to its origin, then reconstitutes for the rest of its length but with only about 20% patency generally. Proximal Outflow: There is at least 80% calcific stenosis in the proximal right common femoral artery. The proximal deep and circumflex femoral  arteries are heavily calcified and at least moderately stenotic. The proximal right SFA is occluded. There is a bypass graft from the right common femoral artery to the lowest point of the study near the base of the scrotum, which is patent. The graft arises distal to the proximal common femoral artery stenosis. On the left, the deep and circumflex femoral arteries are heavily calcified but at least partially patent. There is occlusion of the left common femoral artery, occlusion of the native left SFA, and an occluded left femoral bypass graft arising from the  proximal common femoral artery and itself is proximally collapsed. There are multiple surgical clips in both inguinal areas. Veins: Patent as far as visualized. The left iliac veins are smaller than the right especially the left common iliac vein, which is compressed against the spine by the aorta and proximal left common iliac artery. Review of the MIP images confirms the above findings. NON-VASCULAR Lower chest: The cardiac size is normal. There are three-vessel coronary artery calcifications, aortic valve TAVR, and heavy calcification in the inferolateral mitral ring. The intraventricular blood pool is low in density consistent with anemia. There is interval worsening of bilateral lower lobe bronchial thickening, and on the right, interval worsening multifocal segmental and subsegmental bronchial/bronchiolar plugging in the middle and lower lobes, with patchy tree-in-bud interstitial changes also worsened. There is associated hazy airspace disease in the right middle lobe, posterior basal consolidation right lower lobe. Findings consistent with bronchopneumonia and probable aspiration pneumonitis. Please correlate clinically and consider aspiration precautions. Lung bases elsewhere are clear. Hepatobiliary: Mildly steatotic liver without mass. Surgically absent gallbladder without biliary dilatation. Pancreas: No abnormality. Spleen: No abnormality.  No splenomegaly. Adrenals/Urinary Tract: There is no adrenal mass. There are a few cortical hypodensities in both kidneys which are too small to characterize. These are unchanged. Again noted is a 1.7 cm complex septated cystic lesion in the superior pole of the right kidney. Although unchanged, MRI follow-up is recommended for further evaluation. There are renovascular calcifications at the renal hila but no urolithiasis is seen, no hydronephrosis or urinary obstruction. The bladder is unremarkable. 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 Endoscopy may be indicated but this seems unchanged. There is a debris-filled descending duodenal diverticulum again measuring 2.6 cm. The small and large bowel show no acute abnormality. The appendix is normal caliber. There is chronic dilatation of a left lower quadrant postsurgical small bowel segment, without associated obstruction. There is moderate retained stool ascending and transverse colon with terminal ileal fecal backup. There are scattered uncomplicated sigmoid diverticula. No contrast extravasation is seen into bowel. Lymphatic: No adenopathy is seen. Reproductive: There is a normal size prostate with dystrophic calcifications centrally. Both testicles are in the scrotal sac. There are epididymal tail calcifications. No hydroceles. Other: There is no free air, free fluid, free hemorrhage or incarcerated hernia. Musculoskeletal: There is osteopenia with degenerative changes of the spine. There is mild chronic anterior wedging of the T12 vertebral body. L5 is transitional. There are chronic healed left pubic rami fractures. IMPRESSION: 1. No appreciable active GI bleeding. 2. Extensive aortic and branch vessel atherosclerosis. 3. 2.6 cm abdominal aortic caliber. Imaging follow-up every 5 years recommended. 4. 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 5. High-grade proximal mixed plaque stenosis in the dominant right-sided trifurcation division of the SMA, with reconstitution distally. 6. Bilateral renal artery stenoses, left greater than right. 7. Left external iliac artery proximal occlusion with about 20% reconstitution for  the rest of its length. 8. Right common femoral artery bypass graft is patent as far as seen. 9. Bilateral native SFA occlusion, occluded left common femoral artery, and occluded left femoral bypass graft. 10. Interval worsening of bilateral lower lobe bronchial thickening and right middle and lower lobe segmental and subsegmental  bronchial/bronchiolar plugging with associated tree-in-bud interstitial changes and increased airspace disease in the right middle lobe and posterior basal right lower lobe. Findings consistent with bronchopneumonia with high likelihood of aspiration etiology. Please correlate clinically and consider aspiration precautions. 11. 1.7 cm complex septated cystic lesion in the superior pole of the right kidney. Nonemergent MRI follow-up is recommended. 12. Constipation with diverticulosis and distal ileal fecal back up. 13. Remaining findings discussed above. Aortic Atherosclerosis (ICD10-I70.0). Electronically Signed   By: Telford Nab M.D.   On: 10/31/2022 20:56   DG Chest 2 View  Result Date: 10/31/2022 CLINICAL DATA:  Chest pain EXAM: CHEST - 2 VIEW COMPARISON:  07/17/2022 FINDINGS: Post sternotomy changes. No acute airspace disease or effusion. Streaky atelectasis or scarring at the posterior lung base. Valve prosthesis. Stable cardiomediastinal silhouette with aortic atherosclerosis. No pneumothorax IMPRESSION: No active cardiopulmonary disease. Streaky atelectasis or scarring at the bases. Electronically Signed   By: Donavan Foil M.D.   On: 10/31/2022 17:59   EGD   Subjective: Patient was seen and examined at bed side, denies overnight events. He is doing better and wants to be discharged to Assisted living facility.  Discharge Exam: Vitals:   11/14/22 0451 11/14/22 0801  BP: (!) 98/58 103/67  Pulse: 94 87  Resp: 20 18  Temp: 98 F (36.7 C) 98.1 F (36.7 C)  SpO2: 95% 95%   Vitals:   11/13/22 0814 11/13/22 2031 11/14/22 0451 11/14/22 0801  BP: 112/66 111/60 (!) 98/58 103/67  Pulse: 70 82 94 87  Resp: '18 18 20 18  '$ Temp: 97.6 F (36.4 C) 98.5 F (36.9 C) 98 F (36.7 C) 98.1 F (36.7 C)  TempSrc:  Oral    SpO2: 97% 95% 95% 95%  Weight:      Height:        General: Pt is alert, awake, not in acute distress Cardiovascular: RRR, S1/S2 +, no rubs, no gallops Respiratory: CTA  bilaterally, no wheezing, no rhonchi Abdominal: Soft, NT, ND, bowel sounds + Extremities:  Left AKA, no edema, no cyanosis    The results of significant diagnostics from this hospitalization (including imaging, microbiology, ancillary and laboratory) are listed below for reference.     Microbiology: No results found for this or any previous visit (from the past 240 hour(s)).   Labs: BNP (last 3 results) No results for input(s): "BNP" in the last 8760 hours. Basic Metabolic Panel: Recent Labs  Lab 11/08/22 0314 11/10/22 0530  NA 138 138  K 4.3 4.5  CL 109 108  CO2 20* 24  GLUCOSE 157* 93  BUN 29* 16  CREATININE 1.30* 1.11  CALCIUM 7.9* 8.1*  MG 2.3  --   PHOS 4.3  --    Liver Function Tests: No results for input(s): "AST", "ALT", "ALKPHOS", "BILITOT", "PROT", "ALBUMIN" in the last 168 hours. No results for input(s): "LIPASE", "AMYLASE" in the last 168 hours. No results for input(s): "AMMONIA" in the last 168 hours.  CBC: Recent Labs  Lab 11/08/22 0314 11/09/22 0840 11/10/22 0530 11/11/22 0334 11/12/22 0858 11/13/22 0500  WBC 11.0*  --  10.2  --   --   --   HGB 6.9* 8.2* 7.9* 7.5*  8.0* 8.0*  HCT 23.6* 26.8* 26.4* 25.0* 26.8* 26.6*  MCV 86.4  --  85.2  --   --   --   PLT 332  --  344  --   --   --    Cardiac Enzymes: No results for input(s): "CKTOTAL", "CKMB", "CKMBINDEX", "TROPONINI" in the last 168 hours. BNP: Invalid input(s): "POCBNP" CBG: No results for input(s): "GLUCAP" in the last 168 hours.  D-Dimer No results for input(s): "DDIMER" in the last 72 hours. Hgb A1c No results for input(s): "HGBA1C" in the last 72 hours. Lipid Profile No results for input(s): "CHOL", "HDL", "LDLCALC", "TRIG", "CHOLHDL", "LDLDIRECT" in the last 72 hours. Thyroid function studies No results for input(s): "TSH", "T4TOTAL", "T3FREE", "THYROIDAB" in the last 72 hours.  Invalid input(s): "FREET3" Anemia work up No results for input(s): "VITAMINB12", "FOLATE",  "FERRITIN", "TIBC", "IRON", "RETICCTPCT" in the last 72 hours. Urinalysis    Component Value Date/Time   COLORURINE STRAW (A) 07/15/2021 1839   APPEARANCEUR CLEAR (A) 07/15/2021 1839   LABSPEC 1.014 07/15/2021 1839   PHURINE 5.0 07/15/2021 1839   GLUCOSEU 150 (A) 07/15/2021 1839   HGBUR NEGATIVE 07/15/2021 1839   BILIRUBINUR NEGATIVE 07/15/2021 1839   KETONESUR NEGATIVE 07/15/2021 1839   PROTEINUR NEGATIVE 07/15/2021 1839   NITRITE NEGATIVE 07/15/2021 1839   LEUKOCYTESUR NEGATIVE 07/15/2021 1839   Sepsis Labs Recent Labs  Lab 11/08/22 0314 11/10/22 0530  WBC 11.0* 10.2   Microbiology No results found for this or any previous visit (from the past 240 hour(s)).   Time coordinating discharge: Over 30 minutes  SIGNED:   Shawna Clamp, MD  [facility] 11/14/2022, 10:47 AM Pager   If 7PM-7AM, please contact night-coverage

## 2022-11-13 NOTE — NC FL2 (Signed)
[place] LEVEL OF CARE FORM     IDENTIFICATION  Patient Name: Shawn Jennings Birthdate: [birth year] Sex: male Admission Date (Current Location): 10/31/2022  [facility] [place] Number:  Engineering geologist and Address:  [facility], [address] [place], [place] [address]      Provider Number: [id number]  Attending Physician Name and Address:  Shawna Clamp, MD  Relative Name and Phone Number:       Current Level of Care: Hospital Recommended Level of Care: Assisted Living Facility Prior Approval Number:    Date Approved/Denied:   PASRR Number:    Discharge Plan: Domiciliary (Rest home)    Current Diagnoses: Patient Active Problem List   Diagnosis Date Noted   Gastric ulcer 11/06/2022   Blood loss anemia 00/34/9179   Metabolic acidosis 15/02/6978   Dyspepsia 11/01/2022   Atherosclerosis 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 ([place]) 08/08/2022   Aspiration pneumonia ([place]) 07/18/2022   Dyslipidemia 07/18/2022   Non-STEMI (non-ST elevated myocardial infarction) ([place]) 07/17/2022   HFrEF (heart failure with reduced ejection fraction) ([place]) 06/11/2022   Anemia 11/09/2021   NSTEMI (non-ST elevated myocardial infarction) ([place]) 11/08/2021   Acute on chronic systolic CHF (congestive heart failure) ([place]) 11/08/2021   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[place]) 08/04/2021   Chronic obstructive pulmonary disease, unspecified ([place]) 08/04/2021   Hyperthyroidism 08/04/2021   CHF exacerbation ([place]) 48/10/6551   Acute diastolic CHF (congestive heart failure) ([place]) 08/03/2021   COVID-19 virus infection 08/03/2021   Hypotension    Acute kidney injury superimposed on CKD (HCC)    Acute blood loss anemia    Hypokalemia    Type 2 diabetes mellitus with stage 3a chronic kidney disease, without long-term current use of insulin (HCC)    Abdominal pain    GI bleed 07/15/2021   PAF (paroxysmal atrial fibrillation) ([place])  07/15/2021   On anticoagulant therapy 07/15/2021   Depression 07/15/2021   GERD (gastroesophageal reflux disease) 07/15/2021   Tobacco abuse 07/15/2021   Hyperlipidemia 07/15/2021   Upper GI bleed    Encounter for fitting and adjustment of other gastrointestinal appliance and device    Cholangitis    Protein-calorie malnutrition, severe 10/18/2020   Choledocholithiasis 10/17/2020   Coronary artery disease of native artery of native heart with stable angina pectoris (HCC)    Ischemic cardiomyopathy    Ischemic chest pain ([place]) 03/10/2020   Chronic kidney disease, stage 3a ([place]) 03/09/2020   Essential hypertension 03/09/2020   Chest pain 01/27/2019   Stage 3a chronic kidney disease (CKD) (HCC)    Nonspecific chest pain 01/26/2019    Orientation RESPIRATION BLADDER Height & Weight     Self, Situation, Place  Normal External catheter Weight: 161 lb (73 kg) Height:  6' (182.9 cm)  BEHAVIORAL SYMPTOMS/MOOD NEUROLOGICAL BOWEL NUTRITION STATUS      Continent Diet (heart healthy)  AMBULATORY STATUS COMMUNICATION OF NEEDS Skin   Supervision Verbally Normal                       Personal Care Assistance Level of Assistance  Bathing, Feeding, Dressing Bathing Assistance: Limited assistance Feeding assistance: Limited assistance Dressing Assistance: Limited assistance     Functional Limitations Info  Sight, Hearing, Speech Sight Info: Adequate Hearing Info: Impaired Speech Info: Adequate    SPECIAL CARE FACTORS FREQUENCY  Contractures Contractures Info: Not present    Additional Factors Info  Code Status, Isolation Precautions Code Status Info: FULL       Isolation Precautions Info: MRSA     Current Medications (11/13/2022):  This is the current hospital active medication list Current Facility-Administered Medications  Medication Dose Route Frequency Provider Last Rate Last Admin   0.9 %  sodium chloride infusion   Intravenous  Continuous [name], MD 20 mL/hr at 11/09/22 1325 New Bag at 11/09/22 1325   0.9 %  sodium chloride infusion   Intravenous Continuous Lin Landsman, MD   Held at 11/13/22 0001   acetaminophen (TYLENOL) tablet 650 mg  650 mg Oral Q6H PRN Gertie Fey, MD   650 mg at 11/02/22 0512   albuterol (PROVENTIL) (2.5 MG/3ML) 0.083% nebulizer solution 2.5 mg  2.5 mg Nebulization Q6H PRN Renda Rolls, RPH   2.5 mg at 11/01/22 2218   alum & mag hydroxide-simeth (MAALOX/MYLANTA) 200-200-20 MG/5ML suspension 15 mL  15 mL Oral Q4H PRN Gertie Fey, MD   15 mL at 11/02/22 0512   atorvastatin (LIPITOR) tablet 80 mg  80 mg Oral Rollen Sox, MD   80 mg at 11/12/22 2143   cholecalciferol (VITAMIN D3) 25 MCG (1000 UNIT) tablet 1,000 Units  1,000 Units Oral Daily Gertie Fey, MD   1,000 Units at 11/13/22 1005   cyanocobalamin (VITAMIN B12) tablet 1,000 mcg  1,000 mcg Oral Daily Gertie Fey, MD   1,000 mcg at 11/13/22 1006   DULoxetine (CYMBALTA) DR capsule 30 mg  30 mg Oral BID Renda Rolls, RPH   30 mg at 11/13/22 1005   hydrOXYzine (ATARAX) tablet 25 mg  25 mg Oral Q6H PRN Gertie Fey, MD   25 mg at 11/02/22 9381   lamoTRIgine (LAMICTAL) tablet 25 mg  25 mg Oral BID Gertie Fey, MD   25 mg at 11/13/22 1005   loratadine (CLARITIN) tablet 10 mg  10 mg Oral Daily Gertie Fey, MD   10 mg at 11/13/22 1005   melatonin tablet 5 mg  5 mg Oral QHS Gertie Fey, MD   5 mg at 11/12/22 2143   montelukast (SINGULAIR) tablet 10 mg  10 mg Oral Rollen Sox, MD   10 mg at 11/12/22 2146   nicotine (NICODERM CQ - dosed in mg/24 hr) patch 7 mg  7 mg Transdermal Daily Emeterio Reeve, DO   7 mg at 11/13/22 1010   nitroGLYCERIN (NITROSTAT) SL tablet 0.4 mg  0.4 mg Sublingual Q5 min PRN Gertie Fey, MD       oxyCODONE (Oxy IR/ROXICODONE) immediate release tablet 5 mg  5 mg Oral Q4H PRN Gertie Fey, MD   5 mg at 11/10/22 2228   pantoprazole (PROTONIX) EC tablet 40 mg  40 mg Oral BID AC Vanga, Tally Due, MD        sodium chloride flush (NS) 0.9 % injection 3 mL  3 mL Intravenous Q12H Gertie Fey, MD   3 mL at 11/13/22 1010     Discharge Medications:   STOP taking these medications     Acetaminophen Extra Strength 500 MG Caps    atorvastatin 80 MG tablet Commonly known as: LIPITOR    Biofreeze Cool The Pain 4 % Gel Generic drug: Menthol (Topical Analgesic)    carvedilol 3.125 MG tablet Commonly known as: COREG    cyanocobalamin 1000 MCG tablet Commonly known as: VITAMIN B12    D 1000 25 MCG (1000 UT) capsule Generic drug:  Cholecalciferol    hydrOXYzine 25 MG tablet Commonly known as: ATARAX    loratadine 10 MG tablet Commonly known as: CLARITIN           TAKE these medications     apixaban 5 MG Tabs tablet Commonly known as: ELIQUIS Take 1 tablet (5 mg total) by mouth 2 (two) times daily.    dapagliflozin propanediol 10 MG Tabs tablet Commonly known as: FARXIGA Take 1 tablet (10 mg total) by mouth daily.    DULoxetine 30 MG capsule Commonly known as: CYMBALTA Take 30 mg by mouth 2 (two) times daily.    Entresto 24-26 MG Generic drug: sacubitril-valsartan Take 1 tablet by mouth 2 (two) times daily.    ferrous sulfate 325 (65 FE) MG tablet Take 325 mg by mouth every other day.    furosemide 20 MG tablet Commonly known as: LASIX Take 1 tablet (20 mg total) by mouth daily.    isosorbide mononitrate 30 MG 24 hr tablet Commonly known as: IMDUR Take 1 tablet (30 mg total) by mouth daily.    lamoTRIgine 25 MG tablet Commonly known as: LAMICTAL Take 25 mg by mouth 2 (two) times daily.    Melatonin 5 MG Caps Take 5 mg by mouth at bedtime.    montelukast 10 MG tablet Commonly known as: SINGULAIR Take 10 mg by mouth at bedtime.    nitroGLYCERIN 0.4 MG SL tablet Commonly known as: NITROSTAT Place 0.4 mg under the tongue every 5 (five) minutes as needed for chest pain. Give one tablet sublingually q5 minutes x 3 doses PRN for chest pain relief    Ozempic (1  MG/DOSE) 4 MG/3ML Sopn Generic drug: Semaglutide (1 MG/DOSE) Inject 1 mg into the skin once a week.    pantoprazole 40 MG tablet Commonly known as: PROTONIX Take 1 tablet (40 mg total) by mouth 2 (two) times daily before a meal. What changed: when to take this    PreserVision AREDS Caps Take 1 capsule by mouth in the morning and at bedtime.    spironolactone 25 MG tablet Commonly known as: ALDACTONE Take 1 tablet (25 mg total) by mouth daily.    Ventolin HFA 108 (90 Base) MCG/ACT inhaler Generic drug: albuterol Inhale 2 puffs into the lungs every 6 (six) hours as needed for wheezing or shortness of breath.       Relevant Imaging Results:  Relevant Lab Results:   Additional Information SS# [id number]  Gerilyn Pilgrim, LCSW

## 2022-11-13 NOTE — Progress Notes (Addendum)
Pt due to have colonoscopy today has not been able to finish bowel prep, About half the jug is complete he has apparently been pouring some of the prep into the floor/ trash can to make it look like he's been drinking, not 100% sure how much he's exactly consumed. Hospitalist [place] and GI Sherri Sear, MD notified.

## 2022-11-13 NOTE — Care Management Important Message (Signed)
Important Message  Patient Details  Name: Shawn Jennings MRN: [id number] Date of Birth: 03-Dec-1945   Medicare Important Message Given:  Yes     Juliann Pulse A Dontee Jaso 11/13/2022, 1:32 PM

## 2022-11-14 DIAGNOSIS — D649 Anemia, unspecified: Secondary | ICD-10-CM | POA: Diagnosis not present

## 2022-11-14 LAB — H. PYLORI BREATH TEST: H. pylori UBiT: NEGATIVE

## 2022-11-14 NOTE — TOC Transition Note (Signed)
Transition of Care [facility]) - CM/SW Discharge Note   Patient Details  Name: Shawn Jennings MRN: [id number] Date of Birth: 09/25/1946  Transition of Care [facility]) CM/SW Contact:  Gerilyn Pilgrim, LCSW Phone Number: 11/14/2022, 10:54 AM   Clinical Narrative:  Pt has orders to discharge back to [facility]. DC summary and FL2 sent to [employer] at [facility]. [facility] arranged, pt is first in line [place] with [place] notified. CSW signing off.     Final next level of care: Assisted Living Barriers to Discharge: Barriers Resolved   Patient Goals and CMS Choice CMS Medicare.gov Compare Post Acute Care list provided to:: Patient    Discharge Placement                  Patient to be transferred to facility by: [facility]      Discharge Plan and Services Additional resources added to the After Visit Summary for                                       Social Determinants of Health (SDOH) Interventions SDOH Screenings   Food Insecurity: No Food Insecurity (11/01/2022)  Housing: Low Risk  (11/01/2022)  Transportation Needs: No Transportation Needs (11/01/2022)  Utilities: Not At Risk (11/01/2022)  Depression (PHQ2-9): Low Risk  (06/11/2022)  Tobacco Use: High Risk (11/07/2022)     Readmission Risk Interventions    11/08/2022    1:04 PM  Readmission Risk Prevention Plan  Transportation Screening Complete  Medication Review ([place]) Complete  PCP or Specialist appointment within 3-5 days of discharge Complete  HRI or Home Care Consult Complete  Palliative Care Screening Not [place] Complete

## 2022-11-14 NOTE — Progress Notes (Signed)
Call and gave report to Junius Creamer at [facility]

## 2022-11-15 NOTE — Progress Notes (Deleted)
Patient ID: Shawn Jennings, male    DOB: 1945/12/15, 77 y.o.   MRN: [id number]  HPI  Shawn Jennings is a 77 y/o male with a history of CAD, DM, hyperlipidemia, HTN, CKD, thyroid disease, COPD, depression, GERD, PAF, PAD, current tobacco use and chronic heart failure.   Echo done 07/18/22 showed EF of 20-25% along with mild LVH with aortic bioprosthetic valve. Echo report from 08/03/21 reviewed and showed an EF of 20-25% along with mild Shawn.   RHC done 07/18/22 showed: Severe native coronary artery disease with chronic total occlusions of the mid LAD, mid LCx, and proximal RCA (unchanged from prior catheterization in 2021). Widely patent LIMA-LAD. Patent RIMA-RCA with 40% ostial stenosis. Patent Y graft supplying D2 and OM3 with 95% stenosis in the SVG portion to D2 just after the origin of radial-OM3. Normal left heart, right heart, and pulmonary artery pressures. Mildly reduced Fick cardiac output/index. Successful PCI to SVG-D2 using Onyx frontier 3.0 x 22 mm drug-eluting stent (postdilated to 3.6 mm) with 0% residual stenosis and TIMI-3 flow.  The stent jails the radial-OM3 limb, with stable 20% stenosis at the ostium of the graft. Occluded/heavily stenosed right radial artery that is inadequate for catheterization.  RHC/LHC done 06/06/20 and showed: Prox Cx to Mid Cx lesion is 100% stenosed. Mid LAD lesion is 100% stenosed. Prox RCA lesion is 100% stenosed. LIMA graft was visualized by non-selective angiography and is normal in caliber. The graft exhibits no disease. RIMA graft was visualized by non-selective angiography and is normal in caliber. The graft exhibits no disease.  Admitted 10/31/22 due to abdominal pain due to GIB. Blood transfusion given. EGD done. Would not drink prep for colonoscopy. Admitted 07/17/22 due to NSTEMI.   He presents today for a follow-up visit from [facility].   Past Medical History:  Diagnosis Date   Acquired dilation of ascending aorta and aortic root  ([place])    a. 06/2022 Echo: Asc Ao 46m.   Aortic stenosis    a. Pt unaware of history but CT imaging 01/2019 consistent w/ AVR; b. 01/2019 Echo: Mild to mod AS. Mean grad 157mg; c. 06/2022 Echo: Nl fxn'ing prosth AoV. Asc Ao 4065m  CAD (coronary artery disease)    [place]. [place] 05[place] Cath: Sev 3VD, patent grafts->Med Rx; c. 06/2022 NSTEMI/PCI: LM diff dzs, LAD 100m80m 40, LCX 100p/m, OM3 mod dzs, RCA 100p, LIMA->LAD ok, RIMA->RPDA 40ost, VG->D2 30p/95p (3.0x22 Onyx Frontier DES)--< L Radial-OM3 20ost.   Chronic HFrEF (heart failure with reduced ejection fraction) (HCC)[place] a. 01/2019 Echo: EF 40-45%; b. 04/2020 Echo: EF 25-30%; c. 09/2020 Echo: EF 50-55%; d. 07/2021 Echo: EF 20-25%; e. 06/2022 Echo: EF 20-25%, glob HK, mild LVH, GrII DD, nl RV fxn, nl fxn'ing bioprosth AoV, Asc Ao 40mm55mCKD (chronic kidney disease), stage III (HCC)    [place] (ch[place] obstructive [place] disease[place]    Essential hypertension    GERD (gastroesophageal reflux disease)    Hyperlipidemia LDL goal <70    Ischemic cardiomyopathy    a. 01/2019 Echo: EF 40-45%; b. 04/2020 Echo: EF 25-30%; c. 09/2020 Echo: EF 50-55%; d. 07/2021 Echo: EF 20-25%; e. 06/2022 Echo: EF 20-25%.   PAD (peripheral artery disease) (HCC) [place]. 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   PAF (paroxysmal atrial fibrillation) (HCC)    a. CHA2DS2VASc = 4-->Xarelto '15mg'$  daily in setting of CKD.   Thyrotoxicosis    Tobacco abuse  Type II diabetes mellitus ([place])    Past Surgical History:  Procedure Laterality Date   CARDIAC CATHETERIZATION     CHOLECYSTECTOMY     COLONOSCOPY     COLONOSCOPY WITH PROPOFOL N/A 07/20/2021   Procedure: COLONOSCOPY WITH PROPOFOL;  Surgeon: Lin Landsman, MD;  Location: [facility] ENDOSCOPY;  Service: Gastroenterology;  Laterality: N/A;   COLONOSCOPY WITH PROPOFOL N/A 11/06/2022   Procedure: COLONOSCOPY WITH PROPOFOL;  Surgeon: [name], MD;  Location: [facility] ENDOSCOPY;  Service: Endoscopy;  Laterality: N/A;    CORONARY ARTERY BYPASS GRAFT     a. [place], [place]   CORONARY STENT INTERVENTION N/A 07/18/2022   Procedure: CORONARY STENT INTERVENTION;  Surgeon: Nelva Bush, MD;  Location: [place] CV LAB;  Service: Cardiovascular;  Laterality: N/A;   ENDOSCOPIC RETROGRADE CHOLANGIOPANCREATOGRAPHY (ERCP) WITH PROPOFOL N/A 10/19/2020   Procedure: ENDOSCOPIC RETROGRADE CHOLANGIOPANCREATOGRAPHY (ERCP) WITH PROPOFOL;  Surgeon: [name], MD;  Location: [facility] ENDOSCOPY;  Service: Endoscopy;  Laterality: N/A;   ERCP N/A 12/29/2020   Procedure: ENDOSCOPIC RETROGRADE CHOLANGIOPANCREATOGRAPHY (ERCP);  Surgeon: [name], MD;  Location: [facility] ENDOSCOPY;  Service: Endoscopy;  Laterality: N/A;   ESOPHAGOGASTRODUODENOSCOPY (EGD) WITH PROPOFOL N/A 07/18/2021   Procedure: ESOPHAGOGASTRODUODENOSCOPY (EGD) WITH PROPOFOL;  Surgeon: Lin Landsman, MD;  Location: [facility] ENDOSCOPY;  Service: Gastroenterology;  Laterality: N/A;   ESOPHAGOGASTRODUODENOSCOPY (EGD) WITH PROPOFOL N/A 11/06/2022   Procedure: ESOPHAGOGASTRODUODENOSCOPY (EGD) WITH PROPOFOL;  Surgeon: [name], MD;  Location: [facility] ENDOSCOPY;  Service: Endoscopy;  Laterality: N/A;   Left AKA     RIGHT HEART CATH AND CORONARY/GRAFT ANGIOGRAPHY N/A 07/18/2022   Procedure: RIGHT HEART CATH AND CORONARY/GRAFT ANGIOGRAPHY;  Surgeon: Nelva Bush, MD;  Location: [place] CV LAB;  Service: Cardiovascular;  Laterality: N/A;   RIGHT/LEFT HEART CATH AND CORONARY ANGIOGRAPHY N/A 06/06/2020   Procedure: RIGHT/LEFT HEART CATH AND CORONARY ANGIOGRAPHY;  Surgeon: Wellington Hampshire, MD;  Location: [place] CV LAB;  Service: Cardiovascular;  Laterality: N/A;   Family History  Problem Relation Age of Onset   Heart attack Mother        died @ 59.   Other Father        never knew his father.   Other Sister        overdose of sleeping pills.   Heart disease Brother        died in his 23's   Other Sister        complication of abd surgery @ 2   CAD  Sister    Social History   Tobacco Use   Smoking status: Every Day    Packs/day: 1.00    Years: 62.00    Total pack years: 62.00    Types: Cigarettes   Smokeless tobacco: Never  Substance Use Topics   Alcohol use: Never   No Known Allergies    Review of Systems  Constitutional:  Negative for appetite change, fatigue and unexpected weight change.  HENT:  Negative for congestion, postnasal drip and sore throat.   Eyes: Negative.   Respiratory:  Negative for cough and chest tightness.   Cardiovascular:  Negative for chest pain and leg swelling.  Gastrointestinal:  Negative for abdominal distention, constipation and diarrhea.  Endocrine: Negative.   Genitourinary: Negative.  Negative for difficulty urinating.  Musculoskeletal:  Negative for back pain and neck pain.  Skin: Negative.   Allergic/Immunologic: Negative.   Neurological:  Negative for dizziness and light-headedness.  Hematological:  Negative for adenopathy. Does not bruise/bleed easily.  Psychiatric/Behavioral:  Negative  for dysphoric mood and sleep disturbance (sleeping on 2 pillows). The patient is not nervous/anxious.        Physical Exam Vitals and nursing note reviewed.  Constitutional:      Appearance: Normal appearance.  HENT:     Head: Normocephalic and atraumatic.  Cardiovascular:     Rate and Rhythm: Normal rate and regular rhythm.  Pulmonary:     Effort: Pulmonary effort is normal. No respiratory distress.     Breath sounds: Rales present. No wheezing.  Abdominal:     General: There is no distension.     Palpations: Abdomen is soft.     Tenderness: There is no abdominal tenderness.  Musculoskeletal:        General: Deformity (left AKA) present.     Cervical back: Normal range of motion and neck supple.     Right lower leg: No edema.  Skin:    General: Skin is warm and dry.  Neurological:     General: No focal deficit present.     Mental Status: He is alert and oriented to person, place, and  time.  Psychiatric:        Mood and Affect: Mood normal.        Behavior: Behavior normal.        Thought Content: Thought content normal.     Assessment & Plan:  1: Chronic heart failure with reduced ejection fraction- - NYHA class I - euvolemic today - getting weighed most days and weighs 135-136 pounds; reminded to call for an overnight weight gain of > 2 pounds or a weekly weight gain of >5 pounds - weight chart reviewed - not adding salt but admits that he "loves" salt; encouraged him to continue to not add any - on GDMT of farxiga, carvedilol & entresto - palliative care visit done 10/25/22 - BNP 11/08/21 was 2623.7  2: HTN- - BP  - PCP is [facility] - BMP 11/10/22 reviewed and showed sodium 138, potassium 4.5, creatinine 1.11 and GFR >60  3: DM- - A1c 07/17/22 was 7.7%  4: Atrial fibrillation- - cardiology Gilford Rile) 02/16/21 but NS on 09/01/21 - currently taking apixaban  5: Tobacco use- - smoking 5-6 cigarettes daily & has been smoking since the age of 53 - no desire to quit - denies alcohol    Facility medication list reviewed.

## 2022-11-16 ENCOUNTER — Encounter: Payer: Medicare Other | Admitting: Family

## 2022-11-16 ENCOUNTER — Telehealth: Payer: Self-pay | Admitting: Family

## 2022-11-16 NOTE — Telephone Encounter (Signed)
Patient did not show for his [facility] appointment on 11/16/22. Will attempt to reschedule.

## 2022-11-26 ENCOUNTER — Inpatient Hospital Stay
Admission: EM | Admit: 2022-11-26 | Discharge: 2022-11-29 | DRG: 377 | Disposition: A | Discharge status: Skilled Nursing Facility | Payer: Medicare Other | Source: Skilled Nursing Facility | Attending: Internal Medicine | Admitting: Internal Medicine

## 2022-11-26 ENCOUNTER — Emergency Department: Payer: Medicare Other

## 2022-11-26 ENCOUNTER — Encounter: Payer: Self-pay | Admitting: Internal Medicine

## 2022-11-26 DIAGNOSIS — K449 Diaphragmatic hernia without obstruction or gangrene: Secondary | ICD-10-CM | POA: Diagnosis present

## 2022-11-26 DIAGNOSIS — Z89612 Acquired absence of left leg above knee: Secondary | ICD-10-CM | POA: Diagnosis not present

## 2022-11-26 DIAGNOSIS — N189 Chronic kidney disease, unspecified: Secondary | ICD-10-CM | POA: Diagnosis not present

## 2022-11-26 DIAGNOSIS — I951 Orthostatic hypotension: Secondary | ICD-10-CM | POA: Diagnosis not present

## 2022-11-26 DIAGNOSIS — D500 Iron deficiency anemia secondary to blood loss (chronic): Secondary | ICD-10-CM | POA: Diagnosis not present

## 2022-11-26 DIAGNOSIS — E1151 Type 2 diabetes mellitus with diabetic peripheral angiopathy without gangrene: Secondary | ICD-10-CM | POA: Diagnosis present

## 2022-11-26 DIAGNOSIS — E872 Acidosis, unspecified: Secondary | ICD-10-CM | POA: Diagnosis present

## 2022-11-26 DIAGNOSIS — Z7901 Long term (current) use of anticoagulants: Secondary | ICD-10-CM

## 2022-11-26 DIAGNOSIS — I130 Hypertensive heart and chronic kidney disease with heart failure and stage 1 through stage 4 chronic kidney disease, or unspecified chronic kidney disease: Secondary | ICD-10-CM | POA: Diagnosis present

## 2022-11-26 DIAGNOSIS — K219 Gastro-esophageal reflux disease without esophagitis: Secondary | ICD-10-CM | POA: Diagnosis present

## 2022-11-26 DIAGNOSIS — F32A Depression, unspecified: Secondary | ICD-10-CM | POA: Diagnosis present

## 2022-11-26 DIAGNOSIS — Z681 Body mass index (BMI) 19 or less, adult: Secondary | ICD-10-CM | POA: Diagnosis not present

## 2022-11-26 DIAGNOSIS — J449 Chronic obstructive pulmonary disease, unspecified: Secondary | ICD-10-CM | POA: Diagnosis present

## 2022-11-26 DIAGNOSIS — R54 Age-related physical debility: Secondary | ICD-10-CM | POA: Diagnosis present

## 2022-11-26 DIAGNOSIS — I480 Paroxysmal atrial fibrillation: Secondary | ICD-10-CM | POA: Diagnosis present

## 2022-11-26 DIAGNOSIS — E1122 Type 2 diabetes mellitus with diabetic chronic kidney disease: Secondary | ICD-10-CM | POA: Diagnosis present

## 2022-11-26 DIAGNOSIS — I77810 Thoracic aortic ectasia: Secondary | ICD-10-CM | POA: Diagnosis present

## 2022-11-26 DIAGNOSIS — N179 Acute kidney failure, unspecified: Secondary | ICD-10-CM | POA: Diagnosis present

## 2022-11-26 DIAGNOSIS — I509 Heart failure, unspecified: Secondary | ICD-10-CM | POA: Diagnosis not present

## 2022-11-26 DIAGNOSIS — I350 Nonrheumatic aortic (valve) stenosis: Secondary | ICD-10-CM | POA: Diagnosis present

## 2022-11-26 DIAGNOSIS — D62 Acute posthemorrhagic anemia: Secondary | ICD-10-CM | POA: Diagnosis present

## 2022-11-26 DIAGNOSIS — I959 Hypotension, unspecified: Secondary | ICD-10-CM | POA: Diagnosis present

## 2022-11-26 DIAGNOSIS — E785 Hyperlipidemia, unspecified: Secondary | ICD-10-CM | POA: Diagnosis present

## 2022-11-26 DIAGNOSIS — Z8249 Family history of ischemic heart disease and other diseases of the circulatory system: Secondary | ICD-10-CM

## 2022-11-26 DIAGNOSIS — E119 Type 2 diabetes mellitus without complications: Secondary | ICD-10-CM | POA: Diagnosis not present

## 2022-11-26 DIAGNOSIS — E43 Unspecified severe protein-calorie malnutrition: Secondary | ICD-10-CM | POA: Diagnosis present

## 2022-11-26 DIAGNOSIS — Z5320 Procedure and treatment not carried out because of patient's decision for unspecified reasons: Secondary | ICD-10-CM | POA: Diagnosis not present

## 2022-11-26 DIAGNOSIS — K921 Melena: Secondary | ICD-10-CM | POA: Diagnosis not present

## 2022-11-26 DIAGNOSIS — I10 Essential (primary) hypertension: Secondary | ICD-10-CM | POA: Diagnosis present

## 2022-11-26 DIAGNOSIS — I2510 Atherosclerotic heart disease of native coronary artery without angina pectoris: Secondary | ICD-10-CM | POA: Diagnosis present

## 2022-11-26 DIAGNOSIS — R911 Solitary pulmonary nodule: Secondary | ICD-10-CM | POA: Diagnosis present

## 2022-11-26 DIAGNOSIS — K254 Chronic or unspecified gastric ulcer with hemorrhage: Principal | ICD-10-CM | POA: Diagnosis present

## 2022-11-26 DIAGNOSIS — I5020 Unspecified systolic (congestive) heart failure: Secondary | ICD-10-CM | POA: Diagnosis present

## 2022-11-26 DIAGNOSIS — I255 Ischemic cardiomyopathy: Secondary | ICD-10-CM | POA: Diagnosis present

## 2022-11-26 DIAGNOSIS — E0590 Thyrotoxicosis, unspecified without thyrotoxic crisis or storm: Secondary | ICD-10-CM | POA: Diagnosis present

## 2022-11-26 DIAGNOSIS — Z79899 Other long term (current) drug therapy: Secondary | ICD-10-CM

## 2022-11-26 DIAGNOSIS — K922 Gastrointestinal hemorrhage, unspecified: Secondary | ICD-10-CM

## 2022-11-26 DIAGNOSIS — R079 Chest pain, unspecified: Secondary | ICD-10-CM | POA: Diagnosis present

## 2022-11-26 DIAGNOSIS — N1831 Chronic kidney disease, stage 3a: Secondary | ICD-10-CM | POA: Diagnosis present

## 2022-11-26 DIAGNOSIS — I5022 Chronic systolic (congestive) heart failure: Secondary | ICD-10-CM | POA: Diagnosis present

## 2022-11-26 DIAGNOSIS — D6832 Hemorrhagic disorder due to extrinsic circulating anticoagulants: Secondary | ICD-10-CM | POA: Diagnosis present

## 2022-11-26 DIAGNOSIS — F17210 Nicotine dependence, cigarettes, uncomplicated: Secondary | ICD-10-CM | POA: Diagnosis present

## 2022-11-26 DIAGNOSIS — L8990 Pressure ulcer of unspecified site, unspecified stage: Secondary | ICD-10-CM | POA: Insufficient documentation

## 2022-11-26 DIAGNOSIS — I252 Old myocardial infarction: Secondary | ICD-10-CM

## 2022-11-26 DIAGNOSIS — Z720 Tobacco use: Secondary | ICD-10-CM | POA: Diagnosis present

## 2022-11-26 DIAGNOSIS — Z951 Presence of aortocoronary bypass graft: Secondary | ICD-10-CM

## 2022-11-26 DIAGNOSIS — T45515A Adverse effect of anticoagulants, initial encounter: Secondary | ICD-10-CM | POA: Diagnosis present

## 2022-11-26 DIAGNOSIS — Z634 Disappearance and death of family member: Secondary | ICD-10-CM

## 2022-11-26 LAB — COMPREHENSIVE METABOLIC PANEL
ALT: 11 U/L (ref 0–44)
AST: 18 U/L (ref 15–41)
Albumin: 3 g/dL — ABNORMAL LOW (ref 3.5–5.0)
Alkaline Phosphatase: 86 U/L (ref 38–126)
Anion gap: 11 (ref 5–15)
BUN: 32 mg/dL — ABNORMAL HIGH (ref 8–23)
CO2: 17 mmol/L — ABNORMAL LOW (ref 22–32)
Calcium: 8.4 mg/dL — ABNORMAL LOW (ref 8.9–10.3)
Chloride: 106 mmol/L (ref 98–111)
Creatinine, Ser: 1.68 mg/dL — ABNORMAL HIGH (ref 0.61–1.24)
GFR, Estimated: 41 mL/min — ABNORMAL LOW (ref >60)
Glucose, Bld: 148 mg/dL — ABNORMAL HIGH (ref 70–99)
Potassium: 4.9 mmol/L (ref 3.5–5.1)
Sodium: 134 mmol/L — ABNORMAL LOW (ref 135–145)
Total Bilirubin: 0.7 mg/dL (ref 0.3–1.2)
Total Protein: 5.8 g/dL — ABNORMAL LOW (ref 6.5–8.1)

## 2022-11-26 LAB — LACTIC ACID, PLASMA
Lactic Acid, Venous: 2.9 mmol/L (ref 0.5–1.9)
Lactic Acid, Venous: 1.4 mmol/L (ref 0.5–1.9)

## 2022-11-26 LAB — CBC WITH DIFFERENTIAL/PLATELET
Abs Immature Granulocytes: 0.11 10*3/uL — ABNORMAL HIGH (ref 0.00–0.07)
Basophils Absolute: 0.1 10*3/uL (ref 0.0–0.1)
Basophils Relative: 1 %
Eosinophils Absolute: 0.1 10*3/uL (ref 0.0–0.5)
Eosinophils Relative: 1 %
HCT: 19.3 % — ABNORMAL LOW (ref 39.0–52.0)
Hemoglobin: 5.4 g/dL — ABNORMAL LOW (ref 13.0–17.0)
Immature Granulocytes: 1 %
Lymphocytes Relative: 12 %
Lymphs Abs: 1.8 10*3/uL (ref 0.7–4.0)
MCH: 26.7 pg (ref 26.0–34.0)
MCHC: 28 g/dL — ABNORMAL LOW (ref 30.0–36.0)
MCV: 95.5 fL (ref 80.0–100.0)
Monocytes Absolute: 1.2 10*3/uL — ABNORMAL HIGH (ref 0.1–1.0)
Monocytes Relative: 8 %
Neutro Abs: 11.2 10*3/uL — ABNORMAL HIGH (ref 1.7–7.7)
Neutrophils Relative %: 77 %
Platelets: 295 10*3/uL (ref 150–400)
RBC: 2.02 MIL/uL — ABNORMAL LOW (ref 4.22–5.81)
RDW: 19.5 % — ABNORMAL HIGH (ref 11.5–15.5)
WBC: 14.5 10*3/uL — ABNORMAL HIGH (ref 4.0–10.5)
nRBC: 0.1 % (ref 0.0–0.2)

## 2022-11-26 LAB — TROPONIN I (HIGH SENSITIVITY)
Troponin I (High Sensitivity): 10 ng/L (ref <18)
Troponin I (High Sensitivity): 10 ng/L (ref <18)

## 2022-11-26 LAB — PROTIME-INR
INR: 1.6 — ABNORMAL HIGH (ref 0.8–1.2)
Prothrombin Time: 19.2 seconds — ABNORMAL HIGH (ref 11.4–15.2)

## 2022-11-26 LAB — BRAIN NATRIURETIC PEPTIDE: B Natriuretic Peptide: 172.2 pg/mL — ABNORMAL HIGH (ref 0.0–100.0)

## 2022-11-26 LAB — PREPARE RBC (CROSSMATCH)

## 2022-11-26 MED ORDER — ALBUTEROL SULFATE HFA 108 (90 BASE) MCG/ACT IN AERS: 2.0000 | INHALATION_SPRAY | Freq: Four times a day (QID) | RESPIRATORY_TRACT | Status: DC | PRN

## 2022-11-26 MED ORDER — IOHEXOL 350 MG/ML SOLN
80.0000 mL | Freq: Once | INTRAVENOUS | Status: AC | PRN
  Administered 2022-11-26: 80 mL via INTRAVENOUS

## 2022-11-26 MED ORDER — PANTOPRAZOLE INFUSION (NEW) - SIMPLE MED
8.0000 mg/h | INTRAVENOUS | Status: DC
  Administered 2022-11-26 – 2022-11-27 (×3): 8 mg/h via INTRAVENOUS
  Filled 2022-11-26 (×3): qty 100

## 2022-11-26 MED ORDER — ONDANSETRON HCL 4 MG/2ML IJ SOLN: 4.0000 mg | Freq: Four times a day (QID) | INTRAMUSCULAR | Status: DC | PRN

## 2022-11-26 MED ORDER — SODIUM CHLORIDE 0.9 % IV SOLN
10.0000 mL/h | Freq: Once | INTRAVENOUS | Status: AC
  Administered 2022-11-26: 10 mL/h via INTRAVENOUS

## 2022-11-26 MED ORDER — ACETAMINOPHEN 650 MG RE SUPP: 650.0000 mg | Freq: Four times a day (QID) | RECTAL | Status: DC | PRN

## 2022-11-26 MED ORDER — MELATONIN 5 MG PO TABS
5.0000 mg | ORAL_TABLET | Freq: Every day | ORAL | Status: DC
  Administered 2022-11-27 – 2022-11-28 (×3): 5 mg via ORAL
  Filled 2022-11-26 (×3): qty 1

## 2022-11-26 MED ORDER — ONDANSETRON HCL 4 MG PO TABS: 4.0000 mg | ORAL_TABLET | Freq: Four times a day (QID) | ORAL | Status: DC | PRN

## 2022-11-26 MED ORDER — NICOTINE 21 MG/24HR TD PT24: 21.0000 mg | MEDICATED_PATCH | Freq: Every day | TRANSDERMAL | Status: DC | PRN

## 2022-11-26 MED ORDER — SODIUM CHLORIDE 0.9 % IV SOLN
10.0000 mL/h | Freq: Once | INTRAVENOUS | Status: AC
  Administered 2022-11-27: 10 mL/h via INTRAVENOUS

## 2022-11-26 MED ORDER — ACETAMINOPHEN 325 MG PO TABS: 650.0000 mg | ORAL_TABLET | Freq: Four times a day (QID) | ORAL | Status: DC | PRN

## 2022-11-26 MED ORDER — PANTOPRAZOLE 80MG IVPB - SIMPLE MED
80.0000 mg | Freq: Once | INTRAVENOUS | Status: AC
  Administered 2022-11-26: 80 mg via INTRAVENOUS
  Filled 2022-11-26: qty 100

## 2022-11-26 MED ORDER — SENNOSIDES-DOCUSATE SODIUM 8.6-50 MG PO TABS: 1.0000 | ORAL_TABLET | Freq: Every evening | ORAL | Status: DC | PRN

## 2022-11-26 MED ORDER — MONTELUKAST SODIUM 10 MG PO TABS
10.0000 mg | ORAL_TABLET | Freq: Every day | ORAL | Status: DC
  Administered 2022-11-27 – 2022-11-28 (×3): 10 mg via ORAL
  Filled 2022-11-26 (×3): qty 1

## 2022-11-26 MED ORDER — ALBUTEROL SULFATE (2.5 MG/3ML) 0.083% IN NEBU: 2.5000 mg | INHALATION_SOLUTION | Freq: Four times a day (QID) | RESPIRATORY_TRACT | Status: DC | PRN

## 2022-11-26 MED ORDER — SODIUM CHLORIDE 0.9 % IV BOLUS: Freq: Once | INTRAVENOUS | Status: AC

## 2022-11-26 MED ORDER — PANTOPRAZOLE SODIUM 40 MG IV SOLR: 40.0000 mg | Freq: Two times a day (BID) | INTRAVENOUS | Status: DC

## 2022-11-26 NOTE — Assessment & Plan Note (Signed)
Creatinine 1.68 on presentation and down to 1.15 with fluids.

## 2022-11-26 NOTE — Assessment & Plan Note (Deleted)
Acute blood loss anemia Symptomatic anemia Acute on chronic severe anemia - Staff message sent for GI consultation to Dr. Vicente Males - Clear liquids; n.p.o. after midnight - Continue Protonix bolus and GGT - EDP ordered 1 unit of PRBC for emergent transfusion and 1 unit of PRBC for transfusion post type and cross - Ordered additional 1 unit of PRBC for transfusion - Nursing: Ensure to PIV in setting of upper GI bleed (preferred large-bore) - Scheduled recheck for hemoglobin and hematocrit on 11/27/2022 at approximately 3 AM, discussed with cross coverage provider - Admit to stepdown, inpatient

## 2022-11-26 NOTE — Assessment & Plan Note (Signed)
-   As needed nicotine patch ordered ?

## 2022-11-26 NOTE — H&P (Incomplete)
History and Physical   Shawn Jennings RKY:[id number] DOB: [birth year] DOA: 11/26/2022  PCP: Virgie Dad, FNP  Patient coming [facility]: [facility], via EMS  I have personally briefly reviewed patient's old medical records in [place].  Chief Concern: Chest pain, bloody bowel movement  HPI: Shawn Jennings is a 77 year old male with history of GI bleed, metabolic acidosis, leukocytosis, CKD 3A, CAD, ischemic cardiomyopathy, atrial fibrillation on Eliquis, non-insulin-dependent diabetes mellitus, who presents emergency department for chief concerns of substernal chest pain and bloody stool for 2 weeks.  Initial vitals in the emergency department showed temperature of 97.5, respiration rate of 16, heart rate of 81, blood pressure 91/78, SpO2 of 99% on 5 L nasal cannula.  Serum sodium is 134, potassium 4.9, chloride 106, bicarb 17, BUN of 32, serum creatinine of 1.68, nonfasting blood glucose 148, EGFR 41, WBC 14.5, hemoglobin 5.4, platelets of 295.  Lactic acid was 2.9.  BNP was 172.2.  High sensitive troponin was 10.  ED treatment: 1 unit of emergent packed red blood cells were ordered for administration.  Patient was typed and screened for an additional 1 unit. ----------------------- At bedside,   Social history: ***  Vaccination history: ***  (The initial 2-3 lines should be focused and good to copy and paste in the HPI section of the daily progress note).   (For level 3, the HPI must include 4+ descriptors: Location, Quality, Severity, Duration, Timing, Context, modifying factors, associated signs/symptoms and/or status of 3+ chronic problems.)   ROS:*** Constitutional: no weight change, no fever ENT/Mouth: no sore throat, no rhinorrhea Eyes: no eye pain, no vision changes Cardiovascular: no chest pain, no dyspnea,  no edema, no palpitations Respiratory: no cough, no sputum, no wheezing Gastrointestinal: no nausea, no vomiting, no diarrhea, no  constipation Genitourinary: no urinary incontinence, no dysuria, no hematuria Musculoskeletal: no arthralgias, no myalgias Skin: no skin lesions, no pruritus, Neuro: + weakness, no loss of consciousness, no syncope Psych: no anxiety, no depression, + decrease appetite Heme/Lymph: no bruising, no bleeding  ED Course: Discussed with emergency medicine provider, patient requiring hospitalization for chief concerns of upper GI bleed.  Assessment/Plan  Principal Problem:   Upper GI bleed Active Problems:   Essential hypertension   HFrEF (heart failure with reduced ejection fraction) (HCC)   Stage 3a chronic kidney disease (CKD) (HCC)   Ischemic cardiomyopathy   Protein-calorie malnutrition, severe   PAF (paroxysmal atrial fibrillation) (HCC)   GERD (gastroesophageal reflux disease)   Tobacco abuse   Hyperlipidemia   Hypotension   AKI (acute kidney injury) ([place])   Metabolic acidosis   Assessment and Plan:  * Upper GI bleed Acute blood loss anemia Symptomatic anemia Acute on chronic severe anemia - Staff message sent for GI consultation to Dr. Vicente Males - Clear liquids; n.p.o. after midnight - Continue Protonix bolus and GGT - EDP ordered 1 unit of PRBC for emergent transfusion and 1 unit of PRBC for transfusion post type and cross - Ordered additional 1 unit of PRBC for transfusion - Nursing: Ensure to PIV in setting of upper GI bleed (preferred large-bore) - Scheduled recheck for hemoglobin and hematocrit on 11/27/2022 at approximately 3 AM, discussed with cross coverage provider - Admit to stepdown, inpatient  Essential hypertension - Home antihypertensive medications not resumed on admission due to hypotension on admission - AM team to resume home sacubitril-valsartan, spironolactone, isosorbide mononitrate, furosemide as appropriate  AKI (acute kidney injury) ([place]) - Presumed secondary to prerenal in setting of GI loss - BMP  in the a.m.  Tobacco abuse - As needed nicotine  patch ordered  PAF (paroxysmal atrial fibrillation) (HCC) - Holding home apixaban 5 mg p.o. twice daily in setting of acute on chronic anemia presumed secondary to upper GI bleed      *** Chart reviewed.   Hospitalizations from 1/10/204 to 11/14/2022: Patient was discharged to assisted living facility.  Patient was admitted for acute blood loss anemia in setting of anticoagulation for paroxysmal atrial fibrillation.  Patient received a total of 4 units PRBC to maintain hemoglobin greater than 8.  Anticoagulation was held at that time.  GI was consulted patient underwent endoscopy shows nonbleeding gastric ulcer.  Patient refused preparation for colonoscopy, as such colonoscopy was rescheduled twice.  Patient refused NG tube for prep fluid.  RBC tagged scan was done which was negative for active GI bleeding.  Patient poured prep fluid into the trash can and colonoscopy was canceled.  DVT prophylaxis: TED hose Code Status: *** Diet: Clear liquids; n.p.o. after midnight Family Communication: No Disposition Plan: Pending clinical course Consults called: Gastroenterology via epic order and staff message Admission status: Stepdown, inpatient  Past Medical History:  Diagnosis Date   Acquired dilation of ascending aorta and aortic root ([place])    a. 06/2022 Echo: Asc Ao 62m.   Aortic stenosis    a. Pt unaware of history but CT imaging 01/2019 consistent w/ AVR; b. 01/2019 Echo: Mild to mod AS. Mean grad 114mg; c. 06/2022 Echo: Nl fxn'ing prosth AoV. Asc Ao 4069m  CAD (coronary artery disease)    [place]. [place] Cath: Sev 3VD, patent grafts->Med Rx; c. 06/2022 NSTEMI/PCI: LM diff dzs, LAD 100m91m 40, LCX 100p/m, OM3 mod dzs, RCA 100p, LIMA->LAD ok, RIMA->RPDA 40ost, VG->D2 30p/95p (3.0x22 Onyx Frontier DES)--< L Radial-OM3 20ost.   Chronic HFrEF (heart failure with reduced ejection fraction) (HCC)[place] a. 01/2019 Echo: EF 40-45%; b. 04/2020 Echo: EF 25-30%; c. 09/2020 Echo: EF  50-55%; d. 07/2021 Echo: EF 20-25%; e. 06/2022 Echo: EF 20-25%, glob HK, mild LVH, GrII DD, nl RV fxn, nl fxn'ing bioprosth AoV, Asc Ao 40mm50mCKD (chronic kidney disease), stage III (HCC)    COPD (chronic obstructive pulmonary disease) (HCC) [place]    Essential hypertension    GERD (gastroesophageal reflux disease)    Hyperlipidemia LDL goal <70    Ischemic cardiomyopathy    a. 01/2019 Echo: EF 40-45%; b. 04/2020 Echo: EF 25-30%; c. 09/2020 Echo: EF 50-55%; d. 07/2021 Echo: EF 20-25%; e. 06/2022 Echo: EF 20-25%.   PAD (peripheral artery disease) (HCC) 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   PAF (paroxysmal atrial fibrillation) (HCC)    a. CHA2DS2VASc = 4-->Xarelto '15mg'$  daily in setting of CKD.   Thyrotoxicosis    Tobacco abuse    Type II diabetes mellitus (HCC[place]:  Procedure Laterality Date   CARDIAC CATHETERIZATION     CHOLECYSTECTOMY     COLONOSCOPY     COLONOSCOPY WITH PROPOFOL N/A 07/20/2021   Procedure: COLONOSCOPY WITH PROPOFOL;  Surgeon: VangaLin Landsman  [facility]  Service: Gastroenterology;  Laterality: N/A;   COLONOSCOPY WITH PROPOFOL N/A 11/06/2022   Procedure: COLONOSCOPY WITH PROPOFOL;  Surgeon: Wohl,[name]  [facility];  Service: Endoscopy;  Laterality: N/A;   CORONARY ARTERY BYPASS GRAFT     a. 1994 [place]A 07/18/2022  Procedure: CORONARY STENT INTERVENTION;  Surgeon: Nelva Bush, MD;  Location: [place] CV LAB;  Service: Cardiovascular;  Laterality: N/A;   ENDOSCOPIC RETROGRADE CHOLANGIOPANCREATOGRAPHY (ERCP) WITH PROPOFOL N/A 10/19/2020   Procedure: ENDOSCOPIC RETROGRADE CHOLANGIOPANCREATOGRAPHY (ERCP) WITH PROPOFOL;  Surgeon: [name], MD;  Location: [facility] ENDOSCOPY;  Service: Endoscopy;  Laterality: N/A;   ERCP N/A 12/29/2020   Procedure: ENDOSCOPIC RETROGRADE CHOLANGIOPANCREATOGRAPHY (ERCP);  Surgeon: [name], MD;  Location: [facility] ENDOSCOPY;  Service:  Endoscopy;  Laterality: N/A;   ESOPHAGOGASTRODUODENOSCOPY (EGD) WITH PROPOFOL N/A 07/18/2021   Procedure: ESOPHAGOGASTRODUODENOSCOPY (EGD) WITH PROPOFOL;  Surgeon: Lin Landsman, MD;  Location: [facility] ENDOSCOPY;  Service: Gastroenterology;  Laterality: N/A;   ESOPHAGOGASTRODUODENOSCOPY (EGD) WITH PROPOFOL N/A 11/06/2022   Procedure: ESOPHAGOGASTRODUODENOSCOPY (EGD) WITH PROPOFOL;  Surgeon: [name], MD;  Location: [facility] ENDOSCOPY;  Service: Endoscopy;  Laterality: N/A;   Left AKA     RIGHT HEART CATH AND CORONARY/GRAFT ANGIOGRAPHY N/A 07/18/2022   Procedure: RIGHT HEART CATH AND CORONARY/GRAFT ANGIOGRAPHY;  Surgeon: Nelva Bush, MD;  Location: [place] CV LAB;  Service: Cardiovascular;  Laterality: N/A;   RIGHT/LEFT HEART CATH AND CORONARY ANGIOGRAPHY N/A 06/06/2020   Procedure: RIGHT/LEFT HEART CATH AND CORONARY ANGIOGRAPHY;  Surgeon: Wellington Hampshire, MD;  Location: [place] CV LAB;  Service: Cardiovascular;  Laterality: N/A;   Social History:  reports that he has been smoking cigarettes. He has a 62.00 pack-year smoking history. He has never used smokeless tobacco. He reports that he does not drink alcohol and does not use drugs.  No Known Allergies Family History  Problem Relation Age of Onset   Heart attack Mother        died @ 34.   Other Father        never knew his father.   Other Sister        overdose of sleeping pills.   Heart disease Brother        died in his 30's   Other Sister        complication of abd surgery @ 10   CAD Sister    Family history: Family history reviewed and not pertinent.  Prior to Admission medications   Medication Sig Start Date End Date Taking? Authorizing Provider  albuterol (VENTOLIN HFA) 108 (90 Base) MCG/ACT inhaler Inhale 2 puffs into the lungs every 6 (six) hours as needed for wheezing or shortness of breath.     [provider]  apixaban (ELIQUIS) 5 MG TABS tablet Take 1 tablet (5 mg total) by mouth 2 (two) times  daily. 07/22/21   Loletha Grayer, MD  dapagliflozin propanediol (FARXIGA) 10 MG TABS tablet Take 1 tablet (10 mg total) by mouth daily. 11/23/21   Alisa Graff, FNP  DULoxetine (CYMBALTA) 30 MG capsule Take 30 mg by mouth 2 (two) times daily.    [provider]  ferrous sulfate 325 (65 FE) MG tablet Take 325 mg by mouth every other day.    [provider]  furosemide (LASIX) 20 MG tablet Take 1 tablet (20 mg total) by mouth daily. 11/10/21   Jennye Boroughs, MD  isosorbide mononitrate (IMDUR) 30 MG 24 hr tablet Take 1 tablet (30 mg total) by mouth daily. 03/11/20 05/27/29  Sharen Hones, MD  lamoTRIgine (LAMICTAL) 25 MG tablet Take 25 mg by mouth 2 (two) times daily.    [provider]  Melatonin 5 MG CAPS Take 5 mg by mouth at bedtime.    [provider]  montelukast (SINGULAIR) 10 MG tablet Take  10 mg by mouth at bedtime.    [provider]  Multiple Vitamins-Minerals (PRESERVISION AREDS) CAPS Take 1 capsule by mouth in the morning and at bedtime.    [provider]  nitroGLYCERIN (NITROSTAT) 0.4 MG SL tablet Place 0.4 mg under the tongue every 5 (five) minutes as needed for chest pain. Give one tablet sublingually q5 minutes x 3 doses PRN for chest pain relief    [provider]  pantoprazole (PROTONIX) 40 MG tablet Take 1 tablet (40 mg total) by mouth 2 (two) times daily before a meal. 11/13/22   Shawna Clamp, MD  sacubitril-valsartan (ENTRESTO) 24-26 MG Take 1 tablet by mouth 2 (two) times daily.    [provider]  Semaglutide, 1 MG/DOSE, (OZEMPIC, 1 MG/DOSE,) 4 MG/3ML SOPN Inject 1 mg into the skin once a week.    [provider]  spironolactone (ALDACTONE) 25 MG tablet Take 1 tablet (25 mg total) by mouth daily. 06/11/22   Alisa Graff, FNP   Physical Exam: Vitals:   11/26/22 2130 11/26/22 2200 11/26/22 2215 11/26/22 2230  BP: (!) 90/49 (!) 90/51 (!) 96/54 (!) 93/54  Pulse: 76 77 77 81  Resp: '19 14 18 15   '$ Temp:    98.1 F (36.7 C)  TempSrc:      SpO2:  93% 100% 100%  Weight:      Height:       Constitutional: appears ***, NAD, calm, comfortable Eyes: PERRL, lids and conjunctivae normal ENMT: Mucous membranes are moist. Posterior pharynx clear of any exudate or lesions. Age-appropriate dentition. Hearing appropriate/loss*** Neck: normal, supple, no masses, no thyromegaly Respiratory: clear to auscultation bilaterally, no wheezing, no crackles. Normal respiratory effort. No accessory muscle use.  Cardiovascular: Regular rate and rhythm, no murmurs / rubs / gallops. No extremity edema. 2+ pedal pulses. No carotid bruits.  Abdomen: no tenderness, no masses palpated, no hepatosplenomegaly. Bowel sounds positive.  Musculoskeletal: no clubbing / cyanosis. No joint deformity upper and lower extremities. Good ROM, no contractures, no atrophy. Normal muscle tone.  Skin: no rashes, lesions, ulcers. No induration Neurologic: Sensation intact. Strength 5/5 in all 4.  Psychiatric: Normal judgment and insight. Alert and oriented x 3. Normal mood.   EKG: independently reviewed, showing sinus rhythm with rate of 83, QTc 594  Chest x-ray on Admission: I personally reviewed and I agree with radiologist reading as below.  CT ANGIO GI BLEED  Result Date: 11/26/2022 CLINICAL DATA:  Bloody stool. EXAM: CTA ABDOMEN AND PELVIS WITHOUT AND WITH CONTRAST TECHNIQUE: Multidetector CT imaging of the abdomen and pelvis was performed using the standard protocol during bolus administration of intravenous contrast. Multiplanar reconstructed images and MIPs were obtained and reviewed to evaluate the vascular anatomy. RADIATION DOSE REDUCTION: This exam was performed according to the departmental dose-optimization program which includes automated exposure control, adjustment of the mA and/or kV according to patient size and/or use of iterative reconstruction technique. CONTRAST:  34m OMNIPAQUE IOHEXOL 350 MG/ML SOLN  COMPARISON:  10/31/2022 FINDINGS: VASCULAR Aorta: Diffuse aortic atherosclerosis.  No aneurysm. Celiac: Extensive atherosclerotic calcifications, patent. SMA: Extensive atherosclerotic calcifications, patent. Renals: Calcified renal arteries bilaterally.  Patent bilaterally. IMA: Occluded Inflow: Diffuse atherosclerotic calcifications. 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 Right fem distal bypass graft appears patent. Veins: No obvious venous abnormality within the limitations of this arterial phase study. Review of the MIP images confirms the  above findings. NON-VASCULAR Lower chest: No effusions. Coronary artery and aortic calcifications. Linear densities in the lung bases, likely atelectasis. Hepatobiliary: No focal liver abnormality is seen. Status post cholecystectomy. No biliary dilatation. Pancreas: No focal abnormality or ductal dilatation. Spleen: No focal abnormality.  Normal size. Adrenals/Urinary Tract: No adrenal abnormality. No focal renal abnormality. No stones or hydronephrosis. Urinary bladder is unremarkable. Stomach/Bowel: No visible active GI bleed.  No bowel obstruction. Lymphatic: No adenopathy Reproductive: No visible focal abnormality. Other: No free fluid or free air. Musculoskeletal: No acute bony abnormality. IMPRESSION: VASCULAR No visible active GI bleed. Diffuse aortic/arterial atherosclerosis. 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 Right femoral bypass graft is patent. NON-VASCULAR No acute findings in the abdomen or pelvis. Electronically Signed   By: Rolm Baptise M.D.   On: 11/26/2022 22:12   DG Chest Portable 1 View  Result Date: 11/26/2022 CLINICAL DATA:  Chest pain. EXAM: PORTABLE CHEST 1 VIEW COMPARISON:  Chest radiograph dated 10/31/2022. FINDINGS: No focal  consolidation, pleural effusion, or pneumothorax. Mild chronic interstitial coarsening and bronchitic changes. The cardiac silhouette is within normal limits. Median sternotomy wires and CABG vascular clips. Aortic valve repair. Atherosclerotic calcification of the aorta. No acute osseous pathology. IMPRESSION: No active disease. Electronically Signed   By: Anner Crete M.D.   On: 11/26/2022 20:16    Labs on Admission: I have personally reviewed following labs  CBC: Recent Labs  Lab 11/26/22 1929  WBC 14.5*  NEUTROABS 11.2*  HGB 5.4*  HCT 19.3*  MCV 95.5  PLT 373   Basic Metabolic Panel: Recent Labs  Lab 11/26/22 1929  NA 134*  K 4.9  CL 106  CO2 17*  GLUCOSE 148*  BUN 32*  CREATININE 1.68*  CALCIUM 8.4*   GFR: Estimated Creatinine Clearance: 37.7 mL/min (A) (by C-G formula based on SCr of 1.68 mg/dL (H)).  Liver Function Tests: Recent Labs  Lab 11/26/22 1929  AST 18  ALT 11  ALKPHOS 86  BILITOT 0.7  PROT 5.8*  ALBUMIN 3.0*   Coagulation Profile: Recent Labs  Lab 11/26/22 1929  INR 1.6*   Urine analysis:    Component Value Date/Time   COLORURINE STRAW (A) 07/15/2021 1839   APPEARANCEUR CLEAR (A) 07/15/2021 1839   LABSPEC 1.014 07/15/2021 1839   PHURINE 5.0 07/15/2021 1839   GLUCOSEU 150 (A) 07/15/2021 1839   HGBUR NEGATIVE 07/15/2021 1839   BILIRUBINUR NEGATIVE 07/15/2021 1839   KETONESUR NEGATIVE 07/15/2021 1839   PROTEINUR NEGATIVE 07/15/2021 1839   NITRITE NEGATIVE 07/15/2021 1839   LEUKOCYTESUR NEGATIVE 07/15/2021 1839   CRITICAL CARE Performed by: Dr. Tobie Poet  Total critical care time: 35 minutes  Critical care time was exclusive of separately billable procedures and treating other patients.  Critical care was necessary to treat or prevent imminent or life-threatening deterioration.  Critical care was time spent personally by me on the following activities: development of treatment plan with patient and/or surrogate as well as nursing,  discussions with consultants, evaluation of patient's response to treatment, examination of patient, obtaining history from patient or surrogate, ordering and performing treatments and interventions, ordering and review of laboratory studies, ordering and review of radiographic studies, pulse oximetry and re-evaluation of patient's condition.  This document was prepared using Dragon Voice Recognition software and may include unintentional dictation errors.  Dr. Tobie Poet [facility]  If 7PM-7AM, please contact overnight-coverage provider If 7AM-7PM, please contact day coverage provider [email or web address]  11/26/2022, 11:05  PM

## 2022-11-26 NOTE — Assessment & Plan Note (Signed)
Hold Eliquis.  May have to discontinue this medication since patient is refusing colonoscopy.

## 2022-11-26 NOTE — ED Provider Notes (Signed)
a  [facility] Provider Note    Event Date/Time   First MD Initiated Contact with Patient 11/26/22 Doran Heater     (approximate)   History   Chest Pain   HPI  Shawn Jennings is a 77 y.o. male  with h/o afib on Eliquis, DM2, CKD3, ischemic cardiomyopathy, prior GI bleed, here with generalized weakness, chest pressure and BRBPR. Pt reports that for the past 2-3 days he has had grossly bloody bowel movements. He's also had several episodes of hematemesis per his report. He has had no overt abd pain. He's had CP at rest and lightheadedness/dizziness today as well. Reports he feels fatigued. Pt was just admitted end of last month for anemia, abdominal pain and EGD showd nonbleeding ulcer.       Physical Exam   Triage Vital Signs: ED Triage Vitals  Enc Vitals Group     BP      Pulse      Resp      Temp      Temp src      SpO2      Weight      Height      Head Circumference      Peak Flow      Pain Score      Pain Loc      Pain Edu?      Excl. in [place]?     Most recent vital signs: Vitals:   11/27/22 0201 11/27/22 0215  BP: (!) 90/56 (!) 90/57  Pulse: 76 73  Resp: 16 15  Temp: 97.6 F (36.4 C)   SpO2:  96%     General: Awake, no distress. Pale, weak. CV:  Good peripheral perfusion. Normal rate. Resp:  Normal effort. Lungs CTAB. Abd:  No distention. Minimal TTP, no rebound or guarding. Other:  Pale. Grossly bloody stool noted.   ED Results / Procedures / Treatments   Labs (all labs ordered are listed, but only abnormal results are displayed) Labs Reviewed  CBC WITH DIFFERENTIAL/PLATELET - Abnormal; Notable for the following components:      Result Value   WBC 14.5 (*)    RBC 2.02 (*)    Hemoglobin 5.4 (*)    HCT 19.3 (*)    MCHC 28.0 (*)    RDW 19.5 (*)    Neutro Abs 11.2 (*)    Monocytes Absolute 1.2 (*)    Abs Immature Granulocytes 0.11 (*)    All other components within normal limits  COMPREHENSIVE METABOLIC PANEL - Abnormal;  Notable for the following components:   Sodium 134 (*)    CO2 17 (*)    Glucose, Bld 148 (*)    BUN 32 (*)    Creatinine, Ser 1.68 (*)    Calcium 8.4 (*)    Total Protein 5.8 (*)    Albumin 3.0 (*)    GFR, Estimated 41 (*)    All other components within normal limits  BRAIN NATRIURETIC PEPTIDE - Abnormal; Notable for the following components:   B Natriuretic Peptide 172.2 (*)    All other components within normal limits  PROTIME-INR - Abnormal; Notable for the following components:   Prothrombin Time 19.2 (*)    INR 1.6 (*)    All other components within normal limits  LACTIC ACID, PLASMA - Abnormal; Notable for the following components:   Lactic Acid, Venous 2.9 (*)    All other components within normal limits  LACTIC ACID, PLASMA  CBC  BASIC METABOLIC  PANEL  TYPE AND SCREEN  PREPARE RBC (CROSSMATCH)  PREPARE RBC (CROSSMATCH)  TROPONIN I (HIGH SENSITIVITY)  TROPONIN I (HIGH SENSITIVITY)     EKG Normal sinus rhythm, VR 83. PR 155, QRS 86, QTc 594. No acute St elevations. No ischemia or infarct.   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   PROCEDURES:  Critical Care performed: Yes, see critical care procedure note(s)  .Critical Care  Performed by: Duffy Bruce, MD Authorized by: Duffy Bruce, MD   Critical care provider statement:    Critical care time (minutes):  30   Critical care time was exclusive of:  Separately billable procedures and treating other patients   Critical care was necessary to treat or prevent imminent or life-threatening deterioration of the following conditions:  Circulatory failure, cardiac failure and respiratory failure   Critical care was time spent personally by me on the following activities:  Development of treatment plan with patient or  surrogate, discussions with consultants, evaluation of patient's response to treatment, examination of patient, ordering and review of laboratory studies, ordering and review of radiographic studies, ordering and performing treatments and interventions, pulse oximetry, re-evaluation of patient's condition and review of old charts   I assumed direction of critical care for this patient from another provider in my specialty: no     Care discussed with: admitting provider       MEDICATIONS ORDERED IN ED: Medications  pantoprozole (PROTONIX) 80 mg /NS 100 mL infusion (8 mg/hr Intravenous New Bag/Given 11/26/22 2207)  pantoprazole (PROTONIX) injection 40 mg (has no administration in time range)  0.9 %  sodium chloride infusion (has no administration in time range)  acetaminophen (TYLENOL) tablet 650 mg (has no administration in time range)    Or  acetaminophen (TYLENOL) suppository 650 mg (has no administration in time range)  ondansetron (ZOFRAN) tablet 4 mg (has no administration in time range)    Or  ondansetron (ZOFRAN) injection 4 mg (has no administration in time range)  senna-docusate (Senokot-S) tablet 1 tablet (has no administration in time range)  melatonin tablet 5 mg (has no administration in time range)  montelukast (SINGULAIR) tablet 10 mg (has no administration in time range)  albuterol (PROVENTIL) (2.5 MG/3ML) 0.083% nebulizer solution 2.5 mg (has no administration in time range)  nicotine (NICODERM CQ - dosed in mg/24 hours) patch 21 mg (has no administration in time range)  0.9 %  sodium chloride infusion (0 mL/hr Intravenous Stopped 11/26/22 2219)  pantoprazole (PROTONIX) 80 mg /NS 100 mL IVPB (0 mg Intravenous Stopped 11/26/22 2203)  iohexol (OMNIPAQUE) 350 MG/ML injection 80 mL (80 mLs Intravenous Contrast Given 11/26/22 2152)  sodium chloride 0.9 % bolus (0 mLs Intravenous Stopped 11/27/22 0207)     IMPRESSION / MDM / [place] / ED COURSE  I reviewed the triage vital  signs and the nursing notes.                              Differential diagnosis includes, but is not limited to: gastritis, PUD, AVM, diverticulosis, colitis, mesenteric ischemia  Patient's presentation is most consistent with acute presentation with potential threat to life or bodily function.  The patient is on the cardiac monitor to evaluate for evidence of arrhythmia and/or significant heart rate  changes.  77 yo M with extensive PMHx including recent admission for GI bleed here with recurrent GI bleed and symptomatic anemia. Pt hypotensive, pale on arrival with Hgb 5.4 and elevated LA likely from hypoperfusion. Mild elevation in BUN/Cr also noted. Troponin normal. EKG nonischemic. Pt has grossly bloody stools. Reviewed recent H&P and EGD results. Pt did not have a colonoscopy during admission because he reportedly refused the prep. Sx are c/w possible UGIB given his report of melena but LGIB also remians a consideration. Given his relative hypotension, 1u PRBC transfused as emergency release and will order additional 1u PRBC from type and cross. Will admit to medicine.  FINAL CLINICAL IMPRESSION(S) / ED DIAGNOSES   Final diagnoses:  Blood loss anemia  Anticoagulated  Lactic acidosis     Rx / DC Orders   ED Discharge Orders     None        Note:  This document was prepared using Dragon voice recognition software and may include unintentional dictation errors.   Duffy Bruce, MD 11/27/22 Rogene [place]

## 2022-11-26 NOTE — Assessment & Plan Note (Signed)
Holding all antihypertensive medications

## 2022-11-26 NOTE — Hospital Course (Addendum)
Mr. Madix Blowe is a 77 year old male with history of GI bleed, metabolic acidosis, leukocytosis, CKD 3A, CAD, ischemic cardiomyopathy, atrial fibrillation on Eliquis, non-insulin-dependent diabetes mellitus, who presents emergency department for chief concerns of substernal chest pain and bloody stool for 2 weeks.  Initial vitals in the emergency department showed temperature of 97.5, respiration rate of 16, heart rate of 81, blood pressure 91/78, SpO2 of 99% on 5 L nasal cannula.  Serum sodium is 134, potassium 4.9, chloride 106, bicarb 17, BUN of 32, serum creatinine of 1.68, nonfasting blood glucose 148, EGFR 41, WBC 14.5, hemoglobin 5.4, platelets of 295.  Lactic acid was 2.9.  BNP was 172.2.  High sensitive troponin was 10.  Received 3 units of packed red blood cells.  2/6.  Hemoglobin 6.9.  Ordered for another unit of packed red blood cells.  CO2 very low will order sodium bicarb drip after blood.  Replacing IV calcium today.  Continue to hold Eliquis.

## 2022-11-26 NOTE — ED Triage Notes (Signed)
Pt to [place] [facility] from [place] via [place] with c/o substernal chest pain and bloody stool x2weeks. Pt was given 2 SL nitro and 4 baby asa via ems. Pt has lengthy cardiac hx and is on blood thinners.

## 2022-11-27 ENCOUNTER — Other Ambulatory Visit: Payer: Self-pay

## 2022-11-27 ENCOUNTER — Inpatient Hospital Stay: Payer: Medicare Other | Admitting: Anesthesiology

## 2022-11-27 ENCOUNTER — Encounter
Admission: EM | Disposition: A | Discharge status: Skilled Nursing Facility | Payer: Self-pay | Source: Skilled Nursing Facility | Attending: Internal Medicine

## 2022-11-27 DIAGNOSIS — K921 Melena: Secondary | ICD-10-CM

## 2022-11-27 DIAGNOSIS — D500 Iron deficiency anemia secondary to blood loss (chronic): Secondary | ICD-10-CM

## 2022-11-27 DIAGNOSIS — D62 Acute posthemorrhagic anemia: Secondary | ICD-10-CM

## 2022-11-27 DIAGNOSIS — K922 Gastrointestinal hemorrhage, unspecified: Secondary | ICD-10-CM | POA: Diagnosis not present

## 2022-11-27 DIAGNOSIS — E872 Acidosis, unspecified: Secondary | ICD-10-CM

## 2022-11-27 DIAGNOSIS — I951 Orthostatic hypotension: Secondary | ICD-10-CM

## 2022-11-27 DIAGNOSIS — I480 Paroxysmal atrial fibrillation: Secondary | ICD-10-CM

## 2022-11-27 DIAGNOSIS — I5020 Unspecified systolic (congestive) heart failure: Secondary | ICD-10-CM

## 2022-11-27 DIAGNOSIS — N179 Acute kidney failure, unspecified: Secondary | ICD-10-CM

## 2022-11-27 DIAGNOSIS — Z720 Tobacco use: Secondary | ICD-10-CM

## 2022-11-27 DIAGNOSIS — E43 Unspecified severe protein-calorie malnutrition: Secondary | ICD-10-CM

## 2022-11-27 HISTORY — PX: ESOPHAGOGASTRODUODENOSCOPY (EGD) WITH PROPOFOL: SHX5813

## 2022-11-27 LAB — BASIC METABOLIC PANEL
Anion gap: 6 (ref 5–15)
BUN: 23 mg/dL (ref 8–23)
CO2: 15 mmol/L — ABNORMAL LOW (ref 22–32)
Calcium: 6.1 mg/dL — CL (ref 8.9–10.3)
Chloride: 117 mmol/L — ABNORMAL HIGH (ref 98–111)
Creatinine, Ser: 1.15 mg/dL (ref 0.61–1.24)
GFR, Estimated: >60 mL/min (ref >60)
Glucose, Bld: 89 mg/dL (ref 70–99)
Potassium: 3.5 mmol/L (ref 3.5–5.1)
Sodium: 138 mmol/L (ref 135–145)

## 2022-11-27 LAB — CBC
HCT: 22.9 % — ABNORMAL LOW (ref 39.0–52.0)
Hemoglobin: 6.9 g/dL — ABNORMAL LOW (ref 13.0–17.0)
MCH: 26.4 pg (ref 26.0–34.0)
MCHC: 30.1 g/dL (ref 30.0–36.0)
MCV: 87.7 fL (ref 80.0–100.0)
Platelets: 210 10*3/uL (ref 150–400)
RBC: 2.61 MIL/uL — ABNORMAL LOW (ref 4.22–5.81)
RDW: 20.2 % — ABNORMAL HIGH (ref 11.5–15.5)
WBC: 8.5 10*3/uL (ref 4.0–10.5)
nRBC: 0 % (ref 0.0–0.2)

## 2022-11-27 LAB — MRSA NEXT GEN BY PCR, NASAL: MRSA by PCR Next Gen: NOT DETECTED

## 2022-11-27 LAB — HEMOGLOBIN: Hemoglobin: 10.5 g/dL — ABNORMAL LOW (ref 13.0–17.0)

## 2022-11-27 LAB — PREPARE RBC (CROSSMATCH)

## 2022-11-27 SURGERY — ESOPHAGOGASTRODUODENOSCOPY (EGD) WITH PROPOFOL
Anesthesia: General

## 2022-11-27 MED ORDER — SODIUM CHLORIDE 0.9 % IV SOLN: Freq: Once | INTRAVENOUS | Status: AC

## 2022-11-27 MED ORDER — SODIUM CHLORIDE 0.9% IV SOLUTION
Freq: Once | INTRAVENOUS | Status: DC
  Filled 2022-11-27: qty 250

## 2022-11-27 MED ORDER — PROPOFOL 10 MG/ML IV BOLUS
INTRAVENOUS | Status: DC | PRN
  Administered 2022-11-27: 100 mg via INTRAVENOUS
  Administered 2022-11-27: 50 mg via INTRAVENOUS

## 2022-11-27 MED ORDER — PHENYLEPHRINE 80 MCG/ML (10ML) SYRINGE FOR IV PUSH (FOR BLOOD PRESSURE SUPPORT)
PREFILLED_SYRINGE | INTRAVENOUS | Status: DC | PRN
  Administered 2022-11-27: 80 ug via INTRAVENOUS

## 2022-11-27 MED ORDER — LIDOCAINE HCL (CARDIAC) PF 100 MG/5ML IV SOSY
PREFILLED_SYRINGE | INTRAVENOUS | Status: DC | PRN
  Administered 2022-11-27: 50 mg via INTRAVENOUS

## 2022-11-27 MED ORDER — SODIUM CHLORIDE 0.9 % IV BOLUS
500.0000 mL | Freq: Once | INTRAVENOUS | Status: AC
  Administered 2022-11-27: 500 mL via INTRAVENOUS

## 2022-11-27 MED ORDER — CALCIUM GLUCONATE-NACL 1-0.675 GM/50ML-% IV SOLN
1.0000 g | Freq: Once | INTRAVENOUS | Status: AC
  Administered 2022-11-27: 1000 mg via INTRAVENOUS
  Filled 2022-11-27: qty 50

## 2022-11-27 MED ORDER — ACETAMINOPHEN 325 MG PO TABS: 650.0000 mg | ORAL_TABLET | Freq: Once | ORAL | Status: DC

## 2022-11-27 MED ORDER — FUROSEMIDE 10 MG/ML IJ SOLN
40.0000 mg | Freq: Once | INTRAMUSCULAR | Status: DC
  Filled 2022-11-27: qty 4

## 2022-11-27 MED ORDER — SODIUM CHLORIDE 0.45 % IV SOLN
INTRAVENOUS | Status: DC
  Filled 2022-11-27 (×2): qty 75

## 2022-11-27 MED ORDER — SODIUM CHLORIDE 0.9 % IV SOLN
INTRAVENOUS | Status: DC
  Administered 2022-11-27: 1000 mL via INTRAVENOUS

## 2022-11-27 NOTE — Consult Note (Signed)
Shawn Lame, MD [facility]  [address], [place], [place] [address] Phone: [phone] Fax : [phone]  Consultation  Referring Provider:     Dr. Tobie Poet Primary Care Physician:  Virgie Dad, FNP Primary Gastroenterologist:  Dr. Marius Ditch         Reason for Consultation:     Anemia  Date of Admission:  11/26/2022 Date of Consultation:  11/27/2022         HPI:   Shawn Jennings is a 77 y.o. male who was recently discharged from the hospital for bleeding from blood loss.  Patient was discharged at the end of January for this anemia after an EGD did not show a cause for anemia but the patient had multiple attempts by multiple physicians including all the members of my group and the hospitalist taking care of the patient to encourage him to take a prep.  The patient stated multiple times that he would take the prep and then would be found pouring the prep down the drain or refusing to take it.  The patient then refused to have a colonoscopy and the patient was discharged.  The patient was then readmitted yesterday with 2 to 3 days of bloody bowel movements several episodes of hematemesis.  The patient was having chest pain and lightheadedness.  The patient blood count on admission was 5.4 which this morning was 6.9. The patient is not having any symptoms at the present time of abdominal discomfort.  Past Medical History:  Diagnosis Date   Acquired dilation of ascending aorta and aortic root ([place])    a. 06/2022 Echo: Asc Ao 54m.   Aortic stenosis    a. Pt unaware of history but CT imaging 01/2019 consistent w/ AVR; b. 01/2019 Echo: Mild to mod AS. Mean grad 144mg; c. 06/2022 Echo: Nl fxn'ing prosth AoV. Asc Ao 4076m  CAD (coronary artery disease)    a. [place]G [place] 05[place] Cath: Sev 3VD, patent grafts->Med Rx; c. 06/2022 NSTEMI/PCI: LM diff dzs, LAD 100m24m 40, LCX 100p/m, OM3 mod dzs, RCA 100p, LIMA->LAD ok, RIMA->RPDA 40ost, VG->D2 30p/95p (3.0x22 Onyx Frontier DES)--< L  Radial-OM3 20ost.   Chronic HFrEF (heart failure with reduced ejection fraction) (HCC)[place] a. 01/2019 Echo: EF 40-45%; b. 04/2020 Echo: EF 25-30%; c. 09/2020 Echo: EF 50-55%; d. 07/2021 Echo: EF 20-25%; e. 06/2022 Echo: EF 20-25%, glob HK, mild LVH, GrII DD, nl RV fxn, nl fxn'ing bioprosth AoV, Asc Ao 40mm72mCKD (chronic kidney disease), stage III (HCC)    COPD (chronic obstructive pulmonary disease) (HCC) [place]    Essential hypertension    GERD (gastroesophageal reflux disease)    Hyperlipidemia LDL goal <70    Ischemic cardiomyopathy    a. 01/2019 Echo: EF 40-45%; b. 04/2020 Echo: EF 25-30%; c. 09/2020 Echo: EF 50-55%; d. 07/2021 Echo: EF 20-25%; e. 06/2022 Echo: EF 20-25%.   PAD (peripheral artery disease) (HCC) [place]. 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   PAF (paroxysmal atrial fibrillation) (HCC)    a. CHA2DS2VASc = 4-->Xarelto '15mg'$  daily in setting of CKD.   Thyrotoxicosis    Tobacco abuse    Type II diabetes mellitus (HCC) [place] Past Surgical History:  Procedure Laterality Date   CARDIAC CATHETERIZATION     CHOLECYSTECTOMY     COLONOSCOPY     COLONOSCOPY WITH PROPOFOL N/A 07/20/2021   Procedure: COLONOSCOPY WITH PROPOFOL;  Surgeon: VangaLin Landsman  MD;  Location: [facility] ENDOSCOPY;  Service: Gastroenterology;  Laterality: N/A;   COLONOSCOPY WITH PROPOFOL N/A 11/06/2022   Procedure: COLONOSCOPY WITH PROPOFOL;  Surgeon: Shawn Lame, MD;  Location: [facility] ENDOSCOPY;  Service: Endoscopy;  Laterality: N/A;   CORONARY ARTERY BYPASS GRAFT     a. [place], [place]   CORONARY STENT INTERVENTION N/A 07/18/2022   Procedure: CORONARY STENT INTERVENTION;  Surgeon: Nelva Bush, MD;  Location: [place] CV LAB;  Service: Cardiovascular;  Laterality: N/A;   ENDOSCOPIC RETROGRADE CHOLANGIOPANCREATOGRAPHY (ERCP) WITH PROPOFOL N/A 10/19/2020   Procedure: ENDOSCOPIC RETROGRADE CHOLANGIOPANCREATOGRAPHY (ERCP) WITH PROPOFOL;  Surgeon: Shawn Lame, MD;  Location: [facility] ENDOSCOPY;  Service: Endoscopy;  Laterality:  N/A;   ERCP N/A 12/29/2020   Procedure: ENDOSCOPIC RETROGRADE CHOLANGIOPANCREATOGRAPHY (ERCP);  Surgeon: Shawn Lame, MD;  [facility] END[facility]COPY;  Service: Endoscopy;  Laterality: N/A;   ESOPHAGOGASTRODUODENOSCOPY (EGD) WITH PROPOFOL N/A 07/18/2021   Procedure: ESOPHAGOGASTRODUODENOSCOPY (EGD) WITH PROPOFOL;  Surgeon: Lin Landsman, MD;  Location: [facility] ENDOSCOPY;  Service: Gastroenterology;  Laterality: N/A;   ESOPHAGOGASTRODUODENOSCOPY (EGD) WITH PROPOFOL N/A 11/06/2022   Procedure: ESOPHAGOGASTRODUODENOSCOPY (EGD) WITH PROPOFOL;  Surgeon: Shawn Lame, MD;  Location: [facility] ENDOSCOPY;  Service: Endoscopy;  Laterality: N/A;   Left AKA     RIGHT HEART CATH AND CORONARY/GRAFT ANGIOGRAPHY N/A 07/18/2022   Procedure: RIGHT HEART CATH AND CORONARY/GRAFT ANGIOGRAPHY;  Surgeon: Nelva Bush, MD;  Location: [place] CV LAB;  Service: Cardiovascular;  Laterality: N/A;   RIGHT/LEFT HEART CATH AND CORONARY ANGIOGRAPHY N/A 06/06/2020   Procedure: RIGHT/LEFT HEART CATH AND CORONARY ANGIOGRAPHY;  Surgeon: Wellington Hampshire, MD;  Location: [place] CV LAB;  Service: Cardiovascular;  Laterality: N/A;    Prior to Admission medications   Medication Sig Start Date End Date Taking? Authorizing Provider  apixaban (ELIQUIS) 5 MG TABS tablet Take 1 tablet (5 mg total) by mouth 2 (two) times daily. 07/22/21  Yes Wieting, Richard, MD  dapagliflozin propanediol (FARXIGA) 10 MG TABS tablet Take 1 tablet (10 mg total) by mouth daily. 11/23/21  Yes Hackney, Otila Kluver A, FNP  DULoxetine (CYMBALTA) 30 MG capsule Take 30 mg by mouth 2 (two) times daily.   Yes [provider]  ferrous sulfate 325 (65 FE) MG tablet Take 325 mg by mouth every other day.   Yes [provider]  furosemide (LASIX) 20 MG tablet Take 1 tablet (20 mg total) by mouth daily. 11/10/21  Yes Jennye Boroughs, MD  isosorbide mononitrate (IMDUR) 30 MG 24 hr tablet Take 1 tablet (30 mg total) by mouth daily. 03/11/20 05/27/29 Yes Sharen Hones, MD  lamoTRIgine (LAMICTAL) 25 MG tablet Take 25 mg by mouth 2 (two) times daily.   Yes [provider]  Melatonin 5 MG CAPS Take 5 mg by mouth at bedtime.   Yes [provider]  montelukast (SINGULAIR) 10 MG tablet Take 10 mg by mouth at bedtime.   Yes [provider]  Multiple Vitamins-Minerals (PRESERVISION AREDS) CAPS Take 1 capsule by mouth in the morning and at bedtime.   Yes [provider]  nitroGLYCERIN (NITROSTAT) 0.4 MG SL tablet Place 0.4 mg under the tongue every 5 (five) minutes as needed for chest pain. Give one tablet sublingually q5 minutes x 3 doses PRN for chest pain relief   Yes [provider]  pantoprazole (PROTONIX) 40 MG tablet Take 1 tablet (40 mg total) by mouth 2 (two) times daily before a meal. 11/13/22  Yes Shawna Clamp, MD  sacubitril-valsartan (ENTRESTO) 24-26 MG Take 1 tablet  by mouth 2 (two) times daily.   Yes [provider]  Semaglutide, 1 MG/DOSE, (OZEMPIC, 1 MG/DOSE,) 4 MG/3ML SOPN Inject 1 mg into the skin once a week. Use on Thursdays.   Yes [provider]  spironolactone (ALDACTONE) 25 MG tablet Take 1 tablet (25 mg total) by mouth daily. 06/11/22  Yes Darylene Price A, FNP  acetaminophen (TYLENOL) 500 MG tablet Take 500 mg by mouth every 6 (six) hours as needed (pain).    [provider]  albuterol (VENTOLIN HFA) 108 (90 Base) MCG/ACT inhaler Inhale 2 puffs into the lungs every 6 (six) hours as needed for wheezing or shortness of breath.     [provider]  clopidogrel (PLAVIX) 75 MG tablet Take 75 mg by mouth daily. Patient not taking: Reported on 11/27/2022 10/22/22   [provider]    Family History  Problem Relation Age of Onset   Heart attack Mother        died @ 35.   Other Father        never knew his father.   Other Sister        overdose of sleeping pills.   Heart disease Brother        died in his 41's   Other Sister        complication of abd  surgery @ 52   CAD Sister      Social History   Tobacco Use   Smoking status: Every Day    Packs/day: 1.00    Years: 62.00    Total pack years: 62.00    Types: Cigarettes   Smokeless tobacco: Never  Vaping Use   Vaping Use: Never used  Substance Use Topics   Alcohol use: Never   Drug use: Never    Allergies as of 11/26/2022   (No Known Allergies)    Review of Systems:    All systems reviewed and negative except where noted in HPI.   Physical Exam:  Vital signs in last 24 hours: Temp:  [97.3 F (36.3 C)-98.1 F (36.7 C)] 97.6 F (36.4 C) (02/06 0535) Pulse Rate:  [71-81] 76 (02/06 0800) Resp:  [14-28] 23 (02/06 0800) BP: (79-109)/(46-78) 95/57 (02/06 0800) SpO2:  [93 %-100 %] 97 % (02/06 0800) Weight:  [73.5 kg] 73.5 kg (02/05 1939)   General:   Pleasant, cooperative in NAD Head:  Normocephalic and atraumatic. Eyes:   No icterus.   Conjunctiva pale. PERRLA. Ears:  Normal auditory acuity. Neck:  Supple; no masses or thyroidomegaly Lungs: Respirations even and unlabored. Lungs clear to auscultation bilaterally.   No wheezes, crackles, or rhonchi.  Heart:  Regular rate and rhythm;  Without murmur, clicks, rubs or gallops Abdomen:  Soft, nondistended, nontender. Normal bowel sounds. No appreciable masses or hepatomegaly.  No rebound or guarding.  Rectal:  Not performed. Msk:  Symmetrical without gross deformities.    Extremities:  Without edema, cyanosis or clubbing. Neurologic:  Alert and oriented x3;  grossly normal neurologically. Skin:  Intact without significant lesions or rashes. Cervical Nodes:  No significant cervical adenopathy. Psych:  Alert and cooperative. Normal affect.  LAB RESULTS: Recent Labs    11/26/22 1929 11/27/22 0607  WBC 14.5* 8.5  HGB 5.4* 6.9*  HCT 19.3* 22.9*  PLT 295 210   BMET Recent Labs    11/26/22 1929 11/27/22 0607  NA 134* 138  K 4.9 3.5  CL 106 117*  CO2 17* 15*  GLUCOSE 148* 89  BUN 32* 23  CREATININE 1.68* 1.15   CALCIUM 8.4* 6.1*   LFT Recent Labs    11/26/22 1929  PROT 5.8*  ALBUMIN 3.0*  AST 18  ALT 11  ALKPHOS 86  BILITOT 0.7   PT/INR Recent Labs    11/26/22 1929  LABPROT 19.2*  INR 1.6*    STUDIES: CT ANGIO GI BLEED  Result Date: 11/26/2022 CLINICAL DATA:  Bloody stool. EXAM: CTA ABDOMEN AND PELVIS WITHOUT AND WITH CONTRAST TECHNIQUE: Multidetector CT imaging of the abdomen and pelvis was performed using the standard protocol during bolus administration of intravenous contrast. Multiplanar reconstructed images and MIPs were obtained and reviewed to evaluate the vascular anatomy. RADIATION DOSE REDUCTION: This exam was performed according to the departmental dose-optimization program which includes automated exposure control, adjustment of the mA and/or kV according to patient size and/or use of iterative reconstruction technique. CONTRAST:  63m OMNIPAQUE IOHEXOL 350 MG/ML SOLN COMPARISON:  10/31/2022 FINDINGS: VASCULAR Aorta: Diffuse aortic atherosclerosis.  No aneurysm. Celiac: Extensive atherosclerotic calcifications, patent. SMA: Extensive atherosclerotic calcifications, patent. Renals: Calcified renal arteries bilaterally.  Patent bilaterally. IMA: Occluded Inflow: Diffuse atherosclerotic calcifications. 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 Right fem distal bypass graft appears patent. Veins: No obvious venous abnormality within the limitations of this arterial phase study. Review of the MIP images confirms the above findings. NON-VASCULAR Lower chest: No effusions. Coronary artery and aortic calcifications. Linear densities in the lung bases, likely atelectasis. Hepatobiliary: No focal liver abnormality is seen. Status post cholecystectomy. No biliary dilatation. Pancreas: No focal abnormality or ductal dilatation. Spleen: No focal abnormality.  Normal  size. Adrenals/Urinary Tract: No adrenal abnormality. No focal renal abnormality. No stones or hydronephrosis. Urinary bladder is unremarkable. Stomach/Bowel: No visible active GI bleed.  No bowel obstruction. Lymphatic: No adenopathy Reproductive: No visible focal abnormality. Other: No free fluid or free air. Musculoskeletal: No acute bony abnormality. IMPRESSION: VASCULAR No visible active GI bleed. Diffuse aortic/arterial atherosclerosis. 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 Right femoral bypass graft is patent. NON-VASCULAR No acute findings in the abdomen or pelvis. Electronically Signed   By: KRolm BaptiseM.D.   On: 11/26/2022 22:12   DG Chest Portable 1 View  Result Date: 11/26/2022 CLINICAL DATA:  Chest pain. EXAM: PORTABLE CHEST 1 VIEW COMPARISON:  Chest radiograph dated 10/31/2022. FINDINGS: No focal consolidation, pleural effusion, or pneumothorax. Mild chronic interstitial coarsening and bronchitic changes. The cardiac silhouette is within normal limits. Median sternotomy wires and CABG vascular clips. Aortic valve repair. Atherosclerotic calcification of the aorta. No acute osseous pathology. IMPRESSION: No active disease. Electronically Signed   By: AAnner CreteM.D.   On: 11/26/2022 20:16      Impression / Plan:   Assessment: Principal Problem:   GI bleed Active Problems:   Stage 3a chronic kidney disease (CKD) (HCC)   Essential hypertension   Ischemic cardiomyopathy   Protein-calorie malnutrition, severe   PAF (paroxysmal atrial fibrillation) (HCC)   Tobacco abuse   Hyperlipidemia   Hypotension   AKI (acute kidney injury) ([place]   [place]FrEF (heart failure with reduced ejection fraction) ([place]   Metabolic acidosis   DAlok Minshallis a 77y.o. y/o male with a history of anemia with multiple attempts to do a colonoscopy.  The patient had an upper endoscopy at last admission and no source of GI  bleeding  was found.  The patient was questioned about trying to do a colonoscopy this admission but he refuses to take the prep and acknowledges that he will not finish the prep nor does he want to try.  He also refuses to undergo a colonoscopy.  Patient does agree to doing a upper endoscopy.  Plan:  The Patient has profound anemia with symptoms of the anemia.  The patient will be brought up for a upper endoscopy today.  The patient adamantly refuses any lower GI procedures.  He also uses taking a prep for colonoscopy.  The Patient has been explained the plan agrees with it.  Thank you for involving me in the care of this patient.      LOS: 1 day   Shawn Lame, MD, [facility] 11/27/2022, 8:11 AM,  Pager [phone] 7am-5pm  Check AMION for 5pm -7am coverage and on weekends   Note: This dictation was prepared with Dragon dictation along with smaller phrase technology. Any transcriptional errors that result from this process are unintentional.

## 2022-11-27 NOTE — Assessment & Plan Note (Addendum)
Fluid bolus given this morning.  Holding antihypertensive medication.

## 2022-11-27 NOTE — Assessment & Plan Note (Signed)
Patient received 3 units of packed red blood cells, On initial hemoglobin of 5.4.  Today's hemoglobin 6.9.  Will give another unit of packed red blood cells.  Serial hemoglobins.

## 2022-11-27 NOTE — Transfer of Care (Signed)
Immediate Anesthesia Transfer of Care Note  Patient: Shawn Jennings  Procedure(s) Performed: ESOPHAGOGASTRODUODENOSCOPY (EGD) WITH PROPOFOL  Patient Location: Endoscopy Unit  Anesthesia Type:General  Level of Consciousness: drowsy  Airway & Oxygen Therapy: Patient Spontanous Breathing and Patient connected to nasal cannula oxygen  Post-op Assessment: Report given to RN, Post -op Vital signs reviewed and stable, and Patient moving all extremities  Post vital signs: Reviewed and stable  Last Vitals:  Vitals Value Taken Time  BP 93/54 11/27/22 2003  Temp 36.3 C 11/27/22 1959  Pulse 77 11/27/22 2006  Resp    SpO2 99 % 11/27/22 2006  Vitals shown include unvalidated device data.  Last Pain:  Vitals:   11/27/22 1959  TempSrc: Temporal  PainSc: Asleep         Complications: No notable events documented.

## 2022-11-27 NOTE — Anesthesia Procedure Notes (Signed)
Procedure Name: Intubation Date/Time: 11/27/2022 7:44 PM  Performed by: Esaw Grandchild, CRNAPre-anesthesia Checklist: Patient identified, Emergency Drugs available, Suction available and Patient being monitored Patient Re-evaluated:Patient Re-evaluated prior to induction Oxygen Delivery Method: Circle system utilized Preoxygenation: Pre-oxygenation with 100% oxygen Induction Type: IV induction and Rapid sequence Laryngoscope Size: McGraph and 3 Grade View: Grade I Tube type: Oral Tube size: 7.5 mm Number of attempts: 1 Airway Equipment and Method: Stylet, Oral airway and Bite block Placement Confirmation: ETT inserted through vocal cords under direct vision, positive ETCO2 and breath sounds checked- equal and bilateral Secured at: 22 cm Tube secured with: Tape Dental Injury: Teeth and Oropharynx as per pre-operative assessment

## 2022-11-27 NOTE — ED Notes (Signed)
Shawn Jennings with [facility] hosp[facility] [phone]; hoping for a call upon discharge

## 2022-11-27 NOTE — Progress Notes (Signed)
Progress Note   Patient: Shawn Jennings LHT:[id number] DOB: January 25, 1946 DOA: 11/26/2022     1 DOS: the patient was seen and examined on 11/27/2022   Brief hospital course: [name] is a 77 year old male with history of GI bleed, metabolic acidosis, leukocytosis, CKD 3A, CAD, ischemic cardiomyopathy, atrial fibrillation on Eliquis, non-insulin-dependent diabetes mellitus, who presents emergency department for chief concerns of substernal chest pain and bloody stool for 2 weeks.  Initial vitals in the emergency department showed temperature of 97.5, respiration rate of 16, heart rate of 81, blood pressure 91/78, SpO2 of 99% on 5 L nasal cannula.  Serum sodium is 134, potassium 4.9, chloride 106, bicarb 17, BUN of 32, serum creatinine of 1.68, nonfasting blood glucose 148, EGFR 41, WBC 14.5, hemoglobin 5.4, platelets of 295.  Lactic acid was 2.9.  BNP was 172.2.  High sensitive troponin was 10.  Received 3 units of packed red blood cells.  2/6.  Hemoglobin 6.9.  Ordered for another unit of packed red blood cells.  CO2 very low will order sodium bicarb drip after blood.  Replacing IV calcium today.  Continue to hold Eliquis.  Assessment and Plan: * GI bleed For upper endoscopy today.  Hold Eliquis.  On Protonix drip.  Refused colonoscopy.  Received 3 units of packed red blood cells already.  Will give another unit of packed red blood cells today.  Acute blood loss anemia Patient received 3 units of packed red blood cells, On initial hemoglobin of 5.4.  Today's hemoglobin 6.9.  Will give another unit of packed red blood cells.  Serial hemoglobins.  Metabolic acidosis Will start bicarb drip after blood is given.  Hypotension Fluid bolus given this morning.  Holding antihypertensive medication.  Essential hypertension Holding all antihypertensive medications  HFrEF (heart failure with reduced ejection fraction) (HCC) Chronic systolic congestive heart failure with last EF of 20%.   Careful with fluids and blood.  Hypocalcemia Replace IV calcium today  AKI (acute kidney injury) ([place]) Creatinine 1.68 on presentation and down to 1.15 with fluids.  Tobacco abuse - As needed nicotine patch ordered  PAF (paroxysmal atrial fibrillation) (HCC) Hold Eliquis.  May have to discontinue this medication since patient is refusing colonoscopy.  Protein-calorie malnutrition, severe Start supplements once placed on diet.        Subjective: Patient feeling better and offers no complaints.  Blood pressure little on the lower side.  As per nursing staff received 3 units of blood overnight and this morning's hemoglobin 6.9.  Will give another unit of blood.  Patient states his last bowel movement was yesterday at 11 AM and was black.  Patient declined colonoscopy but will have an endoscopy.  Holding Eliquis.  Physical Exam: Vitals:   11/27/22 1155 11/27/22 1210 11/27/22 1215 11/27/22 1230  BP: 108/69 109/65 116/75 117/63  Pulse: 72 72 71 64  Resp: 20 18    Temp: (!) 97.5 F (36.4 C) (!) 97.4 F (36.3 C)    TempSrc: Axillary Axillary    SpO2: 100% 100% 100% 100%  Weight:      Height:       Physical Exam HENT:     Head: Normocephalic.     Mouth/Throat:     Pharynx: No oropharyngeal exudate.  Eyes:     General: Lids are normal.     Conjunctiva/sclera: Conjunctivae normal.  Cardiovascular:     Rate and Rhythm: Normal rate and regular rhythm.     Heart sounds: Normal heart sounds, S1 normal and  S2 normal.  Pulmonary:     Breath sounds: No decreased breath sounds, wheezing, rhonchi or rales.  Abdominal:     Palpations: Abdomen is soft.     Tenderness: There is no abdominal tenderness.  Musculoskeletal:     Right ankle: No swelling.     Left Lower Extremity: Left leg is amputated below knee.  Skin:    General: Skin is warm.     Findings: No rash.  Neurological:     Mental Status: He is alert.     Data Reviewed: Hemoglobin 6.9, creatinine 1.15, calcium 6.1,  CO2 15  Family Communication: Updated primary contact  Disposition: Status is: Inpatient Remains inpatient appropriate because: For endoscopy today.  Refused colonoscopy.  Serial hemoglobins.  Transfusing 1 unit of packed red blood cells.  Planned Discharge Destination: Home    Time spent: 28 minutes  Author: Loletha Grayer, MD 11/27/2022 1:01 PM  For on call review [email or web address].

## 2022-11-27 NOTE — Assessment & Plan Note (Addendum)
For upper endoscopy today.  Hold Eliquis.  On Protonix drip.  Refused colonoscopy.  Received 3 units of packed red blood cells already.  Will give another unit of packed red blood cells today.

## 2022-11-27 NOTE — Assessment & Plan Note (Signed)
Start supplements once placed on diet.

## 2022-11-27 NOTE — Assessment & Plan Note (Signed)
Will start bicarb drip after blood is given.

## 2022-11-27 NOTE — Anesthesia Preprocedure Evaluation (Addendum)
Anesthesia Evaluation  Patient identified by MRN, date of birth, ID band Patient awake    Reviewed: Allergy & Precautions, NPO status , Patient's Chart, lab work & pertinent test results  History of Anesthesia Complications Negative for: history of anesthetic complications  Airway Mallampati: III  TM Distance: >3 FB Neck ROM: Full    Dental  (+) Upper Dentures, Edentulous Lower   Pulmonary neg sleep apnea, COPD, Current Smoker and Patient abstained from smoking.   Pulmonary exam normal breath sounds clear to auscultation       Cardiovascular Exercise Tolerance: Poor METShypertension, + CAD, + Past MI, + Cardiac Stents, + Peripheral Vascular Disease and +CHF  (-) dysrhythmias + Valvular Problems/Murmurs  Rhythm:Regular Rate:Normal - Systolic murmurs Last stent 06/2022  TTE 2023:  1. Left ventricular ejection fraction, by estimation, is 20 to 25%. The  left ventricle has severely decreased function. The left ventricle  demonstrates global hypokinesis. There is mild left ventricular  hypertrophy. Left ventricular diastolic parameters   are consistent with Grade II diastolic dysfunction (pseudonormalization).   2. Right ventricular systolic function is normal. The right ventricular  size is not well visualized.   3. The mitral valve is degenerative. No evidence of mitral valve  regurgitation.   4. The aortic valve has been repaired/replaced. Aortic valve  regurgitation is not visualized. There is a bioprosthetic valve present in  the aortic position.   5. Aortic dilatation noted. There is mild dilatation of the ascending  aorta, measuring 40 mm.   6. The inferior vena cava is normal in size with greater than 50%  respiratory variability, suggesting right atrial pressure of 3 mmHg.     Neuro/Psych  PSYCHIATRIC DISORDERS  Depression    negative neurological ROS     GI/Hepatic PUD,GERD  ,,(+)     (-) substance abuse     Endo/Other  diabetes  Ozempic last taken 3 days ago.  Renal/GU CRFRenal disease     Musculoskeletal   Abdominal   Peds  Hematology   Anesthesia Other Findings Past Medical History: No date: Acquired dilation of ascending aorta and aortic root ([place])     Comment:  a. 06/2022 Echo: Asc Ao 58m. No date: Aortic stenosis     Comment:  a. Pt unaware of history but CT imaging 01/2019               consistent w/ AVR; b. 01/2019 Echo: Mild to mod AS. Mean               grad 169mg; c. 06/2022 Echo: Nl fxn'ing prosth AoV. Asc               Ao 4058mNo date: CAD (coronary artery disease)     Comment:  [place]: Sev 3VD,               patent grafts->Med Rx; c. 06/2022 NSTEMI/PCI: LM diff dzs,              LAD 100m67m 40, LCX 100p/m, OM3 mod dzs, RCA 100p,               LIMA->LAD ok, RIMA->RPDA 40ost, VG->D2 30p/95p (3.0x22               Onyx Frontier DES)--< L Radial-OM3 20ost. No date: Chronic HFrEF (heart failure with reduced ejection fraction)  (HCC)[place]  Comment:  a. 01/2019 Echo: EF 40-45%; b. 04/2020 Echo: EF 25-30%; c.  09/2020 Echo: EF 50-55%; d. 07/2021 Echo: EF 20-25%; e.               06/2022 Echo: EF 20-25%, glob HK, mild LVH, GrII DD, nl RV              fxn, nl fxn'ing bioprosth AoV, Asc Ao 11m. No date: CKD (chronic kidney disease), stage III (HCC) No date: COPD (chronic obstructive pulmonary disease) (HCC) No date: Depression No date: Essential hypertension No date: GERD (gastroesophageal reflux disease) No date: Hyperlipidemia LDL goal <70 No date: Ischemic cardiomyopathy     Comment:  a. 01/2019 Echo: EF 40-45%; b. 04/2020 Echo: EF 25-30%; c.              09/2020 Echo: EF 50-55%; d. 07/2021 Echo: EF 20-25%; e.               06/2022 Echo: EF 20-25%. No date: PAD (peripheral artery disease) ([place]     Comment:  a. 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 No date: PAF (paroxysmal atrial fibrillation) (HCC)     Comment:  a. CHA2DS2VASc = 4-->Xarelto '15mg'$   daily in setting of               CKD. No date: Thyrotoxicosis No date: Tobacco abuse No date: Type II diabetes mellitus (HCC)  Reproductive/Obstetrics                             Anesthesia Physical Anesthesia Plan  ASA: 3  Anesthesia Plan: General   Post-op Pain Management:    Induction: Intravenous and Rapid sequence  PONV Risk Score and Plan: 2 and Ondansetron and Dexamethasone  Airway Management Planned: Oral ETT and Video Laryngoscope Planned  Additional Equipment: None  Intra-op Plan:   Post-operative Plan: Extubation in OR  Informed Consent: I have reviewed the patients History and Physical, chart, labs and discussed the procedure including the risks, benefits and alternatives for the proposed anesthesia with the patient or authorized representative who has indicated his/her understanding and acceptance.     Dental advisory given  Plan Discussed with: CRNA and Surgeon  Anesthesia Plan Comments: (Patient currently takes a GLP-1 agonist medication. Recent guidelines from [employer] [employer] recommend for cessation of weekly injectable medication for one week prior to elective procedure, and of daily medication for one day prior to elective procedure. If these guidelines have not been adhered to, a risk/benefit discussion should be had, and full stomach precautions should be assumed. I spoke with the patient and proceduralist about the r/b/a of proceeding with elective surgeries in these instances, and both understand and accept the risks of proceeding with full stomach precautions. Discussed risks of anesthesia with patient, including PONV, sore throat, lip/dental/eye damage. Rare risks discussed as well, such as cardiorespiratory and neurological sequelae, and allergic reactions. Discussed the role of CRNA in patient's perioperative care. Patient understands.)        Anesthesia Quick Evaluation

## 2022-11-27 NOTE — Assessment & Plan Note (Signed)
Chronic systolic congestive heart failure with last EF of 20%.  Careful with fluids and blood.

## 2022-11-27 NOTE — Op Note (Signed)
[facility] Patient Name: Shawn Jennings Procedure Date: 11/27/2022 7:33 PM MRN: [id number] Account #: [id number] Date of Birth: 1946/07/02 Admit Type: Outpatient Age: 77 Room: [facility] ENDO ROOM 1 Gender: Male Note Status: Finalized Instrument Name: Upper Endoscope [id number] Procedure:             Upper GI endoscopy Indications:           Melena Providers:             [name] MD, MD Medicines:             General Anesthesia Complications:         No immediate complications. Procedure:             Pre-Anesthesia Assessment:                        - Prior to the procedure, a History and Physical was                         performed, and patient medications and allergies were                         reviewed. The patient's tolerance of previous                         anesthesia was also reviewed. The risks and benefits                         of the procedure and the sedation options and risks                         were discussed with the patient. All questions were                         answered, and informed consent was obtained. Prior                         Anticoagulants: The patient has taken no anticoagulant                         or antiplatelet agents. ASA Grade Assessment: III - A                         patient with severe systemic disease. After reviewing                         the risks and benefits, the patient was deemed in                         satisfactory condition to undergo the procedure.                        After obtaining informed consent, the endoscope was                         passed under direct vision. Throughout the procedure,                         the patient's blood pressure, pulse,  and oxygen                         saturations were monitored continuously. The Endoscope                         was introduced through the mouth, and advanced to the                         second part of duodenum. The upper  GI endoscopy was                         accomplished without difficulty. The patient tolerated                         the procedure well. Findings:      A small hiatal hernia was present.      Few non-bleeding superficial gastric ulcers with no stigmata of bleeding       were found in the gastric antrum.      The examined duodenum was normal. Impression:            - Small hiatal hernia.                        - Non-bleeding gastric ulcers with no stigmata of                         bleeding.                        - Normal examined duodenum.                        - No specimens collected. Recommendation:        - Return patient to hospital ward for ongoing care.                        - Resume regular diet.                        - Continue present medications.                        - Patient has refused a colonoscopy prep and having a                         colonoscopy.                        Transfuse as needed. Procedure Code(s):     --- Professional ---                        [phone], Esophagogastroduodenoscopy, flexible,                         transoral; diagnostic, including collection of                         specimen(s) by brushing or washing, when performed                         (  separate procedure) Diagnosis Code(s):     --- Professional ---                        K92.1, Melena (includes Hematochezia)                        K25.9, Gastric ulcer, unspecified as acute or chronic,                         without hemorrhage or perforation CPT copyright 2022 [employer]. All rights reserved. The codes documented in this report are preliminary and upon coder review may  be revised to meet current compliance requirements. [name] MD, MD 11/27/2022 7:52:40 PM This report has been signed electronically. Number of Addenda: 0 Note Initiated On: 11/27/2022 7:33 PM Estimated Blood Loss:  Estimated blood loss: none.      [facility]

## 2022-11-27 NOTE — Assessment & Plan Note (Signed)
Replace IV calcium today

## 2022-11-28 ENCOUNTER — Encounter: Payer: Self-pay | Admitting: Gastroenterology

## 2022-11-28 DIAGNOSIS — K922 Gastrointestinal hemorrhage, unspecified: Secondary | ICD-10-CM | POA: Diagnosis not present

## 2022-11-28 LAB — TYPE AND SCREEN
ABO/RH(D): B POS
Antibody Screen: NEGATIVE
Unit division: 0
Unit division: 0
Unit division: 0
Unit division: 0

## 2022-11-28 LAB — CBC
HCT: 32.8 % — ABNORMAL LOW (ref 39.0–52.0)
Hemoglobin: 10.2 g/dL — ABNORMAL LOW (ref 13.0–17.0)
MCH: 26.8 pg (ref 26.0–34.0)
MCHC: 31.1 g/dL (ref 30.0–36.0)
MCV: 86.3 fL (ref 80.0–100.0)
Platelets: 208 10*3/uL (ref 150–400)
RBC: 3.8 MIL/uL — ABNORMAL LOW (ref 4.22–5.81)
RDW: 19.9 % — ABNORMAL HIGH (ref 11.5–15.5)
WBC: 10.3 10*3/uL (ref 4.0–10.5)
nRBC: 0 % (ref 0.0–0.2)

## 2022-11-28 LAB — BPAM RBC
Blood Product Expiration Date: 202403052359
Blood Product Expiration Date: 202403052359
Blood Product Expiration Date: 202403062359
Blood Product Expiration Date: 202403082359
ISSUE DATE / TIME: 202402051946
ISSUE DATE / TIME: 202402052316
ISSUE DATE / TIME: 202402060312
ISSUE DATE / TIME: 202402060901
Unit Type and Rh: 5100
Unit Type and Rh: 7300
Unit Type and Rh: 7300
Unit Type and Rh: 7300

## 2022-11-28 LAB — BASIC METABOLIC PANEL
Anion gap: 4 — ABNORMAL LOW (ref 5–15)
BUN: 28 mg/dL — ABNORMAL HIGH (ref 8–23)
CO2: 20 mmol/L — ABNORMAL LOW (ref 22–32)
Calcium: 7.9 mg/dL — ABNORMAL LOW (ref 8.9–10.3)
Chloride: 115 mmol/L — ABNORMAL HIGH (ref 98–111)
Creatinine, Ser: 1.1 mg/dL (ref 0.61–1.24)
GFR, Estimated: >60 mL/min (ref >60)
Glucose, Bld: 98 mg/dL (ref 70–99)
Potassium: 4.1 mmol/L (ref 3.5–5.1)
Sodium: 139 mmol/L (ref 135–145)

## 2022-11-28 LAB — HEMOGLOBIN AND HEMATOCRIT, BLOOD
HCT: 33.8 % — ABNORMAL LOW (ref 39.0–52.0)
Hemoglobin: 10.4 g/dL — ABNORMAL LOW (ref 13.0–17.0)

## 2022-11-28 MED ORDER — PANTOPRAZOLE SODIUM 40 MG IV SOLR
40.0000 mg | Freq: Two times a day (BID) | INTRAVENOUS | Status: DC
  Administered 2022-11-28 – 2022-11-29 (×3): 40 mg via INTRAVENOUS
  Filled 2022-11-28 (×3): qty 10

## 2022-11-28 MED ORDER — PROSIGHT PO TABS: 1.0000 | ORAL_TABLET | Freq: Every day | ORAL | Status: DC

## 2022-11-28 MED ORDER — GLUCERNA SHAKE PO LIQD
237.0000 mL | Freq: Three times a day (TID) | ORAL | Status: DC
  Administered 2022-11-29: 237 mL via ORAL

## 2022-11-28 MED ORDER — DULOXETINE HCL 30 MG PO CPEP
30.0000 mg | ORAL_CAPSULE | Freq: Two times a day (BID) | ORAL | Status: DC
  Administered 2022-11-28 – 2022-11-29 (×3): 30 mg via ORAL
  Filled 2022-11-28 (×3): qty 1

## 2022-11-28 MED ORDER — LAMOTRIGINE 25 MG PO TABS
25.0000 mg | ORAL_TABLET | Freq: Two times a day (BID) | ORAL | Status: DC
  Administered 2022-11-28 – 2022-11-29 (×3): 25 mg via ORAL
  Filled 2022-11-28 (×3): qty 1

## 2022-11-28 MED ORDER — VITAMIN C 500 MG PO TABS
500.0000 mg | ORAL_TABLET | Freq: Two times a day (BID) | ORAL | Status: DC
  Administered 2022-11-28 – 2022-11-29 (×2): 500 mg via ORAL
  Filled 2022-11-28 (×2): qty 1

## 2022-11-28 MED ORDER — PROSIGHT PO TABS
1.0000 | ORAL_TABLET | Freq: Every day | ORAL | Status: DC
  Administered 2022-11-28 – 2022-11-29 (×2): 1 via ORAL
  Filled 2022-11-28 (×2): qty 1

## 2022-11-28 NOTE — Progress Notes (Signed)
PROGRESS NOTE    Shawn Jennings  VQQ:[id number] DOB: 07/12/1946 DOA: 11/26/2022 PCP: Virgie Dad, FNP    Brief Narrative:  Mr. Aurthur Wingerter is a 77 year old male with history of GI bleed, metabolic acidosis, leukocytosis, CKD 3A, CAD, ischemic cardiomyopathy, atrial fibrillation on Eliquis, non-insulin-dependent diabetes mellitus, who presents emergency department for chief concerns of substernal chest pain and bloody stool for 2 weeks.   Initial vitals in the emergency department showed temperature of 97.5, respiration rate of 16, heart rate of 81, blood pressure 91/78, SpO2 of 99% on 5 L nasal cannula.   Serum sodium is 134, potassium 4.9, chloride 106, bicarb 17, BUN of 32, serum creatinine of 1.68, nonfasting blood glucose 148, EGFR 41, WBC 14.5, hemoglobin 5.4, platelets of 295.   Lactic acid was 2.9.  BNP was 172.2.  High sensitive troponin was 10.   Received 3 units of packed red blood cells.   2/6.  Hemoglobin 6.9.  Ordered for another unit of packed red blood cells.  CO2 very low will order sodium bicarb drip after blood.  Replacing IV calcium today.  Continue to hold Eliquis.  2/7: Hemoglobin relatively stable.  No obvious bleeding noted overnight.  Endoscopy unrevealing.  Patient was refusing colonoscopy as he did not want to go through prep.  Seems to be reconsidering the decision today.   Assessment & Plan:   Principal Problem:   GI bleed Active Problems:   Acute blood loss anemia   Hypotension   Metabolic acidosis   Essential hypertension   HFrEF (heart failure with reduced ejection fraction) (HCC)   Ischemic cardiomyopathy   Protein-calorie malnutrition, severe   PAF (paroxysmal atrial fibrillation) (HCC)   Tobacco abuse   Hyperlipidemia   AKI (acute kidney injury) ([place])   Hypocalcemia  GI bleed Status post upper endoscopy.  Unrevealing for acute bleeding source.  Hemoglobin stable postprocedure.  Remains on Protonix gtt.  After endoscopy had told  gastroenterologist that he is refusing colonoscopy.  He has already received 3 packed units RBC.  Received a fourth on 2/6.  Hemoglobin stable.  Currently no indication for transfusion. Plan: Check p.m. hemoglobin Repeat hemoglobin in a.m. Continue holding Eliquis If hemoglobin stable consider discharge 2/8.  Suggest holding Eliquis on discharge with follow-up with cardiology or primary care physician to discuss further risk/benefit of reintroducing this medication.  Acute blood loss anemia Patient has received total of 4 units PRBC.  Hemoglobin stable.  No indication for transfusion at this time.  Continue to monitor hemoglobin and for any other obvious bleeding.    Metabolic acidosis Acidosis.  Resolving.  Will discontinue bicarbonate drip   Hypotension Holding home antihypertensives   Essential hypertension Holding all antihypertensive medications   HFrEF (heart failure with reduced ejection fraction) (HCC) Chronic systolic congestive heart failure with last EF of 20%.  Careful with fluids and blood.   Hypocalcemia Replaced with IV calcium gluconate   AKI (acute kidney injury) ([place]) Creatinine 1.68 on presentation and down to 1.15 with fluids.   Tobacco abuse Nicotine patch discontinued per patient request   PAF (paroxysmal atrial fibrillation) (HCC) Hold Eliquis.  May have to discontinue this medication since patient is refusing colonoscopy.   Protein-calorie malnutrition, severe Start supplements once placed on diet.   DVT prophylaxis: SCD Code Status: Full Family Communication: None today Disposition Plan: Status is: Inpatient Remains inpatient appropriate because: GI bleed.  Acute blood loss anemia.  Hemoglobin relatively stable.  She continues to be stable anticipate discharge 2/8.   Level  of care: Med-Surg  Consultants:  GI, signed off  Procedures:  EGD 2/6  Antimicrobials: None   Subjective: Examined.  Appears comfortable and  stable.  Objective: Vitals:   11/28/22 1000 11/28/22 1015 11/28/22 1059 11/28/22 1105  BP: 116/68  117/63   Pulse:  82 77   Resp: '20 17 15   '$ Temp:   97.6 F (36.4 C)   TempSrc:   Oral   SpO2: 92% 100% 91% 98%  Weight:   55.4 kg   Height:   6' (1.829 m)     Intake/Output Summary (Last 24 hours) at 11/28/2022 1407 Last data filed at 11/28/2022 0820 Gross per 24 hour  Intake 407.74 ml  Output 0 ml  Net 407.74 ml   Filed Weights   11/26/22 1939 11/28/22 1059  Weight: 73.5 kg 55.4 kg    Examination:  General exam: Appears calm and comfortable  Respiratory system: Clear to auscultation. Respiratory effort normal. Cardiovascular system: S1-S2, RRR, no murmurs, no pedal edema Gastrointestinal system: Soft, NT/ND, normal bowel sounds Central nervous system: Alert, oriented x 3, no focal deficits Extremities: Symmetric 5 x 5 power. Skin: No rashes, lesions or ulcers Psychiatry: Judgement and insight appear normal. Mood & affect appropriate.     Data Reviewed: I have personally reviewed following labs and imaging studies  CBC: Recent Labs  Lab 11/26/22 1929 11/27/22 0607 11/27/22 1644 11/28/22 0423  WBC 14.5* 8.5  --  10.3  NEUTROABS 11.2*  --   --   --   HGB 5.4* 6.9* 10.5* 10.2*  HCT 19.3* 22.9*  --  32.8*  MCV 95.5 87.7  --  86.3  PLT 295 210  --  751   Basic Metabolic Panel: Recent Labs  Lab 11/26/22 1929 11/27/22 0607 11/28/22 0423  NA 134* 138 139  K 4.9 3.5 4.1  CL 106 117* 115*  CO2 17* 15* 20*  GLUCOSE 148* 89 98  BUN 32* 23 28*  CREATININE 1.68* 1.15 1.10  CALCIUM 8.4* 6.1* 7.9*   GFR: Estimated Creatinine Clearance: 43.4 mL/min (by C-G formula based on SCr of 1.1 mg/dL). Liver Function Tests: Recent Labs  Lab 11/26/22 1929  AST 18  ALT 11  ALKPHOS 86  BILITOT 0.7  PROT 5.8*  ALBUMIN 3.0*   No results for input(s): "LIPASE", "AMYLASE" in the last 168 hours. No results for input(s): "AMMONIA" in the last 168 hours. Coagulation  Profile: Recent Labs  Lab 11/26/22 1929  INR 1.6*   Cardiac Enzymes: No results for input(s): "CKTOTAL", "CKMB", "CKMBINDEX", "TROPONINI" in the last 168 hours. BNP (last 3 results) No results for input(s): "PROBNP" in the last 8760 hours. HbA1C: No results for input(s): "HGBA1C" in the last 72 hours. CBG: No results for input(s): "GLUCAP" in the last 168 hours. Lipid Profile: No results for input(s): "CHOL", "HDL", "LDLCALC", "TRIG", "CHOLHDL", "LDLDIRECT" in the last 72 hours. Thyroid Function Tests: No results for input(s): "TSH", "T4TOTAL", "FREET4", "T3FREE", "THYROIDAB" in the last 72 hours. Anemia Panel: No results for input(s): "VITAMINB12", "FOLATE", "FERRITIN", "TIBC", "IRON", "RETICCTPCT" in the last 72 hours. Sepsis Labs: Recent Labs  Lab 11/26/22 1956 11/26/22 2305  LATICACIDVEN 2.9* 1.4    Recent Results (from the past 240 hour(s))  MRSA Next Gen by PCR, Nasal     Status: None   Collection Time: 11/27/22 11:34 AM   Specimen: Nasal Mucosa; Nasal Swab  Result Value Ref Range Status   MRSA by PCR Next Gen NOT DETECTED NOT DETECTED Final  Comment: Performed at [facility], [address] [place],  [address]         Radiology Studies: CT ANGIO GI BLEED  Result Date: 11/26/2022 CLINICAL DATA:  Bloody stool. EXAM: CTA ABDOMEN AND PELVIS WITHOUT AND WITH CONTRAST TECHNIQUE: Multidetector CT imaging of the abdomen and pelvis was performed using the standard protocol during bolus administration of intravenous contrast. Multiplanar reconstructed images and MIPs were obtained and reviewed to evaluate the vascular anatomy. RADIATION DOSE REDUCTION: This exam was performed according to the departmental dose-optimization program which includes automated exposure control, adjustment of the mA and/or kV according to patient size and/or use of iterative reconstruction technique. CONTRAST:  2m OMNIPAQUE IOHEXOL 350 MG/ML SOLN COMPARISON:  10/31/2022  FINDINGS: VASCULAR Aorta: Diffuse aortic atherosclerosis.  No aneurysm. Celiac: Extensive atherosclerotic calcifications, patent. SMA: Extensive atherosclerotic calcifications, patent. Renals: Calcified renal arteries bilaterally.  Patent bilaterally. IMA: Occluded Inflow: Diffuse atherosclerotic calcifications. 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 Right fem distal bypass graft appears patent. Veins: No obvious venous abnormality within the limitations of this arterial phase study. Review of the MIP images confirms the above findings. NON-VASCULAR Lower chest: No effusions. Coronary artery and aortic calcifications. Linear densities in the lung bases, likely atelectasis. Hepatobiliary: No focal liver abnormality is seen. Status post cholecystectomy. No biliary dilatation. Pancreas: No focal abnormality or ductal dilatation. Spleen: No focal abnormality.  Normal size. Adrenals/Urinary Tract: No adrenal abnormality. No focal renal abnormality. No stones or hydronephrosis. Urinary bladder is unremarkable. Stomach/Bowel: No visible active GI bleed.  No bowel obstruction. Lymphatic: No adenopathy Reproductive: No visible focal abnormality. Other: No free fluid or free air. Musculoskeletal: No acute bony abnormality. IMPRESSION: VASCULAR No visible active GI bleed. Diffuse aortic/arterial atherosclerosis. 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 Right femoral bypass graft is patent. NON-VASCULAR No acute findings in the abdomen or pelvis. Electronically Signed   By: KRolm BaptiseM.D.   On: 11/26/2022 22:12   DG Chest Portable 1 View  Result Date: 11/26/2022 CLINICAL DATA:  Chest pain. EXAM: PORTABLE CHEST 1 VIEW COMPARISON:  Chest radiograph dated 10/31/2022. FINDINGS: No focal consolidation, pleural effusion, or  pneumothorax. Mild chronic interstitial coarsening and bronchitic changes. The cardiac silhouette is within normal limits. Median sternotomy wires and CABG vascular clips. Aortic valve repair. Atherosclerotic calcification of the aorta. No acute osseous pathology. IMPRESSION: No active disease. Electronically Signed   By: AAnner CreteM.D.   On: 11/26/2022 20:16        Scheduled Meds:  sodium chloride   Intravenous Once   acetaminophen  650 mg Oral Once   vitamin C  500 mg Oral BID   DULoxetine  30 mg Oral BID   feeding supplement (GLUCERNA SHAKE)  237 mL Oral TID BM   lamoTRIgine  25 mg Oral BID   melatonin  5 mg Oral QHS   montelukast  10 mg Oral QHS   multivitamin  1 tablet Oral Q1200   pantoprazole (PROTONIX) IV  40 mg Intravenous Q12H   Continuous Infusions:   LOS: 2 days     SSidney Ace MD [facility]   If 7PM-7AM, please contact night-coverage  11/28/2022, 2:07 PM

## 2022-11-28 NOTE — ED Notes (Signed)
RN went back into room and pt remembered that it was 2024, when RN asked what year it was

## 2022-11-28 NOTE — ED Notes (Signed)
Informed RN bed assigned 

## 2022-11-28 NOTE — ED Notes (Signed)
Pt awake, requesting lights be turned off, wondering what time it is.  Vital signs stable, resting in bed comfortably, denies other needs at this time.

## 2022-11-28 NOTE — Anesthesia Postprocedure Evaluation (Signed)
Anesthesia Post Note  Patient: Shawn Jennings  Procedure(s) Performed: ESOPHAGOGASTRODUODENOSCOPY (EGD) WITH PROPOFOL  Patient location during evaluation: PACU Anesthesia Type: General Level of consciousness: awake and alert Pain management: pain level controlled Vital Signs Assessment: post-procedure vital signs reviewed and stable Respiratory status: spontaneous breathing, nonlabored ventilation, respiratory function stable and patient connected to nasal cannula oxygen Cardiovascular status: blood pressure returned to baseline and stable Postop Assessment: no apparent nausea or vomiting Anesthetic complications: no   No notable events documented.   Last Vitals:  Vitals:   11/28/22 0330 11/28/22 0400  BP: 113/60 102/63  Pulse: 67 70  Resp: 19 17  Temp:    SpO2: 100% 99%    Last Pain:  Vitals:   11/27/22 2310  TempSrc:   PainSc: Asleep                 Arita Miss

## 2022-11-28 NOTE — ED Notes (Addendum)
Pt states coming in for abdominal pain. Pt states last BM was yesterday.RN woke pt up and he stated it was January 2034 and then 2004. RN informed pt it was 2024. Pt stated that was correct.

## 2022-11-28 NOTE — ED Notes (Signed)
Provider at bedside. RN informed provider of his confusion earlier today, not knowing the year and month. No new orders.  Pt states he does use oxygen at home at 2lpm. Oxygen was at 4lpm. Oxygen titrated down to 2lpm as per MD.

## 2022-11-29 ENCOUNTER — Emergency Department: Payer: Medicare Other

## 2022-11-29 ENCOUNTER — Emergency Department
Admission: EM | Admit: 2022-11-29 | Discharge: 2022-11-30 | Disposition: A | Discharge status: Skilled Nursing Facility | Payer: Medicare Other | Attending: Emergency Medicine | Admitting: Emergency Medicine

## 2022-11-29 DIAGNOSIS — I2510 Atherosclerotic heart disease of native coronary artery without angina pectoris: Secondary | ICD-10-CM | POA: Insufficient documentation

## 2022-11-29 DIAGNOSIS — E119 Type 2 diabetes mellitus without complications: Secondary | ICD-10-CM | POA: Insufficient documentation

## 2022-11-29 DIAGNOSIS — N189 Chronic kidney disease, unspecified: Secondary | ICD-10-CM | POA: Diagnosis not present

## 2022-11-29 DIAGNOSIS — R079 Chest pain, unspecified: Secondary | ICD-10-CM | POA: Diagnosis not present

## 2022-11-29 DIAGNOSIS — I509 Heart failure, unspecified: Secondary | ICD-10-CM | POA: Diagnosis not present

## 2022-11-29 DIAGNOSIS — L8990 Pressure ulcer of unspecified site, unspecified stage: Secondary | ICD-10-CM | POA: Insufficient documentation

## 2022-11-29 DIAGNOSIS — Z7901 Long term (current) use of anticoagulants: Secondary | ICD-10-CM | POA: Insufficient documentation

## 2022-11-29 DIAGNOSIS — K922 Gastrointestinal hemorrhage, unspecified: Secondary | ICD-10-CM | POA: Diagnosis not present

## 2022-11-29 DIAGNOSIS — R911 Solitary pulmonary nodule: Secondary | ICD-10-CM | POA: Insufficient documentation

## 2022-11-29 LAB — BASIC METABOLIC PANEL
Anion gap: 7 (ref 5–15)
BUN: 33 mg/dL — ABNORMAL HIGH (ref 8–23)
CO2: 21 mmol/L — ABNORMAL LOW (ref 22–32)
Calcium: 8.5 mg/dL — ABNORMAL LOW (ref 8.9–10.3)
Chloride: 109 mmol/L (ref 98–111)
Creatinine, Ser: 1.26 mg/dL — ABNORMAL HIGH (ref 0.61–1.24)
GFR, Estimated: 58 mL/min — ABNORMAL LOW (ref >60)
Glucose, Bld: 147 mg/dL — ABNORMAL HIGH (ref 70–99)
Potassium: 4.1 mmol/L (ref 3.5–5.1)
Sodium: 137 mmol/L (ref 135–145)

## 2022-11-29 LAB — CBC WITH DIFFERENTIAL/PLATELET
Abs Immature Granulocytes: 0.06 10*3/uL (ref 0.00–0.07)
Basophils Absolute: 0.1 10*3/uL (ref 0.0–0.1)
Basophils Relative: 0 %
Eosinophils Absolute: 0.5 10*3/uL (ref 0.0–0.5)
Eosinophils Relative: 4 %
HCT: 32.8 % — ABNORMAL LOW (ref 39.0–52.0)
Hemoglobin: 9.9 g/dL — ABNORMAL LOW (ref 13.0–17.0)
Immature Granulocytes: 1 %
Lymphocytes Relative: 10 %
Lymphs Abs: 1.2 10*3/uL (ref 0.7–4.0)
MCH: 27.1 pg (ref 26.0–34.0)
MCHC: 30.2 g/dL (ref 30.0–36.0)
MCV: 89.9 fL (ref 80.0–100.0)
Monocytes Absolute: 0.8 10*3/uL (ref 0.1–1.0)
Monocytes Relative: 6 %
Neutro Abs: 9.2 10*3/uL — ABNORMAL HIGH (ref 1.7–7.7)
Neutrophils Relative %: 79 %
Platelets: 219 10*3/uL (ref 150–400)
RBC: 3.65 MIL/uL — ABNORMAL LOW (ref 4.22–5.81)
RDW: 19.7 % — ABNORMAL HIGH (ref 11.5–15.5)
WBC: 11.7 10*3/uL — ABNORMAL HIGH (ref 4.0–10.5)
nRBC: 0 % (ref 0.0–0.2)

## 2022-11-29 LAB — TROPONIN I (HIGH SENSITIVITY)
Troponin I (High Sensitivity): 11 ng/L (ref <18)
Troponin I (High Sensitivity): 11 ng/L (ref <18)

## 2022-11-29 LAB — MAGNESIUM: Magnesium: 2 mg/dL (ref 1.7–2.4)

## 2022-11-29 MED ORDER — MORPHINE SULFATE (PF) 4 MG/ML IV SOLN
4.0000 mg | Freq: Once | INTRAVENOUS | Status: AC
  Administered 2022-11-29: 4 mg via INTRAVENOUS
  Filled 2022-11-29: qty 1

## 2022-11-29 MED ORDER — OXYCODONE-ACETAMINOPHEN 5-325 MG PO TABS
1.0000 | ORAL_TABLET | Freq: Once | ORAL | Status: AC
  Administered 2022-11-29: 1 via ORAL
  Filled 2022-11-29: qty 1

## 2022-11-29 NOTE — Discharge Summary (Signed)
Physician Discharge Summary  Nidal Rivet XLK:[id number] DOB: [birth year] DOA: 11/26/2022  PCP: Virgie Dad, FNP  Admit date: 11/26/2022 Discharge date: 11/29/2022  Admitted From: ALF Disposition:  ALF  Recommendations for Outpatient Follow-up:  Follow up with PCP in 1-2 weeks Follow up with GI prn  Home Health:NO Equipment/Devices:None   Discharge Condition:Stable  CODE STATUS:FULL  Diet recommendation: Heart healthy  Brief/Interim Summary: Mr. Donterius Filley is a 77 year old male with history of GI bleed, metabolic acidosis, leukocytosis, CKD 3A, CAD, ischemic cardiomyopathy, atrial fibrillation on Eliquis, non-insulin-dependent diabetes mellitus, who presents emergency department for chief concerns of substernal chest pain and bloody stool for 2 weeks.   Initial vitals in the emergency department showed temperature of 97.5, respiration rate of 16, heart rate of 81, blood pressure 91/78, SpO2 of 99% on 5 L nasal cannula.   Serum sodium is 134, potassium 4.9, chloride 106, bicarb 17, BUN of 32, serum creatinine of 1.68, nonfasting blood glucose 148, EGFR 41, WBC 14.5, hemoglobin 5.4, platelets of 295.   Lactic acid was 2.9.  BNP was 172.2.  High sensitive troponin was 10.   Received 3 units of packed red blood cells.   2/6.  Hemoglobin 6.9.  Ordered for another unit of packed red blood cells.  CO2 very low will order sodium bicarb drip after blood.  Replacing IV calcium today.  Continue to hold Eliquis.   2/7: Hemoglobin relatively stable.  No obvious bleeding noted overnight.  Endoscopy unrevealing.  Patient was refusing colonoscopy as he did not want to go through prep.  Seems to be reconsidering the decision today.  2/8: Hb stable.  Refuses c-scope.  Appropriate for dc at this time.  Considering as yet unknown bleeding source will recommend patient hold Eliquis at time of discharge.  Recommend follow-up with PCP and cardiology within 1 to 2 weeks of discharge to discuss further  risk/benefit of restarting this medication.  Also recommend outpatient referral to gastroenterology for consideration of repeat colonoscopy attempt if patient is willing to undergo prep    Discharge Diagnoses:  Principal Problem:   GI bleed Active Problems:   Acute blood loss anemia   Hypotension   Metabolic acidosis   Essential hypertension   HFrEF (heart failure with reduced ejection fraction) (HCC)   Ischemic cardiomyopathy   Protein-calorie malnutrition, severe   PAF (paroxysmal atrial fibrillation) (HCC)   Tobacco abuse   Hyperlipidemia   AKI (acute kidney injury) ([place])   Hypocalcemia   Lung nodule   Pressure injury of skin GI bleed Status post upper endoscopy.  Unrevealing for acute bleeding source.  Hemoglobin stable postprocedure.  Remains on Protonix gtt.  After endoscopy had told gastroenterologist that he is refusing colonoscopy.  He has already received 3 packed units RBC.  Received a fourth on 2/6.  Hemoglobin stable.  Currently no indication for transfusion. Plan: Hemoglobin has remained stable over the last 24 hours of admission.  Patient refusing colonoscopy/prep.  GI signed off.  As such patient is appropriate for discharge at this time.  Discussed with patient, will recommend patient hold Eliquis at time of DC.  He is aware that this will put him at a slightly increased risk of stroke in the setting paroxysmal atrial fibrillation.  He is recommended to follow-up with his primary care physician as well as cardiology at time of discharge discussed further risk/benefit of restarting this medication at a later date.  Also recommend patient request referral to gastroenterology for consideration of outpatient colonoscopy should he be willing  to undergo prep.  Discharge Instructions  Discharge Instructions     Ambulatory Referral for Lung Cancer Scre   Complete by: As directed    Diet - low sodium heart healthy   Complete by: As directed    Increase activity slowly    Complete by: As directed    No wound care   Complete by: As directed       Allergies as of 11/29/2022   No Known Allergies      Medication List     STOP taking these medications    apixaban 5 MG Tabs tablet Commonly known as: ELIQUIS       TAKE these medications    acetaminophen 500 MG tablet Commonly known as: TYLENOL Take 500 mg by mouth every 6 (six) hours as needed (pain).   dapagliflozin propanediol 10 MG Tabs tablet Commonly known as: FARXIGA Take 1 tablet (10 mg total) by mouth daily.   DULoxetine 30 MG capsule Commonly known as: CYMBALTA Take 30 mg by mouth 2 (two) times daily.   Entresto 24-26 MG Generic drug: sacubitril-valsartan Take 1 tablet by mouth 2 (two) times daily.   ferrous sulfate 325 (65 FE) MG tablet Take 325 mg by mouth every other day.   furosemide 20 MG tablet Commonly known as: LASIX Take 1 tablet (20 mg total) by mouth daily.   isosorbide mononitrate 30 MG 24 hr tablet Commonly known as: IMDUR Take 1 tablet (30 mg total) by mouth daily.   lamoTRIgine 25 MG tablet Commonly known as: LAMICTAL Take 25 mg by mouth 2 (two) times daily.   Melatonin 5 MG Caps Take 5 mg by mouth at bedtime.   montelukast 10 MG tablet Commonly known as: SINGULAIR Take 10 mg by mouth at bedtime.   nitroGLYCERIN 0.4 MG SL tablet Commonly known as: NITROSTAT Place 0.4 mg under the tongue every 5 (five) minutes as needed for chest pain. Give one tablet sublingually q5 minutes x 3 doses PRN for chest pain relief   Ozempic (1 MG/DOSE) 4 MG/3ML Sopn Generic drug: Semaglutide (1 MG/DOSE) Inject 1 mg into the skin once a week. Use on Thursdays.   pantoprazole 40 MG tablet Commonly known as: PROTONIX Take 1 tablet (40 mg total) by mouth 2 (two) times daily before a meal.   PreserVision AREDS Caps Take 1 capsule by mouth in the morning and at bedtime.   spironolactone 25 MG tablet Commonly known as: ALDACTONE Take 1 tablet (25 mg total) by mouth  daily.   Ventolin HFA 108 (90 Base) MCG/ACT inhaler Generic drug: albuterol Inhale 2 puffs into the lungs every 6 (six) hours as needed for wheezing or shortness of breath.        Follow-up Information     Virgie Dad, FNP. Schedule an appointment as soon as possible for a visit in 1 week(s).   Specialty: Family Medicine Why: Virgie Dad will see patient at [facility] 12/03/22 time has yet to be determined. Contact information: [address] [place] [address] [phone]         Wellington Hampshire, MD. Schedule an appointment as soon as possible for a visit in 1 week(s).   Specialty: Cardiology Why: 12/06/22 1:55 PM Check in at 1:40 PM Contact information: [place] [address] [phone]                No Known Allergies  Consultations: GI   Procedures/Studies: CT ANGIO GI BLEED  Result Date: 11/26/2022 CLINICAL DATA:  Bloody stool. EXAM: CTA ABDOMEN AND PELVIS WITHOUT AND WITH CONTRAST TECHNIQUE: Multidetector CT imaging of the abdomen and pelvis was performed using the standard protocol during bolus administration of intravenous contrast. Multiplanar reconstructed images and MIPs were obtained and reviewed to evaluate the vascular anatomy. RADIATION DOSE REDUCTION: This exam was performed according to the departmental dose-optimization program which includes automated exposure control, adjustment of the mA and/or kV according to patient size and/or use of iterative reconstruction technique. CONTRAST:  4m OMNIPAQUE IOHEXOL 350 MG/ML SOLN COMPARISON:  10/31/2022 FINDINGS: VASCULAR Aorta: Diffuse aortic atherosclerosis.  No aneurysm. Celiac: Extensive atherosclerotic calcifications, patent. SMA: Extensive atherosclerotic calcifications, patent. Renals: Calcified renal arteries bilaterally.  Patent bilaterally. IMA: Occluded Inflow: Diffuse atherosclerotic calcifications. Occlusion of the left external iliac  artery and common femoral artery. 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 Right fem distal bypass graft appears patent. Veins: No obvious venous abnormality within the limitations of this arterial phase study. Review of the MIP images confirms the above findings. NON-VASCULAR Lower chest: No effusions. Coronary artery and aortic calcifications. Linear densities in the lung bases, likely atelectasis. Hepatobiliary: No focal liver abnormality is seen. Status post cholecystectomy. No biliary dilatation. Pancreas: No focal abnormality or ductal dilatation. Spleen: No focal abnormality.  Normal size. Adrenals/Urinary Tract: No adrenal abnormality. No focal renal abnormality. No stones or hydronephrosis. Urinary bladder is unremarkable. Stomach/Bowel: No visible active GI bleed.  No bowel obstruction. Lymphatic: No adenopathy Reproductive: No visible focal abnormality. Other: No free fluid or free air. Musculoskeletal: No acute bony abnormality. IMPRESSION: VASCULAR No visible active GI bleed. Diffuse aortic/arterial atherosclerosis. 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 Right femoral bypass graft is patent. NON-VASCULAR No acute findings in the abdomen or pelvis. Electronically Signed   By: KRolm BaptiseM.D.   On: 11/26/2022 22:12   DG Chest Portable 1 View  Result Date: 11/26/2022 CLINICAL DATA:  Chest pain. EXAM: PORTABLE CHEST 1 VIEW COMPARISON:  Chest radiograph dated 10/31/2022. FINDINGS: No focal consolidation, pleural effusion, or pneumothorax. Mild chronic interstitial coarsening and bronchitic changes. The cardiac silhouette is within normal limits. Median sternotomy wires and CABG vascular clips. Aortic valve repair. Atherosclerotic calcification of the aorta. No acute osseous pathology. IMPRESSION: No active disease. Electronically Signed   By: AAnner CreteM.D.    On: 11/26/2022 20:16   NM GI Blood Loss  Result Date: 11/11/2022 CLINICAL DATA:  GI bleeding. EXAM: NUCLEAR MEDICINE GASTROINTESTINAL BLEEDING SCAN TECHNIQUE: Sequential abdominal images were obtained following intravenous administration of Tc-936mabeled red blood cells. RADIOPHARMACEUTICALS:  23.5 mCi Tc-9957mrtechnetate in-vitro labeled red cells. COMPARISON:  None Available. FINDINGS: Good blood pool activity evident. There is no evidence for accumulation over time of radiotracer in the abdomen or pelvis to suggest a site of active GI bleeding. IMPRESSION: Negative for active GI bleeding. Electronically Signed   By: EriMisty StanleyD.   On: 11/11/2022 12:10   DG Swallowing Func-Speech Pathology  Result Date: 11/08/2022 [place] Kicks  11/08/2022  9:54 AM Objective Swallowing Evaluation: Type of Study: MBS-Modified Barium Swallow Study  Patient Details Name: DanMaveric DebonoN: [id number] of Birth: 9/1November 18, 1947day's Date: 11/08/2022 Time: SLP Start Time (ACUTE ONLY): 0805 -SLP Stop Time (ACUTE ONLY): 0850 SLP Time Calculation (min) (ACUTE ONLY): 45 min Past Medical History: Past Medical History: Diagnosis Date  Acquired dilation  of ascending aorta and aortic root ([place])   a. 06/2022 Echo: Asc Ao 45m.  Aortic stenosis   a. Pt unaware of history but CT imaging 01/2019 consistent w/ AVR; b. 01/2019 Echo: Mild to mod AS. Mean grad 174mg; c. 06/2022 Echo: Nl fxn'ing prosth AoV. Asc Ao 4031m CAD (coronary artery disease)   a. 1994 [place] CABG [place] 05[place] Cath: Sev 3VD, patent grafts->Med Rx; c. 06/2022 NSTEMI/PCI: LM diff dzs, LAD 100m82m 40, LCX 100p/m, OM3 mod dzs, RCA 100p, LIMA->LAD ok, RIMA->RPDA 40ost, VG->D2 30p/95p (3.0x22 Onyx Frontier DES)--< L Radial-OM3 20ost.  Chronic HFrEF (heart failure with reduced ejection fraction) (HCC)[place]. 01/2019 Echo: EF 40-45%; b. 04/2020 Echo: EF 25-30%; c. 09/2020 Echo: EF 50-55%; d. 07/2021 Echo: EF 20-25%; e. 06/2022 Echo: EF 20-25%, glob HK,  mild LVH, GrII DD, nl RV fxn, nl fxn'ing bioprosth AoV, Asc Ao 40mm25mKD (chronic kidney disease), stage III (HCC)   COPD (chronic obstructive pulmonary disease) (HCC) [place]   Essential hypertension   GERD (gastroesophageal reflux disease)   Hyperlipidemia LDL goal <70   Ischemic cardiomyopathy   a. 01/2019 Echo: EF 40-45%; b. 04/2020 Echo: EF 25-30%; c. 09/2020 Echo: EF 50-55%; d. 07/2021 Echo: EF 20-25%; e. 06/2022 Echo: EF 20-25%.  PAD (peripheral artery disease) (HCC) [place]. 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  PAF (paroxysmal atrial fibrillation) (HCC)   a. CHA2DS2VASc = 4-->Xarelto '15mg'$  daily in setting of CKD.  Thyrotoxicosis   Tobacco abuse   Type II diabetes mellitus (HCC) [place] Surgical History: Past Surgical History: Procedure Laterality Date  CARDIAC CATHETERIZATION    CHOLECYSTECTOMY    COLONOSCOPY    COLONOSCOPY WITH PROPOFOL N/A 07/20/2021  Procedure: COLONOSCOPY WITH PROPOFOL;  Surgeon: VangaLin Landsman  [facility];  Service: Gastroenterology;  Laterality: N/A;  COLONOSCOPY WITH PROPOFOL N/A 11/06/2022  Procedure: COLONOSCOPY WITH PROPOFOL;  Surgeon: Wohl,[name]  [facility];  Service: Endoscopy;  Laterality: N/A;  CORONARY ARTERY BYPASS GRAFT    a. 1994 [place] INTER[place]ION N/A 07/18/2022  Procedure: CORONARY STENT INTERVENTION;  Surgeon: End, Nelva Bush  [facility];  Service: Cardiovascular;  Laterality: N/A;  ENDOSCOPIC RETROGRADE CHOLANGIOPANCREATOGRAPHY (ERCP) WITH PROPOFOL N/A 10/19/2020  Procedure: ENDOSCOPIC RETROGRADE CHOLANGIOPANCREATOGRAPHY (ERCP) WITH PROPOFOL;  Surgeon: Wohl,[name]  [facility];  Service: Endoscopy;  Laterality: N/A;  ERCP N/A 12/29/2020  Procedure: ENDOSCOPIC RETROGRADE CHOLANGIOPANCREATOGRAPHY (ERCP);  Surgeon: Wohl,[name]  [facility];  Service: Endoscopy;  Laterality: N/A;  ESOPHAGOGASTRODUODENOSCOPY (EGD) WITH PROPOFOL N/A 07/18/2021  Procedure:  ESOPHAGOGASTRODUODENOSCOPY (EGD) WITH PROPOFOL;  Surgeon: VangaLin Landsman  [facility];  Service: Gastroenterology;  Laterality: N/A;  ESOPHAGOGASTRODUODENOSCOPY (EGD) WITH PROPOFOL N/A 11/06/2022  Procedure: ESOPHAGOGASTRODUODENOSCOPY (EGD) WITH PROPOFOL;  Surgeon: Wohl,[name]  [facility]: [facility];  Service: Endoscopy;  Laterality: N/A;  Left AKA    RIGHT HEART CATH AND CORONARY/GRAFT ANGIOGRAPHY N/A 07/18/2022  Procedure: RIGHT HEART CATH AND CORONARY/GRAFT ANGIOGRAPHY;  Surgeon: End, Nelva Bush  [facility];  Service: Cardiovascular;  Laterality: N/A;  RIGHT/LEFT HEART CATH AND CORONARY ANGIOGRAPHY N/A 06/06/2020  Procedure: RIGHT/LEFT HEART CATH AND CORONARY ANGIOGRAPHY;  Surgeon: AridaWellington Hampshire  [facility];  Service: Cardiovascular;  Laterality: N/A; HPI: Pt is a 76 y.71 male  w/ past medical history of CAD, heart failure, CKD, CABG, COPD, oxygen supplementation at night 2L, etoh, depression, hypertension,  hyperlipidemia, peripheral artery disease, paroxysmal atrial fibrillation on Eliquis, diabetes, left leg amputation presents with epigastric pain for the past 4 days.  Denies nausea or vomiting, denies GI bleeding.  Some exertional shortness of breath and generalized weakness.     Of note he has had upper GI bleeding back in 2022 which showed gastropathy.   CXR: No active cardiopulmonary disease. Streaky atelectasis or scarring  at the bases.  He resides at a [employer].   OF NOTE: receives Palliative Care services baseline.  Subjective: Pt alert, pleasant, and cooperative. Denies hx of dysphagia, but endorses chronic cough which pt attributes to smoking.  Recommendations for follow up therapy are one component of a multi-disciplinary discharge planning process, led by the attending physician.  Recommendations may be updated based on patient status, additional functional criteria and insurance authorization. Assessment /  Plan / Recommendation   11/08/2022   9:43 AM Clinical Impressions Clinical Impression Pt seen for MBSS. Pt alert, pleasant, and cooperative. hx of dysphagia, but endorses chronic cough which pt attributes to smoking. On 2L/min O2 via [place]. Pt given trials of solid, pureed, nectar-thick liquids (via tsp, cup), and thin liquids (via tsp, cup). Lordotic spinal curvature appreciated.  Pt presents with a mild oropharyngeal dysphagia. Oral phase notable for trace oral residual across consistencies and prolonged mastication of solids. Pharyngeal phase notable for trace-mild pharyngeal stasis (vallecular, pyriform sinus, and posterior pharyngeal wall), swallow initiation at the vallecula and/or pyriform sinus, transient during the swallow penetration of nectar-thick liquids via cup, and SILENT before the swallow aspiration of thin liquids via cup. Above mentioned deficits secondary to: xerostomia, reduced base of tongue retraction, reduced hyolaryngeal elevation, reduced/mistimed laryngeal vesitbule closure, reduced pharyngeal stripping wave, and reduced laryngeal sensation. Recommend a mech soft diet with nectar-thick liquids with safe swallowing strategies/aspiration precautions as outlined below. Education: Pt educated at [employer] re: diet recommendations, safe swallowing strategies/aspiration precautions, results of assessment, rationale for thickened liquids, risks of aspiration/aspiration PNA, relationship between breathing/swallowing, and SLP POC. Pt shown videofluoroscopic images on TIMS after study was completed. Pt asked pertinent questions and verbalized satisfaction with answers provided by SLP. Sign posted in pt's room with diet recommendations and safe swallowing strategies/aspiration precautions. RN made aware of results, recommendations and SLP POC. SLP POC: SLP to f/u per POC for diet tolerance and follow up education. Recommend post-acute SLP services for dysphagia tx.     11/08/2022   9:32 AM Treatment  Recommendations Treatment Recommendations Therapy as outlined in treatment plan below     11/08/2022   9:33 AM Prognosis Prognosis for Safe Diet Advancement Fair Barriers to Reach Goals Cognitive deficits;Time post onset;Severity of deficits   11/08/2022   9:32 AM Diet Recommendations SLP Diet Recommendations Dysphagia 3 (Mech soft) solids;Nectar thick liquid Medication Administration -- Compensations Minimize environmental distractions;Slow rate;Small sips/bites Postural Changes Remain semi-upright after after feeds/meals (Comment);Seated upright at 90 degrees     11/08/2022   9:32 AM Other Recommendations Oral Care Recommendations Oral care QID Other Recommendations Order thickener from pharmacy;Prohibited food (jello, ice cream, thin soups);Remove water pitcher;Have oral suction available Follow Up Recommendations Skilled nursing-short term rehab (<3 hours/day) Functional Status Assessment Patient has had a recent decline in their functional status and/or demonstrates limited ability to make significant improvements in function in a reasonable and predictable amount of time   11/08/2022   9:32 AM Frequency and Duration  Speech Therapy Frequency (ACUTE ONLY) min 2x/week     11/08/2022   9:39 AM Oral Phase Oral -  Nectar Teaspoon Lingual/palatal residue;Premature spillage Oral - Nectar Cup Premature spillage;Lingual/palatal residue Oral - Nectar Straw NT Oral - Thin Teaspoon Lingual/palatal residue;Premature spillage Oral - Thin Cup Lingual/palatal residue;Premature spillage Oral - Thin Straw NT Oral - Puree WFL Oral - Mech Soft NT Oral - Regular Impaired mastication    11/08/2022   9:40 AM Pharyngeal Phase Pharyngeal- Nectar Teaspoon Delayed swallow initiation-pyriform sinuses;Pharyngeal residue - pyriform;Pharyngeal residue - valleculae;Reduced tongue base retraction;Reduced laryngeal elevation Pharyngeal Material does not enter airway Pharyngeal- Nectar Cup Delayed swallow initiation-pyriform sinuses;Reduced  airway/laryngeal closure;Penetration/Aspiration during swallow;Pharyngeal residue - valleculae;Pharyngeal residue - pyriform;Reduced laryngeal elevation;Reduced tongue base retraction Pharyngeal Material enters airway, remains ABOVE vocal cords then ejected out Pharyngeal- Nectar Straw NT Pharyngeal- Puree Delayed swallow initiation-vallecula Pharyngeal Material does not enter airway Pharyngeal- Mechanical Soft NT Pharyngeal- Regular Delayed swallow initiation-vallecula Pharyngeal- Multi-consistency NT    11/08/2022   9:37 AM Cervical Esophageal Phase  Cervical Esophageal Phase [facility] Cherrie Gauze, M.S., [facility] [phone] [place] Denis) Quintella Baton 11/08/2022, 9:49 AM                     CT HEAD WO CONTRAST (5MM)  Result Date: 11/07/2022 CLINICAL DATA:  Altered level of consciousness. EXAM: CT HEAD WITHOUT CONTRAST TECHNIQUE: Contiguous axial images were obtained from the base of the skull through the vertex without intravenous contrast. RADIATION DOSE REDUCTION: This exam was performed according to the departmental dose-optimization program which includes automated exposure control, adjustment of the mA and/or kV according to patient size and/or use of iterative reconstruction technique. COMPARISON:  None Available. FINDINGS: Brain: There is no evidence for acute hemorrhage, hydrocephalus, mass lesion, or abnormal extra-axial fluid collection. No definite CT evidence for acute infarction. Diffuse loss of parenchymal volume is consistent with atrophy. Patchy low attenuation in the deep hemispheric and periventricular white matter is nonspecific, but likely reflects chronic microvascular ischemic demyelination. 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 Vascular: No hyperdense vessel or unexpected calcification. Skull: No evidence for fracture. No worrisome lytic or sclerotic lesion. Sinuses/Orbits: The visualized paranasal sinuses and  mastoid air cells are clear. Visualized portions of the globes and intraorbital fat are unremarkable. Other: None. IMPRESSION: 1. No acute intracranial abnormality. 2. 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 3. Atrophy with chronic small vessel ischemic disease. Electronically Signed   By: Misty Stanley M.D.   On: 11/07/2022 08:42   CT ANGIO GI BLEED  Result Date: 10/31/2022 CLINICAL DATA:  Upper GI bleed with chest pain. EXAM: CTA ABDOMEN AND PELVIS WITHOUT AND WITH CONTRAST TECHNIQUE: Multidetector CT imaging of the abdomen and pelvis was performed using the standard protocol during bolus administration of intravenous contrast. Multiplanar reconstructed images and MIPs were obtained and reviewed to evaluate the vascular anatomy. RADIATION DOSE REDUCTION: This exam was performed according to the departmental dose-optimization program which includes automated exposure control, adjustment of the mA and/or kV according to patient size and/or use of iterative reconstruction technique. CONTRAST:  41m OMNIPAQUE IOHEXOL 350 MG/ML SOLN COMPARISON:  CTs of abdomen and pelvis with contrast 07/17/2022 and 07/15/2021. FINDINGS: VASCULAR Aorta: There is extensive calcific plaque in the aortic wall without penetrating ulcer, critical aortic stenosis or dissection. Maximum aortic caliber 2.6 cm.Recommend follow-up every 5 years. Reference: J Am Coll Radiol 28295;62:130-865 Celiac: There are ostial calcifications causing a 50% vessel origin stenosis. There are additional nonstenosing wall calcifications more distally. There are patchy calcific plaques in the splenic artery and hepatic arteries. SMA: There  moderate to heavy patchy wall calcifications but no more than 40% luminal stenosis. There is however, a high-grade proximal mixed plaque stenosis in the dominant right-sided trifurcation division of the SMA, with reconstitution distally as it extends to the left. Renals: Both renal arteries are single. There are patchy calcific  plaques on the left-greater-than-right. There is up to 50-60% irregular stenosis of the left renal artery, right renal artery with up to 40% origin stenosis and otherwise well patent, with renovascular calcifications at both renal hila, more extensive on the left. IMA: Occluded. Inflow: Heavily calcified. 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 Slightly distal to this there is a 75% calcific stenosis focally just proximal the vessel bifurcation and no other focal stenosis. On the left there is a 60% common iliac arterial focal stenosis at the bifurcation. There is moderate irregular stenosis along both internal iliac arteries. Right external iliac artery demonstrating irregular up to 50% stenosis due to calcific plaques. Left external iliac artery occludes for a short-segment distal to its origin, then reconstitutes for the rest of its length but with only about 20% patency generally. Proximal Outflow: There is at least 80% calcific stenosis in the proximal right common femoral artery. The proximal deep and circumflex femoral arteries are heavily calcified and at least moderately stenotic. The proximal right SFA is occluded. There is a bypass graft from the right common femoral artery to the lowest point of the study near the base of the scrotum, which is patent. The graft arises distal to the proximal common femoral artery stenosis. On the left, the deep and circumflex femoral arteries are heavily calcified but at least partially patent. There is occlusion of the left common femoral artery, occlusion of the native left SFA, and an occluded left femoral bypass graft arising from the proximal common femoral artery and itself is proximally collapsed. There are multiple surgical clips in both inguinal areas. Veins: Patent as far as visualized. The left iliac veins are smaller than the right especially the left common iliac vein, which is compressed against  the spine by the aorta and proximal left common iliac artery. Review of the MIP images confirms the above findings. NON-VASCULAR Lower chest: The cardiac size is normal. There are three-vessel coronary artery calcifications, aortic valve TAVR, and heavy calcification in the inferolateral mitral ring. The intraventricular blood pool is low in density consistent with anemia. There is interval worsening of bilateral lower lobe bronchial thickening, and on the right, interval worsening multifocal segmental and subsegmental bronchial/bronchiolar plugging in the middle and lower lobes, with patchy tree-in-bud interstitial changes also worsened. There is associated hazy airspace disease in the right middle lobe, posterior basal consolidation right lower lobe. Findings consistent with bronchopneumonia and probable aspiration pneumonitis. Please correlate clinically and consider aspiration precautions. Lung bases elsewhere are clear. Hepatobiliary: Mildly steatotic liver without mass. Surgically absent gallbladder without biliary dilatation. Pancreas: No abnormality. Spleen: No abnormality.  No splenomegaly. Adrenals/Urinary Tract: There is no adrenal mass. There are a few cortical hypodensities in both kidneys which are too small to characterize. These are unchanged. Again noted is a 1.7 cm complex septated cystic lesion in the superior pole of the right kidney. Although unchanged, MRI follow-up is recommended for further evaluation. There are renovascular calcifications at the renal hila but no urolithiasis is seen, no hydronephrosis or urinary obstruction. The bladder is unremarkable. Stomach/Bowel: There is chronic fold thickening and distended appearance of the stomach, likely chronic  gastritis. Endoscopy may be indicated but this seems unchanged. There is a debris-filled descending duodenal diverticulum again measuring 2.6 cm. The small and large bowel show no acute abnormality. The appendix is normal caliber. There is  chronic dilatation of a left lower quadrant postsurgical small bowel segment, without associated obstruction. There is moderate retained stool ascending and transverse colon with terminal ileal fecal backup. There are scattered uncomplicated sigmoid diverticula. No contrast extravasation is seen into bowel. Lymphatic: No adenopathy is seen. Reproductive: There is a normal size prostate with dystrophic calcifications centrally. Both testicles are in the scrotal sac. There are epididymal tail calcifications. No hydroceles. Other: There is no free air, free fluid, free hemorrhage or incarcerated hernia. Musculoskeletal: There is osteopenia with degenerative changes of the spine. There is mild chronic anterior wedging of the T12 vertebral body. L5 is transitional. There are chronic healed left pubic rami fractures. IMPRESSION: 1. No appreciable active GI bleeding. 2. Extensive aortic and branch vessel atherosclerosis. 3. 2.6 cm abdominal aortic caliber. Imaging follow-up every 5 years recommended. 4. 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 5. High-grade proximal mixed plaque stenosis in the dominant right-sided trifurcation division of the SMA, with reconstitution distally. 6. Bilateral renal artery stenoses, left greater than right. 7. Left external iliac artery proximal occlusion with about 20% reconstitution for the rest of its length. 8. Right common femoral artery bypass graft is patent as far as seen. 9. Bilateral native SFA occlusion, occluded left common femoral artery, and occluded left femoral bypass graft. 10. Interval worsening of bilateral lower lobe bronchial thickening and right middle and lower lobe segmental and subsegmental bronchial/bronchiolar plugging with associated tree-in-bud interstitial changes and increased airspace disease in the right middle lobe and posterior basal right lower lobe. Findings consistent with bronchopneumonia with high likelihood of aspiration etiology. Please correlate clinically  and consider aspiration precautions. 11. 1.7 cm complex septated cystic lesion in the superior pole of the right kidney. Nonemergent MRI follow-up is recommended. 12. Constipation with diverticulosis and distal ileal fecal back up. 13. Remaining findings discussed above. Aortic Atherosclerosis (ICD10-I70.0). Electronically Signed   By: Telford Nab M.D.   On: 10/31/2022 20:56   DG Chest 2 View  Result Date: 10/31/2022 CLINICAL DATA:  Chest pain EXAM: CHEST - 2 VIEW COMPARISON:  07/17/2022 FINDINGS: Post sternotomy changes. No acute airspace disease or effusion. Streaky atelectasis or scarring at the posterior lung base. Valve prosthesis. Stable cardiomediastinal silhouette with aortic atherosclerosis. No pneumothorax IMPRESSION: No active cardiopulmonary disease. Streaky atelectasis or scarring at the bases. Electronically Signed   By: Donavan Foil M.D.   On: 10/31/2022 17:59      Subjective: Seen and examined on the day of discharge.  Stable no distress.  No further bleeding.  Hemoglobin stable  Discharge Exam: Vitals:   11/29/22 0408 11/29/22 0840  BP: 137/80 116/69  Pulse: 89 80  Resp: 18 18  Temp: 98.6 F (37 C) 97.7 F (36.5 C)  SpO2: 100% 100%   Vitals:   11/28/22 1540 11/28/22 2012 11/29/22 0408 11/29/22 0840  BP: 131/67 116/70 137/80 116/69  Pulse: 71 83 89 80  Resp: '16 18 18 18  '$ Temp: 98.4 F (36.9 C) 98.8 F (37.1 C) 98.6 F (37 C) 97.7 F (36.5 C)  TempSrc:  Oral  Oral  SpO2: 92% 98% 100% 100%  Weight:      Height:        General: Pt is alert, awake, not in acute distress Cardiovascular: RRR, S1/S2 +, no rubs, no gallops  Respiratory: CTA bilaterally, no wheezing, no rhonchi Abdominal: Soft, NT, ND, bowel sounds + Extremities: no edema, no cyanosis    The results of significant diagnostics from this hospitalization (including imaging, microbiology, ancillary and laboratory) are listed below for reference.     Microbiology: Recent Results (from the past  240 hour(s))  MRSA Next Gen by PCR, Nasal     Status: None   Collection Time: 11/27/22 11:34 AM   Specimen: Nasal Mucosa; Nasal Swab  Result Value Ref Range Status   MRSA by PCR Next Gen NOT DETECTED NOT DETECTED Final    Comment: Performed at [facility], [address] [place], [place] [address]     Labs: BNP (last 3 results) Recent Labs    11/26/22 1929  BNP 536.6*   Basic Metabolic Panel: Recent Labs  Lab 11/26/22 1929 11/27/22 0607 11/28/22 0423  NA 134* 138 139  K 4.9 3.5 4.1  CL 106 117* 115*  CO2 17* 15* 20*  GLUCOSE 148* 89 98  BUN 32* 23 28*  CREATININE 1.68* 1.15 1.10  CALCIUM 8.4* 6.1* 7.9*   Liver Function Tests: Recent Labs  Lab 11/26/22 1929  AST 18  ALT 11  ALKPHOS 86  BILITOT 0.7  PROT 5.8*  ALBUMIN 3.0*   No results for input(s): "LIPASE", "AMYLASE" in the last 168 hours. No results for input(s): "AMMONIA" in the last 168 hours. CBC: Recent Labs  Lab 11/26/22 1929 11/27/22 0607 11/27/22 1644 11/28/22 0423 11/28/22 1425  WBC 14.5* 8.5  --  10.3  --   NEUTROABS 11.2*  --   --   --   --   HGB 5.4* 6.9* 10.5* 10.2* 10.4*  HCT 19.3* 22.9*  --  32.8* 33.8*  MCV 95.5 87.7  --  86.3  --   PLT 295 210  --  208  --    Cardiac Enzymes: No results for input(s): "CKTOTAL", "CKMB", "CKMBINDEX", "TROPONINI" in the last 168 hours. BNP: Invalid input(s): "POCBNP" CBG: No results for input(s): "GLUCAP" in the last 168 hours. D-Dimer No results for input(s): "DDIMER" in the last 72 hours. Hgb A1c No results for input(s): "HGBA1C" in the last 72 hours. Lipid Profile No results for input(s): "CHOL", "HDL", "LDLCALC", "TRIG", "CHOLHDL", "LDLDIRECT" in the last 72 hours. Thyroid function studies No results for input(s): "TSH", "T4TOTAL", "T3FREE", "THYROIDAB" in the last 72 hours.  Invalid input(s): "FREET3" Anemia work up No results for input(s): "VITAMINB12", "FOLATE", "FERRITIN", "TIBC", "IRON", "RETICCTPCT" in the last 72  hours. Urinalysis    Component Value Date/Time   COLORURINE STRAW (A) 07/15/2021 1839   APPEARANCEUR CLEAR (A) 07/15/2021 1839   LABSPEC 1.014 07/15/2021 1839   PHURINE 5.0 07/15/2021 1839   GLUCOSEU 150 (A) 07/15/2021 1839   HGBUR NEGATIVE 07/15/2021 1839   BILIRUBINUR NEGATIVE 07/15/2021 1839   KETONESUR NEGATIVE 07/15/2021 1839   PROTEINUR NEGATIVE 07/15/2021 1839   NITRITE NEGATIVE 07/15/2021 1839   LEUKOCYTESUR NEGATIVE 07/15/2021 1839   Sepsis Labs Recent Labs  Lab 11/26/22 1929 11/27/22 0607 11/28/22 0423  WBC 14.5* 8.5 10.3   Microbiology Recent Results (from the past 240 hour(s))  MRSA Next Gen by PCR, Nasal     Status: None   Collection Time: 11/27/22 11:34 AM   Specimen: Nasal Mucosa; Nasal Swab  Result Value Ref Range Status   MRSA by PCR Next Gen NOT DETECTED NOT DETECTED Final    Comment: Performed at [facility], [address] [place],  [address]  Time coordinating discharge: Over 30 minutes  SIGNED:   Sidney Ace, MD  [facility] 11/29/2022, 10:42 AM Pager   If 7PM-7AM, please contact night-coverage

## 2022-11-29 NOTE — ED Provider Notes (Signed)
[facility] Provider Note    Event Date/Time   First MD Initiated Contact with Patient 11/29/22 2015     (approximate)   History   Chief Complaint Chest Pain (Patient C/O midsternal chest pain and SOB that started about an hour ago)   HPI  Stafford Riviera is a 77 y.o. male with past medical history of diabetes, CAD, CHF, atrial fibrillation on Eliquis, CKD, and GI bleed who presents to the ED complaining of chest pain.  Patient reports about 1 hour prior to arrival he developed acute onset of pain in the center of his chest, which he describes as "like I was kicked."  He states the pain has been present constantly for the past hour, not alleviated by nitroglycerin or aspirin administered by EMS.  He denies any recent fevers or cough, states he was feeling well earlier in the day, but does report some difficulty breathing since the chest pain started.  He has not noticed any pain or swelling in his legs.  He was recently admitted to the hospital for GI bleed, discharged earlier today.     Physical Exam   Triage Vital Signs: ED Triage Vitals  Enc Vitals Group     BP 11/29/22 2015 (!) 119/96     Pulse Rate 11/29/22 2015 (!) 34     Resp --      Temp --      Temp src --      SpO2 11/29/22 2015 (!) 86 %     Weight 11/29/22 2019 122 lb 5.7 oz (55.5 kg)     Height --      Head Circumference --      Peak Flow --      Pain Score 11/29/22 2018 10     Pain Loc --      Pain Edu? --      Excl. in [place]? --     Most recent vital signs: Vitals:   11/29/22 2035 11/29/22 2230  BP:  105/64  Pulse: 81 73  Resp: 20 (!) 21  Temp:    SpO2: 100% 100%    Constitutional: Alert and oriented. Eyes: Conjunctivae are normal. Head: Atraumatic. Nose: No congestion/rhinnorhea. Mouth/Throat: Mucous membranes are moist.  Cardiovascular: Normal rate, regular rhythm. Grossly normal heart sounds.  2+ radial pulses bilaterally. Respiratory: Normal respiratory effort.  No  retractions. Lungs CTAB.  Anterior chest wall tenderness to palpation, reproducing described pain. Gastrointestinal: Soft and nontender. No distention. Musculoskeletal: No lower extremity tenderness nor edema.  Neurologic:  Normal speech and language. No gross focal neurologic deficits are appreciated.    ED Results / Procedures / Treatments   Labs (all labs ordered are listed, but only abnormal results are displayed) Labs Reviewed  CBC WITH DIFFERENTIAL/PLATELET - Abnormal; Notable for the following components:      Result Value   WBC 11.7 (*)    RBC 3.65 (*)    Hemoglobin 9.9 (*)    HCT 32.8 (*)    RDW 19.7 (*)    Neutro Abs 9.2 (*)    All other components within normal limits  BASIC METABOLIC PANEL - Abnormal; Notable for the following components:   CO2 21 (*)    Glucose, Bld 147 (*)    BUN 33 (*)    Creatinine, Ser 1.26 (*)    Calcium 8.5 (*)    GFR, Estimated 58 (*)    All other components within normal limits  MAGNESIUM  TROPONIN I (HIGH SENSITIVITY)  TROPONIN I (HIGH SENSITIVITY)     EKG  ED ECG REPORT I, Blake Divine, the attending physician, personally viewed and interpreted this ECG.   Date: 11/29/2022  EKG Time: 20:20  Rate: 86  Rhythm: normal sinus rhythm, occasional PVC  Axis: Normal  Intervals: Prolonged QT  ST&T Change: Lateral T wave inversions  RADIOLOGY Chest x-ray reviewed and interpreted by me with no infiltrate, edema, or effusion.  PROCEDURES:  Critical Care performed: No  Procedures   MEDICATIONS ORDERED IN ED: Medications  morphine (PF) 4 MG/ML injection 4 mg (4 mg Intravenous Given 11/29/22 2025)  oxyCODONE-acetaminophen (PERCOCET/ROXICET) 5-325 MG per tablet 1 tablet (1 tablet Oral Given 11/29/22 2234)     IMPRESSION / MDM / ASSESSMENT AND PLAN / ED COURSE  I reviewed the triage vital signs and the nursing notes.                              77 y.o. male with past medical history of diabetes, CAD, CHF, atrial fibrillation  on Eliquis, CKD, and GI bleeding who presents to the ED complaining of acute onset dull pain in the center of his chest for about the past hour, associated with some difficulty breathing.  Patient's presentation is most consistent with acute presentation with potential threat to life or bodily function.  Differential diagnosis includes, but is not limited to, ACS, PE, dissection, pneumonia, pneumothorax, musculoskeletal pain, GERD, anemia, electrolyte abnormality, AKI.  Patient chronically ill but nontoxic-appearing and in no acute distress, vital signs are unremarkable.  His pain is reproducible with palpation and overall seems atypical for ACS, EKG shows lateral T wave inversions, however this seems similar to priors.  He received aspirin load and nitroglycerin prior to arrival, has ongoing pain that we will treat with IV morphine.  Chest x-ray and labs are pending at this time.  Labs remarkable for mild AKI with no significant electrolyte abnormality, anemia stable compared to previous with no evidence of recurrent GI bleeding.  2 sets of troponin are within normal limits and I doubt ACS given atypical symptoms.  Pain much improved following pain medication and I doubt PE given reassuring vital signs.  Patient is appropriate for discharge back to nursing facility with outpatient follow-up, was counseled to return to the ED for new or worsening symptoms.  Patient agrees with plan.      FINAL CLINICAL IMPRESSION(S) / ED DIAGNOSES   Final diagnoses:  Nonspecific chest pain     Rx / DC Orders   ED Discharge Orders     None        Note:  This document was prepared using Dragon voice recognition software and may include unintentional dictation errors.   Blake Divine, MD 11/29/22 [phone]

## 2022-11-29 NOTE — Progress Notes (Signed)
Patient with a stable Hb. Has refused a prep and colonoscopy multiple times and when he agreed to the procedure he would then not take the prep. Nothing further to offer from a GI point of view.  I will sign off.  Please call if any further GI concerns or questions.  We would like to thank you for the opportunity to participate in the care of Shawn Jennings.

## 2022-11-29 NOTE — Progress Notes (Signed)
Patient discharged back to [facility] and confirmed by case worker.  Attempted to call report x4 without success.EMS transfer.

## 2022-11-29 NOTE — Progress Notes (Signed)
Report successfully called to [facility] at [facility].

## 2022-11-29 NOTE — TOC Initial Note (Signed)
Transition of Care [facility]) - Initial/Assessment Note    Patient Details  Name: Shawn Jennings MRN: [id number] Date of Birth: 03/28/1946  [facility] CM/SW Contact:    Beverly Sessions, RN Phone Number: 11/29/2022, 10:34 AM  Clinical Narrative:                  Patient from [place] Per Anderson [place] at [facility] that [facility] will need to coordinate discharge with Catina at [employer] with [place] by phone. She confirms that  -patient can return today - patient does not need and assessment - patient does not need fl2 - email dc summary to [email or web address] - confirms patient has a referral to [facility] but was not opened prior to admission - confirms patient has working O2 at facility   [place] with [facility] notified of discharge. She states that they can complete the face to face at [place].  Catina aware  Patient in agreement with plan  EMS transport called [facility] RN give number to call report        Patient Goals and CMS Choice            Expected Discharge Plan and Services         Expected Discharge Date: 11/29/22                                    Prior Living Arrangements/Services                       Activities of Daily Living Home Assistive Devices/Equipment: Gilford Rile (specify type) ADL Screening (condition at time of admission) Patient's cognitive ability adequate to safely complete daily activities?: Yes Is the patient deaf or have difficulty hearing?: No Does the patient have difficulty seeing, even when wearing glasses/contacts?: No Does the patient have difficulty concentrating, remembering, or making decisions?: Yes Patient able to express need for assistance with ADLs?: Yes Does the patient have difficulty dressing or bathing?: No Independently performs ADLs?: No Communication: Independent Dressing (OT): Needs assistance Is this a change from baseline?:  Pre-admission baseline Grooming: Independent Feeding: Independent Bathing: Needs assistance Is this a change from baseline?: Pre-admission baseline Toileting: Needs assistance Is this a change from baseline?: Pre-admission baseline In/Out Bed: Needs assistance Is this a change from baseline?: Pre-admission baseline Walks in Home: Needs assistance Is this a change from baseline?: Pre-admission baseline Does the patient have difficulty walking or climbing stairs?: Yes Weakness of Legs: Both Weakness of Arms/Hands: None  Permission Sought/Granted                  Emotional Assessment              Admission diagnosis:  Lactic acidosis [E87.20] Blood loss anemia [D50.0] GI bleed [K92.2] Upper GI bleed [K92.2] Anticoagulated [Z79.01] Patient Active Problem List   Diagnosis Date Noted   Lung nodule 11/29/2022   Pressure injury of skin 11/29/2022   Hypocalcemia 11/27/2022   Gastric ulcer 11/06/2022   Blood loss anemia 76/16/0737   Metabolic acidosis 10/62/6948   Dyspepsia 11/01/2022   Atherosclerosis 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 ([place]) 08/08/2022   Aspiration pneumonia ([place]) 07/18/2022   Dyslipidemia 07/18/2022   Non-STEMI (non-ST elevated myocardial infarction) ([place]) 07/17/2022   HFrEF (heart failure with reduced ejection fraction) ([place]) 06/11/2022   Anemia 11/09/2021   NSTEMI (non-ST elevated myocardial infarction) (  [place]) 11/08/2021   Acute on chronic systolic CHF (congestive heart failure) ([place]) 11/08/2021   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[place]) 08/04/2021   Chronic obstructive pulmonary disease, unspecified ([place]) 08/04/2021   Hyperthyroidism 08/04/2021   CHF exacerbation ([place]) 03/83/3383   Acute diastolic CHF (congestive heart failure) ([place]) 08/03/2021   COVID-19 virus infection 08/03/2021   Hypotension    AKI (acute kidney injury) ([place])    Acute blood loss anemia    Hypokalemia    Type 2 diabetes mellitus with stage  3a chronic kidney disease, without long-term current use of insulin (HCC)    Abdominal pain    GI bleed 07/15/2021   PAF (paroxysmal atrial fibrillation) ([place]) 07/15/2021   On anticoagulant therapy 07/15/2021   Depression 07/15/2021   GERD (gastroesophageal reflux disease) 07/15/2021   Tobacco abuse 07/15/2021   Hyperlipidemia 07/15/2021   Encounter for fitting and adjustment of other gastrointestinal appliance and device    Cholangitis    Protein-calorie malnutrition, severe 10/18/2020   Choledocholithiasis 10/17/2020   Coronary artery disease of native artery of native heart with stable angina pectoris ([place])    Ischemic cardiomyopathy    Ischemic chest pain ([place]) 03/10/2020   Chronic kidney disease, stage 3a ([place]) 03/09/2020   Essential hypertension 03/09/2020   Chest pain 01/27/2019   Nonspecific chest pain 01/26/2019   PCP:  Virgie Dad, FNP Pharmacy:   [place], [place] - [address] [place] [address] Phone: [phone] Fax: [phone]     Social Determinants of Health (SDOH) Social History: SDOH Screenings   Food Insecurity: No Food Insecurity (11/27/2022)  Housing: Low Risk  (11/27/2022)  Transportation Needs: No Transportation Needs (11/27/2022)  Utilities: Not At Risk (11/27/2022)  Depression (PHQ2-9): Low Risk  (06/11/2022)  Tobacco Use: High Risk (11/28/2022)   SDOH Interventions:     Readmission Risk Interventions    11/29/2022   10:31 AM 11/08/2022    1:04 PM  Readmission Risk Prevention Plan  Transportation Screening Complete Complete  Medication Review (RN Care Manager) Complete Complete  PCP or Specialist appointment within 3-5 days of discharge  Complete  HRI or Home Care Consult  Complete  Palliative Care Screening Complete Not [place] Not Applicable Complete

## 2022-12-03 ENCOUNTER — Non-Acute Institutional Stay: Payer: Medicare Other | Admitting: Hospice

## 2022-12-03 DIAGNOSIS — J449 Chronic obstructive pulmonary disease, unspecified: Secondary | ICD-10-CM

## 2022-12-03 DIAGNOSIS — I5042 Chronic combined systolic (congestive) and diastolic (congestive) heart failure: Secondary | ICD-10-CM

## 2022-12-03 DIAGNOSIS — K922 Gastrointestinal hemorrhage, unspecified: Secondary | ICD-10-CM

## 2022-12-03 DIAGNOSIS — Z515 Encounter for palliative care: Secondary | ICD-10-CM

## 2022-12-03 DIAGNOSIS — D500 Iron deficiency anemia secondary to blood loss (chronic): Secondary | ICD-10-CM

## 2022-12-03 NOTE — Progress Notes (Signed)
    [facility] Consult Note Telephone: [phone]  Fax: [phone]    Date of encounter: 12/03/22 12:30PM PATIENT NAME: Shawn Jennings [id number]   [phone] (home)  DOB: Aug 28, 1946 MRN: [id number] PRIMARY CARE PROVIDER:    Estell Harpin, NP  REFERRING PROVIDER:   Estell Harpin, NP  RESPONSIBLE PARTY:    Contact Information     Name Relation Home Work Kimberly Other   [phone]        I met face to face with patient in the facility. Palliative Care was asked to follow this patient by consultation request of  Eventus Su Monks, NP to address advance care planning and complex medical decision making. This is a follow up visit.                                   ASSESSMENT AND PLAN / RECOMMENDATIONS:   Advance Care Planning/Goals of Care: Goals include to maximize quality of life and symptom management.   CODE STATUS: Full Code  Symptom Management/Plan: GI bleed: Recent hospitalization for GI bleed 2/5 - 11/29/2022 for Which patient received 3 units of packed red blood cells; hemoglobin level stabilized and discharged to assisted living for ongoing care.  No GI bleed since return to AL per nursing.  Continue Protonix as ordered.  Follow-up with GI as planned. Anemia: Secondary to history of GI bleed.  Continue ferrous sulfate as ordered.  Routine CBC BMP. Chronic systolic and diastolic heart failure-no exacerbation.  Continue carvedilol, Farxiga, entresto, furosemide and spironolactone as directed. Follow up with HF clinic as scheduled.  COPD: No exacerbation. Uses oxygen supplementation at night 2L/Min. Continue breathing treatments as ordered. Avoid triggers/smoking.  Insomnia: Managed with melatonin.  Sleep hygiene encouraged  Follow up Palliative Care Visit: Palliative care will continue to follow for complex medical decision  making, advance care planning, and clarification of goals. Return in 8 weeks or prn.  PPS: 50%  HOSPICE ELIGIBILITY/DIAGNOSIS: TBD  Chief Complaint: Palliative Medicine follow up visit.   HISTORY OF PRESENT ILLNESS:  Shawn Jennings is a 77 y.o. year old male  with heart failure with reduced EF, hypertension, ischemic cardiomyopathy, atrial fibrillation, CKD, hx of COPD, aspiration pneumonia, T2DM, insomnia, hx of GI bleed. Patient hospitalized 9/26-9/28/23 due to NSTEMI- s/p cardiac cath, LAKA.  Patient denies pain/discomfort, no dizziness no palpitations. History obtained from review of EMR, discussion with primary team, and interview with family, facility staff/caregiver and/or Shawn Jennings.  A 10 point ROS asked and negative. I reviewed available labs, medications, imaging, studies and related documents from the EMR.  Records reviewed and summarized above.   I spent 45 minutes providing this consultation; this includes time spent with patient/family, chart review and documentation. More than 50% of the time in this consultation was spent on counseling and coordinating communication. Thank you for the opportunity to participate in the care of Shawn Jennings.  The palliative care team will continue to follow. Please call our office at [phone] if we can be of additional assistance.   Teodoro Spray, NP

## 2022-12-06 ENCOUNTER — Ambulatory Visit (INDEPENDENT_AMBULATORY_CARE_PROVIDER_SITE_OTHER): Payer: Medicare Other | Admitting: Podiatry

## 2022-12-06 ENCOUNTER — Ambulatory Visit: Payer: Medicare Other | Attending: Nurse Practitioner | Admitting: Nurse Practitioner

## 2022-12-06 ENCOUNTER — Encounter: Payer: Self-pay | Admitting: Podiatry

## 2022-12-06 ENCOUNTER — Encounter: Payer: Self-pay | Admitting: Nurse Practitioner

## 2022-12-06 VITALS — BP 80/45 | HR 86

## 2022-12-06 DIAGNOSIS — E1122 Type 2 diabetes mellitus with diabetic chronic kidney disease: Secondary | ICD-10-CM | POA: Diagnosis not present

## 2022-12-06 DIAGNOSIS — B351 Tinea unguium: Secondary | ICD-10-CM | POA: Diagnosis not present

## 2022-12-06 DIAGNOSIS — N1831 Chronic kidney disease, stage 3a: Secondary | ICD-10-CM

## 2022-12-06 DIAGNOSIS — M79675 Pain in left toe(s): Secondary | ICD-10-CM | POA: Diagnosis not present

## 2022-12-06 DIAGNOSIS — M79674 Pain in right toe(s): Secondary | ICD-10-CM

## 2022-12-06 NOTE — Progress Notes (Signed)
This patient returns to my office for at risk foot care.  This patient requires this care by a professional since this patient will be at risk due to having  PAD and amputation left leg.  This patient is unable to cut nails himself since the patient cannot reach his nails.These nails are painful walking and wearing shoes. Patient presents to the office in a wheelchair.   This patient presents for at risk foot care today.  General Appearance  Alert, conversant and in no acute stress.  Vascular  Dorsalis pedis and posterior tibial  pulses are  weakly/not palpable  bilaterally.  Capillary return is within normal limits  bilaterally. Cold feet  bilaterally.  Neurologic  Senn-Weinstein monofilament wire test within normal limits  bilaterally. Muscle power within normal limits bilaterally.  Nails Thick disfigured discolored nails with subungual debris  from hallux to fifth toes right.. No evidence of bacterial infection or drainage bilaterally.  Orthopedic  No limitations of motion  feet .  No crepitus or effusions noted.  No bony pathology or digital deformities noted.  Skin  normotropic skin with no porokeratosis noted bilaterally.  No signs of infections or ulcers noted.     Onychomycosis  Pain in right toes  Pain in left toes  Consent was obtained for treatment procedures.   Mechanical debridement of nails 1-5  bilaterally performed with a nail nipper.  Filed with dremel without incident.    Return office visit   3 months                   Told patient to return for periodic foot care and evaluation due to potential at risk complications.   Gardiner Barefoot DPM

## 2022-12-06 NOTE — Progress Notes (Deleted)
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 06/2022 Echo: Asc Ao 7m.   Aortic stenosis    a. Pt unaware of history but CT imaging 01/2019 consistent w/ AVR; b. 01/2019 Echo: Mild to mod AS. Mean grad 178mg; c. 06/2022 Echo: Nl fxn'ing prosth AoV. Asc Ao 4036m  CAD (coronary artery disease)    [place] Cath: Sev 3VD, patent grafts->Med Rx; c. 06/2022 NSTEMI/PCI: LM diff dzs, LAD 100m22m 40, LCX 100p/m, OM3 mod dzs, RCA 100p, LIMA->LAD ok, RIMA->RPDA 40ost, VG->D2 30p/95p (3.0x22 Onyx Frontier DES)--< L Radial-OM3 20ost.   Chronic HFrEF (heart failure with reduced ejection fraction) (HCC)[place] a. 01/2019 Echo: EF 40-45%; b. 04/2020 Echo: EF 25-30%; c. 09/2020 Echo: EF 50-55%; d. 07/2021 Echo: EF 20-25%; e. 06/2022 Echo: EF 20-25%, glob HK, mild LVH, GrII DD, nl RV fxn, nl fxn'ing bioprosth AoV, Asc Ao 40mm30mCKD (chronic kidney disease), stage III (HCC)    COPD (chronic obstructive pulmonary disease) (HCC[place]    Essential hypertension    GERD (gastroesophageal reflux disease)    Hyperlipidemia LDL goal <70    Ischemic cardiomyopathy    a. 01/2019 Echo: EF 40-45%; b. 04/2020 Echo: EF 25-30%; c. 09/2020 Echo: EF 50-55%; d. 07/2021 Echo: EF 20-25%; e. 06/2022 Echo: EF 20-25%.   PAD (peripheral artery disease) (HCC) [place]. [place] s/p L  AKA.   PAF (paroxysmal atrial fibrillation) (HCC)    a. CHA2DS2VASc = 6-->Eliquis   Thyrotoxicosis    Tobacco abuse    Type II diabetes mellitus (HCC) [place]:  Procedure Laterality Date   CARDIAC CATHETERIZATION     CHOLECYSTECTOMY     COLONOSCOPY     COLONOSCOPY WITH PROPOFOL N/A 07/20/2021   Procedure: COLONOSCOPY WITH PROPOFOL;  Surgeon: VangaLin Landsman  [facility]  Service: Gastroenterology;  Laterality: N/A;   COLONOSCOPY WITH PROPOFOL N/A 11/06/2022   Procedure: COLONOSCOPY WITH PROPOFOL;  Surgeon: Wohl,[name]  [facility];  Service: Endoscopy;  Laterality: N/A;   CORONARY ARTERY BYPASS GRAFT     a. 1994 [place] 07/18/2022   Procedure: CORONARY STENT INTERVENTION;  Surgeon: End, Nelva Bush  [facility];  Service: Cardiovascular;  Laterality: N/A;   ENDOSCOPIC RETROGRADE CHOLANGIOPANCREATOGRAPHY (ERCP) WITH PROPOFOL N/A 10/19/2020   Procedure: ENDOSCOPIC RETROGRADE CHOLANGIOPANCREATOGRAPHY (ERCP) WITH PROPOFOL;  Surgeon: Wohl,[name]  [facility];  Service: Endoscopy;  Laterality: N/A;   ERCP N/A 12/29/2020   Procedure: ENDOSCOPIC RETROGRADE CHOLANGIOPANCREATOGRAPHY (ERCP);  Surgeon: Wohl,[name]  [facility];  Service: Endoscopy;  Laterality: N/A;   ESOPHAGOGASTRODUODENOSCOPY (EGD) WITH PROPOFOL N/A 07/18/2021   Procedure: ESOPHAGOGASTRODUODENOSCOPY (EGD) WITH PROPOFOL;  Surgeon: VangaLin Landsman  Location: [facility]  Service: Gastroenterology;  Laterality: N/A;   ESOPHAGOGASTRODUODENOSCOPY (EGD) WITH PROPOFOL N/A 11/06/2022   Procedure: ESOPHAGOGASTRODUODENOSCOPY (EGD) WITH PROPOFOL;  Surgeon: [name], MD;  Location: [facility];  Service: Endoscopy;  Laterality: N/A;   ESOPHAGOGASTRODUODENOSCOPY (EGD) WITH PROPOFOL N/A 11/27/2022   Procedure: ESOPHAGOGASTRODUODENOSCOPY (EGD) WITH PROPOFOL;  Surgeon: [name], MD;  [facility];  Service: Endoscopy;  Laterality: N/A;   Left AKA     RIGHT HEART CATH AND CORONARY/GRAFT ANGIOGRAPHY N/A 07/18/2022   Procedure: RIGHT HEART CATH AND CORONARY/GRAFT ANGIOGRAPHY;  Surgeon: Nelva Bush, MD;  Location: [facility];  Service: Cardiovascular;  Laterality: N/A;   RIGHT/LEFT HEART CATH AND CORONARY ANGIOGRAPHY N/A 06/06/2020   Procedure: RIGHT/LEFT HEART CATH AND CORONARY ANGIOGRAPHY;  Surgeon: Wellington Hampshire, MD;  Location: [facility];  Service: Cardiovascular;  Laterality: N/A;    Allergies  No Known Allergies  History of Present Illness    77 year old male with history of CAD status post CABG in 1994, chronic chest pain, hypertension, hyperlipidemia, paroxysmal atrial fibrillation, ischemic cardiomyopathy, chronic HFrEF, aortic stenosis status post AVR, 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  As noted, he underwent CABG and AVR in 1994, in [place].  He has a long history of chest pain with multiple stress test over the years.  He had a high risk stress test in the setting of chest pain in April 2020 with an EF of 40 to 45% by echo at that time.  He was medically managed as he preferred to avoid diagnostic catheterization at that time.  Repeat echo in June 2021 showed further reduction in EF to 25 to 30% with global hypokinesis.  He did undergo diagnostic catheterization August 2021 showing patent grafts with severe underlying coronary disease and normal right heart filling pressures.  In September 2022, he was admitted with upper GI bleeding and required 2 units of packed red blood cells.  EGD showed erosive gastropathy without acute bleeding.  Colonoscopy showed nonbleeding hemorrhoids.  GI cleared him to resume Eliquis in the setting of history of atrial fibrillation.  He was readmitted in October 2022 with COVID and acute on chronic HFrEF, and again in  January 2023 with chest pain, troponin elevation, and volume overload requiring diuresis.  In September 23, Mr. Granier was readmitted with chest pain and non-STEMI.  Echo showed an EF of 20 to 25%.  Diagnostic catheterization revealed 95% proximal stenosis in the vein graft to the D2, with patency of the continuation Y graft of the left radial to the OM 3, LIMA to the LAD, and RIMA to the RPDA.  He otherwise had severe diffuse native disease.  The vein graft to the D2 was successfully treated with a drug-eluting stent and later d/c'd.  He was admitted in 10/2021 w/ a 3 unitl GIB.  Eliquis was held and he underwent EGD showing nonbleeding gastric ulcer.  He refused colonoscopy and with stability of hemoglobin was subsequently discharged back to skilled nursing facility.  Unfortunate, he was readmitted last week with substernal chest pain and bloody stools x 2 weeks.  Hemoglobin was 6.9.  He received 3 units of packed red blood cells.  Eliquis was held and he refused further GI workup.  Home Medications    Current Outpatient Medications  Medication Sig Dispense Refill   acetaminophen (TYLENOL) 500 MG tablet Take 500 mg by mouth every 6 (six) hours as needed (pain).     albuterol (VENTOLIN  HFA) 108 (90 Base) MCG/ACT inhaler Inhale 2 puffs into the lungs every 6 (six) hours as needed for wheezing or shortness of breath.      dapagliflozin propanediol (FARXIGA) 10 MG TABS tablet Take 1 tablet (10 mg total) by mouth daily. 30 tablet 5   DULoxetine (CYMBALTA) 30 MG capsule Take 30 mg by mouth 2 (two) times daily.     ferrous sulfate 325 (65 FE) MG tablet Take 325 mg by mouth every other day.     furosemide (LASIX) 20 MG tablet Take 1 tablet (20 mg total) by mouth daily. 30 tablet 0   isosorbide mononitrate (IMDUR) 30 MG 24 hr tablet Take 1 tablet (30 mg total) by mouth daily. 30 tablet 0   lamoTRIgine (LAMICTAL) 25 MG tablet Take 25 mg by mouth 2 (two) times daily.     Melatonin 5 MG CAPS Take 5 mg by mouth  at bedtime.     montelukast (SINGULAIR) 10 MG tablet Take 10 mg by mouth at bedtime.     Multiple Vitamins-Minerals (PRESERVISION AREDS) CAPS Take 1 capsule by mouth in the morning and at bedtime.     nitroGLYCERIN (NITROSTAT) 0.4 MG SL tablet Place 0.4 mg under the tongue every 5 (five) minutes as needed for chest pain. Give one tablet sublingually q5 minutes x 3 doses PRN for chest pain relief     pantoprazole (PROTONIX) 40 MG tablet Take 1 tablet (40 mg total) by mouth 2 (two) times daily before a meal. 60 tablet 2   sacubitril-valsartan (ENTRESTO) 24-26 MG Take 1 tablet by mouth 2 (two) times daily.     Semaglutide, 1 MG/DOSE, (OZEMPIC, 1 MG/DOSE,) 4 MG/3ML SOPN Inject 1 mg into the skin once a week. Use on Thursdays.     spironolactone (ALDACTONE) 25 MG tablet Take 1 tablet (25 mg total) by mouth daily. 90 tablet 3   No current facility-administered medications for this visit.     Review of Systems    ***.  All other systems reviewed and are otherwise negative except as noted above.    Physical Exam    VS:  There were no vitals taken for this visit. , BMI There is no height or weight on file to calculate BMI.     GEN: Well nourished, well developed, in no acute distress. HEENT: normal. Neck: Supple, no JVD, carotid bruits, or masses. Cardiac: RRR, no murmurs, rubs, or gallops. No clubbing, cyanosis, edema.  Radials 2+/PT 2+ and equal bilaterally.  Respiratory:  Respirations regular and unlabored, clear to auscultation bilaterally. GI: Soft, nontender, nondistended, BS + x 4. MS: no deformity or atrophy. Skin: warm and dry, no rash. Neuro:  Strength and sensation are intact. Psych: Normal affect.  Accessory Clinical Findings    ECG personally reviewed by me today - *** - no acute changes.  Lab Results  Component Value Date   WBC 11.7 (H) 11/29/2022   HGB 9.9 (L) 11/29/2022   HCT 32.8 (L) 11/29/2022   MCV 89.9 11/29/2022   PLT 219 11/29/2022   Lab Results  Component  Value Date   CREATININE 1.26 (H) 11/29/2022   BUN 33 (H) 11/29/2022   NA 137 11/29/2022   K 4.1 11/29/2022   CL 109 11/29/2022   CO2 21 (L) 11/29/2022   Lab Results  Component Value Date   ALT 11 11/26/2022   AST 18 11/26/2022   ALKPHOS 86 11/26/2022   BILITOT 0.7 11/26/2022   Lab Results  Component Value Date  CHOL 164 07/18/2022   HDL 44 07/18/2022   LDLCALC 93 07/18/2022   TRIG 135 07/18/2022   CHOLHDL 3.7 07/18/2022    Lab Results  Component Value Date   HGBA1C 7.7 (H) 07/17/2022    Assessment & Plan    1.  ***   Murray Hodgkins, NP 12/06/2022, 1:50 PM

## 2022-12-07 ENCOUNTER — Encounter: Payer: Self-pay | Admitting: Nurse Practitioner

## 2022-12-21 DEATH — deceased

## 2023-03-07 ENCOUNTER — Ambulatory Visit: Payer: Medicare Other | Admitting: Podiatry
